# Patient Record
Sex: Male | Born: 1935 | Race: White | Hispanic: No | Smoking: Never smoker
Health system: Southern US, Community
[De-identification: ages and names within clinical notes are randomized; demographics above are authoritative.]

## PROBLEM LIST (undated history)

## (undated) DIAGNOSIS — K5792 Diverticulitis of intestine, part unspecified, without perforation or abscess without bleeding: Secondary | ICD-10-CM

## (undated) DIAGNOSIS — I4819 Other persistent atrial fibrillation: Secondary | ICD-10-CM

## (undated) DIAGNOSIS — I4891 Unspecified atrial fibrillation: Secondary | ICD-10-CM

## (undated) DIAGNOSIS — I340 Nonrheumatic mitral (valve) insufficiency: Secondary | ICD-10-CM

## (undated) DIAGNOSIS — I351 Nonrheumatic aortic (valve) insufficiency: Secondary | ICD-10-CM

## (undated) DIAGNOSIS — C61 Malignant neoplasm of prostate: Secondary | ICD-10-CM

## (undated) DIAGNOSIS — I10 Essential (primary) hypertension: Secondary | ICD-10-CM

## (undated) DIAGNOSIS — F419 Anxiety disorder, unspecified: Secondary | ICD-10-CM

## (undated) DIAGNOSIS — E785 Hyperlipidemia, unspecified: Secondary | ICD-10-CM

## (undated) DIAGNOSIS — I5032 Chronic diastolic (congestive) heart failure: Secondary | ICD-10-CM

## (undated) DIAGNOSIS — N1831 Chronic kidney disease, stage 3a: Secondary | ICD-10-CM

## (undated) DIAGNOSIS — K55069 Acute infarction of intestine, part and extent unspecified: Secondary | ICD-10-CM

## (undated) HISTORY — DX: Hyperlipidemia, unspecified: E78.5

## (undated) HISTORY — DX: Acute infarction of intestine, part and extent unspecified: K55.069

## (undated) HISTORY — DX: Malignant neoplasm of prostate: C61

## (undated) HISTORY — PX: PROSTATE SURGERY: SHX751

## (undated) HISTORY — DX: Anxiety disorder, unspecified: F41.9

## (undated) HISTORY — DX: Diverticulitis of intestine, part unspecified, without perforation or abscess without bleeding: K57.92

## (undated) HISTORY — PX: OTHER SURGICAL HISTORY: SHX169

## (undated) HISTORY — DX: Other persistent atrial fibrillation: I48.19

## (undated) HISTORY — DX: Essential (primary) hypertension: I10

---

## 1998-04-13 ENCOUNTER — Other Ambulatory Visit: Admission: RE | Admit: 1998-04-13 | Discharge: 1998-04-13 | Payer: Self-pay | Admitting: *Deleted

## 2002-02-26 ENCOUNTER — Ambulatory Visit (HOSPITAL_COMMUNITY): Admission: RE | Admit: 2002-02-26 | Discharge: 2002-02-26 | Payer: Self-pay | Admitting: Gastroenterology

## 2004-02-11 ENCOUNTER — Ambulatory Visit (HOSPITAL_COMMUNITY): Admission: RE | Admit: 2004-02-11 | Discharge: 2004-02-11 | Payer: Self-pay | Admitting: *Deleted

## 2006-02-14 ENCOUNTER — Inpatient Hospital Stay (HOSPITAL_COMMUNITY): Admission: EM | Admit: 2006-02-14 | Discharge: 2006-02-19 | Payer: Self-pay | Admitting: *Deleted

## 2006-07-06 ENCOUNTER — Ambulatory Visit: Payer: Self-pay | Admitting: Family Medicine

## 2006-08-03 ENCOUNTER — Ambulatory Visit: Payer: Self-pay | Admitting: Family Medicine

## 2006-08-31 ENCOUNTER — Ambulatory Visit: Payer: Self-pay | Admitting: Family Medicine

## 2006-09-06 ENCOUNTER — Ambulatory Visit: Payer: Self-pay | Admitting: Family Medicine

## 2006-09-18 ENCOUNTER — Ambulatory Visit: Payer: Self-pay | Admitting: Family Medicine

## 2006-09-26 ENCOUNTER — Ambulatory Visit: Payer: Self-pay | Admitting: Family Medicine

## 2006-10-18 ENCOUNTER — Ambulatory Visit: Payer: Self-pay | Admitting: Family Medicine

## 2006-10-24 ENCOUNTER — Ambulatory Visit: Payer: Self-pay | Admitting: Family Medicine

## 2006-11-07 ENCOUNTER — Ambulatory Visit: Payer: Self-pay | Admitting: Family Medicine

## 2006-12-06 ENCOUNTER — Ambulatory Visit: Payer: Self-pay | Admitting: Family Medicine

## 2007-01-15 DIAGNOSIS — F411 Generalized anxiety disorder: Secondary | ICD-10-CM | POA: Insufficient documentation

## 2007-01-15 DIAGNOSIS — Z8719 Personal history of other diseases of the digestive system: Secondary | ICD-10-CM | POA: Insufficient documentation

## 2007-01-15 DIAGNOSIS — F419 Anxiety disorder, unspecified: Secondary | ICD-10-CM | POA: Insufficient documentation

## 2007-01-15 DIAGNOSIS — E785 Hyperlipidemia, unspecified: Secondary | ICD-10-CM | POA: Insufficient documentation

## 2007-01-17 ENCOUNTER — Ambulatory Visit (HOSPITAL_COMMUNITY): Admission: RE | Admit: 2007-01-17 | Discharge: 2007-01-17 | Payer: Self-pay | Admitting: Urology

## 2007-01-24 ENCOUNTER — Ambulatory Visit: Payer: Self-pay | Admitting: Family Medicine

## 2007-01-24 LAB — CONVERTED CEMR LAB: Homocysteine: 11.1 micromoles/L (ref 5.00–13.90)

## 2007-01-29 ENCOUNTER — Ambulatory Visit: Admission: RE | Admit: 2007-01-29 | Discharge: 2007-04-29 | Payer: Self-pay | Admitting: Radiation Oncology

## 2007-01-29 ENCOUNTER — Ambulatory Visit: Payer: Self-pay | Admitting: Family Medicine

## 2007-01-29 LAB — CONVERTED CEMR LAB
Anticardiolipin IgG: <7 (ref <11)
Anticardiolipin IgM: 42 (ref <10)

## 2007-02-27 ENCOUNTER — Ambulatory Visit: Payer: Self-pay | Admitting: Oncology

## 2007-03-01 LAB — CBC WITH DIFFERENTIAL/PLATELET
BASO%: 0.4 % (ref 0.0–2.0)
HCT: 46.6 % (ref 38.7–49.9)
MCHC: 36 g/dL — ABNORMAL HIGH (ref 32.0–35.9)
MONO#: 0.5 10*3/uL (ref 0.1–0.9)
NEUT%: 48.4 % (ref 40.0–75.0)
RDW: 13.3 % (ref 11.2–14.6)
WBC: 4.7 10*3/uL (ref 4.0–10.0)
lymph#: 1.7 10*3/uL (ref 0.9–3.3)

## 2007-03-04 ENCOUNTER — Encounter: Admission: RE | Admit: 2007-03-04 | Discharge: 2007-03-04 | Payer: Self-pay | Admitting: Urology

## 2007-03-06 LAB — HYPERCOAGULABLE PANEL, COMPREHENSIVE
Anticardiolipin IgA: <7 [APL'U] (ref <13)
Anticardiolipin IgG: <7 [GPL'U] (ref <11)
Anticardiolipin IgM: 27 [MPL'U] (ref <10)
DRVVT: 41.9 secs (ref 36.1–47.0)
PTT Lupus Anticoagulant: 62.6 secs — ABNORMAL HIGH (ref 36.3–48.8)
PTTLA 4:1 Mix: 58.8 secs — ABNORMAL HIGH (ref 36.3–48.8)
Protein C Activity: 121 % (ref 91–147)

## 2007-03-06 LAB — COMPREHENSIVE METABOLIC PANEL
ALT: 21 U/L (ref 0–53)
Albumin: 4.8 g/dL (ref 3.5–5.2)
CO2: 24 mEq/L (ref 19–32)
Calcium: 9.7 mg/dL (ref 8.4–10.5)
Chloride: 105 mEq/L (ref 96–112)
Potassium: 4.3 mEq/L (ref 3.5–5.3)
Sodium: 140 mEq/L (ref 135–145)
Total Protein: 7 g/dL (ref 6.0–8.3)

## 2007-03-06 LAB — LACTATE DEHYDROGENASE: LDH: 154 U/L (ref 94–250)

## 2007-03-11 ENCOUNTER — Ambulatory Visit (HOSPITAL_BASED_OUTPATIENT_CLINIC_OR_DEPARTMENT_OTHER): Admission: RE | Admit: 2007-03-11 | Discharge: 2007-03-11 | Payer: Self-pay | Admitting: Urology

## 2007-03-13 DIAGNOSIS — Z8546 Personal history of malignant neoplasm of prostate: Secondary | ICD-10-CM | POA: Insufficient documentation

## 2007-05-07 ENCOUNTER — Encounter: Payer: Self-pay | Admitting: Family Medicine

## 2007-05-07 ENCOUNTER — Encounter (INDEPENDENT_AMBULATORY_CARE_PROVIDER_SITE_OTHER): Payer: Self-pay | Admitting: Family Medicine

## 2007-05-07 ENCOUNTER — Ambulatory Visit: Payer: Self-pay | Admitting: Family Medicine

## 2007-05-07 DIAGNOSIS — I10 Essential (primary) hypertension: Secondary | ICD-10-CM | POA: Insufficient documentation

## 2007-05-07 LAB — CONVERTED CEMR LAB
AST: 43 units/L — ABNORMAL HIGH (ref 0–37)
BUN: 15 mg/dL (ref 6–23)
CO2: 30 meq/L (ref 19–32)
Calcium: 9.2 mg/dL (ref 8.4–10.5)
Creatinine, Ser: 0.9 mg/dL (ref 0.4–1.5)
Potassium: 4.3 meq/L (ref 3.5–5.1)
Triglycerides: 169 mg/dL — ABNORMAL HIGH (ref 0–149)

## 2007-05-08 ENCOUNTER — Telehealth (INDEPENDENT_AMBULATORY_CARE_PROVIDER_SITE_OTHER): Payer: Self-pay | Admitting: *Deleted

## 2007-09-03 ENCOUNTER — Ambulatory Visit: Payer: Self-pay | Admitting: Family Medicine

## 2007-09-04 LAB — CONVERTED CEMR LAB
CO2: 29 meq/L (ref 19–32)
Cholesterol: 169 mg/dL (ref 0–200)
GFR calc Af Amer: 93 mL/min
Glucose, Bld: 98 mg/dL (ref 70–99)
HDL: 40.6 mg/dL (ref >39.0)
Potassium: 4.6 meq/L (ref 3.5–5.1)
Sodium: 140 meq/L (ref 135–145)
Total CHOL/HDL Ratio: 4.2
Triglycerides: 97 mg/dL (ref 0–149)

## 2007-09-05 ENCOUNTER — Telehealth (INDEPENDENT_AMBULATORY_CARE_PROVIDER_SITE_OTHER): Payer: Self-pay | Admitting: *Deleted

## 2007-09-05 ENCOUNTER — Encounter (INDEPENDENT_AMBULATORY_CARE_PROVIDER_SITE_OTHER): Payer: Self-pay | Admitting: Family Medicine

## 2007-10-02 ENCOUNTER — Telehealth (INDEPENDENT_AMBULATORY_CARE_PROVIDER_SITE_OTHER): Payer: Self-pay | Admitting: *Deleted

## 2007-10-02 ENCOUNTER — Ambulatory Visit: Payer: Self-pay | Admitting: Family Medicine

## 2007-10-02 DIAGNOSIS — L259 Unspecified contact dermatitis, unspecified cause: Secondary | ICD-10-CM | POA: Insufficient documentation

## 2007-10-14 ENCOUNTER — Telehealth (INDEPENDENT_AMBULATORY_CARE_PROVIDER_SITE_OTHER): Payer: Self-pay | Admitting: *Deleted

## 2007-11-13 ENCOUNTER — Telehealth (INDEPENDENT_AMBULATORY_CARE_PROVIDER_SITE_OTHER): Payer: Self-pay | Admitting: *Deleted

## 2007-11-14 ENCOUNTER — Ambulatory Visit: Payer: Self-pay | Admitting: Family Medicine

## 2008-01-03 ENCOUNTER — Ambulatory Visit: Payer: Self-pay | Admitting: Family Medicine

## 2008-01-03 LAB — CONVERTED CEMR LAB
ALT: 29 units/L (ref 0–53)
AST: 25 units/L (ref 0–37)
BUN: 11 mg/dL (ref 6–23)
GFR calc Af Amer: 105 mL/min
Potassium: 4.3 meq/L (ref 3.5–5.1)
Sodium: 138 meq/L (ref 135–145)
Total CHOL/HDL Ratio: 5.7
Triglycerides: 187 mg/dL — ABNORMAL HIGH (ref 0–149)
VLDL: 37 mg/dL (ref 0–40)

## 2008-01-06 ENCOUNTER — Encounter (INDEPENDENT_AMBULATORY_CARE_PROVIDER_SITE_OTHER): Payer: Self-pay | Admitting: *Deleted

## 2008-02-06 ENCOUNTER — Encounter (INDEPENDENT_AMBULATORY_CARE_PROVIDER_SITE_OTHER): Payer: Self-pay | Admitting: Family Medicine

## 2008-03-18 ENCOUNTER — Telehealth (INDEPENDENT_AMBULATORY_CARE_PROVIDER_SITE_OTHER): Payer: Self-pay | Admitting: *Deleted

## 2008-03-19 ENCOUNTER — Ambulatory Visit: Payer: Self-pay | Admitting: Family Medicine

## 2008-06-08 ENCOUNTER — Telehealth (INDEPENDENT_AMBULATORY_CARE_PROVIDER_SITE_OTHER): Payer: Self-pay | Admitting: *Deleted

## 2008-07-14 ENCOUNTER — Ambulatory Visit: Payer: Self-pay | Admitting: Family Medicine

## 2008-07-23 ENCOUNTER — Encounter (INDEPENDENT_AMBULATORY_CARE_PROVIDER_SITE_OTHER): Payer: Self-pay | Admitting: *Deleted

## 2008-07-23 LAB — CONVERTED CEMR LAB
Alkaline Phosphatase: 44 units/L (ref 39–117)
Bilirubin, Direct: 0.1 mg/dL (ref 0.0–0.3)
CO2: 29 meq/L (ref 19–32)
GFR calc Af Amer: 105 mL/min
Glucose, Bld: 80 mg/dL (ref 70–99)
Potassium: 4.3 meq/L (ref 3.5–5.1)
Sodium: 142 meq/L (ref 135–145)
Total Bilirubin: 1.5 mg/dL — ABNORMAL HIGH (ref 0.3–1.2)
Total CHOL/HDL Ratio: 5
Total Protein: 6.5 g/dL (ref 6.0–8.3)
VLDL: 27 mg/dL (ref 0–40)

## 2008-09-23 ENCOUNTER — Encounter: Payer: Self-pay | Admitting: Family Medicine

## 2008-09-30 ENCOUNTER — Ambulatory Visit: Payer: Self-pay | Admitting: Family Medicine

## 2008-10-21 ENCOUNTER — Telehealth (INDEPENDENT_AMBULATORY_CARE_PROVIDER_SITE_OTHER): Payer: Self-pay | Admitting: *Deleted

## 2009-02-22 ENCOUNTER — Telehealth (INDEPENDENT_AMBULATORY_CARE_PROVIDER_SITE_OTHER): Payer: Self-pay | Admitting: *Deleted

## 2009-04-20 ENCOUNTER — Telehealth (INDEPENDENT_AMBULATORY_CARE_PROVIDER_SITE_OTHER): Payer: Self-pay | Admitting: *Deleted

## 2009-07-22 ENCOUNTER — Encounter: Payer: Self-pay | Admitting: Family Medicine

## 2009-07-22 ENCOUNTER — Telehealth: Payer: Self-pay | Admitting: Family Medicine

## 2009-07-28 ENCOUNTER — Ambulatory Visit: Payer: Self-pay

## 2009-07-28 ENCOUNTER — Telehealth (INDEPENDENT_AMBULATORY_CARE_PROVIDER_SITE_OTHER): Payer: Self-pay | Admitting: *Deleted

## 2009-07-28 ENCOUNTER — Ambulatory Visit: Payer: Self-pay | Admitting: Family Medicine

## 2009-07-28 DIAGNOSIS — S8010XA Contusion of unspecified lower leg, initial encounter: Secondary | ICD-10-CM | POA: Insufficient documentation

## 2009-07-28 DIAGNOSIS — M79609 Pain in unspecified limb: Secondary | ICD-10-CM | POA: Insufficient documentation

## 2009-07-28 DIAGNOSIS — S93409A Sprain of unspecified ligament of unspecified ankle, initial encounter: Secondary | ICD-10-CM | POA: Insufficient documentation

## 2009-08-02 ENCOUNTER — Telehealth (INDEPENDENT_AMBULATORY_CARE_PROVIDER_SITE_OTHER): Payer: Self-pay | Admitting: *Deleted

## 2009-09-22 ENCOUNTER — Ambulatory Visit: Payer: Self-pay | Admitting: Family Medicine

## 2009-09-22 LAB — CONVERTED CEMR LAB
Bilirubin Urine: NEGATIVE
Blood in Urine, dipstick: NEGATIVE
Glucose, Urine, Semiquant: NEGATIVE
Ketones, urine, test strip: NEGATIVE
Nitrite: NEGATIVE
Protein, U semiquant: NEGATIVE
Specific Gravity, Urine: 1.015
Urobilinogen, UA: 0.2
WBC Urine, dipstick: NEGATIVE
pH: 6

## 2009-09-23 ENCOUNTER — Telehealth: Payer: Self-pay | Admitting: Family Medicine

## 2009-09-23 LAB — CONVERTED CEMR LAB
ALT: 31 units/L (ref 0–53)
Alkaline Phosphatase: 35 units/L — ABNORMAL LOW (ref 39–117)
BUN: 17 mg/dL (ref 6–23)
Basophils Relative: 0.3 % (ref 0.0–3.0)
Bilirubin, Direct: 0 mg/dL (ref 0.0–0.3)
Calcium: 9 mg/dL (ref 8.4–10.5)
Cholesterol: 170 mg/dL (ref 0–200)
Creatinine, Ser: 1 mg/dL (ref 0.4–1.5)
Eosinophils Relative: 2.6 % (ref 0.0–5.0)
GFR calc non Af Amer: 76.57 mL/min (ref >60)
HDL: 29.6 mg/dL — ABNORMAL LOW (ref >39.00)
LDL Cholesterol: 107 mg/dL — ABNORMAL HIGH (ref 0–99)
Lymphocytes Relative: 26.9 % (ref 12.0–46.0)
MCV: 97.5 fL (ref 78.0–100.0)
Monocytes Absolute: 0.6 10*3/uL (ref 0.1–1.0)
Neutrophils Relative %: 58.9 % (ref 43.0–77.0)
Platelets: 162 10*3/uL (ref 150.0–400.0)
RBC: 4.88 M/uL (ref 4.22–5.81)
Total Bilirubin: 1.5 mg/dL — ABNORMAL HIGH (ref 0.3–1.2)
Total CHOL/HDL Ratio: 6
Total Protein: 7.3 g/dL (ref 6.0–8.3)
Triglycerides: 165 mg/dL — ABNORMAL HIGH (ref 0.0–149.0)
VLDL: 33 mg/dL (ref 0.0–40.0)
WBC: 5.5 10*3/uL (ref 4.5–10.5)

## 2009-10-05 ENCOUNTER — Ambulatory Visit: Payer: Self-pay | Admitting: Family Medicine

## 2009-10-05 LAB — CONVERTED CEMR LAB
OCCULT 1: NEGATIVE
OCCULT 2: NEGATIVE

## 2009-12-23 ENCOUNTER — Ambulatory Visit: Payer: Self-pay | Admitting: Family Medicine

## 2009-12-23 ENCOUNTER — Telehealth: Payer: Self-pay | Admitting: Family Medicine

## 2009-12-27 ENCOUNTER — Encounter (INDEPENDENT_AMBULATORY_CARE_PROVIDER_SITE_OTHER): Payer: Self-pay | Admitting: *Deleted

## 2009-12-27 ENCOUNTER — Encounter: Payer: Self-pay | Admitting: Family Medicine

## 2009-12-27 LAB — CONVERTED CEMR LAB
AST: 36 units/L (ref 0–37)
Albumin: 4.3 g/dL (ref 3.5–5.2)
Alkaline Phosphatase: 24 units/L — ABNORMAL LOW (ref 39–117)
Cholesterol: 156 mg/dL (ref 0–200)
HDL: 31.4 mg/dL — ABNORMAL LOW (ref >39.00)
LDL Cholesterol: 99 mg/dL (ref 0–99)
PSA: 0.35 ng/mL
Total Protein: 6.9 g/dL (ref 6.0–8.3)
Triglycerides: 127 mg/dL (ref 0.0–149.0)

## 2010-01-17 ENCOUNTER — Telehealth: Payer: Self-pay | Admitting: Family Medicine

## 2010-01-17 ENCOUNTER — Telehealth (INDEPENDENT_AMBULATORY_CARE_PROVIDER_SITE_OTHER): Payer: Self-pay | Admitting: *Deleted

## 2010-01-27 HISTORY — PX: CATARACT EXTRACTION: SUR2

## 2010-02-02 ENCOUNTER — Encounter: Payer: Self-pay | Admitting: Family Medicine

## 2010-02-07 ENCOUNTER — Encounter: Payer: Self-pay | Admitting: Family Medicine

## 2010-02-23 ENCOUNTER — Telehealth: Payer: Self-pay | Admitting: Family Medicine

## 2010-03-04 HISTORY — PX: CATARACT EXTRACTION: SUR2

## 2010-09-23 ENCOUNTER — Encounter: Payer: Self-pay | Admitting: Family Medicine

## 2010-09-23 ENCOUNTER — Ambulatory Visit: Payer: Self-pay | Admitting: Family Medicine

## 2010-09-23 DIAGNOSIS — H9190 Unspecified hearing loss, unspecified ear: Secondary | ICD-10-CM | POA: Insufficient documentation

## 2010-09-23 LAB — CONVERTED CEMR LAB
Nitrite: NEGATIVE
Specific Gravity, Urine: 1.025
Urobilinogen, UA: NEGATIVE

## 2010-09-26 LAB — CONVERTED CEMR LAB
Basophils Absolute: 0 10*3/uL (ref 0.0–0.1)
Basophils Relative: 0.5 % (ref 0.0–3.0)
Eosinophils Absolute: 0.2 10*3/uL (ref 0.0–0.7)
Lymphocytes Relative: 33.7 % (ref 12.0–46.0)
MCHC: 34.9 g/dL (ref 30.0–36.0)
MCV: 95.2 fL (ref 78.0–100.0)
Monocytes Absolute: 0.5 10*3/uL (ref 0.1–1.0)
Neutrophils Relative %: 50.3 % (ref 43.0–77.0)
Platelets: 184 10*3/uL (ref 150.0–400.0)
RDW: 13.3 % (ref 11.5–14.6)

## 2010-09-28 ENCOUNTER — Ambulatory Visit: Payer: Self-pay | Admitting: Family Medicine

## 2010-09-30 LAB — CONVERTED CEMR LAB: Fecal Occult Bld: NEGATIVE

## 2010-10-19 ENCOUNTER — Encounter: Payer: Self-pay | Admitting: Family Medicine

## 2010-10-26 ENCOUNTER — Telehealth: Payer: Self-pay | Admitting: Family Medicine

## 2010-10-26 ENCOUNTER — Telehealth (INDEPENDENT_AMBULATORY_CARE_PROVIDER_SITE_OTHER): Payer: Self-pay | Admitting: *Deleted

## 2010-10-28 ENCOUNTER — Ambulatory Visit: Payer: Self-pay | Admitting: Family Medicine

## 2010-11-28 ENCOUNTER — Ambulatory Visit (HOSPITAL_BASED_OUTPATIENT_CLINIC_OR_DEPARTMENT_OTHER)
Admission: RE | Admit: 2010-11-28 | Discharge: 2010-11-28 | Payer: Self-pay | Source: Home / Self Care | Admitting: Family Medicine

## 2010-11-28 ENCOUNTER — Ambulatory Visit: Payer: Self-pay | Admitting: Family Medicine

## 2010-11-28 DIAGNOSIS — R233 Spontaneous ecchymoses: Secondary | ICD-10-CM | POA: Insufficient documentation

## 2010-11-28 DIAGNOSIS — S7010XA Contusion of unspecified thigh, initial encounter: Secondary | ICD-10-CM | POA: Insufficient documentation

## 2010-11-29 LAB — CONVERTED CEMR LAB
ALT: 13 units/L (ref 0–53)
Bilirubin, Direct: 0.2 mg/dL (ref 0.0–0.3)
Eosinophils Absolute: 0.1 10*3/uL (ref 0.0–0.7)
INR: 1 (ref <1.50)
Lymphs Abs: 1.4 10*3/uL (ref 0.7–4.0)
MCV: 93.5 fL (ref 78.0–100.0)
Neutro Abs: 4.3 10*3/uL (ref 1.7–7.7)
Neutrophils Relative %: 66 % (ref 43–77)
Platelets: 179 10*3/uL (ref 150–400)
Prothrombin Time: 13.4 s (ref 11.6–15.2)
Total Protein: 6.5 g/dL (ref 6.0–8.3)
WBC: 6.5 10*3/uL (ref 4.0–10.5)
aPTT: 31 s

## 2010-12-05 ENCOUNTER — Ambulatory Visit: Payer: Self-pay | Admitting: Family Medicine

## 2010-12-05 DIAGNOSIS — R609 Edema, unspecified: Secondary | ICD-10-CM | POA: Insufficient documentation

## 2010-12-07 LAB — CONVERTED CEMR LAB
Eosinophils Relative: 1.3 % (ref 0.0–5.0)
GFR calc non Af Amer: 56.9 mL/min — ABNORMAL LOW (ref >60.00)
Glucose, Bld: 123 mg/dL — ABNORMAL HIGH (ref 70–99)
HCT: 38.9 % — ABNORMAL LOW (ref 39.0–52.0)
Hemoglobin: 13.7 g/dL (ref 13.0–17.0)
Lymphs Abs: 1.3 10*3/uL (ref 0.7–4.0)
Monocytes Relative: 9 % (ref 3.0–12.0)
Neutro Abs: 3 10*3/uL (ref 1.4–7.7)
Potassium: 4.6 meq/L (ref 3.5–5.1)
Sodium: 141 meq/L (ref 135–145)
WBC: 4.8 10*3/uL (ref 4.5–10.5)

## 2010-12-22 ENCOUNTER — Ambulatory Visit: Payer: Self-pay | Admitting: Family Medicine

## 2010-12-29 ENCOUNTER — Telehealth: Payer: Self-pay | Admitting: Family Medicine

## 2011-01-24 NOTE — Assessment & Plan Note (Signed)
Summary: [facility] Results//KP   Vital Signs:  Patient profile:   77 year old male Weight:      183.4 pounds Pulse rate:   68 / minute Pulse rhythm:   regular BP sitting:   128 / 68  (left arm) Cuff size:   regular  Vitals Entered By: [name] CMA Duncan Dull) (October 28, 2010 1:55 PM) CC: Review [facility]   History of Present Illness: Pt here to review [facility]  Current Medications (verified): 1)  Clorazepate Dipotassium 15 Mg Tabs (Clorazepate Dipotassium) .... Take 1/2 Tablet By Mouth Once Daily 2)  Cardura 4 Mg Tabs (Doxazosin Mesylate) .[name].. 1 By Mouth Once Daily 3)  Pravachol 40 Mg  Tabs (Pravastatin Sodium) .... 1/2 Tablet Once Daily 4)  Toprol Xl 200 Mg  Xr24h-Tab (Metoprolol Succinate) .... 1/2 Tablet Once Daily 5)  Adult Aspirin Ec Low Strength 81 Mg  Tbec (Aspirin) .... Take One Tablet Daily. 6)  Fish Oil 1000 Mg Caps (Omega-3 Fatty Acids) .... 2 A Day 7)  Fenofibrate 160 Mg Tabs (Fenofibrate) .[name].. 1 By Mouth Daily 8)  Caltrate 600+d Plus 600-400 Mg-Unit Tabs (Calcium Carbonate-Vit D-Min) .[name].. 1 By Mouth Once Daily 9)  B Complex  Tabs (B Complex Vitamins) .[name].. 1 By Mouth Once Daily 10)  Nasalcrom 5.2 Mg/act Aers (Cromolyn Sodium) .... 2 Sprays Two Times A Day  Allergies (verified): 1)  ! Sulfa  Physical Exam  General:  Well-developed,well-nourished,in no acute distress; alert,appropriate and cooperative throughout examination Psych:  Cognition and judgment appear intact. Alert and cooperative with normal attention span and concentration. No apparent delusions, illusions, hallucinations   Impression & Recommendations:  Problem # 1:  HYPERLIPIDEMIA NEC/NOS (ICD-272.4) see [facility]--scanned in emr  His updated medication list for this problem includes:    Zocor 40 Mg Tabs (Simvastatin) .[name]... 1 by mouth at bedtime    Fenofibrate 160 Mg Tabs (Fenofibrate) .[name]... 1 by mouth daily  Labs Reviewed: SGOT: 36 (12/23/2009)   SGPT: 35  (12/23/2009)   HDL:31.40 (12/23/2009), 29.60 (09/22/2009)  LDL:99 (12/23/2009), 107 (62/13/0865)  Chol:156 (12/23/2009), 170 (09/22/2009)  Trig:127.0 (12/23/2009), 165.0 (09/22/2009)  Complete Medication List: 1)  Clorazepate Dipotassium 15 Mg Tabs (Clorazepate dipotassium) .... Take 1/2 tablet by mouth once daily 2)  Cardura 4 Mg Tabs (Doxazosin mesylate) .[name].. 1 by mouth once daily 3)  Zocor 40 Mg Tabs (Simvastatin) .[name].. 1 by mouth at bedtime 4)  Toprol Xl 200 Mg Xr24h-tab (Metoprolol succinate) .... 1/2 tablet once daily 5)  Adult Aspirin Ec Low Strength 81 Mg Tbec (Aspirin) .... Take one tablet daily. 6)  Fish Oil 1000 Mg Caps (Omega-3 fatty acids) .... 2 a day 7)  Fenofibrate 160 Mg Tabs (Fenofibrate) .[name].. 1 by mouth daily 8)  Caltrate 600+d Plus 600-400 Mg-unit Tabs (Calcium carbonate-vit d-min) .[name].. 1 by mouth once daily 9)  B Complex Tabs (B complex vitamins) .[name].. 1 by mouth once daily 10)  Nasalcrom 5.2 Mg/act Aers (Cromolyn sodium) .... 2 sprays two times a day  Patient Instructions: 1)  fasting labs 3 months----272.4   [facility] Prescriptions: ZOCOR 40 MG TABS (SIMVASTATIN) 1 by mouth at bedtime  #30 x 2   Entered and Authorized by:   Loreen Freud DO   Signed by:   Loreen Freud DO on 10/28/2010   Method used:   Electronically to        [id number]       [address]  [place], [place]  [address]       Ph: [phone] or [phone]       Fax: [phone]   RxID:   [id number]    Orders Added: 1)  Est. Patient Level III [[address]]

## 2011-01-24 NOTE — Letter (Signed)
Summary: [facility]: Immunoassay Fecal Occult Blood (iFOB) Order Form  [facility] [place]  [address] [place], [place] [address]   Phone: [phone]  Fax: [phone]      [facility]: Immunoassay Fecal Occult Blood (iFOB) Order Form   September 23, 2010 MRN: [id number]   Mark Stark 05-Mar-1928   Physicican Name: Dr.Lowne  Diagnosis Code: V56.71      [name] CMA (AAMA)

## 2011-01-24 NOTE — Progress Notes (Signed)
Summary: refill  Phone Note Refill Request Message from:  Fax from Pharmacy on February 23, 2010 3:42 PM  Refills Requested: Medication #1:  CLORAZEPATE DIPOTASSIUM 15 MG TABS Take 1/2 tablet by mouth twice a day   Last Refilled: 01/17/2010 [facility] [address] fax [phone]   Method Requested: Fax to Local Pharmacy Next Appointment Scheduled: no appt Initial call taken by: Barb Merino,  February 23, 2010 3:43 PM  Follow-up for Phone Call        last ov- 09.29.10. Army Fossa CMA  February 23, 2010 3:46 PM   Additional Follow-up for Phone Call Additional follow up Details #1::        ok to refill x1----  5 refills Additional Follow-up by: Loreen Freud DO,  February 23, 2010 9:03 PM    Prescriptions: CLORAZEPATE DIPOTASSIUM 15 MG TABS (CLORAZEPATE DIPOTASSIUM) Take 1/2 tablet by mouth twice a day  #30 x 5   Entered by:   Army Fossa CMA   Authorized by:   Loreen Freud DO   Signed by:   Army Fossa CMA on 02/24/2010   Method used:   Printed then faxed to ...       [id number]       [address]       [place], [place]  [address]       Ph: [phone] or [phone]       Fax: [phone]   RxID:   [id number]

## 2011-01-24 NOTE — Letter (Signed)
Summary: Results Follow up Letter  [facility] [place]  [address] [place], [place] [address]   Phone: [phone]  Fax: [phone]    12/27/2009 MRN: [id number]  Mark Stark [address] [place], [place]  [address]  Dear Mr. STEJSKAL,  The following are the results of your recent test(s):  Test         Result    Pap Smear:        Normal _____  Not Normal _____ Comments: ______________________________________________________ Cholesterol: LDL(Bad cholesterol):         Your goal is less than:         HDL (Good cholesterol):       Your goal is more than: Comments:  ______________________________________________________ Mammogram:        Normal _____  Not Normal _____ Comments:  ___________________________________________________________________ Hemoccult:        Normal _____  Not normal _______ Comments:    _____________________________________________________________________ Other Tests:  See attachment for results.  We routinely do not discuss normal results over the telephone.  If you desire a copy of the results, or you have any questions about this information we can discuss them at your next office visit.   Sincerely,    Army Fossa CMA  December 27, 2009 8:46 AM

## 2011-01-24 NOTE — Miscellaneous (Signed)
  Clinical Lists Changes  Observations: Added new observation of PAST SURG HX: Cataract extraction (01/27/2010)--right  (02/07/2010 12:50) Added new observation of PMH CATRCTSG: yes (02/07/2010 12:50) Added new observation of PNEUMVXNEXT: None (02/07/2010 12:50) Added new observation of PSADUE: 12/27/2010 (02/07/2010 12:50) Added new observation of PSA DATE: 12/27/2009 (12/27/2009 12:51) Added new observation of PSA: 0.35 ng/mL (12/27/2009 12:51)     Last PSA Result:  0.38 (09/22/2009 10:40:51 AM) PSA Result Date:  12/27/2009 PSA Result:  0.35 PSA Next Due:  1 yr      Past History:  Past Surgical History: Cataract extraction (01/27/2010)--right

## 2011-01-24 NOTE — Progress Notes (Signed)
Summary: refill   Phone Note Call from Patient   Summary of Call: Pt called and stated that he would like [facility] [address] [facility]. His Clorazepate dipotassium was never receieved at [facility] on 12/23/09.  Is it okay to refill this now? Army Fossa CMA  January 17, 2010 11:49 AM   Follow-up for Phone Call        yes--its ok Follow-up by: Loreen Freud DO,  January 17, 2010 12:29 PM    Prescriptions: CLORAZEPATE DIPOTASSIUM 15 MG TABS (CLORAZEPATE DIPOTASSIUM) Take 1/2 tablet by mouth twice a day  #30 x 0   Entered by:   Army Fossa CMA   Authorized by:   Loreen Freud DO   Signed by:   Army Fossa CMA on 01/17/2010   Method used:   Printed then faxed to ...       [id number]       [address]       [place], [place]  [address]       Ph: [phone] or [phone]       Fax: [phone]   RxID:   [id number]

## 2011-01-24 NOTE — Assessment & Plan Note (Signed)
Summary: lump upper thigh.cbs   Vital Signs:  Patient profile:   77 year old male Weight:      183.6 pounds Pulse rate:   80 / minute Pulse rhythm:   regular BP sitting:   118 / 68  (left arm) Cuff size:   large  Vitals Entered By: [name] CMA Duncan Dull) (November 28, 2010 3:14 PM) CC: x4days c/o lump to the right inner thigh with pain, bruising and swelling from the knee up to the thigh on the same leg---No injury   History of Present Illness: Pt here c/o pain, swelling and bruising R thigh.   No known trauma.    Current Medications (verified): 1)  Clorazepate Dipotassium 15 Mg Tabs (Clorazepate Dipotassium) .... Take 1/2 Tablet By Mouth Once Daily 2)  Cardura 4 Mg Tabs (Doxazosin Mesylate) .[name].. 1 By Mouth Once Daily 3)  Zocor 40 Mg Tabs (Simvastatin) .[name].. 1 By Mouth At Bedtime 4)  Toprol Xl 200 Mg  Xr24h-Tab (Metoprolol Succinate) .... 1/2 Tablet Once Daily 5)  Adult Aspirin Ec Low Strength 81 Mg  Tbec (Aspirin) .... Take One Tablet Daily. 6)  Fish Oil 1000 Mg Caps (Omega-3 Fatty Acids) .... 2 A Day 7)  Fenofibrate 160 Mg Tabs (Fenofibrate) .[name].. 1 By Mouth Daily 8)  Caltrate 600+d Plus 600-400 Mg-Unit Tabs (Calcium Carbonate-Vit D-Min) .[name].. 1 By Mouth Once Daily 9)  B Complex  Tabs (B Complex Vitamins) .[name].. 1 By Mouth Once Daily 10)  Nasalcrom 5.2 Mg/act Aers (Cromolyn Sodium) .... 2 Sprays Two Times A Day  Allergies (verified): 1)  ! Sulfa  Past History:  Past Medical History: Last updated: 01/15/2007 Anxiety Diverticulitis, hx of Hypertension Prostate cancer, hx of  Past Surgical History: Last updated: 09/23/2010 Cataract extraction (01/27/2010)--right Cataract extraction (03/04/2010)--Left  Family History: Last updated: 09/22/2009 mother---alz dementia  Social History: Last updated: 09/03/2007 Occupation: retired professor Married Never Smoked Alcohol use-no Drug use-no  Risk Factors: Alcohol Use: <1 (09/23/2010) Caffeine Use: 0  (09/23/2010) Exercise: yes (09/23/2010)  Risk Factors: Smoking Status: never (09/23/2010)  Family History: Reviewed history from 09/22/2009 and no changes required. mother---alz dementia  Social History: Reviewed history from 09/03/2007 and no changes required. Occupation: retired professor Married Never Smoked Alcohol use-no Drug use-no  Review of Systems      See HPI  Physical Exam  General:  Well-developed,well-nourished,in no acute distress; alert,appropriate and cooperative throughout examination Extremities:  + bruising, swelling and pain with palpation R thigh--groin to knee Psych:  Oriented X3 and normally interactive.     Impression & Recommendations:  Problem # 1:  ECCHYMOSES, SPONTANEOUS (ICD-782.7)  Orders: Venipuncture ([address]) Specimen Handling ([address])  Problem # 2:  LEG PAIN, RIGHT (ICD-729.5)  Orders: Doppler Referral (Doppler)  Complete Medication List: 1)  Clorazepate Dipotassium 15 Mg Tabs (Clorazepate dipotassium) .... Take 1/2 tablet by mouth once daily 2)  Cardura 4 Mg Tabs (Doxazosin mesylate) .[name].. 1 by mouth once daily 3)  Zocor 40 Mg Tabs (Simvastatin) .[name].. 1 by mouth at bedtime 4)  Toprol Xl 200 Mg Xr24h-tab (Metoprolol succinate) .... 1/2 tablet once daily 5)  Adult Aspirin Ec Low Strength 81 Mg Tbec (Aspirin) .... Take one tablet daily. 6)  Fish Oil 1000 Mg Caps (Omega-3 fatty acids) .... 2 a day 7)  Fenofibrate 160 Mg Tabs (Fenofibrate) .[name].. 1 by mouth daily 8)  Caltrate 600+d Plus 600-400 Mg-unit Tabs (Calcium carbonate-vit d-min) .[name].. 1 by mouth once daily 9)  B Complex Tabs (B complex vitamins) .[name].. 1 by  mouth once daily 10)  Nasalcrom 5.2 Mg/act Aers (Cromolyn sodium) .... 2 sprays two times a day  Patient Instructions: 1)  warm compresses 2)  rto if no better 3)  doppler today   Orders Added: 1)  Venipuncture [21308] 2)  Doppler Referral [Doppler] 3)  Specimen Handling [99000] 4)  Est. Patient Level III [[address]]

## 2011-01-24 NOTE — Progress Notes (Signed)
Summary: refill  Phone Note Refill Request Message from:  Fax from Pharmacy on October 26, 2010 3:59 PM  Refills Requested: Medication #1:  CLORAZEPATE DIPOTASSIUM 15 MG TABS Take 1/2 tablet by mouth once daily [facility] [address] [facility] [phone]  Initial call taken by: Okey Regal Spring,  October 26, 2010 4:00 PM  Follow-up for Phone Call        seen for cpx 09/23/10 please advise if it is ok to fill RX Follow-up by: [name] CMA Duncan Dull),  October 27, 2010 3:47 PM  Additional Follow-up for Phone Call Additional follow up Details #1::        refill x1 Additional Follow-up by: Loreen Freud DO,  October 27, 2010 3:52 PM    Prescriptions: CLORAZEPATE DIPOTASSIUM 15 MG TABS (CLORAZEPATE DIPOTASSIUM) Take 1/2 tablet by mouth once daily  #30 x 0   Entered by:   [name] CMA (AAMA)   Authorized by:   Loreen Freud DO   Signed by:   [name] CMA (AAMA) on 10/27/2010   Method used:   Printed then faxed to ...       [facility]       [address]       [place], [place]  [address]       Ph: [phone] or [phone]       Fax: [phone]   RxID:   [id number]

## 2011-01-24 NOTE — Letter (Signed)
Summary: Letter from Patient with Update  Letter from Patient with Concerns   Imported By: Lanelle Bal 02/10/2010 12:41:33  _____________________________________________________________________  External Attachment:    Type:   Image     Comment:   External Document

## 2011-01-24 NOTE — Progress Notes (Signed)
Summary: RX  Phone Note Refill Request   Refills Requested: Medication #1:  FENOFIBRATE 160 MG TABS 1 by mouth daily.. [facility] [phone]  Initial call taken by: Freddy Jaksch,  January 17, 2010 9:07 AM    Prescriptions: FENOFIBRATE 160 MG TABS (FENOFIBRATE) 1 by mouth daily.  #30 Tablet x 5   Entered by:   Army Fossa CMA   Authorized by:   Loreen Freud DO   Signed by:   Army Fossa CMA on 01/17/2010   Method used:   Electronically to        [facility]       [address]       [place], [place]  [address]       Ph: [phone] or [phone]       Fax: [phone]   RxID:   [id number]

## 2011-01-24 NOTE — Progress Notes (Signed)
Summary: appt [phone] to review [facility]  ---- Converted from flag ---- ---- 10/26/2010 4:19 PM, Okey Regal Spring wrote: has appt [phone]  ---- 10/25/2010 10:17 AM, Okey Regal Spring wrote: lmom to schedule appt to review lab  ---- 10/24/2010 11:51 AM, [name] CMA (AAMA) wrote: Please schedule an OV to review labs  ---- 10/24/2010 11:36 AM, Loreen Freud DO wrote: [facility] ------------------------------

## 2011-01-24 NOTE — Assessment & Plan Note (Signed)
Summary: yearly check.cbs   Vital Signs:  Patient profile:   77 year old male Height:      68 inches Weight:      174.2 pounds BMI:     26.58 Temp:     98.3 degrees F oral BP sitting:   120 / 78  (right arm) Cuff size:   regular  Vitals Entered By: [name] CMA Duncan Dull) (September 23, 2010 8:54 AM) CC: cpx/fasting, per pt psa and UA done at the urologist  Does patient need assistance? Functional Status Self care, Cook/clean, Shopping, Social activities Ambulation Normal Comments Pt able to do ADLs and is able to read and write.  Vision Screening:      Vision Comments: optho--vision corrected to 20/20 and 20/30 after cataract surgery and glasses 40db HL: Left  Right  Audiometry Comment: decreased hearing---+ 1 hearing aid    History of Present Illness: Pt here for cpe and labs.  Pt sees urologist and exam and psa done yesterday.  Pt had an episode of diverticulitis while in [place] in May---he was given an abx and symptoms subsided.  No problems since.  Pt was given a diet to follow.    Hyperlipidemia follow-up      This is an 77 year old man who presents for Hyperlipidemia follow-up.  The patient denies muscle aches, GI upset, abdominal pain, flushing, itching, constipation, diarrhea, and fatigue.  The patient denies the following symptoms: chest pain/pressure, exercise intolerance, dypsnea, palpitations, syncope, and pedal edema.  Compliance with medications (by patient report) has been near 100%.  Dietary compliance has been good.  The patient reports exercising occasionally.  Adjunctive measures currently used by the patient include fiber, ASA, and fish oil supplements.    Hypertension follow-up      The patient also presents for Hypertension follow-up.  The patient denies lightheadedness, urinary frequency, headaches, edema, impotence, rash, and fatigue.  The patient denies the following associated symptoms: chest pain, chest pressure, exercise intolerance, dyspnea,  palpitations, syncope, leg edema, and pedal edema.  Compliance with medications (by patient report) has been near 100%.  The patient reports that dietary compliance has been good.  The patient reports exercising occasionally.  Adjunctive measures currently used by the patient include salt restriction.    Preventive Screening-Counseling & Management  Alcohol-Tobacco     Alcohol drinks/day: <1     Alcohol type: wine     Smoking Status: never  Caffeine-Diet-Exercise     Caffeine use/day: 0     Does Patient Exercise: yes     Type of exercise: yard work   Hep-HIV-STD-Contraception     Dental Visit-last 6 months yes     Dental Care Counseling: not indicated; dental care within six months  Safety-Violence-Falls     Seat Belt Use: yes     Seat Belt Counseling: not indicated; patient wears seat belts     Firearms in the Home: no firearms in the home     Firearm Counseling: not applicable     Smoke Detectors: yes     Smoke Detector Counseling: n/a     Violence in the Home: no risk noted     Violence Counseling: not indicated; no violence risk noted     Sexual Abuse: no     Sexual Abuse Counseling: no     Fall Risk: no      Sexual History:  currently monogamous.    Current Medications (verified): 1)  Clorazepate Dipotassium 15 Mg Tabs (Clorazepate Dipotassium) .... Take 1/2  Tablet By Mouth Once Daily 2)  Cardura 4 Mg Tabs (Doxazosin Mesylate) .[name].. 1 By Mouth Once Daily 3)  Pravachol 40 Mg  Tabs (Pravastatin Sodium) .... 1/2 Tablet Once Daily 4)  Toprol Xl 200 Mg  Xr24h-Tab (Metoprolol Succinate) .... 1/2 Tablet Once Daily 5)  Adult Aspirin Ec Low Strength 81 Mg  Tbec (Aspirin) .... Take One Tablet Daily. 6)  Fish Oil 1000 Mg Caps (Omega-3 Fatty Acids) .... 2 A Day 7)  Fenofibrate 160 Mg Tabs (Fenofibrate) .[name].. 1 By Mouth Daily 8)  Caltrate 600+d Plus 600-400 Mg-Unit Tabs (Calcium Carbonate-Vit D-Min) .[name].. 1 By Mouth Once Daily 9)  B Complex  Tabs (B Complex Vitamins) .[name].. 1 By Mouth  Once Daily 10)  Nasalcrom 5.2 Mg/act Aers (Cromolyn Sodium) .... 2 Sprays Two Times A Day  Allergies (verified): 1)  ! Sulfa  Past History:  Past Surgical History: Cataract extraction (01/27/2010)--right Cataract extraction (03/04/2010)--Left  Family History: Reviewed history from 09/22/2009 and no changes required. mother---alz dementia  Social History: Reviewed history from 09/03/2007 and no changes required. Occupation: retired professor Married Never Smoked Alcohol use-no Drug use-no Risk analyst Use:  yes Fall Risk:  no Sexual History:  currently monogamous  Review of Systems      See HPI  Physical Exam  General:  Well-developed,well-nourished,in no acute distress; alert,appropriate and cooperative throughout examination Eyes:  pupils equal, pupils round, pupils reactive to light, and no injection.   Ears:  External ear exam shows no significant lesions or deformities.  Otoscopic examination reveals clear canals, tympanic membranes are intact bilaterally without bulging, retraction, inflammation or discharge. + L hearing aid--- still has decreased hearing Nose:  External nasal examination shows no deformity or inflammation. Nasal mucosa are pink and moist without lesions or exudates. Mouth:  Oral mucosa and oropharynx without lesions or exudates.  Teeth in good repair. Neck:  No deformities, masses, or tenderness noted.no carotid bruits.   Chest Wall:  No deformities, masses, tenderness or gynecomastia noted. Lungs:  Normal respiratory effort, chest expands symmetrically. Lungs are clear to auscultation, no crackles or wheezes. Heart:  normal rate and no murmur.   Abdomen:  Bowel sounds positive,abdomen soft and non-tender without masses, organomegaly or hernias noted. Genitalia:  urology Prostate:  urology Msk:  normal ROM, no joint swelling, no joint warmth, no redness over joints, no joint deformities, no joint instability, no crepitation, and no muscle atrophy.     Pulses:  R posterior tibial normal, R dorsalis pedis normal, R carotid normal, L posterior tibial normal, L dorsalis pedis normal, and L carotid normal.   Extremities:  No clubbing, cyanosis, edema, or deformity noted with normal full range of motion of all joints.   Neurologic:  No cranial nerve deficits noted. Station and gait are normal. Plantar reflexes are down-going bilaterally. DTRs are symmetrical throughout. Sensory, motor and coordinative functions appear intact. Skin:  Intact without suspicious lesions or rashes Cervical Nodes:  No lymphadenopathy noted Axillary Nodes:  No palpable lymphadenopathy Psych:  Cognition and judgment appear intact. Alert and cooperative with normal attention span and concentration. No apparent delusions, illusions, hallucinations   Impression & Recommendations:  Problem # 1:  WELL ADULT EXAM (ICD-V70.0)  ghm utd check fasting labs Reviewed preventive care protocols, scheduled due services, and updated immunizations.  Orders: Medicare -1st Annual Wellness Visit [phone]) EKG w/ Interpretation (93000)  Problem # 2:  DECREASED HEARING, BILATERAL (ICD-389.9) pt with f/u with audiologist  Problem # 3:  HYPERLIPIDEMIA NEC/NOS (ICD-272.4)  His updated  medication list for this problem includes:    Pravachol 40 Mg Tabs (Pravastatin sodium) .[name]... 1/2 tablet once daily    Fenofibrate 160 Mg Tabs (Fenofibrate) .[name]... 1 by mouth daily  Labs Reviewed: SGOT: 36 (12/23/2009)   SGPT: 35 (12/23/2009)   HDL:31.40 (12/23/2009), 29.60 (09/22/2009)  LDL:99 (12/23/2009), 107 (95/62/1308)  Chol:156 (12/23/2009), 170 (09/22/2009)  Trig:127.0 (12/23/2009), 165.0 (09/22/2009)  Problem # 4:  HYPERTENSION (ICD-401.9)  His updated medication list for this problem includes:    Cardura 4 Mg Tabs (Doxazosin mesylate) .[name]... 1 by mouth once daily    Toprol Xl 200 Mg Xr24h-tab (Metoprolol succinate) .[name]... 1/2 tablet once daily  BP today: 120/78 Prior BP: 158/82  (09/22/2009)  Labs Reviewed: K+: 4.3 (09/22/2009) Creat: : 1.0 (09/22/2009)   Chol: 156 (12/23/2009)   HDL: 31.40 (12/23/2009)   LDL: 99 (12/23/2009)   TG: 127.0 (12/23/2009)  Problem # 5:  PROSTATE CANCER, HX OF (ICD-V10.46) per urology  Complete Medication List: 1)  Clorazepate Dipotassium 15 Mg Tabs (Clorazepate dipotassium) .... Take 1/2 tablet by mouth once daily 2)  Cardura 4 Mg Tabs (Doxazosin mesylate) .[name].. 1 by mouth once daily 3)  Pravachol 40 Mg Tabs (Pravastatin sodium) .... 1/2 tablet once daily 4)  Toprol Xl 200 Mg Xr24h-tab (Metoprolol succinate) .... 1/2 tablet once daily 5)  Adult Aspirin Ec Low Strength 81 Mg Tbec (Aspirin) .... Take one tablet daily. 6)  Fish Oil 1000 Mg Caps (Omega-3 fatty acids) .... 2 a day 7)  Fenofibrate 160 Mg Tabs (Fenofibrate) .[name].. 1 by mouth daily 8)  Caltrate 600+d Plus 600-400 Mg-unit Tabs (Calcium carbonate-vit d-min) .[name].. 1 by mouth once daily 9)  B Complex Tabs (B complex vitamins) .[name].. 1 by mouth once daily 10)  Nasalcrom 5.2 Mg/act Aers (Cromolyn sodium) .... 2 sprays two times a day  Other Orders: Venipuncture (65784) TLB-CBC Platelet - w/Differential (85025-CBCD) T- * Misc. Laboratory test [phone]) Specimen Handling (52841) UA Dipstick W/ Micro (manual) (81000)  Patient Instructions: 1)  Please schedule a follow-up appointment in 2 weeks.  Prescriptions: CARDURA 4 MG TABS (DOXAZOSIN MESYLATE) 1 by mouth once daily  #90 x 3   Entered and Authorized by:   Loreen Freud DO   Signed by:   Loreen Freud DO on 09/23/2010   Method used:   Historical   RxID:   [id number] FENOFIBRATE 160 MG TABS (FENOFIBRATE) 1 by mouth daily  #90 x 3   Entered and Authorized by:   Loreen Freud DO   Signed by:   Loreen Freud DO on 09/23/2010   Method used:   Electronically to        [id number] (retail)       [address]       [place], [place]  [address]       Ph: [phone] or [phone]       Fax: [phone]   RxID:    [id number]   Last Flu Vaccine:  given (09/08/2009 10:31:21 AM) Flu Vaccine Result Date:  08/31/2010 Flu Vaccine Result:  given Flu Vaccine Next Due:  1 yr   Laboratory Results   Urine Tests   Date/Time Reported: September 23, 2010 10:08 AM   Routine Urinalysis   Color: yellow Appearance: Clear Glucose: negative   (Normal Range: Negative) Bilirubin: negative   (Normal Range: Negative) Ketone: negative   (Normal Range: Negative) Spec. Gravity: 1.025   (Normal Range: 1.003-1.035) Blood: negative   (Normal Range: Negative) pH: 6.0   (Normal Range: 5.0-8.0) Protein:  negative   (Normal Range: Negative) Urobilinogen: negative   (Normal Range: 0-1) Nitrite: negative   (Normal Range: Negative) Leukocyte Esterace: negative   (Normal Range: Negative)    Comments: Floydene Flock  September 23, 2010 10:08 AM      Appended Document: yearly check.cbs     Allergies: 1)  ! Sulfa  Physical Exam  Psych:  not anxious appearing, not depressed appearing, not suicidal, and not homicidal.     Complete Medication List: 1)  Clorazepate Dipotassium 15 Mg Tabs (Clorazepate dipotassium) .... Take 1/2 tablet by mouth once daily 2)  Cardura 4 Mg Tabs (Doxazosin mesylate) .[name].. 1 by mouth once daily 3)  Pravachol 40 Mg Tabs (Pravastatin sodium) .... 1/2 tablet once daily 4)  Toprol Xl 200 Mg Xr24h-tab (Metoprolol succinate) .... 1/2 tablet once daily 5)  Adult Aspirin Ec Low Strength 81 Mg Tbec (Aspirin) .... Take one tablet daily. 6)  Fish Oil 1000 Mg Caps (Omega-3 fatty acids) .... 2 a day 7)  Fenofibrate 160 Mg Tabs (Fenofibrate) .[name].. 1 by mouth daily 8)  Caltrate 600+d Plus 600-400 Mg-unit Tabs (Calcium carbonate-vit d-min) .[name].. 1 by mouth once daily 9)  B Complex Tabs (B complex vitamins) .[name].. 1 by mouth once daily 10)  Nasalcrom 5.2 Mg/act Aers (Cromolyn sodium) .... 2 sprays two times a day  Appended Document: yearly check.cbs    Clinical Lists Changes

## 2011-01-26 NOTE — Assessment & Plan Note (Signed)
Summary: RECHECK UPPER LEG BRUISE/CBS   Vital Signs:  Patient profile:   77 year old male Weight:      185 pounds BMI:     28.23 Pulse rate:   74 / minute BP sitting:   132 / 74  (left arm)  Vitals Entered By: Doristine Devoid CMA (December 22, 2010 8:51 AM) CC: recheck leg- feels much better   History of Present Illness: 77 yo man w/ large hematoma/ecchymosis of R leg.  pt reports leg is much improved.  bruising has almost completely resolved w/ just a little residual in the foot.  still has firm area on thigh that is tender to touch but this is also smaller.   Current Medications (verified): 1)  Clorazepate Dipotassium 15 Mg Tabs (Clorazepate Dipotassium) .... Take 1/2 Tablet By Mouth Once Daily 2)  Cardura 4 Mg Tabs (Doxazosin Mesylate) .[name].. 1 By Mouth Once Daily 3)  Zocor 40 Mg Tabs (Simvastatin) .[name].. 1 By Mouth At Bedtime 4)  Toprol Xl 200 Mg  Xr24h-Tab (Metoprolol Succinate) .... 1/2 Tablet Once Daily 5)  Adult Aspirin Ec Low Strength 81 Mg  Tbec (Aspirin) .... Take One Tablet Daily. 6)  Fish Oil 1000 Mg Caps (Omega-3 Fatty Acids) .... 2 A Day 7)  Fenofibrate 160 Mg Tabs (Fenofibrate) .[name].. 1 By Mouth Daily 8)  Caltrate 600+d Plus 600-400 Mg-Unit Tabs (Calcium Carbonate-Vit D-Min) .[name].. 1 By Mouth Once Daily 9)  B Complex  Tabs (B Complex Vitamins) .[name].. 1 By Mouth Once Daily 10)  Nasalcrom 5.2 Mg/act Aers (Cromolyn Sodium) .... 2 Sprays Two Times A Day 11)  Aldactone 25 Mg Tabs (Spironolactone) .[name].. 1 By Mouth Once Daily As Needed Edema  Allergies (verified): 1)  ! Sulfa  Review of Systems      See HPI  Physical Exam  General:  Well-developed,well-nourished,in no acute distress; alert,appropriate and cooperative throughout examination Pulses:  +2 DP/PT Extremities:  no bruising of R thigh or lower leg.  faint bruising of R foot remain.  no pain.  still w/ area of firmness in R thigh- no tenderness to palpation in office.   Impression & Recommendations:  Problem # 1:   ECCHYMOSES, SPONTANEOUS (ICD-782.7) Assessment Improved resolving.  pt has stopped the diuretic as he no longer has edema.  advised continued heat on hematoma of R thigh to help w/ resorbtion.  Pt expresses understanding and is in agreement w/ this plan.  Problem # 2:  EDEMA (ICD-782.3) Assessment: Improved see above The following medications were removed from the medication list:    Aldactone 25 Mg Tabs (Spironolactone) .[name]... 1 by mouth once daily as needed edema  Complete Medication List: 1)  Clorazepate Dipotassium 15 Mg Tabs (Clorazepate dipotassium) .... Take 1/2 tablet by mouth once daily 2)  Cardura 4 Mg Tabs (Doxazosin mesylate) .[name].. 1 by mouth once daily 3)  Zocor 40 Mg Tabs (Simvastatin) .[name].. 1 by mouth at bedtime 4)  Toprol Xl 200 Mg Xr24h-tab (Metoprolol succinate) .... 1/2 tablet once daily 5)  Adult Aspirin Ec Low Strength 81 Mg Tbec (Aspirin) .... Take one tablet daily. 6)  Fish Oil 1000 Mg Caps (Omega-3 fatty acids) .... 2 a day 7)  Fenofibrate 160 Mg Tabs (Fenofibrate) .[name].. 1 by mouth daily 8)  Caltrate 600+d Plus 600-400 Mg-unit Tabs (Calcium carbonate-vit d-min) .[name].. 1 by mouth once daily 9)  B Complex Tabs (B complex vitamins) .[name].. 1 by mouth once daily 10)  Nasalcrom 5.2 Mg/act Aers (Cromolyn sodium) .... 2 sprays two times  a day  Patient Instructions: 1)  Your leg looks great! 2)  Continue to heat the firm, tender area 3)  Call with any questions or concerns 4)  Happy New Year!!!   Orders Added: 1)  Est. Patient Level III [[address]]

## 2011-01-26 NOTE — Progress Notes (Signed)
Summary: Refill Request  Phone Note Refill Request Call back at [phone] Message from:  Pharmacy on December 29, 2010 8:16 AM  Refills Requested: Medication #1:  CLORAZEPATE DIPOTASSIUM 15 MG TABS Take 1/2 tablet by mouth once daily   Dosage confirmed as above?Dosage Confirmed   Supply Requested: 1 month   [facility] [address]   Next Appointment Scheduled: 2.7.12 (lab) Initial call taken by: Harold Barban,  December 29, 2010 8:17 AM  Follow-up for Phone Call        last seen 12/22/10 and filled 10/27/10.Marland KitchenMarland Kitchenplease advise Follow-up by: [name] CMA (AAMA),  December 29, 2010 9:00 AM  Additional Follow-up for Phone Call Additional follow up Details #1::        refill x1  2 refills Additional Follow-up by: Loreen Freud DO,  December 29, 2010 9:51 AM    Prescriptions: CLORAZEPATE DIPOTASSIUM 15 MG TABS (CLORAZEPATE DIPOTASSIUM) Take 1/2 tablet by mouth once daily  #30 x 2   Entered by:   [name] CMA (AAMA)   Authorized by:   Loreen Freud DO   Signed by:   [name] CMA (AAMA) on 12/29/2010   Method used:   Printed then faxed to ...       [id number]       [address]       [place], [place]  [address]       Ph: [phone] or [phone]       Fax: [phone]   RxID:   [id number]

## 2011-01-26 NOTE — Assessment & Plan Note (Signed)
Summary: LEG IS STILL HURT/KN   Vital Signs:  Patient profile:   77 year old male Weight:      181.0 pounds Temp:     97.1 degrees F oral BP sitting:   126 / 60  (right arm) Cuff size:   large  Vitals Entered By: [name] CMA Duncan Dull) (December 05, 2010 2:57 PM) CC: swelling to the right leg has gotten worst-- foot swollen and he is unable to wear shoes   History of Present Illness: Pt here f/u ecchymosis R leg. It is worse---- going down leg to foot and swelling  No pain with elevation and rest.  Current Medications (verified): 1)  Clorazepate Dipotassium 15 Mg Tabs (Clorazepate Dipotassium) .... Take 1/2 Tablet By Mouth Once Daily 2)  Cardura 4 Mg Tabs (Doxazosin Mesylate) .[name].. 1 By Mouth Once Daily 3)  Zocor 40 Mg Tabs (Simvastatin) .[name].. 1 By Mouth At Bedtime 4)  Toprol Xl 200 Mg  Xr24h-Tab (Metoprolol Succinate) .... 1/2 Tablet Once Daily 5)  Adult Aspirin Ec Low Strength 81 Mg  Tbec (Aspirin) .... Take One Tablet Daily. 6)  Fish Oil 1000 Mg Caps (Omega-3 Fatty Acids) .... 2 A Day 7)  Fenofibrate 160 Mg Tabs (Fenofibrate) .[name].. 1 By Mouth Daily 8)  Caltrate 600+d Plus 600-400 Mg-Unit Tabs (Calcium Carbonate-Vit D-Min) .[name].. 1 By Mouth Once Daily 9)  B Complex  Tabs (B Complex Vitamins) .[name].. 1 By Mouth Once Daily 10)  Nasalcrom 5.2 Mg/act Aers (Cromolyn Sodium) .... 2 Sprays Two Times A Day  Allergies (verified): 1)  ! Sulfa  Physical Exam  General:  Well-developed,well-nourished,in no acute distress; alert,appropriate and cooperative throughout examination Extremities:  R leg ---+ ecchymosis going down R leg to foot with swelling in low leg and foot see last ov Psych:  Oriented X3 and normally interactive.     Impression & Recommendations:  Problem # 1:  ECCHYMOSES, SPONTANEOUS (ICD-782.7) Assessment Deteriorated doppler neg for clot warm compresses  elevate ext Orders: Venipuncture ([address]) TLB-BMP (Basic Metabolic Panel-BMET) (80048-METABOL) TLB-CBC Platelet  - w/Differential (85025-CBCD) TLB-CK Total Only(Creatine Kinase/CPK) (82550-CK) Specimen Handling (60454)  Problem # 2:  LEG PAIN, RIGHT (ICD-729.5) Assessment: Improved  Orders: Venipuncture (09811) TLB-BMP (Basic Metabolic Panel-BMET) (80048-METABOL) TLB-CBC Platelet - w/Differential (85025-CBCD) TLB-CK Total Only(Creatine Kinase/CPK) (82550-CK) Specimen Handling (91478)  Complete Medication List: 1)  Clorazepate Dipotassium 15 Mg Tabs (Clorazepate dipotassium) .... Take 1/2 tablet by mouth once daily 2)  Cardura 4 Mg Tabs (Doxazosin mesylate) .[name].. 1 by mouth once daily 3)  Zocor 40 Mg Tabs (Simvastatin) .[name].. 1 by mouth at bedtime 4)  Toprol Xl 200 Mg Xr24h-tab (Metoprolol succinate) .... 1/2 tablet once daily 5)  Adult Aspirin Ec Low Strength 81 Mg Tbec (Aspirin) .... Take one tablet daily. 6)  Fish Oil 1000 Mg Caps (Omega-3 fatty acids) .... 2 a day 7)  Fenofibrate 160 Mg Tabs (Fenofibrate) .[name].. 1 by mouth daily 8)  Caltrate 600+d Plus 600-400 Mg-unit Tabs (Calcium carbonate-vit d-min) .[name].. 1 by mouth once daily 9)  B Complex Tabs (B complex vitamins) .[name].. 1 by mouth once daily 10)  Nasalcrom 5.2 Mg/act Aers (Cromolyn sodium) .... 2 sprays two times a day 11)  Aldactone 25 Mg Tabs (Spironolactone) .[name].. 1 by mouth once daily as needed edema Prescriptions: ALDACTONE 25 MG TABS (SPIRONOLACTONE) 1 by mouth once daily as needed edema  #30 x 0   Entered and Authorized by:   Loreen Freud DO   Signed by:   Loreen Freud DO on  12/05/2010   Method used:   Electronically to        [id number]       [address]       [place], [place]  [address]       Ph: [phone] or [phone]       Fax: [phone]   RxID:   [id number]    Orders Added: 1)  Venipuncture [27253] 2)  TLB-BMP (Basic Metabolic Panel-BMET) [80048-METABOL] 3)  TLB-CBC Platelet - w/Differential [85025-CBCD] 4)  TLB-CK Total Only(Creatine Kinase/CPK) [82550-CK] 5)  Specimen Handling [99000] 6)   Est. Patient Level III [66440]

## 2011-01-31 ENCOUNTER — Other Ambulatory Visit (INDEPENDENT_AMBULATORY_CARE_PROVIDER_SITE_OTHER): Payer: Medicare Other

## 2011-01-31 ENCOUNTER — Encounter: Payer: Self-pay | Admitting: Family Medicine

## 2011-01-31 ENCOUNTER — Encounter (INDEPENDENT_AMBULATORY_CARE_PROVIDER_SITE_OTHER): Payer: Self-pay | Admitting: *Deleted

## 2011-01-31 DIAGNOSIS — E785 Hyperlipidemia, unspecified: Secondary | ICD-10-CM

## 2011-02-19 ENCOUNTER — Encounter: Payer: Self-pay | Admitting: Family Medicine

## 2011-02-21 ENCOUNTER — Ambulatory Visit (INDEPENDENT_AMBULATORY_CARE_PROVIDER_SITE_OTHER): Payer: Medicare Other | Admitting: Family Medicine

## 2011-02-21 ENCOUNTER — Encounter: Payer: Self-pay | Admitting: Family Medicine

## 2011-02-21 DIAGNOSIS — E785 Hyperlipidemia, unspecified: Secondary | ICD-10-CM

## 2011-02-21 DIAGNOSIS — I10 Essential (primary) hypertension: Secondary | ICD-10-CM

## 2011-03-02 ENCOUNTER — Ambulatory Visit (HOSPITAL_COMMUNITY): Payer: Medicare Other | Attending: Family Medicine

## 2011-03-02 DIAGNOSIS — R609 Edema, unspecified: Secondary | ICD-10-CM | POA: Insufficient documentation

## 2011-03-02 DIAGNOSIS — E785 Hyperlipidemia, unspecified: Secondary | ICD-10-CM | POA: Insufficient documentation

## 2011-03-02 DIAGNOSIS — Z8546 Personal history of malignant neoplasm of prostate: Secondary | ICD-10-CM | POA: Insufficient documentation

## 2011-03-02 DIAGNOSIS — I059 Rheumatic mitral valve disease, unspecified: Secondary | ICD-10-CM | POA: Insufficient documentation

## 2011-03-02 DIAGNOSIS — I10 Essential (primary) hypertension: Secondary | ICD-10-CM | POA: Insufficient documentation

## 2011-03-02 DIAGNOSIS — I517 Cardiomegaly: Secondary | ICD-10-CM | POA: Insufficient documentation

## 2011-03-02 NOTE — Assessment & Plan Note (Signed)
Summary: Review [facility] nd get Echo Information//KP   Vital Signs:  Patient profile:   77 year old male Height:      68 inches Weight:      182 pounds Temp:     98.0 degrees F oral Pulse rate:   68 / minute BP sitting:   150 / 80  (left arm)  Vitals Entered By: Jeremy Johann CMA (February 21, 2011 9:15 AM) CC: [facility]   History of Present Illness: Pt here to review labs.   Pt denies any cp, sob or swelling in ext.    Problems Prior to Update: 1)  Edema  (ICD-782.3) 2)  Ecchymoses, Spontaneous  (ICD-782.7) 3)  Leg Pain, Right  (ICD-729.5) 4)  Contusion, Right Thigh  (ICD-924.00) 5)  Decreased Hearing, Bilateral  (ICD-389.9) 6)  Contusion, Lower Leg, Left  (ICD-924.10) 7)  Ankle Sprain, Left  (ICD-845.00) 8)  Calf Pain, Left  (ICD-729.5) 9)  Dermatitis, Contact, Nos  (ICD-692.9) 10)  Well Adult Exam  (ICD-V70.0) 11)  Screening For Viral Disease Nos  (ICD-V73.99) 12)  Prostate Cancer, Hx of  (ICD-V10.46) 13)  Hyperlipidemia Nec/nos  (ICD-272.4) 14)  Hypertension  (ICD-401.9) 15)  Diverticulitis, Hx of  (ICD-V12.79) 16)  Anxiety  (ICD-300.00)  Medications Prior to Update: 1)  Clorazepate Dipotassium 15 Mg Tabs (Clorazepate Dipotassium) .... Take 1/2 Tablet By Mouth Once Daily 2)  Cardura 4 Mg Tabs (Doxazosin Mesylate) .[name].. 1 By Mouth Once Daily 3)  Zocor 40 Mg Tabs (Simvastatin) .[name].. 1 By Mouth At Bedtime 4)  Toprol Xl 200 Mg  Xr24h-Tab (Metoprolol Succinate) .... 1/2 Tablet Once Daily 5)  Adult Aspirin Ec Low Strength 81 Mg  Tbec (Aspirin) .... Take One Tablet Daily. 6)  Fish Oil 1000 Mg Caps (Omega-3 Fatty Acids) .... 2 A Day 7)  Fenofibrate 160 Mg Tabs (Fenofibrate) .[name].. 1 By Mouth Daily 8)  Caltrate 600+d Plus 600-400 Mg-Unit Tabs (Calcium Carbonate-Vit D-Min) .[name].. 1 By Mouth Once Daily 9)  B Complex  Tabs (B Complex Vitamins) .[name].. 1 By Mouth Once Daily 10)  Nasalcrom 5.2 Mg/act Aers (Cromolyn Sodium) .... 2 Sprays Two Times A Day  Current  Medications (verified): 1)  Clorazepate Dipotassium 15 Mg Tabs (Clorazepate Dipotassium) .... Take 1/2 Tablet By Mouth Once Daily 2)  Cardura 4 Mg Tabs (Doxazosin Mesylate) .[name].. 1 By Mouth Once Daily 3)  Zocor 40 Mg Tabs (Simvastatin) .[name].. 1 By Mouth At Bedtime 4)  Toprol Xl 200 Mg  Xr24h-Tab (Metoprolol Succinate) .... 1/2 Tablet Once Daily 5)  Adult Aspirin Ec Low Strength 81 Mg  Tbec (Aspirin) .... Take One Tablet Daily. 6)  Fish Oil 1000 Mg Caps (Omega-3 Fatty Acids) .... 2 A Day 7)  Fenofibrate 160 Mg Tabs (Fenofibrate) .[name].. 1 By Mouth Daily 8)  Caltrate 600+d Plus 600-400 Mg-Unit Tabs (Calcium Carbonate-Vit D-Min) .[name].. 1 By Mouth Once Daily 9)  B Complex  Tabs (B Complex Vitamins) .[name].. 1 By Mouth Once Daily 10)  Nasalcrom 5.2 Mg/act Aers (Cromolyn Sodium) .... 2 Sprays Two Times A Day  Allergies (verified): 1)  ! Sulfa  Past History:  Past Medical History: Last updated: 01/15/2007 Anxiety Diverticulitis, hx of Hypertension Prostate cancer, hx of  Past Surgical History: Last updated: 09/23/2010 Cataract extraction (01/27/2010)--right Cataract extraction (03/04/2010)--Left  Family History: Last updated: 09/22/2009 mother---alz dementia  Social History: Last updated: 09/03/2007 Occupation: retired professor Married Never Smoked Alcohol use-no Drug use-no  Risk Factors: Alcohol Use: <1 (09/23/2010) Caffeine Use: 0 (09/23/2010) Exercise:  yes (09/23/2010)  Risk Factors: Smoking Status: never (09/23/2010)  Family History: Reviewed history from 09/22/2009 and no changes required. mother---alz dementia  Social History: Reviewed history from 09/03/2007 and no changes required. Occupation: retired professor Married Never Smoked Alcohol use-no Drug use-no  Review of Systems      See HPI  Physical Exam  General:  Well-developed,well-nourished,in no acute distress; alert,appropriate and cooperative throughout examination Psych:  Oriented X3 and normally  interactive.     Impression & Recommendations:  Problem # 1:  HYPERTENSION (ICD-401.9)  His updated medication list for this problem includes:    Cardura 4 Mg Tabs (Doxazosin mesylate) .[name]... 1 by mouth once daily    Toprol Xl 200 Mg Xr24h-tab (Metoprolol succinate) .[name]... 1/2 tablet once daily  BP today: 150/80 Prior BP: 132/74 (12/22/2010)  Labs Reviewed: K+: 4.6 (12/05/2010) Creat: : 1.3 (12/05/2010)   Chol: 156 (12/23/2009)   HDL: 31.40 (12/23/2009)   LDL: 99 (12/23/2009)   TG: 127.0 (12/23/2009)  Problem # 2:  HYPERLIPIDEMIA NEC/NOS (ICD-272.4)  His updated medication list for this problem includes:    Zocor 40 Mg Tabs (Simvastatin) .[name]... 1 by mouth at bedtime    Fenofibrate 160 Mg Tabs (Fenofibrate) .[name]... 1 by mouth daily  Labs Reviewed: SGOT: 16 (11/28/2010)   SGPT: 13 (11/28/2010)   HDL:31.40 (12/23/2009), 29.60 (09/22/2009)  LDL:99 (12/23/2009), 107 (16/09/9603)  Chol:156 (12/23/2009), 170 (09/22/2009)  Trig:127.0 (12/23/2009), 165.0 (09/22/2009)  Complete Medication List: 1)  Clorazepate Dipotassium 15 Mg Tabs (Clorazepate dipotassium) .... Take 1/2 tablet by mouth once daily 2)  Cardura 4 Mg Tabs (Doxazosin mesylate) .[name].. 1 by mouth once daily 3)  Zocor 40 Mg Tabs (Simvastatin) .[name].. 1 by mouth at bedtime 4)  Toprol Xl 200 Mg Xr24h-tab (Metoprolol succinate) .... 1/2 tablet once daily 5)  Adult Aspirin Ec Low Strength 81 Mg Tbec (Aspirin) .... Take one tablet daily. 6)  Fish Oil 1000 Mg Caps (Omega-3 fatty acids) .... 2 a day 7)  Fenofibrate 160 Mg Tabs (Fenofibrate) .[name].. 1 by mouth daily 8)  Caltrate 600+d Plus 600-400 Mg-unit Tabs (Calcium carbonate-vit d-min) .[name].. 1 by mouth once daily 9)  B Complex Tabs (B complex vitamins) .[name].. 1 by mouth once daily 10)  Nasalcrom 5.2 Mg/act Aers (Cromolyn sodium) .... 2 sprays two times a day  Patient Instructions: 1)  RTO CPE in September 2)  You will receive a phone call about your ECHO   Orders Added: 1)  Est. Patient  Level III [54098]

## 2011-03-02 NOTE — Miscellaneous (Signed)
Summary: Orders Update  Clinical Lists Changes  Orders: Added new Referral order of Echo Referral (Echo) - Signed 

## 2011-03-05 DIAGNOSIS — R011 Cardiac murmur, unspecified: Secondary | ICD-10-CM | POA: Insufficient documentation

## 2011-03-15 ENCOUNTER — Encounter: Payer: Self-pay | Admitting: Cardiology

## 2011-03-16 ENCOUNTER — Encounter: Payer: Self-pay | Admitting: Cardiology

## 2011-03-27 ENCOUNTER — Ambulatory Visit (INDEPENDENT_AMBULATORY_CARE_PROVIDER_SITE_OTHER): Payer: Medicare Other | Admitting: Cardiology

## 2011-03-27 ENCOUNTER — Other Ambulatory Visit: Payer: Self-pay | Admitting: Family Medicine

## 2011-03-27 ENCOUNTER — Other Ambulatory Visit: Payer: Self-pay | Admitting: *Deleted

## 2011-03-27 ENCOUNTER — Encounter: Payer: Self-pay | Admitting: Cardiology

## 2011-03-27 VITALS — BP 130/80 | HR 62 | Ht 68.0 in | Wt 184.0 lb

## 2011-03-27 DIAGNOSIS — R899 Unspecified abnormal finding in specimens from other organs, systems and tissues: Secondary | ICD-10-CM | POA: Insufficient documentation

## 2011-03-27 DIAGNOSIS — R6889 Other general symptoms and signs: Secondary | ICD-10-CM

## 2011-03-27 DIAGNOSIS — I359 Nonrheumatic aortic valve disorder, unspecified: Secondary | ICD-10-CM

## 2011-03-27 DIAGNOSIS — E785 Hyperlipidemia, unspecified: Secondary | ICD-10-CM

## 2011-03-27 DIAGNOSIS — I10 Essential (primary) hypertension: Secondary | ICD-10-CM

## 2011-03-27 DIAGNOSIS — I351 Nonrheumatic aortic (valve) insufficiency: Secondary | ICD-10-CM | POA: Insufficient documentation

## 2011-03-27 MED ORDER — SIMVASTATIN 40 MG PO TABS: 40.0000 mg | ORAL_TABLET | Freq: Every day | ORAL | Status: DC

## 2011-03-27 NOTE — Assessment & Plan Note (Signed)
He has some very mild valvular disease is not contributing to any symptoms. This can be followed clinically.

## 2011-03-27 NOTE — Patient Instructions (Signed)
Follow up as needed

## 2011-03-27 NOTE — Assessment & Plan Note (Signed)
The blood pressure is at target. No change in medications is indicated. We will continue with therapeutic lifestyle changes (TLC).  

## 2011-03-27 NOTE — Progress Notes (Signed)
HPI The patient presents for evaluation of a heart murmur and an abnormal BNP. He has no prior cardiac history. He actually is a well-year-old who is quite active. When his level of activity to include regular exercise pushing a lawnmower hours at a time he denies any chest pressure, neck or. And he has no palpitations, presyncope or syncope. He denies any shortness of breath, PND or orthopnea. He has had no weight gain or edema. He has had no prior cardiac testing until recently.  He was noted apparently to have a slight systolic heart murmur. He had a pro BNP that was slightly elevated on routine screening labs. He has had some high blood pressure diabetes control. Echo demonstrated a near 60% with mild aortic insufficiency and mitral regurgitation. There was also mild left ventricular hypertrophy.  Current Outpatient Prescriptions  Medication Sig Dispense Refill  . aspirin 81 MG tablet Take 81 mg by mouth daily.        [name] b complex vitamins tablet Take 1 tablet by mouth daily.        . Calcium Carbonate-Vitamin D (CALTRATE 600+D) 600-400 MG-UNIT per tablet Take 1 tablet by mouth daily.        . clorazepate (TRANXENE) 15 MG tablet Take 15 mg by mouth. Take 1/2 tablet by mouth once daily       . cromolyn (NASALCROM) 5.2 MG/ACT nasal spray 2 sprays by Nasal route 2 (two) times daily.        [name] doxazosin (CARDURA) 4 MG tablet Take 4 mg by mouth at bedtime.        . fenofibrate 160 MG tablet Take 160 mg by mouth daily.        . fish oil-omega-3 fatty acids 1000 MG capsule Take 2 capsules by mouth daily.        . metoprolol (TOPROL-XL) 200 MG 24 hr tablet Take 200 mg by mouth. Take 1/2 tablet by mouth once daily       . simvastatin (ZOCOR) 40 MG tablet Take 40 mg by mouth at bedtime.          Past Medical History  Diagnosis Date  . Anxiety   . Diverticulitis   . Hypertension   . Prostate cancer   . Inferior mesenteric vein thrombosis   . Dyslipidemia     Past Surgical History  Procedure Date   . Cataract extraction 01/27/2010    right  . Cataract extraction 03/04/2010    left  . Prostate seed implants     Family History  Problem Relation Age of Onset  . Dementia Mother   . Alzheimer's disease Mother     History   Social History  . Marital Status: Married    Spouse Name: N/A    Number of Children: N/A  . Years of Education: N/A   Occupational History  . Professor     Retired   Social History Main Topics  . Smoking status: Never Smoker   . Smokeless tobacco: Never Used  . Alcohol Use: No  . Drug Use: No  . Sexually Active: Not on file   Other Topics Concern  . Not on file   Social History Narrative  . No narrative on file   As stated in the HPI and negative for all other systems.  Physical Exam: GENERAL:  Well appearing HEENT:  Pupils equal round and reactive, fundi not visualized, oral mucosa unremarkable NECK:  No jugular venous distention, waveform within normal limits, carotid upstroke brisk and  symmetric, no bruits, no thyromegaly LYMPHATICS:  No cervical, inguinal adenopathy LUNGS:  Clear to auscultation bilaterally BACK:  No CVA tenderness CHEST:  Unremarkable HEART:  PMI not displaced or sustained,S1 and S2 within normal limits, no S3, no S4, no clicks, no rubs, no murmurs ABD:  Flat, positive bowel sounds normal in frequency in pitch, no bruits, no rebound, no guarding, no midline pulsatile mass, no hepatomegaly, no splenomegaly EXT:  2 plus pulses throughout, no edema, no cyanosis no clubbing SKIN:  No rashes no nodules NEURO:  Cranial nerves II through XII grossly intact, motor grossly intact throughout PSYCH:  Cognitively intact, oriented to person place and time  EKG:  Sinus rhythm, rate 62, axis within normal limits, intervals within normal limits, no acute ST-T wave changes

## 2011-03-27 NOTE — Assessment & Plan Note (Signed)
I have reviewed his profile with an LDL 95 HDL 42. This is excellent he will continue meds as listed.

## 2011-03-27 NOTE — Assessment & Plan Note (Signed)
I reviewed at length with the patient's episiotomy BNP. In the absence of any clinical symptoms no followup or further therapy if this is necessary. This does represent some increased LV pressure probably related to mild LVH but at this time he is completely asymptomatic. Control of blood pressure and prudent restricting use of salt he is in order. If in the future he ever has any symptom he would need further evaluation with repeat echocardiography.

## 2011-05-12 NOTE — Assessment & Plan Note (Signed)
[facility]                        [place] OFFICE NOTE   GLENNIE, RODDA                            MRN:          [id number]  DATE:01/29/2007                            DOB:          05/26/1928    Mark Stark is a 77 year old male, who in February 2007, had an inferior  mesenteric vein thrombosis, most likely secondary to concurrent  diverticulitis.  He was put on anticoagulation.  He was taken off, after  nine months of anticoagulation.  The patient was discontinued from  Coumadin for one month to assess an anticoagulant panel.  The  anticoagulant panel was obtained.  Everything looked unremarkable,  except for the following.   1. The phosphatidylserine based IgM antibodies were elevated at 57.  2. Lupus anticoagulant panel was also significant for elevated PT-TLA      at 63.1 and a DRVT at 48.6.  Protein S and protein C were      unremarkable.  Factor V Leyden was also within normal limits, as      well as the cardiolipin antibodies.   I discussed the above findings with Mark Stark, and given the slight  discrepancy in the labs, I recommended a second opinion from hematology  in regards whether we should restart Mark Stark on anticoagulant long-  term, or if the labs and his history of diverticulitis during the  thrombosis is sufficient to make a decision of discontinuing Coumadin  therapy.   Of note, the patient is currently under evaluation for prostate cancer.  He is currently leaning towards radiation therapy and has been referred  to radiation oncology for further recommendation.  He is currently  followed by Dr. Wanda Plump.     Leanne Chang, M.D.  Electronically Signed    LA/MedQ  DD: 02/06/2007  DT: 02/06/2007  Job #: [id number]

## 2011-05-12 NOTE — Op Note (Signed)
[facility]  Patient:    Mark Stark, Mark Stark Visit Number: [id number] MRN: [id number]          Service Type: Attending:  Verlin Grills, M.D. Dictated by:   Verlin Grills, M.D. Proc. Date: 02/26/02                             Operative Report  PROCEDURE:  Screening colonoscopy.  PROCEDURE INDICATION:  Mr. Mark Stark is a 77 year old male born May 21, 1928. Mr. Mark Stark is referred for his first screening colonoscopy with polypectomy to prevent colon cancer. I discussed with Mr. Mark Stark the complications associated with colonoscopy and polypectomy including a 15 per 1000 risk of bleeding and 4 per 1000 risk of colon perforation requiring surgical repair. Mr. Mark Stark has signed the operative permit.  ENDOSCOPIST:  Verlin Grills, M.D.  PREMEDICATION:  Demerol 100 mg, Versed 10 mg.  ENDOSCOPE:  Olympus Pediatric Colonoscope.  DESCRIPTION OF PROCEDURE:  After obtaining informed consent, Mr. Mark Stark was placed in the left lateral decubitus position. I administered intravenous Demerol and intravenous Versed to achieve conscious sedation for the procedure. The patients blood pressure, oxygen saturation and cardiac rhythm were monitored throughout the procedure and documented in the medical record.  Anal inspection was normal. Digital rectal exam revealed an enlarged but nonnodular prostate. The Olympus pediatric video colonoscope was introduced into the rectum and advanced to the cecum. Colonic preparation for the exam today was excellent.  [name] has universal diverticulosis without signs of diverticulitis.  Rectum:  Normal.  Sigmoid colon and descending colon:  Normal.  Splenic flexure:  Normal.  Transverse colon:  Normal.  Hepatic flexure:  Normal.  Ascending colon:  Normal.  Cecum and ileocecal valve:  Normal.  ASSESSMENT:  Universal colonic diverticulosis; otherwise normal proctocolonoscopy to the cecum. No endoscopic evidence for  the presence of colorectal neoplasia. Dictated by:   Verlin Grills, M.D. Attending:  Verlin Grills, M.D. DD:  02/26/02 TD:  02/26/02 Job: [id number]

## 2011-05-12 NOTE — H&P (Signed)
NAME:  Mark Stark, ZAMBITO NO.:  [id number]   MEDICAL RECORD NO.:  [id number]          PATIENT TYPE:  INP   LOCATION:  5737                         FACILITY:  [facility]   PHYSICIAN:  Corinna L. Lendell Caprice, MDDATE OF BIRTH:  [birth year]   DATE OF ADMISSION:  02/14/2006  DATE OF DISCHARGE:                                HISTORY & PHYSICAL   CHIEF COMPLAINT:  Blood clot, abdominal pain.   HISTORY OF PRESENT ILLNESS:  Mr. Starner is a pleasant 77 year old white male  who has had left lower quadrant pain for about a week.  It also has  progressed to the umbilicus.  He has had no vomiting.  He has had fevers low-  grade.  He has not eaten much.  He was diagnosed at an urgent care center  with gastroenteritis and sent home.  He came back to his primary care  physician who did a CAT scan which showed inflammation of the superior  mesenteric vein with possible thrombosis.  Patient's pain __________  apparently the plan was to watch it and if the pain got worse to be admitted  to the hospital.  Patient reports that he has a history of diverticulitis  and it feels similar to this but the CAT scan did not show any evidence of  diverticulitis.   PAST MEDICAL HISTORY:  1.  Anxiety.  2.  Diverticulosis.  3.  Diverticulitis.  4.  Gastroesophageal reflux disease.  5.  Hearing loss.  6.  Dyslipidemia.  7.  Hypertension.   MEDICATIONS:  1.  Tranxene 7.5 mg p.o. b.i.d.  2.  Toprol XL 100 mg a day.  3.  Aspirin 81 mg a day.  4.  Pravachol 10 mg a day.   SOCIAL HISTORY:  Patient is married and is here with his wife.  He is a  retired Airline pilot at [employer].  He drinks a glass of wine a day.  Does not smoke.   FAMILY HISTORY:  Negative for cancer or clotting disorders.   REVIEW OF SYSTEMS:  As above, otherwise negative.   PHYSICAL EXAMINATION:  VITAL SIGNS:  Temperature 98, heart rate 83,  respiratory rate 20, blood pressure 152/83.  GENERAL:  Patient is well-nourished,  well-developed, in no acute distress.  HEENT:  Normocephalic, atraumatic.  Pupils are equal, round, and reactive to  light.  Sclerae non-icteric.  Moist mucous membranes.  NECK:  Supple.  No lymphadenopathy.  LUNGS:  Clear to auscultation bilaterally without wheezes, rhonchi, or  rales.  CARDIOVASCULAR:  Regular rate and rhythm without murmurs, rubs, or gallops.  ABDOMEN:  Normal bowel sounds, soft.  He does have some guarding in the left  lower quadrant and rebound tenderness.  There is no mass, no distention.  EXTREMITIES:  No clubbing, cyanosis, edema.  SKIN:  No rash.  PSYCHIATRIC:  Normal affect.  NEUROLOGIC:  Alert and oriented.  Cranial nerves and sensory motor  examination are intact.   LABORATORIES:  CBC is unremarkable.  Coagulation panel unremarkable.  Complete metabolic panel is significant for an alkaline phosphatase of 118,  SGOT of  55, SGPT of 105.  UA is negative.   ASSESSMENT/PLAN:  Abdominal pain with possible SMV thrombosis.  I have  discussed the case with Dr. Maryclare Bean.  I will repeat the CAT scan to see  whether we can see the mesenteric vessels any better and if he does, in  fact, have mesenteric vein thrombosis he will need anticoagulation.      Corinna L. Lendell Caprice, MD  Electronically Signed     CLS/MEDQ  D:  02/15/2006  T:  02/15/2006  Job:  [id number]   cc:   Leanne Chang, M.D.  Fax: [phone]

## 2011-05-12 NOTE — Op Note (Signed)
NAME:  Mark Stark, RUNNION NO.:  [id number]   MEDICAL RECORD NO.:  [id number]          PATIENT TYPE:  AMB   LOCATION:  [facility]                         FACILITY:  [facility]   PHYSICIAN:  [place] Service, M.D.DATE OF BIRTH:  Nov 24, 1928   DATE OF PROCEDURE:  03/11/2007  DATE OF DISCHARGE:                               OPERATIVE REPORT   INDICATIONS:  [age]-year-old male with low-volume prostate cancer.   POSTOPERATIVE DIAGNOSES:  [age]-year-old male with low-volume  prostate cancer.   PROCEDURE:  Transperineal placement of radioactive I-125 seeds.   SURGEON:  [place] Service, M.D.   DRAINS:  An 18-French Foley.   COMPLICATIONS:  None obvious.   ANESTHESIA:  General.   DESCRIPTION OF PROCEDURE:  The patient was prepped and draped in the  dorsal lithotomy position after institution of an adequate level of  general anesthesia.  Suprapubic fluoroscopy unit, transrectal ultrasound  unit and rectal tube were positioned, as was the indwelling Foley  catheter.  Transverse and longitudinal scans of the prostate were  obtained using the ultrasound probe to duplicate planning images making  use of the sonographic planning with Nucletron device.  Accurate  imaging with Dr. Rennie Plowman help helped to lay out a satisfactory oncology  treatment plan.  Needles were then placed according to prearranged  coordinates, total number of needles 26, total number of seeds 69,  activity per seed 30.3600 mCi, isotope 125.  In each case, needle  followed the prearranged coordinates and seed placement appeared good.  Once adequate seed placement had taken place, confirmed with suprapubic  fluoroscopy images, rectal tube and transrectal ultrasound probe were  removed, as was indwelling Foley catheter.  Fiberoptic cystoscopy showed a normal urethra and sphincter,  nonobstructive prostate, clear efflux, right and left orifice.  Bladder  showed clear efflux, no bleeding sites and no  evidence of puncture or  retained seeds.  An 18-French Foley was inserted and left to straight  drain.  The patient was returned to Recovery in satisfactory condition.           ______________________________  [place] Service, M.D.     RH/MEDQ  D:  03/11/2007  T:  03/12/2007  Job:  [id number]   cc:   Corinna L. Lendell Caprice, MD   Leanne Chang, M.D.  Fax: [phone]   Maryln Gottron, M.D.  Fax: [phone]

## 2011-05-12 NOTE — Discharge Summary (Signed)
NAME:  NIQUAN, CHARNLEY NO.:  [id number]   MEDICAL RECORD NO.:  [id number]          PATIENT TYPE:  INP   LOCATION:  5737                         FACILITY:  [facility]   PHYSICIAN:  Corinna L. Lendell Caprice, MDDATE OF BIRTH:  14-Sep-1928   DATE OF ADMISSION:  02/14/2006  DATE OF DISCHARGE:  02/19/2006                                 DISCHARGE SUMMARY   DISCHARGE DIAGNOSES:  1.  Inferior mesenteric vein thrombosis.  2.  Diverticulitis.  3.  Hypertension.  4.  Gastroesophageal reflux disease.  5.  Dyslipidemia.  6.  Anxiety.   DISCHARGE MEDICATIONS:  1.  Ciprofloxacin 500 mg p.o. b.i.d. for three more days.  2.  Metronidazole 500 mg p.o. t.i.d. for three more days.  3.  Tylenol or Vicodin as needed for pain.   He may continue his other outpatient medications which include:  1.  Tranxene 7.5 mg p.o. b.i.d.  2.  Toprol XL 100 mg a day.  3.  He is to continue Pravachol 10 mg a day, but hold aspirin while on      Coumadin.   CONDITION ON DISCHARGE:  Condition stable.   ACTIVITY:  Activity ad lib.   FOLLOW UP:  Follow up with laboratory on Thursday for a PT/INR and then  follow up with Dr. Blossom Hoops on Friday for any adjustments in Coumadin to  keep the INR between 2.0 and 3.0.   CONSULTATIONS:  None.   PROCEDURE:  None.   DIET:  Diet should be low residue, Coumadin-friendly.  Condition stable.   PERTINENT LABORATORY DATA:  Initial coagulation panel was normal.  At  discharge, his INR is 2.0. CBC unremarkable.  Complete metabolic panel  significant for a total bilirubin of 1.3, ALT of 82, albumin of 2.9,  otherwise unremarkable. UA small bilirubin, 15 ketones, otherwise negative.  Special studies in radiology:  CT of the abdomen and pelvis showed inferior  mesenteric vein thrombosis with surrounding inflammation which may  contribute to mild gastric outlet obstruction. Atherosclerotic-type change  of the abdominal aorta without aneurysmal dilatation, mild  pulmonary  parenchymal changes in the lung bases, sigmoid diverticulitis is suspected  which may be a result of or contribute to inferior mesenteric vein  thrombosis and large prostate gland.  His EKG shows normal sinus rhythm.   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 Please see H&P for complete details. He  had had left lower quadrant pain for about a week which radiated to his  umbilicus.  He had low grade fevers and nausea but no vomiting. He had poor  appetite as well. He was originally seen in the walk-in clinic and was  diagnosed with viral gastroenteritis and sent home. He followed up with Dr.  Blossom Hoops, who ordered a CAT scan done at an outside facility which showed  inflammation in the mesenteric __________  with possible thrombosis of the  superior mesenteric vein.  Otherwise unremarkable.  Specifically no new  evidence of diverticulitis. The patient was directly admitted. He had a  repeat CAT scan which showed in fact inferior mesenteric vein thrombosis and  no ischemia. It also showed probably mild diverticulitis.  He had some mild  left lower quadrant pain on admission and otherwise a fairly normal exam. He  was started on antibiotics, heparin drip and Coumadin as well as pain  medication.  At the time of discharge, he was tolerating a solid diet. His  abdominal pain had improved and his Coumadin was therapeutic.  He was given  teaching by dietary about a Coumadin-friendly diet as well as low residue  for his recurrent diverticulitis.      Corinna L. Lendell Caprice, MD  Electronically Signed     CLS/MEDQ  D:  02/19/2006  T:  02/19/2006  Job:  [id number]   cc:   Leanne Chang, M.D.  Fax: [phone]

## 2011-06-15 ENCOUNTER — Telehealth: Payer: Self-pay | Admitting: *Deleted

## 2011-06-15 DIAGNOSIS — Z974 Presence of external hearing-aid: Secondary | ICD-10-CM

## 2011-06-15 NOTE — Telephone Encounter (Signed)
Pt notes that he needs referral to Dr. Prentice Docker office for hearing test and new hearing aid. Pt noted that he already has an appt and just needs the referral sent in order to follow through with the appt.

## 2011-06-15 NOTE — Telephone Encounter (Signed)
Ok to put in referral?  

## 2011-06-21 ENCOUNTER — Other Ambulatory Visit: Payer: Self-pay | Admitting: Family Medicine

## 2011-07-20 ENCOUNTER — Other Ambulatory Visit: Payer: Self-pay

## 2011-07-20 MED ORDER — CLORAZEPATE DIPOTASSIUM 15 MG PO TABS: ORAL_TABLET | ORAL | Status: DC

## 2011-07-20 NOTE — Telephone Encounter (Signed)
Last seen 02/21/11 and filled 06/21/11 please advise     KP

## 2011-07-20 NOTE — Telephone Encounter (Signed)
Faxed.   KP 

## 2011-09-19 ENCOUNTER — Other Ambulatory Visit: Payer: Self-pay | Admitting: Family Medicine

## 2011-09-19 MED ORDER — SIMVASTATIN 40 MG PO TABS: 40.0000 mg | ORAL_TABLET | Freq: Every day | ORAL | Status: DC

## 2011-09-19 NOTE — Telephone Encounter (Signed)
CPE scheduled for October      KP

## 2011-09-24 ENCOUNTER — Other Ambulatory Visit: Payer: Self-pay | Admitting: Family Medicine

## 2011-09-26 ENCOUNTER — Ambulatory Visit (INDEPENDENT_AMBULATORY_CARE_PROVIDER_SITE_OTHER): Payer: Medicare Other | Admitting: Family Medicine

## 2011-09-26 ENCOUNTER — Encounter: Payer: Self-pay | Admitting: Family Medicine

## 2011-09-26 VITALS — BP 136/76 | HR 62 | Temp 97.8°F | Ht 68.0 in | Wt 183.0 lb

## 2011-09-26 DIAGNOSIS — Z0000 Encounter for general adult medical examination without abnormal findings: Secondary | ICD-10-CM

## 2011-09-26 DIAGNOSIS — C61 Malignant neoplasm of prostate: Secondary | ICD-10-CM

## 2011-09-26 DIAGNOSIS — E785 Hyperlipidemia, unspecified: Secondary | ICD-10-CM

## 2011-09-26 DIAGNOSIS — I10 Essential (primary) hypertension: Secondary | ICD-10-CM

## 2011-09-26 DIAGNOSIS — Z8546 Personal history of malignant neoplasm of prostate: Secondary | ICD-10-CM

## 2011-09-26 LAB — LIPID PANEL
Cholesterol: 102 mg/dL (ref 0–200)
HDL: 39.2 mg/dL (ref >39.00)
LDL Cholesterol: 48 mg/dL (ref 0–99)
VLDL: 14.8 mg/dL (ref 0.0–40.0)

## 2011-09-26 LAB — BASIC METABOLIC PANEL
BUN: 22 mg/dL (ref 6–23)
CO2: 29 mEq/L (ref 19–32)
Chloride: 105 mEq/L (ref 96–112)
Creatinine, Ser: 1.2 mg/dL (ref 0.4–1.5)

## 2011-09-26 LAB — CBC WITH DIFFERENTIAL/PLATELET
Basophils Absolute: 0 10*3/uL (ref 0.0–0.1)
HCT: 43.4 % (ref 39.0–52.0)
Lymphocytes Relative: 28.8 % (ref 12.0–46.0)
Lymphs Abs: 1.5 10*3/uL (ref 0.7–4.0)
Monocytes Relative: 10.7 % (ref 3.0–12.0)
Platelets: 176 10*3/uL (ref 150.0–400.0)
RDW: 12.9 % (ref 11.5–14.6)

## 2011-09-26 LAB — HEPATIC FUNCTION PANEL
AST: 24 U/L (ref 0–37)
Total Bilirubin: 1.1 mg/dL (ref 0.3–1.2)

## 2011-09-26 MED ORDER — CLORAZEPATE DIPOTASSIUM 15 MG PO TABS: ORAL_TABLET | ORAL | Status: DC

## 2011-09-26 MED ORDER — TETANUS-DIPHTH-ACELL PERTUSSIS 5-2.5-18.5 LF-MCG/0.5 IM SUSP: 0.5000 mL | Freq: Once | INTRAMUSCULAR | Status: DC

## 2011-09-26 NOTE — Patient Instructions (Signed)
Preventative Care for Adults, Male   MAINTAIN REGULAR HEALTH EXAMS   A routine yearly physical is a good way to check in with your primary care provider about your health and preventive screening. It is also an opportunity to share updates about your health and any concerns you have and receive a thorough all-over exam.   If you smoke or chew tobacco, find out from your caregiver how to quit. It can literally save your life, no matter how long you have been a tobacco user. If you do not use tobacco, never begin.   Maintain a healthy diet and normal weight. Increased weight leads to problems with blood pressure and diabetes. Decrease saturated fat in the diet and increase regular exercise. Get information about proper diet from your caregiver if necessary. Eat a variety of foods, including fruit, vegetables, animal or vegetable protein, such as meat, fish, chicken, and eggs, or beans, lentils, tofu, and grains, such as rice.   High blood pressure causes heart and blood vessel problems. Fat leaves deposits in your arteries that can block them. This causes heart disease and vessel disease elsewhere in your body. Check your blood pressure regularly and keep it within normal limits. Men over age 50 or those who have a family history of high blood pressure should have it checked at least every year.   Aerobic exercise helps maintain good heart health. 30 minutes of moderate-intensity exercise is recommended. For example, a brisk walk that increases your heart rate and breathing. This walk should be done on most days of the week. Persistent high blood pressure should be treated with medicine if weight loss and exercise do not work.   For many men aged 20 and older, having a cholesterol test of the blood every 5 years is recommended. If your cholesterol is found to be borderline high, or if you have heart disease or certain other medical conditions, then you may need to have it monitored more frequently.   Avoid smoking,  drinking alcohol in excess (more than two drinks per day) or use of street drugs. Do not share needles with anyone. Ask for professional help if you need assistance or instructions on stopping the use of alcohol, cigarettes, and/or drugs.   Maintain normal blood lipids and cholesterol by minimizing your intake of saturated fat. Eat a well rounded diet otherwise, with plenty of fruit and vegetables. The National Institutes of Health encourage men to eat 5-9 servings of fruit and vegetables each day. Your caregiver can help you keep your risk of heart disease or stroke at a lower level.   Ask your caregiver if you are in need of earlier testing because of: a strong family history of heart disease, or you have signs of elevated testosterone (male sex hormone) levels. This can predispose you to earlier heart disease. Ask if you should have a stress test if your history suggests this. A stress test is a test done on a treadmill that looks for heart disease. This test can find disease prior to there being a problem.   Diabetes screening assesses your blood sugar level after a fasting once every 3 years after age 45 if previous tests were normal.   Most routine colon cancer screening begins at the age of 50. On a yearly basis, doctors may provide special easy to use take-home tests to check for hidden blood in the stool. Sigmoidoscopy or colonoscopy can detect the earliest forms of colon cancer and is life saving. These test use a   small camera at the end of a tube to directly examine the colon. Speak to your caregiver about this at age 50, when routine screening begins (and is repeated every 5 years unless early forms of pre-cancerous polyps or small growths are found).   At the age of 50 men usually start screening for prostate cancer every year. Screening may begin at a younger age for those with higher risk. Those at higher risk include African-Americans or having a family history of prostate cancer. There are two types  of tests for prostate cancer:   Prostate-specific antigen (PSA) testing. Recent studies raise questions about prostate cancer using PSA and you should discuss this with your caregiver.   Digital rectal exam (in which your doctor’s lubricated and gloved finger feels for enlargement of the prostate through the anus).   Practice safe sex. Use condoms. Condoms are used for birth control and to help reduce the spread of sexually transmitted infections (or STIs). Unsafe sex is having an unprotected physical relationship with someone who is bisexual, homosexual, uses intravenous street drugs, or going with someone who has sexual relations with high-risk groups. Practicing safe sex helps you avoid getting an STI. Some of the STIs are gonorrhea (the clap), chlamydia, syphilis, trichimonas, herpes, HPV (human papiloma virus) and HIV (human immunodeficiency virus) which causes AIDS. The herpes, HIV and HPV are viral illnesses that have no cure. These can result in disability, cancer and death.   It is not safe for someone who has AIDS or is HIV positive to have unprotected sex with someone else who is positive. The reason for this is the fact that there are many different strains of HIV. If you have a strain that is readily treated with medications and then suddenly introduce a strain from a partner that has no further treatment options, you may suddenly have a strain of HIV that is untreatable. Even if you are both positive for HIV, it is still necessary to practice safe sex.   Use sunscreen with a SPF (or skin protection factor) of 15 or greater. Apply sunscreen liberally and repeatedly throughout the day. Being outside in the sun when your shadow caused by the sun is shorter than you are, means you are being exposed to sun at greater intensity. Lighter skinned people are at a greater risk of skin cancer. Don’t forget to also wear sunglasses in order to protect your eyes from too much damaging sunlight. Damaging sunlight can  accelerate cataract formation.   Once a month do a whole body skin exam or review, using a mirror to look at your back. Notify your caregivers of changes in moles, especially if there are changes in shapes, colors, a size larger than a pencil eraser, an irregular border, or development of new moles.   Keep carbon monoxide and smoke detectors in your home functioning at all times. Change the batteries every 6 months or use a model that plugs into the wall.   Do a monthly exam of your testicles. Gently roll each testicle between your thumb and fingers, feeling for any abnormal lumps. The best time to do this is after a hot shower or bath when the tissues are looser. Notify your caregivers of any lumps, tenderness or changes in size or shape immediately.   Stay up to date with your tetanus shots and other required immunizations. You should have a booster for tetanus every 10 years. Be sure to get your flu shot every year, since 5%-20% of the [place] population   comes down with the flu. The flu vaccine changes each year, so being vaccinated once is not enough. Get your shot in the fall, before the flu season peaks. The table below lists important vaccines to get. Other vaccines to consider include:   Hepatitis A virus (to prevent a form of infection of the liver by a virus acquired from food), Varicella Zoster (a virus that causes shingles).   Meningococcal (against bacteria which cause a form of meningitis).   Brush your teeth twice a day with fluoride toothpaste, and floss once a day. Good oral hygiene prevents tooth decay and gum disease. The problems can be painful, unattractive, and can cause other health problems. Visit your dentist for a routine oral and dental check up and preventive care every 6-12 months.   The Body Mass Index or BMI is a way of measuring how much of your body is fat. Having a BMI above 27 increases the risk of heart disease, diabetes, hypertension, stroke and other problems related to obesity.  Your caregiver can help determine your BMI and based on it develop an exercise and dietary program to help you achieve or maintain this important measurement at a healthful level.   Wear seat belts whenever in a vehicle, whether a passenger or driver, and even for very short drives of a few minutes.   If you bicycle, wear a helmet at all times.   Below is a summary of the most important preventative healthcare services that adult males should seek on a regular basis throughout their lives:   Preventative Care for Adult Males    Preventative Service  Ages 19-39  Ages 40-64  Ages 65 and over    Schedule of medical visits  Every 5 years  Every 5 years     Schedule of dental visits  Every 6-12 months  Every 6-12 months     Health risk assessment and lifestyle counseling  Every 3-5 years  Every 3-5 years  Every 3-5 years    Blood pressure check**  Every 2 years  Every 2 years  Every 2 years    Total cholesterol check including HDL**  Every 5 years beginning at age 35  Every 5 years  Every 5 years through age 75, then optional.    Flexible sigmoidoscopy or colonoscopy**   Every 5 years beginning at age 50  Every 5 years through age 80, then optional.    Prostate screening   Every year beginning at age 50  Every Year    Testicular exam  Monthly  Monthly  Monthly    FOBT (fecal occult blood test)   Every year beginning at age 50  Every year until 80, then optional.    Skin self-exam  Monthly  Monthly  Monthly    Tetanus-diphtheria (Td) immunization  Every 10 years  Every 10 years  Every 10 years    Influenza immunization**  Every year  Every year  Every year    Pneumococcal immunization**  Optional  Optional  Every 5 years    Hepatitis B immunization**  Series of 3 immunizations   (if not done previously, usually given at 0, 1 to 2, and 4 to 6 months)  Check with your caregiver if vaccination not previously given.  Check with your caregiver if vaccination not previously given.    **Family history and personal history of  risk and conditions may change your physician's recommendations.    Document Released: 02/06/2002 Document Re-Released: 03/07/2010   [employer]® Patient Information ©  2011 [employer].

## 2011-09-26 NOTE — Progress Notes (Signed)
Subjective:    Mark Stark is a 77 y.o. male who presents for Medicare Annual/Subsequent preventive examination.   Preventive Screening-Counseling & Management  Tobacco History  Smoking status  . Never Smoker   Smokeless tobacco  . Never Used    Problems Prior to Visit 1.   Current Problems (verified) Patient Active Problem List  Diagnoses  . HYPERLIPIDEMIA NEC/NOS  . ANXIETY  . DECREASED HEARING, BILATERAL  . HYPERTENSION  . DERMATITIS, CONTACT, NOS  . Pain in soft tissues of limb  . ECCHYMOSES, SPONTANEOUS  . ANKLE SPRAIN, LEFT  . CONTUSION, RIGHT THIGH  . CONTUSION, LOWER LEG, LEFT  . PROSTATE CANCER, HX OF  . DIVERTICULITIS, HX OF  . EDEMA  . SYSTOLIC MURMUR  . Abnormal laboratory test  . AI (aortic insufficiency)    Medications Prior to Visit Current Outpatient Prescriptions on File Prior to Visit  Medication Sig Dispense Refill  . aspirin 81 MG tablet Take 81 mg by mouth daily.        [name] b complex vitamins tablet Take 1 tablet by mouth daily.        . Calcium Carbonate-Vitamin D (CALTRATE 600+D) 600-400 MG-UNIT per tablet Take 1 tablet by mouth daily.        . cromolyn (NASALCROM) 5.2 MG/ACT nasal spray 2 sprays by Nasal route 2 (two) times daily.        [name] doxazosin (CARDURA) 4 MG tablet TAKE 1 TABLET AT BEDTIME  30 tablet  6  . fenofibrate 160 MG tablet Take 160 mg by mouth daily.        . fish oil-omega-3 fatty acids 1000 MG capsule Take 2 capsules by mouth daily.        . metoprolol (TOPROL-XL) 200 MG 24 hr tablet Take 200 mg by mouth. Take 1/2 tablet by mouth once daily       . simvastatin (ZOCOR) 40 MG tablet Take 1 tablet (40 mg total) by mouth at bedtime.  30 tablet  0  . DISCONTD: clorazepate (TRANXENE) 15 MG tablet Take 1/2 tablet by mouth once daily  30 tablet  2    Current Medications (verified) Current Outpatient Prescriptions  Medication Sig Dispense Refill  . aspirin 81 MG tablet Take 81 mg by mouth daily.        [name] b complex vitamins  tablet Take 1 tablet by mouth daily.        . Calcium Carbonate-Vitamin D (CALTRATE 600+D) 600-400 MG-UNIT per tablet Take 1 tablet by mouth daily.        . clorazepate (TRANXENE) 15 MG tablet 1 po qd  30 tablet  2  . cromolyn (NASALCROM) 5.2 MG/ACT nasal spray 2 sprays by Nasal route 2 (two) times daily.        [name] doxazosin (CARDURA) 4 MG tablet TAKE 1 TABLET AT BEDTIME  30 tablet  6  . fenofibrate 160 MG tablet Take 160 mg by mouth daily.        . fish oil-omega-3 fatty acids 1000 MG capsule Take 2 capsules by mouth daily.        . metoprolol (TOPROL-XL) 200 MG 24 hr tablet Take 200 mg by mouth. Take 1/2 tablet by mouth once daily       . simvastatin (ZOCOR) 40 MG tablet Take 1 tablet (40 mg total) by mouth at bedtime.  30 tablet  0  . DISCONTD: clorazepate (TRANXENE) 15 MG tablet Take 1/2 tablet by mouth once daily  30 tablet  2  .  DISCONTD: clorazepate (TRANXENE) 15 MG tablet Take 15 mg by mouth daily. Take 1/2 tablet by mouth once daily       . TDaP (BOOSTRIX) 5-2.5-18.5 LF-MCG/0.5 injection Inject 0.5 mLs into the muscle once.  0.5 mL  0     Allergies (verified) Sulfonamide derivatives   PAST HISTORY  Family History Family History  Problem Relation Age of Onset  . Dementia Mother   . Alzheimer's disease Mother   . Mental illness Mother     ALZ DISEASE    Social History History  Substance Use Topics  . Smoking status: Never Smoker   . Smokeless tobacco: Never Used  . Alcohol Use: No    Are there smokers in your home (other than you)?  No  Risk Factors Current exercise habits: Home exercise routine includes ski machine, yard work.  Dietary issues discussed: NA   Cardiac risk factors: advanced age (older than 59 for men, 23 for women), dyslipidemia, hypertension and male gender.  Depression Screen (Note: if answer to either of the following is "Yes", a more complete depression screening is indicated)   Q1: Over the past two weeks, have you felt down, depressed or  hopeless? No  Q2: Over the past two weeks, have you felt little interest or pleasure in doing things? No  Have you lost interest or pleasure in daily life? No  Do you often feel hopeless? No  Do you cry easily over simple problems? No  Activities of Daily Living In your present state of health, do you have any difficulty performing the following activities?:  Driving? No Managing money?  No Feeding yourself? No Getting from bed to chair? No Climbing a flight of stairs? No Preparing food and eating?: No Bathing or showering? No Getting dressed: No Getting to the toilet? No Using the toilet:No Moving around from place to place: No In the past year have you fallen or had a near fall?:No   Are you sexually active?  Yes  Do you have more than one partner?  No  Hearing Difficulties: Yes Do you often ask people to speak up or repeat themselves? Yes--  Pt has new hearing aids--doing well Do you experience ringing or noises in your ears? Yes Do you have difficulty understanding soft or whispered voices? Yes   Do you feel that you have a problem with memory? No  Do you often misplace items? No  Do you feel safe at home?  Yes  Cognitive Testing  Alert? Yes  Normal Appearance?Yes  Oriented to person? Yes  Place? Yes   Time? Yes  Recall of three objects?  Yes  Can perform simple calculations? Yes  Displays appropriate judgment?Yes  Can read the correct time from a watch face?Yes   Advanced Directives have been discussed with the patient? Yes   List the Names of Other Physician/Practitioners you currently use: 1.  optho-[place] 2 urology-- Dr Brunilda Payor 3.  Dentist-- Addison Lank 4.  Derm--[place] Indicate any recent Medical Services you may have received from other than [facility] providers in the past year (date may be approximate).  Immunization History  Administered Date(s) Administered  . Influenza Whole 11/14/2007, 09/30/2008, 09/08/2009, 08/31/2010  . Pneumococcal Polysaccharide  09/22/2009  . Zoster 03/19/2008    Screening Tests Health Maintenance  Topic Date Due  . Tetanus/tdap  07/02/1949  . Colonoscopy  02/25/2012  . Influenza Vaccine  09/24/2012  . Pneumococcal Polysaccharide Vaccine Age 71 And Over  Completed  . Zostavax  Completed  All answers were reviewed with the patient and necessary referrals were made:  Loreen Freud, DO   09/26/2011   History reviewed: allergies, current medications, past family history, past medical history, past social history, past surgical history and problem list  Review of Systems  Review of Systems  Constitutional: Negative for activity change, appetite change and fatigue.  HENT: Negative for hearing loss, congestion, tinnitus and ear discharge.   Eyes: Negative for visual disturbance (see optho q1y -- vision corrected to 20/20 with glasses).  Respiratory: Negative for cough, chest tightness and shortness of breath.   Cardiovascular: Negative for chest pain, palpitations and leg swelling.  Gastrointestinal: Negative for abdominal pain, diarrhea, constipation and abdominal distention.  Genitourinary: Negative for urgency, frequency, decreased urine volume and difficulty urinating.  Musculoskeletal: Negative for back pain, arthralgias and gait problem.  Skin: Negative for color change, pallor and rash.  Neurological: Negative for dizziness, light-headedness, numbness and headaches.  Hematological: Negative for adenopathy. Does not bruise/bleed easily.  Psychiatric/Behavioral: Negative for suicidal ideas, confusion, sleep disturbance, self-injury, dysphoric mood, decreased concentration and agitation.  Pt is able to read and write and can do all ADLs No risk for falling No abuse/ violence in home      Objective:     Vision by Snellen chart: right ZOX:WRUEA, left VWU:JWJXB Blood pressure 136/76, pulse 62, temperature 97.8 F (36.6 C), temperature source Oral, height 5\' 8"  (1.727 m), weight 183 lb (83.008 kg), SpO2  95.00%. Body mass index is 27.82 kg/(m^2).  BP 136/76  Pulse 62  Temp(Src) 97.8 F (36.6 C) (Oral)  Ht 5\' 8"  (1.727 m)  Wt 183 lb (83.008 kg)  BMI 27.82 kg/m2  SpO2 95% General appearance: alert, cooperative, appears stated age and no distress Head: Normocephalic, without obvious abnormality, atraumatic Eyes: negative findings: lids and lashes normal, conjunctivae and sclerae normal and pupils equal, round, reactive to light and accomodation Ears: normal TM's and external ear canals both ears Nose: Nares normal. Septum midline. Mucosa normal. No drainage or sinus tenderness. Throat: lips, mucosa, and tongue normal; teeth and gums normal Neck: no adenopathy, no carotid bruit, no JVD, supple, symmetrical, trachea midline and thyroid not enlarged, symmetric, no tenderness/mass/nodules Back: symmetric, no curvature. ROM normal. No CVA tenderness. Lungs: clear to auscultation bilaterally Chest wall: no tenderness Heart: regular rate and rhythm, S1, S2 normal, no murmur, click, rub or gallop Abdomen: soft, non-tender; bowel sounds normal; no masses,  no organomegaly Male genitalia: urologist Rectal: urologist Extremities: extremities normal, atraumatic, no cyanosis or edema Pulses: 2+ and symmetric Skin: Skin color, texture, turgor normal. No rashes or lesions Lymph nodes: Cervical, supraclavicular, and axillary nodes normal. Neurologic: Alert and oriented X 3, normal strength and tone. Normal symmetric reflexes. Normal coordination and gait Psych-- no depression, anxiety     Assessment:    CPe Hx prostate CA-- per urol  hyperlipidemia--check fasting labs htn---con't meds, stable     Plan:     During the course of the visit the patient was educated and counseled about appropriate screening and preventive services including:    Pneumococcal vaccine   Influenza vaccine  Td vaccine  Prostate cancer screening  Colorectal cancer screening  Advanced directives: has an  advanced directive - a copy HAS NOT been provided.  Diet review for nutrition referral? Yes ____  Not Indicated ____X   Patient Instructions (the written plan) was given to the patient.  Medicare Attestation I have personally reviewed: The patient's medical and social history Their use of alcohol, tobacco  or illicit drugs Their current medications and supplements The patient's functional ability including ADLs,fall risks, home safety risks, cognitive, and hearing and visual impairment Diet and physical activities Evidence for depression or mood disorders  The patient's weight, height, BMI, and visual acuity have been recorded in the chart.  I have made referrals, counseling, and provided education to the patient based on review of the above and I have provided the patient with a written personalized care plan for preventive services.     Loreen Freud, DO   09/26/2011

## 2011-10-17 ENCOUNTER — Other Ambulatory Visit: Payer: Self-pay | Admitting: Family Medicine

## 2011-10-17 MED ORDER — SIMVASTATIN 40 MG PO TABS: 40.0000 mg | ORAL_TABLET | Freq: Every day | ORAL | Status: DC

## 2011-10-17 NOTE — Telephone Encounter (Signed)
Labs current     KP 

## 2011-11-20 ENCOUNTER — Other Ambulatory Visit: Payer: Self-pay | Admitting: Family Medicine

## 2011-11-20 MED ORDER — METOPROLOL SUCCINATE ER 200 MG PO TB24: ORAL_TABLET | ORAL | Status: DC

## 2011-11-20 NOTE — Telephone Encounter (Signed)
CPE done and Rx has been faxed    KP

## 2012-02-12 ENCOUNTER — Other Ambulatory Visit: Payer: Self-pay | Admitting: Family Medicine

## 2012-02-12 DIAGNOSIS — D1801 Hemangioma of skin and subcutaneous tissue: Secondary | ICD-10-CM | POA: Diagnosis not present

## 2012-02-12 MED ORDER — DOXAZOSIN MESYLATE 4 MG PO TABS: 4.0000 mg | ORAL_TABLET | Freq: Every day | ORAL | Status: DC

## 2012-02-12 NOTE — Telephone Encounter (Signed)
CPE current--Rx faxed   KP 

## 2012-03-14 ENCOUNTER — Telehealth: Payer: Self-pay | Admitting: Family Medicine

## 2012-03-14 MED ORDER — CLORAZEPATE DIPOTASSIUM 15 MG PO TABS: ORAL_TABLET | ORAL | Status: DC

## 2012-03-14 MED ORDER — FENOFIBRATE 160 MG PO TABS: 160.0000 mg | ORAL_TABLET | Freq: Every day | ORAL | Status: DC

## 2012-03-14 NOTE — Telephone Encounter (Signed)
REFILL- clorazepate 15mg  tablet. Take one tablet every day.

## 2012-03-14 NOTE — Telephone Encounter (Signed)
Refill- fenofibrate 160mg  tablet. Take one tablet by mouth every day. Qty 90 last fill 2.23.13

## 2012-03-25 DIAGNOSIS — C61 Malignant neoplasm of prostate: Secondary | ICD-10-CM | POA: Diagnosis not present

## 2012-04-09 ENCOUNTER — Encounter: Payer: Self-pay | Admitting: Family Medicine

## 2012-04-09 ENCOUNTER — Ambulatory Visit (INDEPENDENT_AMBULATORY_CARE_PROVIDER_SITE_OTHER): Payer: Medicare Other | Admitting: Family Medicine

## 2012-04-09 VITALS — BP 112/78 | HR 69 | Temp 97.9°F | Ht 68.0 in | Wt 188.6 lb

## 2012-04-09 DIAGNOSIS — H6240 Otitis externa in other diseases classified elsewhere, unspecified ear: Secondary | ICD-10-CM | POA: Diagnosis not present

## 2012-04-09 DIAGNOSIS — B369 Superficial mycosis, unspecified: Secondary | ICD-10-CM

## 2012-04-09 MED ORDER — HYDROCORTISONE-ACETIC ACID 1-2 % OT SOLN: 5.0000 [drp] | Freq: Three times a day (TID) | OTIC | Status: AC

## 2012-04-09 NOTE — Progress Notes (Signed)
  Subjective:    Patient ID: KAYLAN FRIEDMANN, male    DOB: January 05, 1928, 78 y.o.   MRN: [id number]  HPI Fungal infection of hearing aide- saw Audiologist on Friday and after complaining of ear itching was told he had a fungal infection.  Some drainage from L ear.  Only L ear itches.   Review of Systems For ROS see HPI     Objective:   Physical Exam  Vitals reviewed. Constitutional: He appears well-developed and well-nourished. No distress.  HENT:  Head: Normocephalic and atraumatic.  Right Ear: Tympanic membrane, external ear and ear canal normal. No drainage or tenderness. Decreased hearing is noted.  Left Ear: Tympanic membrane normal. No drainage or swelling. Decreased hearing is noted.       inflammed EAC w/ scattered debris          Assessment & Plan:

## 2012-04-09 NOTE — Patient Instructions (Signed)
Use the ear drops 3x/day as directed Try and wipe down your hearing aides w/ alcohol to prevent re-infection Call with any questions or concerns Hang in there!

## 2012-04-09 NOTE — Assessment & Plan Note (Signed)
New.  L EAC fungal dermatitis.  Start drops for sxs relief and tx.  Encouraged him to clean hearing aides to prevent re-infxn.  Reviewed supportive care and red flags that should prompt return.  Pt expressed understanding and is in agreement w/ plan.

## 2012-04-15 ENCOUNTER — Other Ambulatory Visit: Payer: Self-pay | Admitting: Family Medicine

## 2012-04-15 MED ORDER — SIMVASTATIN 40 MG PO TABS: 40.0000 mg | ORAL_TABLET | Freq: Every day | ORAL | Status: DC

## 2012-04-15 NOTE — Telephone Encounter (Signed)
refill for simvastatin 40MG  Tablet Qty 30 Take 1-tablet at bedtime Last filled 3.21.13  Last OV 04.16.13

## 2012-05-14 ENCOUNTER — Other Ambulatory Visit: Payer: Self-pay | Admitting: Family Medicine

## 2012-05-14 MED ORDER — SIMVASTATIN 40 MG PO TABS: 40.0000 mg | ORAL_TABLET | Freq: Every day | ORAL | Status: DC

## 2012-05-14 NOTE — Telephone Encounter (Signed)
Rx faxed.    KP 

## 2012-05-21 ENCOUNTER — Ambulatory Visit (INDEPENDENT_AMBULATORY_CARE_PROVIDER_SITE_OTHER): Payer: Medicare Other | Admitting: Family Medicine

## 2012-05-21 ENCOUNTER — Encounter: Payer: Self-pay | Admitting: Family Medicine

## 2012-05-21 VITALS — BP 126/78 | HR 64 | Temp 97.7°F | Wt 183.2 lb

## 2012-05-21 DIAGNOSIS — R739 Hyperglycemia, unspecified: Secondary | ICD-10-CM

## 2012-05-21 DIAGNOSIS — E785 Hyperlipidemia, unspecified: Secondary | ICD-10-CM | POA: Diagnosis not present

## 2012-05-21 DIAGNOSIS — IMO0001 Reserved for inherently not codable concepts without codable children: Secondary | ICD-10-CM

## 2012-05-21 DIAGNOSIS — R7309 Other abnormal glucose: Secondary | ICD-10-CM | POA: Diagnosis not present

## 2012-05-21 LAB — CBC WITH DIFFERENTIAL/PLATELET
Basophils Absolute: 0 10*3/uL (ref 0.0–0.1)
Basophils Relative: 0.3 % (ref 0.0–3.0)
Eosinophils Absolute: 0.2 10*3/uL (ref 0.0–0.7)
MCHC: 33.5 g/dL (ref 30.0–36.0)
MCV: 98.4 fl (ref 78.0–100.0)
Monocytes Absolute: 0.5 10*3/uL (ref 0.1–1.0)
Neutrophils Relative %: 57.7 % (ref 43.0–77.0)
Platelets: 171 10*3/uL (ref 150.0–400.0)
RBC: 4.48 Mil/uL (ref 4.22–5.81)
RDW: 12.6 % (ref 11.5–14.6)
WBC: 5.2 10*3/uL (ref 4.5–10.5)

## 2012-05-21 LAB — BASIC METABOLIC PANEL
CO2: 28 mEq/L (ref 19–32)
Chloride: 106 mEq/L (ref 96–112)
Glucose, Bld: 82 mg/dL (ref 70–99)
Potassium: 3.9 mEq/L (ref 3.5–5.1)
Sodium: 141 mEq/L (ref 135–145)

## 2012-05-21 LAB — HEPATIC FUNCTION PANEL
ALT: 28 U/L (ref 0–53)
AST: 27 U/L (ref 0–37)
Alkaline Phosphatase: 24 U/L — ABNORMAL LOW (ref 39–117)
Bilirubin, Direct: 0.1 mg/dL (ref 0.0–0.3)
Total Protein: 6.8 g/dL (ref 6.0–8.3)

## 2012-05-21 LAB — LIPID PANEL
LDL Cholesterol: 51 mg/dL (ref 0–99)
Total CHOL/HDL Ratio: 3

## 2012-05-21 NOTE — Patient Instructions (Signed)
Ringworm, Nail  A fungal infection of the nail (tinea unguium/onychomycosis) is common. It is common as the visible part of the nail is composed of dead cells which have no blood supply to help prevent infection. It occurs because fungi are everywhere and will pick any opportunity to grow on any dead material.  Because nails are very slow growing they require up to 2 years of treatment with anti-fungal medications. The entire nail back to the base is infected. This includes approximately ? of the nail which you cannot see.  If your caregiver has prescribed a medication by mouth, take it every day and as directed. No progress will be seen for at least 6 to 9 months. Do not be disappointed! Because fungi live on dead cells with little or no exposure to blood supply, medication delivery to the infection is slow; thus the cure is slow. It is also why you can observe no progress in the first 6 months. The nail becoming cured is the base of the nail, as it has the blood supply. Topical medication such as creams and ointments are usually not effective. Important in successful treatment of nail fungus is closely following the medication regimen that your doctor prescribes.  Sometimes you and your caregiver may elect to speed up this process by surgical removal of all the nails. Even this may still require 6 to 9 months of additional oral medications.  See your caregiver as directed. Remember there will be no visible improvement for at least 6 months. See your caregiver sooner if other signs of infection (redness and swelling) develop.  Document Released: 12/08/2000 Document Revised: 11/30/2011 Document Reviewed: 02/16/2009  Exit[employer] Patient Information 2012 [employer].

## 2012-05-21 NOTE — Progress Notes (Signed)
  Subjective:    Patient ID: Mark Stark, male    DOB: 10/19/1928, 78 y.o.   MRN: [id number]  HPI Pt here c/o nail turning dark after injuring them about 2 years ago--- the left one grew out but the right one never did.  The right big toenail is loose but causing no discomfort / pain.  Pt does not want meds or to go through any procedure to remove nail.  He just wants to make sure it is ok.  Pt is also here to have labs done.     Review of Systems As above    Objective:   Physical Exam  Constitutional: He is oriented to person, place, and time. He appears well-developed and well-nourished.  Musculoskeletal:       R big toe-- nail is black and loose                   No signs of infection,nontender  Neurological: He is alert and oriented to person, place, and time.  Psychiatric: He has a normal mood and affect. His behavior is normal. Judgment and thought content normal.          Assessment & Plan:  Hematoma R big toe---- likely to fall off on its own.  Pt does not want to do anything today---rto prn  Hyperlipidemia--- check labs

## 2012-05-22 ENCOUNTER — Encounter: Payer: Self-pay | Admitting: *Deleted

## 2012-06-12 ENCOUNTER — Other Ambulatory Visit: Payer: Self-pay | Admitting: Family Medicine

## 2012-06-12 MED ORDER — SIMVASTATIN 40 MG PO TABS: 40.0000 mg | ORAL_TABLET | Freq: Every day | ORAL | Status: DC

## 2012-06-12 MED ORDER — FENOFIBRATE 160 MG PO TABS: 160.0000 mg | ORAL_TABLET | Freq: Every day | ORAL | Status: DC

## 2012-06-12 NOTE — Telephone Encounter (Signed)
Refills x 2   Last ov 5.28.13  1-simvastatin 40mg  tablet # 30, take one tablet by mouth at bedtime *LABS done 5.28.13 last fill 5.21.13  2-fenofibrate 160mg  tablet #90, take one tablet by m outh daily, last fill 5.20.13

## 2012-08-12 ENCOUNTER — Telehealth: Payer: Self-pay | Admitting: Family Medicine

## 2012-08-12 MED ORDER — DOXAZOSIN MESYLATE 4 MG PO TABS: 4.0000 mg | ORAL_TABLET | Freq: Every day | ORAL | Status: DC

## 2012-08-12 MED ORDER — CLORAZEPATE DIPOTASSIUM 15 MG PO TABS: ORAL_TABLET | ORAL | Status: DC

## 2012-08-12 NOTE — Telephone Encounter (Signed)
Caller: [name]/Patient; Patient Name: Mark Stark; PCP: Lelon Perla.; Best Callback Phone Number: [phone] Calling to find out if Medication Refills have been called in. Chart checked via EPIC and notified that 2 meds called to [facility] on [address] Cardura and Tranxene. Medication Questions Protocol.

## 2012-08-12 NOTE — Telephone Encounter (Signed)
Refill: Doxazosin 4mg  and Clorazepate 15mg .  [facility] [address]  Pts wife called stating she called these 2 prescriptions into [facility] on Thursday and they told her they haven't heard back from [place]. It doesn't look like we received anything from them. Pharmacy advised pt to call the office; pt and wife are scheduled to leave town later this morning and are requesting these medications be filled ASAP.

## 2012-08-29 DIAGNOSIS — Z23 Encounter for immunization: Secondary | ICD-10-CM | POA: Diagnosis not present

## 2012-11-07 DIAGNOSIS — H35379 Puckering of macula, unspecified eye: Secondary | ICD-10-CM | POA: Insufficient documentation

## 2012-11-07 DIAGNOSIS — H43819 Vitreous degeneration, unspecified eye: Secondary | ICD-10-CM | POA: Diagnosis not present

## 2012-11-07 DIAGNOSIS — Z961 Presence of intraocular lens: Secondary | ICD-10-CM | POA: Insufficient documentation

## 2012-11-14 DIAGNOSIS — I10 Essential (primary) hypertension: Secondary | ICD-10-CM | POA: Diagnosis not present

## 2012-11-14 DIAGNOSIS — S335XXA Sprain of ligaments of lumbar spine, initial encounter: Secondary | ICD-10-CM | POA: Diagnosis not present

## 2012-11-14 DIAGNOSIS — R1084 Generalized abdominal pain: Secondary | ICD-10-CM | POA: Diagnosis not present

## 2012-11-14 DIAGNOSIS — Z8546 Personal history of malignant neoplasm of prostate: Secondary | ICD-10-CM | POA: Diagnosis not present

## 2012-11-14 DIAGNOSIS — X503XXA Overexertion from repetitive movements, initial encounter: Secondary | ICD-10-CM | POA: Diagnosis not present

## 2012-11-14 DIAGNOSIS — E860 Dehydration: Secondary | ICD-10-CM | POA: Diagnosis not present

## 2012-11-14 DIAGNOSIS — C61 Malignant neoplasm of prostate: Secondary | ICD-10-CM | POA: Diagnosis not present

## 2012-11-14 DIAGNOSIS — E7800 Pure hypercholesterolemia, unspecified: Secondary | ICD-10-CM | POA: Diagnosis not present

## 2012-11-14 DIAGNOSIS — K5732 Diverticulitis of large intestine without perforation or abscess without bleeding: Secondary | ICD-10-CM | POA: Diagnosis not present

## 2012-11-14 DIAGNOSIS — M545 Low back pain: Secondary | ICD-10-CM | POA: Diagnosis not present

## 2012-12-04 ENCOUNTER — Other Ambulatory Visit: Payer: Self-pay | Admitting: Family Medicine

## 2012-12-06 ENCOUNTER — Other Ambulatory Visit: Payer: Self-pay | Admitting: *Deleted

## 2012-12-06 MED ORDER — METOPROLOL SUCCINATE ER 200 MG PO TB24: ORAL_TABLET | ORAL | Status: DC

## 2012-12-06 MED ORDER — SIMVASTATIN 40 MG PO TABS: 40.0000 mg | ORAL_TABLET | Freq: Every day | ORAL | Status: DC

## 2012-12-06 MED ORDER — FENOFIBRATE 160 MG PO TABS: 160.0000 mg | ORAL_TABLET | Freq: Every day | ORAL | Status: DC

## 2012-12-06 NOTE — Telephone Encounter (Signed)
Rx sent 

## 2012-12-06 NOTE — Telephone Encounter (Signed)
Pending apt---   KP

## 2012-12-10 ENCOUNTER — Ambulatory Visit (INDEPENDENT_AMBULATORY_CARE_PROVIDER_SITE_OTHER): Payer: Medicare Other | Admitting: Family Medicine

## 2012-12-10 ENCOUNTER — Encounter: Payer: Self-pay | Admitting: Family Medicine

## 2012-12-10 VITALS — BP 132/70 | HR 69 | Temp 98.1°F | Wt 185.2 lb

## 2012-12-10 DIAGNOSIS — E785 Hyperlipidemia, unspecified: Secondary | ICD-10-CM | POA: Diagnosis not present

## 2012-12-10 DIAGNOSIS — M549 Dorsalgia, unspecified: Secondary | ICD-10-CM

## 2012-12-10 DIAGNOSIS — Z0000 Encounter for general adult medical examination without abnormal findings: Secondary | ICD-10-CM

## 2012-12-10 NOTE — Progress Notes (Signed)
  Subjective:    Mark Stark is a 78 y.o. male who presents for evaluation of low back pain. The patient has had recurrent self limited episodes of low back pain in the past. Symptoms have been present for 6 weeks and are gradually improving.  Onset was related to / precipitated by threw a bag of leaves over the fence. The pain is located in the lef hip today and does not radiate. The pain is described as sharp and occurs intermittently. He is currently in no pain. Symptoms are exacerbated by flexion and twisting. Symptoms are improved by change in body position, rest and stretching. He has also tried narcotic pain medications which provided no symptom relief. He has no other symptoms associated with the back pain. The patient has no "red flag" history indicative of complicated back pain.  The following portions of the patient's history were reviewed and updated as appropriate: allergies, current medications, past family history, past medical history, past social history, past surgical history and problem list.  Review of Systems Pertinent items are noted in HPI.    Objective:   Full range of motion without pain, no tenderness, no spasm, no curvature.    Assessment:    Nonspecific acute low back pain    Plan:    Natural history and expected course discussed. Questions answered. Stretching exercises discussed. Regular aerobic and trunk strengthening exercises discussed. Short (2-4 day) period of relative rest recommended until acute symptoms improve. Ice to affected area as needed for local pain relief. Heat to affected area as needed for local pain relief. pt did not want muscle relaxers ---rto prn

## 2012-12-10 NOTE — Patient Instructions (Addendum)
Back Pain, Adult Low back pain is very common. About 1 in 5 people have back pain.The cause of low back pain is rarely dangerous. The pain often gets better over time.About half of people with a sudden onset of back pain feel better in just 2 weeks. About 8 in 10 people feel better by 6 weeks.  CAUSES Some common causes of back pain include:  Strain of the muscles or ligaments supporting the spine.  Wear and tear (degeneration) of the spinal discs.  Arthritis.  Direct injury to the back. DIAGNOSIS Most of the time, the direct cause of low back pain is not known.However, back pain can be treated effectively even when the exact cause of the pain is unknown.Answering your caregiver's questions about your overall health and symptoms is one of the most accurate ways to make sure the cause of your pain is not dangerous. If your caregiver needs more information, he or she may order lab work or imaging tests (X-rays or MRIs).However, even if imaging tests show changes in your back, this usually does not require surgery. HOME CARE INSTRUCTIONS For many people, back pain returns.Since low back pain is rarely dangerous, it is often a condition that people can learn to manageon their own.   Remain active. It is stressful on the back to sit or stand in one place. Do not sit, drive, or stand in one place for more than 30 minutes at a time. Take short walks on level surfaces as soon as pain allows.Try to increase the length of time you walk each day.  Do not stay in bed.Resting more than 1 or 2 days can delay your recovery.  Do not avoid exercise or work.Your body is made to move.It is not dangerous to be active, even though your back may hurt.Your back will likely heal faster if you return to being active before your pain is gone.  Pay attention to your body when you bend and lift. Many people have less discomfortwhen lifting if they bend their knees, keep the load close to their bodies,and  avoid twisting. Often, the most comfortable positions are those that put less stress on your recovering back.  Find a comfortable position to sleep. Use a firm mattress and lie on your side with your knees slightly bent. If you lie on your back, put a pillow under your knees.  Only take over-the-counter or prescription medicines as directed by your caregiver. Over-the-counter medicines to reduce pain and inflammation are often the most helpful.Your caregiver may prescribe muscle relaxant drugs.These medicines help dull your pain so you can more quickly return to your normal activities and healthy exercise.  Put ice on the injured area.  Put ice in a plastic bag.  Place a towel between your skin and the bag.  Leave the ice on for 15 to 20 minutes, 3 to 4 times a day for the first 2 to 3 days. After that, ice and heat may be alternated to reduce pain and spasms.  Ask your caregiver about trying back exercises and gentle massage. This may be of some benefit.  Avoid feeling anxious or stressed.Stress increases muscle tension and can worsen back pain.It is important to recognize when you are anxious or stressed and learn ways to manage it.Exercise is a great option. SEEK MEDICAL CARE IF:  You have pain that is not relieved with rest or medicine.  You have pain that does not improve in 1 week.  You have new symptoms.  You are generally   not feeling well. SEEK IMMEDIATE MEDICAL CARE IF:   You have pain that radiates from your back into your legs.  You develop new bowel or bladder control problems.  You have unusual weakness or numbness in your arms or legs.  You develop nausea or vomiting.  You develop abdominal pain.  You feel faint. Document Released: 12/11/2005 Document Revised: 06/11/2012 Document Reviewed: 05/01/2011 [employer] Patient Information 2013 [employer].  

## 2012-12-10 NOTE — Addendum Note (Signed)
Addended by: Silvio Pate D on: 12/10/2012 03:46 PM   Modules accepted: Orders

## 2012-12-11 LAB — BASIC METABOLIC PANEL
BUN: 17 mg/dL (ref 6–23)
GFR: 64.64 mL/min (ref >60.00)
Potassium: 4.2 mEq/L (ref 3.5–5.1)
Sodium: 138 mEq/L (ref 135–145)

## 2012-12-11 LAB — HEPATIC FUNCTION PANEL
ALT: 26 U/L (ref 0–53)
Albumin: 4.5 g/dL (ref 3.5–5.2)
Bilirubin, Direct: 0.2 mg/dL (ref 0.0–0.3)
Total Protein: 7.1 g/dL (ref 6.0–8.3)

## 2012-12-11 LAB — LIPID PANEL
Cholesterol: 110 mg/dL (ref 0–200)
Triglycerides: 89 mg/dL (ref 0.0–149.0)
VLDL: 17.8 mg/dL (ref 0.0–40.0)

## 2012-12-13 ENCOUNTER — Other Ambulatory Visit (INDEPENDENT_AMBULATORY_CARE_PROVIDER_SITE_OTHER): Payer: Medicare Other

## 2012-12-13 DIAGNOSIS — Z0000 Encounter for general adult medical examination without abnormal findings: Secondary | ICD-10-CM | POA: Diagnosis not present

## 2012-12-19 ENCOUNTER — Encounter: Payer: Self-pay | Admitting: *Deleted

## 2013-01-04 ENCOUNTER — Other Ambulatory Visit: Payer: Self-pay | Admitting: Family Medicine

## 2013-01-21 ENCOUNTER — Ambulatory Visit (INDEPENDENT_AMBULATORY_CARE_PROVIDER_SITE_OTHER): Payer: Medicare Other | Admitting: Family Medicine

## 2013-01-21 ENCOUNTER — Encounter: Payer: Self-pay | Admitting: Family Medicine

## 2013-01-21 ENCOUNTER — Ambulatory Visit (INDEPENDENT_AMBULATORY_CARE_PROVIDER_SITE_OTHER)
Admission: RE | Admit: 2013-01-21 | Discharge: 2013-01-21 | Disposition: A | Discharge status: Home / Self Care | Payer: Medicare Other | Source: Ambulatory Visit | Attending: Family Medicine | Admitting: Family Medicine

## 2013-01-21 VITALS — BP 124/72 | HR 68 | Temp 97.8°F | Wt 182.8 lb

## 2013-01-21 DIAGNOSIS — W57XXXA Bitten or stung by nonvenomous insect and other nonvenomous arthropods, initial encounter: Secondary | ICD-10-CM

## 2013-01-21 DIAGNOSIS — M5137 Other intervertebral disc degeneration, lumbosacral region: Secondary | ICD-10-CM | POA: Diagnosis not present

## 2013-01-21 DIAGNOSIS — M549 Dorsalgia, unspecified: Secondary | ICD-10-CM

## 2013-01-21 DIAGNOSIS — Z8546 Personal history of malignant neoplasm of prostate: Secondary | ICD-10-CM | POA: Diagnosis not present

## 2013-01-21 DIAGNOSIS — M2550 Pain in unspecified joint: Secondary | ICD-10-CM | POA: Diagnosis not present

## 2013-01-21 DIAGNOSIS — M25559 Pain in unspecified hip: Secondary | ICD-10-CM | POA: Diagnosis not present

## 2013-01-21 DIAGNOSIS — M25859 Other specified joint disorders, unspecified hip: Secondary | ICD-10-CM | POA: Diagnosis not present

## 2013-01-21 DIAGNOSIS — M47817 Spondylosis without myelopathy or radiculopathy, lumbosacral region: Secondary | ICD-10-CM | POA: Diagnosis not present

## 2013-01-21 DIAGNOSIS — T148 Other injury of unspecified body region: Secondary | ICD-10-CM

## 2013-01-21 DIAGNOSIS — I10 Essential (primary) hypertension: Secondary | ICD-10-CM

## 2013-01-21 DIAGNOSIS — E785 Hyperlipidemia, unspecified: Secondary | ICD-10-CM

## 2013-01-21 LAB — CBC WITH DIFFERENTIAL/PLATELET
Basophils Absolute: 0 10*3/uL (ref 0.0–0.1)
Basophils Relative: 0.3 % (ref 0.0–3.0)
Eosinophils Absolute: 0.2 10*3/uL (ref 0.0–0.7)
Hemoglobin: 15.1 g/dL (ref 13.0–17.0)
Lymphocytes Relative: 28.5 % (ref 12.0–46.0)
MCHC: 34.4 g/dL (ref 30.0–36.0)
Monocytes Relative: 10.3 % (ref 3.0–12.0)
Neutro Abs: 2.9 10*3/uL (ref 1.4–7.7)
Neutrophils Relative %: 57.2 % (ref 43.0–77.0)
RBC: 4.63 Mil/uL (ref 4.22–5.81)
RDW: 13 % (ref 11.5–14.6)

## 2013-01-21 LAB — BASIC METABOLIC PANEL
CO2: 29 mEq/L (ref 19–32)
Calcium: 9.3 mg/dL (ref 8.4–10.5)
Creatinine, Ser: 1.2 mg/dL (ref 0.4–1.5)
GFR: 64.62 mL/min (ref >60.00)
Sodium: 139 mEq/L (ref 135–145)

## 2013-01-21 LAB — HEPATIC FUNCTION PANEL
AST: 30 U/L (ref 0–37)
Alkaline Phosphatase: 36 U/L — ABNORMAL LOW (ref 39–117)
Bilirubin, Direct: 0 mg/dL (ref 0.0–0.3)

## 2013-01-21 MED ORDER — DOXAZOSIN MESYLATE 4 MG PO TABS: 4.0000 mg | ORAL_TABLET | Freq: Every day | ORAL | Status: DC

## 2013-01-21 MED ORDER — FENOFIBRATE 160 MG PO TABS: 160.0000 mg | ORAL_TABLET | Freq: Every day | ORAL | Status: DC

## 2013-01-21 MED ORDER — SIMVASTATIN 40 MG PO TABS: 40.0000 mg | ORAL_TABLET | Freq: Every day | ORAL | Status: DC

## 2013-01-21 MED ORDER — METOPROLOL SUCCINATE ER 200 MG PO TB24: ORAL_TABLET | ORAL | Status: DC

## 2013-01-21 NOTE — Patient Instructions (Addendum)
Back Pain, Adult Low back pain is very common. About 1 in 5 people have back pain.The cause of low back pain is rarely dangerous. The pain often gets better over time.About half of people with a sudden onset of back pain feel better in just 2 weeks. About 8 in 10 people feel better by 6 weeks.  CAUSES Some common causes of back pain include:  Strain of the muscles or ligaments supporting the spine.  Wear and tear (degeneration) of the spinal discs.  Arthritis.  Direct injury to the back. DIAGNOSIS Most of the time, the direct cause of low back pain is not known.However, back pain can be treated effectively even when the exact cause of the pain is unknown.Answering your caregiver's questions about your overall health and symptoms is one of the most accurate ways to make sure the cause of your pain is not dangerous. If your caregiver needs more information, he or she may order lab work or imaging tests (X-rays or MRIs).However, even if imaging tests show changes in your back, this usually does not require surgery. HOME CARE INSTRUCTIONS For many people, back pain returns.Since low back pain is rarely dangerous, it is often a condition that people can learn to manageon their own.   Remain active. It is stressful on the back to sit or stand in one place. Do not sit, drive, or stand in one place for more than 30 minutes at a time. Take short walks on level surfaces as soon as pain allows.Try to increase the length of time you walk each day.  Do not stay in bed.Resting more than 1 or 2 days can delay your recovery.  Do not avoid exercise or work.Your body is made to move.It is not dangerous to be active, even though your back may hurt.Your back will likely heal faster if you return to being active before your pain is gone.  Pay attention to your body when you bend and lift. Many people have less discomfortwhen lifting if they bend their knees, keep the load close to their bodies,and  avoid twisting. Often, the most comfortable positions are those that put less stress on your recovering back.  Find a comfortable position to sleep. Use a firm mattress and lie on your side with your knees slightly bent. If you lie on your back, put a pillow under your knees.  Only take over-the-counter or prescription medicines as directed by your caregiver. Over-the-counter medicines to reduce pain and inflammation are often the most helpful.Your caregiver may prescribe muscle relaxant drugs.These medicines help dull your pain so you can more quickly return to your normal activities and healthy exercise.  Put ice on the injured area.  Put ice in a plastic bag.  Place a towel between your skin and the bag.  Leave the ice on for 15 to 20 minutes, 3 to 4 times a day for the first 2 to 3 days. After that, ice and heat may be alternated to reduce pain and spasms.  Ask your caregiver about trying back exercises and gentle massage. This may be of some benefit.  Avoid feeling anxious or stressed.Stress increases muscle tension and can worsen back pain.It is important to recognize when you are anxious or stressed and learn ways to manage it.Exercise is a great option. SEEK MEDICAL CARE IF:  You have pain that is not relieved with rest or medicine.  You have pain that does not improve in 1 week.  You have new symptoms.  You are generally   not feeling well. SEEK IMMEDIATE MEDICAL CARE IF:   You have pain that radiates from your back into your legs.  You develop new bowel or bladder control problems.  You have unusual weakness or numbness in your arms or legs.  You develop nausea or vomiting.  You develop abdominal pain.  You feel faint. Document Released: 12/11/2005 Document Revised: 06/11/2012 Document Reviewed: 05/01/2011 [employer] Patient Information 2013 [employer].  

## 2013-01-21 NOTE — Progress Notes (Signed)
  Subjective:    Mark Stark is a 79 y.o. male who presents for follow up of low back problems. Current symptoms include: pain in L low back (aching in character; 1/10 in severity). Symptoms have not changed from the previous visit. Exacerbating factors identified by the patient are sitting.  The following portions of the patient's history were reviewed and updated as appropriate: allergies, current medications, past family history, past medical history, past social history, past surgical history and problem list.    Objective:    BP 124/72  Pulse 68  Temp 97.8 F (36.6 C) (Oral)  Wt 182 lb 12.8 oz (82.918 kg)  SpO2 98% General appearance: alert, cooperative, appears stated age and no distress Extremities: extremities normal, atraumatic, no cyanosis or edema Lymph nodes: Cervical, supraclavicular, and axillary nodes normal. Neurologic: Alert and oriented X 3, normal strength and tone. Normal symmetric reflexes. Normal coordination and gait    Assessment:    Nonspecific acute low back pain    Plan:    Natural history and expected course discussed. Questions answered. Short (2-4 day) period of relative rest recommended until acute symptoms improve. Ice to affected area as needed for local pain relief. Heat to affected area as needed for local pain relief. xrays  Pt declined anything for pain CT from [facility] reviewed

## 2013-01-22 LAB — B. BURGDORFI ANTIBODIES: B burgdorferi Ab IgG+IgM: 0.43 {ISR}

## 2013-01-22 LAB — ANA: Anti Nuclear Antibody(ANA): NEGATIVE

## 2013-01-28 LAB — ROCKY MTN SPOTTED FVR AB, IGG-BLOOD: RMSF IgG: 3.81 IV — ABNORMAL HIGH

## 2013-02-03 ENCOUNTER — Other Ambulatory Visit: Payer: Self-pay | Admitting: Family Medicine

## 2013-02-03 DIAGNOSIS — I10 Essential (primary) hypertension: Secondary | ICD-10-CM

## 2013-02-03 NOTE — Telephone Encounter (Signed)
[facility] [address]

## 2013-03-24 DIAGNOSIS — C61 Malignant neoplasm of prostate: Secondary | ICD-10-CM | POA: Diagnosis not present

## 2013-03-28 DIAGNOSIS — C61 Malignant neoplasm of prostate: Secondary | ICD-10-CM | POA: Diagnosis not present

## 2013-06-11 DIAGNOSIS — D1801 Hemangioma of skin and subcutaneous tissue: Secondary | ICD-10-CM | POA: Diagnosis not present

## 2013-06-11 DIAGNOSIS — D235 Other benign neoplasm of skin of trunk: Secondary | ICD-10-CM | POA: Diagnosis not present

## 2013-06-11 DIAGNOSIS — L819 Disorder of pigmentation, unspecified: Secondary | ICD-10-CM | POA: Diagnosis not present

## 2013-06-24 ENCOUNTER — Other Ambulatory Visit: Payer: Self-pay | Admitting: Family Medicine

## 2013-06-25 ENCOUNTER — Other Ambulatory Visit: Payer: Self-pay

## 2013-06-25 DIAGNOSIS — E785 Hyperlipidemia, unspecified: Secondary | ICD-10-CM

## 2013-06-25 MED ORDER — SIMVASTATIN 40 MG PO TABS: ORAL_TABLET | ORAL | Status: DC

## 2013-07-27 ENCOUNTER — Other Ambulatory Visit: Payer: Self-pay | Admitting: Family Medicine

## 2013-07-30 ENCOUNTER — Other Ambulatory Visit: Payer: Self-pay

## 2013-07-30 DIAGNOSIS — I10 Essential (primary) hypertension: Secondary | ICD-10-CM

## 2013-07-30 MED ORDER — DOXAZOSIN MESYLATE 4 MG PO TABS: ORAL_TABLET | ORAL | Status: DC

## 2013-08-28 ENCOUNTER — Other Ambulatory Visit: Payer: Self-pay | Admitting: Family Medicine

## 2013-09-03 DIAGNOSIS — Z23 Encounter for immunization: Secondary | ICD-10-CM | POA: Diagnosis not present

## 2013-09-22 ENCOUNTER — Telehealth: Payer: Self-pay

## 2013-09-22 NOTE — Telephone Encounter (Signed)
LM for CB  HM due for T-dap, Flu. 2nd PNA if applicable Last EKG 09/2011

## 2013-09-23 ENCOUNTER — Ambulatory Visit (INDEPENDENT_AMBULATORY_CARE_PROVIDER_SITE_OTHER): Payer: Medicare Other | Admitting: Family Medicine

## 2013-09-23 ENCOUNTER — Encounter: Payer: Self-pay | Admitting: Family Medicine

## 2013-09-23 VITALS — BP 130/72 | HR 64 | Temp 97.8°F | Ht 68.0 in | Wt 184.8 lb

## 2013-09-23 DIAGNOSIS — G4700 Insomnia, unspecified: Secondary | ICD-10-CM

## 2013-09-23 DIAGNOSIS — E785 Hyperlipidemia, unspecified: Secondary | ICD-10-CM | POA: Diagnosis not present

## 2013-09-23 DIAGNOSIS — I10 Essential (primary) hypertension: Secondary | ICD-10-CM

## 2013-09-23 DIAGNOSIS — IMO0001 Reserved for inherently not codable concepts without codable children: Secondary | ICD-10-CM | POA: Diagnosis not present

## 2013-09-23 DIAGNOSIS — Z0000 Encounter for general adult medical examination without abnormal findings: Secondary | ICD-10-CM

## 2013-09-23 LAB — CBC WITH DIFFERENTIAL/PLATELET
Basophils Absolute: 0 10*3/uL (ref 0.0–0.1)
Basophils Relative: 0.3 % (ref 0.0–3.0)
Eosinophils Relative: 3 % (ref 0.0–5.0)
HCT: 43.5 % (ref 39.0–52.0)
Lymphocytes Relative: 29.9 % (ref 12.0–46.0)
Lymphs Abs: 1.5 10*3/uL (ref 0.7–4.0)
MCV: 94.9 fl (ref 78.0–100.0)
Monocytes Absolute: 0.5 10*3/uL (ref 0.1–1.0)
Neutrophils Relative %: 57 % (ref 43.0–77.0)
Platelets: 165 10*3/uL (ref 150.0–400.0)
RDW: 13 % (ref 11.5–14.6)
WBC: 5.2 10*3/uL (ref 4.5–10.5)

## 2013-09-23 LAB — BASIC METABOLIC PANEL
BUN: 20 mg/dL (ref 6–23)
Calcium: 8.9 mg/dL (ref 8.4–10.5)
Chloride: 106 mEq/L (ref 96–112)
Creatinine, Ser: 1.1 mg/dL (ref 0.4–1.5)
GFR: 68.63 mL/min (ref >60.00)
Glucose, Bld: 90 mg/dL (ref 70–99)
Potassium: 4.3 mEq/L (ref 3.5–5.1)

## 2013-09-23 LAB — LIPID PANEL
Cholesterol: 112 mg/dL (ref 0–200)
HDL: 35.9 mg/dL — ABNORMAL LOW (ref >39.00)
LDL Cholesterol: 57 mg/dL (ref 0–99)
Triglycerides: 98 mg/dL (ref 0.0–149.0)
VLDL: 19.6 mg/dL (ref 0.0–40.0)

## 2013-09-23 LAB — HEPATIC FUNCTION PANEL
Bilirubin, Direct: 0.1 mg/dL (ref 0.0–0.3)
Total Bilirubin: 0.8 mg/dL (ref 0.3–1.2)
Total Protein: 6.8 g/dL (ref 6.0–8.3)

## 2013-09-23 MED ORDER — CLORAZEPATE DIPOTASSIUM 7.5 MG PO TABS: 7.5000 mg | ORAL_TABLET | Freq: Two times a day (BID) | ORAL | Status: DC | PRN

## 2013-09-23 MED ORDER — FENOFIBRATE 160 MG PO TABS: 160.0000 mg | ORAL_TABLET | Freq: Every day | ORAL | Status: DC

## 2013-09-23 MED ORDER — DOXAZOSIN MESYLATE 4 MG PO TABS: ORAL_TABLET | ORAL | Status: DC

## 2013-09-23 MED ORDER — METOPROLOL SUCCINATE ER 200 MG PO TB24: ORAL_TABLET | ORAL | Status: DC

## 2013-09-23 MED ORDER — SIMVASTATIN 40 MG PO TABS: ORAL_TABLET | ORAL | Status: DC

## 2013-09-23 NOTE — Progress Notes (Signed)
Subjective:    Mark Stark. is a 79 y.o. male who presents for Medicare Annual/Subsequent preventive examination.   Preventive Screening-Counseling & Management  Tobacco History  Smoking status  . Never Smoker   Smokeless tobacco  . Never Used    Problems Prior to Visit 1. Aches and pains--  Pain in L wrist and myalgias in legs  Current Problems (verified) Patient Active Problem List   Diagnosis Date Noted  . Fungal ear infection 04/09/2012  . Abnormal laboratory test 03/27/2011  . AI (aortic insufficiency) 03/27/2011  . SYSTOLIC MURMUR 03/05/2011  . EDEMA 12/05/2010  . ECCHYMOSES, SPONTANEOUS 11/28/2010  . CONTUSION, RIGHT THIGH 11/28/2010  . DECREASED HEARING, BILATERAL 09/23/2010  . Pain in soft tissues of limb 07/28/2009  . ANKLE SPRAIN, LEFT 07/28/2009  . CONTUSION, LOWER LEG, LEFT 07/28/2009  . DERMATITIS, CONTACT, NOS 10/02/2007  . HYPERTENSION 05/07/2007  . PROSTATE CANCER, HX OF 03/13/2007  . HYPERLIPIDEMIA NEC/NOS 01/15/2007  . ANXIETY 01/15/2007  . DIVERTICULITIS, HX OF 01/15/2007    Medications Prior to Visit Current Outpatient Prescriptions on File Prior to Visit  Medication Sig Dispense Refill  . aspirin 81 MG tablet Take 81 mg by mouth daily.        [name] b complex vitamins tablet Take 1 tablet by mouth daily.        . Calcium Carbonate-Vitamin D (CALTRATE 600+D) 600-400 MG-UNIT per tablet Take 1 tablet by mouth daily.        . clorazepate (TRANXENE) 15 MG tablet TAKE 1 TABLET BY MOUTH EVERY DAY  30 tablet  0  . cromolyn (NASALCROM) 5.2 MG/ACT nasal spray 2 sprays by Nasal route 2 (two) times daily.        . fish oil-omega-3 fatty acids 1000 MG capsule Take 4 capsules by mouth daily.        No current facility-administered medications on file prior to visit.    Current Medications (verified) Current Outpatient Prescriptions  Medication Sig Dispense Refill  . aspirin 81 MG tablet Take 81 mg by mouth daily.        [name] b complex vitamins tablet Take  1 tablet by mouth daily.        . Calcium Carbonate-Vitamin D (CALTRATE 600+D) 600-400 MG-UNIT per tablet Take 1 tablet by mouth daily.        . clorazepate (TRANXENE) 15 MG tablet TAKE 1 TABLET BY MOUTH EVERY DAY  30 tablet  0  . cromolyn (NASALCROM) 5.2 MG/ACT nasal spray 2 sprays by Nasal route 2 (two) times daily.        [name] doxazosin (CARDURA) 4 MG tablet TAKE 1 TABLET (4 MG TOTAL) BY MOUTH AT BEDTIME.  90 tablet  3  . fenofibrate 160 MG tablet Take 1 tablet (160 mg total) by mouth daily.  90 tablet  3  . fish oil-omega-3 fatty acids 1000 MG capsule Take 4 capsules by mouth daily.       . metoprolol (TOPROL-XL) 200 MG 24 hr tablet Take 1/2 tablet by mouth once daily  90 tablet  3  . simvastatin (ZOCOR) 40 MG tablet 1 tab by motuh daily  90 tablet  3  . clorazepate (TRANXENE) 7.5 MG tablet Take 1 tablet (7.5 mg total) by mouth 2 (two) times daily as needed for anxiety.  30 tablet  5   No current facility-administered medications for this visit.     Allergies (verified) Sulfonamide derivatives   PAST HISTORY  Family History Family History  Problem  Relation Age of Onset  . Dementia Mother   . Alzheimer's disease Mother   . Mental illness Mother     ALZ DISEASE    Social History History  Substance Use Topics  . Smoking status: Never Smoker   . Smokeless tobacco: Never Used  . Alcohol Use: No    Are there smokers in your home (other than you)?  No  Risk Factors Current exercise habits: walks 50 min  Dietary issues discussed: na   Cardiac risk factors: advanced age (older than 22 for men, 68 for women), dyslipidemia, hypertension and male gender.  Depression Screen (Note: if answer to either of the following is "Yes", a more complete depression screening is indicated)   Q1: Over the past two weeks, have you felt down, depressed or hopeless? No  Q2: Over the past two weeks, have you felt little interest or pleasure in doing things? No  Have you lost interest or pleasure in  daily life? No  Do you often feel hopeless? No  Do you cry easily over simple problems? No  Activities of Daily Living In your present state of health, do you have any difficulty performing the following activities?:  Driving? No Managing money?  No Feeding yourself? No Getting from bed to chair? No Climbing a flight of stairs? No Preparing food and eating?: No Bathing or showering? No Getting dressed: No Getting to the toilet? No Using the toilet:No Moving around from place to place: No In the past year have you fallen or had a near fall?:No   Are you sexually active?  Yes  Do you have more than one partner?  No  Hearing Difficulties: No-- with hearing aids  Do you often ask people to speak up or repeat themselves? No Do you experience ringing or noises in your ears? No Do you have difficulty understanding soft or whispered voices? No   Do you feel that you have a problem with memory? No  Do you often misplace items? No  Do you feel safe at home?  Yes  Cognitive Testing  Alert? Yes  Normal Appearance?Yes  Oriented to person? Yes  Place? Yes   Time? Yes  Recall of three objects?  Yes  Can perform simple calculations? Yes  Displays appropriate judgment?Yes  Can read the correct time from a watch face?Yes   Advanced Directives have been discussed with the patient? Yes   List the Names of Other Physician/Practitioners you currently use: 1.  opth-Dr McCuen 2   Dentist--[place] 3  Derm--[place] 4 urologist- [facility] 5  Indicate any recent Medical Services you may have received from other than [facility] providers in the past year (date may be approximate).  Immunization History  Administered Date(s) Administered  . Influenza Whole 11/14/2007, 09/30/2008, 09/08/2009, 08/31/2010, 09/03/2013  . Pneumococcal Polysaccharide 09/22/2009  . Zoster 03/19/2008    Screening Tests Health Maintenance  Topic Date Due  . Tetanus/tdap  07/02/1947  . Influenza Vaccine  07/25/2014  .  Pneumococcal Polysaccharide Vaccine Age 32 And Over  Completed  . Zostavax  Completed    All answers were reviewed with the patient and necessary referrals were made:  Loreen Freud, DO   09/23/2013   History reviewed:  He  has a past medical history of Anxiety; Diverticulitis; Hypertension; Inferior mesenteric vein thrombosis; Dyslipidemia; and Prostate cancer. He  does not have any pertinent problems on file. He  has past surgical history that includes Cataract extraction (01/27/2010); Cataract extraction (03/04/2010); Prostate seed implants; and Prostate surgery.  His family history includes Alzheimer's disease in his mother; Dementia in his mother; Mental illness in his mother. He  reports that he has never smoked. He has never used smokeless tobacco. He reports that he does not drink alcohol or use illicit drugs. He has a current medication list which includes the following prescription(s): aspirin, b complex vitamins, calcium carbonate-vitamin d, clorazepate, cromolyn, doxazosin, fenofibrate, fish oil-omega-3 fatty acids, metoprolol, simvastatin, and clorazepate. Current Outpatient Prescriptions on File Prior to Visit  Medication Sig Dispense Refill  . aspirin 81 MG tablet Take 81 mg by mouth daily.        [name] b complex vitamins tablet Take 1 tablet by mouth daily.        . Calcium Carbonate-Vitamin D (CALTRATE 600+D) 600-400 MG-UNIT per tablet Take 1 tablet by mouth daily.        . clorazepate (TRANXENE) 15 MG tablet TAKE 1 TABLET BY MOUTH EVERY DAY  30 tablet  0  . cromolyn (NASALCROM) 5.2 MG/ACT nasal spray 2 sprays by Nasal route 2 (two) times daily.        . fish oil-omega-3 fatty acids 1000 MG capsule Take 4 capsules by mouth daily.        No current facility-administered medications on file prior to visit.   He is allergic to sulfonamide derivatives.  Review of Systems  Review of Systems  Constitutional: Negative for activity change, appetite change and fatigue.  HENT:  Negative for hearing loss, congestion, tinnitus and ear discharge.   Eyes: Negative for visual disturbance (see optho q1y -- vision corrected to 20/20 with glasses).  Respiratory: Negative for cough, chest tightness and shortness of breath.   Cardiovascular: Negative for chest pain, palpitations and leg swelling.  Gastrointestinal: Negative for abdominal pain, diarrhea, constipation and abdominal distention.  Genitourinary: Negative for urgency, frequency, decreased urine volume and difficulty urinating.  Musculoskeletal: Negative for back pain, arthralgias and gait problem.  Skin: Negative for color change, pallor and rash.  Neurological: Negative for dizziness, light-headedness, numbness and headaches.  Hematological: Negative for adenopathy. Does not bruise/bleed easily.  Psychiatric/Behavioral: Negative for suicidal ideas, confusion, sleep disturbance, self-injury, dysphoric mood, decreased concentration and agitation.  Pt is able to read and write and can do all ADLs No risk for falling No abuse/ violence in home      Objective:     Vision by Snellen chart: opth Blood pressure 130/72, pulse 64, temperature 97.8 F (36.6 C), temperature source Oral, height 5\' 8"  (1.727 m), weight 184 lb 12.8 oz (83.825 kg), SpO2 98.00%. Body mass index is 28.11 kg/(m^2).  BP 130/72  Pulse 64  Temp(Src) 97.8 F (36.6 C) (Oral)  Ht 5\' 8"  (1.727 m)  Wt 184 lb 12.8 oz (83.825 kg)  BMI 28.11 kg/m2  SpO2 98% General appearance: alert, cooperative, appears stated age and no distress Head: Normocephalic, without obvious abnormality, atraumatic Eyes: conjunctivae/corneas clear. PERRL, EOM's intact. Fundi benign. Ears: normal TM's and external ear canals both ears Nose: Nares normal. Septum midline. Mucosa normal. No drainage or sinus tenderness. Throat: lips, mucosa, and tongue normal; teeth and gums normal Neck: no adenopathy, no carotid bruit, no JVD, supple, symmetrical, trachea midline and  thyroid not enlarged, symmetric, no tenderness/mass/nodules Back: symmetric, no curvature. ROM normal. No CVA tenderness. Lungs: clear to auscultation bilaterally Chest wall: no tenderness Heart: regular rate and rhythm, S1, S2 normal, no murmur, click, rub or gallop Abdomen: soft, non-tender; bowel sounds normal; no masses,  no organomegaly Male genitalia: urology Rectal: urology Extremities:  extremities normal, atraumatic, no cyanosis or edema Pulses: 2+ and symmetric Skin: Skin color, texture, turgor normal. No rashes or lesions Lymph nodes: Cervical, supraclavicular, and axillary nodes normal. Neurologic: Alert and oriented X 3, normal strength and tone. Normal symmetric reflexes. Normal coordination and gait Psych- no anxiety, no depression      Assessment:     cpe      Plan:     During the course of the visit the patient was educated and counseled about appropriate screening and preventive services including:    Pneumococcal vaccine   Influenza vaccine  Hepatitis B vaccine  Prostate cancer screening  Glaucoma screening  Advanced directives: has an advanced directive - a copy HAS NOT been provided.  Diet review for nutrition referral? Yes ____  Not Indicated ____   Patient Instructions (the written plan) was given to the patient.  Medicare Attestation I have personally reviewed: The patient's medical and social history Their use of alcohol, tobacco or illicit drugs Their current medications and supplements The patient's functional ability including ADLs,fall risks, home safety risks, cognitive, and hearing and visual impairment Diet and physical activities Evidence for depression or mood disorders  The patient's weight, height, BMI, and visual acuity have been recorded in the chart.  I have made referrals, counseling, and provided education to the patient based on review of the above and I have provided the patient with a written personalized care plan for  preventive services.     Loreen Freud, DO   09/23/2013

## 2013-09-23 NOTE — Telephone Encounter (Signed)
Unable to reach prior to visit  

## 2013-09-23 NOTE — Patient Instructions (Signed)
Preventive Care for Adults, Male  A healthy lifestyle and preventive care can promote health and wellness. Preventive health guidelines for men include the following key practices:  · A routine yearly physical is a good way to check with your caregiver about your health and preventative screening. It is a chance to share any concerns and updates on your health, and to receive a thorough exam.  · Visit your dentist for a routine exam and preventative care every 6 months. Brush your teeth twice a day and floss once a day. Good oral hygiene prevents tooth decay and gum disease.  · The frequency of eye exams is based on your age, health, family medical history, use of contact lenses, and other factors. Follow your caregiver's recommendations for frequency of eye exams.  · Eat a healthy diet. Foods like vegetables, fruits, whole grains, low-fat dairy products, and lean protein foods contain the nutrients you need without too many calories. Decrease your intake of foods high in solid fats, added sugars, and salt. Eat the right amount of calories for you. Get information about a proper diet from your caregiver, if necessary.  · Regular physical exercise is one of the most important things you can do for your health. Most adults should get at least 150 minutes of moderate-intensity exercise (any activity that increases your heart rate and causes you to sweat) each week. In addition, most adults need muscle-strengthening exercises on 2 or more days a week.  · Maintain a healthy weight. The body mass index (BMI) is a screening tool to identify possible weight problems. It provides an estimate of body fat based on height and weight. Your caregiver can help determine your BMI, and can help you achieve or maintain a healthy weight. For adults 20 years and older:  · A BMI below 18.5 is considered underweight.  · A BMI of 18.5 to 24.9 is normal.  · A BMI of 25 to 29.9 is considered overweight.  · A BMI of 30 and above is  considered obese.  · Maintain normal blood lipids and cholesterol levels by exercising and minimizing your intake of saturated fat. Eat a balanced diet with plenty of fruit and vegetables. Blood tests for lipids and cholesterol should begin at age 20 and be repeated every 5 years. If your lipid or cholesterol levels are high, you are over 50, or you are a high risk for heart disease, you may need your cholesterol levels checked more frequently. Ongoing high lipid and cholesterol levels should be treated with medicines if diet and exercise are not effective.  · If you smoke, find out from your caregiver how to quit. If you do not use tobacco, do not start.  · If you choose to drink alcohol, do not exceed 2 drinks per day. One drink is considered to be 12 ounces (355 mL) of beer, 5 ounces (148 mL) of wine, or 1.5 ounces (44 mL) of liquor.  · Avoid use of street drugs. Do not share needles with anyone. Ask for help if you need support or instructions about stopping the use of drugs.  · High blood pressure causes heart disease and increases the risk of stroke. Your blood pressure should be checked at least every 1 to 2 years. Ongoing high blood pressure should be treated with medicines, if weight loss and exercise are not effective.  · If you are 45 to 77 years old, ask your caregiver if you should take aspirin to prevent heart disease.  · Diabetes screening involves taking   a blood sample to check your fasting blood sugar level. This should be done once every 3 years, after age 45, if you are within normal weight and without risk factors for diabetes. Testing should be considered at a younger age or be carried out more frequently if you are overweight and have at least 1 risk factor for diabetes.  · Colorectal cancer can be detected and often prevented. Most routine colorectal cancer screening begins at the age of 50 and continues through age 75. However, your caregiver may recommend screening at an earlier age if you  have risk factors for colon cancer. On a yearly basis, your caregiver may provide home test kits to check for hidden blood in the stool. Use of a small camera at the end of a tube, to directly examine the colon (sigmoidoscopy or colonoscopy), can detect the earliest forms of colorectal cancer. Talk to your caregiver about this at age 50, when routine screening begins.  Direct examination of the colon should be repeated every 5 to 10 years through age 75, unless early forms of pre-cancerous polyps or small growths are found.  · Hepatitis C blood testing is recommended for all people born from 1945 through 1965 and any individual with known risks for hepatitis C.  · Practice safe sex. Use condoms and avoid high-risk sexual practices to reduce the spread of sexually transmitted infections (STIs). STIs include gonorrhea, chlamydia, syphilis, trichomonas, herpes, HPV, and human immunodeficiency virus (HIV). Herpes, HIV, and HPV are viral illnesses that have no cure. They can result in disability, cancer, and death.  · A one-time screening for abdominal aortic aneurysm (AAA) and surgical repair of large AAAs by sound wave imaging (ultrasonography) is recommended for ages 65 to 75 years who are current or former smokers.  · Healthy men should no longer receive prostate-specific antigen (PSA) blood tests as part of routine cancer screening. Consult with your caregiver about prostate cancer screening.  · Testicular cancer screening is not recommended for adult males who have no symptoms. Screening includes self-exam, caregiver exam, and other screening tests. Consult with your caregiver about any symptoms you have or any concerns you have about testicular cancer.  · Use sunscreen with skin protection factor (SPF) of 30 or more. Apply sunscreen liberally and repeatedly throughout the day. You should seek shade when your shadow is shorter than you. Protect yourself by wearing long sleeves, pants, a wide-brimmed hat, and  sunglasses year round, whenever you are outdoors.  · Once a month, do a whole body skin exam, using a mirror to look at the skin on your back. Notify your caregiver of new moles, moles that have irregular borders, moles that are larger than a pencil eraser, or moles that have changed in shape or color.  · Stay current with required immunizations.  · Influenza. You need a dose every fall (or winter). The composition of the flu vaccine changes each year, so being vaccinated once is not enough.  · Pneumococcal polysaccharide. You need 1 to 2 doses if you smoke cigarettes or if you have certain chronic medical conditions. You need 1 dose at age 65 (or older) if you have never been vaccinated.  · Tetanus, diphtheria, pertussis (Tdap, Td). Get 1 dose of Tdap vaccine if you are younger than age 65 years, are over 65 and have contact with an infant, are a healthcare worker, or simply want to be protected from whooping cough. After that, you need a Td booster dose every 10 years. Consult your   caregiver if you have not had at least 3 tetanus and diphtheria-containing shots sometime in your life or have a deep or dirty wound.  · HPV. This vaccine is recommended for males 13 through 77 years of age. This vaccine may be given to men 22 through 77 years of age who have not completed the 3 dose series. It is recommended for men through age 26 who have sex with men or whose immune system is weakened because of HIV infection, other illness, or medications. The vaccine is given in 3 doses over 6 months.  · Measles, mumps, rubella (MMR). You need at least 1 dose of MMR if you were born in 1957 or later. You may also need a 2nd dose.  · Meningococcal. If you are age 19 to 21 years and a first-year college student living in a residence hall, or have one of several medical conditions, you need to get vaccinated against meningococcal disease. You may also need additional booster doses.  · Zoster (shingles). If you are age 60 years or  older, you should get this vaccine.  · Varicella (chickenpox). If you have never had chickenpox or you were vaccinated but received only 1 dose, talk to your caregiver to find out if you need this vaccine.  · Hepatitis A. You need this vaccine if you have a specific risk factor for hepatitis A virus infection, or you simply wish to be protected from this disease. The vaccine is usually given as 2 doses, 6 to 18 months apart.  · Hepatitis B. You need this vaccine if you have a specific risk factor for hepatitis B virus infection or you simply wish to be protected from this disease. The vaccine is given in 3 doses, usually over 6 months.  Preventative Service / Frequency  Ages 19 to 39  · Blood pressure check.** / Every 1 to 2 years.  · Lipid and cholesterol check.** / Every 5 years beginning at age 20.  · Hepatitis C blood test.** / For any individual with known risks for hepatitis C.  · Skin self-exam. / Monthly.  · Influenza immunization.** / Every year.  · Pneumococcal polysaccharide immunization.** / 1 to 2 doses if you smoke cigarettes or if you have certain chronic medical conditions.  · Tetanus, diphtheria, pertussis (Tdap,Td) immunization. / A one-time dose of Tdap vaccine. After that, you need a Td booster dose every 10 years.  · HPV immunization. / 3 doses over 6 months, if 26 and younger.  · Measles, mumps, rubella (MMR) immunization. / You need at least 1 dose of MMR if you were born in 1957 or later. You may also need a 2nd dose.  · Meningococcal immunization. / 1 dose if you are age 19 to 21 years and a first-year college student living in a residence hall, or have one of several medical conditions, you need to get vaccinated against meningococcal disease. You may also need additional booster doses.  · Varicella immunization.** / Consult your caregiver.  · Hepatitis A immunization.** / Consult your caregiver. 2 doses, 6 to 18 months apart.  · Hepatitis B immunization.** / Consult your caregiver. 3 doses  usually over 6 months.  Ages 40 to 64  · Blood pressure check.** / Every 1 to 2 years.  · Lipid and cholesterol check.** / Every 5 years beginning at age 20.  · Fecal occult blood test (FOBT) of stool. / Every year beginning at age 50 and continuing until age 75. You may not have   to do this test if you get colonoscopy every 10 years.  · Flexible sigmoidoscopy** or colonoscopy.** / Every 5 years for a flexible sigmoidoscopy or every 10 years for a colonoscopy beginning at age 50 and continuing until age 75.  · Hepatitis C blood test.** / For all people born from 1945 through 1965 and any individual with known risks for hepatitis C.  · Skin self-exam. / Monthly.  · Influenza immunization.** / Every year.  · Pneumococcal polysaccharide immunization.** / 1 to 2 doses if you smoke cigarettes or if you have certain chronic medical conditions.  · Tetanus, diphtheria, pertussis (Tdap/Td) immunization.** / A one-time dose of Tdap vaccine. After that, you need a Td booster dose every 10 years.  · Measles, mumps, rubella (MMR) immunization.  / You need at least 1 dose of MMR if you were born in 1957 or later. You may also need a 2nd dose.  · Varicella immunization.**/ Consult your caregiver.  · Meningococcal immunization.** / Consult your caregiver.  · Hepatitis A immunization.** / Consult your caregiver. 2 doses, 6 to 18 months apart.  · Hepatitis B immunization.** / Consult your caregiver. 3 doses, usually over 6 months.  Ages 65 and over  · Blood pressure check.** / Every 1 to 2 years.  · Lipid and cholesterol check.**/ Every 5 years beginning at age 20.  · Fecal occult blood test (FOBT) of stool. / Every year beginning at age 50 and continuing until age 75. You may not have to do this test if you get colonoscopy every 10 years.  · Flexible sigmoidoscopy** or colonoscopy.** / Every 5 years for a flexible sigmoidoscopy or every 10 years for a colonoscopy beginning at age 50 and continuing until age 75.  · Hepatitis C blood  test.** / For all people born from 1945 through 1965 and any individual with known risks for hepatitis C.  · Abdominal aortic aneurysm (AAA) screening.** / A one-time screening for ages 65 to 75 years who are current or former smokers.  · Skin self-exam. / Monthly.  · Influenza immunization.** / Every year.  · Pneumococcal polysaccharide immunization.** / 1 dose at age 65 (or older) if you have never been vaccinated.  · Tetanus, diphtheria, pertussis (Tdap, Td) immunization. / A one-time dose of Tdap vaccine if you are over 65 and have contact with an infant, are a healthcare worker, or simply want to be protected from whooping cough. After that, you need a Td booster dose every 10 years.  · Varicella immunization. ** / Consult your caregiver.  · Meningococcal immunization.** / Consult your caregiver.  · Hepatitis A immunization. ** / Consult your caregiver. 2 doses, 6 to 18 months apart.  · Hepatitis B immunization.** / Check with your caregiver. 3 doses, usually over 6 months.  **Family history and personal history of risk and conditions may change your caregiver's recommendations.  Document Released: 02/06/2002 Document Revised: 03/04/2012 Document Reviewed: 05/08/2011  Exit[employer]® Patient Information ©2014 [employer].

## 2013-09-24 LAB — CK TOTAL AND CKMB (NOT AT ARMC)

## 2013-11-13 ENCOUNTER — Encounter: Payer: Self-pay | Admitting: Lab

## 2013-11-14 ENCOUNTER — Ambulatory Visit (INDEPENDENT_AMBULATORY_CARE_PROVIDER_SITE_OTHER): Payer: Medicare Other | Admitting: Family Medicine

## 2013-11-14 ENCOUNTER — Encounter: Payer: Self-pay | Admitting: Family Medicine

## 2013-11-14 VITALS — BP 132/78 | HR 64 | Wt 182.0 lb

## 2013-11-14 DIAGNOSIS — IMO0001 Reserved for inherently not codable concepts without codable children: Secondary | ICD-10-CM | POA: Diagnosis not present

## 2013-11-14 DIAGNOSIS — E785 Hyperlipidemia, unspecified: Secondary | ICD-10-CM

## 2013-11-14 DIAGNOSIS — M791 Myalgia, unspecified site: Secondary | ICD-10-CM

## 2013-11-14 LAB — LIPID PANEL
LDL Cholesterol: 119 mg/dL — ABNORMAL HIGH (ref 0–99)
VLDL: 23.8 mg/dL (ref 0.0–40.0)

## 2013-11-14 LAB — HEPATIC FUNCTION PANEL
ALT: 24 U/L (ref 0–53)
AST: 28 U/L (ref 0–37)
Albumin: 4.5 g/dL (ref 3.5–5.2)
Alkaline Phosphatase: 27 U/L — ABNORMAL LOW (ref 39–117)
Bilirubin, Direct: 0 mg/dL (ref 0.0–0.3)
Total Protein: 7 g/dL (ref 6.0–8.3)

## 2013-11-14 LAB — RHEUMATOID FACTOR: Rhuematoid fact SerPl-aCnc: <10 IU/mL (ref <=14)

## 2013-11-14 LAB — SEDIMENTATION RATE: Sed Rate: 1 mm/hr (ref 0–22)

## 2013-11-14 MED ORDER — MELOXICAM 15 MG PO TABS: ORAL_TABLET | ORAL | Status: DC

## 2013-11-14 MED ORDER — SIMVASTATIN 20 MG PO TABS: 20.0000 mg | ORAL_TABLET | Freq: Every day | ORAL | Status: DC

## 2013-11-14 NOTE — Patient Instructions (Signed)
Myalgia, Adult °Myalgia is the medical term for muscle pain. It is a symptom of many things. Nearly everyone at some time in their life has this. The most common cause for muscle pain is overuse or straining and more so when you are not in shape. Injuries and muscle bruises cause myalgias. Muscle pain without a history of injury can also be caused by a virus. It frequently comes along with the flu. Myalgia not caused by muscle strain can be present in a large number of infectious diseases. Some autoimmune diseases like lupus and fibromyalgia can cause muscle pain. Myalgia may be mild, or severe. °SYMPTOMS  °The symptoms of myalgia are simply muscle pain. Most of the time this is short lived and the pain goes away without treatment. °DIAGNOSIS  °Myalgia is diagnosed by your caregiver by taking your history. This means you tell him when the problems began, what they are, and what has been happening. If this has not been a long term problem, your caregiver may want to watch for a while to see what will happen. If it has been long term, they may want to do additional testing. °TREATMENT  °The treatment depends on what the underlying cause of the muscle pain is. Often anti-inflammatory medications will help. °HOME CARE INSTRUCTIONS °· If the pain in your muscles came from overuse, slow down your activities until the problems go away. °· Myalgia from overuse of a muscle can be treated with alternating hot and cold packs on the muscle affected or with cold for the first couple days. If either heat or cold seems to make things worse, stop their use. °· Apply ice to the sore area for 15-20 minutes, 03-04 times per day, while awake for the first 2 days of muscle soreness, or as directed. Put the ice in a plastic bag and place a towel between the bag of ice and your skin. °· Only take over-the-counter or prescription medicines for pain, discomfort, or fever as directed by your caregiver. °· Regular gentle exercise may help if  you are not active. °· Stretching before strenuous exercise can help lower the risk of myalgia. It is normal when beginning an exercise regimen to feel some muscle pain after exercising. Muscles that have not been used frequently will be sore at first. If the pain is extreme, this may mean injury to a muscle. °SEEK MEDICAL CARE IF: °· You have an increase in muscle pain that is not relieved with medication. °· You begin to run a temperature. °· You develop nausea and vomiting. °· You develop a stiff and painful neck. °· You develop a rash. °· You develop muscle pain after a tick bite. °· You have continued muscle pain while working out even after you are in good condition. °SEEK IMMEDIATE MEDICAL CARE IF: °Any of your problems are getting worse and medications are not helping. °MAKE SURE YOU:  °· Understand these instructions. °· Will watch your condition. °· Will get help right away if you are not doing well or get worse. °Document Released: 11/02/2006 Document Revised: 03/04/2012 Document Reviewed: 01/22/2007 °ExitCare® Patient Information ©2014 [employer]. ° °

## 2013-11-14 NOTE — Progress Notes (Signed)
Pre visit review using our clinic review tool, if applicable. No additional management support is needed unless otherwise documented below in the visit note. 

## 2013-11-14 NOTE — Progress Notes (Signed)
  Subjective:    Patient ID: Mark Munson., male    DOB: 01-Aug-1928, 79 y.o.   MRN: [id number]  HPI Pt here c/o muscle aches and joint pains that are no better since stopping zocor.  This has been ongoing since at least January.  Pt is only with weight bearing in legs.  No swelling, no errythema.  No back pain.      Review of Systems As above    Objective:   Physical Exam  BP 132/78  Pulse 64  Wt 182 lb (82.555 kg)  SpO2 98% General appearance: alert, cooperative, appears stated age and no distress Neck: no adenopathy, supple, symmetrical, trachea midline and thyroid not enlarged, symmetric, no tenderness/mass/nodules Lungs: clear to auscultation bilaterally Heart: S1, S2 normal Extremities: extremities normal, atraumatic, no cyanosis or edema Neurologic: Alert and oriented X 3, normal strength and tone. Normal symmetric reflexes. Normal coordination and gait----pain in both legs with walking but no other abnormality      Assessment & Plan:

## 2013-11-16 DIAGNOSIS — IMO0001 Reserved for inherently not codable concepts without codable children: Secondary | ICD-10-CM | POA: Insufficient documentation

## 2013-11-16 NOTE — Assessment & Plan Note (Signed)
Not secondary to statin Check labs Consider rheum referral

## 2013-11-17 DIAGNOSIS — H353290 Exudative age-related macular degeneration, unspecified eye, stage unspecified: Secondary | ICD-10-CM | POA: Diagnosis not present

## 2013-11-17 DIAGNOSIS — H52209 Unspecified astigmatism, unspecified eye: Secondary | ICD-10-CM | POA: Diagnosis not present

## 2013-11-17 DIAGNOSIS — Z961 Presence of intraocular lens: Secondary | ICD-10-CM | POA: Diagnosis not present

## 2013-11-17 LAB — ANA: Anti Nuclear Antibody(ANA): NEGATIVE

## 2013-11-24 ENCOUNTER — Telehealth: Payer: Self-pay | Admitting: *Deleted

## 2013-11-24 NOTE — Telephone Encounter (Signed)
Great!

## 2013-11-24 NOTE — Telephone Encounter (Signed)
Patient called and stated that the meloxicam believes its working like it suppose to. Patient states that the only problem that he has had was abdominal pain and frequent urination. Patient states that he only took the medication from Nov.21st -Nov.28th. He also states that the pain has remained gone since he last took the medication.

## 2014-01-19 ENCOUNTER — Other Ambulatory Visit: Payer: Self-pay | Admitting: Family Medicine

## 2014-01-19 DIAGNOSIS — I10 Essential (primary) hypertension: Secondary | ICD-10-CM

## 2014-01-20 NOTE — Telephone Encounter (Signed)
Refill for cardura sent to [facility]

## 2014-03-23 DIAGNOSIS — C61 Malignant neoplasm of prostate: Secondary | ICD-10-CM | POA: Diagnosis not present

## 2014-03-30 DIAGNOSIS — C61 Malignant neoplasm of prostate: Secondary | ICD-10-CM | POA: Diagnosis not present

## 2014-04-13 ENCOUNTER — Other Ambulatory Visit: Payer: Self-pay | Admitting: Family Medicine

## 2014-04-15 ENCOUNTER — Other Ambulatory Visit: Payer: Self-pay | Admitting: Family Medicine

## 2014-04-16 NOTE — Telephone Encounter (Signed)
Rx re-faxed manually.       KP 

## 2014-04-30 DIAGNOSIS — H35379 Puckering of macula, unspecified eye: Secondary | ICD-10-CM | POA: Diagnosis not present

## 2014-04-30 DIAGNOSIS — H353211 Exudative age-related macular degeneration, right eye, with active choroidal neovascularization: Secondary | ICD-10-CM | POA: Insufficient documentation

## 2014-04-30 DIAGNOSIS — H43819 Vitreous degeneration, unspecified eye: Secondary | ICD-10-CM | POA: Diagnosis not present

## 2014-04-30 DIAGNOSIS — H353190 Nonexudative age-related macular degeneration, unspecified eye, stage unspecified: Secondary | ICD-10-CM | POA: Diagnosis not present

## 2014-04-30 DIAGNOSIS — Z961 Presence of intraocular lens: Secondary | ICD-10-CM | POA: Diagnosis not present

## 2014-06-11 DIAGNOSIS — D485 Neoplasm of uncertain behavior of skin: Secondary | ICD-10-CM | POA: Diagnosis not present

## 2014-06-11 DIAGNOSIS — D235 Other benign neoplasm of skin of trunk: Secondary | ICD-10-CM | POA: Diagnosis not present

## 2014-06-11 DIAGNOSIS — L570 Actinic keratosis: Secondary | ICD-10-CM | POA: Diagnosis not present

## 2014-06-11 DIAGNOSIS — L259 Unspecified contact dermatitis, unspecified cause: Secondary | ICD-10-CM | POA: Diagnosis not present

## 2014-06-11 DIAGNOSIS — D1801 Hemangioma of skin and subcutaneous tissue: Secondary | ICD-10-CM | POA: Diagnosis not present

## 2014-09-10 ENCOUNTER — Other Ambulatory Visit: Payer: Self-pay | Admitting: Family Medicine

## 2014-09-11 ENCOUNTER — Other Ambulatory Visit: Payer: Self-pay | Admitting: Family Medicine

## 2014-09-11 NOTE — Telephone Encounter (Signed)
Prescription printed, signed by Dr. Etter Sjogren and faxed to [facility] on [address].  Fax confirmation received.  Called and made patient aware.  No further questions or concerns voiced.

## 2014-09-11 NOTE — Telephone Encounter (Signed)
Last seen 11/14/13 and filled 04/13/14 #60 with 1 refill. Please advise     KP

## 2014-09-14 MED ORDER — CLORAZEPATE DIPOTASSIUM 7.5 MG PO TABS: ORAL_TABLET | ORAL | Status: DC

## 2014-09-14 NOTE — Addendum Note (Signed)
Addended by: Ewing Schlein on: 09/14/2014 10:22 AM   Modules accepted: Orders

## 2014-09-14 NOTE — Telephone Encounter (Signed)
Rx Faxed    KP 

## 2014-09-15 ENCOUNTER — Telehealth: Payer: Self-pay | Admitting: *Deleted

## 2014-09-15 NOTE — Telephone Encounter (Signed)
[facility] Triage Call Report Triage Record Num: [id number] Operator: Kennon Rounds Patient Name: Mark Stark Call Date & Time: 09/11/2014 9:23:36PM Patient Phone: [phone] PCP: Rosalita Chessman Patient Gender: Male PCP Fax : [phone] Patient DOB: 05/09/1928 Practice Name: [facility] Reason for Call: Caller: [name]/Spouse; PCP: Rosalita Chessman.; CB#: [phone]; Call regarding caller states that she had 2 calls from the office number; Looked under her name and her husbands name in [place]; Made caller aware that there was a call at 4:44pm today (09/11/14) regarding a prescription for caller's husband that had been sent to pharmacy. Protocol(s) Used: Office Note Recommended Outcome per Protocol: Information Noted and Sent to Office Reason for Outcome: Caller information to office Care Advice: ~ 09/11/2014 9:35:57PM Page 1 of 1 [email or web address]

## 2014-09-22 DIAGNOSIS — C61 Malignant neoplasm of prostate: Secondary | ICD-10-CM | POA: Diagnosis not present

## 2014-09-29 DIAGNOSIS — C61 Malignant neoplasm of prostate: Secondary | ICD-10-CM | POA: Diagnosis not present

## 2014-10-12 ENCOUNTER — Other Ambulatory Visit: Payer: Self-pay

## 2014-10-12 DIAGNOSIS — E785 Hyperlipidemia, unspecified: Secondary | ICD-10-CM

## 2014-10-12 DIAGNOSIS — I10 Essential (primary) hypertension: Secondary | ICD-10-CM

## 2014-10-12 MED ORDER — FENOFIBRATE 160 MG PO TABS: 160.0000 mg | ORAL_TABLET | Freq: Every day | ORAL | Status: DC

## 2014-10-12 MED ORDER — METOPROLOL SUCCINATE ER 200 MG PO TB24: ORAL_TABLET | ORAL | Status: DC

## 2014-11-10 ENCOUNTER — Other Ambulatory Visit: Payer: Self-pay

## 2014-11-10 DIAGNOSIS — E785 Hyperlipidemia, unspecified: Secondary | ICD-10-CM

## 2014-11-10 DIAGNOSIS — I10 Essential (primary) hypertension: Secondary | ICD-10-CM

## 2014-11-10 MED ORDER — DOXAZOSIN MESYLATE 4 MG PO TABS: ORAL_TABLET | ORAL | Status: DC

## 2014-11-10 MED ORDER — SIMVASTATIN 20 MG PO TABS: 20.0000 mg | ORAL_TABLET | Freq: Every day | ORAL | Status: DC

## 2014-11-10 MED ORDER — CLORAZEPATE DIPOTASSIUM 7.5 MG PO TABS: ORAL_TABLET | ORAL | Status: DC

## 2014-11-10 NOTE — Addendum Note (Signed)
Addended by: Ewing Schlein on: 11/10/2014 04:41 PM   Modules accepted: Orders

## 2014-12-08 ENCOUNTER — Encounter: Payer: Self-pay | Admitting: Family Medicine

## 2014-12-08 ENCOUNTER — Ambulatory Visit (INDEPENDENT_AMBULATORY_CARE_PROVIDER_SITE_OTHER): Payer: Medicare Other | Admitting: Family Medicine

## 2014-12-08 VITALS — BP 154/80 | HR 77 | Temp 97.8°F | Ht 68.0 in | Wt 182.4 lb

## 2014-12-08 DIAGNOSIS — F411 Generalized anxiety disorder: Secondary | ICD-10-CM | POA: Diagnosis not present

## 2014-12-08 DIAGNOSIS — I10 Essential (primary) hypertension: Secondary | ICD-10-CM

## 2014-12-08 DIAGNOSIS — Z0000 Encounter for general adult medical examination without abnormal findings: Secondary | ICD-10-CM | POA: Diagnosis not present

## 2014-12-08 DIAGNOSIS — E785 Hyperlipidemia, unspecified: Secondary | ICD-10-CM

## 2014-12-08 LAB — BASIC METABOLIC PANEL
BUN: 21 mg/dL (ref 6–23)
CHLORIDE: 104 meq/L (ref 96–112)
CO2: 27 meq/L (ref 19–32)
CREATININE: 1.1 mg/dL (ref 0.4–1.5)
Calcium: 9.6 mg/dL (ref 8.4–10.5)
GFR: 67.71 mL/min (ref >60.00)
GLUCOSE: 97 mg/dL (ref 70–99)
Potassium: 4.3 mEq/L (ref 3.5–5.1)
SODIUM: 136 meq/L (ref 135–145)

## 2014-12-08 LAB — CBC WITH DIFFERENTIAL/PLATELET
Basophils Absolute: 0 10*3/uL (ref 0.0–0.1)
Basophils Relative: 0.3 % (ref 0.0–3.0)
EOS ABS: 0.1 10*3/uL (ref 0.0–0.7)
Eosinophils Relative: 2.2 % (ref 0.0–5.0)
HCT: 45.3 % (ref 39.0–52.0)
HEMOGLOBIN: 15.3 g/dL (ref 13.0–17.0)
Lymphocytes Relative: 23.9 % (ref 12.0–46.0)
Lymphs Abs: 1.3 10*3/uL (ref 0.7–4.0)
MCHC: 33.8 g/dL (ref 30.0–36.0)
MCV: 95.2 fl (ref 78.0–100.0)
MONO ABS: 0.4 10*3/uL (ref 0.1–1.0)
Monocytes Relative: 8.5 % (ref 3.0–12.0)
NEUTROS ABS: 3.4 10*3/uL (ref 1.4–7.7)
Neutrophils Relative %: 65.1 % (ref 43.0–77.0)
Platelets: 171 10*3/uL (ref 150.0–400.0)
RBC: 4.76 Mil/uL (ref 4.22–5.81)
RDW: 13 % (ref 11.5–15.5)
WBC: 5.3 10*3/uL (ref 4.0–10.5)

## 2014-12-08 LAB — LIPID PANEL
CHOLESTEROL: 133 mg/dL (ref 0–200)
HDL: 40.8 mg/dL (ref >39.00)
LDL Cholesterol: 68 mg/dL (ref 0–99)
NonHDL: 92.2
TRIGLYCERIDES: 120 mg/dL (ref 0.0–149.0)
Total CHOL/HDL Ratio: 3
VLDL: 24 mg/dL (ref 0.0–40.0)

## 2014-12-08 LAB — POCT URINALYSIS DIPSTICK
BILIRUBIN UA: NEGATIVE
Blood, UA: NEGATIVE
GLUCOSE UA: NEGATIVE
Ketones, UA: NEGATIVE
Leukocytes, UA: NEGATIVE
NITRITE UA: NEGATIVE
Protein, UA: NEGATIVE
SPEC GRAV UA: 1.015
Urobilinogen, UA: 0.2
pH, UA: 6.5

## 2014-12-08 LAB — HEPATIC FUNCTION PANEL
ALBUMIN: 4.7 g/dL (ref 3.5–5.2)
ALT: 20 U/L (ref 0–53)
AST: 23 U/L (ref 0–37)
Alkaline Phosphatase: 29 U/L — ABNORMAL LOW (ref 39–117)
Bilirubin, Direct: 0 mg/dL (ref 0.0–0.3)
TOTAL PROTEIN: 7.1 g/dL (ref 6.0–8.3)
Total Bilirubin: 1.1 mg/dL (ref 0.2–1.2)

## 2014-12-08 MED ORDER — METOPROLOL SUCCINATE ER 200 MG PO TB24: ORAL_TABLET | ORAL | Status: DC

## 2014-12-08 MED ORDER — SIMVASTATIN 20 MG PO TABS: 20.0000 mg | ORAL_TABLET | Freq: Every day | ORAL | Status: DC

## 2014-12-08 MED ORDER — FENOFIBRATE 160 MG PO TABS: 160.0000 mg | ORAL_TABLET | Freq: Every day | ORAL | Status: DC

## 2014-12-08 MED ORDER — DOXAZOSIN MESYLATE 4 MG PO TABS
ORAL_TABLET | ORAL | Status: DC
Start: 2014-12-08 — End: 2015-05-31

## 2014-12-08 MED ORDER — CLORAZEPATE DIPOTASSIUM 7.5 MG PO TABS: ORAL_TABLET | ORAL | Status: DC

## 2014-12-08 NOTE — Addendum Note (Signed)
Addended by: Rosalita Chessman on: 12/08/2014 12:58 PM   Modules accepted: Orders

## 2014-12-08 NOTE — Progress Notes (Signed)
Pre visit review using our clinic review tool, if applicable. No additional management support is needed unless otherwise documented below in the visit note. 

## 2014-12-08 NOTE — Progress Notes (Signed)
Subjective:    Mark Stark. is a 80 y.o. male who presents for Medicare Annual/Subsequent preventive examination.   Preventive Screening-Counseling & Management  Tobacco History  Smoking status  . Never Smoker   Smokeless tobacco  . Never Used    Problems Prior to Visit 1. none  Current Problems (verified) Patient Active Problem List   Diagnosis Date Noted  . Myalgia and myositis 11/16/2013  . Fungal ear infection 04/09/2012  . Abnormal laboratory test 03/27/2011  . AI (aortic insufficiency) 03/27/2011  . SYSTOLIC MURMUR 58/08/9832  . EDEMA 12/05/2010  . ECCHYMOSES, SPONTANEOUS 11/28/2010  . CONTUSION, RIGHT THIGH 11/28/2010  . DECREASED HEARING, BILATERAL 09/23/2010  . Pain in soft tissues of limb 07/28/2009  . ANKLE SPRAIN, LEFT 07/28/2009  . CONTUSION, LOWER LEG, LEFT 07/28/2009  . DERMATITIS, CONTACT, NOS 10/02/2007  . HYPERTENSION 05/07/2007  . PROSTATE CANCER, HX OF 03/13/2007  . HYPERLIPIDEMIA NEC/NOS 01/15/2007  . ANXIETY 01/15/2007  . DIVERTICULITIS, HX OF 01/15/2007    Medications Prior to Visit Current Outpatient Prescriptions on File Prior to Visit  Medication Sig Dispense Refill  . aspirin 81 MG tablet Take 81 mg by mouth daily.      [name] b complex vitamins tablet Take 1 tablet by mouth daily.      . Calcium Carbonate-Vitamin D (CALTRATE 600+D) 600-400 MG-UNIT per tablet Take 1 tablet by mouth daily.      . clorazepate (TRANXENE) 7.5 MG tablet TAKE 1 TABLET TWICE A DAY AS NEEDED FOR ANXIETY 60 tablet 0  . cromolyn (NASALCROM) 5.2 MG/ACT nasal spray 2 sprays by Nasal route 2 (two) times daily.      [name] doxazosin (CARDURA) 4 MG tablet TAKE 1 TABLET (4 MG TOTAL) BY MOUTH AT BEDTIME. 90 tablet 0  . fenofibrate 160 MG tablet Take 1 tablet (160 mg total) by mouth daily. Labs are due now 90 tablet 0  . fish oil-omega-3 fatty acids 1000 MG capsule Take 4 capsules by mouth daily.     . metoprolol (TOPROL-XL) 200 MG 24 hr tablet Take 1/2 tablet by mouth once  daily 90 tablet 0  . simvastatin (ZOCOR) 20 MG tablet Take 1 tablet (20 mg total) by mouth at bedtime. 90 tablet 0   No current facility-administered medications on file prior to visit.    Current Medications (verified) Current Outpatient Prescriptions  Medication Sig Dispense Refill  . aspirin 81 MG tablet Take 81 mg by mouth daily.      [name] b complex vitamins tablet Take 1 tablet by mouth daily.      . Calcium Carbonate-Vitamin D (CALTRATE 600+D) 600-400 MG-UNIT per tablet Take 1 tablet by mouth daily.      . clorazepate (TRANXENE) 7.5 MG tablet TAKE 1 TABLET TWICE A DAY AS NEEDED FOR ANXIETY 60 tablet 0  . cromolyn (NASALCROM) 5.2 MG/ACT nasal spray 2 sprays by Nasal route 2 (two) times daily.      [name] doxazosin (CARDURA) 4 MG tablet TAKE 1 TABLET (4 MG TOTAL) BY MOUTH AT BEDTIME. 90 tablet 0  . fenofibrate 160 MG tablet Take 1 tablet (160 mg total) by mouth daily. Labs are due now 90 tablet 0  . fish oil-omega-3 fatty acids 1000 MG capsule Take 4 capsules by mouth daily.     . metoprolol (TOPROL-XL) 200 MG 24 hr tablet Take 1/2 tablet by mouth once daily 90 tablet 0  . simvastatin (ZOCOR) 20 MG tablet Take 1 tablet (20 mg total) by mouth at bedtime. Fontanet  tablet 0   No current facility-administered medications for this visit.     Allergies (verified) Sulfonamide derivatives   PAST HISTORY  Family History Family History  Problem Relation Age of Onset  . Dementia Mother   . Alzheimer's disease Mother   . Mental illness Mother     ALZ DISEASE    Social History History  Substance Use Topics  . Smoking status: Never Smoker   . Smokeless tobacco: Never Used  . Alcohol Use: No    Are there smokers in your home (other than you)?  No  Risk Factors Current exercise habits: outdoors -- yard work and house work , walking  Dietary issues discussed: na   Cardiac risk factors: advanced age (older than 25 for men, 65 for women), dyslipidemia, hypertension and male  gender.  Depression Screen (Note: if answer to either of the following is "Yes", a more complete depression screening is indicated)   Q1: Over the past two weeks, have you felt down, depressed or hopeless? No  Q2: Over the past two weeks, have you felt little interest or pleasure in doing things? No  Have you lost interest or pleasure in daily life? No  Do you often feel hopeless? No  Do you cry easily over simple problems? No  Activities of Daily Living In your present state of health, do you have any difficulty performing the following activities?:  Driving? No Managing money?  No Feeding yourself? No Getting from bed to chair? No Climbing a flight of stairs? No Preparing food and eating?: No Bathing or showering? No Getting dressed: No Getting to the toilet? No Using the toilet:No Moving around from place to place: No In the past year have you fallen or had a near fall?:No   Are you sexually active?  Yes  Do you have more than one partner?  No  Hearing Difficulties: Yes----- he has hearing aids-- can hear everything with hearing aids in -- nothing without them Do you often ask people to speak up or repeat themselves? No Do you experience ringing or noises in your ears? No Do you have difficulty understanding soft or whispered voices? Yes   Do you feel that you have a problem with memory? No  Do you often misplace items? No  Do you feel safe at home?  Yes  Cognitive Testing  Alert? Yes  Normal Appearance?Yes  Oriented to person? Yes  Place? Yes   Time? Yes  Recall of three objects?  Yes  Can perform simple calculations? Yes  Displays appropriate judgment?Yes  Can read the correct time from a watch face?Yes   Advanced Directives have been discussed with the patient? Yes   List the Names of Other Physician/Practitioners you currently use: 1.  nesi-- urology 2. oph--[place],  [facility] 3. Dentist-- [place] 4,  Derm--[place]  Indicate any recent Medical Services you may  have received from other than [facility] providers in the past year (date may be approximate).  Immunization History  Administered Date(s) Administered  . Influenza Whole 11/14/2007, 09/30/2008, 09/08/2009, 08/31/2010, 09/03/2013  . Influenza-Unspecified 09/07/2014  . Pneumococcal Conjugate-13 09/07/2014  . Pneumococcal Polysaccharide-23 09/22/2009  . Zoster 03/19/2008    Screening Tests Health Maintenance  Topic Date Due  . TETANUS/TDAP  07/02/1947  . INFLUENZA VACCINE  07/26/2015  . PNEUMOCOCCAL POLYSACCHARIDE VACCINE AGE 66 AND OVER  Completed  . ZOSTAVAX  Completed    All answers were reviewed with the patient and necessary referrals were made:  Garnet Koyanagi, DO  12/08/2014   History reviewed:  He  has a past medical history of Anxiety; Diverticulitis; Hypertension; Inferior mesenteric vein thrombosis; Dyslipidemia; and Prostate cancer. He  does not have any pertinent problems on file. He  has past surgical history that includes Cataract extraction (01/27/2010); Cataract extraction (03/04/2010); Prostate seed implants; and Prostate surgery. His family history includes Alzheimer's disease in his mother; Cancer (age of onset: 41) in his brother; Dementia in his mother; Mental illness in his mother. He  reports that he has never smoked. He has never used smokeless tobacco. He reports that he does not drink alcohol or use illicit drugs. He has a current medication list which includes the following prescription(s): aspirin, b complex vitamins, calcium carbonate-vitamin d, clorazepate, cromolyn, doxazosin, fenofibrate, fish oil-omega-3 fatty acids, metoprolol, and simvastatin. Current Outpatient Prescriptions on File Prior to Visit  Medication Sig Dispense Refill  . aspirin 81 MG tablet Take 81 mg by mouth daily.      [name] b complex vitamins tablet Take 1 tablet by mouth daily.      . Calcium Carbonate-Vitamin D (CALTRATE 600+D) 600-400 MG-UNIT per tablet Take 1 tablet by mouth daily.       . clorazepate (TRANXENE) 7.5 MG tablet TAKE 1 TABLET TWICE A DAY AS NEEDED FOR ANXIETY 60 tablet 0  . cromolyn (NASALCROM) 5.2 MG/ACT nasal spray 2 sprays by Nasal route 2 (two) times daily.      [name] doxazosin (CARDURA) 4 MG tablet TAKE 1 TABLET (4 MG TOTAL) BY MOUTH AT BEDTIME. 90 tablet 0  . fenofibrate 160 MG tablet Take 1 tablet (160 mg total) by mouth daily. Labs are due now 90 tablet 0  . fish oil-omega-3 fatty acids 1000 MG capsule Take 4 capsules by mouth daily.     . metoprolol (TOPROL-XL) 200 MG 24 hr tablet Take 1/2 tablet by mouth once daily 90 tablet 0  . simvastatin (ZOCOR) 20 MG tablet Take 1 tablet (20 mg total) by mouth at bedtime. 90 tablet 0   No current facility-administered medications on file prior to visit.   He is allergic to sulfonamide derivatives.  Review of Systems  Review of Systems  Constitutional: Negative for activity change, appetite change and fatigue.  HENT: Negative for hearing loss, congestion, tinnitus and ear discharge.   Eyes: Negative for visual disturbance (see optho q1y -- vision corrected to 20/20 with glasses).  Respiratory: Negative for cough, chest tightness and shortness of breath.   Cardiovascular: Negative for chest pain, palpitations and leg swelling.  Gastrointestinal: Negative for abdominal pain, diarrhea, constipation and abdominal distention.  Genitourinary: Negative for urgency, frequency, decreased urine volume and difficulty urinating.  Musculoskeletal: Negative for back pain, arthralgias and gait problem.  Skin: Negative for color change, pallor and rash.  Neurological: Negative for dizziness, light-headedness, numbness and headaches.  Hematological: Negative for adenopathy. Does not bruise/bleed easily.  Psychiatric/Behavioral: Negative for suicidal ideas, confusion, sleep disturbance, self-injury, dysphoric mood, decreased concentration and agitation.  Pt is able to read and write and can do all ADLs No risk for falling No  abuse/ violence in home    Objective:     Vision by Snellen chart: opth Blood pressure 154/80, pulse 77, temperature 97.8 F (36.6 C), temperature source Oral, height 5\' 8"  (1.727 m), weight 182 lb 6.4 oz (82.736 kg), SpO2 97 %. Body mass index is 27.74 kg/(m^2).  BP 154/80 mmHg  Pulse 77  Temp(Src) 97.8 F (36.6 C) (Oral)  Ht 5\' 8"  (1.727 m)  Wt 182 lb  6.4 oz (82.736 kg)  BMI 27.74 kg/m2  SpO2 97% General appearance: alert, cooperative, appears stated age and no distress Head: Normocephalic, without obvious abnormality, atraumatic Eyes: negative findings: lids and lashes normal, conjunctivae and sclerae normal and pupils equal, round, reactive to light and accomodation Ears: normal TM's and external ear canals both ears Nose: Nares normal. Septum midline. Mucosa normal. No drainage or sinus tenderness. Throat: lips, mucosa, and tongue normal; teeth and gums normal Neck: no adenopathy, no carotid bruit, no JVD, supple, symmetrical, trachea midline and thyroid not enlarged, symmetric, no tenderness/mass/nodules Back: symmetric, no curvature. ROM normal. No CVA tenderness. Lungs: clear to auscultation bilaterally Chest wall: no tenderness Heart: S1, S2 normal Abdomen: soft, non-tender; bowel sounds normal; no masses,  no organomegaly Male genitalia: deferred-urology Rectal: deferred - urology -Extremities: extremities normal, atraumatic, no cyanosis or edema Pulses: 2+ and symmetric Skin: Skin color, texture, turgor normal. No rashes or lesions Lymph nodes: Cervical, supraclavicular, and axillary nodes normal. Neurologic: Alert and oriented X 3, normal strength and tone. Normal symmetric reflexes. Normal coordination and gait Psych- no depression, normal      Assessment:     cpe      Plan:     During the course of the visit the patient was educated and counseled about appropriate screening and preventive services including:    Pneumococcal vaccine   Influenza  vaccine  Prostate cancer screening  Colorectal cancer screening  Diabetes screening  Glaucoma screening  Advanced directives: has an advanced directive - a copy HAS NOT been provided.  Diet review for nutrition referral? Yes ____  Not Indicated __x__   Patient Instructions (the written plan) was given to the patient.  Medicare Attestation I have personally reviewed: The patient's medical and social history Their use of alcohol, tobacco or illicit drugs Their current medications and supplements The patient's functional ability including ADLs,fall risks, home safety risks, cognitive, and hearing and visual impairment Diet and physical activities Evidence for depression or mood disorders  The patient's weight, height, BMI, and visual acuity have been recorded in the chart.  I have made referrals, counseling, and provided education to the patient based on review of the above and I have provided the patient with a written personalized care plan for preventive services.   1. Hyperlipidemia Check  - Basic metabolic panel - CBC with Differential - Hepatic function panel - Lipid panel - POCT urinalysis dipstick - simvastatin (ZOCOR) 20 MG tablet; Take 1 tablet (20 mg total) by mouth at bedtime.  Dispense: 90 tablet; Refill: 1 - fenofibrate 160 MG tablet; Take 1 tablet (160 mg total) by mouth daily. Labs are due now  Dispense: 90 tablet; Refill: 0  2. Essential hypertension Stable-- slightly high here-- running better at home  - Basic metabolic panel - CBC with Differential - Hepatic function panel - Lipid panel - POCT urinalysis dipstick - metoprolol (TOPROL-XL) 200 MG 24 hr tablet; Take 1/2 tablet by mouth once daily  Dispense: 90 tablet; Refill: 1 - doxazosin (CARDURA) 4 MG tablet; TAKE 1 TABLET (4 MG TOTAL) BY MOUTH AT BEDTIME.  Dispense: 90 tablet; Refill: 1  3. Generalized anxiety disorder Refill med - clorazepate (TRANXENE) 7.5 MG tablet; TAKE 1 TABLET TWICE A DAY AS  NEEDED FOR ANXIETY  Dispense: 60 tablet; Refill: 2  4. Preventative health care      Garnet Koyanagi, DO   12/08/2014

## 2014-12-08 NOTE — Patient Instructions (Signed)
Preventive Care for Adults A healthy lifestyle and preventive care can promote health and wellness. Preventive health guidelines for men include the following key practices:  A routine yearly physical is a good way to check with your health care provider about your health and preventative screening. It is a chance to share any concerns and updates on your health and to receive a thorough exam.  Visit your dentist for a routine exam and preventative care every 6 months. Brush your teeth twice a day and floss once a day. Good oral hygiene prevents tooth decay and gum disease.  The frequency of eye exams is based on your age, health, family medical history, use of contact lenses, and other factors. Follow your health care provider's recommendations for frequency of eye exams.  Eat a healthy diet. Foods such as vegetables, fruits, whole grains, low-fat dairy products, and lean protein foods contain the nutrients you need without too many calories. Decrease your intake of foods high in solid fats, added sugars, and salt. Eat the right amount of calories for you.Get information about a proper diet from your health care provider, if necessary.  Regular physical exercise is one of the most important things you can do for your health. Most adults should get at least 150 minutes of moderate-intensity exercise (any activity that increases your heart rate and causes you to sweat) each week. In addition, most adults need muscle-strengthening exercises on 2 or more days a week.  Maintain a healthy weight. The body mass index (BMI) is a screening tool to identify possible weight problems. It provides an estimate of body fat based on height and weight. Your health care provider can find your BMI and can help you achieve or maintain a healthy weight.For adults 20 years and older:  A BMI below 18.5 is considered underweight.  A BMI of 18.5 to 24.9 is normal.  A BMI of 25 to 29.9 is considered overweight.  A BMI  of 30 and above is considered obese.  Maintain normal blood lipids and cholesterol levels by exercising and minimizing your intake of saturated fat. Eat a balanced diet with plenty of fruit and vegetables. Blood tests for lipids and cholesterol should begin at age 50 and be repeated every 5 years. If your lipid or cholesterol levels are high, you are over 50, or you are at high risk for heart disease, you may need your cholesterol levels checked more frequently.Ongoing high lipid and cholesterol levels should be treated with medicines if diet and exercise are not working.  If you smoke, find out from your health care provider how to quit. If you do not use tobacco, do not start.  Lung cancer screening is recommended for adults aged 73-80 years who are at high risk for developing lung cancer because of a history of smoking. A yearly low-dose CT scan of the lungs is recommended for people who have at least a 30-pack-year history of smoking and are a current smoker or have quit within the past 15 years. A pack year of smoking is smoking an average of 1 pack of cigarettes a day for 1 year (for example: 1 pack a day for 30 years or 2 packs a day for 15 years). Yearly screening should continue until the smoker has stopped smoking for at least 15 years. Yearly screening should be stopped for people who develop a health problem that would prevent them from having lung cancer treatment.  If you choose to drink alcohol, do not have more than  2 drinks per day. One drink is considered to be 12 ounces (355 mL) of beer, 5 ounces (148 mL) of wine, or 1.5 ounces (44 mL) of liquor.  Avoid use of street drugs. Do not share needles with anyone. Ask for help if you need support or instructions about stopping the use of drugs.  High blood pressure causes heart disease and increases the risk of stroke. Your blood pressure should be checked at least every 1-2 years. Ongoing high blood pressure should be treated with  medicines, if weight loss and exercise are not effective.  If you are 45-79 years old, ask your health care provider if you should take aspirin to prevent heart disease.  Diabetes screening involves taking a blood sample to check your fasting blood sugar level. This should be done once every 3 years, after age 45, if you are within normal weight and without risk factors for diabetes. Testing should be considered at a younger age or be carried out more frequently if you are overweight and have at least 1 risk factor for diabetes.  Colorectal cancer can be detected and often prevented. Most routine colorectal cancer screening begins at the age of 50 and continues through age 75. However, your health care provider may recommend screening at an earlier age if you have risk factors for colon cancer. On a yearly basis, your health care provider may provide home test kits to check for hidden blood in the stool. Use of a small camera at the end of a tube to directly examine the colon (sigmoidoscopy or colonoscopy) can detect the earliest forms of colorectal cancer. Talk to your health care provider about this at age 50, when routine screening begins. Direct exam of the colon should be repeated every 5-10 years through age 75, unless early forms of precancerous polyps or small growths are found.  People who are at an increased risk for hepatitis B should be screened for this virus. You are considered at high risk for hepatitis B if:  You were born in a country where hepatitis B occurs often. Talk with your health care provider about which countries are considered high risk.  Your parents were born in a high-risk country and you have not received a shot to protect against hepatitis B (hepatitis B vaccine).  You have HIV or AIDS.  You use needles to inject street drugs.  You live with, or have sex with, someone who has hepatitis B.  You are a man who has sex with other men (MSM).  You get hemodialysis  treatment.  You take certain medicines for conditions such as cancer, organ transplantation, and autoimmune conditions.  Hepatitis C blood testing is recommended for all people born from 1945 through 1965 and any individual with known risks for hepatitis C.  Practice safe sex. Use condoms and avoid high-risk sexual practices to reduce the spread of sexually transmitted infections (STIs). STIs include gonorrhea, chlamydia, syphilis, trichomonas, herpes, HPV, and human immunodeficiency virus (HIV). Herpes, HIV, and HPV are viral illnesses that have no cure. They can result in disability, cancer, and death.  If you are at risk of being infected with HIV, it is recommended that you take a prescription medicine daily to prevent HIV infection. This is called preexposure prophylaxis (PrEP). You are considered at risk if:  You are a man who has sex with other men (MSM) and have other risk factors.  You are a heterosexual man, are sexually active, and are at increased risk for HIV infection.    You take drugs by injection.  You are sexually active with a partner who has HIV.  Talk with your health care provider about whether you are at high risk of being infected with HIV. If you choose to begin PrEP, you should first be tested for HIV. You should then be tested every 3 months for as long as you are taking PrEP.  A one-time screening for abdominal aortic aneurysm (AAA) and surgical repair of large AAAs by ultrasound are recommended for men ages 32 to 67 years who are current or former smokers.  Healthy men should no longer receive prostate-specific antigen (PSA) blood tests as part of routine cancer screening. Talk with your health care provider about prostate cancer screening.  Testicular cancer screening is not recommended for adult males who have no symptoms. Screening includes self-exam, a health care provider exam, and other screening tests. Consult with your health care provider about any symptoms  you have or any concerns you have about testicular cancer.  Use sunscreen. Apply sunscreen liberally and repeatedly throughout the day. You should seek shade when your shadow is shorter than you. Protect yourself by wearing long sleeves, pants, a wide-brimmed hat, and sunglasses year round, whenever you are outdoors.  Once a month, do a whole-body skin exam, using a mirror to look at the skin on your back. Tell your health care provider about new moles, moles that have irregular borders, moles that are larger than a pencil eraser, or moles that have changed in shape or color.  Stay current with required vaccines (immunizations).  Influenza vaccine. All adults should be immunized every year.  Tetanus, diphtheria, and acellular pertussis (Td, Tdap) vaccine. An adult who has not previously received Tdap or who does not know his vaccine status should receive 1 dose of Tdap. This initial dose should be followed by tetanus and diphtheria toxoids (Td) booster doses every 10 years. Adults with an unknown or incomplete history of completing a 3-dose immunization series with Td-containing vaccines should begin or complete a primary immunization series including a Tdap dose. Adults should receive a Td booster every 10 years.  Varicella vaccine. An adult without evidence of immunity to varicella should receive 2 doses or a second dose if he has previously received 1 dose.  Human papillomavirus (HPV) vaccine. Males aged 68-21 years who have not received the vaccine previously should receive the 3-dose series. Males aged 22-26 years may be immunized. Immunization is recommended through the age of 6 years for any male who has sex with males and did not get any or all doses earlier. Immunization is recommended for any person with an immunocompromised condition through the age of 49 years if he did not get any or all doses earlier. During the 3-dose series, the second dose should be obtained 4-8 weeks after the first  dose. The third dose should be obtained 24 weeks after the first dose and 16 weeks after the second dose.  Zoster vaccine. One dose is recommended for adults aged 50 years or older unless certain conditions are present.  Measles, mumps, and rubella (MMR) vaccine. Adults born before [birth year] generally are considered immune to measles and mumps. Adults born in [birth year] or later should have 1 or more doses of MMR vaccine unless there is a contraindication to the vaccine or there is laboratory evidence of immunity to each of the three diseases. A routine second dose of MMR vaccine should be obtained at least 28 days after the first dose for students attending postsecondary  schools, health care workers, or international travelers. People who received inactivated measles vaccine or an unknown type of measles vaccine during 1963-1967 should receive 2 doses of MMR vaccine. People who received inactivated mumps vaccine or an unknown type of mumps vaccine before 1979 and are at high risk for mumps infection should consider immunization with 2 doses of MMR vaccine. Unvaccinated health care workers born before 1957 who lack laboratory evidence of measles, mumps, or rubella immunity or laboratory confirmation of disease should consider measles and mumps immunization with 2 doses of MMR vaccine or rubella immunization with 1 dose of MMR vaccine.  Pneumococcal 13-valent conjugate (PCV13) vaccine. When indicated, a person who is uncertain of his immunization history and has no record of immunization should receive the PCV13 vaccine. An adult aged 19 years or older who has certain medical conditions and has not been previously immunized should receive 1 dose of PCV13 vaccine. This PCV13 should be followed with a dose of pneumococcal polysaccharide (PPSV23) vaccine. The PPSV23 vaccine dose should be obtained at least 8 weeks after the dose of PCV13 vaccine. An adult aged 19 years or older who has certain medical conditions and  previously received 1 or more doses of PPSV23 vaccine should receive 1 dose of PCV13. The PCV13 vaccine dose should be obtained 1 or more years after the last PPSV23 vaccine dose.  Pneumococcal polysaccharide (PPSV23) vaccine. When PCV13 is also indicated, PCV13 should be obtained first. All adults aged 65 years and older should be immunized. An adult younger than age 65 years who has certain medical conditions should be immunized. Any person who resides in a nursing home or long-term care facility should be immunized. An adult smoker should be immunized. People with an immunocompromised condition and certain other conditions should receive both PCV13 and PPSV23 vaccines. People with human immunodeficiency virus (HIV) infection should be immunized as soon as possible after diagnosis. Immunization during chemotherapy or radiation therapy should be avoided. Routine use of PPSV23 vaccine is not recommended for American Indians, [place] Natives, or people younger than 65 years unless there are medical conditions that require PPSV23 vaccine. When indicated, people who have unknown immunization and have no record of immunization should receive PPSV23 vaccine. One-time revaccination 5 years after the first dose of PPSV23 is recommended for people aged 19-64 years who have chronic kidney failure, nephrotic syndrome, asplenia, or immunocompromised conditions. People who received 1-2 doses of PPSV23 before age 65 years should receive another dose of PPSV23 vaccine at age 65 years or later if at least 5 years have passed since the previous dose. Doses of PPSV23 are not needed for people immunized with PPSV23 at or after age 65 years.  Meningococcal vaccine. Adults with asplenia or persistent complement component deficiencies should receive 2 doses of quadrivalent meningococcal conjugate (MenACWY-D) vaccine. The doses should be obtained at least 2 months apart. Microbiologists working with certain meningococcal bacteria,  military recruits, people at risk during an outbreak, and people who travel to or live in countries with a high rate of meningitis should be immunized. A first-year college student up through age 21 years who is living in a residence hall should receive a dose if he did not receive a dose on or after his 16th birthday. Adults who have certain high-risk conditions should receive one or more doses of vaccine.  Hepatitis A vaccine. Adults who wish to be protected from this disease, have certain high-risk conditions, work with hepatitis A-infected animals, work in hepatitis A research labs, or   travel to or work in countries with a high rate of hepatitis A should be immunized. Adults who were previously unvaccinated and who anticipate close contact with an international adoptee during the first 60 days after arrival in the [place] from a country with a high rate of hepatitis A should be immunized.  Hepatitis B vaccine. Adults should be immunized if they wish to be protected from this disease, have certain high-risk conditions, may be exposed to blood or other infectious body fluids, are household contacts or sex partners of hepatitis B positive people, are clients or workers in certain care facilities, or travel to or work in countries with a high rate of hepatitis B.  Haemophilus influenzae type b (Hib) vaccine. A previously unvaccinated person with asplenia or sickle cell disease or having a scheduled splenectomy should receive 1 dose of Hib vaccine. Regardless of previous immunization, a recipient of a hematopoietic stem cell transplant should receive a 3-dose series 6-12 months after his successful transplant. Hib vaccine is not recommended for adults with HIV infection. Preventive Service / Frequency Ages 52 to 17  Blood pressure check.** / Every 1 to 2 years.  Lipid and cholesterol check.** / Every 5 years beginning at age 69.  Hepatitis C blood test.** / For any individual with known risks for  hepatitis C.  Skin self-exam. / Monthly.  Influenza vaccine. / Every year.  Tetanus, diphtheria, and acellular pertussis (Tdap, Td) vaccine.** / Consult your health care provider. 1 dose of Td every 10 years.  Varicella vaccine.** / Consult your health care provider.  HPV vaccine. / 3 doses over 6 months, if 72 or younger.  Measles, mumps, rubella (MMR) vaccine.** / You need at least 1 dose of MMR if you were born in 1957 or later. You may also need a second dose.  Pneumococcal 13-valent conjugate (PCV13) vaccine.** / Consult your health care provider.  Pneumococcal polysaccharide (PPSV23) vaccine.** / 1 to 2 doses if you smoke cigarettes or if you have certain conditions.  Meningococcal vaccine.** / 1 dose if you are age 35 to 60 years and a Market researcher living in a residence hall, or have one of several medical conditions. You may also need additional booster doses.  Hepatitis A vaccine.** / Consult your health care provider.  Hepatitis B vaccine.** / Consult your health care provider.  Haemophilus influenzae type b (Hib) vaccine.** / Consult your health care provider. Ages 35 to 8  Blood pressure check.** / Every 1 to 2 years.  Lipid and cholesterol check.** / Every 5 years beginning at age 57.  Lung cancer screening. / Every year if you are aged 44-80 years and have a 30-pack-year history of smoking and currently smoke or have quit within the past 15 years. Yearly screening is stopped once you have quit smoking for at least 15 years or develop a health problem that would prevent you from having lung cancer treatment.  Fecal occult blood test (FOBT) of stool. / Every year beginning at age 55 and continuing until age 73. You may not have to do this test if you get a colonoscopy every 10 years.  Flexible sigmoidoscopy** or colonoscopy.** / Every 5 years for a flexible sigmoidoscopy or every 10 years for a colonoscopy beginning at age 28 and continuing until age  1.  Hepatitis C blood test.** / For all people born from [birth year] through 1965 and any individual with known risks for hepatitis C.  Skin self-exam. / Monthly.  Influenza vaccine. / Every  year.  Tetanus, diphtheria, and acellular pertussis (Tdap/Td) vaccine.** / Consult your health care provider. 1 dose of Td every 10 years.  Varicella vaccine.** / Consult your health care provider.  Zoster vaccine.** / 1 dose for adults aged 53 years or older.  Measles, mumps, rubella (MMR) vaccine.** / You need at least 1 dose of MMR if you were born in 1957 or later. You may also need a second dose.  Pneumococcal 13-valent conjugate (PCV13) vaccine.** / Consult your health care provider.  Pneumococcal polysaccharide (PPSV23) vaccine.** / 1 to 2 doses if you smoke cigarettes or if you have certain conditions.  Meningococcal vaccine.** / Consult your health care provider.  Hepatitis A vaccine.** / Consult your health care provider.  Hepatitis B vaccine.** / Consult your health care provider.  Haemophilus influenzae type b (Hib) vaccine.** / Consult your health care provider. Ages 77 and over  Blood pressure check.** / Every 1 to 2 years.  Lipid and cholesterol check.**/ Every 5 years beginning at age 85.  Lung cancer screening. / Every year if you are aged 55-80 years and have a 30-pack-year history of smoking and currently smoke or have quit within the past 15 years. Yearly screening is stopped once you have quit smoking for at least 15 years or develop a health problem that would prevent you from having lung cancer treatment.  Fecal occult blood test (FOBT) of stool. / Every year beginning at age 33 and continuing until age 11. You may not have to do this test if you get a colonoscopy every 10 years.  Flexible sigmoidoscopy** or colonoscopy.** / Every 5 years for a flexible sigmoidoscopy or every 10 years for a colonoscopy beginning at age 28 and continuing until age 73.  Hepatitis C blood  test.** / For all people born from [birth year] through 1963 and any individual with known risks for hepatitis C.  Abdominal aortic aneurysm (AAA) screening.** / A one-time screening for ages 50 to 27 years who are current or former smokers.  Skin self-exam. / Monthly.  Influenza vaccine. / Every year.  Tetanus, diphtheria, and acellular pertussis (Tdap/Td) vaccine.** / 1 dose of Td every 10 years.  Varicella vaccine.** / Consult your health care provider.  Zoster vaccine.** / 1 dose for adults aged 34 years or older.  Pneumococcal 13-valent conjugate (PCV13) vaccine.** / Consult your health care provider.  Pneumococcal polysaccharide (PPSV23) vaccine.** / 1 dose for all adults aged 63 years and older.  Meningococcal vaccine.** / Consult your health care provider.  Hepatitis A vaccine.** / Consult your health care provider.  Hepatitis B vaccine.** / Consult your health care provider.  Haemophilus influenzae type b (Hib) vaccine.** / Consult your health care provider. **Family history and personal history of risk and conditions may change your health care provider's recommendations. Document Released: 02/06/2002 Document Revised: 12/16/2013 Document Reviewed: 05/08/2011 [facility] Patient Information 2015 [place], [place]. This information is not intended to replace advice given to you by your health care provider. Make sure you discuss any questions you have with your health care provider.

## 2014-12-14 ENCOUNTER — Telehealth: Payer: Self-pay

## 2014-12-14 ENCOUNTER — Other Ambulatory Visit (INDEPENDENT_AMBULATORY_CARE_PROVIDER_SITE_OTHER): Payer: Medicare Other

## 2014-12-14 DIAGNOSIS — Z0000 Encounter for general adult medical examination without abnormal findings: Secondary | ICD-10-CM | POA: Diagnosis not present

## 2014-12-14 DIAGNOSIS — E785 Hyperlipidemia, unspecified: Secondary | ICD-10-CM

## 2014-12-14 DIAGNOSIS — I10 Essential (primary) hypertension: Secondary | ICD-10-CM

## 2014-12-14 LAB — FECAL OCCULT BLOOD, IMMUNOCHEMICAL: FECAL OCCULT BLD: NEGATIVE

## 2014-12-14 NOTE — Telephone Encounter (Signed)
6 mth follow up labs ordered.

## 2014-12-23 ENCOUNTER — Encounter: Payer: Self-pay | Admitting: Family Medicine

## 2015-03-03 ENCOUNTER — Other Ambulatory Visit: Payer: Self-pay | Admitting: Family Medicine

## 2015-03-04 NOTE — Telephone Encounter (Signed)
Last filled:  12/08/14 Amt: 90, 0 refills Last OV:  121/12/15  Med filled x 1

## 2015-03-24 DIAGNOSIS — C61 Malignant neoplasm of prostate: Secondary | ICD-10-CM | POA: Diagnosis not present

## 2015-04-01 DIAGNOSIS — C61 Malignant neoplasm of prostate: Secondary | ICD-10-CM | POA: Diagnosis not present

## 2015-04-02 ENCOUNTER — Other Ambulatory Visit: Payer: Self-pay | Admitting: Family Medicine

## 2015-05-31 ENCOUNTER — Other Ambulatory Visit: Payer: Self-pay | Admitting: Family Medicine

## 2015-06-10 ENCOUNTER — Ambulatory Visit: Payer: Medicare Other | Admitting: Family Medicine

## 2015-06-10 DIAGNOSIS — H3531 Nonexudative age-related macular degeneration: Secondary | ICD-10-CM | POA: Diagnosis not present

## 2015-06-10 DIAGNOSIS — H35373 Puckering of macula, bilateral: Secondary | ICD-10-CM | POA: Diagnosis not present

## 2015-06-10 DIAGNOSIS — H43813 Vitreous degeneration, bilateral: Secondary | ICD-10-CM | POA: Diagnosis not present

## 2015-06-10 DIAGNOSIS — Z961 Presence of intraocular lens: Secondary | ICD-10-CM | POA: Diagnosis not present

## 2015-06-14 DIAGNOSIS — D1801 Hemangioma of skin and subcutaneous tissue: Secondary | ICD-10-CM | POA: Diagnosis not present

## 2015-06-14 DIAGNOSIS — D225 Melanocytic nevi of trunk: Secondary | ICD-10-CM | POA: Diagnosis not present

## 2015-06-14 DIAGNOSIS — L814 Other melanin hyperpigmentation: Secondary | ICD-10-CM | POA: Diagnosis not present

## 2015-06-14 DIAGNOSIS — L821 Other seborrheic keratosis: Secondary | ICD-10-CM | POA: Diagnosis not present

## 2015-06-25 ENCOUNTER — Ambulatory Visit (INDEPENDENT_AMBULATORY_CARE_PROVIDER_SITE_OTHER): Payer: Medicare Other | Admitting: Family Medicine

## 2015-06-25 ENCOUNTER — Ambulatory Visit (HOSPITAL_BASED_OUTPATIENT_CLINIC_OR_DEPARTMENT_OTHER)
Admission: RE | Admit: 2015-06-25 | Discharge: 2015-06-25 | Disposition: A | Discharge status: Home / Self Care | Payer: Medicare Other | Source: Ambulatory Visit | Attending: Family Medicine | Admitting: Family Medicine

## 2015-06-25 ENCOUNTER — Encounter: Payer: Self-pay | Admitting: Family Medicine

## 2015-06-25 VITALS — BP 138/74 | HR 93 | Temp 98.3°F | Ht 68.0 in | Wt 176.2 lb

## 2015-06-25 DIAGNOSIS — J189 Pneumonia, unspecified organism: Secondary | ICD-10-CM | POA: Diagnosis not present

## 2015-06-25 DIAGNOSIS — R059 Cough, unspecified: Secondary | ICD-10-CM

## 2015-06-25 DIAGNOSIS — R05 Cough: Secondary | ICD-10-CM

## 2015-06-25 DIAGNOSIS — J9811 Atelectasis: Secondary | ICD-10-CM | POA: Diagnosis not present

## 2015-06-25 MED ORDER — AMOXICILLIN-POT CLAVULANATE 875-125 MG PO TABS: 1.0000 | ORAL_TABLET | Freq: Two times a day (BID) | ORAL | Status: DC

## 2015-06-25 MED ORDER — BENZONATATE 100 MG PO CAPS: 100.0000 mg | ORAL_CAPSULE | Freq: Three times a day (TID) | ORAL | Status: DC | PRN

## 2015-06-25 NOTE — Patient Instructions (Signed)

## 2015-06-25 NOTE — Progress Notes (Signed)
Patient ID: Mark Stark., male    DOB: 04-18-1928  Age: 81 y.o. MRN: [id number]    Subjective:  Subjective HPI Mark Stark. presents for cough and congestion x 9 days.    Review of Systems  Constitutional: Positive for fever and chills.  HENT: Positive for congestion. Negative for postnasal drip, rhinorrhea and sinus pressure.   Respiratory: Positive for cough, chest tightness, shortness of breath and wheezing.   Cardiovascular: Negative for chest pain, palpitations and leg swelling.  Allergic/Immunologic: Negative for environmental allergies.    History Past Medical History  Diagnosis Date  . Anxiety   . Diverticulitis   . Hypertension   . Inferior mesenteric vein thrombosis   . Dyslipidemia   . Prostate cancer     He has past surgical history that includes Cataract extraction (01/27/2010); Cataract extraction (03/04/2010); Prostate seed implants; and Prostate surgery.   His family history includes Alzheimer's disease in his mother; Cancer (age of onset: 20) in his brother; Dementia in his mother; Mental illness in his mother.He reports that he has never smoked. He has never used smokeless tobacco. He reports that he does not drink alcohol or use illicit drugs.  Current Outpatient Prescriptions on File Prior to Visit  Medication Sig Dispense Refill  . aspirin 81 MG tablet Take 81 mg by mouth daily.      [name] b complex vitamins tablet Take 1 tablet by mouth daily.      . Calcium Carbonate-Vitamin D (CALTRATE 600+D) 600-400 MG-UNIT per tablet Take 1 tablet by mouth daily.      . clorazepate (TRANXENE) 7.5 MG tablet TAKE 1 TABLET TWICE A DAY AS NEEDED FOR ANXIETY 60 tablet 2  . cromolyn (NASALCROM) 5.2 MG/ACT nasal spray 2 sprays by Nasal route 2 (two) times daily.      [name] doxazosin (CARDURA) 4 MG tablet TAKE 1 TABLET BY MOUTH AT BEDTIME 90 tablet 1  . fenofibrate 160 MG tablet Take 1 tablet (160 mg total) by mouth daily. Repeat labs are due now 30 tablet 1  . fish oil-omega-3  fatty acids 1000 MG capsule Take 4 capsules by mouth daily.     . metoprolol (TOPROL-XL) 200 MG 24 hr tablet Take 1/2 tablet by mouth once daily 90 tablet 1  . simvastatin (ZOCOR) 20 MG tablet Take 1 tablet (20 mg total) by mouth at bedtime. 90 tablet 1   No current facility-administered medications on file prior to visit.     Objective:  Objective Physical Exam  Constitutional: He is oriented to person, place, and time. He appears well-developed and well-nourished.  HENT:  Right Ear: External ear normal.  Left Ear: External ear normal.  Eyes: Conjunctivae are normal. Right eye exhibits no discharge. Left eye exhibits no discharge.  Cardiovascular: Normal rate, regular rhythm and normal heart sounds.   No murmur heard. Pulmonary/Chest: Effort normal. No respiratory distress. He has no wheezes. He has rales. He exhibits no tenderness.  Musculoskeletal: He exhibits no edema.  Lymphadenopathy:    He has no cervical adenopathy.  Neurological: He is alert and oriented to person, place, and time.  Psychiatric: He has a normal mood and affect. His behavior is normal. Judgment and thought content normal.   BP 138/74 mmHg  Pulse 93  Temp(Src) 98.3 F (36.8 C) (Oral)  Ht 5\' 8"  (1.727 m)  Wt 176 lb 3.2 oz (79.924 kg)  BMI 26.80 kg/m2  SpO2 97% Wt Readings from Last 3 Encounters:  06/25/15 176 lb 3.2  oz (79.924 kg)  12/08/14 182 lb 6.4 oz (82.736 kg)  11/14/13 182 lb (82.555 kg)     Lab Results  Component Value Date   WBC 5.3 12/08/2014   HGB 15.3 12/08/2014   HCT 45.3 12/08/2014   PLT 171.0 12/08/2014   GLUCOSE 97 12/08/2014   CHOL 133 12/08/2014   TRIG 120.0 12/08/2014   HDL 40.80 12/08/2014   LDLCALC 68 12/08/2014   ALT 20 12/08/2014   AST 23 12/08/2014   NA 136 12/08/2014   K 4.3 12/08/2014   CL 104 12/08/2014   CREATININE 1.1 12/08/2014   BUN 21 12/08/2014   CO2 27 12/08/2014   PSA 0.35 12/27/2009   INR 1.00 11/28/2010   HGBA1C 5.0 05/21/2012    Dg Lumbar Spine  Complete  01/21/2013   *RADIOLOGY REPORT*  Clinical Data: Low back and left hip pain.  Prostate cancer.  LUMBAR SPINE - COMPLETE 4+ VIEW  Comparison: CT scan dated 02/14/2006 and bone scan dated 01/17/2007  Findings: There is no fracture, bone destruction, or other acute abnormality.  The patient has degenerative narrowing of the L1-2 and L2-3 disc spaces.  Severe bilateral facet arthritis at L5-S1.  Small benign bone island in the superior aspect of the left ilium, unchanged since 02/14/2006.  IMPRESSION: Severe bilateral facet arthritis at L5-S1.  Slight degenerative narrowing of the discs at L1-2 and L2-3.   Original Report Authenticated By: Lorriane Shire, M.D.   Dg Hip Complete Left  01/21/2013   *RADIOLOGY REPORT*  Clinical Data: History of chronic low back pain.  History of prostate carcinoma.  LEFT HIP - COMPLETE 2+ VIEW  Comparison: CT 02/14/2006.  Findings: Brachytherapy seeds are seen in the prostatic region. Bony pelvis appears intact.  No definite blastic or lytic lesions are seen.  No fracture is evident.  There is slight narrowing of the hip joint spaces.  This is best seen on the previous CT. No calcific bursitis is evident.  IMPRESSION: Prostate brachytherapy seeds are in place.  No definite blastic or lytic lesions seen.  No fracture evident.  Slight narrowing of hip joint spaces.   Original Report Authenticated By: Shanon Brow Call     Assessment & Plan:  Plan I am having Mark Stark start on amoxicillin-clavulanate and benzonatate. I am also having him maintain his aspirin, b complex vitamins, Calcium Carbonate-Vitamin D, fish oil-omega-3 fatty acids, cromolyn, simvastatin, metoprolol, clorazepate, fenofibrate, doxazosin, and Multiple Vitamins-Minerals (PRESERVISION AREDS 2 PO).  Meds ordered this encounter  Medications  . Multiple Vitamins-Minerals (PRESERVISION AREDS 2 PO)    Sig: Take 1 capsule by mouth daily.  [name] amoxicillin-clavulanate (AUGMENTIN) 875-125 MG per tablet    Sig: Take 1  tablet by mouth 2 (two) times daily.    Dispense:  20 tablet    Refill:  0  . benzonatate (TESSALON) 100 MG capsule    Sig: Take 1 capsule (100 mg total) by mouth 3 (three) times daily as needed for cough.    Dispense:  30 capsule    Refill:  0    Problem List Items Addressed This Visit    None    Visit Diagnoses    CAP (community acquired pneumonia)    -  Primary    Relevant Medications    amoxicillin-clavulanate (AUGMENTIN) 875-125 MG per tablet    benzonatate (TESSALON) 100 MG capsule    Cough        Relevant Medications    benzonatate (TESSALON) 100 MG capsule    Other Relevant  Orders    DG Chest 2 View (Completed)       Follow-up: Return if symptoms worsen or fail to improve.  Garnet Koyanagi, DO

## 2015-06-25 NOTE — Progress Notes (Signed)
Pre visit review using our clinic review tool, if applicable. No additional management support is needed unless otherwise documented below in the visit note. 

## 2015-06-30 ENCOUNTER — Other Ambulatory Visit: Payer: Self-pay

## 2015-06-30 DIAGNOSIS — F411 Generalized anxiety disorder: Secondary | ICD-10-CM

## 2015-06-30 MED ORDER — CLORAZEPATE DIPOTASSIUM 7.5 MG PO TABS: ORAL_TABLET | ORAL | Status: DC

## 2015-07-02 ENCOUNTER — Encounter: Payer: Self-pay | Admitting: Family Medicine

## 2015-07-02 ENCOUNTER — Ambulatory Visit (INDEPENDENT_AMBULATORY_CARE_PROVIDER_SITE_OTHER): Payer: Medicare Other | Admitting: Family Medicine

## 2015-07-02 VITALS — BP 112/70 | HR 76 | Temp 98.6°F | Resp 16 | Ht 68.0 in | Wt 176.0 lb

## 2015-07-02 DIAGNOSIS — I10 Essential (primary) hypertension: Secondary | ICD-10-CM | POA: Diagnosis not present

## 2015-07-02 DIAGNOSIS — E785 Hyperlipidemia, unspecified: Secondary | ICD-10-CM | POA: Diagnosis not present

## 2015-07-02 LAB — CBC WITH DIFFERENTIAL/PLATELET
BASOS ABS: 0 10*3/uL (ref 0.0–0.1)
Basophils Relative: 0 % (ref 0.0–3.0)
Eosinophils Absolute: 0.1 10*3/uL (ref 0.0–0.7)
Eosinophils Relative: 0.8 % (ref 0.0–5.0)
HCT: 42.8 % (ref 39.0–52.0)
Hemoglobin: 14.3 g/dL (ref 13.0–17.0)
LYMPHS PCT: 11.8 % — AB (ref 12.0–46.0)
Lymphs Abs: 1.1 10*3/uL (ref 0.7–4.0)
MCHC: 33.3 g/dL (ref 30.0–36.0)
MCV: 94.1 fl (ref 78.0–100.0)
MONOS PCT: 6.1 % (ref 3.0–12.0)
Monocytes Absolute: 0.6 10*3/uL (ref 0.1–1.0)
NEUTROS PCT: 81.3 % — AB (ref 43.0–77.0)
Neutro Abs: 7.4 10*3/uL (ref 1.4–7.7)
Platelets: 301 10*3/uL (ref 150.0–400.0)
RBC: 4.55 Mil/uL (ref 4.22–5.81)
RDW: 13 % (ref 11.5–15.5)
WBC: 9.1 10*3/uL (ref 4.0–10.5)

## 2015-07-02 LAB — LIPID PANEL
CHOLESTEROL: 119 mg/dL (ref 0–200)
HDL: 27.5 mg/dL — AB (ref >39.00)
LDL Cholesterol: 75 mg/dL (ref 0–99)
NONHDL: 91.5
Total CHOL/HDL Ratio: 4
Triglycerides: 84 mg/dL (ref 0.0–149.0)
VLDL: 16.8 mg/dL (ref 0.0–40.0)

## 2015-07-02 LAB — BASIC METABOLIC PANEL
BUN: 20 mg/dL (ref 6–23)
CHLORIDE: 102 meq/L (ref 96–112)
CO2: 29 mEq/L (ref 19–32)
Calcium: 9.3 mg/dL (ref 8.4–10.5)
Creatinine, Ser: 1.19 mg/dL (ref 0.40–1.50)
GFR: 61.75 mL/min (ref >60.00)
Glucose, Bld: 80 mg/dL (ref 70–99)
Potassium: 4.3 mEq/L (ref 3.5–5.1)
Sodium: 138 mEq/L (ref 135–145)

## 2015-07-02 LAB — HEPATIC FUNCTION PANEL
ALBUMIN: 4.1 g/dL (ref 3.5–5.2)
ALT: 22 U/L (ref 0–53)
AST: 24 U/L (ref 0–37)
Alkaline Phosphatase: 38 U/L — ABNORMAL LOW (ref 39–117)
BILIRUBIN DIRECT: 0.2 mg/dL (ref 0.0–0.3)
TOTAL PROTEIN: 6.8 g/dL (ref 6.0–8.3)
Total Bilirubin: 0.7 mg/dL (ref 0.2–1.2)

## 2015-07-02 NOTE — Assessment & Plan Note (Signed)
con't simvastatin and fenofibrate Check labs today
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
## 2015-07-02 NOTE — Progress Notes (Signed)
Pre visit review using our clinic review tool, if applicable. No additional management support is needed unless otherwise documented below in the visit note. 

## 2015-07-02 NOTE — Assessment & Plan Note (Signed)
con't metoprolol and cardura

## 2015-07-02 NOTE — Patient Instructions (Signed)

## 2015-07-28 ENCOUNTER — Other Ambulatory Visit: Payer: Self-pay | Admitting: Family Medicine

## 2015-08-19 DIAGNOSIS — Z23 Encounter for immunization: Secondary | ICD-10-CM | POA: Diagnosis not present

## 2015-09-28 DIAGNOSIS — C61 Malignant neoplasm of prostate: Secondary | ICD-10-CM | POA: Diagnosis not present

## 2015-10-21 ENCOUNTER — Other Ambulatory Visit: Payer: Self-pay | Admitting: Family Medicine

## 2015-10-21 DIAGNOSIS — C61 Malignant neoplasm of prostate: Secondary | ICD-10-CM | POA: Diagnosis not present

## 2015-10-21 DIAGNOSIS — R351 Nocturia: Secondary | ICD-10-CM | POA: Diagnosis not present

## 2015-11-20 ENCOUNTER — Other Ambulatory Visit: Payer: Self-pay | Admitting: Family Medicine

## 2015-12-17 ENCOUNTER — Telehealth: Payer: Self-pay

## 2015-12-17 ENCOUNTER — Telehealth: Payer: Self-pay | Admitting: Family Medicine

## 2015-12-17 NOTE — Telephone Encounter (Signed)
To MD to review.     KP 

## 2015-12-17 NOTE — Telephone Encounter (Signed)
Relation to [id number] Call back number:[phone]  Reason for call:  Patient called to confirm appointment for 12/21/2015 at 1:3pm and patient states he has bad news for Maudie Mercury and Dr. Etter Sjogren and informed his wife passed away 01/01/16. I offered my condolences and stated kim will follow up he said thank you and he appreciAted that.

## 2015-12-21 ENCOUNTER — Ambulatory Visit (INDEPENDENT_AMBULATORY_CARE_PROVIDER_SITE_OTHER): Payer: Medicare Other | Admitting: Family Medicine

## 2015-12-21 ENCOUNTER — Encounter: Payer: Self-pay | Admitting: Family Medicine

## 2015-12-21 VITALS — BP 148/82 | HR 74 | Temp 97.8°F | Ht 68.0 in | Wt 166.4 lb

## 2015-12-21 DIAGNOSIS — H3530 Unspecified macular degeneration: Secondary | ICD-10-CM | POA: Diagnosis not present

## 2015-12-21 DIAGNOSIS — E785 Hyperlipidemia, unspecified: Secondary | ICD-10-CM | POA: Diagnosis not present

## 2015-12-21 DIAGNOSIS — I10 Essential (primary) hypertension: Secondary | ICD-10-CM

## 2015-12-21 DIAGNOSIS — Z0000 Encounter for general adult medical examination without abnormal findings: Secondary | ICD-10-CM

## 2015-12-21 DIAGNOSIS — F411 Generalized anxiety disorder: Secondary | ICD-10-CM

## 2015-12-21 LAB — POCT URINALYSIS DIPSTICK
Bilirubin, UA: NEGATIVE
Blood, UA: NEGATIVE
GLUCOSE UA: NEGATIVE
KETONES UA: NEGATIVE
Leukocytes, UA: NEGATIVE
Nitrite, UA: NEGATIVE
Protein, UA: NEGATIVE
SPEC GRAV UA: 1.02
UROBILINOGEN UA: 0.2
pH, UA: 7

## 2015-12-21 MED ORDER — CLORAZEPATE DIPOTASSIUM 7.5 MG PO TABS: ORAL_TABLET | ORAL | Status: DC

## 2015-12-21 MED ORDER — METOPROLOL SUCCINATE ER 200 MG PO TB24: ORAL_TABLET | ORAL | Status: DC

## 2015-12-21 NOTE — Patient Instructions (Signed)

## 2015-12-21 NOTE — Progress Notes (Signed)
Pre visit review using our clinic review tool, if applicable. No additional management support is needed unless otherwise documented below in the visit note. 

## 2015-12-21 NOTE — Progress Notes (Signed)
Subjective:   Mark Stark. is a 81 y.o. male who presents for Medicare Annual/Subsequent preventive examination.  Review of Systems:   Review of Systems  Constitutional: Negative for activity change, appetite change and fatigue.  HENT: Negative for hearing loss, congestion, tinnitus and ear discharge.   Eyes: Negative for visual disturbance (see optho q1y -- vision corrected to 20/20 with glasses).  Respiratory: Negative for cough, chest tightness and shortness of breath.   Cardiovascular: Negative for chest pain, palpitations and leg swelling.  Gastrointestinal: Negative for abdominal pain, diarrhea, constipation and abdominal distention.  Genitourinary: Negative for urgency, frequency, decreased urine volume and difficulty urinating.  Musculoskeletal: Negative for back pain, arthralgias and gait problem.  Skin: Negative for color change, pallor and rash.  Neurological: Negative for dizziness, light-headedness, numbness and headaches.  Hematological: Negative for adenopathy. Does not bruise/bleed easily.  Psychiatric/Behavioral: Negative for suicidal ideas, confusion, sleep disturbance, self-injury, dysphoric mood, decreased concentration and agitation.  Pt is able to read and write and can do all ADLs No risk for falling No abuse/ violence in home           Objective:    Vitals: BP 148/82 mmHg  Pulse 74  Temp(Src) 97.8 F (36.6 C) (Oral)  Ht 5' 8"  (1.727 m)  Wt 166 lb 6.4 oz (75.479 kg)  BMI 25.31 kg/m2  SpO2 98% BP 148/82 mmHg  Pulse 74  Temp(Src) 97.8 F (36.6 C) (Oral)  Ht 5' 8"  (1.727 m)  Wt 166 lb 6.4 oz (75.479 kg)  BMI 25.31 kg/m2  SpO2 98% General appearance: alert, cooperative, appears stated age and no distress Head: Normocephalic, without obvious abnormality, atraumatic Eyes: negative findings: lids and lashes normal, conjunctivae and sclerae normal and pupils equal, round, reactive to light and accomodation Ears: normal TM's and external ear  canals both ears Nose: Nares normal. Septum midline. Mucosa normal. No drainage or sinus tenderness. Throat: lips, mucosa, and tongue normal; teeth and gums normal Neck: no adenopathy, no carotid bruit, no JVD, supple, symmetrical, trachea midline and thyroid not enlarged, symmetric, no tenderness/mass/nodules Back: symmetric, no curvature. ROM normal. No CVA tenderness. Lungs: clear to auscultation bilaterally Chest wall: no tenderness Heart: regular rate and rhythm, S1, S2 normal, no murmur, click, rub or gallop Abdomen: soft, non-tender; bowel sounds normal; no masses,  no organomegaly Male genitalia: deferred Rectal: deferred Extremities: extremities normal, atraumatic, no cyanosis or edema Pulses: 2+ and symmetric Skin: Skin color, texture, turgor normal. No rashes or lesions Lymph nodes: Cervical, supraclavicular, and axillary nodes normal. Neurologic: Alert and oriented X 3, normal strength and tone. Normal symmetric reflexes. Normal coordination and gait Psych-- pt denies depression or anxiety--- his wife recently passed away.  He states he is not depressed he is just sad.   Tobacco History  Smoking status  . Never Smoker   Smokeless tobacco  . Never Used     Counseling given: Not Answered   Past Medical History  Diagnosis Date  . Anxiety   . Diverticulitis   . Hypertension   . Inferior mesenteric vein thrombosis ([place])   . Dyslipidemia   . Prostate cancer [facility])    Past Surgical History  Procedure Laterality Date  . Cataract extraction  01/27/2010    right  . Cataract extraction  03/04/2010    left  . Prostate seed implants    . Prostate surgery     Family History  Problem Relation Age of Onset  . Dementia Mother   . Alzheimer's  disease Mother   . Mental illness Mother     ALZ DISEASE  . Cancer Brother 52    pancreatic cancer   History  Sexual Activity  . Sexual Activity:  . Partners: Female    Outpatient Encounter Prescriptions as of 12/21/2015    Medication Sig  . aspirin 81 MG tablet Take 81 mg by mouth daily.    [name] b complex vitamins tablet Take 1 tablet by mouth daily.    . Calcium Carbonate-Vitamin D (CALTRATE 600+D) 600-400 MG-UNIT per tablet Take 1 tablet by mouth daily.    . clorazepate (TRANXENE) 7.5 MG tablet TAKE 1 TABLET TWICE A DAY AS NEEDED FOR ANXIETY  . cromolyn (NASALCROM) 5.2 MG/ACT nasal spray 2 sprays by Nasal route 2 (two) times daily.    [name] doxazosin (CARDURA) 4 MG tablet TAKE 1 TABLET BY MOUTH AT BEDTIME  . fenofibrate 160 MG tablet Take 1 tablet (160 mg total) by mouth daily.  . fish oil-omega-3 fatty acids 1000 MG capsule Take 4 capsules by mouth daily.   . metoprolol (TOPROL-XL) 200 MG 24 hr tablet Take 1/2 tablet by mouth once daily  . Multiple Vitamins-Minerals (PRESERVISION AREDS 2 PO) Take 1 capsule by mouth daily.  . simvastatin (ZOCOR) 20 MG tablet TAKE 1 TABLET (20 MG TOTAL) BY MOUTH AT BEDTIME.  . [DISCONTINUED] clorazepate (TRANXENE) 7.5 MG tablet TAKE 1 TABLET TWICE A DAY AS NEEDED FOR ANXIETY  . [DISCONTINUED] metoprolol (TOPROL-XL) 200 MG 24 hr tablet Take 1/2 tablet by mouth once daily   No facility-administered encounter medications on file as of 12/21/2015.    Activities of Daily Living In your present state of health, do you have any difficulty performing the following activities: 12/21/2015  Hearing? N  Vision? N  Difficulty concentrating or making decisions? N  Walking or climbing stairs? N  Dressing or bathing? N  Doing errands, shopping? N    Patient Care Team: Rosalita Chessman, DO as PCP - General Gerarda Fraction, MD as Referring Physician (Ophthalmology) Cleon Gustin, MD as Consulting Physician (Urology) Druscilla Brownie, MD as Consulting Physician (Dermatology)   Assessment:    CPE: Exercise Activities and Dietary recommendations---walk for an hour as fast as he can    Goals    None     Fall Risk Fall Risk  12/21/2015 12/08/2014 09/23/2013  Falls in the past year? No  No No   Depression Screen PHQ 2/9 Scores 12/21/2015 12/08/2014 09/23/2013  PHQ - 2 Score 1 0 0    Cognitive Testing 30/30 MMSE  Immunization History  Administered Date(s) Administered  . Influenza Whole 11/14/2007, 09/30/2008, 09/08/2009, 08/31/2010, 09/03/2013  . Influenza, High Dose Seasonal PF 08/19/2015  . Influenza-Unspecified 09/07/2014  . Pneumococcal Conjugate-13 09/07/2014  . Pneumococcal Polysaccharide-23 09/22/2009  . Tdap 12/25/2012  . Zoster 03/19/2008   Screening Tests Health Maintenance  Topic Date Due  . INFLUENZA VACCINE  07/25/2016  . TETANUS/TDAP  12/25/2022  . ZOSTAVAX  Completed  . PNA vac Low Risk Adult  Completed      Plan:    see avs During the course of the visit the patient was educated and counseled about the following appropriate screening and preventive services:   Vaccines to include Pneumoccal, Influenza, Hepatitis B, Td, Zostavax, HCV  Electrocardiogram  Cardiovascular Disease  Colorectal cancer screening  Diabetes screening  Prostate Cancer Screening  Glaucoma screening  Nutrition counseling   Smoking cessation counseling  Patient Instructions (the written plan) was given to the patient.  1.  Essential hypertension stable - metoprolol (TOPROL-XL) 200 MG 24 hr tablet; Take 1/2 tablet by mouth once daily  Dispense: 90 tablet; Refill: 3 - Comp Met (CMET) - CBC with Differential/Platelet - POCT urinalysis dipstick  2. Generalized anxiety disorder   - clorazepate (TRANXENE) 7.5 MG tablet; TAKE 1 TABLET TWICE A DAY AS NEEDED FOR ANXIETY  Dispense: 60 tablet; Refill: 5 - POCT urinalysis dipstick  3. Hyperlipidemia   - Comp Met (CMET) - Lipid panel - POCT urinalysis dipstick  4. Macular degeneration    5. Routine history and physical examination of adult    6. Medicare annual wellness visit, subsequent     Garnet Koyanagi, DO  12/21/2015

## 2015-12-21 NOTE — Telephone Encounter (Signed)
Spoke with pt at [facility]

## 2015-12-22 LAB — LIPID PANEL
CHOLESTEROL: 115 mg/dL (ref 0–200)
HDL: 44.3 mg/dL (ref >39.00)
LDL Cholesterol: 56 mg/dL (ref 0–99)
NONHDL: 70.28
Total CHOL/HDL Ratio: 3
Triglycerides: 73 mg/dL (ref 0.0–149.0)
VLDL: 14.6 mg/dL (ref 0.0–40.0)

## 2015-12-22 LAB — CBC WITH DIFFERENTIAL/PLATELET
BASOS ABS: 0 10*3/uL (ref 0.0–0.1)
Basophils Relative: 0.4 % (ref 0.0–3.0)
EOS ABS: 0.1 10*3/uL (ref 0.0–0.7)
Eosinophils Relative: 1.2 % (ref 0.0–5.0)
HCT: 44.9 % (ref 39.0–52.0)
Hemoglobin: 14.9 g/dL (ref 13.0–17.0)
LYMPHS ABS: 1.4 10*3/uL (ref 0.7–4.0)
Lymphocytes Relative: 23.6 % (ref 12.0–46.0)
MCHC: 33.2 g/dL (ref 30.0–36.0)
MCV: 96.4 fl (ref 78.0–100.0)
Monocytes Absolute: 0.5 10*3/uL (ref 0.1–1.0)
Monocytes Relative: 8.1 % (ref 3.0–12.0)
NEUTROS ABS: 3.9 10*3/uL (ref 1.4–7.7)
NEUTROS PCT: 66.7 % (ref 43.0–77.0)
PLATELETS: 170 10*3/uL (ref 150.0–400.0)
RBC: 4.66 Mil/uL (ref 4.22–5.81)
RDW: 13.4 % (ref 11.5–15.5)
WBC: 5.8 10*3/uL (ref 4.0–10.5)

## 2015-12-22 LAB — COMPREHENSIVE METABOLIC PANEL
ALK PHOS: 31 U/L — AB (ref 39–117)
ALT: 19 U/L (ref 0–53)
AST: 24 U/L (ref 0–37)
Albumin: 4.3 g/dL (ref 3.5–5.2)
BILIRUBIN TOTAL: 0.9 mg/dL (ref 0.2–1.2)
BUN: 21 mg/dL (ref 6–23)
CO2: 29 meq/L (ref 19–32)
CREATININE: 1.19 mg/dL (ref 0.40–1.50)
Calcium: 9.8 mg/dL (ref 8.4–10.5)
Chloride: 102 mEq/L (ref 96–112)
GFR: 61.68 mL/min (ref >60.00)
Glucose, Bld: 83 mg/dL (ref 70–99)
Potassium: 4.2 mEq/L (ref 3.5–5.1)
Sodium: 139 mEq/L (ref 135–145)
TOTAL PROTEIN: 6.6 g/dL (ref 6.0–8.3)

## 2015-12-22 NOTE — Telephone Encounter (Signed)
Pre Visit call completed. 

## 2016-01-10 DIAGNOSIS — Z1212 Encounter for screening for malignant neoplasm of rectum: Secondary | ICD-10-CM | POA: Diagnosis not present

## 2016-01-10 DIAGNOSIS — Z1211 Encounter for screening for malignant neoplasm of colon: Secondary | ICD-10-CM | POA: Diagnosis not present

## 2016-01-20 ENCOUNTER — Other Ambulatory Visit: Payer: Self-pay | Admitting: Family Medicine

## 2016-01-20 LAB — COLOGUARD: Cologuard: NEGATIVE

## 2016-01-20 NOTE — Telephone Encounter (Signed)
Refilled patients rx refill  #30 with 5 rf

## 2016-02-10 DIAGNOSIS — H353190 Nonexudative age-related macular degeneration, unspecified eye, stage unspecified: Secondary | ICD-10-CM | POA: Diagnosis not present

## 2016-02-10 DIAGNOSIS — Z961 Presence of intraocular lens: Secondary | ICD-10-CM | POA: Diagnosis not present

## 2016-02-10 DIAGNOSIS — H35373 Puckering of macula, bilateral: Secondary | ICD-10-CM | POA: Diagnosis not present

## 2016-02-10 DIAGNOSIS — H43813 Vitreous degeneration, bilateral: Secondary | ICD-10-CM | POA: Diagnosis not present

## 2016-02-18 ENCOUNTER — Telehealth: Payer: Self-pay | Admitting: Family Medicine

## 2016-02-18 NOTE — Telephone Encounter (Signed)
Caller name: Borden  Relation to pt: self Call back number: [phone] Pharmacy:  Reason for call: Pt came in office stating had test (Cologuard) done on 01-10-16 that was sent to him by Dr. Etter Sjogren and pt is wanting to know about the results. Pt states called [facility] to verify if they received the package, they confirmed that they did. Please advise.

## 2016-02-21 NOTE — Telephone Encounter (Signed)
Patient aware that results were Normal and he verbalized understanding.    KP

## 2016-02-23 ENCOUNTER — Encounter: Payer: Self-pay | Admitting: Family Medicine

## 2016-02-23 ENCOUNTER — Other Ambulatory Visit: Payer: Self-pay

## 2016-02-23 DIAGNOSIS — I10 Essential (primary) hypertension: Secondary | ICD-10-CM

## 2016-02-23 MED ORDER — METOPROLOL SUCCINATE ER 200 MG PO TB24: ORAL_TABLET | ORAL | Status: DC

## 2016-02-23 MED ORDER — FENOFIBRATE 160 MG PO TABS: ORAL_TABLET | ORAL | Status: DC

## 2016-02-23 MED ORDER — DOXAZOSIN MESYLATE 4 MG PO TABS: 4.0000 mg | ORAL_TABLET | Freq: Every day | ORAL | Status: DC

## 2016-03-30 DIAGNOSIS — C61 Malignant neoplasm of prostate: Secondary | ICD-10-CM | POA: Diagnosis not present

## 2016-04-06 DIAGNOSIS — C61 Malignant neoplasm of prostate: Secondary | ICD-10-CM | POA: Diagnosis not present

## 2016-04-06 DIAGNOSIS — R351 Nocturia: Secondary | ICD-10-CM | POA: Diagnosis not present

## 2016-04-06 DIAGNOSIS — Z0000 Encounter for general adult medical examination without abnormal findings: Secondary | ICD-10-CM | POA: Diagnosis not present

## 2016-06-12 DIAGNOSIS — L720 Epidermal cyst: Secondary | ICD-10-CM | POA: Diagnosis not present

## 2016-06-12 DIAGNOSIS — L821 Other seborrheic keratosis: Secondary | ICD-10-CM | POA: Diagnosis not present

## 2016-06-12 DIAGNOSIS — D225 Melanocytic nevi of trunk: Secondary | ICD-10-CM | POA: Diagnosis not present

## 2016-06-12 DIAGNOSIS — L814 Other melanin hyperpigmentation: Secondary | ICD-10-CM | POA: Diagnosis not present

## 2016-06-12 DIAGNOSIS — L570 Actinic keratosis: Secondary | ICD-10-CM | POA: Diagnosis not present

## 2016-06-12 DIAGNOSIS — D1801 Hemangioma of skin and subcutaneous tissue: Secondary | ICD-10-CM | POA: Diagnosis not present

## 2016-06-13 ENCOUNTER — Other Ambulatory Visit: Payer: Self-pay | Admitting: Family Medicine

## 2016-07-28 DIAGNOSIS — Z23 Encounter for immunization: Secondary | ICD-10-CM | POA: Diagnosis not present

## 2016-09-12 ENCOUNTER — Other Ambulatory Visit: Payer: Self-pay | Admitting: Family Medicine

## 2016-09-12 DIAGNOSIS — F411 Generalized anxiety disorder: Secondary | ICD-10-CM

## 2016-09-12 NOTE — Telephone Encounter (Signed)
Last seen 12/21/15 Last filled #60-5 refills Please advise PC

## 2016-09-15 ENCOUNTER — Other Ambulatory Visit: Payer: Self-pay | Admitting: Family Medicine

## 2016-10-25 ENCOUNTER — Other Ambulatory Visit: Payer: Self-pay | Admitting: Family Medicine

## 2016-10-25 DIAGNOSIS — F411 Generalized anxiety disorder: Secondary | ICD-10-CM

## 2016-10-25 NOTE — Telephone Encounter (Signed)
Spoke with [facility] pharmacy. Tranxene is on backorder nationwide. Pharmacy is unsure of when it will be back in stock. Please advise.

## 2016-10-25 NOTE — Telephone Encounter (Signed)
Last OV: 12/21/15 Last filled: 09/12/16, #60, 3 Rf Sig:: TAKE 1 TABLET BY MOUTH TWICE A DAY AS NEEDED FOR ANXIETY UDS: Not on file

## 2016-10-27 MED ORDER — ALPRAZOLAM 0.25 MG PO TABS: 0.2500 mg | ORAL_TABLET | Freq: Two times a day (BID) | ORAL | 0 refills | Status: DC | PRN

## 2016-10-27 NOTE — Telephone Encounter (Addendum)
Rx printed, to PCP for signature. Pt notified of medication change and not to use Xanax and clorazepate concurrently.

## 2016-10-27 NOTE — Telephone Encounter (Signed)
Can try xanax 0.25  1 po bid prn #60

## 2016-10-27 NOTE — Addendum Note (Signed)
Addended by: Dorrene German on: 10/27/2016 02:29 PM   Modules accepted: Orders

## 2016-11-23 DIAGNOSIS — H353190 Nonexudative age-related macular degeneration, unspecified eye, stage unspecified: Secondary | ICD-10-CM | POA: Diagnosis not present

## 2016-11-23 DIAGNOSIS — H353130 Nonexudative age-related macular degeneration, bilateral, stage unspecified: Secondary | ICD-10-CM | POA: Diagnosis not present

## 2016-11-23 DIAGNOSIS — H353132 Nonexudative age-related macular degeneration, bilateral, intermediate dry stage: Secondary | ICD-10-CM | POA: Diagnosis not present

## 2016-11-23 DIAGNOSIS — Z961 Presence of intraocular lens: Secondary | ICD-10-CM | POA: Diagnosis not present

## 2016-11-23 DIAGNOSIS — H43813 Vitreous degeneration, bilateral: Secondary | ICD-10-CM | POA: Diagnosis not present

## 2016-11-23 DIAGNOSIS — H35373 Puckering of macula, bilateral: Secondary | ICD-10-CM | POA: Diagnosis not present

## 2016-11-27 DIAGNOSIS — C61 Malignant neoplasm of prostate: Secondary | ICD-10-CM | POA: Diagnosis not present

## 2016-12-10 ENCOUNTER — Other Ambulatory Visit: Payer: Self-pay | Admitting: Family Medicine

## 2016-12-14 DIAGNOSIS — Z23 Encounter for immunization: Secondary | ICD-10-CM | POA: Diagnosis not present

## 2016-12-20 ENCOUNTER — Telehealth: Payer: Self-pay | Admitting: *Deleted

## 2016-12-20 NOTE — Progress Notes (Deleted)
Pre visit review using our clinic review tool, if applicable. No additional management support is needed unless otherwise documented below in the visit note. 

## 2016-12-20 NOTE — Telephone Encounter (Signed)
Schedule for 1pm Friday Dec 29,2017.

## 2016-12-20 NOTE — Progress Notes (Deleted)
Subjective:   Mark Click. is a 82 y.o. male who presents for Medicare Annual/Subsequent preventive examination.  Review of Systems:  No ROS.  Medicare Wellness Visit.    Sleep patterns:  Home Safety/Smoke Alarms:   Living environment; residence and Firearm Safety:  Seat Belt Safety/Bike Helmet:   Counseling:   Eye Exam-  Dental-  Male:   CCS-     PSA-  Lab Results  Component Value Date   PSA 0.35 12/27/2009   PSA 0.38 09/22/2009        Objective:    Vitals: There were no vitals taken for this visit.  There is no height or weight on file to calculate BMI.  Tobacco History  Smoking Status  . Never Smoker  Smokeless Tobacco  . Never Used     Counseling given: Not Answered   Past Medical History:  Diagnosis Date  . Anxiety   . Diverticulitis   . Dyslipidemia   . Hypertension   . 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 Prostate cancer [facility])    Past Surgical History:  Procedure Laterality Date  . CATARACT EXTRACTION  01/27/2010   right  . CATARACT EXTRACTION  03/04/2010   left  . Prostate seed implants    . PROSTATE SURGERY     Family History  Problem Relation Age of Onset  . Dementia Mother   . Alzheimer's disease Mother   . Mental illness Mother     ALZ DISEASE  . Cancer Brother 18    pancreatic cancer   History  Sexual Activity  . Sexual activity: Yes  . Partners: Female    Outpatient Encounter Prescriptions as of 12/22/2016  Medication Sig  . ALPRAZolam (XANAX) 0.25 MG tablet Take 1 tablet (0.25 mg total) by mouth 2 (two) times daily as needed for anxiety.  [name] aspirin 81 MG tablet Take 81 mg by mouth daily.    [name] b complex vitamins tablet Take 1 tablet by mouth daily.    . Calcium Carbonate-Vitamin D (CALTRATE 600+D) 600-400 MG-UNIT per tablet Take 1 tablet by mouth daily.    . cromolyn (NASALCROM) 5.2 MG/ACT nasal spray 2 sprays by Nasal route 2 (two) times daily.    [name] doxazosin (CARDURA) 4 MG tablet TAKE 1 TABLET BY MOUTH AT  BEDTIME  . fenofibrate 160 MG tablet TAKE 1 TABLET BY MOUTH EVERY DAY  . fish oil-omega-3 fatty acids 1000 MG capsule Take 4 capsules by mouth daily.   . metoprolol (TOPROL-XL) 200 MG 24 hr tablet Take 1/2 tablet by mouth once daily  . Multiple Vitamins-Minerals (PRESERVISION AREDS 2 PO) Take 1 capsule by mouth daily.  . simvastatin (ZOCOR) 20 MG tablet TAKE 1 TABLET (20 MG TOTAL) BY MOUTH AT BEDTIME.   No facility-administered encounter medications on file as of 12/22/2016.     Activities of Daily Living In your present state of health, do you have any difficulty performing the following activities: 12/21/2015  Hearing? N  Vision? N  Difficulty concentrating or making decisions? N  Walking or climbing stairs? N  Dressing or bathing? N  Doing errands, shopping? N  Some recent data might be hidden    Patient Care Team: [name], DO as PCP - General Gerarda Fraction, MD as Referring Physician (Ophthalmology) Cleon Gustin, MD as Consulting Physician (Urology) Druscilla Brownie, MD as Consulting Physician (Dermatology)   Assessment:    Physical assessment deferred to PCP.  Exercise Activities and Dietary recommendations  Diet (meal preparation, eat out, water intake, caffeinated beverages, dairy products, fruits and vegetables): {Desc; diets:16563} Breakfast: Lunch:  Dinner:      Goals    None     Fall Risk Fall Risk  12/21/2015 12/08/2014 09/23/2013  Falls in the past year? No No No   Depression Screen PHQ 2/9 Scores 12/21/2015 12/08/2014 09/23/2013  PHQ - 2 Score 1 0 0    Cognitive Function        Immunization History  Administered Date(s) Administered  . Influenza Whole 11/14/2007, 09/30/2008, 09/08/2009, 08/31/2010, 09/03/2013  . Influenza, High Dose Seasonal PF 08/19/2015  . Influenza-Unspecified 09/07/2014  . Pneumococcal Conjugate-13 09/07/2014  . Pneumococcal Polysaccharide-23 09/22/2009  . Tdap 12/25/2012  . Zoster 03/19/2008    Screening Tests Health Maintenance  Topic Date Due  . INFLUENZA VACCINE  07/25/2016  . TETANUS/TDAP  12/25/2022  . ZOSTAVAX  Completed  . PNA vac Low Risk Adult  Completed      Plan:    During the course of the visit the patient was educated and counseled about the following appropriate screening and preventive services:   Vaccines to include Pneumoccal, Influenza, Hepatitis B, Td, Zostavax, HCV  Cardiovascular Disease  Colorectal cancer screening  Diabetes screening  Prostate Cancer Screening  Glaucoma screening  Nutrition counseling   Smoking cessation counseling  Patient Instructions (the written plan) was given to the patient.    Shela Nevin, [place]  12/20/2016

## 2016-12-22 ENCOUNTER — Ambulatory Visit: Payer: Medicare Other | Admitting: *Deleted

## 2016-12-22 ENCOUNTER — Encounter: Payer: Self-pay | Admitting: Family Medicine

## 2016-12-22 ENCOUNTER — Ambulatory Visit (INDEPENDENT_AMBULATORY_CARE_PROVIDER_SITE_OTHER): Payer: Medicare Other | Admitting: Family Medicine

## 2016-12-22 VITALS — BP 142/62 | HR 58 | Temp 97.8°F | Resp 16 | Ht 68.0 in | Wt 169.4 lb

## 2016-12-22 DIAGNOSIS — E785 Hyperlipidemia, unspecified: Secondary | ICD-10-CM

## 2016-12-22 DIAGNOSIS — F411 Generalized anxiety disorder: Secondary | ICD-10-CM | POA: Diagnosis not present

## 2016-12-22 DIAGNOSIS — I10 Essential (primary) hypertension: Secondary | ICD-10-CM | POA: Diagnosis not present

## 2016-12-22 LAB — COMPREHENSIVE METABOLIC PANEL
ALK PHOS: 32 U/L — AB (ref 39–117)
ALT: 15 U/L (ref 0–53)
AST: 22 U/L (ref 0–37)
Albumin: 4.3 g/dL (ref 3.5–5.2)
BUN: 21 mg/dL (ref 6–23)
CO2: 31 mEq/L (ref 19–32)
Calcium: 9.3 mg/dL (ref 8.4–10.5)
Chloride: 101 mEq/L (ref 96–112)
Creatinine, Ser: 1.06 mg/dL (ref 0.40–1.50)
GFR: 70.33 mL/min (ref >60.00)
GLUCOSE: 86 mg/dL (ref 70–99)
POTASSIUM: 4 meq/L (ref 3.5–5.1)
Sodium: 139 mEq/L (ref 135–145)
TOTAL PROTEIN: 6.6 g/dL (ref 6.0–8.3)
Total Bilirubin: 0.9 mg/dL (ref 0.2–1.2)

## 2016-12-22 LAB — CBC WITH DIFFERENTIAL/PLATELET
Basophils Absolute: 0 10*3/uL (ref 0.0–0.1)
Basophils Relative: 0.3 % (ref 0.0–3.0)
EOS PCT: 5.3 % — AB (ref 0.0–5.0)
Eosinophils Absolute: 0.4 10*3/uL (ref 0.0–0.7)
HCT: 41.2 % (ref 39.0–52.0)
Hemoglobin: 14.1 g/dL (ref 13.0–17.0)
LYMPHS ABS: 1.5 10*3/uL (ref 0.7–4.0)
Lymphocytes Relative: 19.8 % (ref 12.0–46.0)
MCHC: 34.2 g/dL (ref 30.0–36.0)
MCV: 95.1 fl (ref 78.0–100.0)
MONO ABS: 0.7 10*3/uL (ref 0.1–1.0)
Monocytes Relative: 9.7 % (ref 3.0–12.0)
NEUTROS PCT: 64.9 % (ref 43.0–77.0)
Neutro Abs: 5 10*3/uL (ref 1.4–7.7)
Platelets: 208 10*3/uL (ref 150.0–400.0)
RBC: 4.33 Mil/uL (ref 4.22–5.81)
RDW: 13.2 % (ref 11.5–15.5)
WBC: 7.7 10*3/uL (ref 4.0–10.5)

## 2016-12-22 LAB — LIPID PANEL
Cholesterol: 107 mg/dL (ref 0–200)
HDL: 48.2 mg/dL (ref >39.00)
LDL Cholesterol: 49 mg/dL (ref 0–99)
NONHDL: 58.99
Total CHOL/HDL Ratio: 2
Triglycerides: 52 mg/dL (ref 0.0–149.0)
VLDL: 10.4 mg/dL (ref 0.0–40.0)

## 2016-12-22 MED ORDER — FENOFIBRATE 160 MG PO TABS: 160.0000 mg | ORAL_TABLET | Freq: Every day | ORAL | 1 refills | Status: DC

## 2016-12-22 MED ORDER — CLORAZEPATE DIPOTASSIUM 15 MG PO TABS: ORAL_TABLET | ORAL | 1 refills | Status: DC

## 2016-12-22 MED ORDER — LISINOPRIL 5 MG PO TABS: 5.0000 mg | ORAL_TABLET | Freq: Every day | ORAL | 2 refills | Status: DC

## 2016-12-22 MED ORDER — DOXAZOSIN MESYLATE 4 MG PO TABS: 4.0000 mg | ORAL_TABLET | Freq: Every day | ORAL | 1 refills | Status: DC

## 2016-12-22 MED ORDER — METOPROLOL SUCCINATE ER 200 MG PO TB24: ORAL_TABLET | ORAL | 1 refills | Status: DC

## 2016-12-22 MED ORDER — SIMVASTATIN 20 MG PO TABS: ORAL_TABLET | ORAL | 0 refills | Status: DC

## 2016-12-22 NOTE — Progress Notes (Signed)
Subjective:   Mark Stark. is a 82 y.o. male who presents for Medicare Annual/Subsequent preventive examination.  Review of Systems: NO ROS  Cardiac Risk Factors include: advanced age (>75men, >22 women);dyslipidemia;hypertension;male gender    Sleep patterns: Sleeps well.  Gets up at least once or twice at night to go to the bathroom. No night lights, though encouraged.  Home safety/Smoke Alarms:  Feels safe at home.  Smoke alarms present.   Firearm safety: No firearms. Seat belt safety/bike helmet:  Always wears seat belt.   Sun exposure:  Limited sun exposure.    Immunization:  UTD.    Objective:    Vitals: BP (!) 142/62 (BP Location: Right Arm, Patient Position: Sitting, Cuff Size: Normal)   Pulse (!) 58   Temp 97.8 F (36.6 C) (Oral)   Resp 16   Ht 5\' 8"  (1.727 m)   Wt 169 lb 6.4 oz (76.8 kg)   SpO2 97%   BMI 25.76 kg/m   Body mass index is 25.76 kg/m.  Tobacco History  Smoking Status  . Never Smoker  Smokeless Tobacco  . Never Used     Counseling given: No   Past Medical History:  Diagnosis Date  . Anxiety   . Diverticulitis   . Dyslipidemia   . Hypertension   . 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 Prostate cancer [facility])    Past Surgical History:  Procedure Laterality Date  . CATARACT EXTRACTION  01/27/2010   right  . CATARACT EXTRACTION  03/04/2010   left  . Prostate seed implants    . PROSTATE SURGERY     Family History  Problem Relation Age of Onset  . Dementia Mother   . Alzheimer's disease Mother   . Mental illness Mother     ALZ DISEASE  . Cancer Brother 20    pancreatic cancer   History  Sexual Activity  . Sexual activity: No    Outpatient Encounter Prescriptions as of 12/22/2016  Medication Sig  . aspirin 81 MG tablet Take 81 mg by mouth daily.    [name] b complex vitamins tablet Take 1 tablet by mouth daily.    . Calcium Carbonate-Vitamin D (CALTRATE 600+D) 600-400 MG-UNIT per tablet Take 1 tablet by mouth daily.      . cromolyn (NASALCROM) 5.2 MG/ACT nasal spray 2 sprays by Nasal route 2 (two) times daily.    [name] doxazosin (CARDURA) 4 MG tablet Take 1 tablet (4 mg total) by mouth at bedtime.  . fenofibrate 160 MG tablet Take 1 tablet (160 mg total) by mouth daily.  . fish oil-omega-3 fatty acids 1000 MG capsule Take 4 capsules by mouth daily.   [name] lisinopril (PRINIVIL,ZESTRIL) 5 MG tablet Take 1 tablet (5 mg total) by mouth daily.  . metoprolol (TOPROL-XL) 200 MG 24 hr tablet Take 1/2 tablet by mouth once daily  . Multiple Vitamins-Minerals (PRESERVISION AREDS 2 PO) Take 1 capsule by mouth daily.  . simvastatin (ZOCOR) 20 MG tablet TAKE 1 TABLET (20 MG TOTAL) BY MOUTH AT BEDTIME.  . [DISCONTINUED] doxazosin (CARDURA) 4 MG tablet TAKE 1 TABLET BY MOUTH AT BEDTIME  . [DISCONTINUED] fenofibrate 160 MG tablet TAKE 1 TABLET BY MOUTH EVERY DAY  . [DISCONTINUED] metoprolol (TOPROL-XL) 200 MG 24 hr tablet Take 1/2 tablet by mouth once daily  . [DISCONTINUED] simvastatin (ZOCOR) 20 MG tablet TAKE 1 TABLET (20 MG TOTAL) BY MOUTH AT BEDTIME.  . clorazepate (TRANXENE) 15 MG tablet Take 1/2 tablet by mouth once  daily (Patient not taking: Reported on 12/22/2016)  . [DISCONTINUED] ALPRAZolam (XANAX) 0.25 MG tablet Take 1 tablet (0.25 mg total) by mouth 2 (two) times daily as needed for anxiety. (Patient not taking: Reported on 12/22/2016)   No facility-administered encounter medications on file as of 12/22/2016.     Activities of Daily Living In your present state of health, do you have any difficulty performing the following activities: 12/22/2016  Hearing? N  Vision? N  Difficulty concentrating or making decisions? N  Walking or climbing stairs? N  Dressing or bathing? N  Doing errands, shopping? N  Preparing Food and eating ? N  Using the Toilet? N  In the past six months, have you accidently leaked urine? N  Do you have problems with loss of bowel control? N  Managing your Medications? N  Managing your  Finances? N  Housekeeping or managing your Housekeeping? N  Some recent data might be hidden    Patient Care Team: [name], DO as PCP - General Gerarda Fraction, MD as Referring Physician (Ophthalmology) Cleon Gustin, MD as Consulting Physician (Urology) Druscilla Brownie, MD as Consulting Physician (Dermatology)   Assessment:   Exercise Activities and Dietary recommendations Current Exercise Habits: Home exercise routine, Type of exercise: stretching;walking, Time (Minutes): 60, Frequency (Times/Week): 7, Weekly Exercise (Minutes/Week): 420   Diet:  Regular diet, relatively healthy.  Eats a lot of vegetables.  Drinks plenty of water.  Doesn't drink coffee or soft drinks.    Goals    . Maintain healthy diet and continue to exercise 6-7 days per week.      Fall Risk Fall Risk  12/22/2016 12/21/2015 12/08/2014 09/23/2013  Falls in the past year? No No No No  Risk for fall due to : History of fall(s);Impaired vision - - -   Depression Screen PHQ 2/9 Scores 12/22/2016 12/21/2015 12/08/2014 09/23/2013  PHQ - 2 Score 0 1 0 0    Cognitive Function MMSE - Mini Mental State Exam 12/22/2016  Orientation to time 5  Orientation to Place 5  Registration 3  Attention/ Calculation 5  Recall 3  Language- name 2 objects 2  Language- repeat 1  Language- follow 3 step command 3  Language- read & follow direction 1  Write a sentence 1  Copy design 1  Total score 30        Immunization History  Administered Date(s) Administered  . Influenza Whole 11/14/2007, 09/30/2008, 09/08/2009, 08/31/2010, 09/03/2013  . Influenza, High Dose Seasonal PF 08/19/2015  . Influenza-Unspecified 09/07/2014, 09/15/2016  . Pneumococcal Conjugate-13 09/07/2014  . Pneumococcal Polysaccharide-23 09/22/2009  . Tdap 12/25/2012  . Zoster 03/19/2008   Screening Tests Health Maintenance  Topic Date Due  . TETANUS/TDAP  12/25/2022  . INFLUENZA VACCINE  Completed  . ZOSTAVAX  Completed  . PNA  vac Low Risk Adult  Completed      Plan:  See provider's note.  During the course of the visit the patient was educated and counseled about the following appropriate screening and preventive services:   Vaccines to include Pneumoccal, Influenza, Hepatitis B, Td, Zostavax, HCV  Electrocardiogram  Cardiovascular Disease  Colorectal cancer screening  Diabetes screening  Prostate Cancer Screening  Glaucoma screening  Nutrition counseling   Smoking cessation counseling  Patient Instructions (the written plan) was given to the patient.    Rudene Anda, RN  12/22/2016

## 2016-12-22 NOTE — Progress Notes (Deleted)
Subjective:   Mark Stark. is a 82 y.o. male who presents for Medicare Annual/Subsequent preventive examination.  Review of Systems: No ROS.  Medicare Wellness Visit.    Sleep patterns: {SX; SLEEP PATTERNS:18802::"feels rested on waking","does not get up to void","gets up *** times nightly to void","*** hours nightly"}.   Home Safety/Smoke Alarms:   Living environment; residence and Firearm Safety: {Rehab home environment / accessibility:30080::"no firearms","firearms stored safely"}. Seat Belt Safety/Bike Helmet:   Counseling:   Eye Exam-  Dental-  Male:   CCS-     PSA-  Lab Results  Component Value Date   PSA 0.35 12/27/2009   PSA 0.38 09/22/2009        Objective:    Vitals: There were no vitals taken for this visit.  There is no height or weight on file to calculate BMI.  Tobacco History  Smoking Status  . Never Smoker  Smokeless Tobacco  . Never Used     Counseling given: Not Answered   Past Medical History:  Diagnosis Date  . Anxiety   . Diverticulitis   . Dyslipidemia   . Hypertension   . 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 Prostate cancer [facility])    Past Surgical History:  Procedure Laterality Date  . CATARACT EXTRACTION  01/27/2010   right  . CATARACT EXTRACTION  03/04/2010   left  . Prostate seed implants    . PROSTATE SURGERY     Family History  Problem Relation Age of Onset  . Dementia Mother   . Alzheimer's disease Mother   . Mental illness Mother     ALZ DISEASE  . Cancer Brother 76    pancreatic cancer   History  Sexual Activity  . Sexual activity: Yes  . Partners: Female    Outpatient Encounter Prescriptions as of 12/22/2016  Medication Sig  . ALPRAZolam (XANAX) 0.25 MG tablet Take 1 tablet (0.25 mg total) by mouth 2 (two) times daily as needed for anxiety.  [name] aspirin 81 MG tablet Take 81 mg by mouth daily.    [name] b complex vitamins tablet Take 1 tablet by mouth daily.    . Calcium Carbonate-Vitamin D (CALTRATE  600+D) 600-400 MG-UNIT per tablet Take 1 tablet by mouth daily.    . cromolyn (NASALCROM) 5.2 MG/ACT nasal spray 2 sprays by Nasal route 2 (two) times daily.    [name] doxazosin (CARDURA) 4 MG tablet TAKE 1 TABLET BY MOUTH AT BEDTIME  . fenofibrate 160 MG tablet TAKE 1 TABLET BY MOUTH EVERY DAY  . fish oil-omega-3 fatty acids 1000 MG capsule Take 4 capsules by mouth daily.   . metoprolol (TOPROL-XL) 200 MG 24 hr tablet Take 1/2 tablet by mouth once daily  . Multiple Vitamins-Minerals (PRESERVISION AREDS 2 PO) Take 1 capsule by mouth daily.  . simvastatin (ZOCOR) 20 MG tablet TAKE 1 TABLET (20 MG TOTAL) BY MOUTH AT BEDTIME.   No facility-administered encounter medications on file as of 12/22/2016.     Activities of Daily Living No flowsheet data found.  Patient Care Team: [name], DO as PCP - General Gerarda Fraction, MD as Referring Physician (Ophthalmology) Cleon Gustin, MD as Consulting Physician (Urology) Druscilla Brownie, MD as Consulting Physician (Dermatology)   Assessment:     Physical assessment deferred to PCP.  Exercise Activities and Dietary recommendations   Diet (meal preparation, eat out, water intake, caffeinated beverages, dairy products, fruits and vegetables): {Desc; diets:16563} Breakfast: Lunch:  Dinner:  Goals    None     Fall Risk Fall Risk  12/21/2015 12/08/2014 09/23/2013  Falls in the past year? No No No   Depression Screen PHQ 2/9 Scores 12/21/2015 12/08/2014 09/23/2013  PHQ - 2 Score 1 0 0    Cognitive Function        Immunization History  Administered Date(s) Administered  . Influenza Whole 11/14/2007, 09/30/2008, 09/08/2009, 08/31/2010, 09/03/2013  . Influenza, High Dose Seasonal PF 08/19/2015  . Influenza-Unspecified 09/07/2014  . Pneumococcal Conjugate-13 09/07/2014  . Pneumococcal Polysaccharide-23 09/22/2009  . Tdap 12/25/2012  . Zoster 03/19/2008   Screening Tests Health Maintenance  Topic Date Due  .  INFLUENZA VACCINE  07/25/2016  . TETANUS/TDAP  12/25/2022  . ZOSTAVAX  Completed  . PNA vac Low Risk Adult  Completed      Plan:    During the course of the visit the patient was educated and counseled about the following appropriate screening and preventive services:   Vaccines to include Pneumoccal, Influenza, Hepatitis B, Td, Zostavax, HCV  Electrocardiogram  Cardiovascular Disease  Colorectal cancer screening  Diabetes screening  Prostate Cancer Screening  Glaucoma screening  Nutrition counseling   Smoking cessation counseling  Patient Instructions (the written plan) was given to the patient.    Shela Nevin, [place]  12/22/2016

## 2016-12-22 NOTE — Patient Instructions (Signed)

## 2016-12-22 NOTE — Progress Notes (Signed)
Pre visit review using our clinic review tool, if applicable. No additional management support is needed unless otherwise documented below in the visit note. 

## 2016-12-22 NOTE — Progress Notes (Signed)
Subjective:    Patient ID: Mark Riles., male    DOB: [birth year], 82 y.o.   MRN: [id number]  Chief Complaint  Patient presents with  . Medicare Wellness    HPI Patient is in today for f/u bp and anxiety.  The tranzene worked better.  He would like to see if he can get that again--- pharmacy told him a while back it was no longer available.    Past Medical History:  Diagnosis Date  . Anxiety   . Diverticulitis   . Dyslipidemia   . Hypertension   . 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 Prostate cancer [facility])     Past Surgical History:  Procedure Laterality Date  . CATARACT EXTRACTION  01/27/2010   right  . CATARACT EXTRACTION  03/04/2010   left  . Prostate seed implants    . PROSTATE SURGERY      Family History  Problem Relation Age of Onset  . Dementia Mother   . Alzheimer's disease Mother   . Mental illness Mother     ALZ DISEASE  . Cancer Brother 7    pancreatic cancer    Social History   Social History  . Marital status: Married    Spouse name: N/A  . Number of children: N/A  . Years of education: N/A   Occupational History  . Professor---RETIRED Retired    Retired   Social History Main Topics  . Smoking status: Never Smoker  . Smokeless tobacco: Never Used  . Alcohol use No  . Drug use: No  . Sexual activity: Yes    Partners: Female   Other Topics Concern  . Not on file   Social History Narrative  . No narrative on file    Outpatient Medications Prior to Visit  Medication Sig Dispense Refill  . aspirin 81 MG tablet Take 81 mg by mouth daily.      [name] b complex vitamins tablet Take 1 tablet by mouth daily.      . Calcium Carbonate-Vitamin D (CALTRATE 600+D) 600-400 MG-UNIT per tablet Take 1 tablet by mouth daily.      . cromolyn (NASALCROM) 5.2 MG/ACT nasal spray 2 sprays by Nasal route 2 (two) times daily.      . fish oil-omega-3 fatty acids 1000 MG capsule Take 4 capsules by mouth daily.     . Multiple Vitamins-Minerals  (PRESERVISION AREDS 2 PO) Take 1 capsule by mouth daily.    [name] doxazosin (CARDURA) 4 MG tablet TAKE 1 TABLET BY MOUTH AT BEDTIME 90 tablet 1  . fenofibrate 160 MG tablet TAKE 1 TABLET BY MOUTH EVERY DAY 90 tablet 1  . metoprolol (TOPROL-XL) 200 MG 24 hr tablet Take 1/2 tablet by mouth once daily 90 tablet 1  . simvastatin (ZOCOR) 20 MG tablet TAKE 1 TABLET (20 MG TOTAL) BY MOUTH AT BEDTIME. 90 tablet 0  . ALPRAZolam (XANAX) 0.25 MG tablet Take 1 tablet (0.25 mg total) by mouth 2 (two) times daily as needed for anxiety. (Patient not taking: Reported on 12/22/2016) 60 tablet 0   No facility-administered medications prior to visit.     Allergies  Allergen Reactions  . Sulfonamide Derivatives     Review of Systems  Constitutional: Negative for chills, fever and malaise/fatigue.  HENT: Negative for congestion and hearing loss.   Eyes: Negative for discharge.  Respiratory: Negative for cough, sputum production and shortness of breath.   Cardiovascular: Negative for chest pain, palpitations and leg swelling.  Gastrointestinal:  Negative for abdominal pain, blood in stool, constipation, diarrhea, heartburn, nausea and vomiting.  Genitourinary: Negative for dysuria, frequency, hematuria and urgency.  Musculoskeletal: Negative for back pain, falls and myalgias.  Skin: Negative for rash.  Neurological: Negative for dizziness, sensory change, loss of consciousness, weakness and headaches.  Endo/Heme/Allergies: Negative for environmental allergies. Does not bruise/bleed easily.  Psychiatric/Behavioral: Negative for depression and suicidal ideas. The patient is not nervous/anxious and does not have insomnia.        Objective:    Physical Exam  Constitutional: He is oriented to person, place, and time. Vital signs are normal. He appears well-developed and well-nourished. He is sleeping.  HENT:  Head: Normocephalic and atraumatic.  Mouth/Throat: Oropharynx is clear and moist.  Eyes: EOM are  normal. Pupils are equal, round, and reactive to light.  Neck: Normal range of motion. Neck supple. No thyromegaly present.  Cardiovascular: Normal rate and regular rhythm.   No murmur heard. Pulmonary/Chest: Effort normal and breath sounds normal. No respiratory distress. He has no wheezes. He has no rales. He exhibits no tenderness.  Musculoskeletal: He exhibits no edema or tenderness.  Neurological: He is alert and oriented to person, place, and time.  Skin: Skin is warm and dry.  Psychiatric: He has a normal mood and affect. His behavior is normal. Judgment and thought content normal.  Nursing note and vitals reviewed.   BP (!) 142/62 (BP Location: Right Arm, Patient Position: Sitting, Cuff Size: Normal)   Pulse (!) 58   Temp 97.8 F (36.6 C) (Oral)   Resp 16   Ht 5\' 8"  (1.727 m)   Wt 169 lb 6.4 oz (76.8 kg)   SpO2 97%   BMI 25.76 kg/m  Wt Readings from Last 3 Encounters:  12/22/16 169 lb 6.4 oz (76.8 kg)  12/21/15 166 lb 6.4 oz (75.5 kg)  07/02/15 176 lb (79.8 kg)     Lab Results  Component Value Date   WBC 5.8 12/21/2015   HGB 14.9 12/21/2015   HCT 44.9 12/21/2015   PLT 170.0 12/21/2015   GLUCOSE 83 12/21/2015   CHOL 115 12/21/2015   TRIG 73.0 12/21/2015   HDL 44.30 12/21/2015   LDLCALC 56 12/21/2015   ALT 19 12/21/2015   AST 24 12/21/2015   NA 139 12/21/2015   K 4.2 12/21/2015   CL 102 12/21/2015   CREATININE 1.19 12/21/2015   BUN 21 12/21/2015   CO2 29 12/21/2015   PSA 0.35 12/27/2009   INR 1.00 11/28/2010   HGBA1C 5.0 05/21/2012    No results found for: TSH Lab Results  Component Value Date   WBC 5.8 12/21/2015   HGB 14.9 12/21/2015   HCT 44.9 12/21/2015   MCV 96.4 12/21/2015   PLT 170.0 12/21/2015   Lab Results  Component Value Date   NA 139 12/21/2015   K 4.2 12/21/2015   CO2 29 12/21/2015   GLUCOSE 83 12/21/2015   BUN 21 12/21/2015   CREATININE 1.19 12/21/2015   BILITOT 0.9 12/21/2015   ALKPHOS 31 (L) 12/21/2015   AST 24 12/21/2015     ALT 19 12/21/2015   PROT 6.6 12/21/2015   ALBUMIN 4.3 12/21/2015   CALCIUM 9.8 12/21/2015   GFR 61.68 12/21/2015   Lab Results  Component Value Date   CHOL 115 12/21/2015   Lab Results  Component Value Date   HDL 44.30 12/21/2015   Lab Results  Component Value Date   LDLCALC 56 12/21/2015   Lab Results  Component Value Date  TRIG 73.0 12/21/2015   Lab Results  Component Value Date   CHOLHDL 3 12/21/2015   Lab Results  Component Value Date   HGBA1C 5.0 05/21/2012       Assessment & Plan:   Problem List Items Addressed This Visit      Unprioritized   Essential hypertension   Relevant Medications   doxazosin (CARDURA) 4 MG tablet   fenofibrate 160 MG tablet   metoprolol (TOPROL-XL) 200 MG 24 hr tablet   simvastatin (ZOCOR) 20 MG tablet   lisinopril (PRINIVIL,ZESTRIL) 5 MG tablet   Other Relevant Orders   Comprehensive metabolic panel   Hyperlipidemia    Check labs  con't meds      Relevant Medications   doxazosin (CARDURA) 4 MG tablet   fenofibrate 160 MG tablet   metoprolol (TOPROL-XL) 200 MG 24 hr tablet   simvastatin (ZOCOR) 20 MG tablet   lisinopril (PRINIVIL,ZESTRIL) 5 MG tablet   Other Relevant Orders   Lipid panel   CBC with Differential/Platelet    Other Visit Diagnoses    Generalized anxiety disorder    -  Primary   Relevant Medications   clorazepate (TRANXENE) 15 MG tablet      I have discontinued Mr. Alamillo's ALPRAZolam. I have also changed his clorazepate, doxazosin, and fenofibrate. Additionally, I am having him start on lisinopril. Lastly, I am having him maintain his aspirin, b complex vitamins, Calcium Carbonate-Vitamin D, fish oil-omega-3 fatty acids, cromolyn, Multiple Vitamins-Minerals (PRESERVISION AREDS 2 PO), metoprolol, and simvastatin.  Meds ordered this encounter  Medications  . clorazepate (TRANXENE) 15 MG tablet    Sig: Take 1/2 tablet by mouth once daily    Dispense:  45 tablet    Refill:  1  . doxazosin (CARDURA)  4 MG tablet    Sig: Take 1 tablet (4 mg total) by mouth at bedtime.    Dispense:  90 tablet    Refill:  1  . fenofibrate 160 MG tablet    Sig: Take 1 tablet (160 mg total) by mouth daily.    Dispense:  90 tablet    Refill:  1  . metoprolol (TOPROL-XL) 200 MG 24 hr tablet    Sig: Take 1/2 tablet by mouth once daily    Dispense:  90 tablet    Refill:  1  . simvastatin (ZOCOR) 20 MG tablet    Sig: TAKE 1 TABLET (20 MG TOTAL) BY MOUTH AT BEDTIME.    Dispense:  90 tablet    Refill:  0  . lisinopril (PRINIVIL,ZESTRIL) 5 MG tablet    Sig: Take 1 tablet (5 mg total) by mouth daily.    Dispense:  30 tablet    Refill:  2     [name], DO

## 2016-12-22 NOTE — Assessment & Plan Note (Signed)
Check labs con't meds 

## 2016-12-22 NOTE — Progress Notes (Signed)
reviewed

## 2017-01-12 ENCOUNTER — Telehealth: Payer: Self-pay

## 2017-01-12 ENCOUNTER — Other Ambulatory Visit: Payer: Self-pay | Admitting: Family Medicine

## 2017-01-12 DIAGNOSIS — M810 Age-related osteoporosis without current pathological fracture: Secondary | ICD-10-CM

## 2017-01-12 NOTE — Telephone Encounter (Signed)
Called pt to inform him of an upcoming appointment he should expect to receive a BMD, per provider, there was no answer x 2. LB

## 2017-01-15 ENCOUNTER — Ambulatory Visit (INDEPENDENT_AMBULATORY_CARE_PROVIDER_SITE_OTHER)
Admission: RE | Admit: 2017-01-15 | Discharge: 2017-01-15 | Disposition: A | Discharge status: Home / Self Care | Payer: Medicare Other | Source: Ambulatory Visit | Attending: Family Medicine | Admitting: Family Medicine

## 2017-01-15 DIAGNOSIS — M810 Age-related osteoporosis without current pathological fracture: Secondary | ICD-10-CM | POA: Diagnosis not present

## 2017-02-12 ENCOUNTER — Telehealth: Payer: Self-pay | Admitting: Family Medicine

## 2017-02-12 NOTE — Telephone Encounter (Signed)
Called patient with results he voiced his understanding.  Letter sent as well with results.  PC

## 2017-02-12 NOTE — Telephone Encounter (Signed)
Pt called in for his bone density results.   CB: [phone]

## 2017-03-09 ENCOUNTER — Other Ambulatory Visit: Payer: Self-pay | Admitting: Family Medicine

## 2017-03-09 DIAGNOSIS — I10 Essential (primary) hypertension: Secondary | ICD-10-CM

## 2017-03-14 ENCOUNTER — Other Ambulatory Visit: Payer: Self-pay | Admitting: Family Medicine

## 2017-03-14 DIAGNOSIS — I10 Essential (primary) hypertension: Secondary | ICD-10-CM

## 2017-04-21 ENCOUNTER — Other Ambulatory Visit: Payer: Self-pay | Admitting: Family Medicine

## 2017-04-25 ENCOUNTER — Encounter: Payer: Self-pay | Admitting: Family Medicine

## 2017-05-08 ENCOUNTER — Other Ambulatory Visit: Payer: Self-pay | Admitting: Family Medicine

## 2017-05-08 DIAGNOSIS — I10 Essential (primary) hypertension: Secondary | ICD-10-CM

## 2017-05-08 MED ORDER — LISINOPRIL 5 MG PO TABS: 5.0000 mg | ORAL_TABLET | Freq: Every day | ORAL | 0 refills | Status: DC

## 2017-05-22 DIAGNOSIS — H52203 Unspecified astigmatism, bilateral: Secondary | ICD-10-CM | POA: Diagnosis not present

## 2017-05-22 DIAGNOSIS — H353132 Nonexudative age-related macular degeneration, bilateral, intermediate dry stage: Secondary | ICD-10-CM | POA: Diagnosis not present

## 2017-05-22 DIAGNOSIS — Z961 Presence of intraocular lens: Secondary | ICD-10-CM | POA: Diagnosis not present

## 2017-05-24 DIAGNOSIS — C61 Malignant neoplasm of prostate: Secondary | ICD-10-CM | POA: Diagnosis not present

## 2017-05-31 DIAGNOSIS — C61 Malignant neoplasm of prostate: Secondary | ICD-10-CM | POA: Diagnosis not present

## 2017-05-31 DIAGNOSIS — R351 Nocturia: Secondary | ICD-10-CM | POA: Diagnosis not present

## 2017-06-07 ENCOUNTER — Other Ambulatory Visit: Payer: Self-pay | Admitting: Family Medicine

## 2017-06-11 DIAGNOSIS — L814 Other melanin hyperpigmentation: Secondary | ICD-10-CM | POA: Diagnosis not present

## 2017-06-11 DIAGNOSIS — D1801 Hemangioma of skin and subcutaneous tissue: Secondary | ICD-10-CM | POA: Diagnosis not present

## 2017-06-11 DIAGNOSIS — L821 Other seborrheic keratosis: Secondary | ICD-10-CM | POA: Diagnosis not present

## 2017-06-11 DIAGNOSIS — D225 Melanocytic nevi of trunk: Secondary | ICD-10-CM | POA: Diagnosis not present

## 2017-06-11 DIAGNOSIS — L570 Actinic keratosis: Secondary | ICD-10-CM | POA: Diagnosis not present

## 2017-06-22 ENCOUNTER — Other Ambulatory Visit: Payer: Self-pay | Admitting: Family Medicine

## 2017-06-22 DIAGNOSIS — F411 Generalized anxiety disorder: Secondary | ICD-10-CM

## 2017-06-22 NOTE — Telephone Encounter (Signed)
Requesting: Tranxene Contract: No UDS: NO Last OV: 12/22/16 Last Refill:12/22/16  #  Please Advise

## 2017-06-25 ENCOUNTER — Ambulatory Visit: Payer: Medicare Other | Admitting: Family Medicine

## 2017-06-29 ENCOUNTER — Other Ambulatory Visit: Payer: Self-pay | Admitting: Family Medicine

## 2017-06-29 ENCOUNTER — Telehealth: Payer: Self-pay | Admitting: Medical

## 2017-06-29 DIAGNOSIS — F411 Generalized anxiety disorder: Secondary | ICD-10-CM

## 2017-06-29 MED ORDER — CLORAZEPATE DIPOTASSIUM 15 MG PO TABS: ORAL_TABLET | ORAL | 1 refills | Status: DC

## 2017-06-29 NOTE — Telephone Encounter (Signed)
Opened for review. 

## 2017-06-29 NOTE — Telephone Encounter (Signed)
I saw this refill request after hours. Refilled but only gave one month supply with one refill. Can't dispense more than 30 day supply. But can give refills.

## 2017-06-29 NOTE — Telephone Encounter (Signed)
[place] request refill for clorazepate  Last filled per pharmacy: 03/25/17  Last written: 12/22/16 #45 1RF  Last ov:  12/22/16 Next ov: 07/05/17 Contract:  Not on file UDS: Not on file

## 2017-07-02 ENCOUNTER — Other Ambulatory Visit: Payer: Self-pay | Admitting: Family Medicine

## 2017-07-02 DIAGNOSIS — F411 Generalized anxiety disorder: Secondary | ICD-10-CM

## 2017-07-03 NOTE — Telephone Encounter (Signed)
Rx sent to pharmacy   

## 2017-07-05 ENCOUNTER — Ambulatory Visit (INDEPENDENT_AMBULATORY_CARE_PROVIDER_SITE_OTHER): Payer: Medicare Other | Admitting: Family Medicine

## 2017-07-05 ENCOUNTER — Encounter: Payer: Self-pay | Admitting: Family Medicine

## 2017-07-05 VITALS — BP 150/70 | HR 60 | Temp 97.9°F | Resp 16 | Ht 68.0 in | Wt 166.6 lb

## 2017-07-05 DIAGNOSIS — E785 Hyperlipidemia, unspecified: Secondary | ICD-10-CM

## 2017-07-05 DIAGNOSIS — I10 Essential (primary) hypertension: Secondary | ICD-10-CM | POA: Diagnosis not present

## 2017-07-05 LAB — COMPREHENSIVE METABOLIC PANEL
ALBUMIN: 4.1 g/dL (ref 3.5–5.2)
ALT: 19 U/L (ref 0–53)
AST: 25 U/L (ref 0–37)
Alkaline Phosphatase: 25 U/L — ABNORMAL LOW (ref 39–117)
BILIRUBIN TOTAL: 0.8 mg/dL (ref 0.2–1.2)
BUN: 22 mg/dL (ref 6–23)
CALCIUM: 9.5 mg/dL (ref 8.4–10.5)
CHLORIDE: 104 meq/L (ref 96–112)
CO2: 29 mEq/L (ref 19–32)
CREATININE: 1.13 mg/dL (ref 0.40–1.50)
GFR: 65.24 mL/min (ref >60.00)
Glucose, Bld: 93 mg/dL (ref 70–99)
Potassium: 3.9 mEq/L (ref 3.5–5.1)
Sodium: 139 mEq/L (ref 135–145)
Total Protein: 6.5 g/dL (ref 6.0–8.3)

## 2017-07-05 LAB — LIPID PANEL
CHOLESTEROL: 102 mg/dL (ref 0–200)
HDL: 49.8 mg/dL (ref >39.00)
LDL Cholesterol: 43 mg/dL (ref 0–99)
NonHDL: 52.6
TRIGLYCERIDES: 48 mg/dL (ref 0.0–149.0)
Total CHOL/HDL Ratio: 2
VLDL: 9.6 mg/dL (ref 0.0–40.0)

## 2017-07-05 MED ORDER — LISINOPRIL 10 MG PO TABS: 10.0000 mg | ORAL_TABLET | Freq: Every day | ORAL | 1 refills | Status: DC

## 2017-07-05 MED ORDER — PRESERVISION AREDS 2 PO CAPS: ORAL_CAPSULE | ORAL | Status: DC

## 2017-07-05 NOTE — Patient Instructions (Signed)

## 2017-07-05 NOTE — Progress Notes (Signed)
Patient ID: Mark Trefz., male   DOB: 09-23-1928, 83 y.o.   MRN: [id number]    Subjective:  I acted as a Education administrator for Dr. [name].  Guerry Bruin, [place]   Patient ID: Mark Stark., male    DOB: 05/24/1928, 83 y.o.   MRN: [id number]  Chief Complaint  Patient presents with  . Hypertension  . Hyperlipidemia  . Anxiety    HPI  Patient is in today for follow up blood pressure, cholesterol and anxiety.  He has been doing well on current treatment with no side effect.  He brought in some readings of blood pressures and he states they have been running a little high.   Patient Care Team: [name], [name], DO as PCP - General Gerarda Fraction, MD as Referring Physician (Ophthalmology) Alyson Ingles Candee Furbish, MD as Consulting Physician (Urology) Druscilla Brownie, MD as Consulting Physician (Dermatology)   Past Medical History:  Diagnosis Date  . Anxiety   . Diverticulitis   . Dyslipidemia   . Hypertension   . 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 Prostate cancer [facility])     Past Surgical History:  Procedure Laterality Date  . CATARACT EXTRACTION  01/27/2010   right  . CATARACT EXTRACTION  03/04/2010   left  . Prostate seed implants    . PROSTATE SURGERY      Family History  Problem Relation Age of Onset  . Dementia Mother   . Alzheimer's disease Mother   . Mental illness Mother        ALZ DISEASE  . Cancer Brother 51       pancreatic cancer    Social History   Social History  . Marital status: Married    Spouse name: N/A  . Number of children: N/A  . Years of education: N/A   Occupational History  . Professor---RETIRED Retired    Retired   Social History Main Topics  . Smoking status: Never Smoker  . Smokeless tobacco: Never Used  . Alcohol use No     Comment: Little wine occasionally   . Drug use: No  . Sexual activity: No   Other Topics Concern  . Not on file   Social History Narrative  . No narrative on file    Outpatient Medications Prior  to Visit  Medication Sig Dispense Refill  . aspirin 81 MG tablet Take 81 mg by mouth daily.      [name] b complex vitamins tablet Take 1 tablet by mouth daily.      . Calcium Carbonate-Vitamin D (CALTRATE 600+D) 600-400 MG-UNIT per tablet Take 1 tablet by mouth daily.      . clorazepate (TRANXENE) 15 MG tablet Take 1/2 tablet by mouth once daily 15 tablet 1  . cromolyn (NASALCROM) 5.2 MG/ACT nasal spray 2 sprays by Nasal route 2 (two) times daily.      [name] doxazosin (CARDURA) 4 MG tablet Take 1 tablet (4 mg total) by mouth at bedtime. 90 tablet 1  . fish oil-omega-3 fatty acids 1000 MG capsule Take 4 capsules by mouth daily.     . metoprolol (TOPROL-XL) 200 MG 24 hr tablet Take 1/2 tablet by mouth once daily 90 tablet 1  . Multiple Vitamins-Minerals (PRESERVISION AREDS 2 PO) Take 1 capsule by mouth daily.    . simvastatin (ZOCOR) 20 MG tablet TAKE 1 TABLET (20 MG TOTAL) BY MOUTH AT BEDTIME. 90 tablet 0  . lisinopril (PRINIVIL,ZESTRIL) 5 MG tablet Take 1 tablet (5 mg total)  by mouth daily. 90 tablet 0  . doxazosin (CARDURA) 4 MG tablet TAKE 1 TABLET BY MOUTH AT BEDTIME (FILLABLE 09/22/16) 90 tablet 1  . metoprolol (TOPROL-XL) 200 MG 24 hr tablet TAKE 1/2 A TABLET BY MOUTH EVERY DAY 90 tablet 1   No facility-administered medications prior to visit.     Allergies  Allergen Reactions  . Sulfonamide Derivatives     Review of Systems  Constitutional: Negative for fever and malaise/fatigue.  HENT: Negative for congestion.   Eyes: Negative for blurred vision.  Respiratory: Negative for cough and shortness of breath.   Cardiovascular: Negative for chest pain, palpitations and leg swelling.  Gastrointestinal: Negative for vomiting.  Musculoskeletal: Negative for back pain.  Skin: Negative for rash.  Neurological: Negative for loss of consciousness and headaches.  Psychiatric/Behavioral: The patient is nervous/anxious.        Objective:    Physical Exam  Constitutional: He is oriented to  person, place, and time. Vital signs are normal. He appears well-developed and well-nourished. He is sleeping. No distress.  HENT:  Head: Normocephalic and atraumatic.  Mouth/Throat: Oropharynx is clear and moist.  Eyes: Pupils are equal, round, and reactive to light. Conjunctivae and EOM are normal.  Neck: Normal range of motion. Neck supple. No thyromegaly present.  Cardiovascular: Normal rate and regular rhythm.   No murmur heard. Pulmonary/Chest: Effort normal and breath sounds normal. No respiratory distress. He has no wheezes. He has no rales. He exhibits no tenderness.  Abdominal: Soft. Bowel sounds are normal. There is no tenderness.  Musculoskeletal: Normal range of motion. He exhibits no edema, tenderness or deformity.  Neurological: He is alert and oriented to person, place, and time.  Skin: Skin is warm and dry. He is not diaphoretic.  Psychiatric: He has a normal mood and affect. His behavior is normal. Judgment and thought content normal.  Nursing note and vitals reviewed.   BP (!) 150/70   Pulse 60   Temp 97.9 F (36.6 C) (Oral)   Resp 16   Ht 5\' 8"  (1.727 m)   Wt 166 lb 9.6 oz (75.6 kg)   SpO2 98%   BMI 25.33 kg/m  Wt Readings from Last 3 Encounters:  07/05/17 166 lb 9.6 oz (75.6 kg)  12/22/16 169 lb 6.4 oz (76.8 kg)  12/21/15 166 lb 6.4 oz (75.5 kg)   BP Readings from Last 3 Encounters:  07/05/17 (!) 150/70  12/22/16 (!) 142/62  12/21/15 (!) 148/82     Immunization History  Administered Date(s) Administered  . Influenza Whole 11/14/2007, 09/30/2008, 09/08/2009, 08/31/2010, 09/03/2013  . Influenza, High Dose Seasonal PF 08/19/2015  . Influenza-Unspecified 09/07/2014, 09/15/2016  . Pneumococcal Conjugate-13 09/07/2014  . Pneumococcal Polysaccharide-23 09/22/2009  . Tdap 12/25/2012  . Zoster 03/19/2008    Health Maintenance  Topic Date Due  . INFLUENZA VACCINE  07/25/2017  . TETANUS/TDAP  12/25/2022  . PNA vac Low Risk Adult  Completed    Lab  Results  Component Value Date   WBC 7.7 12/22/2016   HGB 14.1 12/22/2016   HCT 41.2 12/22/2016   PLT 208.0 12/22/2016   GLUCOSE 93 07/05/2017   CHOL 102 07/05/2017   TRIG 48.0 07/05/2017   HDL 49.80 07/05/2017   LDLCALC 43 07/05/2017   ALT 19 07/05/2017   AST 25 07/05/2017   NA 139 07/05/2017   K 3.9 07/05/2017   CL 104 07/05/2017   CREATININE 1.13 07/05/2017   BUN 22 07/05/2017   CO2 29 07/05/2017   PSA 0.35  12/27/2009   INR 1.00 11/28/2010   HGBA1C 5.0 05/21/2012    No results found for: TSH Lab Results  Component Value Date   WBC 7.7 12/22/2016   HGB 14.1 12/22/2016   HCT 41.2 12/22/2016   MCV 95.1 12/22/2016   PLT 208.0 12/22/2016   Lab Results  Component Value Date   NA 139 07/05/2017   K 3.9 07/05/2017   CO2 29 07/05/2017   GLUCOSE 93 07/05/2017   BUN 22 07/05/2017   CREATININE 1.13 07/05/2017   BILITOT 0.8 07/05/2017   ALKPHOS 25 (L) 07/05/2017   AST 25 07/05/2017   ALT 19 07/05/2017   PROT 6.5 07/05/2017   ALBUMIN 4.1 07/05/2017   CALCIUM 9.5 07/05/2017   GFR 65.24 07/05/2017   Lab Results  Component Value Date   CHOL 102 07/05/2017   Lab Results  Component Value Date   HDL 49.80 07/05/2017   Lab Results  Component Value Date   LDLCALC 43 07/05/2017   Lab Results  Component Value Date   TRIG 48.0 07/05/2017   Lab Results  Component Value Date   CHOLHDL 2 07/05/2017   Lab Results  Component Value Date   HGBA1C 5.0 05/21/2012         Assessment & Plan:   Problem List Items Addressed This Visit      Unprioritized   Essential hypertension - Primary    Well controlled, no changes to meds. Encouraged heart healthy diet such as the DASH diet and exercise as tolerated.       Relevant Medications   fenofibrate 160 MG tablet   lisinopril (PRINIVIL,ZESTRIL) 10 MG tablet   Other Relevant Orders   Comprehensive metabolic panel (Completed)   Hyperlipidemia    Encouraged heart healthy diet, increase exercise, avoid trans fats,  consider a krill oil cap daily      Relevant Medications   fenofibrate 160 MG tablet   lisinopril (PRINIVIL,ZESTRIL) 10 MG tablet    Other Visit Diagnoses    Hyperlipidemia LDL goal <100       Relevant Medications   fenofibrate 160 MG tablet   lisinopril (PRINIVIL,ZESTRIL) 10 MG tablet   Other Relevant Orders   Comprehensive metabolic panel (Completed)   Lipid panel (Completed)      I have discontinued Mr. Satz's lisinopril. I am also having him start on lisinopril and PRESERVISION AREDS 2. Additionally, I am having him maintain his aspirin, b complex vitamins, Calcium Carbonate-Vitamin D, fish oil-omega-3 fatty acids, cromolyn, Multiple Vitamins-Minerals (PRESERVISION AREDS 2 PO), doxazosin, metoprolol, simvastatin, clorazepate, and fenofibrate.  Meds ordered this encounter  Medications  . fenofibrate 160 MG tablet    Sig: Take 160 mg by mouth daily.    Refill:  1  . lisinopril (PRINIVIL,ZESTRIL) 10 MG tablet    Sig: Take 1 tablet (10 mg total) by mouth daily.    Dispense:  30 tablet    Refill:  1  . Multiple Vitamins-Minerals (PRESERVISION AREDS 2) CAPS    Sig: 2 po qd   CMA served as scribe during this visit. History, Physical and Plan performed by medical provider. Documentation and orders reviewed and attested to.  [name], DO

## 2017-07-08 NOTE — Assessment & Plan Note (Signed)
Well controlled, no changes to meds. Encouraged heart healthy diet such as the DASH diet and exercise as tolerated.  °

## 2017-07-08 NOTE — Assessment & Plan Note (Signed)
Encouraged heart healthy diet, increase exercise, avoid trans fats, consider a krill oil cap daily 

## 2017-07-13 ENCOUNTER — Other Ambulatory Visit: Payer: Self-pay | Admitting: Family Medicine

## 2017-07-13 DIAGNOSIS — E785 Hyperlipidemia, unspecified: Secondary | ICD-10-CM

## 2017-07-19 ENCOUNTER — Other Ambulatory Visit: Payer: Self-pay

## 2017-07-19 DIAGNOSIS — I10 Essential (primary) hypertension: Secondary | ICD-10-CM

## 2017-07-19 MED ORDER — LISINOPRIL 10 MG PO TABS: 10.0000 mg | ORAL_TABLET | Freq: Every day | ORAL | 1 refills | Status: DC

## 2017-08-08 ENCOUNTER — Other Ambulatory Visit: Payer: Self-pay | Admitting: Family Medicine

## 2017-08-08 DIAGNOSIS — I10 Essential (primary) hypertension: Secondary | ICD-10-CM

## 2017-09-04 ENCOUNTER — Other Ambulatory Visit: Payer: Self-pay | Admitting: Family Medicine

## 2017-09-05 ENCOUNTER — Other Ambulatory Visit: Payer: Self-pay | Admitting: Family Medicine

## 2017-09-07 DIAGNOSIS — Z23 Encounter for immunization: Secondary | ICD-10-CM | POA: Diagnosis not present

## 2017-09-10 ENCOUNTER — Other Ambulatory Visit: Payer: Self-pay | Admitting: Medical

## 2017-09-10 DIAGNOSIS — F411 Generalized anxiety disorder: Secondary | ICD-10-CM

## 2017-09-13 NOTE — Telephone Encounter (Signed)
Pharmacy called in to check the status of a pt's refill request for clorazepate   Pharmacy:  [id number] - [place], [place]

## 2017-09-16 ENCOUNTER — Other Ambulatory Visit: Payer: Self-pay | Admitting: Family Medicine

## 2017-09-16 DIAGNOSIS — F411 Generalized anxiety disorder: Secondary | ICD-10-CM

## 2017-09-17 ENCOUNTER — Other Ambulatory Visit: Payer: Self-pay | Admitting: *Deleted

## 2017-09-17 ENCOUNTER — Telehealth: Payer: Self-pay | Admitting: Family Medicine

## 2017-09-17 DIAGNOSIS — F411 Generalized anxiety disorder: Secondary | ICD-10-CM

## 2017-09-17 MED ORDER — CLORAZEPATE DIPOTASSIUM 15 MG PO TABS: ORAL_TABLET | ORAL | 1 refills | Status: DC

## 2017-09-17 NOTE — Telephone Encounter (Signed)
°  Relation to WT:UUEK Call back [phone] Pharmacy: [id number] - [place], [place] (Phone) [phone] (Fax)     Reason for call:  Patient checking on the status of clorazepate (TRANXENE) 15 MG tablet request, patient states pharmacy sent several request last week,please advise

## 2017-09-24 NOTE — Telephone Encounter (Signed)
Refaxed to pharm/thx dmf

## 2017-10-21 ENCOUNTER — Other Ambulatory Visit: Payer: Self-pay | Admitting: Family Medicine

## 2017-10-24 NOTE — Telephone Encounter (Signed)
[facility]

## 2017-10-24 NOTE — Telephone Encounter (Signed)
Cardura refilled, verbal given to pharmacy

## 2017-11-09 ENCOUNTER — Telehealth: Payer: Self-pay | Admitting: Family Medicine

## 2017-11-09 NOTE — Telephone Encounter (Signed)
Copied from [place] [id number]. Topic: Quick Communication - See Telephone Encounter >> Nov 09, 2017  2:40 PM Rosalin Hawking wrote: CRM for notification. See Telephone encounter for:  11/09/17.   Pt dropped off document to be filled out by provider (Proof of Wellness visit in 2018- [employer] Envelope) Pt would like document to be mailed back to him when done. Document put at front office tray.

## 2017-11-14 NOTE — Telephone Encounter (Signed)
Wellness Program Participation Form Letter with hand written patient letter and SAS envelope requesting provider to sign off on form stating that patient has had a Wellness Exam with labs in 2018 [letter states 07/13/17, but this was a follow-up]; forwarded to provider that last AWV with lab panels was on 12/22/16//SLS 11/21

## 2017-11-29 DIAGNOSIS — H353132 Nonexudative age-related macular degeneration, bilateral, intermediate dry stage: Secondary | ICD-10-CM | POA: Diagnosis not present

## 2017-11-29 DIAGNOSIS — H35373 Puckering of macula, bilateral: Secondary | ICD-10-CM | POA: Diagnosis not present

## 2017-11-29 DIAGNOSIS — Z961 Presence of intraocular lens: Secondary | ICD-10-CM | POA: Diagnosis not present

## 2017-11-29 DIAGNOSIS — H35351 Cystoid macular degeneration, right eye: Secondary | ICD-10-CM | POA: Insufficient documentation

## 2017-11-29 DIAGNOSIS — H43813 Vitreous degeneration, bilateral: Secondary | ICD-10-CM | POA: Diagnosis not present

## 2017-11-30 DIAGNOSIS — C61 Malignant neoplasm of prostate: Secondary | ICD-10-CM | POA: Diagnosis not present

## 2017-12-05 ENCOUNTER — Other Ambulatory Visit: Payer: Self-pay | Admitting: Family Medicine

## 2017-12-07 DIAGNOSIS — R351 Nocturia: Secondary | ICD-10-CM | POA: Diagnosis not present

## 2017-12-07 DIAGNOSIS — C61 Malignant neoplasm of prostate: Secondary | ICD-10-CM | POA: Diagnosis not present

## 2017-12-10 ENCOUNTER — Other Ambulatory Visit: Payer: Self-pay | Admitting: *Deleted

## 2017-12-10 DIAGNOSIS — I10 Essential (primary) hypertension: Secondary | ICD-10-CM

## 2017-12-10 MED ORDER — LISINOPRIL 10 MG PO TABS: 10.0000 mg | ORAL_TABLET | Freq: Every day | ORAL | 1 refills | Status: DC

## 2018-01-11 ENCOUNTER — Ambulatory Visit (INDEPENDENT_AMBULATORY_CARE_PROVIDER_SITE_OTHER): Payer: Medicare Other | Admitting: Family Medicine

## 2018-01-11 ENCOUNTER — Encounter: Payer: Self-pay | Admitting: Family Medicine

## 2018-01-11 VITALS — BP 120/70 | HR 69 | Temp 97.4°F | Resp 16 | Ht 68.0 in | Wt 168.8 lb

## 2018-01-11 DIAGNOSIS — I480 Paroxysmal atrial fibrillation: Secondary | ICD-10-CM | POA: Diagnosis not present

## 2018-01-11 DIAGNOSIS — I499 Cardiac arrhythmia, unspecified: Secondary | ICD-10-CM

## 2018-01-11 DIAGNOSIS — E785 Hyperlipidemia, unspecified: Secondary | ICD-10-CM

## 2018-01-11 MED ORDER — APIXABAN 5 MG PO TABS: 5.0000 mg | ORAL_TABLET | Freq: Two times a day (BID) | ORAL | 2 refills | Status: DC

## 2018-01-11 NOTE — Progress Notes (Signed)
Patient ID: Lotus Gover., male   DOB: 1928/02/20, 84 y.o.   MRN: [id number]    Subjective:  I acted as a Education administrator for Dr. [name].  Guerry Bruin, [place]   Patient ID: Bartley Vuolo., male    DOB: 1928-08-31, 84 y.o.   MRN: [id number]  Chief Complaint  Patient presents with  . Hypertension  . Hyperlipidemia    HPI  Patient is in today for follow up blood pressure and cholesterol.  He c/o of trouble catching his breath and an irregular heart beat.  No chest pain.  Pt states on 12/24 he had what he thinks was a panic attack and his heart started beating irregularly then  Patient Care Team: [name], [name], DO as PCP - General Gerarda Fraction, MD as Referring Physician (Ophthalmology) Alyson Ingles Candee Furbish, MD as Consulting Physician (Urology) Druscilla Brownie, MD as Consulting Physician (Dermatology)   Past Medical History:  Diagnosis Date  . Anxiety   . Diverticulitis   . Dyslipidemia   . Hypertension   . 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 Prostate cancer [facility])     Past Surgical History:  Procedure Laterality Date  . CATARACT EXTRACTION  01/27/2010   right  . CATARACT EXTRACTION  03/04/2010   left  . Prostate seed implants    . PROSTATE SURGERY      Family History  Problem Relation Age of Onset  . Dementia Mother   . Alzheimer's disease Mother   . Mental illness Mother        ALZ DISEASE  . Cancer Brother 57       pancreatic cancer    Social History   Socioeconomic History  . Marital status: Married    Spouse name: Not on file  . Number of children: Not on file  . Years of education: Not on file  . Highest education level: Not on file  Social Needs  . Financial resource strain: Not on file  . Food insecurity - worry: Not on file  . Food insecurity - inability: Not on file  . Transportation needs - medical: Not on file  . Transportation needs - non-medical: Not on file  Occupational History  . Occupation: Professor---RETIRED    Employer:  RETIRED    Comment: Retired  Tobacco Use  . Smoking status: Never Smoker  . Smokeless tobacco: Never Used  Substance and Sexual Activity  . Alcohol use: No    Comment: Little wine occasionally   . Drug use: No  . Sexual activity: No    Partners: Female  Other Topics Concern  . Not on file  Social History Narrative  . Not on file    Outpatient Medications Prior to Visit  Medication Sig Dispense Refill  . aspirin 81 MG tablet Take 81 mg by mouth daily.      [name] b complex vitamins tablet Take 1 tablet by mouth daily.      . Calcium Carbonate-Vitamin D (CALTRATE 600+D) 600-400 MG-UNIT per tablet Take 1 tablet by mouth daily.      . clorazepate (TRANXENE) 15 MG tablet Take 1/2 tablet by mouth once daily 15 tablet 1  . cromolyn (NASALCROM) 5.2 MG/ACT nasal spray 2 sprays by Nasal route 2 (two) times daily.      [name] doxazosin (CARDURA) 4 MG tablet Take 1 tablet (4 mg total) by mouth at bedtime. 90 tablet 1  . fenofibrate 160 MG tablet Take 160 mg by mouth daily.  1  .  fenofibrate 160 MG tablet TAKE 1 TABLET BY MOUTH EVERY DAY 90 tablet 1  . fish oil-omega-3 fatty acids 1000 MG capsule Take 4 capsules by mouth daily.     [name] lisinopril (PRINIVIL,ZESTRIL) 5 MG tablet TAKE 1 TABLET BY MOUTH EVERY DAY 90 tablet 0  . metoprolol (TOPROL-XL) 200 MG 24 hr tablet Take 1/2 tablet by mouth once daily 90 tablet 1  . Multiple Vitamins-Minerals (PRESERVISION AREDS 2 PO) Take 1 capsule by mouth daily.    . Multiple Vitamins-Minerals (PRESERVISION AREDS 2) CAPS 2 po qd    . simvastatin (ZOCOR) 20 MG tablet TAKE 1 TABLET BY MOUTH EVERYDAY AT BEDTIME 90 tablet 0  . lisinopril (PRINIVIL,ZESTRIL) 10 MG tablet Take 1 tablet (10 mg total) by mouth daily. 90 tablet 1   No facility-administered medications prior to visit.     Allergies  Allergen Reactions  . Sulfonamide Derivatives     Review of Systems  Constitutional: Negative for fever and malaise/fatigue.  HENT: Negative for congestion.   Eyes: Negative  for blurred vision.  Respiratory: Positive for shortness of breath. Negative for cough.   Cardiovascular: Negative for chest pain, palpitations and leg swelling.  Gastrointestinal: Negative for vomiting.  Musculoskeletal: Negative for back pain.  Skin: Negative for rash.  Neurological: Negative for loss of consciousness and headaches.       Objective:    Physical Exam  Constitutional: He is oriented to person, place, and time. Vital signs are normal. He appears well-developed and well-nourished. He is sleeping.  HENT:  Head: Normocephalic and atraumatic.  Mouth/Throat: Oropharynx is clear and moist.  Eyes: EOM are normal. Pupils are equal, round, and reactive to light.  Neck: Normal range of motion. Neck supple. No thyromegaly present.  Cardiovascular: An irregularly irregular rhythm present.  Murmur heard. Pulmonary/Chest: Effort normal and breath sounds normal. No respiratory distress. He has no wheezes. He has no rales. He exhibits no tenderness.  Musculoskeletal: He exhibits no edema or tenderness.  Neurological: He is alert and oriented to person, place, and time.  Skin: Skin is warm and dry.  Psychiatric: He has a normal mood and affect. His behavior is normal. Judgment and thought content normal.  Nursing note and vitals reviewed.   BP 120/70 (BP Location: Left Arm, Cuff Size: Normal)   Pulse 69   Temp (!) 97.4 F (36.3 C) (Oral)   Resp 16   Ht 5' 8"  (1.727 m)   Wt 168 lb 12.8 oz (76.6 kg)   SpO2 96%   BMI 25.67 kg/m  Wt Readings from Last 3 Encounters:  01/11/18 168 lb 12.8 oz (76.6 kg)  07/05/17 166 lb 9.6 oz (75.6 kg)  12/22/16 169 lb 6.4 oz (76.8 kg)   BP Readings from Last 3 Encounters:  01/11/18 120/70  07/05/17 (!) 150/70  12/22/16 (!) 142/62     Immunization History  Administered Date(s) Administered  . Influenza Whole 11/14/2007, 09/30/2008, 09/08/2009, 08/31/2010, 09/03/2013  . Influenza, High Dose Seasonal PF 08/19/2015  . Influenza-Unspecified  09/07/2014, 09/15/2016  . Pneumococcal Conjugate-13 09/07/2014  . Pneumococcal Polysaccharide-23 09/22/2009  . Tdap 12/25/2012  . Zoster 03/19/2008    Health Maintenance  Topic Date Due  . Samul Dada  12/25/2022  . INFLUENZA VACCINE  Completed  . PNA vac Low Risk Adult  Completed    Lab Results  Component Value Date   WBC 7.7 12/22/2016   HGB 14.1 12/22/2016   HCT 41.2 12/22/2016   PLT 208.0 12/22/2016   GLUCOSE 85 01/11/2018  CHOL 90 01/11/2018   TRIG 52 01/11/2018   HDL 45 01/11/2018   LDLCALC 43 07/05/2017   ALT 18 01/11/2018   AST 22 01/11/2018   NA 140 01/11/2018   K 4.5 01/11/2018   CL 103 01/11/2018   CREATININE 1.30 (H) 01/11/2018   BUN 20 01/11/2018   CO2 26 01/11/2018   PSA 0.35 12/27/2009   INR 1.00 11/28/2010   HGBA1C 5.0 05/21/2012    No results found for: TSH Lab Results  Component Value Date   WBC 7.7 12/22/2016   HGB 14.1 12/22/2016   HCT 41.2 12/22/2016   MCV 95.1 12/22/2016   PLT 208.0 12/22/2016   Lab Results  Component Value Date   NA 140 01/11/2018   K 4.5 01/11/2018   CO2 26 01/11/2018   GLUCOSE 85 01/11/2018   BUN 20 01/11/2018   CREATININE 1.30 (H) 01/11/2018   BILITOT 1.1 01/11/2018   ALKPHOS 25 (L) 07/05/2017   AST 22 01/11/2018   ALT 18 01/11/2018   PROT 6.4 01/11/2018   ALBUMIN 4.1 07/05/2017   CALCIUM 9.3 01/11/2018   GFR 65.24 07/05/2017   Lab Results  Component Value Date   CHOL 90 01/11/2018   Lab Results  Component Value Date   HDL 45 01/11/2018   Lab Results  Component Value Date   LDLCALC 43 07/05/2017   Lab Results  Component Value Date   TRIG 52 01/11/2018   Lab Results  Component Value Date   CHOLHDL 2.0 01/11/2018   Lab Results  Component Value Date   HGBA1C 5.0 05/21/2012         Assessment & Plan:   Problem List Items Addressed This Visit      Unprioritized   Paroxysmal atrial fibrillation ([place])    D/w with cardiology-- Dr Percival Spanish Event monitor and echo to be done Start  eliquis 5 mg bid F/u cardiology next week       Relevant Medications   apixaban (ELIQUIS) 5 MG TABS tablet   Other Relevant Orders   Ambulatory referral to Cardiology   Cardiac event monitor   ECHOCARDIOGRAM COMPLETE    Other Visit Diagnoses    Irregular heart beat    -  Primary   Relevant Orders   EKG 12-Lead (Completed)   Hyperlipidemia LDL goal <100       Relevant Medications   apixaban (ELIQUIS) 5 MG TABS tablet   Other Relevant Orders   Lipid panel (Completed)   Comp Met (CMET) (Completed)      I am having Debbie E. Lorin Glass. start on apixaban. I am also having him maintain his aspirin, b complex vitamins, Calcium Carbonate-Vitamin D, fish oil-omega-3 fatty acids, cromolyn, Multiple Vitamins-Minerals (PRESERVISION AREDS 2 PO), doxazosin, metoprolol, fenofibrate, PRESERVISION AREDS 2, lisinopril, fenofibrate, clorazepate, and simvastatin.  Meds ordered this encounter  Medications  . apixaban (ELIQUIS) 5 MG TABS tablet    Sig: Take 1 tablet (5 mg total) by mouth 2 (two) times daily.    Dispense:  60 tablet    Refill:  2    CMA served as scribe during this visit. History, Physical and Plan performed by medical provider. Documentation and orders reviewed and attested to.  [name], DO

## 2018-01-11 NOTE — Patient Instructions (Signed)
Atrial Fibrillation Atrial fibrillation is a type of irregular or rapid heartbeat (arrhythmia). In atrial fibrillation, the heart quivers continuously in a chaotic pattern. This occurs when parts of the heart receive disorganized signals that make the heart unable to pump blood normally. This can increase the risk for stroke, heart failure, and other heart-related conditions. There are different types of atrial fibrillation, including:  Paroxysmal atrial fibrillation. This type starts suddenly, and it usually stops on its own shortly after it starts.  Persistent atrial fibrillation. This type often lasts longer than a week. It may stop on its own or with treatment.  Long-lasting persistent atrial fibrillation. This type lasts longer than 12 months.  Permanent atrial fibrillation. This type does not go away.  Talk with your health care provider to learn about the type of atrial fibrillation that you have. What are the causes? This condition is caused by some heart-related conditions or procedures, including:  A heart attack.  Coronary artery disease.  Heart failure.  Heart valve conditions.  High blood pressure.  Inflammation of the sac that surrounds the heart (pericarditis).  Heart surgery.  Certain heart rhythm disorders, such as Wolf-Parkinson-White syndrome.  Other causes include:  Pneumonia.  Obstructive sleep apnea.  Blockage of an artery in the lungs (pulmonary embolism, or PE).  Lung cancer.  Chronic lung disease.  Thyroid problems, especially if the thyroid is overactive (hyperthyroidism).  Caffeine.  Excessive alcohol use or illegal drug use.  Use of some medicines, including certain decongestants and diet pills.  Sometimes, the cause cannot be found. What increases the risk? This condition is more likely to develop in:  People who are older in age.  People who smoke.  People who have diabetes mellitus.  People who are overweight  (obese).  Athletes who exercise vigorously.  What are the signs or symptoms? Symptoms of this condition include:  A feeling that your heart is beating rapidly or irregularly.  A feeling of discomfort or pain in your chest.  Shortness of breath.  Sudden light-headedness or weakness.  Getting tired easily during exercise.  In some cases, there are no symptoms. How is this diagnosed? Your health care provider may be able to detect atrial fibrillation when taking your pulse. If detected, this condition may be diagnosed with:  An electrocardiogram (ECG).  A Holter monitor test that records your heartbeat patterns over a 24-hour period.  Transthoracic echocardiogram (TTE) to evaluate how blood flows through your heart.  Transesophageal echocardiogram (TEE) to view more detailed images of your heart.  A stress test.  Imaging tests, such as a CT scan or chest X-ray.  Blood tests.  How is this treated? The main goals of treatment are to prevent blood clots from forming and to keep your heart beating at a normal rate and rhythm. The type of treatment that you receive depends on many factors, such as your underlying medical conditions and how you feel when you are experiencing atrial fibrillation. This condition may be treated with:  Medicine to slow down the heart rate, bring the heart's rhythm back to normal, or prevent clots from forming.  Electrical cardioversion. This is a procedure that resets your heart's rhythm by delivering a controlled, low-energy shock to the heart through your skin.  Different types of ablation, such as catheter ablation, catheter ablation with pacemaker, or surgical ablation. These procedures destroy the heart tissues that send abnormal signals. When the pacemaker is used, it is placed under your skin to help your heart beat in   a regular rhythm.  Follow these instructions at home:  Take over-the counter and prescription medicines only as told by your  health care provider.  If your health care provider prescribed a blood-thinning medicine (anticoagulant), take it exactly as told. Taking too much blood-thinning medicine can cause bleeding. If you do not take enough blood-thinning medicine, you will not have the protection that you need against stroke and other problems.  Do not use tobacco products, including cigarettes, chewing tobacco, and e-cigarettes. If you need help quitting, ask your health care provider.  If you have obstructive sleep apnea, manage your condition as told by your health care provider.  Do not drink alcohol.  Do not drink beverages that contain caffeine, such as coffee, soda, and tea.  Maintain a healthy weight. Do not use diet pills unless your health care provider approves. Diet pills may make heart problems worse.  Follow diet instructions as told by your health care provider.  Exercise regularly as told by your health care provider.  Keep all follow-up visits as told by your health care provider. This is important. How is this prevented?  Avoid drinking beverages that contain caffeine or alcohol.  Avoid certain medicines, especially medicines that are used for breathing problems.  Avoid certain herbs and herbal medicines, such as those that contain ephedra or ginseng.  Do not use illegal drugs, such as cocaine and amphetamines.  Do not smoke.  Manage your high blood pressure. Contact a health care provider if:  You notice a change in the rate, rhythm, or strength of your heartbeat.  You are taking an anticoagulant and you notice increased bruising.  You tire more easily when you exercise or exert yourself. Get help right away if:  You have chest pain, abdominal pain, sweating, or weakness.  You feel nauseous.  You notice blood in your vomit, bowel movement, or urine.  You have shortness of breath.  You suddenly have swollen feet and ankles.  You feel dizzy.  You have sudden weakness or  numbness of the face, arm, or leg, especially on one side of the body.  You have trouble speaking, trouble understanding, or both (aphasia).  Your face or your eyelid droops on one side. These symptoms may represent a serious problem that is an emergency. Do not wait to see if the symptoms will go away. Get medical help right away. Call your local emergency services (911 in the [place] Do not drive yourself to the hospital. This information is not intended to replace advice given to you by your health care provider. Make sure you discuss any questions you have with your health care provider. Document Released: 12/11/2005 Document Revised: 04/19/2016 Document Reviewed: 04/07/2015 Elsevier Interactive Patient Education  2018 Elsevier Inc.  

## 2018-01-12 ENCOUNTER — Encounter: Payer: Self-pay | Admitting: Family Medicine

## 2018-01-12 DIAGNOSIS — I480 Paroxysmal atrial fibrillation: Secondary | ICD-10-CM | POA: Insufficient documentation

## 2018-01-12 HISTORY — DX: Paroxysmal atrial fibrillation: I48.0

## 2018-01-12 LAB — COMPREHENSIVE METABOLIC PANEL
AG Ratio: 2.2 (calc) (ref 1.0–2.5)
ALBUMIN MSPROF: 4.4 g/dL (ref 3.6–5.1)
ALT: 18 U/L (ref 9–46)
AST: 22 U/L (ref 10–35)
Alkaline phosphatase (APISO): 28 U/L — ABNORMAL LOW (ref 40–115)
BILIRUBIN TOTAL: 1.1 mg/dL (ref 0.2–1.2)
BUN / CREAT RATIO: 15 (calc) (ref 6–22)
BUN: 20 mg/dL (ref 7–25)
CALCIUM: 9.3 mg/dL (ref 8.6–10.3)
CO2: 26 mmol/L (ref 20–32)
CREATININE: 1.3 mg/dL — AB (ref 0.70–1.11)
Chloride: 103 mmol/L (ref 98–110)
GLUCOSE: 85 mg/dL (ref 65–99)
Globulin: 2 g/dL (calc) (ref 1.9–3.7)
POTASSIUM: 4.5 mmol/L (ref 3.5–5.3)
SODIUM: 140 mmol/L (ref 135–146)
TOTAL PROTEIN: 6.4 g/dL (ref 6.1–8.1)

## 2018-01-12 LAB — LIPID PANEL
CHOL/HDL RATIO: 2 (calc) (ref <5.0)
CHOLESTEROL: 90 mg/dL (ref <200)
HDL: 45 mg/dL (ref >40)
LDL Cholesterol (Calc): 32 mg/dL (calc)
Non-HDL Cholesterol (Calc): 45 mg/dL (calc) (ref <130)
Triglycerides: 52 mg/dL (ref <150)

## 2018-01-12 NOTE — Assessment & Plan Note (Signed)
D/w with cardiology-- Dr Percival Spanish Event monitor and echo to be done Start eliquis 5 mg bid F/u cardiology next week

## 2018-01-15 ENCOUNTER — Telehealth: Payer: Self-pay | Admitting: *Deleted

## 2018-01-15 NOTE — Telephone Encounter (Signed)
-----   Message from Minus Breeding, MD sent at 01/11/2018  2:29 PM EST ----- Needs APP ----- Message ----- From: [name], DO Sent: 01/11/2018   1:55 PM To: Minus Breeding, MD  I'm seeing mr Rissmiller today.   Its been several years since you have  seen him.  He complained of an irregular heartbeat today and ekg looks like a fib.   We have actually been trying to get a hold of your office to talk to DOD and have been on hold for 64  Min---  I have him here in the office still --- he is asymptomatic except some difficulty catching his breath at times.   Our office is [phone] My cell is [phone]  If someone could call me I would appreciate it--  Thanks  Kendrick Fries

## 2018-01-15 NOTE — Telephone Encounter (Signed)
Spoke with pt Echo and Monitor was ordered by Dr Etter Sjogren for January 28th, appt made to see Dr Warren Lacy in February 12th @ 10:00 am

## 2018-01-17 ENCOUNTER — Other Ambulatory Visit (HOSPITAL_COMMUNITY): Payer: Medicare Other

## 2018-01-21 ENCOUNTER — Ambulatory Visit (HOSPITAL_COMMUNITY): Payer: Medicare Other | Attending: Cardiology

## 2018-01-21 ENCOUNTER — Other Ambulatory Visit: Payer: Self-pay

## 2018-01-21 ENCOUNTER — Ambulatory Visit (INDEPENDENT_AMBULATORY_CARE_PROVIDER_SITE_OTHER): Payer: Medicare Other

## 2018-01-21 DIAGNOSIS — I480 Paroxysmal atrial fibrillation: Secondary | ICD-10-CM | POA: Diagnosis not present

## 2018-01-21 DIAGNOSIS — I119 Hypertensive heart disease without heart failure: Secondary | ICD-10-CM | POA: Diagnosis not present

## 2018-01-21 DIAGNOSIS — I083 Combined rheumatic disorders of mitral, aortic and tricuspid valves: Secondary | ICD-10-CM | POA: Insufficient documentation

## 2018-01-21 DIAGNOSIS — E785 Hyperlipidemia, unspecified: Secondary | ICD-10-CM | POA: Diagnosis not present

## 2018-01-23 ENCOUNTER — Encounter: Payer: Self-pay | Admitting: Cardiovascular Disease

## 2018-01-24 ENCOUNTER — Encounter: Payer: Self-pay | Admitting: Cardiovascular Disease

## 2018-01-24 ENCOUNTER — Ambulatory Visit (INDEPENDENT_AMBULATORY_CARE_PROVIDER_SITE_OTHER): Payer: Medicare Other | Admitting: Cardiovascular Disease

## 2018-01-24 VITALS — BP 130/88 | HR 73 | Ht 68.0 in | Wt 170.0 lb

## 2018-01-24 DIAGNOSIS — I4819 Other persistent atrial fibrillation: Secondary | ICD-10-CM

## 2018-01-24 DIAGNOSIS — Z79899 Other long term (current) drug therapy: Secondary | ICD-10-CM

## 2018-01-24 DIAGNOSIS — I10 Essential (primary) hypertension: Secondary | ICD-10-CM | POA: Diagnosis not present

## 2018-01-24 DIAGNOSIS — E785 Hyperlipidemia, unspecified: Secondary | ICD-10-CM | POA: Diagnosis not present

## 2018-01-24 DIAGNOSIS — Z7901 Long term (current) use of anticoagulants: Secondary | ICD-10-CM

## 2018-01-24 DIAGNOSIS — I481 Persistent atrial fibrillation: Secondary | ICD-10-CM | POA: Diagnosis not present

## 2018-01-24 NOTE — Progress Notes (Signed)
Cardiology Office Note    Date:  01/27/2018   ID:  Mark Riles., DOB 11-Nov-1928, MRN [id number]  PCP:  [name], DO  Cardiologist:  Shelva Majestic, MD   New cardiology evaluation, referred through the courtesy of Dr. Roma Schanz for evaluation of an irregular rhythm in atrial fibrillation.  History of Present Illness:  Mark Lada. is a 84 y.o. male who is referred by Dr. [name] for atrial fibrillation.  Mark Stark is a young appearing 84 year old male who remotely had seen Dr. Percival Spanish on one occasion in 2012.  He was noted have a mild slight heart murmur.  Over the last several years he has continued to be active.  He typically walks for 1 hour every morning.  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  On Christmas Eve 2018, he developed a sensation of an irregular rhythm.  His heart rhythm has persisted.  He had seen Dr. Roma Schanz and was found to be in atrial fibrillation.  He was started on eliquis 5 mg twice a day for anticoagulation.  He has been taking metoprolol succinate 100 mg daily, lisinopril 5 mg, and oxytocin 4 mg for hypertension.  He is on simvastatin 20 mg daily for hyperlipidemia.  He is wearing a Holter monitor.  His initial strips confirm persistent atrial fibrillation.  A [facility] service read out from 01/24/2018 at 450:54 AM has revealed a 3.1 second pause while the patient was sleeping. [name]  He denies any chest pain.  He denies any presyncope.  He denies PND, orthopnea.  He does note more fatigue and slight shortness of breath.  He denies issues with sleep.  He is added onto my schedule for evaluation.   Past Medical History:  Diagnosis Date  . Anxiety   . Diverticulitis   . Dyslipidemia   . Hypertension   . 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 Prostate cancer [facility])     Past Surgical History:  Procedure Laterality Date  . CATARACT EXTRACTION  01/27/2010   right  . CATARACT EXTRACTION   03/04/2010   left  . Prostate seed implants    . PROSTATE SURGERY      Current Medications: Outpatient Medications Prior to Visit  Medication Sig Dispense Refill  . apixaban (ELIQUIS) 5 MG TABS tablet Take 1 tablet (5 mg total) by mouth 2 (two) times daily. 60 tablet 2  . b complex vitamins tablet Take 1 tablet by mouth daily.      . Calcium Carbonate-Vitamin D (CALTRATE 600+D) 600-400 MG-UNIT per tablet Take 1 tablet by mouth daily.      . clorazepate (TRANXENE) 15 MG tablet Take 1/2 tablet by mouth once daily 15 tablet 1  . cromolyn (NASALCROM) 5.2 MG/ACT nasal spray 2 sprays by Nasal route 2 (two) times daily.      [name] doxazosin (CARDURA) 4 MG tablet Take 1 tablet (4 mg total) by mouth at bedtime. 90 tablet 1  . fenofibrate 160 MG tablet TAKE 1 TABLET BY MOUTH EVERY DAY 90 tablet 1  . fish oil-omega-3 fatty acids 1000 MG capsule Take 4 capsules by mouth daily.     [name] lisinopril (PRINIVIL,ZESTRIL) 5 MG tablet TAKE 1 TABLET BY MOUTH EVERY DAY 90 tablet 0  . metoprolol (TOPROL-XL) 200 MG 24 hr tablet Take 1/2 tablet by mouth once daily 90 tablet 1  . Multiple Vitamins-Minerals (PRESERVISION AREDS 2 PO) Take 1 capsule by  mouth daily.    . Multiple Vitamins-Minerals (PRESERVISION AREDS 2) CAPS 2 po qd    . simvastatin (ZOCOR) 20 MG tablet TAKE 1 TABLET BY MOUTH EVERYDAY AT BEDTIME 90 tablet 0  . aspirin 81 MG tablet Take 81 mg by mouth daily.      . fenofibrate 160 MG tablet Take 160 mg by mouth daily.  1   No facility-administered medications prior to visit.      Allergies:   Sulfonamide derivatives   Social History   Socioeconomic History  . Marital status: Married    Spouse name: None  . Number of children: None  . Years of education: None  . Highest education level: None  Social Needs  . Financial resource strain: None  . Food insecurity - worry: None  . Food insecurity - inability: None  . Transportation needs - medical: None  . Transportation needs - non-medical: None    Occupational History  . Occupation: Professor---RETIRED    Employer: RETIRED    Comment: Retired  Tobacco Use  . Smoking status: Never Smoker  . Smokeless tobacco: Never Used  Substance and Sexual Activity  . Alcohol use: No    Comment: Little wine occasionally   . Drug use: No  . Sexual activity: No    Partners: Female  Other Topics Concern  . None  Social History Narrative  . None     Family History:  The patient's family history includes Alzheimer's disease in his mother; Cancer (age of onset: 7) in his brother; Dementia in his mother; Mental illness in his mother.  His father is deceased and had suffered a myocardial infarction and had 3 CVAs.  His mother died and had Alzheimer's disease.  ROS General: Negative; No fevers, chills, or night sweats;  HEENT: Negative; No changes in vision or hearing, sinus congestion, difficulty swallowing Pulmonary: Negative; No cough, wheezing, shortness of breath, hemoptysis Cardiovascular: See history of present illness GI: Negative; No nausea, vomiting, diarrhea, or abdominal pain GU: Negative; No dysuria, hematuria, or difficulty voiding Musculoskeletal: Negative; no myalgias, joint pain, or weakness Hematologic/Oncology: Negative; no easy bruising, bleeding Endocrine: Negative; no heat/cold intolerance; no diabetes Neuro: Negative; no changes in balance, headaches Skin: Negative; No rashes or skin lesions Psychiatric: Negative; No behavioral problems, depression Sleep: Negative; No snoring, daytime sleepiness, hypersomnolence, bruxism, restless legs, hypnogognic hallucinations, no cataplexy Other comprehensive 14 point system review is negative.   PHYSICAL EXAM:   VS:  BP 130/88   Pulse 73   Ht 5' 8"  (1.727 m)   Wt 170 lb (77.1 kg)   BMI 25.85 kg/m     Repeat blood pressure by me was 136/80   Wt Readings from Last 3 Encounters:  01/24/18 170 lb (77.1 kg)  01/11/18 168 lb 12.8 oz (76.6 kg)  07/05/17 166 lb 9.6 oz (75.6  kg)    General: Alert, oriented, no distress.  Skin: normal turgor, no rashes, warm and dry HEENT: Normocephalic, atraumatic. Pupils equal round and reactive to light; sclera anicteric; extraocular muscles intact; Fundi bilateral cataract lens implants Nose without nasal septal hypertrophy Mouth/Parynx benign; Mallinpatti scale 3 Neck: No JVD, no carotid bruits; normal carotid upstroke Lungs: clear to ausculatation and percussion; no wheezing or rales Chest wall: without tenderness to palpitation Heart: PMI not displaced, irregularly irregular with a controlled ventricular response in the 60-70s, s1 s2 normal, 1/6 systolic murmur, no diastolic murmur, no rubs, gallops, thrills, or heaves Abdomen: soft, nontender; no hepatosplenomehaly, BS+; abdominal aorta nontender and not  dilated by palpation. Back: no CVA tenderness Pulses 2+ Musculoskeletal: full range of motion, normal strength, no joint deformities Extremities: no clubbing cyanosis or edema, Homan's sign negative  Neurologic: grossly nonfocal; Cranial nerves grossly wnl Psychologic: Normal mood and affect   Studies/Labs Reviewed:   EKG:  EKG is ordered today.  ECG (independently read by me): atrial fibrillation at 73 bpm. QTc interval 387 ms.  No significant ST-T changes.  Recent Labs: BMP Latest Ref Rng & Units 01/25/2018 01/11/2018 07/05/2017  Glucose 65 - 99 mg/dL 106(H) 85 93  BUN 8 - 27 mg/dL 21 20 22   Creatinine 0.76 - 1.27 mg/dL 1.23 1.30(H) 1.13  BUN/Creat Ratio 10 - 24 17 15  -  Sodium 134 - 144 mmol/L 137 140 139  Potassium 3.5 - 5.2 mmol/L 4.5 4.5 3.9  Chloride 96 - 106 mmol/L 97 103 104  CO2 20 - 29 mmol/L 23 26 29   Calcium 8.6 - 10.2 mg/dL 9.6 9.3 9.5     Hepatic Function Latest Ref Rng & Units 01/11/2018 07/05/2017 12/22/2016  Total Protein 6.1 - 8.1 g/dL 6.4 6.5 6.6  Albumin 3.5 - 5.2 g/dL - 4.1 4.3  AST 10 - 35 U/L 22 25 22   ALT 9 - 46 U/L 18 19 15   Alk Phosphatase 39 - 117 U/L - 25(L) 32(L)  Total Bilirubin  0.2 - 1.2 mg/dL 1.1 0.8 0.9  Bilirubin, Direct 0.0 - 0.3 mg/dL - - -    CBC Latest Ref Rng & Units 01/25/2018 12/22/2016 12/21/2015  WBC 3.4 - 10.8 x10E3/uL 4.4 7.7 5.8  Hemoglobin 13.0 - 17.7 g/dL 15.8 14.1 14.9  Hematocrit 37.5 - 51.0 % 45.7 41.2 44.9  Platelets 150 - 379 x10E3/uL 166 208.0 170.0   Lab Results  Component Value Date   MCV 95 01/25/2018   MCV 95.1 12/22/2016   MCV 96.4 12/21/2015   Lab Results  Component Value Date   TSH 1.830 01/25/2018   Lab Results  Component Value Date   HGBA1C 5.0 05/21/2012     BNP No results found for: BNP  ProBNP No results found for: PROBNP   Lipid Panel     Component Value Date/Time   CHOL 90 01/11/2018 1454   TRIG 52 01/11/2018 1454   HDL 45 01/11/2018 1454   CHOLHDL 2.0 01/11/2018 1454   VLDL 9.6 07/05/2017 1412   LDLCALC 43 07/05/2017 1412     RADIOLOGY: No results found.   Additional studies/ records that were reviewed today include:  I reviewed the prior evaluation.  In 2012.  I reviewed the primary care evaluation from [facility]. I reviewed the rhythm strips which have been taken from the Holter monitor which he is currently wearing.  ------------------------------------------------------------------- 01/21/2018  Echo Study Conclusions  - Left ventricle: The cavity size was normal. There was moderate   concentric hypertrophy. Systolic function was normal. The   estimated ejection fraction was in the range of 55% to 60%. Wall   motion was normal; there were no regional wall motion   abnormalities. The study was not technically sufficient to allow   evaluation of LV diastolic dysfunction due to atrial   fibrillation. - Aortic valve: Valve mobility was restricted. There was moderate   regurgitation. - Ascending aorta: The ascending aorta was upper normal size   measuring 40 mm. - Mitral valve: There was moderate regurgitation. - Left atrium: The atrium was severely dilated. - Right ventricle: The  cavity size was normal. Wall thickness was   normal. Systolic  function was normal. - Right atrium: The atrium was mildly dilated. - Tricuspid valve: There was moderate regurgitation. - Pulmonic valve: There was trivial regurgitation. - Pulmonary arteries: Systolic pressure was mildly increased. PA   peak pressure: 38 mm Hg (S). - Inferior vena cava: The vessel was normal in size. - Pericardium, extracardiac: There was no pericardial effusion.   ASSESSMENT:    1. Persistent atrial fibrillation ([place])   2. Anticoagulation adequate   3. Medication management   4. Essential hypertension   5. Hyperlipidemia, unspecified hyperlipidemia type      PLAN:  Mark Stark is a young appearing, active 84 year old gentleman who has a history of hypertension, hyperlipidemia, and developed new onset irregular heart rhythm on Christmas Eve 2018.  It appears that he has had persistent atrial fibrillation for at least 5 weeks.  He has now been on eliquis 5 mg twice a day for anticoagulation and continues to be on Toprol-XL 100 mg daily for rate control.  He also is on Cardura 4 mg daily and lisinopril for hypertension.  His ventricular rate is controlled in the 60s to 70s.  Presently.  He was noted to have a 3.1 second pause on his Holter monitor which occurred all sleeping.  He is unaware of any symptoms referrable to sleep apnea.  He sleeps well.  He denies snoring.  I reviewed recent laboratory.  I will check a bmet, TSH, and CBC today particular since he is on eliquis.  Recent creatinine was 1.3.  Magnesium will also be checked.  He will continue to wear his Holter monitor.  A 2-D echo Doppler study was performed on Mark Stark 28 2019 and showed an EF of 55-60%.  Aortic root was upper normal at 40 mm.  He had moderate MR.  His left atrium was severely dilated and he had mild RA dilation.  There was moderate TR and mild pulmonary hypertension.  I have not increased his medications for additional rate control or initiate  antiarrhythmic therapy in light of his recent 3.1 second pause. I will see him back in the office in 3-4 weeks, which will be one month after his anticoagulation initiation and his one-month Holter monitor should be complete at that time.  If he is still in atrial fibrillation, plans will be made for DC cardioversion.  Medication Adjustments/Labs and Tests Ordered: Current medicines are reviewed at length with the patient today.  Concerns regarding medicines are outlined above.  Medication changes, Labs and Tests ordered today are listed in the Patient Instructions below. Patient Instructions  Medication Instructions:  Your physician recommends that you continue on your current medications as directed. Please refer to the Current Medication list given to you today.  Labwork: CBC, TSH, BMET  Follow-Up: 4 weeks with Dr. Claiborne Billings (first week of March)  Any Other Special Instructions Will Be Listed Below (If Applicable).     If you need a refill on your cardiac medications before your next appointment, please call your pharmacy.      Signed, Shelva Majestic, MD  01/27/2018 12:17 PM    [facility] [address], [place], [place], [place]  [address] Phone: [phone]

## 2018-01-24 NOTE — Patient Instructions (Signed)
Medication Instructions:  Your physician recommends that you continue on your current medications as directed. Please refer to the Current Medication list given to you today.  Labwork: CBC, TSH, BMET  Follow-Up: 4 weeks with Dr. Claiborne Billings (first week of March)  Any Other Special Instructions Will Be Listed Below (If Applicable).     If you need a refill on your cardiac medications before your next appointment, please call your pharmacy.

## 2018-01-25 DIAGNOSIS — Z79899 Other long term (current) drug therapy: Secondary | ICD-10-CM | POA: Diagnosis not present

## 2018-01-25 DIAGNOSIS — I4891 Unspecified atrial fibrillation: Secondary | ICD-10-CM | POA: Diagnosis not present

## 2018-01-26 LAB — BASIC METABOLIC PANEL
BUN/Creatinine Ratio: 17 (ref 10–24)
BUN: 21 mg/dL (ref 8–27)
CALCIUM: 9.6 mg/dL (ref 8.6–10.2)
CHLORIDE: 97 mmol/L (ref 96–106)
CO2: 23 mmol/L (ref 20–29)
CREATININE: 1.23 mg/dL (ref 0.76–1.27)
GFR calc non Af Amer: 52 mL/min/{1.73_m2} — ABNORMAL LOW (ref >59)
GFR, EST AFRICAN AMERICAN: 61 mL/min/{1.73_m2} (ref >59)
GLUCOSE: 106 mg/dL — AB (ref 65–99)
Potassium: 4.5 mmol/L (ref 3.5–5.2)
Sodium: 137 mmol/L (ref 134–144)

## 2018-01-26 LAB — CBC
HEMOGLOBIN: 15.8 g/dL (ref 13.0–17.7)
Hematocrit: 45.7 % (ref 37.5–51.0)
MCH: 32.7 pg (ref 26.6–33.0)
MCHC: 34.6 g/dL (ref 31.5–35.7)
MCV: 95 fL (ref 79–97)
Platelets: 166 10*3/uL (ref 150–379)
RBC: 4.83 x10E6/uL (ref 4.14–5.80)
RDW: 13.7 % (ref 12.3–15.4)
WBC: 4.4 10*3/uL (ref 3.4–10.8)

## 2018-01-26 LAB — TSH: TSH: 1.83 u[IU]/mL (ref 0.450–4.500)

## 2018-01-27 ENCOUNTER — Encounter: Payer: Self-pay | Admitting: Cardiovascular Disease

## 2018-01-29 ENCOUNTER — Encounter: Payer: Self-pay | Admitting: *Deleted

## 2018-01-30 ENCOUNTER — Other Ambulatory Visit: Payer: Self-pay

## 2018-01-30 ENCOUNTER — Other Ambulatory Visit: Payer: Self-pay | Admitting: Family Medicine

## 2018-01-30 DIAGNOSIS — F411 Generalized anxiety disorder: Secondary | ICD-10-CM

## 2018-01-30 MED ORDER — CLORAZEPATE DIPOTASSIUM 15 MG PO TABS: ORAL_TABLET | ORAL | 1 refills | Status: DC

## 2018-01-30 MED ORDER — FENOFIBRATE 160 MG PO TABS: 160.0000 mg | ORAL_TABLET | Freq: Every day | ORAL | 1 refills | Status: DC

## 2018-01-30 NOTE — Telephone Encounter (Signed)
Requesting: CLORAZEPATE 15 MG Contract:-NO UDS:-NO Last OV:01/11/2018 Next OV: 07/12/2018 Last Refill: 09/17/2017   Please advise

## 2018-01-31 DIAGNOSIS — H35351 Cystoid macular degeneration, right eye: Secondary | ICD-10-CM | POA: Diagnosis not present

## 2018-01-31 DIAGNOSIS — H353132 Nonexudative age-related macular degeneration, bilateral, intermediate dry stage: Secondary | ICD-10-CM | POA: Diagnosis not present

## 2018-01-31 DIAGNOSIS — Z961 Presence of intraocular lens: Secondary | ICD-10-CM | POA: Diagnosis not present

## 2018-01-31 DIAGNOSIS — H35373 Puckering of macula, bilateral: Secondary | ICD-10-CM | POA: Diagnosis not present

## 2018-01-31 DIAGNOSIS — H43813 Vitreous degeneration, bilateral: Secondary | ICD-10-CM | POA: Diagnosis not present

## 2018-02-05 ENCOUNTER — Ambulatory Visit: Payer: Medicare Other | Admitting: Cardiology

## 2018-02-05 ENCOUNTER — Telehealth: Payer: Self-pay | Admitting: *Deleted

## 2018-02-05 NOTE — Telephone Encounter (Signed)
Received hand-written letter from patient inquiring about his Rx Lisinopril, as he states that he "read carefully the information sheet that comes with it"; he goes on to inquire as to if "might it be possible that his Afib and Elevated Creatinine are related to the Lisinopril", as he read irregular HB and kidney problems are possible side effects. Patient then request provider's opinion on if he should d/c Rx in question and double his Toprol dosage instead; forwarded to provider/SLS 02/12

## 2018-02-08 ENCOUNTER — Telehealth: Payer: Self-pay | Admitting: *Deleted

## 2018-02-08 NOTE — Telephone Encounter (Signed)
Patient had written a letter asking if lisinopril causes a-fib.  Per Dr. Etter Sjogren lisinopril can definitely cause increase creatinine and potassium but she has not seen it cause afib.  He needs to call his cardiologist about this matter.

## 2018-02-08 NOTE — Telephone Encounter (Signed)
Left message on machine about Dr. Ivy Lynn note

## 2018-02-13 NOTE — Progress Notes (Signed)
Pt appt is 02/22/2018

## 2018-02-22 ENCOUNTER — Ambulatory Visit (INDEPENDENT_AMBULATORY_CARE_PROVIDER_SITE_OTHER): Payer: Medicare Other | Admitting: Cardiovascular Disease

## 2018-02-22 ENCOUNTER — Encounter: Payer: Self-pay | Admitting: Cardiovascular Disease

## 2018-02-22 VITALS — BP 132/84 | HR 81 | Ht 68.0 in | Wt 168.2 lb

## 2018-02-22 DIAGNOSIS — I481 Persistent atrial fibrillation: Secondary | ICD-10-CM | POA: Diagnosis not present

## 2018-02-22 DIAGNOSIS — I4819 Other persistent atrial fibrillation: Secondary | ICD-10-CM

## 2018-02-22 DIAGNOSIS — E785 Hyperlipidemia, unspecified: Secondary | ICD-10-CM | POA: Diagnosis not present

## 2018-02-22 DIAGNOSIS — I10 Essential (primary) hypertension: Secondary | ICD-10-CM

## 2018-02-22 DIAGNOSIS — Z7901 Long term (current) use of anticoagulants: Secondary | ICD-10-CM | POA: Diagnosis not present

## 2018-02-22 DIAGNOSIS — I4891 Unspecified atrial fibrillation: Secondary | ICD-10-CM | POA: Diagnosis not present

## 2018-02-22 NOTE — H&P (View-Only) (Signed)
Cardiology Office Note    Date:  02/23/2018   ID:  Mark Riles., DOB 1928-10-01, MRN [id number]  PCP:  [name], DO  Cardiologist:  Shelva Majestic, MD   New cardiology evaluation, referred through the courtesy of Dr. Roma Schanz for evaluation of an irregular rhythm in atrial fibrillation.  History of Present Illness:  Mark Stark. is a 84 y.o. male who was referred by Dr. [name] for atrial fibrillation.  I saw him as an add-on on 01/24/2018 and he presents for reevaluation.  Mr. Willmore is a young appearing 84 year old male who remotely had seen Dr. Percival Spanish on one occasion in 2012.  He was noted have a mild slight heart murmur.  Over the last several years he has continued to be active.  He typically walks for 1 hour every morning.  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  On Christmas Eve 2018, he developed a sensation of an irregular rhythm.  His heart rhythm has persisted.  He had seen Dr. Roma Schanz and was found to be in atrial fibrillation.  He was started on eliquis 5 mg twice a day for anticoagulation.  He has been taking metoprolol succinate 100 mg daily, lisinopril 5 mg, and doxazocin 4 mg for hypertension.  He is on simvastatin 20 mg daily for hyperlipidemia.  When I saw him, he had just started to wear a Holter monitor.   His initial strips confirm persistent atrial fibrillation.  A [facility] service read out from 01/24/2018 at 450:54 AM has revealed a 3.1 second pause while the patient was sleeping. Mark Stark  He denied any chest pain, pesyncope, PND, orthopnea.  He had experience mild fatigue and slight shortness of breath.  He denied issues with sleep.    When I saw him, with his positive did not further titrate his medications.  He apparently just completed wearing a Holter monitor in turn the machine in yesterday.  The date has not yet been processed and is not available for review.  Over the past several weeks, he has felt  well.  He is on eliquis for anticoagulation and started therapy on January 13.  He underwent a 2-D echo Doppler study on 01/21/2018 which showed an EF of 55-60%.  A sending aorta was upper normal at 40 mm.  He had moderate mitral regurgitation.  His left atrium measured 55 mm was interpreted as severe dilation.  LA volume was 56 mL's per meter squared.  He has felt fairly well.  This past week.  He planted 2 rows of peas over 65 feet long.  He walks an hour a day for up to 3 miles.  He denies bleeding.  On eliquis and has now been on anticoagulation for 6 weeks.  He presents for reevaluation   Past Medical History:  Diagnosis Date  . Anxiety   . Diverticulitis   . Dyslipidemia   . Hypertension   . 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 Prostate cancer [facility])     Past Surgical History:  Procedure Laterality Date  . CATARACT EXTRACTION  01/27/2010   right  . CATARACT EXTRACTION  03/04/2010   left  . Prostate seed implants    . PROSTATE SURGERY      Current Medications: Outpatient Medications Prior to Visit  Medication Sig Dispense Refill  . apixaban (ELIQUIS) 5 MG TABS tablet Take 1 tablet (5 mg total) by mouth 2 (two) times  daily. 60 tablet 2  . b complex vitamins tablet Take 1 tablet by mouth daily.      . Calcium Carbonate-Vitamin D (CALTRATE 600+D) 600-400 MG-UNIT per tablet Take 1 tablet by mouth daily.      . clorazepate (TRANXENE) 15 MG tablet Take 1/2 tablet by mouth once daily 15 tablet 1  . cromolyn (NASALCROM) 5.2 MG/ACT nasal spray 2 sprays by Nasal route 2 (two) times daily.      Mark Stark doxazosin (CARDURA) 4 MG tablet Take 1 tablet (4 mg total) by mouth at bedtime. 90 tablet 1  . fenofibrate 160 MG tablet Take 1 tablet (160 mg total) by mouth daily. 90 tablet 1  . fish oil-omega-3 fatty acids 1000 MG capsule Take 4 capsules by mouth daily.     Mark Stark lisinopril (PRINIVIL,ZESTRIL) 5 MG tablet TAKE 1 TABLET BY MOUTH EVERY DAY 90 tablet 0  . metoprolol (TOPROL-XL) 200 MG 24 hr  tablet Take 1/2 tablet by mouth once daily 90 tablet 1  . Multiple Vitamins-Minerals (PRESERVISION AREDS 2 PO) Take 1 capsule by mouth daily.    . Multiple Vitamins-Minerals (PRESERVISION AREDS 2) CAPS 2 po qd    . simvastatin (ZOCOR) 20 MG tablet TAKE 1 TABLET BY MOUTH EVERYDAY AT BEDTIME 90 tablet 0   No facility-administered medications prior to visit.      Allergies:   Sulfonamide derivatives   Social History   Socioeconomic History  . Marital status: Married    Spouse name: None  . Number of children: None  . Years of education: None  . Highest education level: None  Social Needs  . Financial resource strain: None  . Food insecurity - worry: None  . Food insecurity - inability: None  . Transportation needs - medical: None  . Transportation needs - non-medical: None  Occupational History  . Occupation: Professor---RETIRED    Employer: RETIRED    Comment: Retired  Tobacco Use  . Smoking status: Never Smoker  . Smokeless tobacco: Never Used  Substance and Sexual Activity  . Alcohol use: No    Comment: Little wine occasionally   . Drug use: No  . Sexual activity: No    Partners: Female  Other Topics Concern  . None  Social History Narrative  . None     Family History:  The patient's family history includes Alzheimer's disease in his mother; Cancer (age of onset: 40) in his brother; Dementia in his mother; Mental illness in his mother.  His father is deceased and had suffered a myocardial infarction and had 3 CVAs.  His mother died and had Alzheimer's disease.  ROS General: Negative; No fevers, chills, or night sweats;  HEENT: Negative; No changes in vision or hearing, sinus congestion, difficulty swallowing Pulmonary: Negative; No cough, wheezing, shortness of breath, hemoptysis Cardiovascular: See history of present illness GI: Negative; No nausea, vomiting, diarrhea, or abdominal pain GU: Negative; No dysuria, hematuria, or difficulty voiding Musculoskeletal:  Negative; no myalgias, joint pain, or weakness Hematologic/Oncology: Negative; no easy bruising, bleeding Endocrine: Negative; no heat/cold intolerance; no diabetes Neuro: Negative; no changes in balance, headaches Skin: Negative; No rashes or skin lesions Psychiatric: Negative; No behavioral problems, depression Sleep: Negative; No snoring, daytime sleepiness, hypersomnolence, bruxism, restless legs, hypnogognic hallucinations, no cataplexy Other comprehensive 14 point system review is negative.   PHYSICAL EXAM:   VS:  BP 132/84   Pulse 81   Ht _0  (1.727 m)   Wt 168 lb 3.2 oz (76.3 kg)   BMI 25.57  kg/m      BP blood pressure by me was 128/84  Wt Readings from Last 3 Encounters:  02/22/18 168 lb 3.2 oz (76.3 kg)  01/24/18 170 lb (77.1 kg)  01/11/18 168 lb 12.8 oz (76.6 kg)    General: Alert, oriented, no distress.  Skin: normal turgor, no rashes, warm and dry HEENT: Normocephalic, atraumatic. Pupils equal round and reactive to light; sclera anicteric; extraocular muscles intact;  Nose without nasal septal hypertrophy Mouth/Parynx benign; Mallinpatti scale 3 Neck: No JVD, no carotid bruits; normal carotid upstroke Lungs: clear to ausculatation and percussion; no wheezing or rales Chest wall: without tenderness to palpitation Heart: PMI not displaced, irregular irregular with a controlled ventricular rate; s1 s2 normal, 1/6 systolic murmur, no diastolic murmur, no rubs, gallops, thrills, or heaves Abdomen: soft, nontender; no hepatosplenomehaly, BS+; abdominal aorta nontender and not dilated by palpation. Back: no CVA tenderness Pulses 2+ Musculoskeletal: full range of motion, normal strength, no joint deformities Extremities: no clubbing cyanosis or edema, Homan's sign negative  Neurologic: grossly nonfocal; Cranial nerves grossly wnl Psychologic: Normal mood and affect    Studies/Labs Reviewed:   EKG:  EKG is ordered today.  Atrial fibrillation at 81 bpm.  QTc  interval 397 ms.  01/24/2018 ECG (independently read by me): atrial fibrillation at 73 bpm. QTc interval 387 ms.  No significant ST-T changes.  Recent Labs: BMP Latest Ref Rng & Units 01/25/2018 01/11/2018 07/05/2017  Glucose 65 - 99 mg/dL 106(H) 85 93  BUN 8 - 27 mg/dL _0 Creatinine 0.76 - 1.27 mg/dL 1.23 1.30(H) 1.13  BUN/Creat Ratio 10 - _1 -  Sodium 134 - 144 mmol/L 137 140 139  Potassium 3.5 - 5.2 mmol/L 4.5 4.5 3.9  Chloride 96 - 106 mmol/L 97 103 104  CO2 20 - 29 mmol/L _2 Calcium 8.6 - 10.2 mg/dL 9.6 9.3 9.5     Hepatic Function Latest Ref Rng & Units 01/11/2018 07/05/2017 12/22/2016  Total Protein 6.1 - 8.1 g/dL 6.4 6.5 6.6  Albumin 3.5 - 5.2 g/dL - 4.1 4.3  AST 10 - 35 U/L _3 ALT 9 - 46 U/L _4 Alk Phosphatase 39 - 117 U/L - 25(L) 32(L)  Total Bilirubin 0.2 - 1.2 mg/dL 1.1 0.8 0.9  Bilirubin, Direct 0.0 - 0.3 mg/dL - - -    CBC Latest Ref Rng & Units 01/25/2018 12/22/2016 12/21/2015  WBC 3.4 - 10.8 x10E3/uL 4.4 7.7 5.8  Hemoglobin 13.0 - 17.7 g/dL 15.8 14.1 14.9  Hematocrit 37.5 - 51.0 % 45.7 41.2 44.9  Platelets 150 - 379 x10E3/uL 166 208.0 170.0   Lab Results  Component Value Date   MCV 95 01/25/2018   MCV 95.1 12/22/2016   MCV 96.4 12/21/2015   Lab Results  Component Value Date   TSH 1.830 01/25/2018   Lab Results  Component Value Date   HGBA1C 5.0 05/21/2012     BNP No results found for: BNP  ProBNP No results found for: PROBNP   Lipid Panel     Component Value Date/Time   CHOL 90 01/11/2018 1454   TRIG 52 01/11/2018 1454   HDL 45 01/11/2018 1454   CHOLHDL 2.0 01/11/2018 1454   VLDL 9.6 07/05/2017 1412   LDLCALC 43 07/05/2017 1412     RADIOLOGY: No results found.   Additional studies/ records that were reviewed today include:  I reviewed the prior evaluation.  In 2012.  I reviewed the  primary care evaluation from [facility]. I reviewed the rhythm strips which have been taken from the Holter monitor  which he is currently wearing.  ------------------------------------------------------------------- 01/21/2018  Echo Study Conclusions  - Left ventricle: The cavity size was normal. There was moderate   concentric hypertrophy. Systolic function was normal. The   estimated ejection fraction was in the range of 55% to 60%. Wall   motion was normal; there were no regional wall motion   abnormalities. The study was not technically sufficient to allow   evaluation of LV diastolic dysfunction due to atrial   fibrillation. - Aortic valve: Valve mobility was restricted. There was moderate   regurgitation. - Ascending aorta: The ascending aorta was upper normal size   measuring 40 mm. - Mitral valve: There was moderate regurgitation. - Left atrium: The atrium was severely dilated. - Right ventricle: The cavity size was normal. Wall thickness was   normal. Systolic function was normal. - Right atrium: The atrium was mildly dilated. - Tricuspid valve: There was moderate regurgitation. - Pulmonic valve: There was trivial regurgitation. - Pulmonary arteries: Systolic pressure was mildly increased. PA   peak pressure: 38 mm Hg (S). - Inferior vena cava: The vessel was normal in size. - Pericardium, extracardiac: There was no pericardial effusion.   ASSESSMENT:    1. Atrial fibrillation, unspecified type ([place])   2. Persistent atrial fibrillation ([place])   3. Anticoagulation adequate   4. Essential hypertension   5. Hyperlipidemia, unspecified hyperlipidemia type      PLAN:  Mr.Stark is a young appearing, active 84 year old gentleman who has a history of hypertension, hyperlipidemia, and developed new onset irregular heart rhythm on Christmas Eve 2018.  He was started on eliquis anticoagulation after seeing Dr. [name] and has been on therapy since 01/06/2018.  He just handed.  His Holter monitor in yesterday.  The results are not yet process.  He is tolerating anticoagulation.  I reviewed  his echo Doppler study, which shows preserved LV function.  However, his left atrium is enlarged.  I went back and looked at his 2012 echo.  At that time, LA was also increased at 49 mm, but no comment was made.  Presently, we discussed potential restoration of sinus rhythm.  His left atrium is dilated, which may make reduce a successful cardioversion and may increase potential recurrence of AF if sinus rhythm is restored.  We discussed potentially initiating low-dose antiarrhythmic therapy, but I'm hesitant to do this since I'm uncertain if he is having further slow heart rates.  As result, we will tentatively plan to undergo DC cardioversion next week, but he will contemplate this over the weekend and let us know.  Discussed the risk, benefits of cardioversion.  If cardioversion is performed laboratory will be obtained.  If cardioversion fails, we discussed also continuation of rate control since he has remained fairly active and continues to walk up to 3 miles per day despite being in atrial fibrillation.  He continues to be on simvastatin for hyperlipidemia.  LDL on gender.  He 19 2019 was excellent at 32.  He has stable renal function with a creatinine of 1.2 on 01/25/2018.   Medication Adjustments/Labs and Tests Ordered: Current medicines are reviewed at length with the patient today.  Concerns regarding medicines are outlined above.  Medication changes, Labs and Tests ordered today are listed in the Patient Instructions below. Patient Instructions  Medication Instructions:  Your physician recommends that you continue on your current medications as directed. Please  refer to the Current Medication list given to you today.  Labwork: If we choose to do cardioversion next week, you will need lab work prior (BMET, CBC, PT/INR)-I will discuss with you on Monday  Testing/Procedures: Your physician has recommended that you have a Cardioversion (DCCV) next week with Dr. Claiborne Billings. Electrical Cardioversion uses  a jolt of electricity to your heart either through paddles or wired patches attached to your chest. This is a controlled, usually prescheduled, procedure. Defibrillation is done under light anesthesia in the hospital, and you usually go home the day of the procedure. This is done to get your heart back into a normal rhythm. You are not awake for the procedure. Please see the instruction sheet given to you today.  **We will call you Monday to discuss**  Follow-Up: Pending    If you need a refill on your cardiac medications before your next appointment, please call your pharmacy.      Signed, Shelva Majestic, MD  02/23/2018 10:31 AM    [facility] [address] [place], [place], [place]  [address] Phone: [phone]

## 2018-02-22 NOTE — Patient Instructions (Addendum)
Medication Instructions:  Your physician recommends that you continue on your current medications as directed. Please refer to the Current Medication list given to you today.  Labwork: If we choose to do cardioversion next week, you will need lab work prior (BMET, CBC, PT/INR)-I will discuss with you on Monday  Testing/Procedures: Your physician has recommended that you have a Cardioversion (DCCV) next week with Dr. Claiborne Billings. Electrical Cardioversion uses a jolt of electricity to your heart either through paddles or wired patches attached to your chest. This is a controlled, usually prescheduled, procedure. Defibrillation is done under light anesthesia in the hospital, and you usually go home the day of the procedure. This is done to get your heart back into a normal rhythm. You are not awake for the procedure. Please see the instruction sheet given to you today.  **We will call you Monday to discuss**  Follow-Up: Pending    If you need a refill on your cardiac medications before your next appointment, please call your pharmacy.

## 2018-02-22 NOTE — Progress Notes (Signed)
 Cardiology Office Note    Date:  02/23/2018   ID:  Mark E Bi Jr., DOB 02/26/1928, MRN [id number]  PCP:  Lowne Chase, Yvonne R, DO  Cardiologist:  Caellum Mancil, MD   New cardiology evaluation, referred through the courtesy of Dr. Yvonne Lowne Chase for evaluation of an irregular rhythm in atrial fibrillation.  History of Present Illness:  Mark E Peretz Jr. is a 84 y.o. male who was referred by Dr. Lowne Chase for atrial fibrillation.  I saw him as an add-on on 01/24/2018 and he presents for reevaluation.  Mark Stark is a young appearing 84-year-old male who remotely had seen Dr. Hochrein on one occasion in 2012.  He was noted have a mild slight heart murmur.  Over the last several years he has continued to be active.  He typically walks for 1 hour every morning.  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  On Christmas Eve 2018, he developed a sensation of an irregular rhythm.  His heart rhythm has persisted.  He had seen Dr. Yvonne Lowne Chase and was found to be in atrial fibrillation.  He was started on eliquis 5 mg twice a day for anticoagulation.  He has been taking metoprolol succinate 100 mg daily, lisinopril 5 mg, and doxazocin 4 mg for hypertension.  He is on simvastatin 20 mg daily for hyperlipidemia.  When I saw him, he had just started to wear a Holter monitor.   His initial strips confirm persistent atrial fibrillation.  A [facility] service read out from 01/24/2018 at 450:54 AM has revealed a 3.1 second pause while the patient was sleeping. .  He denied any chest pain, pesyncope, PND, orthopnea.  He had experience mild fatigue and slight shortness of breath.  He denied issues with sleep.    When I saw him, with his positive did not further titrate his medications.  He apparently just completed wearing a Holter monitor in turn the machine in yesterday.  The date has not yet been processed and is not available for review.  Over the past several weeks, he has felt  well.  He is on eliquis for anticoagulation and started therapy on January 13.  He underwent a 2-D echo Doppler study on 01/21/2018 which showed an EF of 55-60%.  A sending aorta was upper normal at 40 mm.  He had moderate mitral regurgitation.  His left atrium measured 55 mm was interpreted as severe dilation.  LA volume was 56 mL's per meter squared.  He has felt fairly well.  This past week.  He planted 2 rows of peas over 65 feet long.  He walks an hour a day for up to 3 miles.  He denies bleeding.  On eliquis and has now been on anticoagulation for 6 weeks.  He presents for reevaluation   Past Medical History:  Diagnosis Date  . Anxiety   . Diverticulitis   . Dyslipidemia   . Hypertension   . Inferior mesenteric vein thrombosis (HCC)   . Prostate cancer (HCC)     Past Surgical History:  Procedure Laterality Date  . CATARACT EXTRACTION  01/27/2010   right  . CATARACT EXTRACTION  03/04/2010   left  . Prostate seed implants    . PROSTATE SURGERY      Current Medications: Outpatient Medications Prior to Visit  Medication Sig Dispense Refill  . apixaban (ELIQUIS) 5 MG TABS tablet Take 1 tablet (5 mg total) by mouth 2 (two) times   daily. 60 tablet 2  . b complex vitamins tablet Take 1 tablet by mouth daily.      . Calcium Carbonate-Vitamin D (CALTRATE 600+D) 600-400 MG-UNIT per tablet Take 1 tablet by mouth daily.      . clorazepate (TRANXENE) 15 MG tablet Take 1/2 tablet by mouth once daily 15 tablet 1  . cromolyn (NASALCROM) 5.2 MG/ACT nasal spray 2 sprays by Nasal route 2 (two) times daily.      . doxazosin (CARDURA) 4 MG tablet Take 1 tablet (4 mg total) by mouth at bedtime. 90 tablet 1  . fenofibrate 160 MG tablet Take 1 tablet (160 mg total) by mouth daily. 90 tablet 1  . fish oil-omega-3 fatty acids 1000 MG capsule Take 4 capsules by mouth daily.     . lisinopril (PRINIVIL,ZESTRIL) 5 MG tablet TAKE 1 TABLET BY MOUTH EVERY DAY 90 tablet 0  . metoprolol (TOPROL-XL) 200 MG 24 hr  tablet Take 1/2 tablet by mouth once daily 90 tablet 1  . Multiple Vitamins-Minerals (PRESERVISION AREDS 2 PO) Take 1 capsule by mouth daily.    . Multiple Vitamins-Minerals (PRESERVISION AREDS 2) CAPS 2 po qd    . simvastatin (ZOCOR) 20 MG tablet TAKE 1 TABLET BY MOUTH EVERYDAY AT BEDTIME 90 tablet 0   No facility-administered medications prior to visit.      Allergies:   Sulfonamide derivatives   Social History   Socioeconomic History  . Marital status: Married    Spouse name: None  . Number of children: None  . Years of education: None  . Highest education level: None  Social Needs  . Financial resource strain: None  . Food insecurity - worry: None  . Food insecurity - inability: None  . Transportation needs - medical: None  . Transportation needs - non-medical: None  Occupational History  . Occupation: Professor---RETIRED    Employer: RETIRED    Comment: Retired  Tobacco Use  . Smoking status: Never Smoker  . Smokeless tobacco: Never Used  Substance and Sexual Activity  . Alcohol use: No    Comment: Little wine occasionally   . Drug use: No  . Sexual activity: No    Partners: Female  Other Topics Concern  . None  Social History Narrative  . None     Family History:  The patient's family history includes Alzheimer's disease in his mother; Cancer (age of onset: 80) in his brother; Dementia in his mother; Mental illness in his mother.  His father is deceased and had suffered a myocardial infarction and had 3 CVAs.  His mother died and had Alzheimer's disease.  ROS General: Negative; No fevers, chills, or night sweats;  HEENT: Negative; No changes in vision or hearing, sinus congestion, difficulty swallowing Pulmonary: Negative; No cough, wheezing, shortness of breath, hemoptysis Cardiovascular: See history of present illness GI: Negative; No nausea, vomiting, diarrhea, or abdominal pain GU: Negative; No dysuria, hematuria, or difficulty voiding Musculoskeletal:  Negative; no myalgias, joint pain, or weakness Hematologic/Oncology: Negative; no easy bruising, bleeding Endocrine: Negative; no heat/cold intolerance; no diabetes Neuro: Negative; no changes in balance, headaches Skin: Negative; No rashes or skin lesions Psychiatric: Negative; No behavioral problems, depression Sleep: Negative; No snoring, daytime sleepiness, hypersomnolence, bruxism, restless legs, hypnogognic hallucinations, no cataplexy Other comprehensive 14 point system review is negative.   PHYSICAL EXAM:   VS:  BP 132/84   Pulse 81   Ht 5' 8" (1.727 m)   Wt 168 lb 3.2 oz (76.3 kg)   BMI 25.57   kg/m      BP blood pressure by me was 128/84  Wt Readings from Last 3 Encounters:  02/22/18 168 lb 3.2 oz (76.3 kg)  01/24/18 170 lb (77.1 kg)  01/11/18 168 lb 12.8 oz (76.6 kg)    General: Alert, oriented, no distress.  Skin: normal turgor, no rashes, warm and dry HEENT: Normocephalic, atraumatic. Pupils equal round and reactive to light; sclera anicteric; extraocular muscles intact;  Nose without nasal septal hypertrophy Mouth/Parynx benign; Mallinpatti scale 3 Neck: No JVD, no carotid bruits; normal carotid upstroke Lungs: clear to ausculatation and percussion; no wheezing or rales Chest wall: without tenderness to palpitation Heart: PMI not displaced, irregular irregular with a controlled ventricular rate; s1 s2 normal, 1/6 systolic murmur, no diastolic murmur, no rubs, gallops, thrills, or heaves Abdomen: soft, nontender; no hepatosplenomehaly, BS+; abdominal aorta nontender and not dilated by palpation. Back: no CVA tenderness Pulses 2+ Musculoskeletal: full range of motion, normal strength, no joint deformities Extremities: no clubbing cyanosis or edema, Homan's sign negative  Neurologic: grossly nonfocal; Cranial nerves grossly wnl Psychologic: Normal mood and affect    Studies/Labs Reviewed:   EKG:  EKG is ordered today.  Atrial fibrillation at 81 bpm.  QTc  interval 397 ms.  01/24/2018 ECG (independently read by me): atrial fibrillation at 73 bpm. QTc interval 387 ms.  No significant ST-T changes.  Recent Labs: BMP Latest Ref Rng & Units 01/25/2018 01/11/2018 07/05/2017  Glucose 65 - 99 mg/dL 106(H) 85 93  BUN 8 - 27 mg/dL 21 20 22  Creatinine 0.76 - 1.27 mg/dL 1.23 1.30(H) 1.13  BUN/Creat Ratio 10 - 24 17 15 -  Sodium 134 - 144 mmol/L 137 140 139  Potassium 3.5 - 5.2 mmol/L 4.5 4.5 3.9  Chloride 96 - 106 mmol/L 97 103 104  CO2 20 - 29 mmol/L 23 26 29  Calcium 8.6 - 10.2 mg/dL 9.6 9.3 9.5     Hepatic Function Latest Ref Rng & Units 01/11/2018 07/05/2017 12/22/2016  Total Protein 6.1 - 8.1 g/dL 6.4 6.5 6.6  Albumin 3.5 - 5.2 g/dL - 4.1 4.3  AST 10 - 35 U/L 22 25 22  ALT 9 - 46 U/L 18 19 15  Alk Phosphatase 39 - 117 U/L - 25(L) 32(L)  Total Bilirubin 0.2 - 1.2 mg/dL 1.1 0.8 0.9  Bilirubin, Direct 0.0 - 0.3 mg/dL - - -    CBC Latest Ref Rng & Units 01/25/2018 12/22/2016 12/21/2015  WBC 3.4 - 10.8 x10E3/uL 4.4 7.7 5.8  Hemoglobin 13.0 - 17.7 g/dL 15.8 14.1 14.9  Hematocrit 37.5 - 51.0 % 45.7 41.2 44.9  Platelets 150 - 379 x10E3/uL 166 208.0 170.0   Lab Results  Component Value Date   MCV 95 01/25/2018   MCV 95.1 12/22/2016   MCV 96.4 12/21/2015   Lab Results  Component Value Date   TSH 1.830 01/25/2018   Lab Results  Component Value Date   HGBA1C 5.0 05/21/2012     BNP No results found for: BNP  ProBNP No results found for: PROBNP   Lipid Panel     Component Value Date/Time   CHOL 90 01/11/2018 1454   TRIG 52 01/11/2018 1454   HDL 45 01/11/2018 1454   CHOLHDL 2.0 01/11/2018 1454   VLDL 9.6 07/05/2017 1412   LDLCALC 43 07/05/2017 1412     RADIOLOGY: No results found.   Additional studies/ records that were reviewed today include:  I reviewed the prior evaluation.  In 2012.  I reviewed the   primary care evaluation from Dr.Lowne Chase. I reviewed the rhythm strips which have been taken from the Holter monitor  which he is currently wearing.  ------------------------------------------------------------------- 01/21/2018  Echo Study Conclusions  - Left ventricle: The cavity size was normal. There was moderate   concentric hypertrophy. Systolic function was normal. The   estimated ejection fraction was in the range of 55% to 60%. Wall   motion was normal; there were no regional wall motion   abnormalities. The study was not technically sufficient to allow   evaluation of LV diastolic dysfunction due to atrial   fibrillation. - Aortic valve: Valve mobility was restricted. There was moderate   regurgitation. - Ascending aorta: The ascending aorta was upper normal size   measuring 40 mm. - Mitral valve: There was moderate regurgitation. - Left atrium: The atrium was severely dilated. - Right ventricle: The cavity size was normal. Wall thickness was   normal. Systolic function was normal. - Right atrium: The atrium was mildly dilated. - Tricuspid valve: There was moderate regurgitation. - Pulmonic valve: There was trivial regurgitation. - Pulmonary arteries: Systolic pressure was mildly increased. PA   peak pressure: 38 mm Hg (S). - Inferior vena cava: The vessel was normal in size. - Pericardium, extracardiac: There was no pericardial effusion.   ASSESSMENT:    1. Atrial fibrillation, unspecified type (HCC)   2. Persistent atrial fibrillation (HCC)   3. Anticoagulation adequate   4. Essential hypertension   5. Hyperlipidemia, unspecified hyperlipidemia type      PLAN:  MarkKopecky is a young appearing, active 84-year-old gentleman who has a history of hypertension, hyperlipidemia, and developed new onset irregular heart rhythm on Christmas Eve 2018.  He was started on eliquis anticoagulation after seeing Dr. Lowne Chase and has been on therapy since 01/06/2018.  He just handed.  His Holter monitor in yesterday.  The results are not yet process.  He is tolerating anticoagulation.  I reviewed  his echo Doppler study, which shows preserved LV function.  However, his left atrium is enlarged.  I went back and looked at his 2012 echo.  At that time, LA was also increased at 49 mm, but no comment was made.  Presently, we discussed potential restoration of sinus rhythm.  His left atrium is dilated, which may make reduce a successful cardioversion and may increase potential recurrence of AF if sinus rhythm is restored.  We discussed potentially initiating low-dose antiarrhythmic therapy, but I'm hesitant to do this since I'm uncertain if he is having further slow heart rates.  As result, we will tentatively plan to undergo DC cardioversion next week, but he will contemplate this over the weekend and let us know.  Discussed the risk, benefits of cardioversion.  If cardioversion is performed laboratory will be obtained.  If cardioversion fails, we discussed also continuation of rate control since he has remained fairly active and continues to walk up to 3 miles per day despite being in atrial fibrillation.  He continues to be on simvastatin for hyperlipidemia.  LDL on gender.  He 19 2019 was excellent at 32.  He has stable renal function with a creatinine of 1.2 on 01/25/2018.   Medication Adjustments/Labs and Tests Ordered: Current medicines are reviewed at length with the patient today.  Concerns regarding medicines are outlined above.  Medication changes, Labs and Tests ordered today are listed in the Patient Instructions below. Patient Instructions  Medication Instructions:  Your physician recommends that you continue on your current medications as directed. Please   refer to the Current Medication list given to you today.  Labwork: If we choose to do cardioversion next week, you will need lab work prior (BMET, CBC, PT/INR)-I will discuss with you on Monday  Testing/Procedures: Your physician has recommended that you have a Cardioversion (DCCV) next week with Dr. Timea Breed. Electrical Cardioversion uses  a jolt of electricity to your heart either through paddles or wired patches attached to your chest. This is a controlled, usually prescheduled, procedure. Defibrillation is done under light anesthesia in the hospital, and you usually go home the day of the procedure. This is done to get your heart back into a normal rhythm. You are not awake for the procedure. Please see the instruction sheet given to you today.  **We will call you Monday to discuss**  Follow-Up: Pending    If you need a refill on your cardiac medications before your next appointment, please call your pharmacy.      Signed, Shree Espey, MD  02/23/2018 10:31 AM     [facility] [address], [address], [place],   [address] Phone: [phone]    

## 2018-02-23 ENCOUNTER — Encounter: Payer: Self-pay | Admitting: Cardiovascular Disease

## 2018-02-25 ENCOUNTER — Telehealth: Payer: Self-pay | Admitting: *Deleted

## 2018-02-25 DIAGNOSIS — I480 Paroxysmal atrial fibrillation: Secondary | ICD-10-CM

## 2018-02-25 DIAGNOSIS — Z01812 Encounter for preprocedural laboratory examination: Secondary | ICD-10-CM

## 2018-02-25 NOTE — Telephone Encounter (Signed)
Called patient and verbalized he is ready to scheduled cardioversion.  Patient would like this on Friday with Dr. Claiborne Billings.  Patient will come in to get labs drawn on Wed 3/6.  Will review instructions at that time.     Patient aware and verbalized understanding.   DCCV scheduled for Friday 3/8 with Dr. Claiborne Billings at 12 pm, patient to arrive at 10:30 am.

## 2018-02-27 ENCOUNTER — Encounter: Payer: Self-pay | Admitting: *Deleted

## 2018-02-27 DIAGNOSIS — I480 Paroxysmal atrial fibrillation: Secondary | ICD-10-CM

## 2018-02-27 DIAGNOSIS — Z01812 Encounter for preprocedural laboratory examination: Secondary | ICD-10-CM | POA: Diagnosis not present

## 2018-02-27 LAB — CBC
HEMATOCRIT: 40.9 % (ref 37.5–51.0)
Hemoglobin: 14.1 g/dL (ref 13.0–17.7)
MCH: 32.4 pg (ref 26.6–33.0)
MCHC: 34.5 g/dL (ref 31.5–35.7)
MCV: 94 fL (ref 79–97)
Platelets: 229 10*3/uL (ref 150–379)
RBC: 4.35 x10E6/uL (ref 4.14–5.80)
RDW: 13.1 % (ref 12.3–15.4)
WBC: 8.6 10*3/uL (ref 3.4–10.8)

## 2018-02-27 NOTE — Telephone Encounter (Signed)
Patient in office for pre procedure labs today.  Instructions for DCCV Friday reviewed and all questions answered.    Advised patient to call with any further questions or concerns.

## 2018-02-28 ENCOUNTER — Other Ambulatory Visit: Payer: Self-pay | Admitting: Family Medicine

## 2018-02-28 DIAGNOSIS — I10 Essential (primary) hypertension: Secondary | ICD-10-CM

## 2018-02-28 LAB — BASIC METABOLIC PANEL
BUN / CREAT RATIO: 17 (ref 10–24)
BUN: 17 mg/dL (ref 8–27)
CHLORIDE: 96 mmol/L (ref 96–106)
CO2: 25 mmol/L (ref 20–29)
CREATININE: 1 mg/dL (ref 0.76–1.27)
Calcium: 9.2 mg/dL (ref 8.6–10.2)
GFR calc non Af Amer: 67 mL/min/{1.73_m2} (ref >59)
GFR, EST AFRICAN AMERICAN: 78 mL/min/{1.73_m2} (ref >59)
GLUCOSE: 128 mg/dL — AB (ref 65–99)
Potassium: 4.6 mmol/L (ref 3.5–5.2)
SODIUM: 135 mmol/L (ref 134–144)

## 2018-02-28 LAB — PROTIME-INR
INR: 1.2 (ref 0.8–1.2)
Prothrombin Time: 12.3 s — ABNORMAL HIGH (ref 9.1–12.0)

## 2018-03-01 ENCOUNTER — Ambulatory Visit (HOSPITAL_COMMUNITY): Payer: Medicare Other | Admitting: Certified Registered Nurse Anesthetist

## 2018-03-01 ENCOUNTER — Ambulatory Visit (HOSPITAL_COMMUNITY)
Admission: RE | Admit: 2018-03-01 | Discharge: 2018-03-01 | Disposition: A | Discharge status: Home / Self Care | Payer: Medicare Other | Source: Ambulatory Visit | Attending: Cardiovascular Disease | Admitting: Cardiovascular Disease

## 2018-03-01 ENCOUNTER — Encounter (HOSPITAL_COMMUNITY): Payer: Self-pay | Admitting: Certified Registered Nurse Anesthetist

## 2018-03-01 ENCOUNTER — Encounter (HOSPITAL_COMMUNITY)
Admission: RE | Disposition: A | Discharge status: Home / Self Care | Payer: Self-pay | Source: Ambulatory Visit | Attending: Cardiovascular Disease

## 2018-03-01 DIAGNOSIS — Z7901 Long term (current) use of anticoagulants: Secondary | ICD-10-CM | POA: Insufficient documentation

## 2018-03-01 DIAGNOSIS — I340 Nonrheumatic mitral (valve) insufficiency: Secondary | ICD-10-CM | POA: Diagnosis not present

## 2018-03-01 DIAGNOSIS — E785 Hyperlipidemia, unspecified: Secondary | ICD-10-CM | POA: Diagnosis not present

## 2018-03-01 DIAGNOSIS — Z86718 Personal history of other venous thrombosis and embolism: Secondary | ICD-10-CM | POA: Insufficient documentation

## 2018-03-01 DIAGNOSIS — Z79899 Other long term (current) drug therapy: Secondary | ICD-10-CM | POA: Insufficient documentation

## 2018-03-01 DIAGNOSIS — F419 Anxiety disorder, unspecified: Secondary | ICD-10-CM | POA: Diagnosis not present

## 2018-03-01 DIAGNOSIS — I4819 Other persistent atrial fibrillation: Secondary | ICD-10-CM

## 2018-03-01 DIAGNOSIS — Z8546 Personal history of malignant neoplasm of prostate: Secondary | ICD-10-CM | POA: Insufficient documentation

## 2018-03-01 DIAGNOSIS — I10 Essential (primary) hypertension: Secondary | ICD-10-CM | POA: Insufficient documentation

## 2018-03-01 DIAGNOSIS — I481 Persistent atrial fibrillation: Secondary | ICD-10-CM | POA: Diagnosis not present

## 2018-03-01 DIAGNOSIS — I351 Nonrheumatic aortic (valve) insufficiency: Secondary | ICD-10-CM | POA: Diagnosis not present

## 2018-03-01 HISTORY — PX: CARDIOVERSION: SHX1299

## 2018-03-01 SURGERY — CARDIOVERSION
Anesthesia: General

## 2018-03-01 MED ORDER — SODIUM CHLORIDE 0.9 % IV SOLN
INTRAVENOUS | Status: DC | PRN
  Administered 2018-03-01: 11:00:00 via INTRAVENOUS

## 2018-03-01 MED ORDER — LIDOCAINE 2% (20 MG/ML) 5 ML SYRINGE
INTRAMUSCULAR | Status: DC | PRN
  Administered 2018-03-01: 20 mg via INTRAVENOUS

## 2018-03-01 MED ORDER — SODIUM CHLORIDE 0.9 % IV SOLN: INTRAVENOUS | Status: DC

## 2018-03-01 MED ORDER — PROPOFOL 10 MG/ML IV BOLUS
INTRAVENOUS | Status: DC | PRN
  Administered 2018-03-01: 50 mg via INTRAVENOUS

## 2018-03-01 NOTE — CV Procedure (Signed)
  CARDIOVERSION NOTE   Procedure: Electrical Cardioversion Indications:  Atrial Fibrillation  Procedure Details:  Consent: Risks of procedure as well as the alternatives and risks of each were explained to the (patient/caregiver).  Consent for procedure obtained.  Time Out: Verified patient identification, verified procedure, site/side was marked, verified correct patient position, special equipment/implants available, medications/allergies/relevent history reviewed, required imaging and test results available.  Performed  Patient placed on cardiac monitor, pulse oximetry, supplemental oxygen as necessary.  Sedation given: lipdocaine 20 mg; propopfol 50 mg Pacer pads placed anterior and posterior chest.  Cardioverted 1 time(s).  Cardioverted at 120J.  Evaluation: Findings: Post procedure EKG shows: NSR Complications: None Patient did tolerate procedure well.   Troy Sine, MD, [facility] 03/01/2018 12:23 PM

## 2018-03-01 NOTE — Anesthesia Postprocedure Evaluation (Signed)
Anesthesia Post Note  Patient: Mark Stark.  Procedure(s) Performed: CARDIOVERSION (N/A )     Patient location during evaluation: PACU Anesthesia Type: General Level of consciousness: awake and alert Pain management: pain level controlled Vital Signs Assessment: post-procedure vital signs reviewed and stable Respiratory status: spontaneous breathing, nonlabored ventilation, respiratory function stable and patient connected to nasal cannula oxygen Cardiovascular status: blood pressure returned to baseline and stable Postop Assessment: no apparent nausea or vomiting Anesthetic complications: no    Last Vitals:  Vitals:   03/01/18 1047 03/01/18 1230  BP: (!) 144/80 120/79  Pulse: (!) 104 66  Resp: 17 18  Temp: 36.7 C 36.7 C  SpO2: 98% 95%    Last Pain:  Vitals:   03/01/18 1230  TempSrc: Oral                 Tiajuana Amass

## 2018-03-01 NOTE — Transfer of Care (Signed)
Immediate Anesthesia Transfer of Care Note  Patient: Mark Stark.  Procedure(s) Performed: CARDIOVERSION (N/A )  Patient Location: Endoscopy Unit  Anesthesia Type:General  Level of Consciousness: awake, alert  and oriented  Airway & Oxygen Therapy: Patient Spontanous Breathing  Post-op Assessment: Report given to RN, Post -op Vital signs reviewed and stable and Patient moving all extremities X 4  Post vital signs: Reviewed and stable  Last Vitals:  Vitals:   03/01/18 1047  BP: (!) 144/80  Pulse: (!) 104  Resp: 17  Temp: 36.7 C  SpO2: 98%    Last Pain:  Vitals:   03/01/18 1047  TempSrc: Oral         Complications: No apparent anesthesia complications

## 2018-03-01 NOTE — Discharge Instructions (Signed)
Electrical Cardioversion, Care After °This sheet gives you information about how to care for yourself after your procedure. Your health care provider may also give you more specific instructions. If you have problems or questions, contact your health care provider. °What can I expect after the procedure? °After the procedure, it is common to have: °· Some redness on the skin where the shocks were given. ° °Follow these instructions at home: °· Do not drive for 24 hours if you were given a medicine to help you relax (sedative). °· Take over-the-counter and prescription medicines only as told by your health care provider. °· Ask your health care provider how to check your pulse. Check it often. °· Rest for 48 hours after the procedure or as told by your health care provider. °· Avoid or limit your caffeine use as told by your health care provider. °Contact a health care provider if: °· You feel like your heart is beating too quickly or your pulse is not regular. °· You have a serious muscle cramp that does not go away. °Get help right away if: °· You have discomfort in your chest. °· You are dizzy or you feel faint. °· You have trouble breathing or you are short of breath. °· Your speech is slurred. °· You have trouble moving an arm or leg on one side of your body. °· Your fingers or toes turn cold or blue. °This information is not intended to replace advice given to you by your health care provider. Make sure you discuss any questions you have with your health care provider. °Document Released: 10/01/2013 Document Revised: 07/14/2016 Document Reviewed: 06/16/2016 °Elsevier Interactive Patient Education © 2018 [employer] ° °

## 2018-03-01 NOTE — Anesthesia Preprocedure Evaluation (Addendum)
Anesthesia Evaluation  Patient identified by MRN, date of birth, ID band Patient awake    Reviewed: Allergy & Precautions, NPO status , Patient's Chart, lab work & pertinent test results, reviewed documented beta blocker date and time   Airway Mallampati: II  TM Distance: >3 FB Neck ROM: Full    Dental  (+) Teeth Intact, Dental Advisory Given   Pulmonary neg pulmonary ROS,    breath sounds clear to auscultation       Cardiovascular hypertension, Pt. on medications and Pt. on home beta blockers + Valvular Problems/Murmurs MR and AI  Rhythm:Irregular Rate:Normal  EF 55%. Mod AI, Mod MR   Neuro/Psych Anxiety negative neurological ROS     GI/Hepatic negative GI ROS, Neg liver ROS,   Endo/Other  negative endocrine ROS  Renal/GU negative Renal ROS     Musculoskeletal   Abdominal   Peds  Hematology negative hematology ROS (+)   Anesthesia Other Findings   Reproductive/Obstetrics                            Lab Results  Component Value Date   WBC 8.6 02/27/2018   HGB 14.1 02/27/2018   HCT 40.9 02/27/2018   MCV 94 02/27/2018   PLT 229 02/27/2018   Lab Results  Component Value Date   CREATININE 1.00 02/27/2018   BUN 17 02/27/2018   NA 135 02/27/2018   K 4.6 02/27/2018   CL 96 02/27/2018   CO2 25 02/27/2018    Anesthesia Physical Anesthesia Plan  ASA: III  Anesthesia Plan: General   Post-op Pain Management:    Induction: Intravenous  PONV Risk Score and Plan: 2 and Propofol infusion, Ondansetron and Treatment may vary due to age or medical condition  Airway Management Planned: Mask and Natural Airway  Additional Equipment:   Intra-op Plan:   Post-operative Plan:   Informed Consent: I have reviewed the patients History and Physical, chart, labs and discussed the procedure including the risks, benefits and alternatives for the proposed anesthesia with the patient or authorized  representative who has indicated his/her understanding and acceptance.     Plan Discussed with:   Anesthesia Plan Comments:         Anesthesia Quick Evaluation

## 2018-03-01 NOTE — Anesthesia Procedure Notes (Signed)
Procedure Name: General with mask airway Date/Time: 03/01/2018 12:16 PM Performed by: Harden Mo, CRNA Pre-anesthesia Checklist: Patient identified, Emergency Drugs available, Suction available and Patient being monitored Patient Re-evaluated:Patient Re-evaluated prior to induction Oxygen Delivery Method: Ambu bag Preoxygenation: Pre-oxygenation with 100% oxygen Induction Type: IV induction Placement Confirmation: positive ETCO2 and breath sounds checked- equal and bilateral Dental Injury: Teeth and Oropharynx as per pre-operative assessment

## 2018-03-01 NOTE — Interval H&P Note (Signed)
History and Physical Interval Note:  03/01/2018 12:10 PM  Mark Stark.  has presented today for surgery, with the diagnosis of afib  The various methods of treatment have been discussed with the patient and family. After consideration of risks, benefits and other options for treatment, the patient has consented to  Procedure(s): CARDIOVERSION (N/A) as a surgical intervention .  The patient's history has been reviewed, patient examined, no change in status, stable for surgery.  I have reviewed the patient's chart and labs.  Questions were answered to the patient's satisfaction.     Shelva Majestic

## 2018-03-02 ENCOUNTER — Encounter (HOSPITAL_COMMUNITY): Payer: Self-pay | Admitting: Cardiovascular Disease

## 2018-03-03 ENCOUNTER — Other Ambulatory Visit: Payer: Self-pay | Admitting: Family Medicine

## 2018-03-15 ENCOUNTER — Ambulatory Visit (INDEPENDENT_AMBULATORY_CARE_PROVIDER_SITE_OTHER): Payer: Medicare Other | Admitting: Cardiovascular Disease

## 2018-03-15 ENCOUNTER — Encounter: Payer: Self-pay | Admitting: Cardiovascular Disease

## 2018-03-15 VITALS — BP 126/62 | HR 54 | Ht 68.0 in | Wt 160.8 lb

## 2018-03-15 DIAGNOSIS — I10 Essential (primary) hypertension: Secondary | ICD-10-CM

## 2018-03-15 DIAGNOSIS — Z7901 Long term (current) use of anticoagulants: Secondary | ICD-10-CM

## 2018-03-15 DIAGNOSIS — I481 Persistent atrial fibrillation: Secondary | ICD-10-CM | POA: Diagnosis not present

## 2018-03-15 DIAGNOSIS — I4819 Other persistent atrial fibrillation: Secondary | ICD-10-CM

## 2018-03-15 DIAGNOSIS — I351 Nonrheumatic aortic (valve) insufficiency: Secondary | ICD-10-CM

## 2018-03-15 DIAGNOSIS — E785 Hyperlipidemia, unspecified: Secondary | ICD-10-CM | POA: Diagnosis not present

## 2018-03-15 MED ORDER — DOXAZOSIN MESYLATE 4 MG PO TABS: 2.0000 mg | ORAL_TABLET | Freq: Every day | ORAL | 1 refills | Status: DC

## 2018-03-15 NOTE — Progress Notes (Signed)
Cardiology Office Note    Date:  03/15/2018   ID:  [name], DOB 09/14/1928, MRN [id number]  PCP:  [name], DO  Cardiologist:  Shelva Majestic, MD   New cardiology evaluation, referred through the courtesy of Dr. Roma Schanz for evaluation of an irregular rhythm in atrial fibrillation.  History of Present Illness:  Mark Stark. is a 84 y.o. male who was referred by Dr. [name] for atrial fibrillation.  I saw him as an add-on on 01/24/2018 and he presents for reevaluation.  Mr. Soules is a young appearing 84 year old male who remotely had seen Dr. Percival Spanish on one occasion in 2012.  He was noted have a mild slight heart murmur.  Over the last several years he has continued to be active.  He typically walks for 1 hour every morning.  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  On Christmas Eve 2018, he developed a sensation of an irregular rhythm.  His heart rhythm has persisted.  He had seen Dr. Roma Schanz and was found to be in atrial fibrillation.  He was started on eliquis 5 mg twice a day for anticoagulation.  He has been taking metoprolol succinate 100 mg daily, lisinopril 5 mg, and doxazocin 4 mg for hypertension.  He is on simvastatin 20 mg daily for hyperlipidemia.  When I saw him, he had just started to wear a Holter monitor.   His initial strips confirmed persistent atrial fibrillation.  A [facility] service read out from 01/24/2018 at 450:54 AM has revealed a 3.1 second pause while the patient was sleeping. [name]  He denied any chest pain, pesyncope, PND, orthopnea.  He had experience mild fatigue and slight shortness of breath.  He denied issues with sleep.    He was started on eliquis for anticoagulation  on January 06, 2018.  He underwent a 2-D echo Doppler study on 01/21/2018 which showed an EF of 55-60%.  Ascending aorta was upper normal at 40 mm.  He had moderate mitral regurgitation.  His left atrium measured 55 mm was  interpreted as severe dilation.  LA volume was 56 mL's per meter squared. When I last saw himon 02/22/2018, he was maintaining atrial fibrillation and had been on anticoagulatio for 6 weeks.  He underwent successful DC cardioversion on 03/01/2018 and was successfully converted to sinus rhythm with 120 J synchronized countershock.  Socially, he states that his rhythm has remained stable.  He checks his blood pressures regularly.  Times he notes his blood pressure has been on the low side, but his rhythm is always remained regular.  He does note some mild fatigue.  Is now gardening season and he plans to be very active implanting his large garden.  He denies chest pain. [name]  He has resumed exercising. He denies bleeding.  He presents for evaluation  Past Medical History:  Diagnosis Date  . Anxiety   . Diverticulitis   . Dyslipidemia   . Hypertension   . 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 Prostate cancer [facility])     Past Surgical History:  Procedure Laterality Date  . CARDIOVERSION N/A 03/01/2018   Procedure: CARDIOVERSION;  Surgeon: Troy Sine, MD;  Location: [facility] ENDOSCOPY;  Service: Cardiovascular;  Laterality: N/A;  . CATARACT EXTRACTION  01/27/2010   right  . CATARACT EXTRACTION  03/04/2010   left  . Prostate seed implants    . PROSTATE SURGERY  Current Medications: Outpatient Medications Prior to Visit  Medication Sig Dispense Refill  . apixaban (ELIQUIS) 5 MG TABS tablet Take 1 tablet (5 mg total) by mouth 2 (two) times daily. 60 tablet 2  . b complex vitamins tablet Take 1 tablet by mouth daily.      . Calcium Carbonate-Vitamin D (CALTRATE 600+D) 600-400 MG-UNIT per tablet Take 1 tablet by mouth daily.      . clorazepate (TRANXENE) 15 MG tablet Take 1/2 tablet by mouth once daily (Patient taking differently: Take 7.5 mg by mouth daily. ) 15 tablet 1  . cromolyn (NASALCROM) 5.2 MG/ACT nasal spray 2 sprays by Nasal route 2 (two) times daily.      . fenofibrate 160 MG  tablet Take 1 tablet (160 mg total) by mouth daily. 90 tablet 1  . fish oil-omega-3 fatty acids 1000 MG capsule Take 4 g by mouth daily.     [name] lisinopril (PRINIVIL,ZESTRIL) 5 MG tablet TAKE 1 TABLET BY MOUTH EVERY DAY (Patient taking differently: TAKE 10 MG BY MOUTH EVERY DAY) 90 tablet 0  . metoprolol (TOPROL-XL) 200 MG 24 hr tablet TAKE 1/2 A TABLET BY MOUTH EVERY DAY 90 tablet 1  . Multiple Vitamins-Minerals (MULTIVITAMIN PO) Take 1 tablet by mouth daily.    . Multiple Vitamins-Minerals (PRESERVISION AREDS 2) CAPS 2 po qd (Patient taking differently: Take 2 capsules by mouth daily. )    . prednisoLONE acetate (PRED FORTE) 1 % ophthalmic suspension Place 1 drop into the right eye 4 (four) times daily.    . simvastatin (ZOCOR) 20 MG tablet TAKE 1 TABLET BY MOUTH EVERYDAY AT BEDTIME 90 tablet 0  . vitamin C (ASCORBIC ACID) 500 MG tablet Take 500 mg by mouth daily.    [name] doxazosin (CARDURA) 4 MG tablet Take 1 tablet (4 mg total) by mouth at bedtime. 90 tablet 1   No facility-administered medications prior to visit.      Allergies:   Sulfonamide derivatives   Social History   Socioeconomic History  . Marital status: Married    Spouse name: Not on file  . Number of children: Not on file  . Years of education: Not on file  . Highest education level: Not on file  Occupational History  . Occupation: Professor---RETIRED    Employer: RETIRED    Comment: Retired  Scientific laboratory technician  . Financial resource strain: Not on file  . Food insecurity:    Worry: Not on file    Inability: Not on file  . Transportation needs:    Medical: Not on file    Non-medical: Not on file  Tobacco Use  . Smoking status: Never Smoker  . Smokeless tobacco: Never Used  Substance and Sexual Activity  . Alcohol use: No    Comment: Little wine occasionally   . Drug use: No  . Sexual activity: Never    Partners: Female  Lifestyle  . Physical activity:    Days per week: Not on file    Minutes per session: Not on  file  . Stress: Not on file  Relationships  . Social connections:    Talks on phone: Not on file    Gets together: Not on file    Attends religious service: Not on file    Active member of club or organization: Not on file    Attends meetings of clubs or organizations: Not on file    Relationship status: Not on file  Other Topics Concern  . Not on file  Social  History Narrative  . Not on file     Family History:  The patient's family history includes Alzheimer's disease in his mother; Cancer (age of onset: 59) in his brother; Dementia in his mother; Mental illness in his mother.  His father is deceased and had suffered a myocardial infarction and had 3 CVAs.  His mother died and had Alzheimer's disease.  ROS General: Negative; No fevers, chills, or night sweats;  HEENT: Negative; No changes in vision or hearing, sinus congestion, difficulty swallowing Pulmonary: Negative; No cough, wheezing, shortness of breath, hemoptysis Cardiovascular: See history of present illness GI: Negative; No nausea, vomiting, diarrhea, or abdominal pain GU: Negative; No dysuria, hematuria, or difficulty voiding Musculoskeletal: Negative; no myalgias, joint pain, or weakness Hematologic/Oncology: Negative; no easy bruising, bleeding Endocrine: Negative; no heat/cold intolerance; no diabetes Neuro: Negative; no changes in balance, headaches Skin: Negative; No rashes or skin lesions Psychiatric: Negative; No behavioral problems, depression Sleep: Negative; No snoring, daytime sleepiness, hypersomnolence, bruxism, restless legs, hypnogognic hallucinations, no cataplexy Other comprehensive 14 point system review is negative.   PHYSICAL EXAM:   VS:  BP 126/62   Pulse (!) 54   Ht _0  (1.727 m)   Wt 160 lb 12.8 oz (72.9 kg)   BMI 24.45 kg/m     Repeat blood pressure by me was 112 or 64 supine and 116/60 standing  Wt Readings from Last 3 Encounters:  03/15/18 160 lb 12.8 oz (72.9 kg)  02/22/18 168  lb 3.2 oz (76.3 kg)  01/24/18 170 lb (77.1 kg)    General: Alert, oriented, no distress.  Skin: normal turgor, no rashes, warm and dry HEENT: Normocephalic, atraumatic. Pupils equal round and reactive to light; sclera anicteric; extraocular muscles intact;  Nose without nasal septal hypertrophy Mouth/Parynx benign; Mallinpatti scale 3 Neck: No JVD, no carotid bruits; normal carotid upstroke Lungs: clear to ausculatation and percussion; no wheezing or rales Chest wall: without tenderness to palpitation Heart: PMI not displaced, RRR, s1 s2 normal, 1/6 systolic murmur, no diastolic murmur, no rubs, gallops, thrills, or heaves Abdomen: soft, nontender; no hepatosplenomehaly, BS+; abdominal aorta nontender and not dilated by palpation. Back: no CVA tenderness Pulses 2+ Musculoskeletal: full range of motion, normal strength, no joint deformities Extremities: no clubbing cyanosis or edema, Homan's sign negative  Neurologic: grossly nonfocal; Cranial nerves grossly wnl Psychologic: Normal mood and affect   Studies/Labs Reviewed:   ECG (independently read by me): sinus bradycardia 54 bpm.  First degree AV block with PR interval at 246 bili seconds.  No ectopy.  Normal QTc interval.  02/22/2018 EKG:  EKG is ordered today.  Atrial fibrillation at 81 bpm.  QTc interval 397 ms.  01/24/2018 ECG (independently read by me): atrial fibrillation at 73 bpm. QTc interval 387 ms.  No significant ST-T changes.  Recent Labs: BMP Latest Ref Rng & Units 02/27/2018 01/25/2018 01/11/2018  Glucose 65 - 99 mg/dL 128(H) 106(H) 85  BUN 8 - 27 mg/dL _1 Creatinine 0.76 - 1.27 mg/dL 1.00 1.23 1.30(H)  BUN/Creat Ratio 10 - _2 Sodium 134 - 144 mmol/L 135 137 140  Potassium 3.5 - 5.2 mmol/L 4.6 4.5 4.5  Chloride 96 - 106 mmol/L 96 97 103  CO2 20 - 29 mmol/L _3 Calcium 8.6 - 10.2 mg/dL 9.2 9.6 9.3     Hepatic Function Latest Ref Rng & Units 01/11/2018 07/05/2017 12/22/2016  Total Protein 6.1 -  8.1 g/dL 6.4 6.5 6.6  Albumin 3.5 -  5.2 g/dL - 4.1 4.3  AST 10 - 35 U/L _0 ALT 9 - 46 U/L _1 Alk Phosphatase 39 - 117 U/L - 25(L) 32(L)  Total Bilirubin 0.2 - 1.2 mg/dL 1.1 0.8 0.9  Bilirubin, Direct 0.0 - 0.3 mg/dL - - -    CBC Latest Ref Rng & Units 02/27/2018 01/25/2018 12/22/2016  WBC 3.4 - 10.8 x10E3/uL 8.6 4.4 7.7  Hemoglobin 13.0 - 17.7 g/dL 14.1 15.8 14.1  Hematocrit 37.5 - 51.0 % 40.9 45.7 41.2  Platelets 150 - 379 x10E3/uL 229 166 208.0   Lab Results  Component Value Date   MCV 94 02/27/2018   MCV 95 01/25/2018   MCV 95.1 12/22/2016   Lab Results  Component Value Date   TSH 1.830 01/25/2018   Lab Results  Component Value Date   HGBA1C 5.0 05/21/2012     BNP No results found for: BNP  ProBNP No results found for: PROBNP   Lipid Panel     Component Value Date/Time   CHOL 90 01/11/2018 1454   TRIG 52 01/11/2018 1454   HDL 45 01/11/2018 1454   CHOLHDL 2.0 01/11/2018 1454   VLDL 9.6 07/05/2017 1412   LDLCALC 32 01/11/2018 1454     RADIOLOGY: No results found.   Additional studies/ records that were reviewed today include:  I reviewed the prior evaluation.  In 2012.  I reviewed the primary care evaluation from [facility]. I reviewed the rhythm strips which have been taken from the Holter monitor which he is currently wearing.  ------------------------------------------------------------------- 01/21/2018  Echo Study Conclusions  - Left ventricle: The cavity size was normal. There was moderate   concentric hypertrophy. Systolic function was normal. The   estimated ejection fraction was in the range of 55% to 60%. Wall   motion was normal; there were no regional wall motion   abnormalities. The study was not technically sufficient to allow   evaluation of LV diastolic dysfunction due to atrial   fibrillation. - Aortic valve: Valve mobility was restricted. There was moderate   regurgitation. - Ascending aorta: The ascending aorta  was upper normal size   measuring 40 mm. - Mitral valve: There was moderate regurgitation. - Left atrium: The atrium was severely dilated. - Right ventricle: The cavity size was normal. Wall thickness was   normal. Systolic function was normal. - Right atrium: The atrium was mildly dilated. - Tricuspid valve: There was moderate regurgitation. - Pulmonic valve: There was trivial regurgitation. - Pulmonary arteries: Systolic pressure was mildly increased. PA   peak pressure: 38 mm Hg (S). - Inferior vena cava: The vessel was normal in size. - Pericardium, extracardiac: There was no pericardial effusion.   ASSESSMENT:    1. Persistent atrial fibrillation [facility]): Status post successful cardioversion 03/01/2018   2. Essential hypertension   3. Aortic valve insufficiency, etiology of cardiac valve disease unspecified   4. Hyperlipidemia, unspecified hyperlipidemia type   5. Anticoagulation adequate      PLAN:  Mr.Hanisch is a young appearing, active 84 year old gentleman who has a history of hypertension, hyperlipidemia, and developed new onset irregular heart rhythm on Christmas Eve 2018.  He was started on eliquis anticoagulation after seeing Dr. [name] and has been on therapy since 01/06/2018.  His echo Doppler study demonstrated  preserved LV function. [name]  However, he had a dilated left atrium.  In 2012, his LAD was increased at 49 mm, which had further increased on his most recent echo.  He underwent successful outpatient DC cardioversion and has been maintaining sinus rhythm since his procedure on 03/01/2018.  His ECG today shows sinus bradycardia at 54 bpm with first-degree AV block with a PR interval of 246 ms.  There is no ectopy.  There are no ECG changes. He has noticed that his blood pressure has become somewhat low.  I'm hesitant to reduce his beta blocker therapy.  In light of his enlarged left atrium and only 2 weeks following cardioversion.  He will continue Toprol-XL at 100 mg.   I will also continue lisinopril 5 mg daily.  However, I am recommending he reduce his Cardura from 4 mg down to 2 mg at bedtime.  He continues to be on eliquis and is tolerating this well without bleeding.  I encouraged him to resume exercise.  He will start doing more gardening now that the weather is improved.  He continues to be on simvastatin 20 g for hyperlipidemia and LDL was excellent at 32 on present treatment.  I will see him back in 3 months for reevaluation or sooner if problems arise.   Medication Adjustments/Labs and Tests Ordered: Current medicines are reviewed at length with the patient today.  Concerns regarding medicines are outlined above.  Medication changes, Labs and Tests ordered today are listed in the Patient Instructions below. Patient Instructions  MEDICATION INSTRUCTIONS  DECREASE DOXAZOSIN TO 2 MG DAILY ( TAKE 1/2 TABLET OF 4 MG)     Your physician wants you to follow-up in [place]. You will receive a reminder letter in the mail two months in advance. If you don't receive a letter, please call our office to schedule the follow-up appointment.   If you need a refill on your cardiac medications before your next appointment, please call your pharmacy.      Signed, Shelva Majestic, MD  03/15/2018 11:50 AM    [facility] [address], [place], [place], [place]  [address] Phone: [phone]

## 2018-03-15 NOTE — Patient Instructions (Signed)
MEDICATION INSTRUCTIONS  DECREASE DOXAZOSIN TO 2 MG DAILY ( TAKE 1/2 TABLET OF 4 MG)     Your physician wants you to follow-up in [place]. You will receive a reminder letter in the mail two months in advance. If you don't receive a letter, please call our office to schedule the follow-up appointment.   If you need a refill on your cardiac medications before your next appointment, please call your pharmacy.

## 2018-04-01 ENCOUNTER — Other Ambulatory Visit: Payer: Self-pay | Admitting: Family Medicine

## 2018-04-01 DIAGNOSIS — I480 Paroxysmal atrial fibrillation: Secondary | ICD-10-CM

## 2018-04-03 ENCOUNTER — Other Ambulatory Visit: Payer: Self-pay | Admitting: Family Medicine

## 2018-04-03 DIAGNOSIS — F411 Generalized anxiety disorder: Secondary | ICD-10-CM

## 2018-04-04 DIAGNOSIS — Z961 Presence of intraocular lens: Secondary | ICD-10-CM | POA: Diagnosis not present

## 2018-04-04 DIAGNOSIS — H35351 Cystoid macular degeneration, right eye: Secondary | ICD-10-CM | POA: Diagnosis not present

## 2018-04-04 DIAGNOSIS — H35373 Puckering of macula, bilateral: Secondary | ICD-10-CM | POA: Diagnosis not present

## 2018-04-04 DIAGNOSIS — H43813 Vitreous degeneration, bilateral: Secondary | ICD-10-CM | POA: Diagnosis not present

## 2018-04-04 DIAGNOSIS — H353132 Nonexudative age-related macular degeneration, bilateral, intermediate dry stage: Secondary | ICD-10-CM | POA: Diagnosis not present

## 2018-04-05 NOTE — Telephone Encounter (Signed)
[facility] [address] requesting refill for clorazepate  Database ran and is on your desk for review.  Last filled per database: 03/01/18 Last written: 01/30/18 Last ov: 01/11/18 Next ov: 07/12/18 Contract: none UDS: none

## 2018-04-22 ENCOUNTER — Other Ambulatory Visit: Payer: Self-pay | Admitting: Family Medicine

## 2018-05-27 ENCOUNTER — Other Ambulatory Visit: Payer: Self-pay | Admitting: Family Medicine

## 2018-05-31 DIAGNOSIS — C61 Malignant neoplasm of prostate: Secondary | ICD-10-CM | POA: Diagnosis not present

## 2018-06-07 ENCOUNTER — Other Ambulatory Visit: Payer: Self-pay | Admitting: Family Medicine

## 2018-06-07 DIAGNOSIS — F411 Generalized anxiety disorder: Secondary | ICD-10-CM

## 2018-06-07 DIAGNOSIS — C61 Malignant neoplasm of prostate: Secondary | ICD-10-CM | POA: Diagnosis not present

## 2018-06-07 DIAGNOSIS — R351 Nocturia: Secondary | ICD-10-CM | POA: Diagnosis not present

## 2018-06-07 MED ORDER — CLORAZEPATE DIPOTASSIUM 15 MG PO TABS: 7.5000 mg | ORAL_TABLET | Freq: Every day | ORAL | 2 refills | Status: DC

## 2018-06-07 NOTE — Telephone Encounter (Signed)
Database ran 04/05/18  Last written: 04/05/18 Last ov: 01/11/18 Next ov: 07/12/18 Contract: none UDS: none  Do you want to collect when he come in for this drug?

## 2018-06-11 DIAGNOSIS — D229 Melanocytic nevi, unspecified: Secondary | ICD-10-CM | POA: Diagnosis not present

## 2018-06-11 DIAGNOSIS — L814 Other melanin hyperpigmentation: Secondary | ICD-10-CM | POA: Diagnosis not present

## 2018-06-11 DIAGNOSIS — D1801 Hemangioma of skin and subcutaneous tissue: Secondary | ICD-10-CM | POA: Diagnosis not present

## 2018-06-11 DIAGNOSIS — L570 Actinic keratosis: Secondary | ICD-10-CM | POA: Diagnosis not present

## 2018-06-11 DIAGNOSIS — L821 Other seborrheic keratosis: Secondary | ICD-10-CM | POA: Diagnosis not present

## 2018-06-19 ENCOUNTER — Ambulatory Visit (INDEPENDENT_AMBULATORY_CARE_PROVIDER_SITE_OTHER): Payer: Medicare Other | Admitting: Cardiovascular Disease

## 2018-06-19 ENCOUNTER — Encounter: Payer: Self-pay | Admitting: Cardiovascular Disease

## 2018-06-19 VITALS — BP 155/70 | HR 56 | Ht 68.0 in | Wt 159.4 lb

## 2018-06-19 DIAGNOSIS — Z79899 Other long term (current) drug therapy: Secondary | ICD-10-CM | POA: Diagnosis not present

## 2018-06-19 DIAGNOSIS — I480 Paroxysmal atrial fibrillation: Secondary | ICD-10-CM

## 2018-06-19 DIAGNOSIS — I10 Essential (primary) hypertension: Secondary | ICD-10-CM

## 2018-06-19 DIAGNOSIS — Z7901 Long term (current) use of anticoagulants: Secondary | ICD-10-CM | POA: Diagnosis not present

## 2018-06-19 DIAGNOSIS — E785 Hyperlipidemia, unspecified: Secondary | ICD-10-CM

## 2018-06-19 NOTE — Patient Instructions (Addendum)
Medication Instructions:  Monitor blood pressure at home for the next week.  If consistently > 135-140 alternate taking lisinopril 10 mg daily with 15 mg (1 tablet one day, 1.5 tablet the next day, and continue to alternate)  Labwork: Please return for labs in 1 month (BMET)  Our in office lab hours are Monday-Friday 8:00-4:00, closed for lunch 12:45-1:45 pm.  No appointment needed.  Follow-Up: 4 months with Dr. Claiborne Billings  Any Other Special Instructions Will Be Listed Below (If Applicable).     If you need a refill on your cardiac medications before your next appointment, please call your pharmacy.

## 2018-06-19 NOTE — Progress Notes (Signed)
Cardiology Office Note    Date:  06/21/2018   ID:  Mark Riles., DOB 30-Nov-1928, MRN [id number]  PCP:  [name], DO  Cardiologist:  Shelva Majestic, MD   F/U cardiology evaluation, referred through the courtesy of Dr. Roma Schanz for evaluation of an irregular rhythm in atrial fibrillation.  History of Present Illness:  Mark Ohlrich. is a 84 y.o. male who was referred by Dr. [name] for atrial fibrillation.  I saw him as an add-on on 01/24/2018 with initial follow-up on March 15, 2018.  He presents now for follow-up evaluation.  Mark Stark is a young appearing 84 year old male who remotely had seen Dr. Percival Spanish on one occasion in 2012.  He was noted have a mild slight heart murmur.  Over the last several years he has continued to be active.  He typically walks for 1 hour every morning.  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  On Christmas Eve 2018, he developed a sensation of an irregular rhythm.  His heart rhythm has persisted.  He had seen Dr. Roma Schanz and was found to be in atrial fibrillation.  He was started on eliquis 5 mg twice a day for anticoagulation.  He has been taking metoprolol succinate 100 mg daily, lisinopril 5 mg, and doxazocin 4 mg for hypertension.  He is on simvastatin 20 mg daily for hyperlipidemia.  When I saw him, he had just started to wear a Holter monitor.   His initial strips confirmed persistent atrial fibrillation.  A [facility] service read out from 01/24/2018 at 450:54 AM has revealed a 3.1 second pause while the patient was sleeping. [name]  He denied any chest pain, pesyncope, PND, orthopnea.  He had experience mild fatigue and slight shortness of breath.  He denied issues with sleep.    He was started on eliquis for anticoagulation  on January 06, 2018.  He underwent a 2-D echo Doppler study on 01/21/2018 which showed an EF of 55-60%.  Ascending aorta was upper normal at 40 mm.  He had moderate mitral  regurgitation.  His left atrium measured 55 mm was interpreted as severe dilation.  LA volume was 56 mL's per meter squared. When I last saw himon 02/22/2018, he was maintaining atrial fibrillation and had been on anticoagulatio for 6 weeks.  He underwent successful DC cardioversion on 03/01/2018 and was successfully converted to sinus rhythm with 120 J synchronized countershock.  I saw him in follow-up on March 15, 2018, he was maintaining sinus rhythm and was without recurrent awareness of arrhythmia.  His ECG showed sinus bradycardia at 54 bpm with first-degree AV block.    Over the last 3 months he continues to be very very active.  He has a large garden and also pushes a Therapist, art.  He is now been on lisinopril 10 mg, metoprolol 100 mg, and doxazosin 4 mg for hypertension.  He continues to be on Eliquis for anticoagulation.  He is on simvastatin and fenofibrate for mixed hyperlipidemia.  States his blood pressure at home typically runs in the 1 35-1 48 range.  He is unaware of any recurrent arrhythmia.  He presents for reevaluation.  Past Medical History:  Diagnosis Date  . Anxiety   . Diverticulitis   . Dyslipidemia   . Hypertension   . 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 Prostate cancer [facility])     Past Surgical History:  Procedure Laterality Date  .  CARDIOVERSION N/A 03/01/2018   Procedure: CARDIOVERSION;  Surgeon: Troy Sine, MD;  Location: [facility] ENDOSCOPY;  Service: Cardiovascular;  Laterality: N/A;  . CATARACT EXTRACTION  01/27/2010   right  . CATARACT EXTRACTION  03/04/2010   left  . Prostate seed implants    . PROSTATE SURGERY      Current Medications: Outpatient Medications Prior to Visit  Medication Sig Dispense Refill  . b complex vitamins tablet Take 1 tablet by mouth daily.      . Calcium Carbonate-Vitamin D (CALTRATE 600+D) 600-400 MG-UNIT per tablet Take 1 tablet by mouth daily.      . clorazepate (TRANXENE) 15 MG tablet Take 0.5 tablets (7.5 mg total) by  mouth daily. 15 tablet 2  . cromolyn (NASALCROM) 5.2 MG/ACT nasal spray 2 sprays by Nasal route 2 (two) times daily.      [name] doxazosin (CARDURA) 4 MG tablet TAKE 1 TABLET BY MOUTH AT BEDTIME 90 tablet 1  . ELIQUIS 5 MG TABS tablet TAKE 1 TABLET BY MOUTH TWICE A DAY 60 tablet 2  . fenofibrate 160 MG tablet Take 1 tablet (160 mg total) by mouth daily. 90 tablet 1  . fish oil-omega-3 fatty acids 1000 MG capsule Take 4 g by mouth daily.     [name] lisinopril (PRINIVIL,ZESTRIL) 10 MG tablet Take 10 mg by mouth daily.    . metoprolol (TOPROL-XL) 200 MG 24 hr tablet TAKE 1/2 A TABLET BY MOUTH EVERY DAY 90 tablet 1  . Multiple Vitamins-Minerals (MULTIVITAMIN PO) Take 1 tablet by mouth daily.    . Multiple Vitamins-Minerals (PRESERVISION AREDS 2) CAPS 2 po qd (Patient taking differently: Take 2 capsules by mouth daily. )    . simvastatin (ZOCOR) 20 MG tablet TAKE 1 TABLET BY MOUTH EVERYDAY AT BEDTIME 90 tablet 0  . vitamin C (ASCORBIC ACID) 500 MG tablet Take 500 mg by mouth daily.    [name] lisinopril (PRINIVIL,ZESTRIL) 5 MG tablet TAKE 1 TABLET BY MOUTH EVERY DAY (Patient taking differently: TAKE 10 MG BY MOUTH EVERY DAY) 90 tablet 0  . prednisoLONE acetate (PRED FORTE) 1 % ophthalmic suspension Place 1 drop into the right eye 4 (four) times daily.     No facility-administered medications prior to visit.      Allergies:   Sulfonamide derivatives   Social History   Socioeconomic History  . Marital status: Married    Spouse name: Not on file  . Number of children: Not on file  . Years of education: Not on file  . Highest education level: Not on file  Occupational History  . Occupation: Professor---RETIRED    Employer: RETIRED    Comment: Retired  Scientific laboratory technician  . Financial resource strain: Not on file  . Food insecurity:    Worry: Not on file    Inability: Not on file  . Transportation needs:    Medical: Not on file    Non-medical: Not on file  Tobacco Use  . Smoking status: Never Smoker  .  Smokeless tobacco: Never Used  Substance and Sexual Activity  . Alcohol use: No    Comment: Little wine occasionally   . Drug use: No  . Sexual activity: Never    Partners: Female  Lifestyle  . Physical activity:    Days per week: Not on file    Minutes per session: Not on file  . Stress: Not on file  Relationships  . Social connections:    Talks on phone: Not on file    Gets  together: Not on file    Attends religious service: Not on file    Active member of club or organization: Not on file    Attends meetings of clubs or organizations: Not on file    Relationship status: Not on file  Other Topics Concern  . Not on file  Social History Narrative  . Not on file     Family History:  The patient's family history includes Alzheimer's disease in his mother; Cancer (age of onset: 25) in his brother; Dementia in his mother; Mental illness in his mother.  His father is deceased and had suffered a myocardial infarction and had 3 CVAs.  His mother died and had Alzheimer's disease.  ROS General: Negative; No fevers, chills, or night sweats;  HEENT: Negative; No changes in vision or hearing, sinus congestion, difficulty swallowing Pulmonary: Negative; No cough, wheezing, shortness of breath, hemoptysis Cardiovascular: See history of present illness GI: Negative; No nausea, vomiting, diarrhea, or abdominal pain GU: Negative; No dysuria, hematuria, or difficulty voiding Musculoskeletal: Negative; no myalgias, joint pain, or weakness Hematologic/Oncology: Negative; no easy bruising, bleeding Endocrine: Negative; no heat/cold intolerance; no diabetes Neuro: Negative; no changes in balance, headaches Skin: Negative; No rashes or skin lesions Psychiatric: Negative; No behavioral problems, depression Sleep: Negative; No snoring, daytime sleepiness, hypersomnolence, bruxism, restless legs, hypnogognic hallucinations, no cataplexy Other comprehensive 14 point system review is  negative.   PHYSICAL EXAM:   VS:  BP (!) 155/70   Pulse (!) 56   Ht 5' 8"  (1.727 m)   Wt 159 lb 6.4 oz (72.3 kg)   BMI 24.24 kg/m     Repeat blood pressure by me was 148/74  Wt Readings from Last 3 Encounters:  06/19/18 159 lb 6.4 oz (72.3 kg)  03/15/18 160 lb 12.8 oz (72.9 kg)  02/22/18 168 lb 3.2 oz (76.3 kg)    General: Alert, oriented, no distress.  Skin: normal turgor, no rashes, warm and dry HEENT: Normocephalic, atraumatic. Pupils equal round and reactive to light; sclera anicteric; extraocular muscles intact;  Nose without nasal septal hypertrophy Mouth/Parynx benign; Mallinpatti scale 3 Neck: No JVD, no carotid bruits; normal carotid upstroke Lungs: clear to ausculatation and percussion; no wheezing or rales Chest wall: without tenderness to palpitation Heart: PMI not displaced, RRR, s1 s2 normal, 1/6 systolic murmur, no diastolic murmur, no rubs, gallops, thrills, or heaves Abdomen: soft, nontender; no hepatosplenomehaly, BS+; abdominal aorta nontender and not dilated by palpation. Back: no CVA tenderness Pulses 2+ Musculoskeletal: full range of motion, normal strength, no joint deformities Extremities: no clubbing cyanosis or edema, Homan's sign negative  Neurologic: grossly nonfocal; Cranial nerves grossly wnl Psychologic: Normal mood and affect   Studies/Labs Reviewed:   ECG (independently read by me): Sinus bradycardia 56 bpm; first-degree AV block with a PR interval of 236 ms.  No ectopy.  No ST segment changes.  March 15, 2018 ECG (independently read by me): sinus bradycardia 54 bpm.  First degree AV block with PR interval at 246 bili seconds.  No ectopy.  Normal QTc interval.  02/22/2018 EKG:  EKG is ordered today.  Atrial fibrillation at 81 bpm.  QTc interval 397 ms.  01/24/2018 ECG (independently read by me): atrial fibrillation at 73 bpm. QTc interval 387 ms.  No significant ST-T changes.  Recent Labs: BMP Latest Ref Rng & Units 02/27/2018 01/25/2018  01/11/2018  Glucose 65 - 99 mg/dL 128(H) 106(H) 85  BUN 8 - 27 mg/dL 17 21 20   Creatinine 0.76 - 1.27 mg/dL  1.00 1.23 1.30(H)  BUN/Creat Ratio 10 - 24 17 17 15   Sodium 134 - 144 mmol/L 135 137 140  Potassium 3.5 - 5.2 mmol/L 4.6 4.5 4.5  Chloride 96 - 106 mmol/L 96 97 103  CO2 20 - 29 mmol/L 25 23 26   Calcium 8.6 - 10.2 mg/dL 9.2 9.6 9.3     Hepatic Function Latest Ref Rng & Units 01/11/2018 07/05/2017 12/22/2016  Total Protein 6.1 - 8.1 g/dL 6.4 6.5 6.6  Albumin 3.5 - 5.2 g/dL - 4.1 4.3  AST 10 - 35 U/L 22 25 22   ALT 9 - 46 U/L 18 19 15   Alk Phosphatase 39 - 117 U/L - 25(L) 32(L)  Total Bilirubin 0.2 - 1.2 mg/dL 1.1 0.8 0.9  Bilirubin, Direct 0.0 - 0.3 mg/dL - - -    CBC Latest Ref Rng & Units 02/27/2018 01/25/2018 12/22/2016  WBC 3.4 - 10.8 x10E3/uL 8.6 4.4 7.7  Hemoglobin 13.0 - 17.7 g/dL 14.1 15.8 14.1  Hematocrit 37.5 - 51.0 % 40.9 45.7 41.2  Platelets 150 - 379 x10E3/uL 229 166 208.0   Lab Results  Component Value Date   MCV 94 02/27/2018   MCV 95 01/25/2018   MCV 95.1 12/22/2016   Lab Results  Component Value Date   TSH 1.830 01/25/2018   Lab Results  Component Value Date   HGBA1C 5.0 05/21/2012     BNP No results found for: BNP  ProBNP No results found for: PROBNP   Lipid Panel     Component Value Date/Time   CHOL 90 01/11/2018 1454   TRIG 52 01/11/2018 1454   HDL 45 01/11/2018 1454   CHOLHDL 2.0 01/11/2018 1454   VLDL 9.6 07/05/2017 1412   LDLCALC 32 01/11/2018 1454     RADIOLOGY: No results found.   Additional studies/ records that were reviewed today include:  I reviewed the prior evaluation in 2012.  I reviewed the primary care evaluation from [facility]. I reviewed the Holter monitor. ------------------------------------------------------------------- 01/21/2018  Echo Study Conclusions  - Left ventricle: The cavity size was normal. There was moderate   concentric hypertrophy. Systolic function was normal. The   estimated ejection  fraction was in the range of 55% to 60%. Wall   motion was normal; there were no regional wall motion   abnormalities. The study was not technically sufficient to allow   evaluation of LV diastolic dysfunction due to atrial   fibrillation. - Aortic valve: Valve mobility was restricted. There was moderate   regurgitation. - Ascending aorta: The ascending aorta was upper normal size   measuring 40 mm. - Mitral valve: There was moderate regurgitation. - Left atrium: The atrium was severely dilated. - Right ventricle: The cavity size was normal. Wall thickness was   normal. Systolic function was normal. - Right atrium: The atrium was mildly dilated. - Tricuspid valve: There was moderate regurgitation. - Pulmonic valve: There was trivial regurgitation. - Pulmonary arteries: Systolic pressure was mildly increased. PA   peak pressure: 38 mm Hg (S). - Inferior vena cava: The vessel was normal in size. - Pericardium, extracardiac: There was no pericardial effusion.   ASSESSMENT:    1. Paroxysmal atrial fibrillation (HCC)   2. Essential hypertension   3. Medication management   4. Hyperlipidemia, unspecified hyperlipidemia type   5. Anticoagulation adequate     PLAN:  MarkStark is a young appearing, active 84 year old gentleman who has a history of hypertension, hyperlipidemia, and developed new onset irregular heart rhythm on Christmas Eve 2018.  He was started on eliquis anticoagulation after seeing Dr. [name] and has been on therapy since 01/06/2018.  His echo Doppler study demonstrated  preserved LV function.   However, he had a dilated left atrium.  In 2012, his left atrial dimension was increased at 49 mm but this had further increased on his most recent echo in January 2019 at 55 mm. He underwent successful outpatient DC cardioversion and has been maintaining sinus rhythm since his procedure on 03/01/2018.  I saw him in follow-up he was in sinus rhythm but bradycardic at 54 bpm with  first-degree AV block.  He has continued to be on beta-blocker therapy and has not had recurrent atrial fibrillation.  Continues to be on anticoagulation with Eliquis 5 mg twice a day and has been without bleeding.  His blood pressure today is slightly increased despite his dose of lisinopril which is now 10 mg continues to take doxazosin and metoprolol.  I recommended that he monitor his blood pressure closely for the next week.  If his blood pressure consistently is greater then 140, he will alternate lisinopril and take 10 mg alternating with 15 mg every other day since he just had a 10 mg prescription filled.  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  He continues to be on simvastatin and fenofibrate for his mixed hyperlipidemia.  LDL cholesterol was 32 on January 12, 2018 and triglycerides were excellent.  May be possible to discontinue fenofibrate since he continues to take omega-3 fatty acid as well.  I will see him in 4 months for reevaluation or sooner if problems develop.   Medication Adjustments/Labs and Tests Ordered: Current medicines are reviewed at length with the patient today.  Concerns regarding medicines are outlined above.  Medication changes, Labs and Tests ordered today are listed in the Patient Instructions below. Patient Instructions  Medication Instructions:  Monitor blood pressure at home for the next week.  If consistently > 135-140 alternate taking lisinopril 10 mg daily with 15 mg (1 tablet one day, 1.5 tablet the next day, and continue to alternate)  Labwork: Please return for labs in 1 month (BMET)  Our in office lab hours are Monday-Friday 8:00-4:00, closed for lunch 12:45-1:45 pm.  No appointment needed.  Follow-Up: 4 months with Dr. Claiborne Billings  Any Other Special Instructions Will Be Listed Below (If Applicable).     If you need a refill on your cardiac medications before your next appointment, please call your pharmacy.       Signed, Shelva Majestic, MD  06/21/2018 8:16 PM    [facility] [address] [place], [place], [place]  [address] Phone: [phone]

## 2018-06-21 ENCOUNTER — Encounter: Payer: Self-pay | Admitting: Cardiovascular Disease

## 2018-06-24 ENCOUNTER — Other Ambulatory Visit: Payer: Self-pay | Admitting: Family Medicine

## 2018-06-24 DIAGNOSIS — I480 Paroxysmal atrial fibrillation: Secondary | ICD-10-CM

## 2018-06-25 ENCOUNTER — Other Ambulatory Visit: Payer: Self-pay | Admitting: Family Medicine

## 2018-06-25 DIAGNOSIS — I480 Paroxysmal atrial fibrillation: Secondary | ICD-10-CM

## 2018-07-04 ENCOUNTER — Telehealth: Payer: Self-pay | Admitting: Cardiovascular Disease

## 2018-07-04 NOTE — Telephone Encounter (Signed)
Called patient who stated that he was taking 10mg  of lisinopril, Dr.Kelly changed him to 15 mg of Lisinopril. He called in to give his recent BP readings.  Range 124-147/ 60-71   Patient would like to know if he needs to continue on the 15mg , or if Dr.Kelly would like his BP to be any lower.    Patient stated not an urgent issue.

## 2018-07-04 NOTE — Telephone Encounter (Signed)
New Message:       Pt c/o medication issue:  1. Name of Medication: lisinopril (PRINIVIL,ZESTRIL) 10 MG tablet  2. How are you currently taking this medication (dosage and times per day)? Take 10 mg by mouth daily.  3. Are you having a reaction (difficulty breathing--STAT)? No  4. What is your medication issue? Pt is calling and states he was told to start 15 mg of this medication and was told to call and give a report on how it worked.

## 2018-07-08 NOTE — Telephone Encounter (Signed)
With his age of 47, continue the current 15 mg dose

## 2018-07-09 MED ORDER — LISINOPRIL 10 MG PO TABS: 15.0000 mg | ORAL_TABLET | Freq: Every day | ORAL | 3 refills | Status: DC

## 2018-07-09 NOTE — Telephone Encounter (Signed)
Follow up    Pt c/o medication issue:  1. Name of Medication: Lisinopril  2. How are you currently taking this medication (dosage and times per day)? 15mg   3. Are you having a reaction (difficulty breathing--STAT)? No  4. What is your medication issue? Patient wants to know if he should stay on current dosage. I f so, please call prescription to:[id number] - [place], [place]

## 2018-07-09 NOTE — Telephone Encounter (Signed)
Rx(s) sent to pharmacy electronically.  

## 2018-07-10 ENCOUNTER — Telehealth: Payer: Self-pay | Admitting: Cardiovascular Disease

## 2018-07-10 NOTE — Telephone Encounter (Signed)
New Message:       Pt c/o medication issue:  1. Name of Medication: lisinopril (PRINIVIL,ZESTRIL) 10 MG tablet  2. How are you currently taking this medication (dosage and times per day)? 15 mg/ 1x a day  3. Are you having a reaction (difficulty breathing--STAT)? No  4. What is your medication issue? Pt states he is calling and was told to call and give an update on his BP. Pt states he has called before and never received a call back.

## 2018-07-10 NOTE — Telephone Encounter (Signed)
Spoke with pt who states he was calling back in to see what dose of lisinopril he should be taking. Advised that per telephone encounter on 7/11 he is take 15 mg daily. Pt verbalized understanding.  Pt also reports that when he look into a light surfaces for a period of time, he sees blue dots and when he look at a wall far distances it looks like the outer edge of a blue jigsaw puzzle. He does report having macular degenerate and states he doesn't know if symptoms could be related to that or medication. Pt advised to make an appointment with eye doctor and will route to Dr. Claiborne Billings for further recommendations.

## 2018-07-10 NOTE — Telephone Encounter (Signed)
Agree with recommendation.  At present would continue the 15 mg lisinopril and monitor blood pressure

## 2018-07-11 NOTE — Telephone Encounter (Signed)
Left message to call back  

## 2018-07-11 NOTE — Telephone Encounter (Signed)
Follow up  ° °Patient is returning call in reference to medication  °

## 2018-07-11 NOTE — Telephone Encounter (Signed)
Spoke with pt and advised of Dr. Evette Georges recommendation. Pt verbalized understanding.

## 2018-07-12 ENCOUNTER — Ambulatory Visit (INDEPENDENT_AMBULATORY_CARE_PROVIDER_SITE_OTHER): Payer: Medicare Other | Admitting: Family Medicine

## 2018-07-12 ENCOUNTER — Encounter: Payer: Self-pay | Admitting: Family Medicine

## 2018-07-12 VITALS — BP 136/66 | HR 63 | Temp 97.9°F | Resp 16 | Ht 68.0 in | Wt 157.2 lb

## 2018-07-12 DIAGNOSIS — I481 Persistent atrial fibrillation: Secondary | ICD-10-CM | POA: Diagnosis not present

## 2018-07-12 DIAGNOSIS — I4819 Other persistent atrial fibrillation: Secondary | ICD-10-CM

## 2018-07-12 DIAGNOSIS — Z79899 Other long term (current) drug therapy: Secondary | ICD-10-CM | POA: Diagnosis not present

## 2018-07-12 DIAGNOSIS — I10 Essential (primary) hypertension: Secondary | ICD-10-CM | POA: Diagnosis not present

## 2018-07-12 DIAGNOSIS — E785 Hyperlipidemia, unspecified: Secondary | ICD-10-CM | POA: Diagnosis not present

## 2018-07-12 DIAGNOSIS — F411 Generalized anxiety disorder: Secondary | ICD-10-CM | POA: Diagnosis not present

## 2018-07-12 LAB — LIPID PANEL
CHOLESTEROL: 101 mg/dL (ref 0–200)
HDL: 46.2 mg/dL (ref >39.00)
LDL CALC: 45 mg/dL (ref 0–99)
NonHDL: 54.86
TRIGLYCERIDES: 50 mg/dL (ref 0.0–149.0)
Total CHOL/HDL Ratio: 2
VLDL: 10 mg/dL (ref 0.0–40.0)

## 2018-07-12 LAB — COMPREHENSIVE METABOLIC PANEL
ALBUMIN: 4.3 g/dL (ref 3.5–5.2)
ALT: 21 U/L (ref 0–53)
AST: 27 U/L (ref 0–37)
Alkaline Phosphatase: 29 U/L — ABNORMAL LOW (ref 39–117)
BILIRUBIN TOTAL: 1 mg/dL (ref 0.2–1.2)
BUN: 21 mg/dL (ref 6–23)
CALCIUM: 9.4 mg/dL (ref 8.4–10.5)
CHLORIDE: 99 meq/L (ref 96–112)
CO2: 31 mEq/L (ref 19–32)
CREATININE: 1.15 mg/dL (ref 0.40–1.50)
GFR: 63.78 mL/min (ref >60.00)
Glucose, Bld: 97 mg/dL (ref 70–99)
Potassium: 4.2 mEq/L (ref 3.5–5.1)
Sodium: 134 mEq/L — ABNORMAL LOW (ref 135–145)
Total Protein: 6.6 g/dL (ref 6.0–8.3)

## 2018-07-12 NOTE — Progress Notes (Signed)
Patient ID: Mark Koerber., male    DOB: 08-20-1928  Age: 84 y.o. MRN: [id number]    Subjective:  Subjective  HPI Mark Mages. presents for f/u a fib- he is seeing Dr Claiborne Billings and f/u cholesterol and bp.  Pt has no complaints    Review of Systems  Constitutional: Negative for chills and fever.  HENT: Negative for congestion and hearing loss.   Eyes: Negative for discharge.  Respiratory: Negative for cough and shortness of breath.   Cardiovascular: Negative for chest pain, palpitations and leg swelling.  Gastrointestinal: Negative for abdominal pain, blood in stool, constipation, diarrhea, nausea and vomiting.  Genitourinary: Negative for dysuria, frequency, hematuria and urgency.  Musculoskeletal: Negative for back pain and myalgias.  Skin: Negative for rash.  Allergic/Immunologic: Negative for environmental allergies.  Neurological: Negative for dizziness, weakness and headaches.  Hematological: Does not bruise/bleed easily.  Psychiatric/Behavioral: Negative for suicidal ideas. The patient is not nervous/anxious.     History Past Medical History:  Diagnosis Date  . Anxiety   . Diverticulitis   . Dyslipidemia   . Hypertension   . 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 Prostate cancer [facility])     He has a past surgical history that includes Cataract extraction (01/27/2010); Cataract extraction (03/04/2010); Prostate seed implants; Prostate surgery; and Cardioversion (N/A, 03/01/2018).   His family history includes Alzheimer's disease in his mother; Cancer (age of onset: 34) in his brother; Dementia in his mother; Mental illness in his mother.He reports that he has never smoked. He has never used smokeless tobacco. He reports that he does not drink alcohol or use drugs.  Current Outpatient Medications on File Prior to Visit  Medication Sig Dispense Refill  . b complex vitamins tablet Take 1 tablet by mouth daily.      . Calcium Carbonate-Vitamin D (CALTRATE 600+D) 600-400  MG-UNIT per tablet Take 1 tablet by mouth daily.      . clorazepate (TRANXENE) 15 MG tablet Take 0.5 tablets (7.5 mg total) by mouth daily. 15 tablet 2  . cromolyn (NASALCROM) 5.2 MG/ACT nasal spray 2 sprays by Nasal route 2 (two) times daily.      [name] doxazosin (CARDURA) 4 MG tablet TAKE 1 TABLET BY MOUTH AT BEDTIME 90 tablet 1  . ELIQUIS 5 MG TABS tablet TAKE 1 TABLET BY MOUTH TWICE A DAY 60 tablet 2  . fenofibrate 160 MG tablet Take 1 tablet (160 mg total) by mouth daily. 90 tablet 1  . fish oil-omega-3 fatty acids 1000 MG capsule Take 4 g by mouth daily.     [name] lisinopril (PRINIVIL,ZESTRIL) 10 MG tablet Take 1.5 tablets (15 mg total) by mouth daily. 135 tablet 3  . metoprolol (TOPROL-XL) 200 MG 24 hr tablet TAKE 1/2 A TABLET BY MOUTH EVERY DAY 90 tablet 1  . Multiple Vitamins-Minerals (MULTIVITAMIN PO) Take 1 tablet by mouth daily.    . Multiple Vitamins-Minerals (PRESERVISION AREDS 2) CAPS 2 po qd (Patient taking differently: Take 2 capsules by mouth daily. )    . simvastatin (ZOCOR) 20 MG tablet TAKE 1 TABLET BY MOUTH EVERYDAY AT BEDTIME 90 tablet 0  . vitamin C (ASCORBIC ACID) 500 MG tablet Take 500 mg by mouth daily.     No current facility-administered medications on file prior to visit.      Objective:  Objective  Physical Exam  Constitutional: He is oriented to person, place, and time. Vital signs are normal. He appears well-developed and well-nourished. He is sleeping.  HENT:  Head: Normocephalic and atraumatic.  Mouth/Throat: Oropharynx is clear and moist.  Eyes: Pupils are equal, round, and reactive to light. EOM are normal.  Neck: Normal range of motion. Neck supple. No thyromegaly present.  Cardiovascular: Normal rate and regular rhythm.  No murmur heard. Pulmonary/Chest: Effort normal and breath sounds normal. No respiratory distress. He has no wheezes. He has no rales. He exhibits no tenderness.  Musculoskeletal: He exhibits no edema or tenderness.  Neurological: He is  alert and oriented to person, place, and time.  Skin: Skin is warm and dry.  Psychiatric: He has a normal mood and affect. His behavior is normal. Judgment and thought content normal.  Nursing note and vitals reviewed.  BP 136/66 (BP Location: Left Arm, Cuff Size: Normal)   Pulse 63   Temp 97.9 F (36.6 C) (Oral)   Resp 16   Ht 5\' 8"  (1.727 m)   Wt 157 lb 3.2 oz (71.3 kg)   SpO2 97%   BMI 23.90 kg/m  Wt Readings from Last 3 Encounters:  07/12/18 157 lb 3.2 oz (71.3 kg)  06/19/18 159 lb 6.4 oz (72.3 kg)  03/15/18 160 lb 12.8 oz (72.9 kg)     Lab Results  Component Value Date   WBC 8.6 02/27/2018   HGB 14.1 02/27/2018   HCT 40.9 02/27/2018   PLT 229 02/27/2018   GLUCOSE 128 (H) 02/27/2018   CHOL 90 01/11/2018   TRIG 52 01/11/2018   HDL 45 01/11/2018   LDLCALC 32 01/11/2018   ALT 18 01/11/2018   AST 22 01/11/2018   NA 135 02/27/2018   K 4.6 02/27/2018   CL 96 02/27/2018   CREATININE 1.00 02/27/2018   BUN 17 02/27/2018   CO2 25 02/27/2018   TSH 1.830 01/25/2018   PSA 0.35 12/27/2009   INR 1.2 02/27/2018   HGBA1C 5.0 05/21/2012    No results found.   Assessment & Plan:  Plan  I am having Mark Stark. maintain his b complex vitamins, Calcium Carbonate-Vitamin D, fish oil-omega-3 fatty acids, cromolyn, PRESERVISION AREDS 2, fenofibrate, vitamin C, Multiple Vitamins-Minerals (MULTIVITAMIN PO), metoprolol, doxazosin, simvastatin, clorazepate, ELIQUIS, and lisinopril.  No orders of the defined types were placed in this encounter.   Problem List Items Addressed This Visit      Unprioritized   Essential hypertension    Well controlled, no changes to meds. Encouraged heart healthy diet such as the DASH diet and exercise as tolerated.       Hyperlipidemia    Tolerating statin, encouraged heart healthy diet, avoid trans fats, minimize simple carbs and saturated fats. Increase exercise as tolerated      Persistent atrial fibrillation [facility])    Per cardiology         Other Visit Diagnoses    Hyperlipidemia LDL goal <100    -  Primary   Relevant Orders   Lipid panel   Comprehensive metabolic panel   Generalized anxiety disorder       Relevant Orders   Pain Mgmt, Profile 8 w/Conf, U   High risk medication use       Relevant Orders   Pain Mgmt, Profile 8 w/Conf, U      Follow-up: Return in about 6 months (around 01/12/2019), or if symptoms worsen or fail to improve, for fasting, hyperlipidemia, hypertension.  [name], DO

## 2018-07-12 NOTE — Assessment & Plan Note (Signed)
Per cardiology 

## 2018-07-12 NOTE — Assessment & Plan Note (Signed)
Tolerating statin, encouraged heart healthy diet, avoid trans fats, minimize simple carbs and saturated fats. Increase exercise as tolerated 

## 2018-07-12 NOTE — Patient Instructions (Signed)

## 2018-07-12 NOTE — Assessment & Plan Note (Signed)
Well controlled, no changes to meds. Encouraged heart healthy diet such as the DASH diet and exercise as tolerated.  °

## 2018-07-17 LAB — PAIN MGMT, PROFILE 8 W/CONF, U
6 Acetylmorphine: NEGATIVE ng/mL (ref <10)
ALPHAHYDROXYALPRAZOLAM: NEGATIVE ng/mL (ref <25)
ALPHAHYDROXYTRIAZOLAM: NEGATIVE ng/mL (ref <50)
AMINOCLONAZEPAM: NEGATIVE ng/mL (ref <25)
Alcohol Metabolites: NEGATIVE ng/mL (ref <500)
Alphahydroxymidazolam: NEGATIVE ng/mL (ref <50)
Amphetamines: NEGATIVE ng/mL (ref <500)
BENZODIAZEPINES: POSITIVE ng/mL — AB (ref <100)
Buprenorphine, Urine: NEGATIVE ng/mL (ref <5)
COCAINE METABOLITE: NEGATIVE ng/mL (ref <150)
CREATININE: 49.1 mg/dL
Hydroxyethylflurazepam: NEGATIVE ng/mL (ref <50)
LORAZEPAM: NEGATIVE ng/mL (ref <50)
MARIJUANA METABOLITE: NEGATIVE ng/mL (ref <20)
MDA: NEGATIVE ng/mL (ref <200)
MDMA: NEGATIVE ng/mL (ref <200)
MDMA: NEGATIVE ng/mL (ref <500)
Nordiazepam: NEGATIVE ng/mL (ref <50)
Opiates: NEGATIVE ng/mL (ref <100)
Oxazepam: 764 ng/mL — ABNORMAL HIGH (ref <50)
Oxidant: NEGATIVE ug/mL (ref <200)
Oxycodone: NEGATIVE ng/mL (ref <100)
Temazepam: NEGATIVE ng/mL (ref <50)
pH: 6.95 (ref 4.5–9.0)

## 2018-07-18 DIAGNOSIS — I10 Essential (primary) hypertension: Secondary | ICD-10-CM | POA: Diagnosis not present

## 2018-07-18 DIAGNOSIS — Z79899 Other long term (current) drug therapy: Secondary | ICD-10-CM | POA: Diagnosis not present

## 2018-07-18 LAB — BASIC METABOLIC PANEL
BUN/Creatinine Ratio: 19 (ref 10–24)
BUN: 20 mg/dL (ref 8–27)
CHLORIDE: 98 mmol/L (ref 96–106)
CO2: 25 mmol/L (ref 20–29)
Calcium: 9.2 mg/dL (ref 8.6–10.2)
Creatinine, Ser: 1.08 mg/dL (ref 0.76–1.27)
GFR calc Af Amer: 70 mL/min/{1.73_m2} (ref >59)
GFR, EST NON AFRICAN AMERICAN: 61 mL/min/{1.73_m2} (ref >59)
GLUCOSE: 89 mg/dL (ref 65–99)
POTASSIUM: 4.8 mmol/L (ref 3.5–5.2)
SODIUM: 135 mmol/L (ref 134–144)

## 2018-07-19 ENCOUNTER — Other Ambulatory Visit: Payer: Self-pay | Admitting: Family Medicine

## 2018-07-19 DIAGNOSIS — I10 Essential (primary) hypertension: Secondary | ICD-10-CM

## 2018-08-08 DIAGNOSIS — H35351 Cystoid macular degeneration, right eye: Secondary | ICD-10-CM | POA: Diagnosis not present

## 2018-08-08 DIAGNOSIS — Z961 Presence of intraocular lens: Secondary | ICD-10-CM | POA: Diagnosis not present

## 2018-08-08 DIAGNOSIS — H35373 Puckering of macula, bilateral: Secondary | ICD-10-CM | POA: Diagnosis not present

## 2018-08-08 DIAGNOSIS — H43813 Vitreous degeneration, bilateral: Secondary | ICD-10-CM | POA: Diagnosis not present

## 2018-08-08 DIAGNOSIS — H353132 Nonexudative age-related macular degeneration, bilateral, intermediate dry stage: Secondary | ICD-10-CM | POA: Diagnosis not present

## 2018-08-27 ENCOUNTER — Other Ambulatory Visit: Payer: Self-pay | Admitting: *Deleted

## 2018-08-27 MED ORDER — SIMVASTATIN 20 MG PO TABS: ORAL_TABLET | ORAL | 1 refills | Status: DC

## 2018-08-30 ENCOUNTER — Other Ambulatory Visit: Payer: Self-pay | Admitting: Family Medicine

## 2018-09-12 ENCOUNTER — Telehealth: Payer: Self-pay | Admitting: *Deleted

## 2018-09-12 ENCOUNTER — Other Ambulatory Visit: Payer: Self-pay | Admitting: Family Medicine

## 2018-09-12 DIAGNOSIS — F411 Generalized anxiety disorder: Secondary | ICD-10-CM

## 2018-09-12 MED ORDER — CLORAZEPATE DIPOTASSIUM 15 MG PO TABS: 7.5000 mg | ORAL_TABLET | Freq: Every day | ORAL | 2 refills | Status: DC

## 2018-09-12 NOTE — Telephone Encounter (Signed)
[facility] [address] faxed a request over for clorazepate  Database ran and is on your desk for review.  Last filled per database: 08/09/18 Last written: 06/07/18 Last ov: 07/12/18 Next ov: 01/14/19 Contract: 07/13/19 UDS: 07/13/19

## 2018-09-12 NOTE — Telephone Encounter (Signed)
Sent in

## 2018-09-20 ENCOUNTER — Other Ambulatory Visit: Payer: Self-pay | Admitting: Family Medicine

## 2018-09-20 DIAGNOSIS — I480 Paroxysmal atrial fibrillation: Secondary | ICD-10-CM

## 2018-10-17 ENCOUNTER — Other Ambulatory Visit: Payer: Self-pay | Admitting: Family Medicine

## 2018-10-21 ENCOUNTER — Ambulatory Visit (INDEPENDENT_AMBULATORY_CARE_PROVIDER_SITE_OTHER): Payer: Medicare Other | Admitting: Cardiovascular Disease

## 2018-10-21 ENCOUNTER — Encounter: Payer: Self-pay | Admitting: Cardiovascular Disease

## 2018-10-21 VITALS — BP 140/70 | HR 57 | Ht 68.0 in | Wt 161.4 lb

## 2018-10-21 DIAGNOSIS — Z7901 Long term (current) use of anticoagulants: Secondary | ICD-10-CM

## 2018-10-21 DIAGNOSIS — E782 Mixed hyperlipidemia: Secondary | ICD-10-CM

## 2018-10-21 DIAGNOSIS — I10 Essential (primary) hypertension: Secondary | ICD-10-CM | POA: Diagnosis not present

## 2018-10-21 DIAGNOSIS — I480 Paroxysmal atrial fibrillation: Secondary | ICD-10-CM

## 2018-10-21 MED ORDER — LISINOPRIL 20 MG PO TABS: 20.0000 mg | ORAL_TABLET | Freq: Every day | ORAL | 3 refills | Status: DC

## 2018-10-21 NOTE — Patient Instructions (Signed)
Medication Instructions:  INCREASE lisinopril to 20 mg daily  If you need a refill on your cardiac medications before your next appointment, please call your pharmacy.   Follow-Up: At [facility], you and your health needs are our priority.  As part of our continuing mission to provide you with exceptional heart care, we have created designated Provider Care Teams.  These Care Teams include your primary Cardiologist (physician) and Advanced Practice Providers (APPs -  Physician Assistants and Nurse Practitioners) who all work together to provide you with the care you need, when you need it. You will need a follow up appointment in 4-6 months.  Please call our office 2 months in advance to schedule this appointment.  You may see Shelva Majestic, MD or one of the following Advanced Practice Providers on your designated Care Team: [place], [place] . Fabian Sharp, PA-C

## 2018-10-21 NOTE — Progress Notes (Signed)
Cardiology Office Note    Date:  10/21/2018   ID:  Mark Riles., DOB September 11, 1928, MRN [id number]  PCP:  [name], DO  Cardiologist:  Shelva Majestic, MD   F/U cardiology evaluation, referred through the courtesy of Dr. Roma Schanz for evaluation of an irregular rhythm in atrial fibrillation.  History of Present Illness:  Mark Cordrey. is a 84 y.o. male who was referred by Dr. [name] for atrial fibrillation.  I saw him as an add-on on 01/24/2018.  He has been seen on several occasions since and now presents for a four-month follow-up evaluation.  Mark Stark is a young appearing 84 year old male who remotely had seen Dr. Percival Stark on one occasion in 2012.  He was noted have a mild slight heart murmur.  Over the last several years he has continued to be active.  He typically walks for 1 hour every morning.  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  On Christmas Eve 2018, he developed a sensation of an irregular rhythm.  His heart rhythm has persisted.  He had seen Dr. Roma Schanz and was found to be in atrial fibrillation.  He was started on eliquis 5 mg twice a day for anticoagulation.  He has been taking metoprolol succinate 100 mg daily, lisinopril 5 mg, and doxazocin 4 mg for hypertension.  He is on simvastatin 20 mg daily for hyperlipidemia.  When I saw him, he had just started to wear a Holter monitor.   His initial strips confirmed persistent atrial fibrillation.  A [employer] service read out from 01/24/2018 at 450:54 AM has revealed a 3.1 second pause while the patient was sleeping. [name]  He denied any chest pain, pesyncope, PND, orthopnea.  He had experience mild fatigue and slight shortness of breath.  He denied issues with sleep.    He was started on eliquis for anticoagulation  on January 06, 2018.  He underwent a 2-D echo Doppler study on 01/21/2018 which showed an EF of 55-60%.  Ascending aorta was upper normal at 40 mm.  He had  moderate mitral regurgitation.  His left atrium measured 55 mm was interpreted as severe dilation.  LA volume was 56 mL's per meter squared. When I last saw himon 02/22/2018, he was maintaining atrial fibrillation and had been on anticoagulatio for 6 weeks.  He underwent successful DC cardioversion on 03/01/2018 and was successfully converted to sinus rhythm with 120 J synchronized countershock.  I saw him in follow-up on March 15, 2018, he was maintaining sinus rhythm and was without recurrent awareness of arrhythmia.  His ECG showed sinus bradycardia at 54 bpm with first-degree AV block.    When I last saw him in June 2019 he was remaining stable and was unaware of any recurrent atrial fibrillation.  He continues to be active working in a large garden and also pushing a Therapist, art.  Over the past several months, he has been on a regimen consisting of lisinopril 15 mg, metoprolol 100 mg daily, doxazosin 4 mg at bedtime.  He has been on fenofibrate 160 mg and simvastatin 20 mg for hyperlipidemia.  He has continued to be on Eliquis 5 mg twice a day.  He had sent me a letter last month which stated that his blood pressures at home were ranging between 116 and 148 with a median of 133.  His pulse was 59 and diastolic pressures were in the 60s to 70s.  He continues to be doing well and denies recurrent palpitations or awareness of atrial fibrillation.  He denies bleeding.  He presents for reevaluation   Past Medical History:  Diagnosis Date  . Anxiety   . Diverticulitis   . Dyslipidemia   . Hypertension   . 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 Prostate cancer [facility])     Past Surgical History:  Procedure Laterality Date  . CARDIOVERSION N/A 03/01/2018   Procedure: CARDIOVERSION;  Surgeon: Troy Sine, MD;  Location: [facility] ENDOSCOPY;  Service: Cardiovascular;  Laterality: N/A;  . CATARACT EXTRACTION  01/27/2010   right  . CATARACT EXTRACTION  03/04/2010   left  . Prostate seed implants    .  PROSTATE SURGERY      Current Medications: Outpatient Medications Prior to Visit  Medication Sig Dispense Refill  . apixaban (ELIQUIS) 5 MG TABS tablet Take 1 tablet (5 mg total) by mouth 2 (two) times daily. 60 tablet 3  . b complex vitamins tablet Take 1 tablet by mouth daily.      . Calcium Carbonate-Vitamin D (CALTRATE 600+D) 600-400 MG-UNIT per tablet Take 1 tablet by mouth daily.      . clorazepate (TRANXENE) 15 MG tablet Take 0.5 tablets (7.5 mg total) by mouth daily. 15 tablet 2  . cromolyn (NASALCROM) 5.2 MG/ACT nasal spray 2 sprays by Nasal route 2 (two) times daily.      [name] doxazosin (CARDURA) 4 MG tablet TAKE 1 TABLET BY MOUTH EVERYDAY AT BEDTIME 90 tablet 1  . fenofibrate 160 MG tablet TAKE 1 TABLET BY MOUTH EVERY DAY 90 tablet 1  . fish oil-omega-3 fatty acids 1000 MG capsule Take 4 g by mouth daily.     . metoprolol (TOPROL-XL) 200 MG 24 hr tablet TAKE 1/2 A TABLET BY MOUTH EVERY DAY 90 tablet 1  . Multiple Vitamins-Minerals (MULTIVITAMIN PO) Take 1 tablet by mouth daily.    . Multiple Vitamins-Minerals (PRESERVISION AREDS 2) CAPS 2 po qd (Patient taking differently: Take 2 capsules by mouth daily. )    . simvastatin (ZOCOR) 20 MG tablet TAKE 1 TABLET BY MOUTH EVERYDAY AT BEDTIME 90 tablet 1  . vitamin C (ASCORBIC ACID) 500 MG tablet Take 500 mg by mouth daily.    [name] lisinopril (PRINIVIL,ZESTRIL) 10 MG tablet Take 1.5 tablets (15 mg total) by mouth daily. 135 tablet 3   No facility-administered medications prior to visit.      Allergies:   Sulfonamide derivatives   Social History   Socioeconomic History  . Marital status: Married    Spouse name: Not on file  . Number of children: Not on file  . Years of education: Not on file  . Highest education level: Not on file  Occupational History  . Occupation: Professor---RETIRED    Employer: RETIRED    Comment: Retired  Scientific laboratory technician  . Financial resource strain: Not on file  . Food insecurity:    Worry: Not on file     Inability: Not on file  . Transportation needs:    Medical: Not on file    Non-medical: Not on file  Tobacco Use  . Smoking status: Never Smoker  . Smokeless tobacco: Never Used  Substance and Sexual Activity  . Alcohol use: No    Comment: Little wine occasionally   . Drug use: No  . Sexual activity: Never    Partners: Female  Lifestyle  . Physical activity:    Days per week: Not on file  Minutes per session: Not on file  . Stress: Not on file  Relationships  . Social connections:    Talks on phone: Not on file    Gets together: Not on file    Attends religious service: Not on file    Active member of club or organization: Not on file    Attends meetings of clubs or organizations: Not on file    Relationship status: Not on file  Other Topics Concern  . Not on file  Social History Narrative  . Not on file     Family History:  The patient's family history includes Alzheimer's disease in his mother; Cancer (age of onset: 49) in his brother; Dementia in his mother; Mental illness in his mother.  His father is deceased and had suffered a myocardial infarction and had 3 CVAs.  His mother died and had Alzheimer's disease.  ROS General: Negative; No fevers, chills, or night sweats;  HEENT: Negative; No changes in vision or hearing, sinus congestion, difficulty swallowing Pulmonary: Negative; No cough, wheezing, shortness of breath, hemoptysis Cardiovascular: See history of present illness GI: Negative; No nausea, vomiting, diarrhea, or abdominal pain GU: Negative; No dysuria, hematuria, or difficulty voiding Musculoskeletal: Negative; no myalgias, joint pain, or weakness Hematologic/Oncology: Negative; no easy bruising, bleeding Endocrine: Negative; no heat/cold intolerance; no diabetes Neuro: Negative; no changes in balance, headaches Skin: Negative; No rashes or skin lesions Psychiatric: Negative; No behavioral problems, depression Sleep: Negative; No snoring, daytime  sleepiness, hypersomnolence, bruxism, restless legs, hypnogognic hallucinations, no cataplexy Other comprehensive 14 point system review is negative.   PHYSICAL EXAM:   VS:  BP 140/70   Pulse (!) 57   Ht 5' 8"  (1.727 m)   Wt 161 lb 6.4 oz (73.2 kg)   BMI 24.54 kg/m     Repeat blood pressure by me was 148/72  Wt Readings from Last 3 Encounters:  10/21/18 161 lb 6.4 oz (73.2 kg)  07/12/18 157 lb 3.2 oz (71.3 kg)  06/19/18 159 lb 6.4 oz (72.3 kg)    General: Alert, oriented, no distress.  Skin: normal turgor, no rashes, warm and dry HEENT: Normocephalic, atraumatic. Pupils equal round and reactive to light; sclera anicteric; extraocular muscles intact;  Nose without nasal septal hypertrophy Mouth/Parynx benign; Mallinpatti scale 3 Neck: No JVD, no carotid bruits; normal carotid upstroke Lungs: clear to ausculatation and percussion; no wheezing or rales Chest wall: without tenderness to palpitation Heart: PMI not displaced, RRR, s1 s2 normal, 1/6 systolic murmur, no diastolic murmur, no rubs, gallops, thrills, or heaves Abdomen: soft, nontender; no hepatosplenomehaly, BS+; abdominal aorta nontender and not dilated by palpation. Back: no CVA tenderness Pulses 2+ Musculoskeletal: full range of motion, normal strength, no joint deformities Extremities: no clubbing cyanosis or edema, Homan's sign negative  Neurologic: grossly nonfocal; Cranial nerves grossly wnl Psychologic: Normal mood and affect   Studies/Labs Reviewed:   ECG (independently read by me): Sinus bradycardia 56 bpm; first-degree AV block with a PR interval of 236 ms.  No ectopy.  No ST segment changes.  March 15, 2018 ECG (independently read by me): sinus bradycardia 54 bpm.  First degree AV block with PR interval at 246 bili seconds.  No ectopy.  Normal QTc interval.  02/22/2018 EKG:  EKG is ordered today.  Atrial fibrillation at 81 bpm.  QTc interval 397 ms.  01/24/2018 ECG (independently read by me): atrial  fibrillation at 73 bpm. QTc interval 387 ms.  No significant ST-T changes.  Recent Labs: BMP Latest Ref  Rng & Units 07/18/2018 07/12/2018 02/27/2018  Glucose 65 - 99 mg/dL 89 97 128(H)  BUN 8 - 27 mg/dL 20 21 17   Creatinine 0.76 - 1.27 mg/dL 1.08 1.15 1.00  BUN/Creat Ratio 10 - 24 19 - 17  Sodium 134 - 144 mmol/L 135 134(L) 135  Potassium 3.5 - 5.2 mmol/L 4.8 4.2 4.6  Chloride 96 - 106 mmol/L 98 99 96  CO2 20 - 29 mmol/L 25 31 25   Calcium 8.6 - 10.2 mg/dL 9.2 9.4 9.2     Hepatic Function Latest Ref Rng & Units 07/12/2018 01/11/2018 07/05/2017  Total Protein 6.0 - 8.3 g/dL 6.6 6.4 6.5  Albumin 3.5 - 5.2 g/dL 4.3 - 4.1  AST 0 - 37 U/L 27 22 25   ALT 0 - 53 U/L 21 18 19   Alk Phosphatase 39 - 117 U/L 29(L) - 25(L)  Total Bilirubin 0.2 - 1.2 mg/dL 1.0 1.1 0.8  Bilirubin, Direct 0.0 - 0.3 mg/dL - - -    CBC Latest Ref Rng & Units 02/27/2018 01/25/2018 12/22/2016  WBC 3.4 - 10.8 x10E3/uL 8.6 4.4 7.7  Hemoglobin 13.0 - 17.7 g/dL 14.1 15.8 14.1  Hematocrit 37.5 - 51.0 % 40.9 45.7 41.2  Platelets 150 - 379 x10E3/uL 229 166 208.0   Lab Results  Component Value Date   MCV 94 02/27/2018   MCV 95 01/25/2018   MCV 95.1 12/22/2016   Lab Results  Component Value Date   TSH 1.830 01/25/2018   Lab Results  Component Value Date   HGBA1C 5.0 05/21/2012     BNP No results found for: BNP  ProBNP No results found for: PROBNP   Lipid Panel     Component Value Date/Time   CHOL 101 07/12/2018 1329   TRIG 50.0 07/12/2018 1329   HDL 46.20 07/12/2018 1329   CHOLHDL 2 07/12/2018 1329   VLDL 10.0 07/12/2018 1329   LDLCALC 45 07/12/2018 1329   LDLCALC 32 01/11/2018 1454     RADIOLOGY: No results found.   Additional studies/ records that were reviewed today include:  I reviewed the prior evaluation in 2012.  I reviewed the primary care evaluation from [facility]. I reviewed the Holter monitor. ------------------------------------------------------------------- 01/21/2018  Echo Study  Conclusions  - Left ventricle: The cavity size was normal. There was moderate   concentric hypertrophy. Systolic function was normal. The   estimated ejection fraction was in the range of 55% to 60%. Wall   motion was normal; there were no regional wall motion   abnormalities. The study was not technically sufficient to allow   evaluation of LV diastolic dysfunction due to atrial   fibrillation. - Aortic valve: Valve mobility was restricted. There was moderate   regurgitation. - Ascending aorta: The ascending aorta was upper normal size   measuring 40 mm. - Mitral valve: There was moderate regurgitation. - Left atrium: The atrium was severely dilated. - Right ventricle: The cavity size was normal. Wall thickness was   normal. Systolic function was normal. - Right atrium: The atrium was mildly dilated. - Tricuspid valve: There was moderate regurgitation. - Pulmonic valve: There was trivial regurgitation. - Pulmonary arteries: Systolic pressure was mildly increased. PA   peak pressure: 38 mm Hg (S). - Inferior vena cava: The vessel was normal in size. - Pericardium, extracardiac: There was no pericardial effusion.   ASSESSMENT:    1. Paroxysmal atrial fibrillation (HCC)   2. Essential hypertension   3. Mixed hyperlipidemia   4. Anticoagulation adequate  PLAN:  MarkStark is a young appearing, active 84 year old gentleman who has a history of hypertension, hyperlipidemia, and developed new onset irregular heart rhythm on Christmas Eve 2018.  He was started on eliquis anticoagulation after seeing Dr. [name] and has been on therapy since 01/06/2018.  His echo Doppler study demonstrated  preserved LV function.   However, he had a dilated left atrium.  In 2012, his left atrial dimension was increased at 49 mm but this had further increased on his most recent echo in January 2019 at 55 mm. He underwent successful outpatient DC cardioversion and has been maintaining sinus rhythm  since his procedure on 03/01/2018.  When I last saw him, since his blood pressure was elevated lisinopril was further titrated and most recently he has been taking 15 mg daily in addition to metoprolol 100 mg and his doxazosin 4 mg.  Repeat blood pressure by me today was 148/72.  I will further titrate lisinopril to 20 mg daily.  He continues to be on fenofibrate and simvastatin for mixed hyperlipidemia.  In July 2019 lipid studies were excellent with total cholesterol 101, HDL 46, LDL 45, and triglycerides 50.  His weight is stable with a BMI of 24.5.  He continues to be active.  He is tolerating Eliquis at 5 mg twice a day and is without bleeding.  Creatinine is 1.01.  He will be seeing his primary doctor in December repeat laboratory will be obtained.  I will see him in 4 to 6 months for reevaluation.   Medication Adjustments/Labs and Tests Ordered: Current medicines are reviewed at length with the patient today.  Concerns regarding medicines are outlined above.  Medication changes, Labs and Tests ordered today are listed in the Patient Instructions below. Patient Instructions  Medication Instructions:  INCREASE lisinopril to 20 mg daily  If you need a refill on your cardiac medications before your next appointment, please call your pharmacy.   Follow-Up: At [facility], you and your health needs are our priority.  As part of our continuing mission to provide you with exceptional heart care, we have created designated Provider Care Teams.  These Care Teams include your primary Cardiologist (physician) and Advanced Practice Providers (APPs -  Physician Assistants and Nurse Practitioners) who all work together to provide you with the care you need, when you need it. You will need a follow up appointment in 4-6 months.  Please call our office 2 months in advance to schedule this appointment.  You may see Shelva Majestic, MD or one of the following Advanced Practice Providers on your designated Care  Team: [place], [place] . Fabian Sharp, PA-C       Signed, Shelva Majestic, MD  10/21/2018 12:07 PM    [place] [address] [place], [place], [place]  [address] Phone: [phone]

## 2018-11-29 DIAGNOSIS — C61 Malignant neoplasm of prostate: Secondary | ICD-10-CM | POA: Diagnosis not present

## 2018-12-05 DIAGNOSIS — C61 Malignant neoplasm of prostate: Secondary | ICD-10-CM | POA: Diagnosis not present

## 2018-12-05 DIAGNOSIS — R351 Nocturia: Secondary | ICD-10-CM | POA: Diagnosis not present

## 2018-12-17 ENCOUNTER — Telehealth: Payer: Self-pay | Admitting: *Deleted

## 2018-12-17 DIAGNOSIS — F411 Generalized anxiety disorder: Secondary | ICD-10-CM

## 2018-12-17 NOTE — Telephone Encounter (Signed)
Request for clorazepate  Database ran and is on your desk for review.  Last filled per database: 11/13/18 Last written: 09/12/18 Last ov: 10/21/18 Next ov:01/14/19 Contract: 07/13/19 UDS: 07/13/19

## 2018-12-19 MED ORDER — CLORAZEPATE DIPOTASSIUM 15 MG PO TABS: 7.5000 mg | ORAL_TABLET | Freq: Every day | ORAL | 2 refills | Status: DC

## 2018-12-19 NOTE — Telephone Encounter (Signed)
Reviewed database. Refilled on behalf of reg PCP.

## 2018-12-27 ENCOUNTER — Telehealth: Payer: Self-pay

## 2018-12-27 NOTE — Telephone Encounter (Signed)
Patient sent in mail to [facility] regarding his BP issues. Dr.Kelly increased his Cardura back to original dose of 4 mg as patient was originally only taking 2 mg. Patient was called and notified, patient had no questions or concerns at this time.

## 2019-01-07 ENCOUNTER — Other Ambulatory Visit: Payer: Self-pay | Admitting: Family Medicine

## 2019-01-07 DIAGNOSIS — I480 Paroxysmal atrial fibrillation: Secondary | ICD-10-CM

## 2019-01-13 ENCOUNTER — Ambulatory Visit: Payer: Medicare Other | Admitting: Family Medicine

## 2019-01-13 NOTE — Progress Notes (Signed)
Subjective:   Mark Stark. is a 85 y.o. male who presents for Medicare Annual/Subsequent preventive examination.  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 Lost wife of 60+ years 3 years ago. Reports great social support.  Review of Systems: No ROS.  Medicare Wellness Visit. Additional risk factors are reflected in the social history. Cardiac Risk Factors include: advanced age (>27men, >73 women);dyslipidemia;hypertension;male gender Sleep patterns: sleeps very well. Home Safety/Smoke Alarms: Feels safe in home. Smoke alarms in place.  Lives alone in 1 story home. Step over tub with grab bar.  Male:    PSA-  Lab Results  Component Value Date   PSA 0.35 12/27/2009   PSA 0.38 09/22/2009       Objective:    Vitals: BP 140/76 (BP Location: Left Arm, Patient Position: Sitting, Cuff Size: Normal)   Pulse 60   Ht 5\' 10"  (1.778 m)   Wt 159 lb 6.4 oz (72.3 kg)   SpO2 98%   BMI 22.87 kg/m   Body mass index is 22.87 kg/m.  Advanced Directives 01/14/2019 03/01/2018 12/22/2016  Does Patient Have a Medical Advance Directive? Yes No Yes  Type of Paramedic of [place];Living will - Living will;Healthcare Power of Attorney  Does patient want to make changes to medical advance directive? No - Patient declined - No - Patient declined  Copy of [place] in Chart? Yes - validated most recent copy scanned in chart (See row information) - Yes  Would patient like information on creating a medical advance directive? - No - Patient declined -    Tobacco Social History   Tobacco Use  Smoking Status Never Smoker  Smokeless Tobacco Never Used     Counseling given: Not Answered   Clinical Intake:     Pain : No/denies pain                 Past Medical History:  Diagnosis Date  . Anxiety   . Diverticulitis   . Dyslipidemia   . Hypertension   . 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 Prostate cancer [facility])     Past Surgical History:  Procedure Laterality Date  . CARDIOVERSION N/A 03/01/2018   Procedure: CARDIOVERSION;  Surgeon: Troy Sine, MD;  Location: [facility] ENDOSCOPY;  Service: Cardiovascular;  Laterality: N/A;  . CATARACT EXTRACTION  01/27/2010   right  . CATARACT EXTRACTION  03/04/2010   left  . Prostate seed implants    . PROSTATE SURGERY     Family History  Problem Relation Age of Onset  . Dementia Mother   . Alzheimer's disease Mother   . Mental illness Mother        ALZ DISEASE  . Cancer Brother 75       pancreatic cancer   Social History   Socioeconomic History  . Marital status: Married    Spouse name: Not on file  . Number of children: Not on file  . Years of education: Not on file  . Highest education level: Not on file  Occupational History  . Occupation: Professor---RETIRED    Employer: RETIRED    Comment: Retired  Scientific laboratory technician  . Financial resource strain: Not on file  . Food insecurity:    Worry: Not on file    Inability: Not on file  . Transportation needs:    Medical: Not on file    Non-medical: Not on file  Tobacco Use  . Smoking status: Never Smoker  .  Smokeless tobacco: Never Used  Substance and Sexual Activity  . Alcohol use: No    Comment: Little wine occasionally   . Drug use: No  . Sexual activity: Never    Partners: Female  Lifestyle  . Physical activity:    Days per week: Not on file    Minutes per session: Not on file  . Stress: Not on file  Relationships  . Social connections:    Talks on phone: Not on file    Gets together: Not on file    Attends religious service: Not on file    Active member of club or organization: Not on file    Attends meetings of clubs or organizations: Not on file    Relationship status: Not on file  Other Topics Concern  . Not on file  Social History Narrative  . Not on file    Outpatient Encounter Medications as of 01/14/2019  Medication Sig  . b complex vitamins tablet Take 1 tablet by mouth  daily.    . Calcium Carbonate-Vitamin D (CALTRATE 600+D) 600-400 MG-UNIT per tablet Take 1 tablet by mouth daily.    . clorazepate (TRANXENE) 15 MG tablet Take 0.5 tablets (7.5 mg total) by mouth daily.  . cromolyn (NASALCROM) 5.2 MG/ACT nasal spray 2 sprays by Nasal route 2 (two) times daily.    [name] doxazosin (CARDURA) 4 MG tablet TAKE 1 TABLET BY MOUTH EVERYDAY AT BEDTIME  . ELIQUIS 5 MG TABS tablet TAKE 1 TABLET BY MOUTH TWICE A DAY  . fenofibrate 160 MG tablet TAKE 1 TABLET BY MOUTH EVERY DAY  . fish oil-omega-3 fatty acids 1000 MG capsule Take 4 g by mouth daily.   [name] lisinopril (PRINIVIL,ZESTRIL) 20 MG tablet Take 1 tablet (20 mg total) by mouth daily.  . metoprolol (TOPROL-XL) 200 MG 24 hr tablet TAKE 1/2 A TABLET BY MOUTH EVERY DAY  . Multiple Vitamins-Minerals (MULTIVITAMIN PO) Take 1 tablet by mouth daily.  . Multiple Vitamins-Minerals (PRESERVISION AREDS 2) CAPS 2 po qd (Patient taking differently: Take 2 capsules by mouth daily. )  . simvastatin (ZOCOR) 20 MG tablet TAKE 1 TABLET BY MOUTH EVERYDAY AT BEDTIME  . vitamin C (ASCORBIC ACID) 500 MG tablet Take 500 mg by mouth daily.   No facility-administered encounter medications on file as of 01/14/2019.     Activities of Daily Living In your present state of health, do you have any difficulty performing the following activities: 01/14/2019  Hearing? Y  Comment wearing hearing aids. reports they work very well.   Vision? N  Comment wearing glasses.  Difficulty concentrating or making decisions? N  Comment reading [employer] religiously. does crosswords and wood working.  Walking or climbing stairs? N  Dressing or bathing? N  Doing errands, shopping? N  Preparing Food and eating ? N  Using the Toilet? N  In the past six months, have you accidently leaked urine? N  Do you have problems with loss of bowel control? N  Managing your Medications? N  Managing your Finances? N  Housekeeping or managing your Housekeeping? N  Some  recent data might be hidden    Patient Care Team: [name], [name], DO as PCP - General Troy Sine, MD as PCP - Cardiology (Cardiology) Gerarda Fraction, MD as Referring Physician (Ophthalmology) Alyson Ingles Candee Furbish, MD as Consulting Physician (Urology) Druscilla Brownie, MD as Consulting Physician (Dermatology)   Assessment:   This is a routine wellness examination for [place]. Physical assessment deferred to PCP.  Exercise Activities and Dietary recommendations Current Exercise Habits: Home exercise routine, Type of exercise: walking, Time (Minutes): 60, Frequency (Times/Week): 6, Weekly Exercise (Minutes/Week): 360, Intensity: Mild, Exercise limited by: None identified Diet (meal preparation, eat out, water intake, caffeinated beverages, dairy products, fruits and vegetables): in general, a "healthy" diet  , well balanced, on average, 2 meals per day. Reports a good breakfast, early dinner, and evening snack.        Goals    . Maintain healthy diet and continue to exercise 6-7 days per week.       Fall Risk Fall Risk  01/14/2019 12/22/2016 12/21/2015 12/08/2014 09/23/2013  Falls in the past year? 0 No No No No  Risk for fall due to : - History of fall(s);Impaired vision - - -   Depression Screen PHQ 2/9 Scores 01/14/2019 12/22/2016 12/21/2015 12/08/2014  PHQ - 2 Score 0 0 1 0    Cognitive Function MMSE - Mini Mental State Exam 01/14/2019 12/22/2016  Orientation to time 5 5  Orientation to Place 5 5  Registration 3 3  Attention/ Calculation 5 5  Recall 3 3  Language- name 2 objects 2 2  Language- repeat 1 1  Language- follow 3 step command 3 3  Language- read & follow direction 1 1  Write a sentence 1 1  Copy design 1 1  Total score 30 30        Immunization History  Administered Date(s) Administered  . Influenza Whole 11/14/2007, 09/30/2008, 09/08/2009, 08/31/2010, 09/03/2013  . Influenza, High Dose Seasonal PF 08/19/2015  . Influenza-Unspecified 09/07/2014,  09/15/2016  . Pneumococcal Conjugate-13 09/07/2014  . Pneumococcal Polysaccharide-23 09/22/2009  . Tdap 12/25/2012  . Zoster 03/19/2008    Screening Tests Health Maintenance  Topic Date Due  . INFLUENZA VACCINE  07/25/2018  . TETANUS/TDAP  12/25/2022  . PNA vac Low Risk Adult  Completed       Plan:    Please schedule your next medicare wellness visit with me in 1 yr.  Continue to eat heart healthy diet (full of fruits, vegetables, whole grains, lean protein, water--limit salt, fat, and sugar intake) and increase physical activity as tolerated.  Continue doing brain stimulating activities (puzzles, reading, adult coloring books, staying active) to keep memory sharp.      I have personally reviewed and noted the following in the patient's chart:   . Medical and social history . Use of alcohol, tobacco or illicit drugs  . Current medications and supplements . Functional ability and status . Nutritional status . Physical activity . Advanced directives . List of other physicians . Hospitalizations, surgeries, and ER visits in previous 12 months . Vitals . Screenings to include cognitive, depression, and falls . Referrals and appointments  In addition, I have reviewed and discussed with patient certain preventive protocols, quality metrics, and best practice recommendations. A written personalized care plan for preventive services as well as general preventive health recommendations were provided to patient.     Shela Nevin, [place]  01/14/2019

## 2019-01-14 ENCOUNTER — Ambulatory Visit (INDEPENDENT_AMBULATORY_CARE_PROVIDER_SITE_OTHER): Payer: Medicare Other | Admitting: *Deleted

## 2019-01-14 ENCOUNTER — Ambulatory Visit (INDEPENDENT_AMBULATORY_CARE_PROVIDER_SITE_OTHER): Payer: Medicare Other | Admitting: Family Medicine

## 2019-01-14 ENCOUNTER — Encounter: Payer: Self-pay | Admitting: *Deleted

## 2019-01-14 ENCOUNTER — Encounter: Payer: Self-pay | Admitting: Family Medicine

## 2019-01-14 VITALS — BP 140/76 | HR 60 | Temp 97.9°F | Resp 16 | Ht 70.0 in | Wt 159.0 lb

## 2019-01-14 VITALS — BP 140/76 | HR 60 | Ht 70.0 in | Wt 159.4 lb

## 2019-01-14 DIAGNOSIS — I10 Essential (primary) hypertension: Secondary | ICD-10-CM | POA: Diagnosis not present

## 2019-01-14 DIAGNOSIS — Z0000 Encounter for general adult medical examination without abnormal findings: Secondary | ICD-10-CM

## 2019-01-14 DIAGNOSIS — F411 Generalized anxiety disorder: Secondary | ICD-10-CM | POA: Diagnosis not present

## 2019-01-14 DIAGNOSIS — I480 Paroxysmal atrial fibrillation: Secondary | ICD-10-CM | POA: Diagnosis not present

## 2019-01-14 DIAGNOSIS — E785 Hyperlipidemia, unspecified: Secondary | ICD-10-CM

## 2019-01-14 MED ORDER — CLORAZEPATE DIPOTASSIUM 15 MG PO TABS: 7.5000 mg | ORAL_TABLET | Freq: Two times a day (BID) | ORAL | 2 refills | Status: DC

## 2019-01-14 MED ORDER — APIXABAN 5 MG PO TABS: 5.0000 mg | ORAL_TABLET | Freq: Two times a day (BID) | ORAL | 3 refills | Status: DC

## 2019-01-14 NOTE — Patient Instructions (Signed)

## 2019-01-14 NOTE — Progress Notes (Signed)
Patient ID: Mark Grumbine., male    DOB: 08-23-1928  Age: 85 y.o. MRN: [id number]    Subjective:  Subjective  HPI Mark Stark. presents for f/u.  No complaints.   Review of Systems  Constitutional: Negative for chills and fever.  HENT: Negative for congestion and hearing loss.   Eyes: Negative for discharge.  Respiratory: Negative for cough and shortness of breath.   Cardiovascular: Negative for chest pain, palpitations and leg swelling.  Gastrointestinal: Negative for abdominal pain, blood in stool, constipation, diarrhea, nausea and vomiting.  Genitourinary: Negative for dysuria, frequency, hematuria and urgency.  Musculoskeletal: Negative for back pain and myalgias.  Skin: Negative for rash.  Allergic/Immunologic: Negative for environmental allergies.  Neurological: Negative for dizziness, weakness and headaches.  Hematological: Does not bruise/bleed easily.  Psychiatric/Behavioral: Negative for suicidal ideas. The patient is not nervous/anxious.     History Past Medical History:  Diagnosis Date  . Anxiety   . Diverticulitis   . Dyslipidemia   . Hypertension   . 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 Prostate cancer [facility])     He has a past surgical history that includes Cataract extraction (01/27/2010); Cataract extraction (03/04/2010); Prostate seed implants; Prostate surgery; and Cardioversion (N/A, 03/01/2018).   His family history includes Alzheimer's disease in his mother; Cancer (age of onset: 25) in his brother; Dementia in his mother; Mental illness in his mother.He reports that he has never smoked. He has never used smokeless tobacco. He reports that he does not drink alcohol or use drugs.  Current Outpatient Medications on File Prior to Visit  Medication Sig Dispense Refill  . b complex vitamins tablet Take 1 tablet by mouth daily.      . Calcium Carbonate-Vitamin D (CALTRATE 600+D) 600-400 MG-UNIT per tablet Take 1 tablet by mouth daily.      . cromolyn  (NASALCROM) 5.2 MG/ACT nasal spray 2 sprays by Nasal route 2 (two) times daily.      [name] doxazosin (CARDURA) 4 MG tablet TAKE 1 TABLET BY MOUTH EVERYDAY AT BEDTIME 90 tablet 1  . fenofibrate 160 MG tablet TAKE 1 TABLET BY MOUTH EVERY DAY 90 tablet 1  . fish oil-omega-3 fatty acids 1000 MG capsule Take 4 g by mouth daily.     [name] lisinopril (PRINIVIL,ZESTRIL) 20 MG tablet Take 1 tablet (20 mg total) by mouth daily. 90 tablet 3  . metoprolol (TOPROL-XL) 200 MG 24 hr tablet TAKE 1/2 A TABLET BY MOUTH EVERY DAY 90 tablet 1  . Multiple Vitamins-Minerals (MULTIVITAMIN PO) Take 1 tablet by mouth daily.    . Multiple Vitamins-Minerals (PRESERVISION AREDS 2) CAPS 2 po qd (Patient taking differently: Take 2 capsules by mouth daily. )    . simvastatin (ZOCOR) 20 MG tablet TAKE 1 TABLET BY MOUTH EVERYDAY AT BEDTIME 90 tablet 1  . vitamin C (ASCORBIC ACID) 500 MG tablet Take 500 mg by mouth daily.     No current facility-administered medications on file prior to visit.      Objective:  Objective  Physical Exam Constitutional:      General: He is sleeping.     Appearance: He is well-developed.  HENT:     Head: Normocephalic and atraumatic.  Eyes:     Pupils: Pupils are equal, round, and reactive to light.  Neck:     Musculoskeletal: Normal range of motion and neck supple.     Thyroid: No thyromegaly.  Cardiovascular:     Rate and Rhythm: Normal rate  and regular rhythm.     Heart sounds: No murmur.  Pulmonary:     Effort: Pulmonary effort is normal. No respiratory distress.     Breath sounds: Normal breath sounds. No wheezing or rales.  Chest:     Chest wall: No tenderness.  Musculoskeletal:        General: No tenderness.  Skin:    General: Skin is warm and dry.  Neurological:     Mental Status: He is oriented to person, place, and time.  Psychiatric:        Behavior: Behavior normal.        Thought Content: Thought content normal.        Judgment: Judgment normal.    BP 140/76   Pulse  60   Temp 97.9 F (36.6 C) (Oral)   Resp 16   Ht 5\' 10"  (1.778 m)   Wt 159 lb (72.1 kg)   SpO2 98%   BMI 22.81 kg/m  Wt Readings from Last 3 Encounters:  01/14/19 159 lb (72.1 kg)  01/14/19 159 lb 6.4 oz (72.3 kg)  10/21/18 161 lb 6.4 oz (73.2 kg)     Lab Results  Component Value Date   WBC 8.6 02/27/2018   HGB 14.1 02/27/2018   HCT 40.9 02/27/2018   PLT 229 02/27/2018   GLUCOSE 89 07/18/2018   CHOL 101 07/12/2018   TRIG 50.0 07/12/2018   HDL 46.20 07/12/2018   LDLCALC 45 07/12/2018   ALT 21 07/12/2018   AST 27 07/12/2018   NA 135 07/18/2018   K 4.8 07/18/2018   CL 98 07/18/2018   CREATININE 1.08 07/18/2018   BUN 20 07/18/2018   CO2 25 07/18/2018   TSH 1.830 01/25/2018   PSA 0.35 12/27/2009   INR 1.2 02/27/2018   HGBA1C 5.0 05/21/2012    No results found.   Assessment & Plan:  Plan  I have changed Mark E. Giller Jr.'s ELIQUIS to apixaban. I have also changed his clorazepate. I am also having him maintain his b complex vitamins, Calcium Carbonate-Vitamin D, fish oil-omega-3 fatty acids, cromolyn, PRESERVISION AREDS 2, vitamin C, Multiple Vitamins-Minerals (MULTIVITAMIN PO), metoprolol, simvastatin, fenofibrate, doxazosin, and lisinopril.  Meds ordered this encounter  Medications  . clorazepate (TRANXENE) 15 MG tablet    Sig: Take 0.5 tablets (7.5 mg total) by mouth 2 (two) times daily.    Dispense:  30 tablet    Refill:  2    Not to exceed 4 additional fills before 10/02/2018  . apixaban (ELIQUIS) 5 MG TABS tablet    Sig: Take 1 tablet (5 mg total) by mouth 2 (two) times daily.    Dispense:  180 tablet    Refill:  3    Problem List Items Addressed This Visit      Unprioritized   Essential hypertension - Primary    Well controlled, no changes to meds. Encouraged heart healthy diet such as the DASH diet and exercise as tolerated.       Relevant Medications   apixaban (ELIQUIS) 5 MG TABS tablet   Other Relevant Orders   Lipid panel   Comprehensive  metabolic panel   Hyperlipidemia    Encouraged heart healthy diet, increase exercise, avoid trans fats, consider a krill oil cap daily      Relevant Medications   apixaban (ELIQUIS) 5 MG TABS tablet   Other Relevant Orders   Lipid panel   Comprehensive metabolic panel   Paroxysmal atrial fibrillation (HCC)   Relevant Medications  apixaban (ELIQUIS) 5 MG TABS tablet    Other Visit Diagnoses    Generalized anxiety disorder       Relevant Medications   clorazepate (TRANXENE) 15 MG tablet      Follow-up: Return in about 6 months (around 07/15/2019), or if symptoms worsen or fail to improve, for hypertension, hyperlipidemia.  [name], DO

## 2019-01-14 NOTE — Assessment & Plan Note (Signed)
Well controlled, no changes to meds. Encouraged heart healthy diet such as the DASH diet and exercise as tolerated.  °

## 2019-01-14 NOTE — Progress Notes (Signed)
Reviewed  Walton Digilio R Lowne Chase, DO  

## 2019-01-14 NOTE — Patient Instructions (Signed)
Please schedule your next medicare wellness visit with me in 1 yr.  Continue to eat heart healthy diet (full of fruits, vegetables, whole grains, lean protein, water--limit salt, fat, and sugar intake) and increase physical activity as tolerated.  Continue doing brain stimulating activities (puzzles, reading, adult coloring books, staying active) to keep memory sharp.    Mark Stark , Thank you for taking time to come for your Medicare Wellness Visit. I appreciate your ongoing commitment to your health goals. Please review the following plan we discussed and let me know if I can assist you in the future.   These are the goals we discussed: Goals    . Maintain healthy diet and continue to exercise 6-7 days per week.       This is a list of the screening recommended for you and due dates:  Health Maintenance  Topic Date Due  . Flu Shot  07/25/2018  . Tetanus Vaccine  12/25/2022  . Pneumonia vaccines  Completed    Health Maintenance After Age 63 After age 42, you are at a higher risk for certain long-term diseases and infections as well as injuries from falls. Falls are a major cause of broken bones and head injuries in people who are older than age 76. Getting regular preventive care can help to keep you healthy and well. Preventive care includes getting regular testing and making lifestyle changes as recommended by your health care provider. Talk with your health care provider about:  Which screenings and tests you should have. A screening is a test that checks for a disease when you have no symptoms.  A diet and exercise plan that is right for you. What should I know about screenings and tests to prevent falls? Screening and testing are the best ways to find a health problem early. Early diagnosis and treatment give you the best chance of managing medical conditions that are common after age 48. Certain conditions and lifestyle choices may make you more likely to have a fall. Your health  care provider may recommend:  Regular vision checks. Poor vision and conditions such as cataracts can make you more likely to have a fall. If you wear glasses, make sure to get your prescription updated if your vision changes.  Medicine review. Work with your health care provider to regularly review all of the medicines you are taking, including over-the-counter medicines. Ask your health care provider about any side effects that may make you more likely to have a fall. Tell your health care provider if any medicines that you take make you feel dizzy or sleepy.  Osteoporosis screening. Osteoporosis is a condition that causes the bones to get weaker. This can make the bones weak and cause them to break more easily.  Blood pressure screening. Blood pressure changes and medicines to control blood pressure can make you feel dizzy.  Strength and balance checks. Your health care provider may recommend certain tests to check your strength and balance while standing, walking, or changing positions.  Foot health exam. Foot pain and numbness, as well as not wearing proper footwear, can make you more likely to have a fall.  Depression screening. You may be more likely to have a fall if you have a fear of falling, feel emotionally low, or feel unable to do activities that you used to do.  Alcohol use screening. Using too much alcohol can affect your balance and may make you more likely to have a fall. What actions can I take to  lower my risk of falls? General instructions  Talk with your health care provider about your risks for falling. Tell your health care provider if: ? You fall. Be sure to tell your health care provider about all falls, even ones that seem minor. ? You feel dizzy, sleepy, or off-balance.  Take over-the-counter and prescription medicines only as told by your health care provider. These include any supplements.  Eat a healthy diet and maintain a healthy weight. A healthy diet  includes low-fat dairy products, low-fat (lean) meats, and fiber from whole grains, beans, and lots of fruits and vegetables. Home safety  Remove any tripping hazards, such as rugs, cords, and clutter.  Install safety equipment such as grab bars in bathrooms and safety rails on stairs.  Keep rooms and walkways well-lit. Activity   Follow a regular exercise program to stay fit. This will help you maintain your balance. Ask your health care provider what types of exercise are appropriate for you.  If you need a cane or walker, use it as recommended by your health care provider.  Wear supportive shoes that have nonskid soles. Lifestyle  Do not drink alcohol if your health care provider tells you not to drink.  If you drink alcohol, limit how much you have: ? 0-1 drink a day for women. ? 0-2 drinks a day for men.  Be aware of how much alcohol is in your drink. In the [place] one drink equals one typical bottle of beer (12 oz), one-half glass of wine (5 oz), or one shot of hard liquor (1 oz).  Do not use any products that contain nicotine or tobacco, such as cigarettes and e-cigarettes. If you need help quitting, ask your health care provider. Summary  Having a healthy lifestyle and getting preventive care can help to protect your health and wellness after age 17.  Screening and testing are the best way to find a health problem early and help you avoid having a fall. Early diagnosis and treatment give you the best chance for managing medical conditions that are more common for people who are older than age 45.  Falls are a major cause of broken bones and head injuries in people who are older than age 31. Take precautions to prevent a fall at home.  Work with your health care provider to learn what changes you can make to improve your health and wellness and to prevent falls. This information is not intended to replace advice given to you by your health care provider. Make sure you  discuss any questions you have with your health care provider. Document Released: 10/24/2017 Document Revised: 10/24/2017 Document Reviewed: 10/24/2017 Elsevier Interactive Patient Education  2019 [employer].

## 2019-01-14 NOTE — Assessment & Plan Note (Signed)
Encouraged heart healthy diet, increase exercise, avoid trans fats, consider a krill oil cap daily 

## 2019-01-15 LAB — LIPID PANEL
Cholesterol: 111 mg/dL (ref 0–200)
HDL: 48.5 mg/dL (ref >39.00)
LDL Cholesterol: 52 mg/dL (ref 0–99)
NonHDL: 62.82
Total CHOL/HDL Ratio: 2
Triglycerides: 54 mg/dL (ref 0.0–149.0)
VLDL: 10.8 mg/dL (ref 0.0–40.0)

## 2019-01-20 ENCOUNTER — Other Ambulatory Visit: Payer: Medicare Other

## 2019-01-20 DIAGNOSIS — I10 Essential (primary) hypertension: Secondary | ICD-10-CM | POA: Diagnosis not present

## 2019-01-20 NOTE — Addendum Note (Signed)
Addended by: Caffie Pinto on: 01/20/2019 08:44 AM   Modules accepted: Orders

## 2019-01-21 LAB — COMPLETE METABOLIC PANEL WITH GFR
AG Ratio: 2.3 (calc) (ref 1.0–2.5)
ALKALINE PHOSPHATASE (APISO): 26 U/L — AB (ref 40–115)
ALT: 17 U/L (ref 9–46)
AST: 30 U/L (ref 10–35)
Albumin: 4.5 g/dL (ref 3.6–5.1)
BUN/Creatinine Ratio: 16 (calc) (ref 6–22)
BUN: 19 mg/dL (ref 7–25)
CO2: 23 mmol/L (ref 20–32)
Calcium: 9.4 mg/dL (ref 8.6–10.3)
Chloride: 104 mmol/L (ref 98–110)
Creat: 1.19 mg/dL — ABNORMAL HIGH (ref 0.70–1.11)
GFR, Est African American: 63 mL/min/{1.73_m2} (ref >=60)
GFR, Est Non African American: 54 mL/min/{1.73_m2} — ABNORMAL LOW (ref >=60)
GLUCOSE: 81 mg/dL (ref 65–99)
Globulin: 2 g/dL (calc) (ref 1.9–3.7)
Potassium: 4.6 mmol/L (ref 3.5–5.3)
Sodium: 140 mmol/L (ref 135–146)
Total Bilirubin: 0.9 mg/dL (ref 0.2–1.2)
Total Protein: 6.5 g/dL (ref 6.1–8.1)

## 2019-01-22 ENCOUNTER — Encounter: Payer: Self-pay | Admitting: *Deleted

## 2019-01-24 ENCOUNTER — Other Ambulatory Visit: Payer: Self-pay | Admitting: Family Medicine

## 2019-01-24 DIAGNOSIS — E785 Hyperlipidemia, unspecified: Secondary | ICD-10-CM

## 2019-01-29 ENCOUNTER — Encounter: Payer: Self-pay | Admitting: *Deleted

## 2019-01-29 ENCOUNTER — Other Ambulatory Visit: Payer: Self-pay | Admitting: *Deleted

## 2019-01-29 DIAGNOSIS — R7989 Other specified abnormal findings of blood chemistry: Secondary | ICD-10-CM

## 2019-01-29 DIAGNOSIS — R799 Abnormal finding of blood chemistry, unspecified: Secondary | ICD-10-CM

## 2019-02-06 DIAGNOSIS — H43813 Vitreous degeneration, bilateral: Secondary | ICD-10-CM | POA: Diagnosis not present

## 2019-02-06 DIAGNOSIS — Z961 Presence of intraocular lens: Secondary | ICD-10-CM | POA: Diagnosis not present

## 2019-02-06 DIAGNOSIS — H35351 Cystoid macular degeneration, right eye: Secondary | ICD-10-CM | POA: Diagnosis not present

## 2019-02-06 DIAGNOSIS — H35373 Puckering of macula, bilateral: Secondary | ICD-10-CM | POA: Diagnosis not present

## 2019-02-06 DIAGNOSIS — H353122 Nonexudative age-related macular degeneration, left eye, intermediate dry stage: Secondary | ICD-10-CM | POA: Diagnosis not present

## 2019-02-06 DIAGNOSIS — H353211 Exudative age-related macular degeneration, right eye, with active choroidal neovascularization: Secondary | ICD-10-CM | POA: Diagnosis not present

## 2019-02-18 ENCOUNTER — Ambulatory Visit (INDEPENDENT_AMBULATORY_CARE_PROVIDER_SITE_OTHER): Payer: Medicare Other | Admitting: Cardiovascular Disease

## 2019-02-18 ENCOUNTER — Encounter: Payer: Self-pay | Admitting: Cardiovascular Disease

## 2019-02-18 ENCOUNTER — Encounter (INDEPENDENT_AMBULATORY_CARE_PROVIDER_SITE_OTHER): Payer: Self-pay

## 2019-02-18 VITALS — BP 142/64 | HR 56 | Ht 70.0 in | Wt 160.6 lb

## 2019-02-18 DIAGNOSIS — E785 Hyperlipidemia, unspecified: Secondary | ICD-10-CM

## 2019-02-18 DIAGNOSIS — I480 Paroxysmal atrial fibrillation: Secondary | ICD-10-CM | POA: Diagnosis not present

## 2019-02-18 DIAGNOSIS — I10 Essential (primary) hypertension: Secondary | ICD-10-CM

## 2019-02-18 DIAGNOSIS — I440 Atrioventricular block, first degree: Secondary | ICD-10-CM | POA: Diagnosis not present

## 2019-02-18 DIAGNOSIS — Z7901 Long term (current) use of anticoagulants: Secondary | ICD-10-CM | POA: Diagnosis not present

## 2019-02-18 NOTE — Progress Notes (Signed)
Cardiology Office Note    Date:  02/18/2019   ID:  Mark Stark., DOB 1928/07/05, MRN [id number]  PCP:  [name], [name], DO  Cardiologist:  Shelva Majestic, MD   F/U cardiology evaluation, referred through the courtesy of Dr. Roma Schanz for evaluation of an irregular rhythm in atrial fibrillation.  History of Present Illness:  Mark Stark. is a 85 y.o. male who was referred by Dr. [name] for atrial fibrillation.  I saw him as an add-on on 01/24/2018.  He has been seen on several occasions since and was last seen on October 21, 2018.  He presents for a follow-up evaluation.  Mark Stark is a young appearing 85 year old male who remotely had seen Dr. Percival Spanish on one occasion in 2012.  He was noted have a mild slight heart murmur.  Over the last several years he has continued to be active.  He typically walks for 1 hour every morning.  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  On Christmas Eve 2018, he developed a sensation of an irregular rhythm.  His heart rhythm has persisted.  He had seen Dr. Roma Schanz and was found to be in atrial fibrillation.  He was started on eliquis 5 mg twice a day for anticoagulation.  He has been taking metoprolol succinate 100 mg daily, lisinopril 5 mg, and doxazocin 4 mg for hypertension.  He is on simvastatin 20 mg daily for hyperlipidemia.  When I saw him, he had just started to wear a Holter monitor.   His initial strips confirmed persistent atrial fibrillation.  A [facility] service read out from 01/24/2018 at 450:54 AM has revealed a 3.1 second pause while the patient was sleeping. [name]  He denied any chest pain, pesyncope, PND, orthopnea.  He had experience mild fatigue and slight shortness of breath.  He denied issues with sleep.    He was started on eliquis for anticoagulation  on January 06, 2018.  He underwent a 2-D echo Doppler study on 01/21/2018 which showed an EF of 55-60%.  Ascending aorta was upper  normal at 40 mm.  He had moderate mitral regurgitation.  His left atrium measured 55 mm was interpreted as severe dilation.  LA volume was 56 mL's per meter squared. When I last saw himon 02/22/2018, he was maintaining atrial fibrillation and had been on anticoagulatio for 6 weeks.  He underwent successful DC cardioversion on 03/01/2018 and was successfully converted to sinus rhythm with 120 J synchronized countershock.  I saw him in follow-up on March 15, 2018, he was maintaining sinus rhythm and was without recurrent awareness of arrhythmia.  His ECG showed sinus bradycardia at 54 bpm with first-degree AV block.    When I last saw him in June 2019 he was remaining stable and was unaware of any recurrent atrial fibrillation.  He continues to be active working in a large garden and also pushing a Therapist, art.  Over the past several months, he has been on a regimen consisting of lisinopril 15 mg, metoprolol 100 mg daily, doxazosin 4 mg at bedtime.  He has been on fenofibrate 160 mg and simvastatin 20 mg for hyperlipidemia.  He has continued to be on Eliquis 5 mg twice a day.  He had sent me a letter last month which stated that his blood pressures at home were ranging between 116 and 148 with a median of 133.  His pulse was 59 and diastolic pressures  were in the 60s to 53s.  He continues to be doing well and denies recurrent palpitations or awareness of atrial fibrillation.    I last saw him in October 2019.  During that evaluation I further titrated his lisinopril to 20 mg daily.  Over the past several months he has now been maintained on lisinopril 20 mg daily, Toprol-XL 100 mg, and Cardura 4 mg at bedtime.  He is very compulsive with his blood pressure readings.  Over this 65-month  He has taken 79 blood pressure readings over 51 days mean blood pressure is 139/70 with a heart rate of 58 with his range systolic 1 251-058 and diastolic 625-85with heart rates between 50 and 70.  He was concerned that his blood  pressure is still elevated.  He feels well.  He denies chest pain.  He denies shortness of breath.  He presents for reevaluation  Past Medical History:  Diagnosis Date  . Anxiety   . Diverticulitis   . Dyslipidemia   . Hypertension   . Inferior mesenteric vein thrombosis ([place]   . Prostate cancer ([facility]     Past Surgical History:  Procedure Laterality Date  . CARDIOVERSION N/A 03/01/2018   Procedure: CARDIOVERSION;  Surgeon: KTroy Sine MD;  [facility];  Service: Cardiovascular;  Laterality: N/A;  . CATARACT EXTRACTION  01/27/2010   right  . CATARACT EXTRACTION  03/04/2010   left  . Prostate seed implants    . PROSTATE SURGERY      Current Medications: Outpatient Medications Prior to Visit  Medication Sig Dispense Refill  . apixaban (ELIQUIS) 5 MG TABS tablet Take 1 tablet (5 mg total) by mouth 2 (two) times daily. 180 tablet 3  . b complex vitamins tablet Take 1 tablet by mouth daily.      . Calcium Carbonate-Vitamin D (CALTRATE 600+D) 600-400 MG-UNIT per tablet Take 1 tablet by mouth daily.      . clorazepate (TRANXENE) 15 MG tablet Take 0.5 tablets (7.5 mg total) by mouth 2 (two) times daily. 30 tablet 2  . cromolyn (NASALCROM) 5.2 MG/ACT nasal spray 2 sprays by Nasal route 2 (two) times daily.      .Marland Kitchendoxazosin (CARDURA) 4 MG tablet TAKE 1 TABLET BY MOUTH EVERYDAY AT BEDTIME 90 tablet 1  . fenofibrate 160 MG tablet TAKE 1 TABLET BY MOUTH EVERY DAY 90 tablet 1  . fish oil-omega-3 fatty acids 1000 MG capsule Take 4 g by mouth daily.     .Marland Kitchenlisinopril (PRINIVIL,ZESTRIL) 20 MG tablet Take 1 tablet (20 mg total) by mouth daily. 90 tablet 3  . metoprolol (TOPROL-XL) 200 MG 24 hr tablet TAKE 1/2 A TABLET BY MOUTH EVERY DAY 90 tablet 1  . Multiple Vitamins-Minerals (MULTIVITAMIN PO) Take 1 tablet by mouth daily.    . Multiple Vitamins-Minerals (PRESERVISION AREDS 2) CAPS 2 po qd (Patient taking differently: Take 2 capsules by mouth daily. )    . simvastatin (ZOCOR) 20  MG tablet TAKE 1 TABLET BY MOUTH EVERYDAY AT BEDTIME 90 tablet 1  . vitamin C (ASCORBIC ACID) 500 MG tablet Take 500 mg by mouth daily.     No facility-administered medications prior to visit.      Allergies:   Sulfonamide derivatives   Social History   Socioeconomic History  . Marital status: Married    Spouse name: Not on file  . Number of children: Not on file  . Years of education: Not on file  . Highest education level:  Not on file  Occupational History  . Occupation: Professor---RETIRED    Employer: RETIRED    Comment: Retired  Scientific laboratory technician  . Financial resource strain: Not on file  . Food insecurity:    Worry: Not on file    Inability: Not on file  . Transportation needs:    Medical: Not on file    Non-medical: Not on file  Tobacco Use  . Smoking status: Never Smoker  . Smokeless tobacco: Never Used  Substance and Sexual Activity  . Alcohol use: No    Comment: Little wine occasionally   . Drug use: No  . Sexual activity: Never    Partners: Female  Lifestyle  . Physical activity:    Days per week: Not on file    Minutes per session: Not on file  . Stress: Not on file  Relationships  . Social connections:    Talks on phone: Not on file    Gets together: Not on file    Attends religious service: Not on file    Active member of club or organization: Not on file    Attends meetings of clubs or organizations: Not on file    Relationship status: Not on file  Other Topics Concern  . Not on file  Social History Narrative  . Not on file     Family History:  The patient's family history includes Alzheimer's disease in his mother; Cancer (age of onset: 20) in his brother; Dementia in his mother; Mental illness in his mother.  His father is deceased and had suffered a myocardial infarction and had 3 CVAs.  His mother died and had Alzheimer's disease.  ROS General: Negative; No fevers, chills, or night sweats;  HEENT: Negative; No changes in vision or hearing,  sinus congestion, difficulty swallowing Pulmonary: Negative; No cough, wheezing, shortness of breath, hemoptysis Cardiovascular: See history of present illness GI: Negative; No nausea, vomiting, diarrhea, or abdominal pain GU: Negative; No dysuria, hematuria, or difficulty voiding Musculoskeletal: Negative; no myalgias, joint pain, or weakness Hematologic/Oncology: Negative; no easy bruising, bleeding Endocrine: Negative; no heat/cold intolerance; no diabetes Neuro: Negative; no changes in balance, headaches Skin: Negative; No rashes or skin lesions Psychiatric: Negative; No behavioral problems, depression Sleep: Negative; No snoring, daytime sleepiness, hypersomnolence, bruxism, restless legs, hypnogognic hallucinations, no cataplexy Other comprehensive 14 point system review is negative.   PHYSICAL EXAM:   VS:  BP (!) 142/64   Pulse (!) 56   Ht 5' 10"  (1.778 m)   Wt 160 lb 9.6 oz (72.8 kg)   BMI 23.04 kg/m     Repeat blood pressure by me was 119/68 supine and 124/70 standing.  Wt Readings from Last 3 Encounters:  02/18/19 160 lb 9.6 oz (72.8 kg)  01/14/19 159 lb (72.1 kg)  01/14/19 159 lb 6.4 oz (72.3 kg)    General: Alert, oriented, no distress.  Skin: normal turgor, no rashes, warm and dry HEENT: Normocephalic, atraumatic. Pupils equal round and reactive to light; sclera anicteric; extraocular muscles intact;  Nose without nasal septal hypertrophy Mouth/Parynx benign; Mallinpatti scale 3 Neck: No JVD, no carotid bruits; normal carotid upstroke Lungs: clear to ausculatation and percussion; no wheezing or rales Chest wall: without tenderness to palpitation Heart: PMI not displaced, RRR, s1 s2 normal, 1/6 systolic murmur, no diastolic murmur, no rubs, gallops, thrills, or heaves Abdomen: soft, nontender; no hepatosplenomehaly, BS+; abdominal aorta nontender and not dilated by palpation. Back: no CVA tenderness Pulses 2+ Musculoskeletal: full range of motion, normal  strength,  no joint deformities Extremities: no clubbing cyanosis or edema, Homan's sign negative  Neurologic: grossly nonfocal; Cranial nerves grossly wnl Psychologic: Normal mood and affect    Studies/Labs Reviewed:   ECG (independently read by me):Sinus bradycardia at 56 bpm.  First-degree AV block.  Early transition..  Level 238 ms.  October 21, 2018 ECG (independently read by me): Sinus bradycardia 56 bpm; first-degree AV block with a PR interval of 236 ms.  No ectopy.  No ST segment changes.  March 15, 2018 ECG (independently read by me): sinus bradycardia 54 bpm.  First degree AV block with PR interval at 246 bili seconds.  No ectopy.  Normal QTc interval.  02/22/2018 EKG:  EKG is ordered today.  Atrial fibrillation at 81 bpm.  QTc interval 397 ms.  01/24/2018 ECG (independently read by me): atrial fibrillation at 73 bpm. QTc interval 387 ms.  No significant ST-T changes.  Recent Labs: BMP Latest Ref Rng & Units 01/20/2019 07/18/2018 07/12/2018  Glucose 65 - 99 mg/dL 81 89 97  BUN 7 - 25 mg/dL 19 20 21   Creatinine 0.70 - 1.11 mg/dL 1.19(H) 1.08 1.15  BUN/Creat Ratio 6 - 22 (calc) 16 19 -  Sodium 135 - 146 mmol/L 140 135 134(L)  Potassium 3.5 - 5.3 mmol/L 4.6 4.8 4.2  Chloride 98 - 110 mmol/L 104 98 99  CO2 20 - 32 mmol/L 23 25 31   Calcium 8.6 - 10.3 mg/dL 9.4 9.2 9.4     Hepatic Function Latest Ref Rng & Units 01/20/2019 07/12/2018 01/11/2018  Total Protein 6.1 - 8.1 g/dL 6.5 6.6 6.4  Albumin 3.5 - 5.2 g/dL - 4.3 -  AST 10 - 35 U/L 30 27 22   ALT 9 - 46 U/L 17 21 18   Alk Phosphatase 39 - 117 U/L - 29(L) -  Total Bilirubin 0.2 - 1.2 mg/dL 0.9 1.0 1.1  Bilirubin, Direct 0.0 - 0.3 mg/dL - - -    CBC Latest Ref Rng & Units 02/27/2018 01/25/2018 12/22/2016  WBC 3.4 - 10.8 x10E3/uL 8.6 4.4 7.7  Hemoglobin 13.0 - 17.7 g/dL 14.1 15.8 14.1  Hematocrit 37.5 - 51.0 % 40.9 45.7 41.2  Platelets 150 - 379 x10E3/uL 229 166 208.0   Lab Results  Component Value Date   MCV 94 02/27/2018     MCV 95 01/25/2018   MCV 95.1 12/22/2016   Lab Results  Component Value Date   TSH 1.830 01/25/2018   Lab Results  Component Value Date   HGBA1C 5.0 05/21/2012     BNP No results found for: BNP  ProBNP No results found for: PROBNP   Lipid Panel     Component Value Date/Time   CHOL 111 01/14/2019 1436   TRIG 54.0 01/14/2019 1436   HDL 48.50 01/14/2019 1436   CHOLHDL 2 01/14/2019 1436   VLDL 10.8 01/14/2019 1436   LDLCALC 52 01/14/2019 1436   LDLCALC 32 01/11/2018 1454     RADIOLOGY: No results found.   Additional studies/ records that were reviewed today include:  I reviewed the prior evaluation in 2012.  I reviewed the primary care evaluation from [facility]. I reviewed the Holter monitor. ------------------------------------------------------------------- 01/21/2018  Echo Study Conclusions  - Left ventricle: The cavity size was normal. There was moderate   concentric hypertrophy. Systolic function was normal. The   estimated ejection fraction was in the range of 55% to 60%. Wall   motion was normal; there were no regional wall motion   abnormalities. The study was not technically  sufficient to allow   evaluation of LV diastolic dysfunction due to atrial   fibrillation. - Aortic valve: Valve mobility was restricted. There was moderate   regurgitation. - Ascending aorta: The ascending aorta was upper normal size   measuring 40 mm. - Mitral valve: There was moderate regurgitation. - Left atrium: The atrium was severely dilated. - Right ventricle: The cavity size was normal. Wall thickness was   normal. Systolic function was normal. - Right atrium: The atrium was mildly dilated. - Tricuspid valve: There was moderate regurgitation. - Pulmonic valve: There was trivial regurgitation. - Pulmonary arteries: Systolic pressure was mildly increased. PA   peak pressure: 38 mm Hg (S). - Inferior vena cava: The vessel was normal in size. - Pericardium,  extracardiac: There was no pericardial effusion.   ASSESSMENT:    1. Essential hypertension   2. Paroxysmal atrial fibrillation (HCC)   3. Hyperlipidemia, unspecified hyperlipidemia type   4. Anticoagulation adequate   5. First degree AV block     PLAN:  MarkStark is a young appearing, active 85 year old gentleman who has a history of hypertension, hyperlipidemia, and developed new onset irregular heart rhythm on Christmas Eve 2018.  He was started on eliquis anticoagulation after seeing Dr. [name] and has been on therapy since 01/06/2018.  His echo Doppler study demonstrated  preserved LV function.   However, he had a dilated left atrium.  In 2012, his left atrial dimension was increased at 49 mm but this had further increased on his most recent echo in January 2019 at 55 mm. He underwent successful outpatient DC cardioversion and has been maintaining sinus rhythm since his procedure on 03/01/2018.  His ECG today confirms that he is maintaining sinus rhythm.  I had a long discussion with him if there is off concerning his blood pressure readings.  He has been taking Toprol 100 mg in the morning, lisinopril in the latter part of the afternoon and doxazosin at night.  On my exam today his blood pressure is excellent at 118/68 without orthostatic drop.  I have suggested that he take lisinopril and Toprol in the morning but continue to take the Cardura at bedtime.  At this point I do not feel there is a need for further increase in blood pressure medications.  I reviewed his most recent laboratory.  Creatinine was 1.19 which was minimally increased with his estimated GFR at 54 cc.  He has first-degree AV block.  He is asymptomatic.  Lipid studies are excellent with a total cholesterol 111, HDL 48, LDL 52 and triglycerides 54.  He continues to be on simvastatin and is tolerating this well.  I will see him in 6 months for reevaluation or sooner if problems arise.    Medication Adjustments/Labs and  Tests Ordered: Current medicines are reviewed at length with the patient today.  Concerns regarding medicines are outlined above.  Medication changes, Labs and Tests ordered today are listed in the Patient Instructions below. Patient Instructions  Medication Instructions:  The current medical regimen is effective;  continue present plan and medications.  If you need a refill on your cardiac medications before your next appointment, please call your pharmacy.   Follow-Up: At [facility], you and your health needs are our priority.  As part of our continuing mission to provide you with exceptional heart care, we have created designated Provider Care Teams.  These Care Teams include your primary Cardiologist (physician) and Advanced Practice Providers (APPs -  Physician Assistants and Nurse Practitioners)  who all work together to provide you with the care you need, when you need it. You will need a follow up appointment in 6 months.  Please call our office 2 months in advance to schedule this appointment.  You may see Shelva Majestic, MD or one of the following Advanced Practice Providers on your designated Care Team: [place], [place] . Fabian Sharp, PA-C       Signed, Shelva Majestic, MD  02/18/2019 1:53 PM    [facility] [address] [place], [place], [place]  [address] Phone: [phone]

## 2019-02-18 NOTE — Patient Instructions (Signed)

## 2019-02-20 ENCOUNTER — Other Ambulatory Visit: Payer: Self-pay | Admitting: Family Medicine

## 2019-02-20 DIAGNOSIS — I10 Essential (primary) hypertension: Secondary | ICD-10-CM

## 2019-03-02 ENCOUNTER — Other Ambulatory Visit: Payer: Self-pay | Admitting: Family Medicine

## 2019-04-18 ENCOUNTER — Other Ambulatory Visit: Payer: Self-pay | Admitting: Family Medicine

## 2019-04-18 DIAGNOSIS — F411 Generalized anxiety disorder: Secondary | ICD-10-CM

## 2019-04-21 ENCOUNTER — Other Ambulatory Visit: Payer: Self-pay | Admitting: Family Medicine

## 2019-04-21 ENCOUNTER — Telehealth: Payer: Self-pay | Admitting: Family Medicine

## 2019-04-21 NOTE — Telephone Encounter (Signed)
Duplicate request

## 2019-04-21 NOTE — Telephone Encounter (Signed)
Requested medication (s) are due for refill today: Yes  Requested medication (s) are on the active medication list: Yes  Last refill:  01/14/19  Future visit scheduled: Yes  Notes to clinic:  Unable to delegate, cannot refill per protocol     Requested Prescriptions  Pending Prescriptions Disp Refills   clorazepate (TRANXENE) 15 MG tablet [Pharmacy Med Name: CLORAZEPATE 15 MG TABLET] 30 tablet 2    Sig: TAKE 1/2 TABLET BY MOUTH 2 TIMES DAILY     Not Delegated - Psychiatry:  Anxiolytics/Hypnotics Failed - 04/21/2019 10:17 AM      Failed - This refill cannot be delegated      Passed - Urine Drug Screen completed in last 360 days.      Passed - Valid encounter within last 6 months    Recent Outpatient Visits          3 months ago Essential hypertension   Archivist at [place], [place]   9 months ago Hyperlipidemia LDL goal <100   Archivist at [place], DO   1 year ago Irregular heart beat   Archivist at [employer], [place], DO   1 year ago Essential hypertension   Archivist at [place], DO   2 years ago Generalized anxiety disorder   Archivist at [place], DO      Future Appointments            In 2 months [place], [place] at [employer], [place]   In 9 months [place], [place], Research scientist (physical sciences) at [employer], [place]

## 2019-04-21 NOTE — Telephone Encounter (Signed)
Requesting: Tranxene Contract: 07/12/2018 UDS: Yes, low risk Last OV: 01/14/2018 Next OV: 07/17/2019 Last Refill: 01/14/2019, #30--2 RF Database:   Please advise

## 2019-04-21 NOTE — Telephone Encounter (Signed)
Patient called to check status.

## 2019-04-21 NOTE — Telephone Encounter (Signed)
Copied from [place] [id number]. Topic: Quick Communication - Rx Refill/Question >> Apr 21, 2019 10:19 AM Rainey Pines A wrote: Medication:clorazepate (TRANXENE) 15 MG tablet  Has the patient contacted their pharmacy? Yes (Agent: If no, request that the patient contact the pharmacy for the refill.) (Agent: If yes, when and what did the pharmacy advise?) Contact PCP  Preferred Pharmacy (with phone number or street name): [id number] [place], [place] [phone] (Phone) [phone] (Fax)    Agent: Please be advised that RX refills may take up to 3 business days. We ask that you follow-up with your pharmacy.

## 2019-04-29 ENCOUNTER — Other Ambulatory Visit: Payer: Medicare Other

## 2019-07-14 ENCOUNTER — Other Ambulatory Visit: Payer: Self-pay

## 2019-07-17 ENCOUNTER — Other Ambulatory Visit: Payer: Self-pay

## 2019-07-17 ENCOUNTER — Ambulatory Visit (INDEPENDENT_AMBULATORY_CARE_PROVIDER_SITE_OTHER): Payer: Medicare Other | Admitting: Family Medicine

## 2019-07-17 ENCOUNTER — Encounter: Payer: Self-pay | Admitting: Family Medicine

## 2019-07-17 VITALS — BP 152/60 | HR 64 | Temp 98.0°F | Resp 18 | Ht 70.0 in | Wt 152.6 lb

## 2019-07-17 DIAGNOSIS — W57XXXA Bitten or stung by nonvenomous insect and other nonvenomous arthropods, initial encounter: Secondary | ICD-10-CM | POA: Diagnosis not present

## 2019-07-17 DIAGNOSIS — F411 Generalized anxiety disorder: Secondary | ICD-10-CM | POA: Diagnosis not present

## 2019-07-17 DIAGNOSIS — I480 Paroxysmal atrial fibrillation: Secondary | ICD-10-CM | POA: Diagnosis not present

## 2019-07-17 DIAGNOSIS — Z8546 Personal history of malignant neoplasm of prostate: Secondary | ICD-10-CM | POA: Diagnosis not present

## 2019-07-17 DIAGNOSIS — S30860A Insect bite (nonvenomous) of lower back and pelvis, initial encounter: Secondary | ICD-10-CM | POA: Diagnosis not present

## 2019-07-17 DIAGNOSIS — S80862A Insect bite (nonvenomous), left lower leg, initial encounter: Secondary | ICD-10-CM | POA: Diagnosis not present

## 2019-07-17 DIAGNOSIS — I10 Essential (primary) hypertension: Secondary | ICD-10-CM | POA: Diagnosis not present

## 2019-07-17 DIAGNOSIS — E785 Hyperlipidemia, unspecified: Secondary | ICD-10-CM | POA: Diagnosis not present

## 2019-07-17 DIAGNOSIS — M791 Myalgia, unspecified site: Secondary | ICD-10-CM | POA: Diagnosis not present

## 2019-07-17 LAB — LIPID PANEL
Cholesterol: 99 mg/dL (ref 0–200)
HDL: 49.8 mg/dL (ref >39.00)
LDL Cholesterol: 42 mg/dL (ref 0–99)
NonHDL: 48.98
Total CHOL/HDL Ratio: 2
Triglycerides: 37 mg/dL (ref 0.0–149.0)
VLDL: 7.4 mg/dL (ref 0.0–40.0)

## 2019-07-17 LAB — COMPREHENSIVE METABOLIC PANEL
ALT: 23 U/L (ref 0–53)
AST: 39 U/L — ABNORMAL HIGH (ref 0–37)
Albumin: 4.1 g/dL (ref 3.5–5.2)
Alkaline Phosphatase: 34 U/L — ABNORMAL LOW (ref 39–117)
BUN: 25 mg/dL — ABNORMAL HIGH (ref 6–23)
CO2: 28 mEq/L (ref 19–32)
Calcium: 9.2 mg/dL (ref 8.4–10.5)
Chloride: 104 mEq/L (ref 96–112)
Creatinine, Ser: 1.05 mg/dL (ref 0.40–1.50)
GFR: 66.5 mL/min (ref >60.00)
Glucose, Bld: 98 mg/dL (ref 70–99)
Potassium: 4.2 mEq/L (ref 3.5–5.1)
Sodium: 139 mEq/L (ref 135–145)
Total Bilirubin: 0.8 mg/dL (ref 0.2–1.2)
Total Protein: 6 g/dL (ref 6.0–8.3)

## 2019-07-17 MED ORDER — CLORAZEPATE DIPOTASSIUM 15 MG PO TABS: ORAL_TABLET | ORAL | 2 refills | Status: DC

## 2019-07-17 NOTE — Progress Notes (Signed)
Patient ID: Mark Mendonsa., male    DOB: May 14, 1928  Age: 85 y.o. MRN: [id number]    Subjective:  Subjective  HPI Mark Stark. presents for f/u bp and cholesterol    Pt sees dr Claiborne Billings regularly--- bp checks regularly----at home 131/62 Pt has c/o tick bites x 3 early in the season and dug them out---he has no other symptoms other then a few aches and pains Bites have completely healed No other complaints.   Review of Systems  Constitutional: Negative for chills and fever.  HENT: Negative for congestion and hearing loss.   Eyes: Negative for discharge.  Respiratory: Negative for cough and shortness of breath.   Cardiovascular: Negative for chest pain, palpitations and leg swelling.  Gastrointestinal: Negative for abdominal pain, blood in stool, constipation, diarrhea, nausea and vomiting.  Genitourinary: Negative for dysuria, frequency, hematuria and urgency.  Musculoskeletal: Negative for back pain and myalgias.  Skin: Negative for rash.  Allergic/Immunologic: Negative for environmental allergies.  Neurological: Negative for dizziness, weakness and headaches.  Hematological: Does not bruise/bleed easily.  Psychiatric/Behavioral: Negative for suicidal ideas. The patient is not nervous/anxious.     History Past Medical History:  Diagnosis Date  . Anxiety   . Diverticulitis   . Dyslipidemia   . Hypertension   . 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 Prostate cancer [facility])     He has a past surgical history that includes Cataract extraction (01/27/2010); Cataract extraction (03/04/2010); Prostate seed implants; Prostate surgery; and Cardioversion (N/A, 03/01/2018).   His family history includes Alzheimer's disease in his mother; Cancer (age of onset: 5) in his brother; Dementia in his mother; Mental illness in his mother.He reports that he has never smoked. He has never used smokeless tobacco. He reports that he does not drink alcohol or use drugs.  Current Outpatient  Medications on File Prior to Visit  Medication Sig Dispense Refill  . apixaban (ELIQUIS) 5 MG TABS tablet Take 1 tablet (5 mg total) by mouth 2 (two) times daily. 180 tablet 3  . b complex vitamins tablet Take 1 tablet by mouth daily.      . Calcium Carbonate-Vitamin D (CALTRATE 600+D) 600-400 MG-UNIT per tablet Take 1 tablet by mouth daily.      . cromolyn (NASALCROM) 5.2 MG/ACT nasal spray 2 sprays by Nasal route 2 (two) times daily.      [name] doxazosin (CARDURA) 4 MG tablet TAKE 1 TABLET BY MOUTH EVERYDAY AT BEDTIME 90 tablet 1  . fenofibrate 160 MG tablet TAKE 1 TABLET BY MOUTH EVERY DAY 90 tablet 1  . fish oil-omega-3 fatty acids 1000 MG capsule Take 4 g by mouth daily.     [name] lisinopril (PRINIVIL,ZESTRIL) 20 MG tablet Take 1 tablet (20 mg total) by mouth daily. 90 tablet 3  . metoprolol (TOPROL-XL) 200 MG 24 hr tablet TAKE 1/2 TABLET BY MOUTH EVERY DAY 45 tablet 3  . Multiple Vitamins-Minerals (MULTIVITAMIN PO) Take 1 tablet by mouth daily.    . Multiple Vitamins-Minerals (PRESERVISION AREDS 2) CAPS 2 po qd (Patient taking differently: Take 2 capsules by mouth daily. )    . simvastatin (ZOCOR) 20 MG tablet TAKE 1 TABLET BY MOUTH EVERYDAY AT BEDTIME 90 tablet 1  . vitamin C (ASCORBIC ACID) 500 MG tablet Take 500 mg by mouth daily.     No current facility-administered medications on file prior to visit.      Objective:  Objective  Physical Exam Vitals signs and nursing note reviewed.  Constitutional:      General: He is sleeping.     Appearance: He is well-developed.  HENT:     Head: Normocephalic and atraumatic.  Eyes:     Pupils: Pupils are equal, round, and reactive to light.  Neck:     Musculoskeletal: Normal range of motion and neck supple.     Thyroid: No thyromegaly.  Cardiovascular:     Rate and Rhythm: Normal rate and regular rhythm.     Heart sounds: No murmur.  Pulmonary:     Effort: Pulmonary effort is normal. No respiratory distress.     Breath sounds: Normal breath  sounds. No wheezing or rales.  Chest:     Chest wall: No tenderness.  Musculoskeletal:        General: No tenderness.  Skin:    General: Skin is warm and dry.  Neurological:     Mental Status: He is oriented to person, place, and time.  Psychiatric:        Behavior: Behavior normal.        Thought Content: Thought content normal.        Judgment: Judgment normal.    BP (!) 152/60 (BP Location: Left Arm, Patient Position: Sitting, Cuff Size: Normal)   Pulse 64   Temp 98 F (36.7 C) (Oral)   Resp 18   Ht 5\' 10"  (1.778 m)   Wt 152 lb 9.6 oz (69.2 kg)   SpO2 99%   BMI 21.90 kg/m  Wt Readings from Last 3 Encounters:  07/17/19 152 lb 9.6 oz (69.2 kg)  02/18/19 160 lb 9.6 oz (72.8 kg)  01/14/19 159 lb (72.1 kg)     Lab Results  Component Value Date   WBC 8.6 02/27/2018   HGB 14.1 02/27/2018   HCT 40.9 02/27/2018   PLT 229 02/27/2018   GLUCOSE 81 01/20/2019   CHOL 111 01/14/2019   TRIG 54.0 01/14/2019   HDL 48.50 01/14/2019   LDLCALC 52 01/14/2019   ALT 17 01/20/2019   AST 30 01/20/2019   NA 140 01/20/2019   K 4.6 01/20/2019   CL 104 01/20/2019   CREATININE 1.19 (H) 01/20/2019   BUN 19 01/20/2019   CO2 23 01/20/2019   TSH 1.830 01/25/2018   PSA 0.35 12/27/2009   INR 1.2 02/27/2018   HGBA1C 5.0 05/21/2012    No results found.   Assessment & Plan:  Plan  I am having Mark Stark. maintain his b complex vitamins, Calcium Carbonate-Vitamin D, fish oil-omega-3 fatty acids, cromolyn, PreserVision AREDS 2, vitamin C, Multiple Vitamins-Minerals (MULTIVITAMIN PO), lisinopril, apixaban, metoprolol, fenofibrate, simvastatin, doxazosin, and clorazepate.  Meds ordered this encounter  Medications  . clorazepate (TRANXENE) 15 MG tablet    Sig: TAKE 1/2 TABLET BY MOUTH 2 TIMES DAILY    Dispense:  30 tablet    Refill:  2    Not to exceed 3 additional fills before 07/13/2019    Problem List Items Addressed This Visit      Unprioritized   Essential hypertension     Well controlled, no changes to meds. Encouraged heart healthy diet such as the DASH diet and exercise as tolerated.       Hyperlipidemia    Tolerating statin, encouraged heart healthy diet, avoid trans fats, minimize simple carbs and saturated fats. Increase exercise as tolerated      Relevant Orders   Lipid panel   Comprehensive metabolic panel   Paroxysmal atrial fibrillation [facility])    Per cardiology  PROSTATE CANCER, HX OF    Pt f/u with urology       Other Visit Diagnoses    Tick bite of back, initial encounter    -  Primary   Relevant Orders   B. burgdorfi antibodies   Rocky mtn spotted fvr ab, IgM-blood   Generalized anxiety disorder       Relevant Medications   clorazepate (TRANXENE) 15 MG tablet   Tick bite of left lower leg, initial encounter       Relevant Orders   B. burgdorfi antibodies   Rocky mtn spotted fvr ab, IgM-blood   Myalgia       Relevant Orders   Rheumatoid Factor   Antinuclear Antib (ANA)      Follow-up: Return in about 6 months (around 01/17/2020), or if symptoms worsen or fail to improve.  [name], DO

## 2019-07-17 NOTE — Assessment & Plan Note (Signed)
Tolerating statin, encouraged heart healthy diet, avoid trans fats, minimize simple carbs and saturated fats. Increase exercise as tolerated 

## 2019-07-17 NOTE — Assessment & Plan Note (Signed)
Pt f/u with urology

## 2019-07-17 NOTE — Patient Instructions (Signed)

## 2019-07-17 NOTE — Assessment & Plan Note (Signed)
Per cardiology 

## 2019-07-17 NOTE — Assessment & Plan Note (Signed)
Well controlled, no changes to meds. Encouraged heart healthy diet such as the DASH diet and exercise as tolerated.  °

## 2019-07-19 LAB — ANA: Anti Nuclear Antibody (ANA): NEGATIVE

## 2019-07-19 LAB — RHEUMATOID FACTOR: Rheumatoid fact SerPl-aCnc: <14 IU/mL (ref <14)

## 2019-07-19 LAB — B. BURGDORFI ANTIBODIES: B burgdorferi Ab IgG+IgM: <0.9 index

## 2019-08-05 DIAGNOSIS — Z23 Encounter for immunization: Secondary | ICD-10-CM | POA: Diagnosis not present

## 2019-08-21 ENCOUNTER — Ambulatory Visit (INDEPENDENT_AMBULATORY_CARE_PROVIDER_SITE_OTHER): Payer: Medicare Other | Admitting: Cardiovascular Disease

## 2019-08-21 ENCOUNTER — Encounter: Payer: Self-pay | Admitting: Cardiovascular Disease

## 2019-08-21 ENCOUNTER — Other Ambulatory Visit: Payer: Self-pay

## 2019-08-21 VITALS — BP 131/67 | HR 57 | Ht 68.0 in | Wt 156.4 lb

## 2019-08-21 DIAGNOSIS — M25475 Effusion, left foot: Secondary | ICD-10-CM

## 2019-08-21 DIAGNOSIS — Z7901 Long term (current) use of anticoagulants: Secondary | ICD-10-CM

## 2019-08-21 DIAGNOSIS — I440 Atrioventricular block, first degree: Secondary | ICD-10-CM

## 2019-08-21 DIAGNOSIS — M25476 Effusion, unspecified foot: Secondary | ICD-10-CM | POA: Diagnosis not present

## 2019-08-21 DIAGNOSIS — I480 Paroxysmal atrial fibrillation: Secondary | ICD-10-CM

## 2019-08-21 DIAGNOSIS — M25474 Effusion, right foot: Secondary | ICD-10-CM

## 2019-08-21 DIAGNOSIS — M25473 Effusion, unspecified ankle: Secondary | ICD-10-CM | POA: Diagnosis not present

## 2019-08-21 DIAGNOSIS — I10 Essential (primary) hypertension: Secondary | ICD-10-CM | POA: Diagnosis not present

## 2019-08-21 MED ORDER — HYDROCHLOROTHIAZIDE 12.5 MG PO CAPS: 12.5000 mg | ORAL_CAPSULE | ORAL | 3 refills | Status: DC | PRN

## 2019-08-21 NOTE — Progress Notes (Signed)
Cardiology Office Note    Date:  08/21/2019   ID:  Mark Baugh., DOB 03/10/1928, MRN [id number]  PCP:  [name], [name], DO  Cardiologist:  Shelva Majestic, MD   F/U cardiology evaluation, initally referred through the courtesy of Dr. Roma Schanz for evaluation of an irregular rhythm in atrial fibrillation.  History of Present Illness:  Mark Babler. is a 85 y.o. male who was referred by Dr. [name] for atrial fibrillation.  I saw him as an add-on on 01/24/2018.  He has been seen on several occasions since and was last seen in February 2020.  He presents for a follow-up evaluation.  Mark Stark is a young appearing 85 year old male who remotely had seen Dr. Percival Spanish on one occasion in 2012.  He was noted have a mild slight heart murmur.  Over the last several years he has continued to be active.  He typically walks for 1 hour every morning.  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  On Christmas Eve 2018, he developed a sensation of an irregular rhythm.  His heart rhythm has persisted.  He had seen Dr. Roma Schanz and was found to be in atrial fibrillation.  He was started on eliquis 5 mg twice a day for anticoagulation.  He has been taking metoprolol succinate 100 mg daily, lisinopril 5 mg, and doxazocin 4 mg for hypertension.  He is on simvastatin 20 mg daily for hyperlipidemia.  When I saw him, he had just started to wear a Holter monitor.   His initial strips confirmed persistent atrial fibrillation.  A [facility] service read out from 01/24/2018 at 450:54 AM has revealed a 3.1 second pause while the patient was sleeping. [name]  He denied any chest pain, pesyncope, PND, orthopnea.  He had experience mild fatigue and slight shortness of breath.  He denied issues with sleep.    He was started on eliquis for anticoagulation  on January 06, 2018.  He underwent a 2-D echo Doppler study on 01/21/2018 which showed an EF of 55-60%.  Ascending aorta was  upper normal at 40 mm.  He had moderate mitral regurgitation.  His left atrium measured 55 mm was interpreted as severe dilation.  LA volume was 56 mL's per meter squared. When I last saw himon 02/22/2018, he was maintaining atrial fibrillation and had been on anticoagulatio for 6 weeks.  He underwent successful DC cardioversion on 03/01/2018 and was successfully converted to sinus rhythm with 120 J synchronized countershock.  I saw him in follow-up on March 15, 2018, he was maintaining sinus rhythm and was without recurrent awareness of arrhythmia.  His ECG showed sinus bradycardia at 54 bpm with first-degree AV block.    When I last saw him in June 2019 he was remaining stable and was unaware of any recurrent atrial fibrillation.  He continues to be active working in a large garden and also pushing a Therapist, art.  Over the past several months, he has been on a regimen consisting of lisinopril 15 mg, metoprolol 100 mg daily, doxazosin 4 mg at bedtime.  He has been on fenofibrate 160 mg and simvastatin 20 mg for hyperlipidemia.  He has continued to be on Eliquis 5 mg twice a day.  He had sent me a letter last month which stated that his blood pressures at home were ranging between 116 and 148 with a median of 133.  His pulse was 59 and diastolic pressures  were in the 60s to 37s.  He continues to be doing well and denies recurrent palpitations or awareness of atrial fibrillation.    I saw him in October 2019.  During that evaluation I further titrated his lisinopril to 20 mg daily.  Over the past several months he has now been maintained on lisinopril 20 mg daily, Toprol-XL 100 mg, and Cardura 4 mg at bedtime.  He is very compulsive with his blood pressure readings.  Over this 75-monthperiod he had taken 79 blood pressure readings over 51 days mean blood pressure is 139/70 with a heart rate of 58 with his range systolic 1 209-258 and diastolic 633-00with heart rates between 50 and 70.  He was concerned that his  blood pressure is still elevated.   I last saw him in February 2020.  He has been on lisinopril 20 mg daily, Toprol-XL 100 mg, and doxazosin 4 mg at bedtime for hypertension.  Since his last office visit he has 222 blood pressure readings from February 26 through August 18, 2019.  His mean blood pressure of the 222 readings is 1762/26with a systolic range of 1333to 1545and diastolic range of 56 to 86.  His heart rate ranges 52 to 87.  At times he has begun to notice ankle swelling right greater than left for the past 6 weeks.  He had undergone follow-up laboratory with his primary physician on July 17, 2019.  Creatinine was 1.05.  Potassium was 4.2.  He feels well.  He remains active.  He works in the yard and garden.  He does aerobic activity.  He denies chest tightness or pressure.  He presents for evaluation.  Past Medical History:  Diagnosis Date  . Anxiety   . Diverticulitis   . Dyslipidemia   . Hypertension   . 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 Prostate cancer ([facility]     Past Surgical History:  Procedure Laterality Date  . CARDIOVERSION N/A 03/01/2018   Procedure: CARDIOVERSION;  Surgeon: KTroy Sine MD;  [facility];  Service: Cardiovascular;  Laterality: N/A;  . CATARACT EXTRACTION  01/27/2010   right  . CATARACT EXTRACTION  03/04/2010   left  . Prostate seed implants    . PROSTATE SURGERY      Current Medications: Outpatient Medications Prior to Visit  Medication Sig Dispense Refill  . apixaban (ELIQUIS) 5 MG TABS tablet Take 1 tablet (5 mg total) by mouth 2 (two) times daily. 180 tablet 3  . b complex vitamins tablet Take 1 tablet by mouth daily.      . Calcium Carbonate-Vitamin D (CALTRATE 600+D) 600-400 MG-UNIT per tablet Take 1 tablet by mouth daily.      . clorazepate (TRANXENE) 15 MG tablet TAKE 1/2 TABLET BY MOUTH 2 TIMES DAILY 30 tablet 2  . cromolyn (NASALCROM) 5.2 MG/ACT nasal spray 2 sprays by Nasal route 2 (two) times daily.      .[name] doxazosin (CARDURA) 4 MG tablet TAKE 1 TABLET BY MOUTH EVERYDAY AT BEDTIME 90 tablet 1  . fenofibrate 160 MG tablet TAKE 1 TABLET BY MOUTH EVERY DAY 90 tablet 1  . fish oil-omega-3 fatty acids 1000 MG capsule Take 4 g by mouth daily.     .Marland Kitchenlisinopril (PRINIVIL,ZESTRIL) 20 MG tablet Take 1 tablet (20 mg total) by mouth daily. 90 tablet 3  . metoprolol (TOPROL-XL) 200 MG 24 hr tablet TAKE 1/2 TABLET BY MOUTH EVERY DAY 45 tablet 3  .  Multiple Vitamins-Minerals (MULTIVITAMIN PO) Take 1 tablet by mouth daily.    . Multiple Vitamins-Minerals (PRESERVISION AREDS 2) CAPS 2 po qd (Patient taking differently: Take 2 capsules by mouth daily. )    . simvastatin (ZOCOR) 20 MG tablet TAKE 1 TABLET BY MOUTH EVERYDAY AT BEDTIME 90 tablet 1  . vitamin C (ASCORBIC ACID) 500 MG tablet Take 500 mg by mouth daily.    [name] FLUZONE HIGH-DOSE QUADRIVALENT 0.7 ML SUSY TO BE ADMINISTERED BY PHARMACIST FOR IMMUNIZATION     No facility-administered medications prior to visit.      Allergies:   Sulfonamide derivatives   Social History   Socioeconomic History  . Marital status: Married    Spouse name: Not on file  . Number of children: Not on file  . Years of education: Not on file  . Highest education level: Not on file  Occupational History  . Occupation: Professor---RETIRED    Employer: RETIRED    Comment: Retired  Scientific laboratory technician  . Financial resource strain: Not on file  . Food insecurity    Worry: Not on file    Inability: Not on file  . Transportation needs    Medical: Not on file    Non-medical: Not on file  Tobacco Use  . Smoking status: Never Smoker  . Smokeless tobacco: Never Used  Substance and Sexual Activity  . Alcohol use: No    Comment: Little wine occasionally   . Drug use: No  . Sexual activity: Never    Partners: Female  Lifestyle  . Physical activity    Days per week: Not on file    Minutes per session: Not on file  . Stress: Not on file  Relationships  . Social Product manager on phone: Not on file    Gets together: Not on file    Attends religious service: Not on file    Active member of club or organization: Not on file    Attends meetings of clubs or organizations: Not on file    Relationship status: Not on file  Other Topics Concern  . Not on file  Social History Narrative  . Not on file     Family History:  The patient's family history includes Alzheimer's disease in his mother; Cancer (age of onset: 61) in his brother; Dementia in his mother; Mental illness in his mother.  His father is deceased and had suffered a myocardial infarction and had 3 CVAs.  His mother died and had Alzheimer's disease.  ROS General: Negative; No fevers, chills, or night sweats;  HEENT: Negative; No changes in vision or hearing, sinus congestion, difficulty swallowing Pulmonary: Negative; No cough, wheezing, shortness of breath, hemoptysis Cardiovascular: See history of present illness GI: Negative; No nausea, vomiting, diarrhea, or abdominal pain GU: Negative; No dysuria, hematuria, or difficulty voiding Musculoskeletal: Negative; no myalgias, joint pain, or weakness Hematologic/Oncology: Negative; no easy bruising, bleeding Endocrine: Negative; no heat/cold intolerance; no diabetes Neuro: Negative; no changes in balance, headaches Skin: Negative; No rashes or skin lesions Psychiatric: Negative; No behavioral problems, depression Sleep: Negative; No snoring, daytime sleepiness, hypersomnolence, bruxism, restless legs, hypnogognic hallucinations, no cataplexy Other comprehensive 14 point system review is negative.   PHYSICAL EXAM:   VS:  BP 131/67   Pulse (!) 57   Ht 5' 8"  (1.727 m)   Wt 156 lb 6.4 oz (70.9 kg)   SpO2 (!) 68%   BMI 23.78 kg/m     Repeat blood pressure by me  124/66  Wt Readings from Last 3 Encounters:  08/21/19 156 lb 6.4 oz (70.9 kg)  07/17/19 152 lb 9.6 oz (69.2 kg)  02/18/19 160 lb 9.6 oz (72.8 kg)    General: Alert, oriented, no  distress.  Skin: normal turgor, no rashes, warm and dry HEENT: Normocephalic, atraumatic. Pupils equal round and reactive to light; sclera anicteric; extraocular muscles intact; Nose without nasal septal hypertrophy Mouth/Parynx benign; Mallinpatti scale 3 Neck: No JVD, no carotid bruits; normal carotid upstroke Lungs: clear to ausculatation and percussion; no wheezing or rales Chest wall: without tenderness to palpitation Heart: PMI not displaced, RRR, s1 s2 normal, 1/6 systolic murmur, no diastolic murmur, no rubs, gallops, thrills, or heaves Abdomen: soft, nontender; no hepatosplenomehaly, BS+; abdominal aorta nontender and not dilated by palpation. Back: no CVA tenderness Pulses 2+ Musculoskeletal: full range of motion, normal strength, no joint deformities Extremities: Bilateral ankle edema right greater than left; clubbing cyanosis, Homan's sign negative  Neurologic: grossly nonfocal; Cranial nerves grossly wnl Psychologic: Normal mood and affect   Studies/Labs Reviewed:   ECG (independently read by me): Sinus bradycardia 57 bpm.  First-degree AV block with PR 242 ms.  QTc interval 395 ms.  No ST T changes.  Early transition.  February 18, 2019 ECG (independently read by me):Sinus bradycardia at 56 bpm.  First-degree AV block.  Early transition..  PR 238 ms.  October 21, 2018 ECG (independently read by me): Sinus bradycardia 56 bpm; first-degree AV block with a PR interval of 236 ms.  No ectopy.  No ST segment changes.  March 15, 2018 ECG (independently read by me): sinus bradycardia 54 bpm.  First degree AV block with PR interval at 246 bili seconds.  No ectopy.  Normal QTc interval.  02/22/2018 EKG:  EKG is ordered today.  Atrial fibrillation at 81 bpm.  QTc interval 397 ms.  01/24/2018 ECG (independently read by me): atrial fibrillation at 73 bpm. QTc interval 387 ms.  No significant ST-T changes.  Recent Labs: BMP Latest Ref Rng & Units 07/17/2019 01/20/2019 07/18/2018   Glucose 70 - 99 mg/dL 98 81 89  BUN 6 - 23 mg/dL 25(H) 19 20  Creatinine 0.40 - 1.50 mg/dL 1.05 1.19(H) 1.08  BUN/Creat Ratio 6 - 22 (calc) - 16 19  Sodium 135 - 145 mEq/L 139 140 135  Potassium 3.5 - 5.1 mEq/L 4.2 4.6 4.8  Chloride 96 - 112 mEq/L 104 104 98  CO2 19 - 32 mEq/L 28 23 25   Calcium 8.4 - 10.5 mg/dL 9.2 9.4 9.2     Hepatic Function Latest Ref Rng & Units 07/17/2019 01/20/2019 07/12/2018  Total Protein 6.0 - 8.3 g/dL 6.0 6.5 6.6  Albumin 3.5 - 5.2 g/dL 4.1 - 4.3  AST 0 - 37 U/L 39(H) 30 27  ALT 0 - 53 U/L 23 17 21   Alk Phosphatase 39 - 117 U/L 34(L) - 29(L)  Total Bilirubin 0.2 - 1.2 mg/dL 0.8 0.9 1.0  Bilirubin, Direct 0.0 - 0.3 mg/dL - - -    CBC Latest Ref Rng & Units 02/27/2018 01/25/2018 12/22/2016  WBC 3.4 - 10.8 x10E3/uL 8.6 4.4 7.7  Hemoglobin 13.0 - 17.7 g/dL 14.1 15.8 14.1  Hematocrit 37.5 - 51.0 % 40.9 45.7 41.2  Platelets 150 - 379 x10E3/uL 229 166 208.0   Lab Results  Component Value Date   MCV 94 02/27/2018   MCV 95 01/25/2018   MCV 95.1 12/22/2016   Lab Results  Component Value Date   TSH 1.830 01/25/2018  Lab Results  Component Value Date   HGBA1C 5.0 05/21/2012     BNP No results found for: BNP  ProBNP No results found for: PROBNP   Lipid Panel     Component Value Date/Time   CHOL 99 07/17/2019 1119   TRIG 37.0 07/17/2019 1119   HDL 49.80 07/17/2019 1119   CHOLHDL 2 07/17/2019 1119   VLDL 7.4 07/17/2019 1119   LDLCALC 42 07/17/2019 1119   LDLCALC 32 01/11/2018 1454     RADIOLOGY: No results found.   Additional studies/ records that were reviewed today include:  I reviewed the prior evaluation in 2012.  I reviewed the primary care evaluation from [facility]. I reviewed the Holter monitor. ------------------------------------------------------------------- 01/21/2018  Echo Study Conclusions  - Left ventricle: The cavity size was normal. There was moderate   concentric hypertrophy. Systolic function was normal. The    estimated ejection fraction was in the range of 55% to 60%. Wall   motion was normal; there were no regional wall motion   abnormalities. The study was not technically sufficient to allow   evaluation of LV diastolic dysfunction due to atrial   fibrillation. - Aortic valve: Valve mobility was restricted. There was moderate   regurgitation. - Ascending aorta: The ascending aorta was upper normal size   measuring 40 mm. - Mitral valve: There was moderate regurgitation. - Left atrium: The atrium was severely dilated. - Right ventricle: The cavity size was normal. Wall thickness was   normal. Systolic function was normal. - Right atrium: The atrium was mildly dilated. - Tricuspid valve: There was moderate regurgitation. - Pulmonic valve: There was trivial regurgitation. - Pulmonary arteries: Systolic pressure was mildly increased. PA   peak pressure: 38 mm Hg (S). - Inferior vena cava: The vessel was normal in size. - Pericardium, extracardiac: There was no pericardial effusion.   ASSESSMENT:    1. Essential hypertension   2. Bilateral swelling of feet and ankles   3. Paroxysmal atrial fibrillation (HCC)   4. Anticoagulation adequate   5. First degree AV block     PLAN:  Mark Stark is a young appearing, active 85 year old gentleman who has a history of hypertension, hyperlipidemia, and developed new onset irregular heart rhythm on Christmas Eve 2018.  He was started on eliquis anticoagulation after seeing Dr. [name] and has been on therapy since 01/06/2018.  His echo Doppler study demonstrated  preserved LV function.   However, he had a dilated left atrium.  In 2012, his left atrial dimension was increased at 49 mm but this had further increased on his most recent echo in January 2019 at 55 mm. He underwent successful outpatient DC cardioversion and has been maintaining sinus rhythm since his procedure on 03/01/2018.  Fortunately, since his cardioversion he is maintaining sinus rhythm.   His ECG today confirms sinus bradycardia at 57 bpm with first-degree AV block with a PR interval at 242 ms.  His blood pressure remained stable and he has consistently been very compulsive with his blood pressure recordings.  His mean reading of 222 recordings is 131/62.  Blood pressure when taken by me in the office today was 124/66.  He does have ankle swelling right greater than left.  I have suggested the addition of support stockings below the knee of 20 to 30 mm.  If he still notes some edema I have given him a prescription for HCTZ 12.5 mg to have available and only take on a as needed basis.  I reviewed most  recent laboratory which was done by Dr. Roma Schanz.  Creatinine is stable at 1.05.  He continues to be on fenofibrate, simvastatin and omega-3 fatty acid for his lipids.  LDL cholesterol was 42 with total cholesterol 99 and triglycerides 37.  He continues to be active and is without exertional dyspnea or chest pain.  We will see him in 6 months for cardiology evaluation or sooner if problems arise.   Medication Adjustments/Labs and Tests Ordered: Current medicines are reviewed at length with the patient today.  Concerns regarding medicines are outlined above.  Medication changes, Labs and Tests ordered today are listed in the Patient Instructions below. Patient Instructions  Medication Instructions:  Start AS NEEDED HYDROCHLOROTHIAZIDE 12.5MG If you need a refill on your cardiac medications before your next appointment, please call your pharmacy.  Special Instructions: PLEASE PURCHASE AND WEAR COMPRESSION STOCKINGS DAILY AND OFF AT BEDTIME. Compression stockings are elastic socks that squeeze the legs. They help to increase blood flow to the legs and to decrease swelling in the legs from fluid retention, and reduce the chance of developing blood clots in the lower legs. WE HAVE PROVIDED WRITTEN RX.  Follow-Up: You will need a follow up appointment in 6 months.  Please call our  office 3 months in advance, November 2020 to schedule this appointment.  You may see Shelva Majestic, MD or one of the following Advanced Practice Providers on your designated Care Team: Mark Stark, Mark Stark . Mark Sharp, PA-C     At [facility], you and your health needs are our priority.  As part of our continuing mission to provide you with exceptional heart care, we have created designated Provider Care Teams.  These Care Teams include your primary Cardiologist (physician) and Advanced Practice Providers (APPs -  Physician Assistants and Nurse Practitioners) who all work together to provide you with the care you need, when you need it.  Thank you for choosing [facility] at [facility]!!        Signed, Shelva Majestic, MD  08/21/2019 12:54 PM    [facility] [address], [place], [place], [place]  [address] Phone: [phone]

## 2019-08-21 NOTE — Patient Instructions (Signed)
Medication Instructions:  Start AS NEEDED HYDROCHLOROTHIAZIDE 12.5MG  If you need a refill on your cardiac medications before your next appointment, please call your pharmacy.  Special Instructions: PLEASE PURCHASE AND WEAR COMPRESSION STOCKINGS DAILY AND OFF AT BEDTIME. Compression stockings are elastic socks that squeeze the legs. They help to increase blood flow to the legs and to decrease swelling in the legs from fluid retention, and reduce the chance of developing blood clots in the lower legs. WE HAVE PROVIDED WRITTEN RX.  Follow-Up: You will need a follow up appointment in 6 months.  Please call our office 3 months in advance, November 2020 to schedule this appointment.  You may see Shelva Majestic, MD or one of the following Advanced Practice Providers on your designated Care Team: [place], [place] . Fabian Sharp, PA-C     At [facility], you and your health needs are our priority.  As part of our continuing mission to provide you with exceptional heart care, we have created designated Provider Care Teams.  These Care Teams include your primary Cardiologist (physician) and Advanced Practice Providers (APPs -  Physician Assistants and Nurse Practitioners) who all work together to provide you with the care you need, when you need it.  Thank you for choosing [facility] at [facility]!!

## 2019-08-25 ENCOUNTER — Other Ambulatory Visit: Payer: Self-pay | Admitting: Family Medicine

## 2019-08-29 ENCOUNTER — Other Ambulatory Visit: Payer: Self-pay | Admitting: Family Medicine

## 2019-08-29 ENCOUNTER — Other Ambulatory Visit: Payer: Self-pay | Admitting: Cardiovascular Disease

## 2019-10-09 DIAGNOSIS — C61 Malignant neoplasm of prostate: Secondary | ICD-10-CM | POA: Diagnosis not present

## 2019-10-09 DIAGNOSIS — R351 Nocturia: Secondary | ICD-10-CM | POA: Diagnosis not present

## 2019-10-11 ENCOUNTER — Other Ambulatory Visit: Payer: Self-pay | Admitting: Cardiovascular Disease

## 2019-11-21 ENCOUNTER — Other Ambulatory Visit: Payer: Self-pay | Admitting: Family Medicine

## 2019-11-21 DIAGNOSIS — F411 Generalized anxiety disorder: Secondary | ICD-10-CM

## 2019-11-23 ENCOUNTER — Other Ambulatory Visit: Payer: Self-pay | Admitting: Family Medicine

## 2019-11-23 DIAGNOSIS — I10 Essential (primary) hypertension: Secondary | ICD-10-CM

## 2019-11-24 ENCOUNTER — Other Ambulatory Visit: Payer: Self-pay | Admitting: Family Medicine

## 2019-11-24 DIAGNOSIS — F411 Generalized anxiety disorder: Secondary | ICD-10-CM

## 2019-11-24 NOTE — Telephone Encounter (Signed)
Copied from [place] [id number]. Topic: Quick Communication - Rx Refill/Question >> Nov 24, 2019 12:41 PM Parke Poisson wrote: Medication:clorazepate (TRANXENE) 15 MG tablet   Has the patient contacted their pharmacy? Yes, James from pharmacy calling because they have not gotten a response  Preferred Pharmacy (with phone number or street name):[id number] [place], [place] [phone] (Phone) [phone] (Fax)    Agent: Please be advised that RX refills may take up to 3 business days. We ask that you follow-up with your pharmacy.

## 2019-11-24 NOTE — Telephone Encounter (Signed)
Refill request: clorazepate 15mg  Last refill: 07/17/2019, #30, 2 refills Last OV: 07/17/2019 Next OV: 01/19/2020  Please advise.

## 2019-11-24 NOTE — Telephone Encounter (Signed)
Copied from [place] [id number]. Topic: Quick Communication - Rx Refill/Question >> Nov 24, 2019 12:41 PM Parke Poisson wrote: Medication:clorazepate (TRANXENE) 15 MG tablet   Has the patient contacted their pharmacy? Yes, James from pharmacy calling because they have not gotten a response  Preferred Pharmacy (with phone number or street name):[id number] [place], [place] [phone] (Phone) [phone] (Fax)    Agent: Please be advised that RX refills may take up to 3 business days. We ask that you follow-up with your pharmacy.

## 2019-12-02 ENCOUNTER — Telehealth: Payer: Self-pay | Admitting: *Deleted

## 2019-12-02 DIAGNOSIS — I480 Paroxysmal atrial fibrillation: Secondary | ICD-10-CM

## 2019-12-02 MED ORDER — APIXABAN 5 MG PO TABS: 5.0000 mg | ORAL_TABLET | Freq: Two times a day (BID) | ORAL | 1 refills | Status: DC

## 2019-12-02 NOTE — Telephone Encounter (Signed)
Received Eliquis request dated 11/26/19 from [facility]. Refill sent.

## 2019-12-03 DIAGNOSIS — H35373 Puckering of macula, bilateral: Secondary | ICD-10-CM | POA: Insufficient documentation

## 2019-12-04 DIAGNOSIS — H353122 Nonexudative age-related macular degeneration, left eye, intermediate dry stage: Secondary | ICD-10-CM | POA: Diagnosis not present

## 2019-12-04 DIAGNOSIS — H35373 Puckering of macula, bilateral: Secondary | ICD-10-CM | POA: Diagnosis not present

## 2019-12-04 DIAGNOSIS — H43813 Vitreous degeneration, bilateral: Secondary | ICD-10-CM | POA: Diagnosis not present

## 2019-12-04 DIAGNOSIS — H35351 Cystoid macular degeneration, right eye: Secondary | ICD-10-CM | POA: Diagnosis not present

## 2019-12-04 DIAGNOSIS — Z961 Presence of intraocular lens: Secondary | ICD-10-CM | POA: Diagnosis not present

## 2019-12-04 DIAGNOSIS — H353211 Exudative age-related macular degeneration, right eye, with active choroidal neovascularization: Secondary | ICD-10-CM | POA: Diagnosis not present

## 2019-12-30 ENCOUNTER — Other Ambulatory Visit: Payer: Self-pay | Admitting: Family Medicine

## 2020-01-16 ENCOUNTER — Other Ambulatory Visit: Payer: Self-pay

## 2020-01-16 ENCOUNTER — Other Ambulatory Visit: Payer: Self-pay | Admitting: Cardiovascular Disease

## 2020-01-16 NOTE — Progress Notes (Signed)
Subjective:   Mark Stark. is a 86 y.o. male who presents for Medicare Annual/Subsequent preventive examination.  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  Currently enjoys woodworking. Still doing all of his on yard work..  Review of Systems:  Home Safety/Smoke Alarms: Feels safe in home. Smoke alarms in place.  Lives alone in 1 story home. Verbalizes good support system.   Male:   PSA-  Lab Results  Component Value Date   PSA 0.35 12/27/2009   PSA 0.38 09/22/2009       Objective:    Vitals: BP 140/80 (BP Location: Left Arm, Patient Position: Sitting, Cuff Size: Normal) Comment: all vitals done by H.Stevenson CMA  Pulse 60   Temp (!) 97 F (36.1 C)   Ht 5\' 8"  (1.727 m)   Wt 160 lb 7.9 oz (72.8 kg)   SpO2 98%   BMI 24.40 kg/m   Body mass index is 24.4 kg/m.  Advanced Directives 01/19/2020 01/14/2019 03/01/2018 12/22/2016  Does Patient Have a Medical Advance Directive? Yes Yes No Yes  Type of Paramedic of [place];Living will [place];Living will - Living will;Healthcare Power of Attorney  Does patient want to make changes to medical advance directive? No - Patient declined No - Patient declined - No - Patient declined  Copy of [place] in Chart? Yes - validated most recent copy scanned in chart (See row information) Yes - validated most recent copy scanned in chart (See row information) - Yes  Would patient like information on creating a medical advance directive? - - No - Patient declined -    Tobacco Social History   Tobacco Use  Smoking Status Never Smoker  Smokeless Tobacco Never Used     Counseling given: Not Answered   Clinical Intake:     Pain : No/denies pain                 Past Medical History:  Diagnosis Date  . Anxiety   . Diverticulitis   . Dyslipidemia   . Hypertension   . 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 Prostate cancer [facility])     Past Surgical History:  Procedure Laterality Date  . CARDIOVERSION N/A 03/01/2018   Procedure: CARDIOVERSION;  Surgeon: Troy Sine, MD;  Location: [facility] ENDOSCOPY;  Service: Cardiovascular;  Laterality: N/A;  . CATARACT EXTRACTION  01/27/2010   right  . CATARACT EXTRACTION  03/04/2010   left  . Prostate seed implants    . PROSTATE SURGERY     Family History  Problem Relation Age of Onset  . Dementia Mother   . Alzheimer's disease Mother   . Mental illness Mother        ALZ DISEASE  . Cancer Brother 43       pancreatic cancer   Social History   Socioeconomic History  . Marital status: Married    Spouse name: Not on file  . Number of children: Not on file  . Years of education: Not on file  . Highest education level: Not on file  Occupational History  . Occupation: Professor---RETIRED    Employer: RETIRED    Comment: Retired  Tobacco Use  . Smoking status: Never Smoker  . Smokeless tobacco: Never Used  Substance and Sexual Activity  . Alcohol use: No    Comment: Little wine occasionally   . Drug use: No  . Sexual activity: Never    Partners: Female  Other Topics Concern  . Not on file  Social History Narrative  . Not on file   Social Determinants of Health   Financial Resource Strain:   . Difficulty of Paying Living Expenses: Not on file  Food Insecurity:   . Worried About Charity fundraiser in the Last Year: Not on file  . Ran Out of Food in the Last Year: Not on file  Transportation Needs:   . Lack of Transportation (Medical): Not on file  . Lack of Transportation (Non-Medical): Not on file  Physical Activity:   . Days of Exercise per Week: Not on file  . Minutes of Exercise per Session: Not on file  Stress:   . Feeling of Stress : Not on file  Social Connections:   . Frequency of Communication with Friends and Family: Not on file  . Frequency of Social Gatherings with Friends and Family: Not on file  . Attends Religious Services: Not on file  .  Active Member of Clubs or Organizations: Not on file  . Attends Archivist Meetings: Not on file  . Marital Status: Not on file    Outpatient Encounter Medications as of 01/19/2020  Medication Sig  . apixaban (ELIQUIS) 5 MG TABS tablet Take 1 tablet (5 mg total) by mouth 2 (two) times daily.  [name] b complex vitamins tablet Take 1 tablet by mouth daily.    . Calcium Carbonate-Vitamin D (CALTRATE 600+D) 600-400 MG-UNIT per tablet Take 1 tablet by mouth daily.    . clorazepate (TRANXENE) 15 MG tablet TAKE 1/2 TABLET BY MOUTH 2 TIMES DAILY  . cromolyn (NASALCROM) 5.2 MG/ACT nasal spray 2 sprays by Nasal route 2 (two) times daily.    [name] doxazosin (CARDURA) 4 MG tablet TAKE 1 TABLET BY MOUTH EVERYDAY AT BEDTIME  . fenofibrate 160 MG tablet TAKE 1 TABLET BY MOUTH EVERY DAY  . fish oil-omega-3 fatty acids 1000 MG capsule Take 4 g by mouth daily.   [name] lisinopril (ZESTRIL) 20 MG tablet TAKE 1 TABLET BY MOUTH EVERY DAY  . metoprolol (TOPROL-XL) 200 MG 24 hr tablet TAKE 1/2 TABLET BY MOUTH EVERY DAY  . Multiple Vitamins-Minerals (MULTIVITAMIN PO) Take 1 tablet by mouth daily.  . Multiple Vitamins-Minerals (PRESERVISION AREDS 2) CAPS 2 po qd (Patient taking differently: Take 2 capsules by mouth daily. )  . simvastatin (ZOCOR) 20 MG tablet TAKE 1 TABLET BY MOUTH EVERYDAY AT BEDTIME  . vitamin C (ASCORBIC ACID) 500 MG tablet Take 500 mg by mouth daily.  . hydrochlorothiazide (MICROZIDE) 12.5 MG capsule TAKE 1 CAPSULE (12.5 MG TOTAL) BY MOUTH AS NEEDED (SWELLING).  . [DISCONTINUED] lisinopril (ZESTRIL) 20 MG tablet TAKE 1 TABLET BY MOUTH EVERY DAY   No facility-administered encounter medications on file as of 01/19/2020.    Activities of Daily Living In your present state of health, do you have any difficulty performing the following activities: 01/19/2020  Hearing? Y  Comment wears hearing aids  Vision? N  Difficulty concentrating or making decisions? N  Walking or climbing stairs? N  Dressing  or bathing? N  Doing errands, shopping? N  Preparing Food and eating ? N  Using the Toilet? N  In the past six months, have you accidently leaked urine? N  Do you have problems with loss of bowel control? N  Managing your Medications? N  Managing your Finances? N  Housekeeping or managing your Housekeeping? N  Some recent data might be hidden    Patient Care Team: [name],  [name], DO as PCP - General Troy Sine, MD as PCP - Cardiology (Cardiology) Gerarda Fraction, MD as Referring Physician (Ophthalmology) Alyson Ingles Candee Furbish, MD as Consulting Physician (Urology) Druscilla Brownie, MD as Consulting Physician (Dermatology)   Assessment:   This is a routine wellness examination for [place]. Physical assessment deferred to PCP.   Exercise Activities and Dietary recommendations Current Exercise Habits: Home exercise routine, Type of exercise: walking;stretching, Time (Minutes): 60, Frequency (Times/Week): 6, Weekly Exercise (Minutes/Week): 360, Intensity: Mild, Exercise limited by: None identified Diet (meal preparation, eat out, water intake, caffeinated beverages, dairy products, fruits and vegetables): in general, a "healthy" diet  , well balanced  Goals    . Maintain healthy diet and continue to exercise 6-7 days per week.       Fall Risk Fall Risk  01/19/2020 01/14/2019 12/22/2016 12/21/2015 12/08/2014  Falls in the past year? 0 0 No No No  Number falls in past yr: 0 - - - -  Injury with Fall? 0 - - - -  Risk for fall due to : - - History of fall(s);Impaired vision - -  Follow up Education provided;Falls prevention discussed - - - -     Depression Screen PHQ 2/9 Scores 01/19/2020 01/14/2019 12/22/2016 12/21/2015  PHQ - 2 Score 0 0 0 1    Cognitive Function Ad8 score reviewed for issues:  Issues making decisions:no  Less interest in hobbies / activities:no  Repeats questions, stories (family complaining):no  Trouble using ordinary gadgets (microwave, computer,  phone):no  Forgets the month or year: no  Mismanaging finances: no  Remembering appts:no  Daily problems with thinking and/or memory:no Ad8 score is=0   MMSE - Mini Mental State Exam 01/14/2019 12/22/2016  Orientation to time 5 5  Orientation to Place 5 5  Registration 3 3  Attention/ Calculation 5 5  Recall 3 3  Language- name 2 objects 2 2  Language- repeat 1 1  Language- follow 3 step command 3 3  Language- read & follow direction 1 1  Write a sentence 1 1  Copy design 1 1  Total score 30 30        Immunization History  Administered Date(s) Administered  . Influenza Whole 11/14/2007, 09/30/2008, 09/08/2009, 08/31/2010, 09/03/2013  . Influenza, High Dose Seasonal PF 08/19/2015, 09/07/2017, 08/05/2019  . Influenza-Unspecified 09/07/2014, 09/15/2016  . PFIZER SARS-COV-2 Vaccination 01/18/2020  . Pneumococcal Conjugate-13 09/07/2014  . Pneumococcal Polysaccharide-23 09/22/2009  . Tdap 12/25/2012  . Zoster 03/19/2008   Screening Tests Health Maintenance  Topic Date Due  . TETANUS/TDAP  12/25/2022  . INFLUENZA VACCINE  Completed  . PNA vac Low Risk Adult  Completed       Plan:    Please schedule your next medicare wellness visit with me in 1 yr.  Continue to eat heart healthy diet (full of fruits, vegetables, whole grains, lean protein, water--limit salt, fat, and sugar intake) and increase physical activity as tolerated.  Continue doing brain stimulating activities (puzzles, reading, adult coloring books, staying active) to keep memory sharp.     I have personally reviewed and noted the following in the patient's chart:   . Medical and social history . Use of alcohol, tobacco or illicit drugs  . Current medications and supplements . Functional ability and status . Nutritional status . Physical activity . Advanced directives . List of other physicians . Hospitalizations, surgeries, and ER visits in previous 12 months . Vitals . Screenings to include  cognitive, depression, and falls . Referrals  and appointments  In addition, I have reviewed and discussed with patient certain preventive protocols, quality metrics, and best practice recommendations. A written personalized care plan for preventive services as well as general preventive health recommendations were provided to patient.     Shela Nevin, [place]  01/19/2020

## 2020-01-18 ENCOUNTER — Ambulatory Visit: Payer: Medicare Other | Attending: Internal Medicine

## 2020-01-18 DIAGNOSIS — Z23 Encounter for immunization: Secondary | ICD-10-CM

## 2020-01-18 NOTE — Progress Notes (Signed)
   [id number]  Name:  Mark Stark.    MRN: [id number] DOB: 1928-08-25  01/18/2020  Mr. Mark Stark was observed post Covid-19 immunization for 15 minutes without incidence. He was provided with Vaccine Information Sheet and instruction to access the V-Safe system.   Mr. Mark Stark was instructed to call 911 with any severe reactions post vaccine: [name] Difficulty breathing  . Swelling of your face and throat  . A fast heartbeat  . A bad rash all over your body  . Dizziness and weakness    Immunizations Administered    Name Date Dose VIS Date Route   Pfizer COVID-19 Vaccine 01/18/2020  1:47 PM 0.3 mL 12/05/2019 Intramuscular   Manufacturer: [place]   Lot: [id number]   [place]: [id number]

## 2020-01-19 ENCOUNTER — Ambulatory Visit (INDEPENDENT_AMBULATORY_CARE_PROVIDER_SITE_OTHER): Payer: Medicare Other | Admitting: Family Medicine

## 2020-01-19 ENCOUNTER — Other Ambulatory Visit: Payer: Self-pay

## 2020-01-19 ENCOUNTER — Ambulatory Visit (INDEPENDENT_AMBULATORY_CARE_PROVIDER_SITE_OTHER): Payer: Medicare Other | Admitting: *Deleted

## 2020-01-19 ENCOUNTER — Encounter: Payer: Self-pay | Admitting: Family Medicine

## 2020-01-19 ENCOUNTER — Encounter: Payer: Self-pay | Admitting: *Deleted

## 2020-01-19 ENCOUNTER — Other Ambulatory Visit: Payer: Self-pay | Admitting: Family Medicine

## 2020-01-19 VITALS — BP 140/80 | HR 60 | Temp 97.0°F | Resp 18 | Ht 68.0 in | Wt 160.4 lb

## 2020-01-19 VITALS — BP 140/80 | HR 60 | Temp 97.0°F | Ht 68.0 in | Wt 160.5 lb

## 2020-01-19 DIAGNOSIS — I480 Paroxysmal atrial fibrillation: Secondary | ICD-10-CM | POA: Diagnosis not present

## 2020-01-19 DIAGNOSIS — E875 Hyperkalemia: Secondary | ICD-10-CM

## 2020-01-19 DIAGNOSIS — H3530 Unspecified macular degeneration: Secondary | ICD-10-CM | POA: Diagnosis not present

## 2020-01-19 DIAGNOSIS — E785 Hyperlipidemia, unspecified: Secondary | ICD-10-CM

## 2020-01-19 DIAGNOSIS — Z0000 Encounter for general adult medical examination without abnormal findings: Secondary | ICD-10-CM

## 2020-01-19 DIAGNOSIS — F411 Generalized anxiety disorder: Secondary | ICD-10-CM | POA: Diagnosis not present

## 2020-01-19 DIAGNOSIS — I10 Essential (primary) hypertension: Secondary | ICD-10-CM

## 2020-01-19 LAB — LIPID PANEL
Cholesterol: 97 mg/dL (ref 0–200)
HDL: 46.7 mg/dL (ref >39.00)
LDL Cholesterol: 42 mg/dL (ref 0–99)
NonHDL: 50.36
Total CHOL/HDL Ratio: 2
Triglycerides: 44 mg/dL (ref 0.0–149.0)
VLDL: 8.8 mg/dL (ref 0.0–40.0)

## 2020-01-19 LAB — COMPREHENSIVE METABOLIC PANEL
ALT: 21 U/L (ref 0–53)
AST: 29 U/L (ref 0–37)
Albumin: 4.2 g/dL (ref 3.5–5.2)
Alkaline Phosphatase: 39 U/L (ref 39–117)
BUN: 20 mg/dL (ref 6–23)
CO2: 29 mEq/L (ref 19–32)
Calcium: 9.7 mg/dL (ref 8.4–10.5)
Chloride: 104 mEq/L (ref 96–112)
Creatinine, Ser: 1.13 mg/dL (ref 0.40–1.50)
GFR: 61.03 mL/min (ref >60.00)
Glucose, Bld: 94 mg/dL (ref 70–99)
Potassium: 5.5 mEq/L — ABNORMAL HIGH (ref 3.5–5.1)
Sodium: 137 mEq/L (ref 135–145)
Total Bilirubin: 0.8 mg/dL (ref 0.2–1.2)
Total Protein: 6.2 g/dL (ref 6.0–8.3)

## 2020-01-19 NOTE — Assessment & Plan Note (Signed)
Stable-- controlled Per cardio

## 2020-01-19 NOTE — Assessment & Plan Note (Signed)
Well controlled, no changes to meds. Encouraged heart healthy diet such as the DASH diet and exercise as tolerated.  °

## 2020-01-19 NOTE — Patient Instructions (Signed)
Please schedule your next medicare wellness visit with me in 1 yr.  Continue to eat heart healthy diet (full of fruits, vegetables, whole grains, lean protein, water--limit salt, fat, and sugar intake) and increase physical activity as tolerated.  Continue doing brain stimulating activities (puzzles, reading, adult coloring books, staying active) to keep memory sharp.    Mark Stark , Thank you for taking time to come for your Medicare Wellness Visit. I appreciate your ongoing commitment to your health goals. Please review the following plan we discussed and let me know if I can assist you in the future.   These are the goals we discussed: Goals    . Maintain healthy diet and continue to exercise 6-7 days per week.       This is a list of the screening recommended for you and due dates:  Health Maintenance  Topic Date Due  . Tetanus Vaccine  12/25/2022  . Flu Shot  Completed  . Pneumonia vaccines  Completed    Preventive Care 4 Years and Older, Male Preventive care refers to lifestyle choices and visits with your health care provider that can promote health and wellness. This includes:  A yearly physical exam. This is also called an annual well check.  Regular dental and eye exams.  Immunizations.  Screening for certain conditions.  Healthy lifestyle choices, such as diet and exercise. What can I expect for my preventive care visit? Physical exam Your health care provider will check:  Height and weight. These may be used to calculate body mass index (BMI), which is a measurement that tells if you are at a healthy weight.  Heart rate and blood pressure.  Your skin for abnormal spots. Counseling Your health care provider may ask you questions about:  Alcohol, tobacco, and drug use.  Emotional well-being.  Home and relationship well-being.  Sexual activity.  Eating habits.  History of falls.  Memory and ability to understand (cognition).  Work and work  Statistician. What immunizations do I need?  Influenza (flu) vaccine  This is recommended every year. Tetanus, diphtheria, and pertussis (Tdap) vaccine  You may need a Td booster every 10 years. Varicella (chickenpox) vaccine  You may need this vaccine if you have not already been vaccinated. Zoster (shingles) vaccine  You may need this after age 24. Pneumococcal conjugate (PCV13) vaccine  One dose is recommended after age 68. Pneumococcal polysaccharide (PPSV23) vaccine  One dose is recommended after age 69. Measles, mumps, and rubella (MMR) vaccine  You may need at least one dose of MMR if you were born in 1957 or later. You may also need a second dose. Meningococcal conjugate (MenACWY) vaccine  You may need this if you have certain conditions. Hepatitis A vaccine  You may need this if you have certain conditions or if you travel or work in places where you may be exposed to hepatitis A. Hepatitis B vaccine  You may need this if you have certain conditions or if you travel or work in places where you may be exposed to hepatitis B. Haemophilus influenzae type b (Hib) vaccine  You may need this if you have certain conditions. You may receive vaccines as individual doses or as more than one vaccine together in one shot (combination vaccines). Talk with your health care provider about the risks and benefits of combination vaccines. What tests do I need? Blood tests  Lipid and cholesterol levels. These may be checked every 5 years, or more frequently depending on your overall health.  Hepatitis C test.  Hepatitis B test. Screening  Lung cancer screening. You may have this screening every year starting at age 35 if you have a 30-pack-year history of smoking and currently smoke or have quit within the past 15 years.  Colorectal cancer screening. All adults should have this screening starting at age 79 and continuing until age 70. Your health care provider may recommend  screening at age 43 if you are at increased risk. You will have tests every 1-10 years, depending on your results and the type of screening test.  Prostate cancer screening. Recommendations will vary depending on your family history and other risks.  Diabetes screening. This is done by checking your blood sugar (glucose) after you have not eaten for a while (fasting). You may have this done every 1-3 years.  Abdominal aortic aneurysm (AAA) screening. You may need this if you are a current or former smoker.  Sexually transmitted disease (STD) testing. Follow these instructions at home: Eating and drinking  Eat a diet that includes fresh fruits and vegetables, whole grains, lean protein, and low-fat dairy products. Limit your intake of foods with high amounts of sugar, saturated fats, and salt.  Take vitamin and mineral supplements as recommended by your health care provider.  Do not drink alcohol if your health care provider tells you not to drink.  If you drink alcohol: ? Limit how much you have to 0-2 drinks a day. ? Be aware of how much alcohol is in your drink. In the [place] one drink equals one 12 oz bottle of beer (355 mL), one 5 oz glass of wine (148 mL), or one 1 oz glass of hard liquor (44 mL). Lifestyle  Take daily care of your teeth and gums.  Stay active. Exercise for at least 30 minutes on 5 or more days each week.  Do not use any products that contain nicotine or tobacco, such as cigarettes, e-cigarettes, and chewing tobacco. If you need help quitting, ask your health care provider.  If you are sexually active, practice safe sex. Use a condom or other form of protection to prevent STIs (sexually transmitted infections).  Talk with your health care provider about taking a low-dose aspirin or statin. What's next?  Visit your health care provider once a year for a well check visit.  Ask your health care provider how often you should have your eyes and teeth  checked.  Stay up to date on all vaccines. This information is not intended to replace advice given to you by your health care provider. Make sure you discuss any questions you have with your health care provider. Document Revised: 12/05/2018 Document Reviewed: 12/05/2018 Elsevier Patient Education  2020 [employer].

## 2020-01-19 NOTE — Assessment & Plan Note (Signed)
Per opth 

## 2020-01-19 NOTE — Patient Instructions (Signed)
DASH Eating Plan DASH stands for "Dietary Approaches to Stop Hypertension." The DASH eating plan is a healthy eating plan that has been shown to reduce high blood pressure (hypertension). It may also reduce your risk for type 2 diabetes, heart disease, and stroke. The DASH eating plan may also help with weight loss. What are tips for following this plan?  General guidelines  Avoid eating more than 2,300 mg (milligrams) of salt (sodium) a day. If you have hypertension, you may need to reduce your sodium intake to 1,500 mg a day.  Limit alcohol intake to no more than 1 drink a day for nonpregnant women and 2 drinks a day for men. One drink equals 12 oz of beer, 5 oz of wine, or 1 oz of hard liquor.  Work with your health care provider to maintain a healthy body weight or to lose weight. Ask what an ideal weight is for you.  Get at least 30 minutes of exercise that causes your heart to beat faster (aerobic exercise) most days of the week. Activities may include walking, swimming, or biking.  Work with your health care provider or diet and nutrition specialist (dietitian) to adjust your eating plan to your individual calorie needs. Reading food labels   Check food labels for the amount of sodium per serving. Choose foods with less than 5 percent of the Daily Value of sodium. Generally, foods with less than 300 mg of sodium per serving fit into this eating plan.  To find whole grains, look for the word "whole" as the first word in the ingredient list. Shopping  Buy products labeled as "low-sodium" or "no salt added."  Buy fresh foods. Avoid canned foods and premade or frozen meals. Cooking  Avoid adding salt when cooking. Use salt-free seasonings or herbs instead of table salt or sea salt. Check with your health care provider or pharmacist before using salt substitutes.  Do not fry foods. Cook foods using healthy methods such as baking, boiling, grilling, and broiling instead.  Cook with  heart-healthy oils, such as olive, canola, soybean, or sunflower oil. Meal planning  Eat a balanced diet that includes: ? 5 or more servings of fruits and vegetables each day. At each meal, try to fill half of your plate with fruits and vegetables. ? Up to 6-8 servings of whole grains each day. ? Less than 6 oz of lean meat, poultry, or fish each day. A 3-oz serving of meat is about the same size as a deck of cards. One egg equals 1 oz. ? 2 servings of low-fat dairy each day. ? A serving of nuts, seeds, or beans 5 times each week. ? Heart-healthy fats. Healthy fats called Omega-3 fatty acids are found in foods such as flaxseeds and coldwater fish, like sardines, salmon, and mackerel.  Limit how much you eat of the following: ? Canned or prepackaged foods. ? Food that is high in trans fat, such as fried foods. ? Food that is high in saturated fat, such as fatty meat. ? Sweets, desserts, sugary drinks, and other foods with added sugar. ? Full-fat dairy products.  Do not salt foods before eating.  Try to eat at least 2 vegetarian meals each week.  Eat more home-cooked food and less restaurant, buffet, and fast food.  When eating at a restaurant, ask that your food be prepared with less salt or no salt, if possible. What foods are recommended? The items listed may not be a complete list. Talk with your dietitian about   what dietary choices are best for you. Grains Whole-grain or whole-wheat bread. Whole-grain or whole-wheat pasta. Brown rice. Oatmeal. Quinoa. Bulgur. Whole-grain and low-sodium cereals. Pita bread. Low-fat, low-sodium crackers. Whole-wheat flour tortillas. Vegetables Fresh or frozen vegetables (raw, steamed, roasted, or grilled). Low-sodium or reduced-sodium tomato and vegetable juice. Low-sodium or reduced-sodium tomato sauce and tomato paste. Low-sodium or reduced-sodium canned vegetables. Fruits All fresh, dried, or frozen fruit. Canned fruit in natural juice (without  added sugar). Meat and other protein foods Skinless chicken or turkey. Ground chicken or turkey. Pork with fat trimmed off. Fish and seafood. Egg whites. Dried beans, peas, or lentils. Unsalted nuts, nut butters, and seeds. Unsalted canned beans. Lean cuts of beef with fat trimmed off. Low-sodium, lean deli meat. Dairy Low-fat (1%) or fat-free (skim) milk. Fat-free, low-fat, or reduced-fat cheeses. Nonfat, low-sodium ricotta or cottage cheese. Low-fat or nonfat yogurt. Low-fat, low-sodium cheese. Fats and oils Soft margarine without trans fats. Vegetable oil. Low-fat, reduced-fat, or light mayonnaise and salad dressings (reduced-sodium). Canola, safflower, olive, soybean, and sunflower oils. Avocado. Seasoning and other foods Herbs. Spices. Seasoning mixes without salt. Unsalted popcorn and pretzels. Fat-free sweets. What foods are not recommended? The items listed may not be a complete list. Talk with your dietitian about what dietary choices are best for you. Grains Baked goods made with fat, such as croissants, muffins, or some breads. Dry pasta or rice meal packs. Vegetables Creamed or fried vegetables. Vegetables in a cheese sauce. Regular canned vegetables (not low-sodium or reduced-sodium). Regular canned tomato sauce and paste (not low-sodium or reduced-sodium). Regular tomato and vegetable juice (not low-sodium or reduced-sodium). Pickles. Olives. Fruits Canned fruit in a light or heavy syrup. Fried fruit. Fruit in cream or butter sauce. Meat and other protein foods Fatty cuts of meat. Ribs. Fried meat. Bacon. Sausage. Bologna and other processed lunch meats. Salami. Fatback. Hotdogs. Bratwurst. Salted nuts and seeds. Canned beans with added salt. Canned or smoked fish. Whole eggs or egg yolks. Chicken or turkey with skin. Dairy Whole or 2% milk, cream, and half-and-half. Whole or full-fat cream cheese. Whole-fat or sweetened yogurt. Full-fat cheese. Nondairy creamers. Whipped toppings.  Processed cheese and cheese spreads. Fats and oils Butter. Stick margarine. Lard. Shortening. Ghee. Bacon fat. Tropical oils, such as coconut, palm kernel, or palm oil. Seasoning and other foods Salted popcorn and pretzels. Onion salt, garlic salt, seasoned salt, table salt, and sea salt. Worcestershire sauce. Tartar sauce. Barbecue sauce. Teriyaki sauce. Soy sauce, including reduced-sodium. Steak sauce. Canned and packaged gravies. Fish sauce. Oyster sauce. Cocktail sauce. Horseradish that you find on the shelf. Ketchup. Mustard. Meat flavorings and tenderizers. Bouillon cubes. Hot sauce and Tabasco sauce. Premade or packaged marinades. Premade or packaged taco seasonings. Relishes. Regular salad dressings. Where to find more information:  National Heart, Lung, and Blood Institute: www.nhlbi.nih.gov  American Heart Association: www.heart.org Summary  The DASH eating plan is a healthy eating plan that has been shown to reduce high blood pressure (hypertension). It may also reduce your risk for type 2 diabetes, heart disease, and stroke.  With the DASH eating plan, you should limit salt (sodium) intake to 2,300 mg a day. If you have hypertension, you may need to reduce your sodium intake to 1,500 mg a day.  When on the DASH eating plan, aim to eat more fresh fruits and vegetables, whole grains, lean proteins, low-fat dairy, and heart-healthy fats.  Work with your health care provider or diet and nutrition specialist (dietitian) to adjust your eating plan to your   individual calorie needs. This information is not intended to replace advice given to you by your health care provider. Make sure you discuss any questions you have with your health care provider. Document Revised: 11/23/2017 Document Reviewed: 12/04/2016 Elsevier Patient Education  2020 Elsevier Inc.  

## 2020-01-19 NOTE — Assessment & Plan Note (Signed)
Stable con't meds 

## 2020-01-19 NOTE — Progress Notes (Signed)
Patient ID: Mark Stark., male    DOB: Jan 19, 1928  Age: 86 y.o. MRN: [id number]    Subjective:  Subjective  HPI Mark Stark. presents for f/u of bp , chol and anxiety.  Pt has not complaints.  He will have his medicare wellness visit to day as well .     Review of Systems  Constitutional: Positive for fatigue. Negative for unexpected weight change.  Respiratory: Negative for cough and shortness of breath.   Cardiovascular: Negative for chest pain and palpitations.    History Past Medical History:  Diagnosis Date  . Anxiety   . Diverticulitis   . Dyslipidemia   . Hypertension   . 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 Prostate cancer [facility])     He has a past surgical history that includes Cataract extraction (01/27/2010); Cataract extraction (03/04/2010); Prostate seed implants; Prostate surgery; and Cardioversion (N/A, 03/01/2018).   His family history includes Alzheimer's disease in his mother; Cancer (age of onset: 86) in his brother; Dementia in his mother; Mental illness in his mother.He reports that he has never smoked. He has never used smokeless tobacco. He reports that he does not drink alcohol or use drugs.  Current Outpatient Medications on File Prior to Visit  Medication Sig Dispense Refill  . apixaban (ELIQUIS) 5 MG TABS tablet Take 1 tablet (5 mg total) by mouth 2 (two) times daily. 180 tablet 1  . b complex vitamins tablet Take 1 tablet by mouth daily.      . Calcium Carbonate-Vitamin D (CALTRATE 600+D) 600-400 MG-UNIT per tablet Take 1 tablet by mouth daily.      . clorazepate (TRANXENE) 15 MG tablet TAKE 1/2 TABLET BY MOUTH 2 TIMES DAILY 30 tablet 2  . cromolyn (NASALCROM) 5.2 MG/ACT nasal spray 2 sprays by Nasal route 2 (two) times daily.      [name] doxazosin (CARDURA) 4 MG tablet TAKE 1 TABLET BY MOUTH EVERYDAY AT BEDTIME 90 tablet 1  . fenofibrate 160 MG tablet TAKE 1 TABLET BY MOUTH EVERY DAY 90 tablet 1  . fish oil-omega-3 fatty acids 1000 MG capsule  Take 4 g by mouth daily.     [name] lisinopril (ZESTRIL) 20 MG tablet TAKE 1 TABLET BY MOUTH EVERY DAY 90 tablet 0  . metoprolol (TOPROL-XL) 200 MG 24 hr tablet TAKE 1/2 TABLET BY MOUTH EVERY DAY 45 tablet 3  . Multiple Vitamins-Minerals (MULTIVITAMIN PO) Take 1 tablet by mouth daily.    . Multiple Vitamins-Minerals (PRESERVISION AREDS 2) CAPS 2 po qd (Patient taking differently: Take 2 capsules by mouth daily. )    . simvastatin (ZOCOR) 20 MG tablet TAKE 1 TABLET BY MOUTH EVERYDAY AT BEDTIME 90 tablet 1  . vitamin C (ASCORBIC ACID) 500 MG tablet Take 500 mg by mouth daily.    . hydrochlorothiazide (MICROZIDE) 12.5 MG capsule TAKE 1 CAPSULE (12.5 MG TOTAL) BY MOUTH AS NEEDED (SWELLING). 90 capsule 1   No current facility-administered medications on file prior to visit.     Objective:  Objective  Physical Exam Vitals and nursing note reviewed.  Constitutional:      General: He is sleeping.     Appearance: He is well-developed.  HENT:     Head: Normocephalic and atraumatic.  Eyes:     Pupils: Pupils are equal, round, and reactive to light.  Neck:     Thyroid: No thyromegaly.  Cardiovascular:     Rate and Rhythm: Normal rate and regular rhythm.  Heart sounds: Murmur present.     Comments: +1/6 murmur Pulmonary:     Effort: Pulmonary effort is normal. No respiratory distress.     Breath sounds: Normal breath sounds. No wheezing or rales.  Chest:     Chest wall: No tenderness.  Musculoskeletal:        General: No tenderness.     Cervical back: Normal range of motion and neck supple.  Skin:    General: Skin is warm and dry.  Neurological:     Mental Status: He is oriented to person, place, and time.  Psychiatric:        Behavior: Behavior normal.        Thought Content: Thought content normal.        Judgment: Judgment normal.    BP 140/80 (BP Location: Left Arm, Patient Position: Sitting, Cuff Size: Normal)   Pulse 60   Temp (!) 97 F (36.1 C) (Temporal)   Resp 18   Ht 5'  8" (1.727 m)   Wt 160 lb 6.4 oz (72.8 kg)   SpO2 98%   BMI 24.39 kg/m  Wt Readings from Last 3 Encounters:  01/19/20 160 lb 7.9 oz (72.8 kg)  01/19/20 160 lb 6.4 oz (72.8 kg)  08/21/19 156 lb 6.4 oz (70.9 kg)     Lab Results  Component Value Date   WBC 8.6 02/27/2018   HGB 14.1 02/27/2018   HCT 40.9 02/27/2018   PLT 229 02/27/2018   GLUCOSE 98 07/17/2019   CHOL 99 07/17/2019   TRIG 37.0 07/17/2019   HDL 49.80 07/17/2019   LDLCALC 42 07/17/2019   ALT 23 07/17/2019   AST 39 (H) 07/17/2019   NA 139 07/17/2019   K 4.2 07/17/2019   CL 104 07/17/2019   CREATININE 1.05 07/17/2019   BUN 25 (H) 07/17/2019   CO2 28 07/17/2019   TSH 1.830 01/25/2018   PSA 0.35 12/27/2009   INR 1.2 02/27/2018   HGBA1C 5.0 05/21/2012    No results found.   Assessment & Plan:  Plan  I am having Mark Stark. maintain his b complex vitamins, Calcium Carbonate-Vitamin D, fish oil-omega-3 fatty acids, cromolyn, PreserVision AREDS 2, vitamin C, Multiple Vitamins-Minerals (MULTIVITAMIN PO), doxazosin, hydrochlorothiazide, clorazepate, metoprolol, apixaban, simvastatin, fenofibrate, and lisinopril.  No orders of the defined types were placed in this encounter.   Problem List Items Addressed This Visit      Unprioritized   Anxiety state    Stable  con't meds       Essential hypertension - Primary    Well controlled, no changes to meds. Encouraged heart healthy diet such as the DASH diet and exercise as tolerated.       Relevant Orders   Lipid panel   Comprehensive metabolic panel   Hyperlipidemia   Relevant Orders   Lipid panel   Comprehensive metabolic panel   Macular degeneration    Per opth      Paroxysmal atrial fibrillation (HCC)    Stable-- controlled Per cardio          Follow-up: Return in about 6 months (around 07/18/2020), or if symptoms worsen or fail to improve, for hypertension, hyperlipidemia.  [name], DO

## 2020-01-22 ENCOUNTER — Other Ambulatory Visit (INDEPENDENT_AMBULATORY_CARE_PROVIDER_SITE_OTHER): Payer: Medicare Other

## 2020-01-22 ENCOUNTER — Other Ambulatory Visit: Payer: Self-pay

## 2020-01-22 DIAGNOSIS — E785 Hyperlipidemia, unspecified: Secondary | ICD-10-CM

## 2020-01-22 DIAGNOSIS — E875 Hyperkalemia: Secondary | ICD-10-CM

## 2020-01-23 LAB — LIPID PANEL
Cholesterol: 101 mg/dL (ref 0–200)
HDL: 47.8 mg/dL (ref >39.00)
LDL Cholesterol: 42 mg/dL (ref 0–99)
NonHDL: 53.5
Total CHOL/HDL Ratio: 2
Triglycerides: 58 mg/dL (ref 0.0–149.0)
VLDL: 11.6 mg/dL (ref 0.0–40.0)

## 2020-01-23 LAB — COMPREHENSIVE METABOLIC PANEL
ALT: 19 U/L (ref 0–53)
AST: 29 U/L (ref 0–37)
Albumin: 4.1 g/dL (ref 3.5–5.2)
Alkaline Phosphatase: 30 U/L — ABNORMAL LOW (ref 39–117)
BUN: 24 mg/dL — ABNORMAL HIGH (ref 6–23)
CO2: 30 mEq/L (ref 19–32)
Calcium: 8.9 mg/dL (ref 8.4–10.5)
Chloride: 100 mEq/L (ref 96–112)
Creatinine, Ser: 1.15 mg/dL (ref 0.40–1.50)
GFR: 59.8 mL/min — ABNORMAL LOW (ref >60.00)
Glucose, Bld: 85 mg/dL (ref 70–99)
Potassium: 4.1 mEq/L (ref 3.5–5.1)
Sodium: 136 mEq/L (ref 135–145)
Total Bilirubin: 0.8 mg/dL (ref 0.2–1.2)
Total Protein: 6.2 g/dL (ref 6.0–8.3)

## 2020-01-29 DIAGNOSIS — H04129 Dry eye syndrome of unspecified lacrimal gland: Secondary | ICD-10-CM | POA: Diagnosis not present

## 2020-01-29 DIAGNOSIS — H35351 Cystoid macular degeneration, right eye: Secondary | ICD-10-CM | POA: Diagnosis not present

## 2020-01-29 DIAGNOSIS — H35373 Puckering of macula, bilateral: Secondary | ICD-10-CM | POA: Diagnosis not present

## 2020-01-29 DIAGNOSIS — H43813 Vitreous degeneration, bilateral: Secondary | ICD-10-CM | POA: Diagnosis not present

## 2020-01-29 DIAGNOSIS — H353211 Exudative age-related macular degeneration, right eye, with active choroidal neovascularization: Secondary | ICD-10-CM | POA: Diagnosis not present

## 2020-01-29 DIAGNOSIS — Z961 Presence of intraocular lens: Secondary | ICD-10-CM | POA: Diagnosis not present

## 2020-01-29 DIAGNOSIS — H353122 Nonexudative age-related macular degeneration, left eye, intermediate dry stage: Secondary | ICD-10-CM | POA: Diagnosis not present

## 2020-02-09 ENCOUNTER — Ambulatory Visit: Payer: Medicare Other | Attending: Internal Medicine

## 2020-02-09 DIAGNOSIS — Z23 Encounter for immunization: Secondary | ICD-10-CM | POA: Insufficient documentation

## 2020-02-09 NOTE — Progress Notes (Signed)
   [id number]  Name:  Mark Stark.    MRN: [id number] DOB: 01-16-1928  02/09/2020  [name] was observed post Covid-19 immunization for 15 minutes without incidence. He was provided with Vaccine Information Sheet and instruction to access the V-Safe system.   [name] was instructed to call 911 with any severe reactions post vaccine: [name] Difficulty breathing  . Swelling of your face and throat  . A fast heartbeat  . A bad rash all over your body  . Dizziness and weakness    Immunizations Administered    Name Date Dose VIS Date Route   Pfizer COVID-19 Vaccine 02/09/2020 12:55 PM 0.3 mL 12/05/2019 Intramuscular   Manufacturer: [place]   Lot: [id number]   [place]: [id number]

## 2020-02-20 ENCOUNTER — Other Ambulatory Visit: Payer: Self-pay

## 2020-02-20 ENCOUNTER — Encounter: Payer: Self-pay | Admitting: Cardiovascular Disease

## 2020-02-20 ENCOUNTER — Ambulatory Visit (INDEPENDENT_AMBULATORY_CARE_PROVIDER_SITE_OTHER): Payer: Medicare Other | Admitting: Cardiovascular Disease

## 2020-02-20 VITALS — BP 139/70 | HR 55 | Temp 97.1°F | Ht 68.0 in | Wt 161.8 lb

## 2020-02-20 DIAGNOSIS — M25471 Effusion, right ankle: Secondary | ICD-10-CM | POA: Diagnosis not present

## 2020-02-20 DIAGNOSIS — M25474 Effusion, right foot: Secondary | ICD-10-CM

## 2020-02-20 DIAGNOSIS — M25475 Effusion, left foot: Secondary | ICD-10-CM

## 2020-02-20 DIAGNOSIS — I480 Paroxysmal atrial fibrillation: Secondary | ICD-10-CM

## 2020-02-20 DIAGNOSIS — E782 Mixed hyperlipidemia: Secondary | ICD-10-CM

## 2020-02-20 DIAGNOSIS — I440 Atrioventricular block, first degree: Secondary | ICD-10-CM

## 2020-02-20 DIAGNOSIS — Z7901 Long term (current) use of anticoagulants: Secondary | ICD-10-CM

## 2020-02-20 DIAGNOSIS — I10 Essential (primary) hypertension: Secondary | ICD-10-CM

## 2020-02-20 DIAGNOSIS — M25472 Effusion, left ankle: Secondary | ICD-10-CM

## 2020-02-20 NOTE — Patient Instructions (Signed)

## 2020-02-20 NOTE — Progress Notes (Signed)
Cardiology Office Note    Date:  02/20/2020   ID:  Mark Stark., DOB 1928-01-24, MRN [id number]  PCP:  [name], DO  Cardiologist:  Mark Majestic, MD   F/U cardiology evaluation, initally referred through the courtesy of Dr. Roma Stark for evaluation of an irregular rhythm/atrial fibrillation.  History of Present Illness:  Mark Stark. is a 86 y.o. male who was referred by Dr. [name] for atrial fibrillation.  I saw him as an add-on on 01/24/2018.  He has been seen on several occasions since and was last seen in August 2020.  He presents for a follow-up evaluation.  Mark Stark is a young appearing 86 year old male who remotely had seen Dr. Percival Spanish on one occasion in 2012.  He was noted have a mild slight heart murmur.  Over the last several years he has continued to be active.  He typically walks for 1 hour every morning.  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  On Christmas Eve 2018, he developed a sensation of an irregular rhythm.  His heart rhythm has persisted.  He had seen Dr. Roma Stark and was found to be in atrial fibrillation.  He was started on eliquis 5 mg twice a day for anticoagulation.  He has been taking metoprolol succinate 100 mg daily, lisinopril 5 mg, and doxazocin 4 mg for hypertension.  He is on simvastatin 20 mg daily for hyperlipidemia.  When I saw him, he had just started to wear a Holter monitor.   His initial strips confirmed persistent atrial fibrillation.  A [facility] service read out from 01/24/2018 at 450:54 AM has revealed a 3.1 second pause while the patient was sleeping. [name]  He denied any chest pain, pesyncope, PND, orthopnea.  He had experience mild fatigue and slight shortness of breath.  He denied issues with sleep.    He was started on eliquis for anticoagulation  on January 06, 2018.  He underwent a 2-D echo Doppler study on 01/21/2018 which showed an EF of 55-60%.  Ascending aorta was upper  normal at 40 mm.  He had moderate mitral regurgitation.  His left atrium measured 55 mm was interpreted as severe dilation.  LA volume was 56 mL's per meter squared. When I last saw himon 02/22/2018, he was maintaining atrial fibrillation and had been on anticoagulatio for 6 weeks.  He underwent successful DC cardioversion on 03/01/2018 and was successfully converted to sinus rhythm with 120 J synchronized countershock.  I saw him in follow-up on March 15, 2018, he was maintaining sinus rhythm and was without recurrent awareness of arrhythmia.  His ECG showed sinus bradycardia at 54 bpm with first-degree AV block.    When I  saw him in June 2019 he was remaining stable and was unaware of any recurrent atrial fibrillation.  He continues to be active working in a large garden and also pushing a Therapist, art.  Over the past several months, he has been on a regimen consisting of lisinopril 15 mg, metoprolol 100 mg daily, doxazosin 4 mg at bedtime.  He has been on fenofibrate 160 mg and simvastatin 20 mg for hyperlipidemia.  He has continued to be on Eliquis 5 mg twice a day.  He had sent me a letter last month which stated that his blood pressures at home were ranging between 116 and 148 with a median of 133.  His pulse was 59 and diastolic pressures were in  the 60s to 39s.  He continues to be doing well and denies recurrent palpitations or awareness of atrial fibrillation.    I saw him in October 2019.  During that evaluation I further titrated his lisinopril to 20 mg daily.  Over the past several months he has now been maintained on lisinopril 20 mg daily, Toprol-XL 100 mg, and Cardura 4 mg at bedtime.  He is very compulsive with his blood pressure readings.  Over this 27-monthperiod he had taken 79 blood pressure readings over 51 days mean blood pressure is 139/70 with a heart rate of 58 with his range systolic 1 291-558 and diastolic 604-13with heart rates between 50 and 70.  He was concerned that his blood  pressure is still elevated.   I saw him in February 2020.  He has been on lisinopril 20 mg daily, Toprol-XL 100 mg, and doxazosin 4 mg at bedtime for hypertension.  Since his last office visit he had 222 blood pressure readings from February 26 through August 18, 2019.  His mean blood pressure of the 222 readings is 1643/83with a systolic range of 1779to 1396and diastolic range of 56 to 86.  His heart rate ranges 52 to 87.  At times he has begun to notice ankle swelling right greater than left for the past 6 weeks.  He had undergone follow-up laboratory with his primary physician on July 17, 2019.  Creatinine was 1.05.  Potassium was 4.2.  He feels well.  He remains active.  He works in the yard and garden.  He does aerobic activity.  He denies chest tightness or pressure.    Since his last evaluation in August 2020, Mark Stark remained stable.  He has continued to take his blood pressure regularly and typically is taken this in the early morning.  He brought with him data regarding 48 blood pressure readings from December 27, 2019 through February 18, 2020 on lisinopril 20 mg, Cardura 4 mg, and Toprol-XL 100 mg.  His average daily blood pressure in the morning before taking his medication was 139 with a range of 1 288-661 with diastolic blood pressures averaging 68.2 with a range of 61-75.  His heart rate averages 55.9 with a range of 51-63.  Presently he feels well.  He denies chest pain.  He denies any awareness of recurrent arrhythmia.  At times he admits to trivial ankle edema.  He has received both Covid vaccination.  He presents for evaluation.  Past Medical History:  Diagnosis Date  . Anxiety   . Diverticulitis   . Dyslipidemia   . Hypertension   . Inferior mesenteric vein thrombosis ([facility]   . Prostate cancer ([facility]     Past Surgical History:  Procedure Laterality Date  . CARDIOVERSION N/A 03/01/2018   Procedure: CARDIOVERSION;  Surgeon: KTroy Sine MD;  [facility];  Service:  Cardiovascular;  Laterality: N/A;  . CATARACT EXTRACTION  01/27/2010   right  . CATARACT EXTRACTION  03/04/2010   left  . Prostate seed implants    . PROSTATE SURGERY      Current Medications: Outpatient Medications Prior to Visit  Medication Sig Dispense Refill  . apixaban (ELIQUIS) 5 MG TABS tablet Take 1 tablet (5 mg total) by mouth 2 (two) times daily. 180 tablet 1  . b complex vitamins tablet Take 1 tablet by mouth daily.      . Calcium Carbonate-Vitamin D (CALTRATE 600+D) 600-400 MG-UNIT per tablet Take 1  tablet by mouth daily.      . clorazepate (TRANXENE) 15 MG tablet TAKE 1/2 TABLET BY MOUTH 2 TIMES DAILY 30 tablet 2  . cromolyn (NASALCROM) 5.2 MG/ACT nasal spray 2 sprays by Nasal route 2 (two) times daily.      [name] doxazosin (CARDURA) 4 MG tablet TAKE 1 TABLET BY MOUTH EVERYDAY AT BEDTIME 90 tablet 1  . fenofibrate 160 MG tablet TAKE 1 TABLET BY MOUTH EVERY DAY 90 tablet 1  . fish oil-omega-3 fatty acids 1000 MG capsule Take 4 g by mouth daily.     . hydrochlorothiazide (MICROZIDE) 12.5 MG capsule TAKE 1 CAPSULE (12.5 MG TOTAL) BY MOUTH AS NEEDED (SWELLING). 90 capsule 1  . lisinopril (ZESTRIL) 20 MG tablet TAKE 1 TABLET BY MOUTH EVERY DAY 90 tablet 0  . metoprolol (TOPROL-XL) 200 MG 24 hr tablet TAKE 1/2 TABLET BY MOUTH EVERY DAY 45 tablet 3  . Multiple Vitamins-Minerals (MULTIVITAMIN PO) Take 1 tablet by mouth daily.    . Multiple Vitamins-Minerals (PRESERVISION AREDS 2) CAPS 2 po qd (Patient taking differently: Take 2 capsules by mouth daily. )    . simvastatin (ZOCOR) 20 MG tablet TAKE 1 TABLET BY MOUTH EVERYDAY AT BEDTIME 90 tablet 1  . vitamin C (ASCORBIC ACID) 500 MG tablet Take 500 mg by mouth daily.     No facility-administered medications prior to visit.     Allergies:   Sulfonamide derivatives   Social History   Socioeconomic History  . Marital status: Married    Spouse name: Not on file  . Number of children: Not on file  . Years of education: Not on file   . Highest education level: Not on file  Occupational History  . Occupation: Professor---RETIRED    Employer: RETIRED    Comment: Retired  Tobacco Use  . Smoking status: Never Smoker  . Smokeless tobacco: Never Used  Substance and Sexual Activity  . Alcohol use: No    Comment: Little wine occasionally   . Drug use: No  . Sexual activity: Never    Partners: Female  Other Topics Concern  . Not on file  Social History Narrative  . Not on file   Social Determinants of Health   Financial Resource Strain: Low Risk   . Difficulty of Paying Living Expenses: Not hard at all  Food Insecurity:   . Worried About Charity fundraiser in the Last Year: Not on file  . Ran Out of Food in the Last Year: Not on file  Transportation Needs:   . Lack of Transportation (Medical): Not on file  . Lack of Transportation (Non-Medical): Not on file  Physical Activity:   . Days of Exercise per Week: Not on file  . Minutes of Exercise per Session: Not on file  Stress:   . Feeling of Stress : Not on file  Social Connections:   . Frequency of Communication with Friends and Family: Not on file  . Frequency of Social Gatherings with Friends and Family: Not on file  . Attends Religious Services: Not on file  . Active Member of Clubs or Organizations: Not on file  . Attends Archivist Meetings: Not on file  . Marital Status: Not on file     Family History:  The patient's family history includes Alzheimer's disease in his mother; Cancer (age of onset: 5) in his brother; Dementia in his mother; Mental illness in his mother.  His father is deceased and had suffered a myocardial infarction and  had 3 CVAs.  His mother died and had Alzheimer's disease.  ROS General: Negative; No fevers, chills, or night sweats;  HEENT: Negative; No changes in vision or hearing, sinus congestion, difficulty swallowing Pulmonary: Negative; No cough, wheezing, shortness of breath, hemoptysis Cardiovascular: See  history of present illness GI: Negative; No nausea, vomiting, diarrhea, or abdominal pain GU: Negative; No dysuria, hematuria, or difficulty voiding Musculoskeletal: Negative; no myalgias, joint pain, or weakness Hematologic/Oncology: Negative; no easy bruising, bleeding Endocrine: Negative; no heat/cold intolerance; no diabetes Neuro: Negative; no changes in balance, headaches Skin: Negative; No rashes or skin lesions Psychiatric: Negative; No behavioral problems, depression Sleep: Negative; No snoring, daytime sleepiness, hypersomnolence, bruxism, restless legs, hypnogognic hallucinations, no cataplexy Other comprehensive 14 point system review is negative.   PHYSICAL EXAM:   VS:  BP 139/70   Pulse (!) 55   Temp (!) 97.1 F (36.2 C)   Ht 5' 8"  (1.727 m)   Wt 161 lb 12.8 oz (73.4 kg)   SpO2 98%   BMI 24.60 kg/m     Repeat blood pressure by me was 116/70 supine and 118/70 standing  Wt Readings from Last 3 Encounters:  02/20/20 161 lb 12.8 oz (73.4 kg)  01/19/20 160 lb 7.9 oz (72.8 kg)  01/19/20 160 lb 6.4 oz (72.8 kg)    General: Alert, oriented, no distress.  Skin: normal turgor, no rashes, warm and dry HEENT: Normocephalic, atraumatic. Pupils equal round and reactive to light; sclera anicteric; extraocular muscles intact; Nose without nasal septal hypertrophy Mouth/Parynx benign; Mallinpatti scale 3 Neck: No JVD, no carotid bruits; normal carotid upstroke Lungs: clear to ausculatation and percussion; no wheezing or rales Chest wall: without tenderness to palpitation Heart: PMI not displaced, RRR, s1 s2 normal, 1/6 systolic murmur, no diastolic murmur, no rubs, gallops, thrills, or heaves Abdomen: soft, nontender; no hepatosplenomehaly, BS+; abdominal aorta nontender and not dilated by palpation. Back: no CVA tenderness Pulses 2+ Musculoskeletal: full range of motion, normal strength, no joint deformities Extremities: Trivial ankle edema; no clubbing cyanosis, Homan's  sign negative  Neurologic: grossly nonfocal; Cranial nerves grossly wnl Psychologic: Normal mood and affect   Studies/Labs Reviewed:   ECG (independently read by me): Sinus bradycardia 55 bpm, first-degree AV block PR interval 250 ms.  No ectopy  August 2020 ECG (independently read by me): Sinus bradycardia 57 bpm.  First-degree AV block with PR 242 ms.  QTc interval 395 ms.  No ST T changes.  Early transition.  February 18, 2019 ECG (independently read by me):Sinus bradycardia at 56 bpm.  First-degree AV block.  Early transition..  PR 238 ms.  October 21, 2018 ECG (independently read by me): Sinus bradycardia 56 bpm; first-degree AV block with a PR interval of 236 ms.  No ectopy.  No ST segment changes.  March 15, 2018 ECG (independently read by me): sinus bradycardia 54 bpm.  First degree AV block with PR interval at 246 bili seconds.  No ectopy.  Normal QTc interval.  02/22/2018 EKG:  EKG is ordered today.  Atrial fibrillation at 81 bpm.  QTc interval 397 ms.  01/24/2018 ECG (independently read by me): atrial fibrillation at 73 bpm. QTc interval 387 ms.  No significant ST-T changes.  Recent Labs: BMP Latest Ref Rng & Units 01/22/2020 01/19/2020 07/17/2019  Glucose 70 - 99 mg/dL 85 94 98  BUN 6 - 23 mg/dL 24(H) 20 25(H)  Creatinine 0.40 - 1.50 mg/dL 1.15 1.13 1.05  BUN/Creat Ratio 6 - 22 (calc) - - -  Sodium 135 - 145 mEq/L 136 137 139  Potassium 3.5 - 5.1 mEq/L 4.1 5.5 No hemolysis seen(H) 4.2  Chloride 96 - 112 mEq/L 100 104 104  CO2 19 - 32 mEq/L 30 29 28   Calcium 8.4 - 10.5 mg/dL 8.9 9.7 9.2     Hepatic Function Latest Ref Rng & Units 01/22/2020 01/19/2020 07/17/2019  Total Protein 6.0 - 8.3 g/dL 6.2 6.2 6.0  Albumin 3.5 - 5.2 g/dL 4.1 4.2 4.1  AST 0 - 37 U/L 29 29 39(H)  ALT 0 - 53 U/L 19 21 23   Alk Phosphatase 39 - 117 U/L 30(L) 39 34(L)  Total Bilirubin 0.2 - 1.2 mg/dL 0.8 0.8 0.8  Bilirubin, Direct 0.0 - 0.3 mg/dL - - -    CBC Latest Ref Rng & Units 02/27/2018 01/25/2018  12/22/2016  WBC 3.4 - 10.8 x10E3/uL 8.6 4.4 7.7  Hemoglobin 13.0 - 17.7 g/dL 14.1 15.8 14.1  Hematocrit 37.5 - 51.0 % 40.9 45.7 41.2  Platelets 150 - 379 x10E3/uL 229 166 208.0   Lab Results  Component Value Date   MCV 94 02/27/2018   MCV 95 01/25/2018   MCV 95.1 12/22/2016   Lab Results  Component Value Date   TSH 1.830 01/25/2018   Lab Results  Component Value Date   HGBA1C 5.0 05/21/2012     BNP No results found for: BNP  ProBNP No results found for: PROBNP   Lipid Panel     Component Value Date/Time   CHOL 101 01/22/2020 1455   TRIG 58.0 01/22/2020 1455   HDL 47.80 01/22/2020 1455   CHOLHDL 2 01/22/2020 1455   VLDL 11.6 01/22/2020 1455   LDLCALC 42 01/22/2020 1455   LDLCALC 32 01/11/2018 1454     RADIOLOGY: No results found.   Additional studies/ records that were reviewed today include:  I reviewed the prior evaluation in 2012.  I reviewed the primary care evaluation from [facility]. I reviewed the Holter monitor. ------------------------------------------------------------------- 01/21/2018  Echo Study Conclusions  - Left ventricle: The cavity size was normal. There was moderate   concentric hypertrophy. Systolic function was normal. The   estimated ejection fraction was in the range of 55% to 60%. Wall   motion was normal; there were no regional wall motion   abnormalities. The study was not technically sufficient to allow   evaluation of LV diastolic dysfunction due to atrial   fibrillation. - Aortic valve: Valve mobility was restricted. There was moderate   regurgitation. - Ascending aorta: The ascending aorta was upper normal size   measuring 40 mm. - Mitral valve: There was moderate regurgitation. - Left atrium: The atrium was severely dilated. - Right ventricle: The cavity size was normal. Wall thickness was   normal. Systolic function was normal. - Right atrium: The atrium was mildly dilated. - Tricuspid valve: There was moderate  regurgitation. - Pulmonic valve: There was trivial regurgitation. - Pulmonary arteries: Systolic pressure was mildly increased. PA   peak pressure: 38 mm Hg (S). - Inferior vena cava: The vessel was normal in size. - Pericardium, extracardiac: There was no pericardial effusion.   ASSESSMENT:    No diagnosis found.  PLAN:  Mark Stark is a young appearing, active 86 year old gentleman who has a history of hypertension, hyperlipidemia, and developed new onset irregular heart rhythm on Christmas Eve 2018.  He was started on eliquis anticoagulation after seeing Dr. [name] and has been on therapy since 01/06/2018.  An  echo Doppler study demonstrated  preserved LV function.  However, he had a dilated left atrium.  In 2012, his left atrial dimension was increased at 49 mm but this had further increased on his most recent echo in January 2019 at 55 mm. He underwent successful outpatient DC cardioversion and has been maintaining sinus rhythm since his procedure on 03/01/2018.  Fortunately, since his cardioversion he is maintaining sinus rhythm.  His ECG today confirms sinus bradycardia at 57 bpm with first-degree AV block with a PR interval at 242 ms.  He has been fairly compulsive and monitoring his blood pressure at home and no basis.  I reviewed his most recent monitoring over the past 2 months.  Typically he has been taking blood pressure in the morning on all occasions.  Interestingly checked his blood pressure in the office today which is several hours after he had taken his medications, blood pressure is excellent with supine blood pressure 116/70 without orthostatic change at 118/70 in the standing position.  He continues to be on lisinopril 20 mg, Toprol-XL 100 mg and doxazosin 4 mg at bedtime.  He has a prescription for HCTZ but has not routinely been using this.  He does have trace ankle edema on exam today.  I have suggested support stockings.  If edema continues he can take the HCTZ 12.5 mg as  needed.  He continues to be on simvastatin 20 mg and fenofibrate for hyperlipidemia.  Recent lipid studies are excellent with total cholesterol 101, triglycerides 58, HDL 47, and LDL 42 on January 22, 2020.  He is tolerating anticoagulation with Eliquis 5 mg twice a day without bleeding.  He denies any chest pain PND orthopnea or palpitations.  As long as he remains stable, I will see him in 1 year for evaluation or sooner as needed.  Case three 1 second here which is signout of this note    Medication Adjustments/Labs and Tests Ordered: Current medicines are reviewed at length with the patient today.  Concerns regarding medicines are outlined above.  Medication changes, Labs and Tests ordered today are listed in the Patient Instructions below. There are no Patient Instructions on file for this visit.   Signed, Mark Majestic, MD  02/20/2020 11:16 AM    [facility] [address], [place], [place], [place]  [address] Phone: [phone]

## 2020-02-21 ENCOUNTER — Encounter: Payer: Self-pay | Admitting: Cardiovascular Disease

## 2020-02-21 ENCOUNTER — Other Ambulatory Visit: Payer: Self-pay | Admitting: Family Medicine

## 2020-03-20 ENCOUNTER — Other Ambulatory Visit: Payer: Self-pay | Admitting: Family Medicine

## 2020-03-20 DIAGNOSIS — F411 Generalized anxiety disorder: Secondary | ICD-10-CM

## 2020-03-22 NOTE — Telephone Encounter (Signed)
Last RX:  10/27/19, # 30 x 2 RF. Last OV:  01/19/20 Next OV:  07/19/20 UDS:  07/12/18, past due CSC:  07/12/18, past due

## 2020-03-25 DIAGNOSIS — Z961 Presence of intraocular lens: Secondary | ICD-10-CM | POA: Diagnosis not present

## 2020-03-25 DIAGNOSIS — H35373 Puckering of macula, bilateral: Secondary | ICD-10-CM | POA: Diagnosis not present

## 2020-03-25 DIAGNOSIS — H353122 Nonexudative age-related macular degeneration, left eye, intermediate dry stage: Secondary | ICD-10-CM | POA: Diagnosis not present

## 2020-03-25 DIAGNOSIS — H353211 Exudative age-related macular degeneration, right eye, with active choroidal neovascularization: Secondary | ICD-10-CM | POA: Diagnosis not present

## 2020-03-25 DIAGNOSIS — H43813 Vitreous degeneration, bilateral: Secondary | ICD-10-CM | POA: Diagnosis not present

## 2020-04-08 DIAGNOSIS — R351 Nocturia: Secondary | ICD-10-CM | POA: Diagnosis not present

## 2020-04-08 DIAGNOSIS — C61 Malignant neoplasm of prostate: Secondary | ICD-10-CM | POA: Diagnosis not present

## 2020-05-05 ENCOUNTER — Other Ambulatory Visit: Payer: Self-pay | Admitting: Cardiovascular Disease

## 2020-05-05 ENCOUNTER — Other Ambulatory Visit: Payer: Self-pay | Admitting: Family Medicine

## 2020-05-05 DIAGNOSIS — I480 Paroxysmal atrial fibrillation: Secondary | ICD-10-CM

## 2020-05-20 ENCOUNTER — Telehealth: Payer: Self-pay | Admitting: Urology

## 2020-05-20 NOTE — Telephone Encounter (Signed)
Pt called and asked who you would recommend for him to see at [facility]? He states he cant drive to [place] but wanted your opinion.

## 2020-05-22 NOTE — Telephone Encounter (Signed)
Dr. Milford Cage

## 2020-05-27 DIAGNOSIS — H04123 Dry eye syndrome of bilateral lacrimal glands: Secondary | ICD-10-CM | POA: Insufficient documentation

## 2020-05-29 ENCOUNTER — Other Ambulatory Visit: Payer: Self-pay | Admitting: Family Medicine

## 2020-07-02 ENCOUNTER — Other Ambulatory Visit: Payer: Self-pay | Admitting: Family Medicine

## 2020-07-02 DIAGNOSIS — F411 Generalized anxiety disorder: Secondary | ICD-10-CM

## 2020-07-02 NOTE — Telephone Encounter (Signed)
Requesting: Clorazepate Contract: 07/12/2018 UDS: 07/12/2018, low risk Last OV: 01/19/2020 Next OV: 07/19/2020 Last Refill: 03/22/2020, #300- 2 RF Database:   Please advise

## 2020-07-08 DIAGNOSIS — H04123 Dry eye syndrome of bilateral lacrimal glands: Secondary | ICD-10-CM | POA: Diagnosis not present

## 2020-07-08 DIAGNOSIS — H35373 Puckering of macula, bilateral: Secondary | ICD-10-CM | POA: Diagnosis not present

## 2020-07-08 DIAGNOSIS — H353231 Exudative age-related macular degeneration, bilateral, with active choroidal neovascularization: Secondary | ICD-10-CM | POA: Diagnosis not present

## 2020-07-08 DIAGNOSIS — H43813 Vitreous degeneration, bilateral: Secondary | ICD-10-CM | POA: Diagnosis not present

## 2020-07-08 DIAGNOSIS — Z961 Presence of intraocular lens: Secondary | ICD-10-CM | POA: Diagnosis not present

## 2020-07-19 ENCOUNTER — Ambulatory Visit: Payer: Medicare Other | Admitting: Family Medicine

## 2020-07-26 ENCOUNTER — Other Ambulatory Visit: Payer: Self-pay

## 2020-07-26 ENCOUNTER — Encounter: Payer: Self-pay | Admitting: Family Medicine

## 2020-07-26 ENCOUNTER — Ambulatory Visit (INDEPENDENT_AMBULATORY_CARE_PROVIDER_SITE_OTHER): Payer: Medicare Other | Admitting: Family Medicine

## 2020-07-26 VITALS — BP 142/86 | HR 60 | Temp 98.1°F | Resp 18 | Ht 68.0 in | Wt 162.8 lb

## 2020-07-26 DIAGNOSIS — I351 Nonrheumatic aortic (valve) insufficiency: Secondary | ICD-10-CM | POA: Diagnosis not present

## 2020-07-26 DIAGNOSIS — R351 Nocturia: Secondary | ICD-10-CM | POA: Diagnosis not present

## 2020-07-26 DIAGNOSIS — Z8546 Personal history of malignant neoplasm of prostate: Secondary | ICD-10-CM

## 2020-07-26 DIAGNOSIS — F411 Generalized anxiety disorder: Secondary | ICD-10-CM

## 2020-07-26 DIAGNOSIS — I10 Essential (primary) hypertension: Secondary | ICD-10-CM

## 2020-07-26 DIAGNOSIS — E785 Hyperlipidemia, unspecified: Secondary | ICD-10-CM | POA: Diagnosis not present

## 2020-07-26 DIAGNOSIS — Z87438 Personal history of other diseases of male genital organs: Secondary | ICD-10-CM

## 2020-07-26 MED ORDER — DOXAZOSIN MESYLATE 4 MG PO TABS: ORAL_TABLET | ORAL | 1 refills | Status: DC

## 2020-07-26 MED ORDER — METOPROLOL SUCCINATE ER 200 MG PO TB24: 100.0000 mg | ORAL_TABLET | Freq: Every day | ORAL | 3 refills | Status: DC

## 2020-07-26 MED ORDER — CLORAZEPATE DIPOTASSIUM 15 MG PO TABS: ORAL_TABLET | ORAL | 1 refills | Status: DC

## 2020-07-26 NOTE — Patient Instructions (Signed)

## 2020-07-26 NOTE — Assessment & Plan Note (Signed)
Stable con't meds 

## 2020-07-26 NOTE — Assessment & Plan Note (Signed)
Well controlled, no changes to meds. Encouraged heart healthy diet such as the DASH diet and exercise as tolerated.  °

## 2020-07-26 NOTE — Assessment & Plan Note (Signed)
Per cardiology 

## 2020-07-26 NOTE — Assessment & Plan Note (Signed)
Tolerating statin, encouraged heart healthy diet, avoid trans fats, minimize simple carbs and saturated fats. Increase exercise as tolerated 

## 2020-07-26 NOTE — Progress Notes (Signed)
Patient ID: Mark Stark., male    DOB: 12-Aug-1928  Age: 86 y.o. MRN: [id number]    Subjective:  Subjective  HPI Mark Stark. presents for f/u bp and cholesterol   Pt has no complaints  Pt has received the covid vaccine   Review of Systems  Constitutional: Negative for appetite change, diaphoresis, fatigue and unexpected weight change.  Eyes: Negative for pain, redness and visual disturbance.  Respiratory: Negative for cough, chest tightness, shortness of breath and wheezing.   Cardiovascular: Negative for chest pain, palpitations and leg swelling.  Endocrine: Negative for cold intolerance, heat intolerance, polydipsia, polyphagia and polyuria.  Genitourinary: Negative for difficulty urinating, dysuria and frequency.  Neurological: Negative for dizziness, light-headedness, numbness and headaches.    History Past Medical History:  Diagnosis Date  . Anxiety   . Diverticulitis   . Dyslipidemia   . Hypertension   . 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 Prostate cancer [facility])     He has a past surgical history that includes Cataract extraction (01/27/2010); Cataract extraction (03/04/2010); Prostate seed implants; Prostate surgery; and Cardioversion (N/A, 03/01/2018).   His family history includes Alzheimer's disease in his mother; Cancer (age of onset: 34) in his brother; Dementia in his mother; Mental illness in his mother.He reports that he has never smoked. He has never used smokeless tobacco. He reports that he does not drink alcohol and does not use drugs.  Current Outpatient Medications on File Prior to Visit  Medication Sig Dispense Refill  . b complex vitamins tablet Take 1 tablet by mouth daily.      . Calcium Carbonate-Vitamin D (CALTRATE 600+D) 600-400 MG-UNIT per tablet Take 1 tablet by mouth daily.      . cromolyn (NASALCROM) 5.2 MG/ACT nasal spray 2 sprays by Nasal route 2 (two) times daily.      [name] ELIQUIS 5 MG TABS tablet TAKE 1 TABLET BY MOUTH TWICE A DAY  180 tablet 1  . fenofibrate 160 MG tablet TAKE 1 TABLET BY MOUTH EVERY DAY 90 tablet 1  . fish oil-omega-3 fatty acids 1000 MG capsule Take 4 g by mouth daily.     [name] lisinopril (ZESTRIL) 20 MG tablet Take 1 tablet (20 mg total) by mouth daily. 90 tablet 1  . Multiple Vitamins-Minerals (MULTIVITAMIN PO) Take 1 tablet by mouth daily.    . Multiple Vitamins-Minerals (PRESERVISION AREDS 2) CAPS 2 po qd (Patient taking differently: Take 2 capsules by mouth daily. )    . simvastatin (ZOCOR) 20 MG tablet TAKE 1 TABLET BY MOUTH EVERYDAY AT BEDTIME 90 tablet 1  . vitamin C (ASCORBIC ACID) 500 MG tablet Take 500 mg by mouth daily.    . hydrochlorothiazide (MICROZIDE) 12.5 MG capsule TAKE 1 CAPSULE (12.5 MG TOTAL) BY MOUTH AS NEEDED (SWELLING). 90 capsule 1   No current facility-administered medications on file prior to visit.     Objective:  Objective  Physical Exam Vitals and nursing note reviewed.  Constitutional:      General: He is sleeping.     Appearance: He is well-developed.  HENT:     Head: Normocephalic and atraumatic.  Eyes:     Pupils: Pupils are equal, round, and reactive to light.  Neck:     Thyroid: No thyromegaly.  Cardiovascular:     Rate and Rhythm: Normal rate and regular rhythm.     Heart sounds: No murmur heard.   Pulmonary:     Effort: Pulmonary effort is normal. No respiratory  distress.     Breath sounds: Normal breath sounds. No wheezing or rales.  Chest:     Chest wall: No tenderness.  Musculoskeletal:        General: Swelling present. No tenderness.     Cervical back: Normal range of motion and neck supple.  Skin:    General: Skin is warm and dry.  Neurological:     Mental Status: He is oriented to person, place, and time.  Psychiatric:        Behavior: Behavior normal.        Thought Content: Thought content normal.        Judgment: Judgment normal.    BP (!) 142/86 (BP Location: Left Arm, Patient Position: Sitting, Cuff Size: Normal)   Pulse 60    Temp 98.1 F (36.7 C) (Oral)   Resp 18   Ht 5\' 8"  (1.727 m)   Wt 162 lb 12.8 oz (73.8 kg)   SpO2 98%   BMI 24.75 kg/m  Wt Readings from Last 3 Encounters:  07/26/20 162 lb 12.8 oz (73.8 kg)  02/20/20 161 lb 12.8 oz (73.4 kg)  01/19/20 160 lb 7.9 oz (72.8 kg)     Lab Results  Component Value Date   WBC 8.6 02/27/2018   HGB 14.1 02/27/2018   HCT 40.9 02/27/2018   PLT 229 02/27/2018   GLUCOSE 85 01/22/2020   CHOL 101 01/22/2020   TRIG 58.0 01/22/2020   HDL 47.80 01/22/2020   LDLCALC 42 01/22/2020   ALT 19 01/22/2020   AST 29 01/22/2020   NA 136 01/22/2020   K 4.1 01/22/2020   CL 100 01/22/2020   CREATININE 1.15 01/22/2020   BUN 24 (H) 01/22/2020   CO2 30 01/22/2020   TSH 1.830 01/25/2018   PSA 0.35 12/27/2009   INR 1.2 02/27/2018   HGBA1C 5.0 05/21/2012    No results found.   Assessment & Plan:  Plan  I have changed Habib E. Hislop Jr.'s clorazepate and metoprolol. I am also having him maintain his b complex vitamins, Calcium Carbonate-Vitamin D, fish oil-omega-3 fatty acids, cromolyn, PreserVision AREDS 2, vitamin C, Multiple Vitamins-Minerals (MULTIVITAMIN PO), hydrochlorothiazide, lisinopril, Eliquis, fenofibrate, simvastatin, and doxazosin.  Meds ordered this encounter  Medications  . clorazepate (TRANXENE) 15 MG tablet    Sig: 1 po qhs prn    Dispense:  30 tablet    Refill:  1    This request is for a new prescription for a controlled substance as required by Federal/State law.  . metoprolol (TOPROL-XL) 200 MG 24 hr tablet    Sig: Take 0.5 tablets (100 mg total) by mouth daily.    Dispense:  45 tablet    Refill:  3  . doxazosin (CARDURA) 4 MG tablet    Sig: TAKE 1 TABLET BY MOUTH EVERYDAY AT BEDTIME    Dispense:  90 tablet    Refill:  1    Problem List Items Addressed This Visit      Unprioritized   AI (aortic insufficiency)    Per cardiology      Relevant Medications   metoprolol (TOPROL-XL) 200 MG 24 hr tablet   doxazosin (CARDURA) 4 MG tablet     Anxiety state    Stable con't meds      Relevant Medications   clorazepate (TRANXENE) 15 MG tablet   Essential hypertension    Well controlled, no changes to meds. Encouraged heart healthy diet such as the DASH diet and exercise as tolerated.  Relevant Medications   metoprolol (TOPROL-XL) 200 MG 24 hr tablet   doxazosin (CARDURA) 4 MG tablet   Other Relevant Orders   Lipid panel   Comprehensive metabolic panel   Hyperlipidemia    Tolerating statin, encouraged heart healthy diet, avoid trans fats, minimize simple carbs and saturated fats. Increase exercise as tolerated      Relevant Medications   metoprolol (TOPROL-XL) 200 MG 24 hr tablet   doxazosin (CARDURA) 4 MG tablet    Other Visit Diagnoses    Nocturia more than twice per night    -  Primary   Relevant Orders   PSA   Generalized anxiety disorder       Relevant Medications   clorazepate (TRANXENE) 15 MG tablet   Other Relevant Orders   Lipid panel   Comprehensive metabolic panel      Follow-up: Return in about 6 months (around 01/26/2021), or if symptoms worsen or fail to improve.  [name], DO

## 2020-07-27 LAB — COMPREHENSIVE METABOLIC PANEL
ALT: 19 U/L (ref 0–53)
AST: 28 U/L (ref 0–37)
Albumin: 4 g/dL (ref 3.5–5.2)
Alkaline Phosphatase: 30 U/L — ABNORMAL LOW (ref 39–117)
BUN: 21 mg/dL (ref 6–23)
CO2: 29 mEq/L (ref 19–32)
Calcium: 9 mg/dL (ref 8.4–10.5)
Chloride: 103 mEq/L (ref 96–112)
Creatinine, Ser: 1.08 mg/dL (ref 0.40–1.50)
GFR: 64.22 mL/min (ref >60.00)
Glucose, Bld: 86 mg/dL (ref 70–99)
Potassium: 4.4 mEq/L (ref 3.5–5.1)
Sodium: 136 mEq/L (ref 135–145)
Total Bilirubin: 0.9 mg/dL (ref 0.2–1.2)
Total Protein: 5.9 g/dL — ABNORMAL LOW (ref 6.0–8.3)

## 2020-07-27 LAB — LIPID PANEL
Cholesterol: 98 mg/dL (ref 0–200)
HDL: 46.5 mg/dL (ref >39.00)
LDL Cholesterol: 42 mg/dL (ref 0–99)
NonHDL: 51.6
Total CHOL/HDL Ratio: 2
Triglycerides: 50 mg/dL (ref 0.0–149.0)
VLDL: 10 mg/dL (ref 0.0–40.0)

## 2020-08-06 ENCOUNTER — Other Ambulatory Visit: Payer: Self-pay | Admitting: Family Medicine

## 2020-08-06 DIAGNOSIS — F411 Generalized anxiety disorder: Secondary | ICD-10-CM

## 2020-08-10 NOTE — Telephone Encounter (Signed)
Can you deny refill please? To early was refilled on 07/26/20.

## 2020-08-12 DIAGNOSIS — H43813 Vitreous degeneration, bilateral: Secondary | ICD-10-CM | POA: Diagnosis not present

## 2020-08-12 DIAGNOSIS — H353231 Exudative age-related macular degeneration, bilateral, with active choroidal neovascularization: Secondary | ICD-10-CM | POA: Diagnosis not present

## 2020-08-12 DIAGNOSIS — H35373 Puckering of macula, bilateral: Secondary | ICD-10-CM | POA: Diagnosis not present

## 2020-09-14 ENCOUNTER — Ambulatory Visit: Payer: Medicare Other | Attending: Internal Medicine

## 2020-09-14 DIAGNOSIS — Z23 Encounter for immunization: Secondary | ICD-10-CM

## 2020-09-14 NOTE — Progress Notes (Signed)
   [facility]  Name:  Calil Amor.    MRN: [id number] DOB: Sep 20, 1928  09/14/2020  Mr. Holliman was observed post Covid-19 immunization for 15 minutes without incident. He was provided with Vaccine Information Sheet and instruction to access the V-Safe system.   Mr. Riches was instructed to call 911 with any severe reactions post vaccine: [name] Difficulty breathing  . Swelling of face and throat  . A fast heartbeat  . A bad rash all over body  . Dizziness and weakness

## 2020-10-05 ENCOUNTER — Other Ambulatory Visit: Payer: Self-pay | Admitting: Family Medicine

## 2020-10-05 ENCOUNTER — Other Ambulatory Visit: Payer: Self-pay | Admitting: Cardiovascular Disease

## 2020-10-05 DIAGNOSIS — F411 Generalized anxiety disorder: Secondary | ICD-10-CM

## 2020-10-05 NOTE — Telephone Encounter (Signed)
Last written:08/31/20 Last ov:07/26/20 Next ov:12/2020 Contract:07/02/18 UDS:

## 2020-10-07 DIAGNOSIS — H353231 Exudative age-related macular degeneration, bilateral, with active choroidal neovascularization: Secondary | ICD-10-CM | POA: Diagnosis not present

## 2020-10-07 DIAGNOSIS — H35373 Puckering of macula, bilateral: Secondary | ICD-10-CM | POA: Diagnosis not present

## 2020-10-07 DIAGNOSIS — H43813 Vitreous degeneration, bilateral: Secondary | ICD-10-CM | POA: Diagnosis not present

## 2020-10-08 DIAGNOSIS — C61 Malignant neoplasm of prostate: Secondary | ICD-10-CM | POA: Diagnosis not present

## 2020-10-20 ENCOUNTER — Ambulatory Visit: Payer: Medicare Other | Admitting: Urology

## 2020-10-20 DIAGNOSIS — R351 Nocturia: Secondary | ICD-10-CM | POA: Diagnosis not present

## 2020-10-20 DIAGNOSIS — C61 Malignant neoplasm of prostate: Secondary | ICD-10-CM | POA: Diagnosis not present

## 2020-10-24 ENCOUNTER — Other Ambulatory Visit: Payer: Self-pay | Admitting: Family Medicine

## 2020-10-24 DIAGNOSIS — I480 Paroxysmal atrial fibrillation: Secondary | ICD-10-CM

## 2020-11-08 ENCOUNTER — Other Ambulatory Visit: Payer: Self-pay | Admitting: Family Medicine

## 2020-11-08 DIAGNOSIS — I10 Essential (primary) hypertension: Secondary | ICD-10-CM

## 2020-11-25 DIAGNOSIS — H353231 Exudative age-related macular degeneration, bilateral, with active choroidal neovascularization: Secondary | ICD-10-CM | POA: Diagnosis not present

## 2020-11-25 DIAGNOSIS — H43813 Vitreous degeneration, bilateral: Secondary | ICD-10-CM | POA: Diagnosis not present

## 2020-11-25 DIAGNOSIS — H35373 Puckering of macula, bilateral: Secondary | ICD-10-CM | POA: Diagnosis not present

## 2020-12-06 ENCOUNTER — Other Ambulatory Visit: Payer: Self-pay | Admitting: Family Medicine

## 2020-12-06 DIAGNOSIS — F411 Generalized anxiety disorder: Secondary | ICD-10-CM

## 2020-12-06 NOTE — Telephone Encounter (Signed)
Requesting: clorazepate 15mg  Contract: 07/12/2018 UDS: 07/12/2018 Last Visit: 07/26/2020 Next Visit: 01/28/2021 Last Refill:  10/05/2020 #30 and 1RF  Please Advise

## 2020-12-28 DIAGNOSIS — D229 Melanocytic nevi, unspecified: Secondary | ICD-10-CM | POA: Diagnosis not present

## 2020-12-28 DIAGNOSIS — L708 Other acne: Secondary | ICD-10-CM | POA: Diagnosis not present

## 2020-12-28 DIAGNOSIS — L821 Other seborrheic keratosis: Secondary | ICD-10-CM | POA: Diagnosis not present

## 2020-12-28 DIAGNOSIS — L814 Other melanin hyperpigmentation: Secondary | ICD-10-CM | POA: Diagnosis not present

## 2020-12-28 DIAGNOSIS — L819 Disorder of pigmentation, unspecified: Secondary | ICD-10-CM | POA: Diagnosis not present

## 2020-12-28 DIAGNOSIS — L570 Actinic keratosis: Secondary | ICD-10-CM | POA: Diagnosis not present

## 2021-01-13 DIAGNOSIS — H35373 Puckering of macula, bilateral: Secondary | ICD-10-CM | POA: Diagnosis not present

## 2021-01-13 DIAGNOSIS — H43813 Vitreous degeneration, bilateral: Secondary | ICD-10-CM | POA: Diagnosis not present

## 2021-01-13 DIAGNOSIS — H353231 Exudative age-related macular degeneration, bilateral, with active choroidal neovascularization: Secondary | ICD-10-CM | POA: Diagnosis not present

## 2021-01-31 ENCOUNTER — Ambulatory Visit: Payer: Medicare Other | Admitting: *Deleted

## 2021-02-07 ENCOUNTER — Encounter: Payer: Self-pay | Admitting: Family Medicine

## 2021-02-07 ENCOUNTER — Other Ambulatory Visit: Payer: Self-pay

## 2021-02-07 ENCOUNTER — Ambulatory Visit (INDEPENDENT_AMBULATORY_CARE_PROVIDER_SITE_OTHER): Payer: Medicare Other | Admitting: Family Medicine

## 2021-02-07 VITALS — BP 110/80 | HR 64 | Temp 98.5°F | Resp 18 | Ht 68.0 in | Wt 168.0 lb

## 2021-02-07 DIAGNOSIS — F411 Generalized anxiety disorder: Secondary | ICD-10-CM

## 2021-02-07 DIAGNOSIS — E785 Hyperlipidemia, unspecified: Secondary | ICD-10-CM | POA: Diagnosis not present

## 2021-02-07 DIAGNOSIS — I10 Essential (primary) hypertension: Secondary | ICD-10-CM

## 2021-02-07 MED ORDER — CLORAZEPATE DIPOTASSIUM 15 MG PO TABS: ORAL_TABLET | ORAL | 1 refills | Status: DC

## 2021-02-07 NOTE — Assessment & Plan Note (Signed)
Encouraged heart healthy diet, increase exercise, avoid trans fats, consider a krill oil cap daily 

## 2021-02-07 NOTE — Patient Instructions (Signed)

## 2021-02-07 NOTE — Assessment & Plan Note (Signed)
Well controlled, no changes to meds. Encouraged heart healthy diet such as the DASH diet and exercise as tolerated.  °

## 2021-02-07 NOTE — Progress Notes (Signed)
Patient ID: Mark Vea., male    DOB: [birth year]  Age: 87 y.o. MRN: [id number]    Subjective:  Subjective  HPI Mark Stark. presents for f/u bp and cholesterol.  No complaints  Review of Systems  Constitutional: Negative for appetite change, diaphoresis, fatigue and unexpected weight change.  Eyes: Negative for pain, redness and visual disturbance.  Respiratory: Negative for cough, chest tightness, shortness of breath and wheezing.   Cardiovascular: Negative for chest pain, palpitations and leg swelling.  Endocrine: Negative for cold intolerance, heat intolerance, polydipsia, polyphagia and polyuria.  Genitourinary: Negative for difficulty urinating, dysuria and frequency.  Neurological: Negative for dizziness, light-headedness, numbness and headaches.    History Past Medical History:  Diagnosis Date  . Anxiety   . Diverticulitis   . Dyslipidemia   . Hypertension   . 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 Prostate cancer [facility])     He has a past surgical history that includes Cataract extraction (01/27/2010); Cataract extraction (03/04/2010); Prostate seed implants; Prostate surgery; and Cardioversion (N/A, 03/01/2018).   His family history includes Alzheimer's disease in his mother; Cancer (age of onset: 41) in his brother; Dementia in his mother; Mental illness in his mother.He reports that he has never smoked. He has never used smokeless tobacco. He reports that he does not drink alcohol and does not use drugs.  Current Outpatient Medications on File Prior to Visit  Medication Sig Dispense Refill  . b complex vitamins tablet Take 1 tablet by mouth daily.    . Calcium Carbonate-Vitamin D 600-400 MG-UNIT tablet Take 1 tablet by mouth daily.    . cromolyn (NASALCROM) 5.2 MG/ACT nasal spray Place 2 sprays into the nose 2 (two) times daily.    [name] doxazosin (CARDURA) 4 MG tablet Take 1 tablet (4 mg total) by mouth at bedtime. 90 tablet 1  . ELIQUIS 5 MG TABS tablet TAKE 1  TABLET BY MOUTH TWICE A DAY 180 tablet 1  . fenofibrate 160 MG tablet Take 1 tablet (160 mg total) by mouth daily. 90 tablet 1  . fish oil-omega-3 fatty acids 1000 MG capsule Take 4 g by mouth daily.    [name] lisinopril (ZESTRIL) 20 MG tablet TAKE 1 TABLET BY MOUTH EVERY DAY 90 tablet 1  . metoprolol (TOPROL-XL) 200 MG 24 hr tablet Take 0.5 tablets (100 mg total) by mouth daily. 45 tablet 3  . Multiple Vitamins-Minerals (MULTIVITAMIN PO) Take 1 tablet by mouth daily.    . Multiple Vitamins-Minerals (PRESERVISION AREDS 2) CAPS 2 po qd (Patient taking differently: Take 2 capsules by mouth daily.)    . simvastatin (ZOCOR) 20 MG tablet Take 1 tablet (20 mg total) by mouth at bedtime. 90 tablet 1  . vitamin C (ASCORBIC ACID) 500 MG tablet Take 500 mg by mouth daily.    . hydrochlorothiazide (MICROZIDE) 12.5 MG capsule TAKE 1 CAPSULE (12.5 MG TOTAL) BY MOUTH AS NEEDED (SWELLING). 90 capsule 1   No current facility-administered medications on file prior to visit.     Objective:  Objective  Physical Exam Vitals and nursing note reviewed.  Constitutional:      General: He is sleeping. Vital signs are normal.     Appearance: He is well-developed and well-nourished.  HENT:     Head: Normocephalic and atraumatic.     Mouth/Throat:     Mouth: Oropharynx is clear and moist.  Eyes:     Extraocular Movements: EOM normal.     Pupils: Pupils are equal,  round, and reactive to light.  Neck:     Thyroid: No thyromegaly.  Cardiovascular:     Rate and Rhythm: Normal rate and regular rhythm.     Heart sounds: No murmur heard.   Pulmonary:     Effort: Pulmonary effort is normal. No respiratory distress.     Breath sounds: Normal breath sounds. No wheezing or rales.  Chest:     Chest wall: No tenderness.  Musculoskeletal:        General: No tenderness.     Cervical back: Normal range of motion and neck supple.     Right lower leg: 1+ Pitting Edema present.     Left lower leg: 1+ Pitting Edema present.        Legs:  Skin:    General: Skin is warm and dry.  Neurological:     Mental Status: He is oriented to person, place, and time.  Psychiatric:        Mood and Affect: Mood and affect normal.        Behavior: Behavior normal.        Thought Content: Thought content normal.        Judgment: Judgment normal.    BP 110/80 (BP Location: Right Arm, Patient Position: Sitting, Cuff Size: Normal)   Pulse 64   Temp 98.5 F (36.9 C) (Oral)   Resp 18   Ht 5\' 8"  (1.727 m)   Wt 168 lb (76.2 kg)   SpO2 98%   BMI 25.54 kg/m  Wt Readings from Last 3 Encounters:  02/07/21 168 lb (76.2 kg)  07/26/20 162 lb 12.8 oz (73.8 kg)  02/20/20 161 lb 12.8 oz (73.4 kg)     Lab Results  Component Value Date   WBC 8.6 02/27/2018   HGB 14.1 02/27/2018   HCT 40.9 02/27/2018   PLT 229 02/27/2018   GLUCOSE 86 07/26/2020   CHOL 98 07/26/2020   TRIG 50.0 07/26/2020   HDL 46.50 07/26/2020   LDLCALC 42 07/26/2020   ALT 19 07/26/2020   AST 28 07/26/2020   NA 136 07/26/2020   K 4.4 07/26/2020   CL 103 07/26/2020   CREATININE 1.08 07/26/2020   BUN 21 07/26/2020   CO2 29 07/26/2020   TSH 1.830 01/25/2018   PSA 0.35 12/27/2009   INR 1.2 02/27/2018   HGBA1C 5.0 05/21/2012    No results found.   Assessment & Plan:  Plan  I have changed Mark E. Cuello Jr.'s clorazepate. I am also having him maintain his b complex vitamins, Calcium Carbonate-Vitamin D, fish oil-omega-3 fatty acids, cromolyn, PreserVision AREDS 2, vitamin C, Multiple Vitamins-Minerals (MULTIVITAMIN PO), hydrochlorothiazide, metoprolol, lisinopril, Eliquis, simvastatin, fenofibrate, and doxazosin.  Meds ordered this encounter  Medications  . clorazepate (TRANXENE) 15 MG tablet    Sig: 1 po qhs prn    Dispense:  30 tablet    Refill:  1    Not to exceed 4 additional fills before 04/03/2021    Problem List Items Addressed This Visit      Unprioritized   Essential hypertension    Well controlled, no changes to meds. Encouraged  heart healthy diet such as the DASH diet and exercise as tolerated.       Hyperlipidemia - Primary    Encouraged heart healthy diet, increase exercise, avoid trans fats, consider a krill oil cap daily      Relevant Orders   Lipid panel   Microalbumin / creatinine urine ratio   CBC with Differential/Platelet  Comprehensive metabolic panel    Other Visit Diagnoses    Generalized anxiety disorder       Relevant Medications   clorazepate (TRANXENE) 15 MG tablet   Primary hypertension       Relevant Orders   Lipid panel   Microalbumin / creatinine urine ratio   CBC with Differential/Platelet   Comprehensive metabolic panel      Follow-up: Return in about 6 months (around 08/07/2021), or if symptoms worsen or fail to improve, for hypertension, hyperlipidemia.  [name], DO

## 2021-02-08 LAB — LIPID PANEL
Cholesterol: 98 mg/dL (ref 0–200)
HDL: 44.8 mg/dL (ref >39.00)
LDL Cholesterol: 39 mg/dL (ref 0–99)
NonHDL: 53.22
Total CHOL/HDL Ratio: 2
Triglycerides: 69 mg/dL (ref 0.0–149.0)
VLDL: 13.8 mg/dL (ref 0.0–40.0)

## 2021-02-08 LAB — MICROALBUMIN / CREATININE URINE RATIO
Creatinine,U: 35.4 mg/dL
Microalb Creat Ratio: 2 mg/g (ref 0.0–30.0)
Microalb, Ur: <0.7 mg/dL (ref 0.0–1.9)

## 2021-02-08 LAB — COMPREHENSIVE METABOLIC PANEL
ALT: 19 U/L (ref 0–53)
AST: 25 U/L (ref 0–37)
Albumin: 3.9 g/dL (ref 3.5–5.2)
Alkaline Phosphatase: 30 U/L — ABNORMAL LOW (ref 39–117)
BUN: 21 mg/dL (ref 6–23)
CO2: 30 mEq/L (ref 19–32)
Calcium: 8.8 mg/dL (ref 8.4–10.5)
Chloride: 103 mEq/L (ref 96–112)
Creatinine, Ser: 1.2 mg/dL (ref 0.40–1.50)
GFR: 53.2 mL/min — ABNORMAL LOW (ref >60.00)
Glucose, Bld: 88 mg/dL (ref 70–99)
Potassium: 4.5 mEq/L (ref 3.5–5.1)
Sodium: 138 mEq/L (ref 135–145)
Total Bilirubin: 0.8 mg/dL (ref 0.2–1.2)
Total Protein: 6 g/dL (ref 6.0–8.3)

## 2021-02-08 LAB — CBC WITH DIFFERENTIAL/PLATELET
Basophils Absolute: 0.1 10*3/uL (ref 0.0–0.1)
Basophils Relative: 0.8 % (ref 0.0–3.0)
Eosinophils Absolute: 0.2 10*3/uL (ref 0.0–0.7)
Eosinophils Relative: 2.5 % (ref 0.0–5.0)
HCT: 38.7 % — ABNORMAL LOW (ref 39.0–52.0)
Hemoglobin: 13.2 g/dL (ref 13.0–17.0)
Lymphocytes Relative: 33.9 % (ref 12.0–46.0)
Lymphs Abs: 2.5 10*3/uL (ref 0.7–4.0)
MCHC: 34.2 g/dL (ref 30.0–36.0)
MCV: 95.8 fl (ref 78.0–100.0)
Monocytes Absolute: 1 10*3/uL (ref 0.1–1.0)
Monocytes Relative: 13.8 % — ABNORMAL HIGH (ref 3.0–12.0)
Neutro Abs: 3.6 10*3/uL (ref 1.4–7.7)
Neutrophils Relative %: 49 % (ref 43.0–77.0)
Platelets: 143 10*3/uL — ABNORMAL LOW (ref 150.0–400.0)
RBC: 4.04 Mil/uL — ABNORMAL LOW (ref 4.22–5.81)
RDW: 13.6 % (ref 11.5–15.5)
WBC: 7.4 10*3/uL (ref 4.0–10.5)

## 2021-02-09 ENCOUNTER — Other Ambulatory Visit: Payer: Self-pay | Admitting: Family Medicine

## 2021-02-09 DIAGNOSIS — D691 Qualitative platelet defects: Secondary | ICD-10-CM

## 2021-02-10 ENCOUNTER — Other Ambulatory Visit: Payer: Self-pay

## 2021-02-10 ENCOUNTER — Ambulatory Visit (INDEPENDENT_AMBULATORY_CARE_PROVIDER_SITE_OTHER): Payer: Medicare Other

## 2021-02-10 VITALS — BP 160/80 | HR 58 | Temp 97.9°F | Resp 16 | Ht 68.0 in | Wt 165.4 lb

## 2021-02-10 DIAGNOSIS — Z0000 Encounter for general adult medical examination without abnormal findings: Secondary | ICD-10-CM

## 2021-02-10 NOTE — Patient Instructions (Signed)
Mark Stark , Thank you for taking time to come for your Medicare Wellness Visit. I appreciate your ongoing commitment to your health goals. Please review the following plan we discussed and let me know if I can assist you in the future.   Screening recommendations/referrals: Colonoscopy: No longer required Recommended yearly ophthalmology/optometry visit for glaucoma screening and checkup Recommended yearly dental visit for hygiene and checkup  Vaccinations: Influenza vaccine: Up to date Pneumococcal vaccine: Completed vaccines Tdap vaccine: Up to date- Due-12/25/2022 Shingles vaccine: Discuss with pharmacy   Covid-19: Completed vaccines  Advanced directives: Please bring a copy for your chart  Conditions/risks identified: See problem list  Next appointment: Follow up in one year for your annual wellness visit. 02/16/2022 @ 11:00  Preventive Care 85 Years and Older, Male Preventive care refers to lifestyle choices and visits with your health care provider that can promote health and wellness. What does preventive care include?  A yearly physical exam. This is also called an annual well check.  Dental exams once or twice a year.  Routine eye exams. Ask your health care provider how often you should have your eyes checked.  Personal lifestyle choices, including:  Daily care of your teeth and gums.  Regular physical activity.  Eating a healthy diet.  Avoiding tobacco and drug use.  Limiting alcohol use.  Practicing safe sex.  Taking low doses of aspirin every day.  Taking vitamin and mineral supplements as recommended by your health care provider. What happens during an annual well check? The services and screenings done by your health care provider during your annual well check will depend on your age, overall health, lifestyle risk factors, and family history of disease. Counseling  Your health care provider may ask you questions about your:  Alcohol use.  Tobacco  use.  Drug use.  Emotional well-being.  Home and relationship well-being.  Sexual activity.  Eating habits.  History of falls.  Memory and ability to understand (cognition).  Work and work Statistician. Screening  You may have the following tests or measurements:  Height, weight, and BMI.  Blood pressure.  Lipid and cholesterol levels. These may be checked every 5 years, or more frequently if you are over 70 years old.  Skin check.  Lung cancer screening. You may have this screening every year starting at age 56 if you have a 30-pack-year history of smoking and currently smoke or have quit within the past 15 years.  Fecal occult blood test (FOBT) of the stool. You may have this test every year starting at age 28.  Flexible sigmoidoscopy or colonoscopy. You may have a sigmoidoscopy every 5 years or a colonoscopy every 10 years starting at age 23.  Prostate cancer screening. Recommendations will vary depending on your family history and other risks.  Hepatitis C blood test.  Hepatitis B blood test.  Sexually transmitted disease (STD) testing.  Diabetes screening. This is done by checking your blood sugar (glucose) after you have not eaten for a while (fasting). You may have this done every 1-3 years.  Abdominal aortic aneurysm (AAA) screening. You may need this if you are a current or former smoker.  Osteoporosis. You may be screened starting at age 48 if you are at high risk. Talk with your health care provider about your test results, treatment options, and if necessary, the need for more tests. Vaccines  Your health care provider may recommend certain vaccines, such as:  Influenza vaccine. This is recommended every year.  Tetanus, diphtheria,  and acellular pertussis (Tdap, Td) vaccine. You may need a Td booster every 10 years.  Zoster vaccine. You may need this after age 85.  Pneumococcal 13-valent conjugate (PCV13) vaccine. One dose is recommended after age  29.  Pneumococcal polysaccharide (PPSV23) vaccine. One dose is recommended after age 35. Talk to your health care provider about which screenings and vaccines you need and how often you need them. This information is not intended to replace advice given to you by your health care provider. Make sure you discuss any questions you have with your health care provider. Document Released: 01/07/2016 Document Revised: 08/30/2016 Document Reviewed: 10/12/2015 Elsevier Interactive Patient Education  2017 [employer] Prevention in the Home Falls can cause injuries. They can happen to people of all ages. There are many things you can do to make your home safe and to help prevent falls. What can I do on the outside of my home?  Regularly fix the edges of walkways and driveways and fix any cracks.  Remove anything that might make you trip as you walk through a door, such as a raised step or threshold.  Trim any bushes or trees on the path to your home.  Use bright outdoor lighting.  Clear any walking paths of anything that might make someone trip, such as rocks or tools.  Regularly check to see if handrails are loose or broken. Make sure that both sides of any steps have handrails.  Any raised decks and porches should have guardrails on the edges.  Have any leaves, snow, or ice cleared regularly.  Use sand or salt on walking paths during winter.  Clean up any spills in your garage right away. This includes oil or grease spills. What can I do in the bathroom?  Use night lights.  Install grab bars by the toilet and in the tub and shower. Do not use towel bars as grab bars.  Use non-skid mats or decals in the tub or shower.  If you need to sit down in the shower, use a plastic, non-slip stool.  Keep the floor dry. Clean up any water that spills on the floor as soon as it happens.  Remove soap buildup in the tub or shower regularly.  Attach bath mats securely with double-sided  non-slip rug tape.  Do not have throw rugs and other things on the floor that can make you trip. What can I do in the bedroom?  Use night lights.  Make sure that you have a light by your bed that is easy to reach.  Do not use any sheets or blankets that are too big for your bed. They should not hang down onto the floor.  Have a firm chair that has side arms. You can use this for support while you get dressed.  Do not have throw rugs and other things on the floor that can make you trip. What can I do in the kitchen?  Clean up any spills right away.  Avoid walking on wet floors.  Keep items that you use a lot in easy-to-reach places.  If you need to reach something above you, use a strong step stool that has a grab bar.  Keep electrical cords out of the way.  Do not use floor polish or wax that makes floors slippery. If you must use wax, use non-skid floor wax.  Do not have throw rugs and other things on the floor that can make you trip. What can I do with my  stairs?  Do not leave any items on the stairs.  Make sure that there are handrails on both sides of the stairs and use them. Fix handrails that are broken or loose. Make sure that handrails are as long as the stairways.  Check any carpeting to make sure that it is firmly attached to the stairs. Fix any carpet that is loose or worn.  Avoid having throw rugs at the top or bottom of the stairs. If you do have throw rugs, attach them to the floor with carpet tape.  Make sure that you have a light switch at the top of the stairs and the bottom of the stairs. If you do not have them, ask someone to add them for you. What else can I do to help prevent falls?  Wear shoes that:  Do not have high heels.  Have rubber bottoms.  Are comfortable and fit you well.  Are closed at the toe. Do not wear sandals.  If you use a stepladder:  Make sure that it is fully opened. Do not climb a closed stepladder.  Make sure that both  sides of the stepladder are locked into place.  Ask someone to hold it for you, if possible.  Clearly mark and make sure that you can see:  Any grab bars or handrails.  First and last steps.  Where the edge of each step is.  Use tools that help you move around (mobility aids) if they are needed. These include:  Canes.  Walkers.  Scooters.  Crutches.  Turn on the lights when you go into a dark area. Replace any light bulbs as soon as they burn out.  Set up your furniture so you have a clear path. Avoid moving your furniture around.  If any of your floors are uneven, fix them.  If there are any pets around you, be aware of where they are.  Review your medicines with your doctor. Some medicines can make you feel dizzy. This can increase your chance of falling. Ask your doctor what other things that you can do to help prevent falls. This information is not intended to replace advice given to you by your health care provider. Make sure you discuss any questions you have with your health care provider. Document Released: 10/07/2009 Document Revised: 05/18/2016 Document Reviewed: 01/15/2015 Elsevier Interactive Patient Education  2017 [employer].

## 2021-02-10 NOTE — Progress Notes (Signed)
Subjective:   French Kendra. is a 87 y.o. male who presents for Medicare Annual/Subsequent preventive examination.  Review of Systems     Cardiac Risk Factors include: male gender;advanced age (>51men, >60 women);hypertension;dyslipidemia     Objective:   Patient states he is communicating with his cardiologist regarding his elevated b/p readings  Today's Vitals   02/10/21 0937  BP: (!) 160/80  Pulse: (!) 58  Resp: 16  Temp: 97.9 F (36.6 C)  TempSrc: Oral  SpO2: 98%  Weight: 165 lb 6.4 oz (75 kg)  Height: 5\' 8"  (1.727 m)   Body mass index is 25.15 kg/m.  Advanced Directives 02/10/2021 01/19/2020 01/14/2019 03/01/2018 12/22/2016  Does Patient Have a Medical Advance Directive? Yes Yes Yes No Yes  Type of Paramedic of [place];Living will [place];Living will [place];Living will - Living will;Healthcare Power of Attorney  Does patient want to make changes to medical advance directive? - No - Patient declined No - Patient declined - No - Patient declined  Copy of [place] in Chart? No - copy requested Yes - validated most recent copy scanned in chart (See row information) Yes - validated most recent copy scanned in chart (See row information) - Yes  Would patient like information on creating a medical advance directive? - - - No - Patient declined -    Current Medications (verified) Outpatient Encounter Medications as of 02/10/2021  Medication Sig  . b complex vitamins tablet Take 1 tablet by mouth daily.  . Calcium Carbonate-Vitamin D 600-400 MG-UNIT tablet Take 1 tablet by mouth daily.  . clorazepate (TRANXENE) 15 MG tablet 1 po qhs prn  . cromolyn (NASALCROM) 5.2 MG/ACT nasal spray Place 2 sprays into the nose 2 (two) times daily.  [name] doxazosin (CARDURA) 4 MG tablet Take 1 tablet (4 mg total) by mouth at bedtime.  [name] ELIQUIS 5 MG TABS tablet TAKE 1 TABLET BY MOUTH TWICE A DAY  . fenofibrate  160 MG tablet Take 1 tablet (160 mg total) by mouth daily.  . fish oil-omega-3 fatty acids 1000 MG capsule Take 4 g by mouth daily.  [name] lisinopril (ZESTRIL) 20 MG tablet TAKE 1 TABLET BY MOUTH EVERY DAY  . metoprolol (TOPROL-XL) 200 MG 24 hr tablet Take 0.5 tablets (100 mg total) by mouth daily.  . Multiple Vitamins-Minerals (MULTIVITAMIN PO) Take 1 tablet by mouth daily.  . Multiple Vitamins-Minerals (PRESERVISION AREDS 2) CAPS 2 po qd (Patient taking differently: Take 2 capsules by mouth daily.)  . simvastatin (ZOCOR) 20 MG tablet Take 1 tablet (20 mg total) by mouth at bedtime.  . vitamin C (ASCORBIC ACID) 500 MG tablet Take 500 mg by mouth daily.  . hydrochlorothiazide (MICROZIDE) 12.5 MG capsule TAKE 1 CAPSULE (12.5 MG TOTAL) BY MOUTH AS NEEDED (SWELLING).   No facility-administered encounter medications on file as of 02/10/2021.    Allergies (verified) Sulfonamide derivatives   History: Past Medical History:  Diagnosis Date  . Anxiety   . Diverticulitis   . Dyslipidemia   . Hypertension   . 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 Prostate cancer [facility])    Past Surgical History:  Procedure Laterality Date  . CARDIOVERSION N/A 03/01/2018   Procedure: CARDIOVERSION;  Surgeon: Troy Sine, MD;  [facility];  Service: Cardiovascular;  Laterality: N/A;  . CATARACT EXTRACTION  01/27/2010   right  . CATARACT EXTRACTION  03/04/2010   left  . Prostate seed  implants    . PROSTATE SURGERY     Family History  Problem Relation Age of Onset  . Dementia Mother   . Alzheimer's disease Mother   . Mental illness Mother        ALZ DISEASE  . Cancer Brother 42       pancreatic cancer   Social History   Socioeconomic History  . Marital status: Widowed    Spouse name: Not on file  . Number of children: Not on file  . Years of education: Not on file  . Highest education level: Not on file  Occupational History  . Occupation: Professor---RETIRED    Employer:  RETIRED    Comment: Retired  Tobacco Use  . Smoking status: Never Smoker  . Smokeless tobacco: Never Used  Vaping Use  . Vaping Use: Never used  Substance and Sexual Activity  . Alcohol use: No    Comment: Little wine occasionally   . Drug use: No  . Sexual activity: Never    Partners: Female  Other Topics Concern  . Not on file  Social History Narrative  . Not on file   Social Determinants of Health   Financial Resource Strain: Low Risk   . Difficulty of Paying Living Expenses: Not hard at all  Food Insecurity: No Food Insecurity  . Worried About Charity fundraiser in the Last Year: Never true  . Ran Out of Food in the Last Year: Never true  Transportation Needs: No Transportation Needs  . Lack of Transportation (Medical): No  . Lack of Transportation (Non-Medical): No  Physical Activity: Sufficiently Active  . Days of Exercise per Week: 6 days  . Minutes of Exercise per Session: 60 min  Stress: No Stress Concern Present  . Feeling of Stress : Not at all  Social Connections: Moderately Isolated  . Frequency of Communication with Friends and Family: More than three times a week  . Frequency of Social Gatherings with Friends and Family: More than three times a week  . Attends Religious Services: 1 to 4 times per year  . Active Member of Clubs or Organizations: No  . Attends Archivist Meetings: Never  . Marital Status: Widowed    Tobacco Counseling Counseling given: Not Answered   Clinical Intake:  Pre-visit preparation completed: Yes  Pain : No/denies pain     Nutritional Status: BMI 25 -29 Overweight Nutritional Risks: None Diabetes: No  How often do you need to have someone help you when you read instructions, pamphlets, or other written materials from your doctor or pharmacy?: 1 - Never  Diabetic?No  Interpreter Needed?: No  Information entered by :: Dustin Flock LPn   Activities of Daily Living In your present state of health, do you  have any difficulty performing the following activities: 02/10/2021 02/07/2021  Hearing? N Y  Vision? N Y  Difficulty concentrating or making decisions? N N  Walking or climbing stairs? N Y  Dressing or bathing? N N  Doing errands, shopping? N N  Preparing Food and eating ? N -  Using the Toilet? N -  In the past six months, have you accidently leaked urine? N -  Do you have problems with loss of bowel control? N -  Managing your Medications? N -  Managing your Finances? N -  Housekeeping or managing your Housekeeping? N -  Some recent data might be hidden    Patient Care Team: [name], [name], DO as PCP - [place],  Joyice Faster, MD as PCP - Cardiology (Cardiology) Gerarda Fraction, MD as Referring Physician (Ophthalmology) Alyson Ingles Candee Furbish, MD as Consulting Physician (Urology) Druscilla Brownie, MD as Consulting Physician (Dermatology) Remi Haggard, MD as Consulting Physician (Urology)  Indicate any recent Medical Services you may have received from other than [facility] providers in the past year (date may be approximate).     Assessment:   This is a routine wellness examination for Gibbs.  Hearing/Vision screen  Hearing Screening   125Hz  250Hz  500Hz  1000Hz  2000Hz  3000Hz  4000Hz  6000Hz  8000Hz   Right ear:           Left ear:           Comments: Bilateral hearing aids  Vision Screening Comments: Wears glasses Last eye exam-12/2020-Dr. McCuen & Dr. Renford Dills  Dietary issues and exercise activities discussed: Current Exercise Habits: Home exercise routine, Type of exercise: stretching;walking, Time (Minutes): 60, Frequency (Times/Week): 6, Weekly Exercise (Minutes/Week): 360, Intensity: Mild, Exercise limited by: None identified  Goals    . Patient Stated     Eat healthier & decrease salt intake      Depression Screen PHQ 2/9 Scores 02/10/2021 02/07/2021 01/19/2020 01/14/2019 12/22/2016 12/21/2015 12/08/2014  PHQ - 2 Score 1 0 0 0 0 1 0    Fall Risk Fall Risk  02/10/2021  02/07/2021 02/07/2021 07/26/2020 01/19/2020  Falls in the past year? 0 0 0 - 0  Number falls in past yr: 0 0 0 - 0  Injury with Fall? 0 0 0 - 0  Risk for fall due to : - - - No Fall Risks -  Follow up Falls prevention discussed - Falls evaluation completed Falls evaluation completed;Falls prevention discussed Education provided;Falls prevention discussed    FALL RISK PREVENTION PERTAINING TO THE HOME:  Any stairs in or around the home? Yes  If so, are there any without handrails? Yes  pt advised of fall risk Home free of loose throw rugs in walkways, pet beds, electrical cords, etc? Yes  Adequate lighting in your home to reduce risk of falls? Yes   ASSISTIVE DEVICES UTILIZED TO PREVENT FALLS:  Life alert? No  Use of a cane, walker or w/c? No  Grab bars in the bathroom? Yes  Shower chair or bench in shower? No  Elevated toilet seat or a handicapped toilet? No   TIMED UP AND GO:  Was the test performed? Yes .  Length of time to ambulate 10 feet: 10 sec.   Gait slow and steady without use of assistive device  Cognitive Function:Normal cognitive status assessed by direct observation by this Nurse Health Advisor. No abnormalities found.   MMSE - Mini Mental State Exam 01/14/2019 12/22/2016  Orientation to time 5 5  Orientation to Place 5 5  Registration 3 3  Attention/ Calculation 5 5  Recall 3 3  Language- name 2 objects 2 2  Language- repeat 1 1  Language- follow 3 step command 3 3  Language- read & follow direction 1 1  Write a sentence 1 1  Copy design 1 1  Total score 30 30        Immunizations Immunization History  Administered Date(s) Administered  . Influenza Whole 11/14/2007, 09/30/2008, 09/08/2009, 08/31/2010, 09/03/2013  . Influenza, High Dose Seasonal PF 08/19/2015, 09/07/2017, 08/05/2019  . Influenza-Unspecified 09/07/2014, 09/15/2016, 08/03/2020  . PFIZER(Purple Top)SARS-COV-2 Vaccination 01/18/2020, 02/09/2020, 09/14/2020  . Pneumococcal Conjugate-13  09/07/2014  . Pneumococcal Polysaccharide-23 09/22/2009  . Tdap 12/25/2012  . Zoster 03/19/2008    TDAP status:  Up to date  Flu Vaccine status: Up to date  Pneumococcal vaccine status: Up to date  Covid-19 vaccine status: Completed vaccines  Qualifies for Shingles Vaccine? Yes   Zostavax completed Yes   Shingrix Completed?: No.    Education has been provided regarding the importance of this vaccine. Patient has been advised to call insurance company to determine out of pocket expense if they have not yet received this vaccine. Advised may also receive vaccine at local pharmacy or Health Dept. Verbalized acceptance and understanding.  Screening Tests Health Maintenance  Topic Date Due  . TETANUS/TDAP  12/25/2022  . INFLUENZA VACCINE  Completed  . COVID-19 Vaccine  Completed  . PNA vac Low Risk Adult  Completed    Health Maintenance  There are no preventive care reminders to display for this patient.  Colorectal cancer screening: No longer required.   Lung Cancer Screening: (Low Dose CT Chest recommended if Age 28-80 years, 30 pack-year currently smoking OR have quit w/in 15years.) does not qualify.    Additional Screening:  Hepatitis C Screening: does not qualify  Vision Screening: Recommended annual ophthalmology exams for early detection of glaucoma and other disorders of the eye. Is the patient up to date with their annual eye exam?  Yes  Who is the provider or what is the name of the office in which the patient attends annual eye exams? Dr. Ellie Lunch   Dental Screening: Recommended annual dental exams for proper oral hygiene  Community Resource Referral / Chronic Care Management: CRR required this visit?  No   CCM required this visit?  No      Plan:     I have personally reviewed and noted the following in the patient's chart:   . Medical and social history . Use of alcohol, tobacco or illicit drugs  . Current medications and supplements . Functional  ability and status . Nutritional status . Physical activity . Advanced directives . List of other physicians . Hospitalizations, surgeries, and ER visits in previous 12 months . Vitals . Screenings to include cognitive, depression, and falls . Referrals and appointments  In addition, I have reviewed and discussed with patient certain preventive protocols, quality metrics, and best practice recommendations. A written personalized care plan for preventive services as well as general preventive health recommendations were provided to patient.     Marta Antu, LPN   06/12/5092  Nurse Health Advisor  Nurse Notes: None

## 2021-03-05 ENCOUNTER — Other Ambulatory Visit: Payer: Self-pay | Admitting: Cardiovascular Disease

## 2021-03-10 DIAGNOSIS — H35373 Puckering of macula, bilateral: Secondary | ICD-10-CM | POA: Diagnosis not present

## 2021-03-10 DIAGNOSIS — H353231 Exudative age-related macular degeneration, bilateral, with active choroidal neovascularization: Secondary | ICD-10-CM | POA: Diagnosis not present

## 2021-03-10 DIAGNOSIS — H43813 Vitreous degeneration, bilateral: Secondary | ICD-10-CM | POA: Diagnosis not present

## 2021-03-23 ENCOUNTER — Other Ambulatory Visit: Payer: Self-pay

## 2021-03-23 ENCOUNTER — Other Ambulatory Visit (INDEPENDENT_AMBULATORY_CARE_PROVIDER_SITE_OTHER): Payer: Medicare Other

## 2021-03-23 DIAGNOSIS — D691 Qualitative platelet defects: Secondary | ICD-10-CM

## 2021-03-23 LAB — CBC WITH DIFFERENTIAL/PLATELET
Basophils Absolute: 0 10*3/uL (ref 0.0–0.1)
Basophils Relative: 0.5 % (ref 0.0–3.0)
Eosinophils Absolute: 0.2 10*3/uL (ref 0.0–0.7)
Eosinophils Relative: 4.1 % (ref 0.0–5.0)
HCT: 40.1 % (ref 39.0–52.0)
Hemoglobin: 13.5 g/dL (ref 13.0–17.0)
Lymphocytes Relative: 39.6 % (ref 12.0–46.0)
Lymphs Abs: 2.3 10*3/uL (ref 0.7–4.0)
MCHC: 33.5 g/dL (ref 30.0–36.0)
MCV: 95.5 fl (ref 78.0–100.0)
Monocytes Absolute: 0.9 10*3/uL (ref 0.1–1.0)
Monocytes Relative: 15.9 % — ABNORMAL HIGH (ref 3.0–12.0)
Neutro Abs: 2.3 10*3/uL (ref 1.4–7.7)
Neutrophils Relative %: 39.9 % — ABNORMAL LOW (ref 43.0–77.0)
Platelets: 142 10*3/uL — ABNORMAL LOW (ref 150.0–400.0)
RBC: 4.2 Mil/uL — ABNORMAL LOW (ref 4.22–5.81)
RDW: 13.4 % (ref 11.5–15.5)
WBC: 5.7 10*3/uL (ref 4.0–10.5)

## 2021-03-28 ENCOUNTER — Telehealth: Payer: Self-pay | Admitting: *Deleted

## 2021-03-28 ENCOUNTER — Ambulatory Visit (INDEPENDENT_AMBULATORY_CARE_PROVIDER_SITE_OTHER): Payer: Medicare Other | Admitting: Family Medicine

## 2021-03-28 ENCOUNTER — Encounter: Payer: Self-pay | Admitting: Family Medicine

## 2021-03-28 ENCOUNTER — Ambulatory Visit (HOSPITAL_BASED_OUTPATIENT_CLINIC_OR_DEPARTMENT_OTHER)
Admission: RE | Admit: 2021-03-28 | Discharge: 2021-03-28 | Disposition: A | Discharge status: Home / Self Care | Payer: Medicare Other | Source: Ambulatory Visit | Attending: Family Medicine | Admitting: Family Medicine

## 2021-03-28 ENCOUNTER — Other Ambulatory Visit: Payer: Self-pay

## 2021-03-28 VITALS — BP 140/80 | HR 60 | Temp 97.8°F | Resp 20 | Ht 68.0 in | Wt 163.8 lb

## 2021-03-28 DIAGNOSIS — D691 Qualitative platelet defects: Secondary | ICD-10-CM

## 2021-03-28 DIAGNOSIS — M25559 Pain in unspecified hip: Secondary | ICD-10-CM | POA: Insufficient documentation

## 2021-03-28 DIAGNOSIS — I480 Paroxysmal atrial fibrillation: Secondary | ICD-10-CM

## 2021-03-28 DIAGNOSIS — Z8546 Personal history of malignant neoplasm of prostate: Secondary | ICD-10-CM | POA: Diagnosis not present

## 2021-03-28 DIAGNOSIS — M76891 Other specified enthesopathies of right lower limb, excluding foot: Secondary | ICD-10-CM | POA: Diagnosis not present

## 2021-03-28 DIAGNOSIS — M1611 Unilateral primary osteoarthritis, right hip: Secondary | ICD-10-CM | POA: Diagnosis not present

## 2021-03-28 DIAGNOSIS — I351 Nonrheumatic aortic (valve) insufficiency: Secondary | ICD-10-CM | POA: Diagnosis not present

## 2021-03-28 DIAGNOSIS — M545 Low back pain, unspecified: Secondary | ICD-10-CM | POA: Diagnosis not present

## 2021-03-28 NOTE — Telephone Encounter (Signed)
Call Type Triage / Clinical Relationship To Patient Self Return Phone Number [phone] (Primary) Chief Complaint Hip pain Reason for Call Symptomatic / Request for Health Information Initial Comment Caller states, pt has hip pain and wanting to be seen. Translation No Nurse Assessment Nurse: Waymon Budge, RN, Vaughan Basta Date/Time (Eastern Time): 03/26/2021 10:19:05 AM Confirm and document reason for call. If symptomatic, describe symptoms. ---Caller states he is having hip pain. States it started on Thursday morning. Yesterday was bad in the morning, dissipating throughout the day. Intense pain this morning. Is up and walking around, is a bit better.

## 2021-03-28 NOTE — Assessment & Plan Note (Signed)
Only slightly abnormal but pt is concerned Will refer to hematology for further evaluation

## 2021-03-28 NOTE — Telephone Encounter (Signed)
Left message on machine to call back to schedule appointment if needed.

## 2021-03-28 NOTE — Patient Instructions (Signed)
Lumbar Sprain A lumbar sprain, which is sometimes called a low-back sprain, is a stretch or tear in the bands of tissue that hold bones and joints together (ligaments) in the lower back (lumbar spine). This type of injury occurs when you overextend or stretch these ligaments beyond their limits. Lumbar sprains can range from mild to severe. Mild sprains may involve stretching a ligament without tearing it. These may heal in 1-2 weeks. More severe sprains involve tearing of the ligament. These will cause more pain and may take 6-8 weeks to heal. What are the causes? This condition may be caused by:  Trauma, such as a fall or a hit to the body.  Twisting or overstretching the back. This may result from doing activities that need a lot of energy, such as lifting heavy objects. What increases the risk? This injury is more common in:  Athletes.  People with obesity.  People who do repeated lifting, bending, or other movements that involve their back. What are the signs or symptoms? Symptoms of this condition may include:  Sharp or dull pain in the lower back that does not go away. The pain may extend to the buttocks.  Stiffness or limited range of motion.  Sudden muscle tightening (spasms). How is this diagnosed? This condition may be diagnosed based on:  Your symptoms.  Your medical history.  A physical exam.  Imaging tests, such as: ? X-rays. ? MRI. How is this treated? Treatment for this condition may include:  Resting the injured area.  Applying heat and cold to the affected area.  Medicines for pain and inflammation, such as NSAIDs.  Prescription pain medicine and muscle relaxants may be needed for a short time.  Physical therapy. Follow these instructions at home: Managing pain, stiffness, and swelling  If directed, put ice on the injured area during the first 24 hours after your injury. ? Put ice in a plastic bag. ? Place a towel between your skin and the  bag. ? Leave the ice on for 20 minutes, 2-3 times a day.  If directed, apply heat to the affected area as often as told by your health care provider. Use the heat source that your health care provider recommends, such as a moist heat pack or a heating pad. ? Place a towel between your skin and the heat source. ? Leave the heat on for 20-30 minutes. ? Remove the heat if your skin turns bright red. This is especially important if you are unable to feel pain, heat, or cold. You may have a greater risk of getting burned.      Activity  Rest and return to your normal activities as told by your health care provider. Ask your health care provider what activities are safe for you.  Do exercises as told by your health care provider. Medicines  Take over-the-counter and prescription medicines only as told by your health care provider.  Ask your health care provider if the medicine prescribed to you: ? Requires you to avoid driving or using heavy machinery. ? Can cause constipation. You may need to take these actions to prevent or treat constipation:  Drink enough fluid to keep your urine pale yellow.  Take over-the-counter or prescription medicines.  Eat foods that are high in fiber, such as beans, whole grains, and fresh fruits and vegetables.  Limit foods that are high in fat and processed sugars, such as fried or sweet foods. Injury prevention To prevent a future low-back injury:  Always warm up  properly before physical activity or sports.  Cool down and stretch after being active.  Use correct form when playing sports and lifting heavy objects. Bend your knees before you lift heavy objects.  Use good posture when sitting and standing.  Stay physically fit and keep a healthy weight. ? Do at least 150 minutes of moderate-intensity exercise each week, such as brisk walking or water aerobics. ? Do strength exercises at least 2 times each week.   General instructions  Do not use any  products that contain nicotine or tobacco, such as cigarettes, e-cigarettes, and chewing tobacco. If you need help quitting, ask your health care provider.  Keep all follow-up visits as told by your health care provider. This is important. Contact a health care provider if:  Your back pain does not improve after 6 weeks of treatment.  Your symptoms get worse. Get help right away if:  Your back pain is severe.  You are unable to stand or walk.  You develop pain in your legs.  You develop weakness in your buttocks or legs.  You have difficulty controlling when you urinate or when you have a bowel movement. ? You have frequent, painful, or bloody urination. ? You have a temperature over 101.52F (38.3C). Summary  A lumbar sprain, which is sometimes called a low-back sprain, is a stretch or tear in the bands of tissue that hold bones and joints together (ligaments) in the lower back (lumbar spine).  Rest and return to your normal activities as told by your health care provider. Ask your health care provider what activities are safe for you.  If directed, apply heat and ice to the affected area as often as told by your health care provider.  Take over-the-counter and prescription medicines only as told by your health care provider.  Contact a health care provider if you have new or worsening symptoms. This information is not intended to replace advice given to you by your health care provider. Make sure you discuss any questions you have with your health care provider. Document Revised: 10/10/2018 Document Reviewed: 10/10/2018 Elsevier Patient Education  2021 [employer].

## 2021-03-28 NOTE — Progress Notes (Signed)
Patient ID: Mark Stark., male    DOB: 1928-11-20  Age: 87 y.o. MRN: [id number]    Subjective:  Subjective  HPI Mark Stark. presents for an office visit today. He complains of right hip pain. He states that the pain started 03/25/2021 morning and has gotten better overtime. He reports that the pain radiated to his right lateral upper leg, but it has subside. He notes that there is slight pain while walking. He denies any chest pain, SOB, fever, abdominal pain, cough, chills, sore throat, dysuria, urinary incontinence, HA, or N/VD at this time.   Review of Systems  Constitutional: Negative for chills, fatigue and fever.  HENT: Negative for congestion, ear pain, facial swelling, sinus pressure, sinus pain and sore throat.   Eyes: Negative for pain.  Respiratory: Negative for cough, shortness of breath and wheezing.   Cardiovascular: Negative for chest pain, palpitations and leg swelling.  Gastrointestinal: Negative for abdominal pain, blood in stool, constipation, diarrhea, nausea and vomiting.  Genitourinary: Negative for dysuria, frequency, hematuria and urgency.  Musculoskeletal:       (+) right hip pain  (+) right lateral upper leg pain secondary to right hip pain   Neurological: Negative for headaches.    History Past Medical History:  Diagnosis Date  . Anxiety   . Diverticulitis   . Dyslipidemia   . Hypertension   . 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 Prostate cancer [facility])     He has a past surgical history that includes Cataract extraction (01/27/2010); Cataract extraction (03/04/2010); Prostate seed implants; Prostate surgery; and Cardioversion (N/A, 03/01/2018).   His family history includes Alzheimer's disease in his mother; Cancer (age of onset: 89) in his brother; Dementia in his mother; Mental illness in his mother.He reports that he has never smoked. He has never used smokeless tobacco. He reports that he does not drink alcohol and does not use  drugs.  Current Outpatient Medications on File Prior to Visit  Medication Sig Dispense Refill  . b complex vitamins tablet Take 1 tablet by mouth daily.    . Calcium Carbonate-Vitamin D 600-400 MG-UNIT tablet Take 1 tablet by mouth daily.    . clorazepate (TRANXENE) 15 MG tablet 1 po qhs prn 30 tablet 1  . cromolyn (NASALCROM) 5.2 MG/ACT nasal spray Place 2 sprays into the nose 2 (two) times daily.    [name] doxazosin (CARDURA) 4 MG tablet Take 1 tablet (4 mg total) by mouth at bedtime. 90 tablet 1  . ELIQUIS 5 MG TABS tablet TAKE 1 TABLET BY MOUTH TWICE A DAY 180 tablet 1  . fenofibrate 160 MG tablet Take 1 tablet (160 mg total) by mouth daily. 90 tablet 1  . fish oil-omega-3 fatty acids 1000 MG capsule Take 4 g by mouth daily.    [name] lisinopril (ZESTRIL) 20 MG tablet TAKE 1 TABLET BY MOUTH EVERY DAY 90 tablet 3  . metoprolol (TOPROL-XL) 200 MG 24 hr tablet Take 0.5 tablets (100 mg total) by mouth daily. 45 tablet 3  . Multiple Vitamins-Minerals (MULTIVITAMIN PO) Take 1 tablet by mouth daily.    . Multiple Vitamins-Minerals (PRESERVISION AREDS 2) CAPS 2 po qd (Patient taking differently: Take 2 capsules by mouth daily.)    . simvastatin (ZOCOR) 20 MG tablet Take 1 tablet (20 mg total) by mouth at bedtime. 90 tablet 1  . vitamin C (ASCORBIC ACID) 500 MG tablet Take 500 mg by mouth daily.    . hydrochlorothiazide (MICROZIDE) 12.5 MG capsule  TAKE 1 CAPSULE (12.5 MG TOTAL) BY MOUTH AS NEEDED (SWELLING). 90 capsule 1   No current facility-administered medications on file prior to visit.     Objective:  Objective  Physical Exam Vitals and nursing note reviewed.  Constitutional:      General: He is not in acute distress.    Appearance: Normal appearance. He is well-developed. He is not ill-appearing.  HENT:     Head: Normocephalic and atraumatic.     Right Ear: External ear normal.     Left Ear: External ear normal.     Nose: Nose normal.  Eyes:     Extraocular Movements: Extraocular movements  intact.     Pupils: Pupils are equal, round, and reactive to light.  Cardiovascular:     Rate and Rhythm: Normal rate and regular rhythm.     Pulses: Normal pulses.     Heart sounds: Normal heart sounds. No murmur heard. No friction rub. No gallop.   Pulmonary:     Effort: Pulmonary effort is normal. No respiratory distress.     Breath sounds: Normal breath sounds. No wheezing, rhonchi or rales.  Abdominal:     General: Bowel sounds are normal. There is no distension.     Palpations: Abdomen is soft.     Tenderness: There is no abdominal tenderness. There is no guarding.     Hernia: No hernia is present.  Musculoskeletal:        General: Tenderness present. No signs of injury. Normal range of motion.     Cervical back: Normal range of motion and neck supple.     Right lower leg: No edema.     Left lower leg: No edema.     Comments: There is 5/5 strength present in his bilateral LE.   Skin:    General: Skin is warm and dry.  Neurological:     Mental Status: He is alert and oriented to person, place, and time.  Psychiatric:        Behavior: Behavior normal.        Thought Content: Thought content normal.    BP 140/80 (BP Location: Left Arm, Patient Position: Sitting, Cuff Size: Normal)   Pulse 60   Temp 97.8 F (36.6 C) (Oral)   Resp 20   Ht 5\' 8"  (1.727 m)   Wt 163 lb 12.8 oz (74.3 kg)   SpO2 97%   BMI 24.91 kg/m  Wt Readings from Last 3 Encounters:  03/28/21 163 lb 12.8 oz (74.3 kg)  02/10/21 165 lb 6.4 oz (75 kg)  02/07/21 168 lb (76.2 kg)     Lab Results  Component Value Date   WBC 5.7 03/23/2021   HGB 13.5 03/23/2021   HCT 40.1 03/23/2021   PLT 142.0 (L) 03/23/2021   GLUCOSE 88 02/07/2021   CHOL 98 02/07/2021   TRIG 69.0 02/07/2021   HDL 44.80 02/07/2021   LDLCALC 39 02/07/2021   ALT 19 02/07/2021   AST 25 02/07/2021   NA 138 02/07/2021   K 4.5 02/07/2021   CL 103 02/07/2021   CREATININE 1.20 02/07/2021   BUN 21 02/07/2021   CO2 30 02/07/2021   TSH  1.830 01/25/2018   PSA 0.35 12/27/2009   INR 1.2 02/27/2018   HGBA1C 5.0 05/21/2012   MICROALBUR <0.7 02/07/2021    No results found.   Assessment & Plan:  Plan    No orders of the defined types were placed in this encounter.   Problem List Items Addressed This  Visit      Unprioritized   Abnormal platelets ([place]) - Primary    Only slightly abnormal but pt is concerned Will refer to hematology for further evaluation       Relevant Orders   Ambulatory referral to Hematology   Hip pain    Probably from low back  Recheck xrays Pt does not want meds  It has improved since it started       Relevant Orders   DG Lumbar Spine Complete   DG Hip Unilat W OR W/O Pelvis 2-3 Views Right      Follow-up: Return in about 4 months (around 07/28/2021), or if symptoms worsen or fail to improve.   I,Gordon Zheng,acting as a Education administrator for [employer], DO.,have documented all relevant documentation on the behalf of [name], DO,as directed by  [name], DO while in the presence of [place], DO, have reviewed all documentation for this visit. The documentation on 03/28/21 for the exam, diagnosis, procedures, and orders are all accurate and complete.  [name], DO

## 2021-03-28 NOTE — Assessment & Plan Note (Signed)
Probably from low back  Recheck xrays Pt does not want meds  It has improved since it started

## 2021-03-29 ENCOUNTER — Telehealth: Payer: Self-pay | Admitting: Hematology and Oncology

## 2021-03-29 NOTE — Telephone Encounter (Signed)
Received a new hem referral from Dr. Etter Stark for abnormal platelets. Mark Stark has been cld and scheduled to see Dr. Alvy Stark on 4/6 at 1pm. Pt aware to arrive 30 minutes early.

## 2021-03-30 ENCOUNTER — Telehealth: Payer: Self-pay

## 2021-03-30 ENCOUNTER — Inpatient Hospital Stay: Payer: Medicare Other | Attending: Hematology and Oncology | Admitting: Hematology and Oncology

## 2021-03-30 ENCOUNTER — Other Ambulatory Visit: Payer: Self-pay

## 2021-03-30 ENCOUNTER — Inpatient Hospital Stay: Payer: Medicare Other

## 2021-03-30 ENCOUNTER — Encounter: Payer: Self-pay | Admitting: Hematology and Oncology

## 2021-03-30 DIAGNOSIS — Z923 Personal history of irradiation: Secondary | ICD-10-CM | POA: Diagnosis not present

## 2021-03-30 DIAGNOSIS — M545 Low back pain, unspecified: Secondary | ICD-10-CM | POA: Diagnosis not present

## 2021-03-30 DIAGNOSIS — M1611 Unilateral primary osteoarthritis, right hip: Secondary | ICD-10-CM | POA: Diagnosis not present

## 2021-03-30 DIAGNOSIS — D696 Thrombocytopenia, unspecified: Secondary | ICD-10-CM | POA: Diagnosis not present

## 2021-03-30 DIAGNOSIS — Z79899 Other long term (current) drug therapy: Secondary | ICD-10-CM

## 2021-03-30 DIAGNOSIS — I10 Essential (primary) hypertension: Secondary | ICD-10-CM | POA: Diagnosis not present

## 2021-03-30 DIAGNOSIS — E785 Hyperlipidemia, unspecified: Secondary | ICD-10-CM

## 2021-03-30 DIAGNOSIS — Z882 Allergy status to sulfonamides status: Secondary | ICD-10-CM | POA: Insufficient documentation

## 2021-03-30 DIAGNOSIS — Z818 Family history of other mental and behavioral disorders: Secondary | ICD-10-CM | POA: Diagnosis not present

## 2021-03-30 DIAGNOSIS — Z8546 Personal history of malignant neoplasm of prostate: Secondary | ICD-10-CM

## 2021-03-30 DIAGNOSIS — Z114 Encounter for screening for human immunodeficiency virus [HIV]: Secondary | ICD-10-CM

## 2021-03-30 DIAGNOSIS — D72821 Monocytosis (symptomatic): Secondary | ICD-10-CM | POA: Insufficient documentation

## 2021-03-30 DIAGNOSIS — Z800 Family history of malignant neoplasm of digestive organs: Secondary | ICD-10-CM | POA: Insufficient documentation

## 2021-03-30 LAB — HEPATITIS C ANTIBODY: HCV Ab: NONREACTIVE

## 2021-03-30 LAB — CBC WITH DIFFERENTIAL/PLATELET
Abs Immature Granulocytes: 0.03 10*3/uL (ref 0.00–0.07)
Basophils Absolute: 0.1 10*3/uL (ref 0.0–0.1)
Basophils Relative: 1 %
Eosinophils Absolute: 0.1 10*3/uL (ref 0.0–0.5)
Eosinophils Relative: 2 %
HCT: 40.1 % (ref 39.0–52.0)
Hemoglobin: 13.7 g/dL (ref 13.0–17.0)
Immature Granulocytes: 1 %
Lymphocytes Relative: 32 %
Lymphs Abs: 2.1 10*3/uL (ref 0.7–4.0)
MCH: 32.2 pg (ref 26.0–34.0)
MCHC: 34.2 g/dL (ref 30.0–36.0)
MCV: 94.4 fL (ref 80.0–100.0)
Monocytes Absolute: 1 10*3/uL (ref 0.1–1.0)
Monocytes Relative: 16 %
Neutro Abs: 3.1 10*3/uL (ref 1.7–7.7)
Neutrophils Relative %: 48 %
Platelets: 144 10*3/uL — ABNORMAL LOW (ref 150–400)
RBC: 4.25 MIL/uL (ref 4.22–5.81)
RDW: 12.7 % (ref 11.5–15.5)
WBC: 6.4 10*3/uL (ref 4.0–10.5)
nRBC: 0 % (ref 0.0–0.2)

## 2021-03-30 LAB — HIV ANTIBODY (ROUTINE TESTING W REFLEX): HIV Screen 4th Generation wRfx: NONREACTIVE

## 2021-03-30 LAB — VITAMIN B12: Vitamin B-12: 439 pg/mL (ref 180–914)

## 2021-03-30 LAB — SEDIMENTATION RATE: Sed Rate: 3 mm/hr (ref 0–16)

## 2021-03-30 NOTE — Chronic Care Management (AMB) (Signed)
  Chronic Care Management   Note  03/30/2021 Name: Mark Stark. MRN: [id number] DOB: 08/09/1928  [name] is a 87 y.o. year old male who is a primary care patient of [name], DO. I reached out to [employer]. by phone today in response to a referral sent by [name]'s PCP, [name], [name], DO     [name] was given information about Chronic Care Management services today including:  1. CCM service includes personalized support from designated clinical staff supervised by his physician, including individualized plan of care and coordination with other care providers 2. 24/7 contact phone numbers for assistance for urgent and routine care needs. 3. Service will only be billed when office clinical staff spend 20 minutes or more in a month to coordinate care. 4. Only one practitioner may furnish and bill the service in a calendar month. 5. The patient may stop CCM services at any time (effective at the end of the month) by phone call to the office staff. 6. The patient will be responsible for cost sharing (co-pay) of up to 20% of the service fee (after annual deductible is met).  Patient agreed to services and verbal consent obtained.   Follow up plan: Telephone appointment with care management team member scheduled for:04/05/2021  Noreene Larsson, [place], [place], [place] [address] Direct Dial: [phone] [email or web address]: [email or web address]

## 2021-03-30 NOTE — Progress Notes (Signed)
[facility] CONSULT NOTE  Patient Care Team: [name], [name], DO as PCP - General Troy Sine, MD as PCP - Cardiology (Cardiology) Gerarda Fraction, MD as Referring Physician (Ophthalmology) Alyson Ingles Candee Furbish, MD as Consulting Physician (Urology) Druscilla Brownie, MD as Consulting Physician (Dermatology) Remi Haggard, MD as Consulting Physician (Urology)  CHIEF COMPLAINTS/PURPOSE OF CONSULTATION:  New onset of thrombocytopenia and mild monocytosis  HISTORY OF PRESENTING ILLNESS:  Mark Stark. 87 y.o. male is here because of thrombocytopenia and abnormal changes in his CBC  He was found to have abnormal CBC from recent blood work from primary care doctor The patient have remote history of prostate cancer status post seed radiation According to the patient, he follows at [facility] urology on an annual basis and his last PSA was 0.93, drawn around January of this year He denies urinary symptoms He denies recent bruising/bleeding, such as spontaneous epistaxis, hematuria, melena or hematochezia  The patient denies history of liver disease, exposure to heparin, history of cardiac murmur/prior cardiovascular surgery or recent new medications He denies prior blood or platelet transfusions He denies recent infection  MEDICAL HISTORY:  Past Medical History:  Diagnosis Date  . Anxiety   . Diverticulitis   . Dyslipidemia   . Hypertension   . 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 Prostate cancer [facility])     SURGICAL HISTORY: Past Surgical History:  Procedure Laterality Date  . CARDIOVERSION N/A 03/01/2018   Procedure: CARDIOVERSION;  Surgeon: Troy Sine, MD;  Location: [facility] ENDOSCOPY;  Service: Cardiovascular;  Laterality: N/A;  . CATARACT EXTRACTION  01/27/2010   right  . CATARACT EXTRACTION  03/04/2010   left  . Prostate seed implants    . PROSTATE SURGERY      SOCIAL HISTORY: Social History   Socioeconomic History  . Marital status:  Widowed    Spouse name: Not on file  . Number of children: Not on file  . Years of education: Not on file  . Highest education level: Not on file  Occupational History  . Occupation: Professor---RETIRED    Employer: RETIRED    Comment: Retired  Tobacco Use  . Smoking status: Never Smoker  . Smokeless tobacco: Never Used  Vaping Use  . Vaping Use: Never used  Substance and Sexual Activity  . Alcohol use: No    Comment: Little wine occasionally   . Drug use: No  . Sexual activity: Never    Partners: Female  Other Topics Concern  . Not on file  Social History Narrative  . Not on file   Social Determinants of Health   Financial Resource Strain: Low Risk   . Difficulty of Paying Living Expenses: Not hard at all  Food Insecurity: No Food Insecurity  . Worried About Charity fundraiser in the Last Year: Never true  . Ran Out of Food in the Last Year: Never true  Transportation Needs: No Transportation Needs  . Lack of Transportation (Medical): No  . Lack of Transportation (Non-Medical): No  Physical Activity: Sufficiently Active  . Days of Exercise per Week: 6 days  . Minutes of Exercise per Session: 60 min  Stress: No Stress Concern Present  . Feeling of Stress : Not at all  Social Connections: Moderately Isolated  . Frequency of Communication with Friends and Family: More than three times a week  . Frequency of Social Gatherings with Friends and Family: More than three times a week  . Attends Religious  Services: 1 to 4 times per year  . Active Member of Clubs or Organizations: No  . Attends Archivist Meetings: Never  . Marital Status: Widowed  Intimate Partner Violence: Not At Risk  . Fear of Current or Ex-Partner: No  . Emotionally Abused: No  . Physically Abused: No  . Sexually Abused: No    FAMILY HISTORY: Family History  Problem Relation Age of Onset  . Dementia Mother   . Alzheimer's disease Mother   . Mental illness Mother        ALZ DISEASE  .  Cancer Brother 72       pancreatic cancer    ALLERGIES:  is allergic to sulfonamide derivatives.  MEDICATIONS:  Current Outpatient Medications  Medication Sig Dispense Refill  . b complex vitamins tablet Take 1 tablet by mouth daily.    . Calcium Carbonate-Vitamin D 600-400 MG-UNIT tablet Take 1 tablet by mouth daily.    . clorazepate (TRANXENE) 15 MG tablet 1 po qhs prn 30 tablet 1  . cromolyn (NASALCROM) 5.2 MG/ACT nasal spray Place 2 sprays into the nose 2 (two) times daily.    [name] doxazosin (CARDURA) 4 MG tablet Take 1 tablet (4 mg total) by mouth at bedtime. 90 tablet 1  . ELIQUIS 5 MG TABS tablet TAKE 1 TABLET BY MOUTH TWICE A DAY 180 tablet 1  . fenofibrate 160 MG tablet Take 1 tablet (160 mg total) by mouth daily. 90 tablet 1  . fish oil-omega-3 fatty acids 1000 MG capsule Take 4 g by mouth daily.    . hydrochlorothiazide (MICROZIDE) 12.5 MG capsule TAKE 1 CAPSULE (12.5 MG TOTAL) BY MOUTH AS NEEDED (SWELLING). 90 capsule 1  . lisinopril (ZESTRIL) 20 MG tablet TAKE 1 TABLET BY MOUTH EVERY DAY 90 tablet 3  . metoprolol (TOPROL-XL) 200 MG 24 hr tablet Take 0.5 tablets (100 mg total) by mouth daily. 45 tablet 3  . Multiple Vitamins-Minerals (MULTIVITAMIN PO) Take 1 tablet by mouth daily.    . Multiple Vitamins-Minerals (PRESERVISION AREDS 2) CAPS 2 po qd (Patient taking differently: Take 2 capsules by mouth daily.)    . simvastatin (ZOCOR) 20 MG tablet Take 1 tablet (20 mg total) by mouth at bedtime. 90 tablet 1  . vitamin C (ASCORBIC ACID) 500 MG tablet Take 500 mg by mouth daily.     No current facility-administered medications for this visit.    REVIEW OF SYSTEMS:   Constitutional: Denies fevers, chills or abnormal night sweats Eyes: Denies blurriness of vision, double vision or watery eyes Ears, nose, mouth, throat, and face: Denies mucositis or sore throat Respiratory: Denies cough, dyspnea or wheezes Cardiovascular: Denies palpitation, chest discomfort or lower extremity  swelling Gastrointestinal:  Denies nausea, heartburn or change in bowel habits Skin: Denies abnormal skin rashes Lymphatics: Denies new lymphadenopathy or easy bruising Neurological:Denies numbness, tingling or new weaknesses Behavioral/Psych: Mood is stable, no new changes  All other systems were reviewed with the patient and are negative.  PHYSICAL EXAMINATION: ECOG PERFORMANCE STATUS: 0 - Asymptomatic  Vitals:   03/30/21 1309  BP: (!) 166/48  Pulse: (!) 59  Resp: 18  Temp: (!) 97.4 F (36.3 C)  SpO2: 100%   Filed Weights   03/30/21 1309  Weight: 161 lb (73 kg)    GENERAL:alert, no distress and comfortable SKIN: skin color, texture, turgor are normal, no rashes or significant lesions EYES: normal, conjunctiva are pink and non-injected, sclera clear OROPHARYNX:no exudate, no erythema and lips, buccal mucosa, and tongue normal  NECK: supple, thyroid normal size, non-tender, without nodularity LYMPH:  no palpable lymphadenopathy in the cervical, axillary or inguinal LUNGS: clear to auscultation and percussion with normal breathing effort HEART: regular rate & rhythm and no murmurs and no lower extremity edema ABDOMEN:abdomen soft, non-tender and normal bowel sounds Musculoskeletal:no cyanosis of digits and no clubbing  PSYCH: alert & oriented x 3 with fluent speech NEURO: no focal motor/sensory deficits  LABORATORY DATA:  I have reviewed the data as listed CBC EXTENDED Latest Ref Rng & Units 03/30/2021 03/23/2021 02/07/2021  WBC 4.0 - 10.5 K/uL 6.4 5.7 7.4  RBC 4.22 - 5.81 MIL/uL 4.25 4.20(L) 4.04(L)  HGB 13.0 - 17.0 g/dL 13.7 13.5 13.2  HCT 39.0 - 52.0 % 40.1 40.1 38.7(L)  PLT 150 - 400 K/uL 144(L) 142.0(L) 143.0(L)  NEUTROABS 1.7 - 7.7 K/uL PENDING 2.3 3.6  LYMPHSABS 0.7 - 4.0 K/uL PENDING 2.3 2.5     RADIOGRAPHIC STUDIES: I have personally reviewed the radiological images as listed and agreed with the findings in the report. DG Lumbar Spine Complete  Result Date:  03/29/2021 CLINICAL DATA:  Low back pain radiating into the right thigh No known injury EXAM: LUMBAR SPINE - COMPLETE 4+ VIEW COMPARISON:  01/21/2013 FINDINGS: Levoconvex curvature of the lumbar spine. Minimal retrolisthesis of L2 on L3 and L1-L2. Vertebral body heights are maintained. Mild disc height loss seen throughout the lumbar spine, greatest at L2-L3. Mild to moderate facet degenerative changes seen throughout the lumbar spine, greatest at L5-S1. Atherosclerotic changes seen throughout visualized arterial segments. IMPRESSION: Multilevel degenerative changes of the lumbar spine as discussed above. Electronically Signed   By: Miachel Roux M.D.   On: 03/29/2021 07:54   DG Hip Unilat W OR W/O Pelvis 2-3 Views Right  Result Date: 03/29/2021 CLINICAL DATA:  Right hip and low back pain for 1 week with no known injury. EXAM: DG HIP (WITH OR WITHOUT PELVIS) 2-3V RIGHT COMPARISON:  None. FINDINGS: Radiation seeds noted in the region of the prostate. Mild joint space loss and spurring of both hips. No fractures or dislocations. IMPRESSION: Mild right hip osteoarthrosis. Electronically Signed   By: Miachel Roux M.D.   On: 03/29/2021 07:52    ASSESSMENT & PLAN Thrombocytopenia (HCC) The cause of the thrombocytopenia could be multifactorial We will rule out platelet clumping I will order additional studies  There is no contraindication to remain on antiplatelet agents or anticoagulants as long as the platelet is greater than 50,000.    PROSTATE CANCER, HX OF According to the patient, his last PSA check was 0.93 He recall having a check around January and this is a stable number for him I would defer to his urologist for management  Monocytosis On his outside differential, he was noted to have slight monocytosis He denies active infection We will order peripheral smear for evaluation I will see him tomorrow to review all test results and to determine the next step   Orders Placed This Encounter   Procedures  . CBC with Differential/Platelet    Standing Status:   Future    Number of Occurrences:   1    Standing Expiration Date:   03/30/2022  . Sedimentation rate    Standing Status:   Future    Number of Occurrences:   1    Standing Expiration Date:   03/30/2022  . Vitamin B12    Standing Status:   Future    Number of Occurrences:   1    Standing Expiration Date:  03/30/2022  . HIV antibody (with reflex)    Standing Status:   Future    Number of Occurrences:   1    Standing Expiration Date:   03/30/2022  . Hepatitis C antibody    Standing Status:   Future    Number of Occurrences:   1    Standing Expiration Date:   03/30/2022  . Save Smear (SS[facility])    Standing Status:   Future    Standing Expiration Date:   03/30/2022   All questions were answered. The patient knows to call the clinic with any problems, questions or concerns. No barriers to learning was detected. The total time spent in the appointment was 55 minutes encounter with patients including review of chart and various tests results, discussions about plan of care and coordination of care plan  Heath Lark, MD 03/30/2021 1:55 PM

## 2021-03-30 NOTE — Assessment & Plan Note (Signed)
The cause of the thrombocytopenia could be multifactorial We will rule out platelet clumping I will order additional studies  There is no contraindication to remain on antiplatelet agents or anticoagulants as long as the platelet is greater than 50,000.

## 2021-03-30 NOTE — Assessment & Plan Note (Signed)
According to the patient, his last PSA check was 0.93 He recall having a check around January and this is a stable number for him I would defer to his urologist for management

## 2021-03-30 NOTE — Assessment & Plan Note (Signed)
On his outside differential, he was noted to have slight monocytosis He denies active infection We will order peripheral smear for evaluation I will see him tomorrow to review all test results and to determine the next step

## 2021-03-31 ENCOUNTER — Encounter: Payer: Self-pay | Admitting: Hematology and Oncology

## 2021-03-31 ENCOUNTER — Inpatient Hospital Stay (HOSPITAL_BASED_OUTPATIENT_CLINIC_OR_DEPARTMENT_OTHER): Payer: Medicare Other | Admitting: Hematology and Oncology

## 2021-03-31 DIAGNOSIS — D696 Thrombocytopenia, unspecified: Secondary | ICD-10-CM | POA: Diagnosis not present

## 2021-03-31 DIAGNOSIS — D72821 Monocytosis (symptomatic): Secondary | ICD-10-CM | POA: Diagnosis not present

## 2021-03-31 DIAGNOSIS — M1611 Unilateral primary osteoarthritis, right hip: Secondary | ICD-10-CM | POA: Diagnosis not present

## 2021-03-31 DIAGNOSIS — I10 Essential (primary) hypertension: Secondary | ICD-10-CM | POA: Diagnosis not present

## 2021-03-31 DIAGNOSIS — E785 Hyperlipidemia, unspecified: Secondary | ICD-10-CM | POA: Diagnosis not present

## 2021-03-31 DIAGNOSIS — M545 Low back pain, unspecified: Secondary | ICD-10-CM | POA: Diagnosis not present

## 2021-03-31 NOTE — Assessment & Plan Note (Signed)
Careful review of his peripheral blood smear did not reveal any abnormal looking WBC His differential came back that his monocyte count is not high He does not need long-term follow-up

## 2021-03-31 NOTE — Assessment & Plan Note (Signed)
Common work-up for thrombocytopenia including inflammatory condition, hepatitis C, HIV or B12 deficiency were ruled out When I reviewed his peripheral blood smear, I noticed occasional platelet clumping I told the patient this could be related to slight dehydration The patient admits he typically do not drink enough fluid when he shows up for appointments because he does not like to look for bathrooms for frequent urination At this point in time, I reassured the patient this is benign He does not need long-term follow-up

## 2021-03-31 NOTE — Progress Notes (Signed)
[place] OFFICE PROGRESS NOTE  [name], [name], DO  ASSESSMENT & PLAN:  Thrombocytopenia ([place]) Common work-up for thrombocytopenia including inflammatory condition, hepatitis C, HIV or B12 deficiency were ruled out When I reviewed his peripheral blood smear, I noticed occasional platelet clumping I told the patient this could be related to slight dehydration The patient admits he typically do not drink enough fluid when he shows up for appointments because he does not like to look for bathrooms for frequent urination At this point in time, I reassured the patient this is benign He does not need long-term follow-up  Monocytosis Careful review of his peripheral blood smear did not reveal any abnormal looking WBC His differential came back that his monocyte count is not high He does not need long-term follow-up   No orders of the defined types were placed in this encounter.   The total time spent in the appointment was 20 minutes encounter with patients including review of chart and various tests results, discussions about plan of care and coordination of care plan   All questions were answered. The patient knows to call the clinic with any problems, questions or concerns. No barriers to learning was detected.    Heath Lark, MD 4/7/20222:27 PM  INTERVAL HISTORY: Mark Stark. 87 y.o. male returns for further follow-up to review test results drawn yesterday He has no new symptoms since I saw him  SUMMARY OF HEMATOLOGIC HISTORY: Mark Stark. 87 y.o. male is here because of thrombocytopenia and abnormal changes in his CBC  He was found to have abnormal CBC from recent blood work from primary care doctor The patient have remote history of prostate cancer status post seed radiation According to the patient, he follows at [facility] urology on an annual basis and his last PSA was 0.93, drawn around January of this year He denies urinary symptoms He denies  recent bruising/bleeding, such as spontaneous epistaxis, hematuria, melena or hematochezia  The patient denies history of liver disease, exposure to heparin, history of cardiac murmur/prior cardiovascular surgery or recent new medications He denies prior blood or platelet transfusions He denies recent infection  I have reviewed the past medical history, past surgical history, social history and family history with the patient and they are unchanged from previous note.  ALLERGIES:  is allergic to sulfonamide derivatives.  MEDICATIONS:  Current Outpatient Medications  Medication Sig Dispense Refill  . b complex vitamins tablet Take 1 tablet by mouth daily.    . Calcium Carbonate-Vitamin D 600-400 MG-UNIT tablet Take 1 tablet by mouth daily.    . clorazepate (TRANXENE) 15 MG tablet 1 po qhs prn 30 tablet 1  . cromolyn (NASALCROM) 5.2 MG/ACT nasal spray Place 2 sprays into the nose 2 (two) times daily.    [name] doxazosin (CARDURA) 4 MG tablet Take 1 tablet (4 mg total) by mouth at bedtime. 90 tablet 1  . ELIQUIS 5 MG TABS tablet TAKE 1 TABLET BY MOUTH TWICE A DAY 180 tablet 1  . fenofibrate 160 MG tablet Take 1 tablet (160 mg total) by mouth daily. 90 tablet 1  . fish oil-omega-3 fatty acids 1000 MG capsule Take 4 g by mouth daily.    . hydrochlorothiazide (MICROZIDE) 12.5 MG capsule TAKE 1 CAPSULE (12.5 MG TOTAL) BY MOUTH AS NEEDED (SWELLING). 90 capsule 1  . lisinopril (ZESTRIL) 20 MG tablet TAKE 1 TABLET BY MOUTH EVERY DAY 90 tablet 3  . metoprolol (TOPROL-XL) 200 MG 24 hr tablet  Take 0.5 tablets (100 mg total) by mouth daily. 45 tablet 3  . Multiple Vitamins-Minerals (MULTIVITAMIN PO) Take 1 tablet by mouth daily.    . Multiple Vitamins-Minerals (PRESERVISION AREDS 2) CAPS 2 po qd (Patient taking differently: Take 2 capsules by mouth daily.)    . simvastatin (ZOCOR) 20 MG tablet Take 1 tablet (20 mg total) by mouth at bedtime. 90 tablet 1  . vitamin C (ASCORBIC ACID) 500 MG tablet Take 500 mg  by mouth daily.     No current facility-administered medications for this visit.     REVIEW OF SYSTEMS:   Constitutional: Denies fevers, chills or night sweats Eyes: Denies blurriness of vision Ears, nose, mouth, throat, and face: Denies mucositis or sore throat Respiratory: Denies cough, dyspnea or wheezes Cardiovascular: Denies palpitation, chest discomfort or lower extremity swelling Gastrointestinal:  Denies nausea, heartburn or change in bowel habits Skin: Denies abnormal skin rashes Lymphatics: Denies new lymphadenopathy or easy bruising Neurological:Denies numbness, tingling or new weaknesses Behavioral/Psych: Mood is stable, no new changes  All other systems were reviewed with the patient and are negative.  PHYSICAL EXAMINATION: ECOG PERFORMANCE STATUS: 0 - Asymptomatic  Vitals:   03/31/21 1354  BP: (!) 151/58  Pulse: 66  Resp: 18  Temp: 98.1 F (36.7 C)  SpO2: 93%   Filed Weights   03/31/21 1354  Weight: 160 lb 3.2 oz (72.7 kg)    GENERAL:alert, no distress and comfortable NEURO: alert & oriented x 3 with fluent speech, no focal motor/sensory deficits  LABORATORY DATA:  I have reviewed the data as listed I have reviewed his peripheral blood smear.  Noted occasional platelet clumping.  Morphology of red blood cell, white blood cells and platelets were normal. No schistocytes     Component Value Date/Time   NA 138 02/07/2021 1353   NA 135 07/18/2018 1016   K 4.5 02/07/2021 1353   CL 103 02/07/2021 1353   CO2 30 02/07/2021 1353   GLUCOSE 88 02/07/2021 1353   BUN 21 02/07/2021 1353   BUN 20 07/18/2018 1016   CREATININE 1.20 02/07/2021 1353   CREATININE 1.19 (H) 01/20/2019 0953   CALCIUM 8.8 02/07/2021 1353   PROT 6.0 02/07/2021 1353   ALBUMIN 3.9 02/07/2021 1353   AST 25 02/07/2021 1353   ALT 19 02/07/2021 1353   ALKPHOS 30 (L) 02/07/2021 1353   BILITOT 0.8 02/07/2021 1353   GFRNONAA 54 (L) 01/20/2019 0953   GFRAA 63 01/20/2019 0953    No results  found for: SPEP, UPEP  Lab Results  Component Value Date   WBC 6.4 03/30/2021   NEUTROABS 3.1 03/30/2021   HGB 13.7 03/30/2021   HCT 40.1 03/30/2021   MCV 94.4 03/30/2021   PLT 144 (L) 03/30/2021      Chemistry      Component Value Date/Time   NA 138 02/07/2021 1353   NA 135 07/18/2018 1016   K 4.5 02/07/2021 1353   CL 103 02/07/2021 1353   CO2 30 02/07/2021 1353   BUN 21 02/07/2021 1353   BUN 20 07/18/2018 1016   CREATININE 1.20 02/07/2021 1353   CREATININE 1.19 (H) 01/20/2019 0953      Component Value Date/Time   CALCIUM 8.8 02/07/2021 1353   ALKPHOS 30 (L) 02/07/2021 1353   AST 25 02/07/2021 1353   ALT 19 02/07/2021 1353   BILITOT 0.8 02/07/2021 1353

## 2021-04-01 ENCOUNTER — Other Ambulatory Visit: Payer: Self-pay

## 2021-04-01 ENCOUNTER — Encounter: Payer: Self-pay | Admitting: Cardiovascular Disease

## 2021-04-01 ENCOUNTER — Ambulatory Visit (INDEPENDENT_AMBULATORY_CARE_PROVIDER_SITE_OTHER): Payer: Medicare Other | Admitting: Cardiovascular Disease

## 2021-04-01 VITALS — BP 104/72 | HR 59 | Ht 68.0 in | Wt 159.0 lb

## 2021-04-01 DIAGNOSIS — E782 Mixed hyperlipidemia: Secondary | ICD-10-CM

## 2021-04-01 DIAGNOSIS — I10 Essential (primary) hypertension: Secondary | ICD-10-CM | POA: Diagnosis not present

## 2021-04-01 DIAGNOSIS — Z7901 Long term (current) use of anticoagulants: Secondary | ICD-10-CM | POA: Diagnosis not present

## 2021-04-01 DIAGNOSIS — I440 Atrioventricular block, first degree: Secondary | ICD-10-CM | POA: Diagnosis not present

## 2021-04-01 DIAGNOSIS — I480 Paroxysmal atrial fibrillation: Secondary | ICD-10-CM

## 2021-04-01 NOTE — Progress Notes (Signed)
Cardiology Office Note    Date:  04/03/2021   ID:  Mark Stark., DOB 11-12-1928, MRN [id number]  PCP:  [name], DO  Cardiologist:  Shelva Majestic, MD   14 month F/U cardiology evaluation, initally referred through the courtesy of Dr. Roma Schanz for evaluation of an irregular rhythm/atrial fibrillation.  History of Present Illness:  Mark Stark. is a 87 y.o. male who was referred by Dr. [name] for atrial fibrillation.  I saw him as an add-on on 01/24/2018.  He has been seen on several occasions since and was last seen in February 2021.  He presents for a follow-up evaluation.  Mark Stark is a young appearing 87 year old male who remotely had seen Dr. Percival Spanish on one occasion in 2012.  He was noted have a mild slight heart murmur.  Over the last several years he has continued to be active.  He typically walks for 1 hour every morning.  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  On Christmas Eve 2018, he developed a sensation of an irregular rhythm.  His heart rhythm has persisted.  He had seen Dr. Roma Schanz and was found to be in atrial fibrillation.  He was started on eliquis 5 mg twice a day for anticoagulation.  He has been taking metoprolol succinate 100 mg daily, lisinopril 5 mg, and doxazocin 4 mg for hypertension.  He is on simvastatin 20 mg daily for hyperlipidemia.  When I saw him, he had just started to wear a Holter monitor.   His initial strips confirmed persistent atrial fibrillation.  A Pre[employer] service read out from 01/24/2018 at 450:54 AM has revealed a 3.1 second pause while the patient was sleeping. [name]  He denied any chest pain, pesyncope, PND, orthopnea.  He had experience mild fatigue and slight shortness of breath.  He denied issues with sleep.    He was started on eliquis for anticoagulation  on January 06, 2018.  He underwent a 2-D echo Doppler study on 01/21/2018 which showed an EF of 55-60%.  Ascending aorta  was upper normal at 40 mm.  He had moderate mitral regurgitation.  His left atrium measured 55 mm was interpreted as severe dilation.  LA volume was 56 mL's per meter squared. When I last saw himon 02/22/2018, he was maintaining atrial fibrillation and had been on anticoagulatio for 6 weeks.  He underwent successful DC cardioversion on 03/01/2018 and was successfully converted to sinus rhythm with 120 J synchronized countershock.  I saw him in follow-up on March 15, 2018, he was maintaining sinus rhythm and was without recurrent awareness of arrhythmia.  His ECG showed sinus bradycardia at 54 bpm with first-degree AV block.    When I  saw him in June 2019 he was remaining stable and was unaware of any recurrent atrial fibrillation.  He continues to be active working in a large garden and also pushing a Therapist, art.  Over the past several months, he has been on a regimen consisting of lisinopril 15 mg, metoprolol 100 mg daily, doxazosin 4 mg at bedtime.  He has been on fenofibrate 160 mg and simvastatin 20 mg for hyperlipidemia.  He has continued to be on Eliquis 5 mg twice a day.  He had sent me a letter last month which stated that his blood pressures at home were ranging between 116 and 148 with a median of 133.  His pulse was 59 and diastolic pressures  were in the 60s to 61s.  He continues to be doing well and denies recurrent palpitations or awareness of atrial fibrillation.    When saw him in October 2019  I further titrated his lisinopril to 20 mg daily.  Over the past several months he has now been maintained on lisinopril 20 mg daily, Toprol-XL 100 mg, and Cardura 4 mg at bedtime.  He is very compulsive with his blood pressure readings.  Over this 47-monthperiod he had taken 79 blood pressure readings over 51 days mean blood pressure is 139/70 with a heart rate of 58 with his range systolic 1 217-958 and diastolic 615-05with heart rates between 50 and 70.  He was concerned that his blood pressure is  still elevated.   I saw him in February 2020.  He has been on lisinopril 20 mg daily, Toprol-XL 100 mg, and doxazosin 4 mg at bedtime for hypertension.  Since his last office visit he had 222 blood pressure readings from February 26 through August 18, 2019.  His mean blood pressure of the 222 readings is 1697/94with a systolic range of 1801to 1655and diastolic range of 56 to 86.  His heart rate ranges 52 to 87.  At times he has begun to notice ankle swelling right greater than left for the past 6 weeks.  He had undergone follow-up laboratory with his primary physician on July 17, 2019.  Creatinine was 1.05.  Potassium was 4.2.  He feels well.  He remains active.  He works in the yard and garden.  He does aerobic activity.  He denies chest tightness or pressure.    I last saw him in February 2021 and since his prior evaluation  in August 2020, Mr. ZMaltesehas remained stable.  He has continued to take his blood pressure regularly and typically is taken this in the early morning.  He brought with him data regarding 48 blood pressure readings from December 27, 2019 through February 18, 2020 on lisinopril 20 mg, Cardura 4 mg, and Toprol-XL 100 mg.  His average daily blood pressure in the morning before taking his medication was 139 with a range of 1374-827with diastolic blood pressures averaging 68.2 with a range of 61-75.  His heart rate averages 55.9 with a range of 51-63.  Presently he feels well.  He denies chest pain.  He denies any awareness of recurrent arrhythmia.  At times he admits to trivial ankle edema.  He has received  Covid vaccination.   Over the past year, Mr. ZKurtcontinues to feel well.  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  However he remains active and every morning he does 1 hour of aerobic walking.  He works in the garden and long.  He denies chest pain or palpitations.  He is unaware of any recurrent atrial fibrillation.  He monitors his blood pressure closely.  He  brought with him data from December 25, 2020 through March 31, 2021 on his current regimen consisting of lisinopril 20 mg, Cardura 4 mg, and metoprolol succinate 100 mg.  His average systolic blood pressure is 140 with a range of 1 27-1 56 and his average diastolic blood pressure is 68 with a range of 53-84.  His average heart rate is 60 with a range of 53-77.  These encompass 97 readings.  He also had comparative data with his measurements during the same time of the preceding year.  He presents for evaluation  Past Medical History:  Diagnosis Date  . Anxiety   . Diverticulitis   . Dyslipidemia   . Hypertension   . 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 Prostate cancer [facility])     Past Surgical History:  Procedure Laterality Date  . CARDIOVERSION N/A 03/01/2018   Procedure: CARDIOVERSION;  Surgeon: Troy Sine, MD;  Location: [facility] ENDOSCOPY;  Service: Cardiovascular;  Laterality: N/A;  . CATARACT EXTRACTION  01/27/2010   right  . CATARACT EXTRACTION  03/04/2010   left  . Prostate seed implants    . PROSTATE SURGERY      Current Medications: Outpatient Medications Prior to Visit  Medication Sig Dispense Refill  . b complex vitamins tablet Take 1 tablet by mouth daily.    . Calcium Carbonate-Vitamin D 600-400 MG-UNIT tablet Take 1 tablet by mouth daily.    . clorazepate (TRANXENE) 15 MG tablet 1 po qhs prn 30 tablet 1  . cromolyn (NASALCROM) 5.2 MG/ACT nasal spray Place 2 sprays into the nose 2 (two) times daily.    [name] doxazosin (CARDURA) 4 MG tablet Take 1 tablet (4 mg total) by mouth at bedtime. 90 tablet 1  . ELIQUIS 5 MG TABS tablet TAKE 1 TABLET BY MOUTH TWICE A DAY 180 tablet 1  . fenofibrate 160 MG tablet Take 1 tablet (160 mg total) by mouth daily. 90 tablet 1  . fish oil-omega-3 fatty acids 1000 MG capsule Take 4 g by mouth daily.    [name] lisinopril (ZESTRIL) 20 MG tablet TAKE 1 TABLET BY MOUTH EVERY DAY 90 tablet 3  . metoprolol (TOPROL-XL) 200 MG 24 hr tablet Take 0.5  tablets (100 mg total) by mouth daily. 45 tablet 3  . Multiple Vitamins-Minerals (MULTIVITAMIN PO) Take 1 tablet by mouth daily.    . Multiple Vitamins-Minerals (PRESERVISION AREDS 2) CAPS 2 po qd (Patient taking differently: Take 2 capsules by mouth daily.)    . simvastatin (ZOCOR) 20 MG tablet Take 1 tablet (20 mg total) by mouth at bedtime. 90 tablet 1  . vitamin C (ASCORBIC ACID) 500 MG tablet Take 500 mg by mouth daily.    . hydrochlorothiazide (MICROZIDE) 12.5 MG capsule TAKE 1 CAPSULE (12.5 MG TOTAL) BY MOUTH AS NEEDED (SWELLING). 90 capsule 1   No facility-administered medications prior to visit.     Allergies:   Sulfonamide derivatives   Social History   Socioeconomic History  . Marital status: Widowed    Spouse name: Not on file  . Number of children: Not on file  . Years of education: Not on file  . Highest education level: Not on file  Occupational History  . Occupation: Professor---RETIRED    Employer: RETIRED    Comment: Retired  Tobacco Use  . Smoking status: Never Smoker  . Smokeless tobacco: Never Used  Vaping Use  . Vaping Use: Never used  Substance and Sexual Activity  . Alcohol use: No    Comment: Little wine occasionally   . Drug use: No  . Sexual activity: Never    Partners: Female  Other Topics Concern  . Not on file  Social History Narrative  . Not on file   Social Determinants of Health   Financial Resource Strain: Low Risk   . Difficulty of Paying Living Expenses: Not hard at all  Food Insecurity: No Food Insecurity  . Worried About Charity fundraiser in the Last Year: Never true  . Ran Out of Food in the Last Year: Never true  Transportation  Needs: No Transportation Needs  . Lack of Transportation (Medical): No  . Lack of Transportation (Non-Medical): No  Physical Activity: Sufficiently Active  . Days of Exercise per Week: 6 days  . Minutes of Exercise per Session: 60 min  Stress: No Stress Concern Present  . Feeling of Stress : Not at  all  Social Connections: Moderately Isolated  . Frequency of Communication with Friends and Family: More than three times a week  . Frequency of Social Gatherings with Friends and Family: More than three times a week  . Attends Religious Services: 1 to 4 times per year  . Active Member of Clubs or Organizations: No  . Attends Archivist Meetings: Never  . Marital Status: Widowed     Family History:  The patient's family history includes Alzheimer's disease in his mother; Cancer (age of onset: 48) in his brother; Dementia in his mother; Mental illness in his mother.  His father is deceased and had suffered a myocardial infarction and had 3 CVAs.  His mother died and had Alzheimer's disease.  ROS General: Negative; No fevers, chills, or night sweats;  HEENT: Negative; No changes in vision or hearing, sinus congestion, difficulty swallowing Pulmonary: Negative; No cough, wheezing, shortness of breath, hemoptysis Cardiovascular: See history of present illness GI: Negative; No nausea, vomiting, diarrhea, or abdominal pain GU: Negative; No dysuria, hematuria, or difficulty voiding Musculoskeletal: Occasional back discomfort Hematologic/Oncology: Negative; no easy bruising, bleeding Endocrine: Negative; no heat/cold intolerance; no diabetes Neuro: Negative; no changes in balance, headaches Skin: Negative; No rashes or skin lesions Psychiatric: Negative; No behavioral problems, depression Sleep: Negative; No snoring, daytime sleepiness, hypersomnolence, bruxism, restless legs, hypnogognic hallucinations, no cataplexy Other comprehensive 14 point system review is negative.   PHYSICAL EXAM:   VS:  BP 104/72 (BP Location: Left Arm, Patient Position: Sitting, Cuff Size: Normal)   Pulse (!) 59   Ht 5' 8"  (1.727 m)   Wt 159 lb (72.1 kg)   BMI 24.18 kg/m     Repeat blood pressure by me 116/78 supine and 118/78 standing  Wt Readings from Last 3 Encounters:  04/01/21 159 lb (72.1  kg)  03/31/21 160 lb 3.2 oz (72.7 kg)  03/30/21 161 lb (73 kg)    General: Alert, oriented, no distress.  Skin: normal turgor, no rashes, warm and dry HEENT: Normocephalic, atraumatic. Pupils equal round and reactive to light; sclera anicteric; extraocular muscles intact;  Nose without nasal septal hypertrophy Mouth/Parynx benign; Mallinpatti scale 3 Neck: No JVD, no carotid bruits; normal carotid upstroke Lungs: clear to ausculatation and percussion; no wheezing or rales Chest wall: without tenderness to palpitation Heart: PMI not displaced, RRR, s1 s2 normal, 1/6 systolic murmur, no diastolic murmur, no rubs, gallops, thrills, or heaves Abdomen: soft, nontender; no hepatosplenomehaly, BS+; abdominal aorta nontender and not dilated by palpation. Back: no CVA tenderness Pulses 2+ Musculoskeletal: full range of motion, normal strength, no joint deformities Extremities: no clubbing cyanosis or edema, Homan's sign negative  Neurologic: grossly nonfocal; Cranial nerves grossly wnl Psychologic: Normal mood and affect   Studies/Labs Reviewed:   ECG (independently read by me): Sinus bradycardia at 59; 1st degree AV block; PR 238 msec    February 2021 ECG (independently read by me): Sinus bradycardia 55 bpm, first-degree AV block PR interval 250 ms.  No ectopy  August 2020 ECG (independently read by me): Sinus bradycardia 57 bpm.  First-degree AV block with PR 242 ms.  QTc interval 395 ms.  No ST T changes.  Early transition.  February 18, 2019 ECG (independently read by me):Sinus bradycardia at 56 bpm.  First-degree AV block.  Early transition..  PR 238 ms.  October 21, 2018 ECG (independently read by me): Sinus bradycardia 56 bpm; first-degree AV block with a PR interval of 236 ms.  No ectopy.  No ST segment changes.  March 15, 2018 ECG (independently read by me): sinus bradycardia 54 bpm.  First degree AV block with PR interval at 246 bili seconds.  No ectopy.  Normal QTc  interval.  02/22/2018 EKG:  EKG is ordered today.  Atrial fibrillation at 81 bpm.  QTc interval 397 ms.  01/24/2018 ECG (independently read by me): atrial fibrillation at 73 bpm. QTc interval 387 ms.  No significant ST-T changes.  Recent Labs: BMP Latest Ref Rng & Units 02/07/2021 07/26/2020 01/22/2020  Glucose 70 - 99 mg/dL 88 86 85  BUN 6 - 23 mg/dL 21 21 24(H)  Creatinine 0.40 - 1.50 mg/dL 1.20 1.08 1.15  BUN/Creat Ratio 6 - 22 (calc) - - -  Sodium 135 - 145 mEq/L 138 136 136  Potassium 3.5 - 5.1 mEq/L 4.5 4.4 4.1  Chloride 96 - 112 mEq/L 103 103 100  CO2 19 - 32 mEq/L 30 29 30   Calcium 8.4 - 10.5 mg/dL 8.8 9.0 8.9     Hepatic Function Latest Ref Rng & Units 02/07/2021 07/26/2020 01/22/2020  Total Protein 6.0 - 8.3 g/dL 6.0 5.9(L) 6.2  Albumin 3.5 - 5.2 g/dL 3.9 4.0 4.1  AST 0 - 37 U/L 25 28 29   ALT 0 - 53 U/L 19 19 19   Alk Phosphatase 39 - 117 U/L 30(L) 30(L) 30(L)  Total Bilirubin 0.2 - 1.2 mg/dL 0.8 0.9 0.8  Bilirubin, Direct 0.0 - 0.3 mg/dL - - -    CBC Latest Ref Rng & Units 03/30/2021 03/23/2021 02/07/2021  WBC 4.0 - 10.5 K/uL 6.4 5.7 7.4  Hemoglobin 13.0 - 17.0 g/dL 13.7 13.5 13.2  Hematocrit 39.0 - 52.0 % 40.1 40.1 38.7(L)  Platelets 150 - 400 K/uL 144(L) 142.0(L) 143.0(L)   Lab Results  Component Value Date   MCV 94.4 03/30/2021   MCV 95.5 03/23/2021   MCV 95.8 02/07/2021   Lab Results  Component Value Date   TSH 1.830 01/25/2018   Lab Results  Component Value Date   HGBA1C 5.0 05/21/2012     BNP No results found for: BNP  ProBNP No results found for: PROBNP   Lipid Panel     Component Value Date/Time   CHOL 98 02/07/2021 1353   TRIG 69.0 02/07/2021 1353   HDL 44.80 02/07/2021 1353   CHOLHDL 2 02/07/2021 1353   VLDL 13.8 02/07/2021 1353   LDLCALC 39 02/07/2021 1353   LDLCALC 32 01/11/2018 1454     RADIOLOGY: DG Lumbar Spine Complete  Result Date: 03/29/2021 CLINICAL DATA:  Low back pain radiating into the right thigh No known injury EXAM:  LUMBAR SPINE - COMPLETE 4+ VIEW COMPARISON:  01/21/2013 FINDINGS: Levoconvex curvature of the lumbar spine. Minimal retrolisthesis of L2 on L3 and L1-L2. Vertebral body heights are maintained. Mild disc height loss seen throughout the lumbar spine, greatest at L2-L3. Mild to moderate facet degenerative changes seen throughout the lumbar spine, greatest at L5-S1. Atherosclerotic changes seen throughout visualized arterial segments. IMPRESSION: Multilevel degenerative changes of the lumbar spine as discussed above. Electronically Signed   By: Miachel Roux M.D.   On: 03/29/2021 07:54   DG Hip Unilat W OR W/O Pelvis 2-3 Views Right  Result Date: 03/29/2021 CLINICAL DATA:  Right hip and low back pain for 1 week with no known injury. EXAM: DG HIP (WITH OR WITHOUT PELVIS) 2-3V RIGHT COMPARISON:  None. FINDINGS: Radiation seeds noted in the region of the prostate. Mild joint space loss and spurring of both hips. No fractures or dislocations. IMPRESSION: Mild right hip osteoarthrosis. Electronically Signed   By: Miachel Roux M.D.   On: 03/29/2021 07:52     Additional studies/ records that were reviewed today include:  I reviewed the prior evaluation in 2012.  I reviewed the primary care evaluation from [facility]. I reviewed the Holter monitor. ------------------------------------------------------------------- 01/21/2018  Echo Study Conclusions  - Left ventricle: The cavity size was normal. There was moderate   concentric hypertrophy. Systolic function was normal. The   estimated ejection fraction was in the range of 55% to 60%. Wall   motion was normal; there were no regional wall motion   abnormalities. The study was not technically sufficient to allow   evaluation of LV diastolic dysfunction due to atrial   fibrillation. - Aortic valve: Valve mobility was restricted. There was moderate   regurgitation. - Ascending aorta: The ascending aorta was upper normal size   measuring 40 mm. - Mitral  valve: There was moderate regurgitation. - Left atrium: The atrium was severely dilated. - Right ventricle: The cavity size was normal. Wall thickness was   normal. Systolic function was normal. - Right atrium: The atrium was mildly dilated. - Tricuspid valve: There was moderate regurgitation. - Pulmonic valve: There was trivial regurgitation. - Pulmonary arteries: Systolic pressure was mildly increased. PA   peak pressure: 38 mm Hg (S). - Inferior vena cava: The vessel was normal in size. - Pericardium, extracardiac: There was no pericardial effusion.   ASSESSMENT:    1. Essential hypertension   2. Paroxysmal atrial fibrillation (HCC)   3. Anticoagulation adequate   4. Mixed hyperlipidemia   5. First degree AV block     PLAN:  MarkSuen is a young appearing, active 87 year old gentleman who has a history of hypertension, hyperlipidemia, and developed new onset irregular heart rhythm on Christmas Eve 2018.  He was started on eliquis anticoagulation after seeing Dr. [name] and has been on therapy since 01/06/2018.  An  echo Doppler study demonstrated  preserved LV function.   However, he had a dilated left atrium.  In 2012, his left atrial dimension was increased at 49 mm but this had further increased on his  echo in January 2019 at 55 mm. He underwent successful outpatient DC cardioversion and has been maintaining sinus rhythm since his procedure on 03/01/2018.  Fortunately, since his cardioversion he is maintaining sinus rhythm.  He has first-degree AV block which is stable.  His most recent ECG shows sinus bradycardia 59 bpm which is consistent with his average heart rate at home of 60.  His blood pressure today is stable without orthostatic change.  He continues to be on doxazosin 4 mg at bedtime, lisinopril 20 mg daily, and metoprolol succinate 100 mg daily.  He has a prescription for HCTZ for swelling which he rarely has to take.  He continues to be on simvastatin, fenofibrate, and  omega-3 fatty acid for hyperlipidemia LDL cholesterol on February 07, 2021 was excellent at 39 and triglycerides 69.  He continues to be on Eliquis anticoagulation and denies any bleeding.  He is doing well.  He will continue with his current medical regimen.  As long as he remains stable I  will see him in 1 year for reevaluation.    Medication Adjustments/Labs and Tests Ordered: Current medicines are reviewed at length with the patient today.  Concerns regarding medicines are outlined above.  Medication changes, Labs and Tests ordered today are listed in the Patient Instructions below. Patient Instructions  Medication Instructions:  The current medical regimen is effective;  continue present plan and medications.  *If you need a refill on your cardiac medications before your next appointment, please call your pharmacy*   Follow-Up: At [facility], you and your health needs are our priority.  As part of our continuing mission to provide you with exceptional heart care, we have created designated Provider Care Teams.  These Care Teams include your primary Cardiologist (physician) and Advanced Practice Providers (APPs -  Physician Assistants and Nurse Practitioners) who all work together to provide you with the care you need, when you need it.  We recommend signing up for the patient portal called "MyChart".  Sign up information is provided on this After Visit Summary.  MyChart is used to connect with patients for Virtual Visits (Telemedicine).  Patients are able to view lab/test results, encounter notes, upcoming appointments, etc.  Non-urgent messages can be sent to your provider as well.   To learn more about what you can do with MyChart, go to [email or web address].    Your next appointment:   12 month(s)  The format for your next appointment:   In Person  Provider:   Shelva Majestic, MD        Signed, Shelva Majestic, MD  04/03/2021 10:44 AM    [facility] [address] [place], [place], [place]  [address] Phone: [phone]

## 2021-04-01 NOTE — Patient Instructions (Signed)

## 2021-04-03 ENCOUNTER — Encounter: Payer: Self-pay | Admitting: Cardiovascular Disease

## 2021-04-05 ENCOUNTER — Ambulatory Visit (INDEPENDENT_AMBULATORY_CARE_PROVIDER_SITE_OTHER): Payer: Medicare Other

## 2021-04-05 DIAGNOSIS — I10 Essential (primary) hypertension: Secondary | ICD-10-CM

## 2021-04-05 DIAGNOSIS — E785 Hyperlipidemia, unspecified: Secondary | ICD-10-CM

## 2021-04-05 DIAGNOSIS — I480 Paroxysmal atrial fibrillation: Secondary | ICD-10-CM

## 2021-04-05 NOTE — Chronic Care Management (AMB) (Signed)
Chronic Care Management   CCM RN Visit Note  04/05/2021 Name: Mark Stark. MRN: [id number] DOB: September 20, 1928  Subjective: Mark Stark. is a 87 y.o. year old male who is a primary care patient of [name], DO. The care management team was consulted for assistance with disease management and care coordination needs.    Engaged with patient by telephone for initial visit in response to provider referral for case management and/or care coordination services.   Consent to Services:  The patient was given the following information about Chronic Care Management services today, agreed to services, and gave verbal consent: 1. CCM service includes personalized support from designated clinical staff supervised by the primary care provider, including individualized plan of care and coordination with other care providers 2. 24/7 contact phone numbers for assistance for urgent and routine care needs. 3. Service will only be billed when office clinical staff spend 20 minutes or more in a month to coordinate care. 4. Only one practitioner may furnish and bill the service in a calendar month. 5.The patient may stop CCM services at any time (effective at the end of the month) by phone call to the office staff. 6. The patient will be responsible for cost sharing (co-pay) of up to 20% of the service fee (after annual deductible is met). Patient agreed to services and consent obtained.  Patient agreed to services and verbal consent obtained.   Assessment: Review of patient past medical history, allergies, medications, health status, including review of consultants reports, laboratory and other test data, was performed as part of comprehensive evaluation and provision of chronic care management services.   SDOH (Social Determinants of Health) assessments and interventions performed:  Last completed 02/10/2021  CCM Care Plan  Allergies  Allergen Reactions  . Sulfonamide Derivatives Other (See  Comments)    Unknown    Outpatient Encounter Medications as of 04/05/2021  Medication Sig  . b complex vitamins tablet Take 1 tablet by mouth daily.  . Calcium Carbonate-Vitamin D 600-400 MG-UNIT tablet Take 1 tablet by mouth daily.  . clorazepate (TRANXENE) 15 MG tablet 1 po qhs prn  . cromolyn (NASALCROM) 5.2 MG/ACT nasal spray Place 2 sprays into the nose 2 (two) times daily.  [name] doxazosin (CARDURA) 4 MG tablet Take 1 tablet (4 mg total) by mouth at bedtime.  [name] ELIQUIS 5 MG TABS tablet TAKE 1 TABLET BY MOUTH TWICE A DAY  . fenofibrate 160 MG tablet Take 1 tablet (160 mg total) by mouth daily.  . fish oil-omega-3 fatty acids 1000 MG capsule Take 4 g by mouth daily.  [name] lisinopril (ZESTRIL) 20 MG tablet TAKE 1 TABLET BY MOUTH EVERY DAY  . metoprolol (TOPROL-XL) 200 MG 24 hr tablet Take 0.5 tablets (100 mg total) by mouth daily.  . Multiple Vitamins-Minerals (MULTIVITAMIN PO) Take 1 tablet by mouth daily.  . Multiple Vitamins-Minerals (PRESERVISION AREDS 2) CAPS 2 po qd (Patient taking differently: Take 2 capsules by mouth daily.)  . simvastatin (ZOCOR) 20 MG tablet Take 1 tablet (20 mg total) by mouth at bedtime.  . vitamin C (ASCORBIC ACID) 500 MG tablet Take 500 mg by mouth daily.  . hydrochlorothiazide (MICROZIDE) 12.5 MG capsule TAKE 1 CAPSULE (12.5 MG TOTAL) BY MOUTH AS NEEDED (SWELLING).   No facility-administered encounter medications on file as of 04/05/2021.    Patient Active Problem List   Diagnosis Date Noted  . Thrombocytopenia ([place]) 03/30/2021  . Screening for HIV (human immunodeficiency virus) 03/30/2021  .  Monocytosis 03/30/2021  . Abnormal platelets ([place]) 03/28/2021  . Hip pain 03/28/2021  . Persistent atrial fibrillation ([place])   . Paroxysmal atrial fibrillation ([place]) 01/12/2018  . Cystoid macular edema of right eye 11/29/2017  . Macular degeneration 12/21/2015  . 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 eye with active choroidal neovascularization ([place])  04/30/2014  . Myalgia and myositis 11/16/2013  . Epiretinal membrane 11/07/2012  . Posterior vitreous detachment 11/07/2012  . Status post intraocular lens implant 11/07/2012  . Fungal ear infection 04/09/2012  . Abnormal laboratory test 03/27/2011  . AI (aortic insufficiency) 03/27/2011  . SYSTOLIC MURMUR 69/45/0388  . EDEMA 12/05/2010  . ECCHYMOSES, SPONTANEOUS 11/28/2010  . CONTUSION, RIGHT THIGH 11/28/2010  . DECREASED HEARING, BILATERAL 09/23/2010  . Pain in limb 07/28/2009  . ANKLE SPRAIN, LEFT 07/28/2009  . CONTUSION, LOWER LEG, LEFT 07/28/2009  . DERMATITIS, CONTACT, NOS 10/02/2007  . Essential hypertension 05/07/2007  . PROSTATE CANCER, HX OF 03/13/2007  . Hyperlipidemia 01/15/2007  . Anxiety state 01/15/2007  . DIVERTICULITIS, HX OF 01/15/2007    Conditions to be addressed/monitored:Atrial Fibrillation, HTN and HLD  Care Plan : Quality of Life  Updates made by Luretha Rued, RN since 04/05/2021 12:00 AM    Problem: Quality of Life (General Plan of Care) Resolved 04/05/2021    Long-Range Goal: Quality of Life Maintained Completed 04/05/2021  Start Date: 04/05/2021  Expected End Date: 10/05/2021  This Visit's Progress: On track  Priority: High  Note:   Current Barriers:  . Lack of Long Term care plan to maintain quality of life. Client reports he is doing well for age 31 years. Client repots he continues to fast walk daily, he cuts his own grass and manages an active garden. Client is independent and continues to drive without difficulty. Client reports a history of macular degeneration, but states with treatment he has not had any worsening of vision and has no problems reading a newspaper or other media. Reports he has supportive son and daughter in law, and states they talk every day, but are not able to see each other often due to distance. Client denies anxiety at this time. Reports some grief over loss of his wife approximately 5 years ago, but states he stays busy  and counseling not needed. Declines social work referral. But is aware that service is available. Client reports recent diagnosis of arthritis of lower back and right hip, but states currently he is not in any pain. Client denies any falls. He would like to know if he can take time released tylenol if needed.  . Care Coordination needs related to Covid Booster: Client with question regarding if 2nd Covid Booster recommended by his provider.   Nurse Case Manager Clinical Goal(s):  . patient will attend all scheduled medical appointments: next primary care appointment scheduled for 07/28/21 . the patient will demonstrate ongoing self health care management ability as evidenced by continued independent care related to health care needs  Interventions:  . 1:1 collaboration with [name], [name], DO regarding development and update of comprehensive plan of care as evidenced by provider attestation and co-signature . Inter-disciplinary care team collaboration (see longitudinal plan of care) . Evaluation of current treatment plan related to chronic health conditions and patient's adherence to plan as established by provider. . Reviewed scheduled/upcoming provider appointments including:  . Discussed plans with patient for ongoing care management follow up and provided patient with direct contact information for care management team  Patient Goals/Self-Care Activities Patient will self  administer medications as prescribed Patient will attend all scheduled provider appointments Patient will call pharmacy for medication refills Patient will continue to perform ADL's independently Patient will continue to perform IADL's independently Patient will call provider office for new concerns or questions Patient will work with [facility] to address care coordination needs and will continue to work with the clinical team to address health care and disease management related needs.    Follow Up Plan: The patient has  been provided with contact information for the care management team and has been advised to call with any health related questions or concerns.  The care management team will reach out to the patient again over the next 45 days.           Evidence-based guidance:   Assess patient's thoughts about quality of life, goals and expectations, and dissatisfaction or desire to improve.   Identify issues of primary importance such as mental health, illness, exercise tolerance, pain, sexual function and intimacy, cognitive change, social isolation, finances and relationships.   Assess and monitor for signs/symptoms of psychosocial concerns, especially depression or ideations regarding harm to others or self; provide or refer for mental health services as needed.   Identify sensory issues that impact quality of life such as hearing loss, vision deficit; strategize ways to maintain or improve hearing, vision.   Promote access to services in the community to support independence such as support groups, home visiting programs, financial assistance, handicapped parking tags, durable medical equipment and emergency responder.   Promote activities to decrease social isolation such as group support or social, leisure and recreational activities, employment, use of social media; consider safety concerns about being out of home for activities.   Provide patient an opportunity to share by storytelling or a "life review" to give positive meaning to life and to assist with coping and negative experiences.   Encourage patient to tap into hope to improve sense of self.   Counsel based on prognosis and as early as possible about end-of-life and palliative care; consider referral to palliative care provider.   Advocate for the development of palliative care plan that may include avoidance of unnecessary testing and intervention, symptom control, discontinuation of medications, hospice and organ donation.    Counsel as early as possible those with life-limiting chronic disease about palliative care; consider referral to palliative care provider.   Advocate for the development of palliative care plan.   Notes:    Problem: Quality of Life   Priority: Medium    Long-Range Goal: Quality of Life Maintained   Start Date: 04/04/2021  Expected End Date: 10/05/2021  This Visit's Progress: On track  Note:   Current Barriers:  . Lack of Long Term care plan to maintain quality of life. Client reports he is doing well for age 48 years. Client reports he continues to fast walk daily, he cuts his own grass and manages an active garden. Client is independent and continues to drive without difficulty. Client reports a history of macular degeneration, but states with treatment he has not had any worsening of vision and has no problems reading a newspaper or other media. Reports he has supportive son and daughter in law, and states they talk every day, but are not able to see each other often due to distance. Client denies anxiety at this time. Reports some grief over loss of his wife approximately 5 years ago, but states he stays busy and states counseling or social work referral is not needed, Although,  he is aware that this service is available to him. Client reports recent diagnosis of arthritis of lower back and right hip, but states currently he is not in any pain. Client denies any falls. He would like to know if he can take time released tylenol if needed. . Care Coordination needs: Client would like to know if it is recommended for him to take a second his Covid booster and if so, how to go about getting it.  Nurse Case Manager Clinical Goal(s):  . patient will verbalize understanding of plan for continued self management of health . the patient will demonstrate ongoing self health care management ability as evidenced by attending provider visits, taking medications as prescribed, continuing activities that  encourage fulfullment in his life such as walking, gardening, outside activites.  Interventions:  . 1:1 collaboration with [name], [name], DO regarding development and update of comprehensive plan of care as evidenced by provider attestation and co-signature . Inter-disciplinary care team collaboration (see longitudinal plan of care) . Evaluation of current treatment plan related to chronic disease processes and patient's adherence to plan as established by provider. Nash Dimmer with primary care provider regarding patient's question about Covid vaccine about Covid Vaccine and use of time released tylenol. . Discussed plans with patient for ongoing care management follow up and provided patient with direct contact information for care management team . Reviewed upcoming provider appointment: 07/28/2021  Patient Goals/Self-Care Activities Patient will self administer medications as prescribed Patient will attend all scheduled provider appointments Patient will call pharmacy for medication refills Patient will continue to perform ADL's independently Patient will continue to perform IADL's independently Patient will call provider office for new concerns or questions Patient will work with [facility] to address care coordination needs and will continue to work with the clinical team to address health care and disease management related needs.    Follow Up Plan: Telephone follow up appointment with care management team member scheduled for: May 09, 2021. The patient has been provided with contact information for the care management team and has been advised to call with any health related questions or concerns.       Care Plan : Cardiovascular disease  Updates made by Luretha Rued, RN since 04/05/2021 12:00 AM    Problem: Cardiovascular disease   Priority: Medium    Long-Range Goal: Disease Progression Prevented or Minimized   Start Date: 04/05/2021  Expected End Date: 10/05/2021   This Visit's Progress: On track  Priority: Medium  Note:   Objective:  . Last practice recorded BP readings:  BP Readings from Last 3 Encounters:  04/01/21 104/72  03/31/21 (!) 151/58  03/30/21 (!) 166/48   Current Barriers:  . Long term plan of care needed for management of cardiovascular disease in a patient with history of atrial fibrillation, hypertension, aortic valve insufficiency, hyperlipidemia. Client reports he checks blood pressure daily and recently had an appointment with cardiologist on 04/01/2021. He states blood pressure today was 132/65. Per Client, cardiologist states blood pressure was within acceptable range and follow up appointment is in a year. Client reports atrial fibrillation is under controled and states he has been maintaining a normal rhythm since his cardioversion. He states he takes his medications as prescribed and denies any issues or concerns. He reports he continues to exercise daily. Case Manager Clinical Goal(s):  . patient will verbalize understanding of plan for management of cardiovascular disease . patient will attend all scheduled medical appointments: next appointment with PCP 07/28/21 .  patient will verbalize basic understanding of cardiovascular disease process and self health management plan as evidence by taking medications as prescribed, blood pressure maintained per provider recommendations. Interventions:  . Collaboration with [place], DO regarding development and update of comprehensive plan of care as evidenced by provider attestation and co-signature . Inter-disciplinary care team collaboration (see longitudinal plan of care) . Evaluation of current treatment plan related to hypertension self management and patient's adherence to plan as established by provider. . Reviewed medications with patient and discussed importance of compliance . Discussed plans with patient for ongoing care management follow up and provided patient with  direct contact information for care management team . Advised patient, providing education and rationale, to monitor blood pressure daily and record, calling PCP for findings outside established parameters.  . Reviewed scheduled/upcoming provider appointments including: next appointment 07/28/2021 . Provided positive feedback regarding management of health. Self-Care Activities: - Self administers medications as prescribed Attends all scheduled provider appointments Calls provider office for new concerns, questions, or BP outside discussed parameters Checks BP and records as discussed Follows a low sodium diet/DASH diet  Patient Goals: Self administers medications as prescribed Attends all scheduled provider appointments Calls provider office for new concerns, questions, or BP outside discussed parameters Follow a low sodium diet/DASH diet Check blood pressure daily Write blood pressure results in a log or diary Eat heart healthy low salt diet; whole grains, fruits and vegetables, lean meats and healthy fats. Continue exercise and maintain established routine.   Follow Up Plan: Telephone follow up appointment with care management team member scheduled for: 05/09/21 The patient has been provided with contact information for the care management team and has been advised to call with any health related questions or concerns.         Plan:Telephone follow up appointment with care management team member scheduled for:  05/09/2021 and The patient has been provided with contact information for the care management team and has been advised to call with any health related questions or concerns.   Thea Silversmith, RN, MSN, BSN, CCM Care Management Coordinator [facility] [phone]

## 2021-04-05 NOTE — Patient Instructions (Signed)
Visit Information: Thank you for taking the time to speak with me today.  PATIENT GOALS:  Goals Addressed            This Visit's Progress   . Maintain heart health       Timeframe:  Long-Range Goal Priority:  Medium Start Date:  04/05/2021                          Expected End Date:  10/05/2021                     Follow Up Date 05/09/2021     Patient Goals: Self administers medications as prescribed Attends all scheduled provider appointments Calls provider office for new concerns, questions, or BP outside discussed parameters Follow a low sodium diet/DASH diet Check blood pressure daily Write blood pressure results in a log or diary Eat heart healthy low salt diet; whole grains, fruits and vegetables, lean meats and healthy fats. Continue exercise and maintain established routine.   Why is this important?    You won't feel high blood pressure, but it can still hurt your blood vessels.   can cause heart or kidney problems. It can also cause a stroke.   Making lifestyle changes like losing a little weight or eating less salt will help.   Checking your blood pressure at home and at different times of the day can help to control blood pressure.   If the doctor prescribes medicine remember to take it the way the doctor ordered.   Call the office if you cannot afford the medicine or if there are questions about it.     Notes:     [name] Maintain My Quality of Life       Timeframe:  Long-Range Goal Priority:  High Start Date:  04/05/21                           Expected End Date:  10/05/21                     Follow Up Date 05/09/2021    Patient will self administer medications as prescribed Patient will attend all scheduled provider appointments Patient will call pharmacy for medication refills Patient will continue to perform ADL's independently Patient will continue to perform IADL's independently Patient will call provider office for new concerns or questions Patient  will work with [facility] to address care coordination needs and will continue to work with the clinical team to address health care and disease management related needs.         Hypertension, Adult High blood pressure (hypertension) is when the force of blood pumping through the arteries is too strong. The arteries are the blood vessels that carry blood from the heart throughout the body. Hypertension forces the heart to work harder to pump blood and may cause arteries to become narrow or stiff. Untreated or uncontrolled hypertension can cause a heart attack, heart failure, a stroke, kidney disease, and other problems. A blood pressure reading consists of a higher number over a lower number. Ideally, your blood pressure should be below 120/80. The first ("top") number is called the systolic pressure. It is a measure of the pressure in your arteries as your heart beats. The second ("bottom") number is called the diastolic pressure. It is a measure of the pressure in your arteries as the heart relaxes. What are the  causes? The exact cause of this condition is not known. There are some conditions that result in or are related to high blood pressure. What increases the risk? Some risk factors for high blood pressure are under your control. The following factors may make you more likely to develop this condition:  Smoking.  Having type 2 diabetes mellitus, high cholesterol, or both.  Not getting enough exercise or physical activity.  Being overweight.  Having too much fat, sugar, calories, or salt (sodium) in your diet.  Drinking too much alcohol. Some risk factors for high blood pressure may be difficult or impossible to change. Some of these factors include:  Having chronic kidney disease.  Having a family history of high blood pressure.  Age. Risk increases with age.  Race. You may be at higher risk if you are African American.  Gender. Men are at higher risk than women before age 20.  After age 77, women are at higher risk than men.  Having obstructive sleep apnea.  Stress. What are the signs or symptoms? High blood pressure may not cause symptoms. Very high blood pressure (hypertensive crisis) may cause:  Headache.  Anxiety.  Shortness of breath.  Nosebleed.  Nausea and vomiting.  Vision changes.  Severe chest pain.  Seizures. How is this diagnosed? This condition is diagnosed by measuring your blood pressure while you are seated, with your arm resting on a flat surface, your legs uncrossed, and your feet flat on the floor. The cuff of the blood pressure monitor will be placed directly against the skin of your upper arm at the level of your heart. It should be measured at least twice using the same arm. Certain conditions can cause a difference in blood pressure between your right and left arms. Certain factors can cause blood pressure readings to be lower or higher than normal for a short period of time:  When your blood pressure is higher when you are in a health care provider's office than when you are at home, this is called white coat hypertension. Most people with this condition do not need medicines.  When your blood pressure is higher at home than when you are in a health care provider's office, this is called masked hypertension. Most people with this condition may need medicines to control blood pressure. If you have a high blood pressure reading during one visit or you have normal blood pressure with other risk factors, you may be asked to:  Return on a different day to have your blood pressure checked again.  Monitor your blood pressure at home for 1 week or longer. If you are diagnosed with hypertension, you may have other blood or imaging tests to help your health care provider understand your overall risk for other conditions. How is this treated? This condition is treated by making healthy lifestyle changes, such as eating healthy foods,  exercising more, and reducing your alcohol intake. Your health care provider may prescribe medicine if lifestyle changes are not enough to get your blood pressure under control, and if:  Your systolic blood pressure is above 130.  Your diastolic blood pressure is above 80. Your personal target blood pressure may vary depending on your medical conditions, your age, and other factors. Follow these instructions at home: Eating and drinking  Eat a diet that is high in fiber and potassium, and low in sodium, added sugar, and fat. An example eating plan is called the DASH (Dietary Approaches to Stop Hypertension) diet. To eat this way: ?  Eat plenty of fresh fruits and vegetables. Try to fill one half of your plate at each meal with fruits and vegetables. ? Eat whole grains, such as whole-wheat pasta, brown rice, or whole-grain bread. Fill about one fourth of your plate with whole grains. ? Eat or drink low-fat dairy products, such as skim milk or low-fat yogurt. ? Avoid fatty cuts of meat, processed or cured meats, and poultry with skin. Fill about one fourth of your plate with lean proteins, such as fish, chicken without skin, beans, eggs, or tofu. ? Avoid pre-made and processed foods. These tend to be higher in sodium, added sugar, and fat.  Reduce your daily sodium intake. Most people with hypertension should eat less than 1,500 mg of sodium a day.  Do not drink alcohol if: ? Your health care provider tells you not to drink. ? You are pregnant, may be pregnant, or are planning to become pregnant.  If you drink alcohol: ? Limit how much you use to:  0-1 drink a day for women.  0-2 drinks a day for men. ? Be aware of how much alcohol is in your drink. In the [place] one drink equals one 12 oz bottle of beer (355 mL), one 5 oz glass of wine (148 mL), or one 1 oz glass of hard liquor (44 mL).   Lifestyle  Work with your health care provider to maintain a healthy body weight or to lose weight.  Ask what an ideal weight is for you.  Get at least 30 minutes of exercise most days of the week. Activities may include walking, swimming, or biking.  Include exercise to strengthen your muscles (resistance exercise), such as Pilates or lifting weights, as part of your weekly exercise routine. Try to do these types of exercises for 30 minutes at least 3 days a week.  Do not use any products that contain nicotine or tobacco, such as cigarettes, e-cigarettes, and chewing tobacco. If you need help quitting, ask your health care provider.  Monitor your blood pressure at home as told by your health care provider.  Keep all follow-up visits as told by your health care provider. This is important.   Medicines  Take over-the-counter and prescription medicines only as told by your health care provider. Follow directions carefully. Blood pressure medicines must be taken as prescribed.  Do not skip doses of blood pressure medicine. Doing this puts you at risk for problems and can make the medicine less effective.  Ask your health care provider about side effects or reactions to medicines that you should watch for. Contact a health care provider if you:  Think you are having a reaction to a medicine you are taking.  Have headaches that keep coming back (recurring).  Feel dizzy.  Have swelling in your ankles.  Have trouble with your vision. Get help right away if you:  Develop a severe headache or confusion.  Have unusual weakness or numbness.  Feel faint.  Have severe pain in your chest or abdomen.  Vomit repeatedly.  Have trouble breathing. Summary  Hypertension is when the force of blood pumping through your arteries is too strong. If this condition is not controlled, it may put you at risk for serious complications.  Your personal target blood pressure may vary depending on your medical conditions, your age, and other factors. For most people, a normal blood pressure is less than  120/80.  Hypertension is treated with lifestyle changes, medicines, or a combination of both. Lifestyle changes include  losing weight, eating a healthy, low-sodium diet, exercising more, and limiting alcohol. This information is not intended to replace advice given to you by your health care provider. Make sure you discuss any questions you have with your health care provider. Document Revised: 08/21/2018 Document Reviewed: 08/21/2018 Elsevier Patient Education  2021 [place].   Consent to CCM Services: Mr. Hamman was given information about Chronic Care Management services today including:  1. CCM service includes personalized support from designated clinical staff supervised by his physician, including individualized plan of care and coordination with other care providers 2. 24/7 contact phone numbers for assistance for urgent and routine care needs. 3. Service will only be billed when office clinical staff spend 20 minutes or more in a month to coordinate care. 4. Only one practitioner may furnish and bill the service in a calendar month. 5. The patient may stop CCM services at any time (effective at the end of the month) by phone call to the office staff. 6. The patient will be responsible for cost sharing (co-pay) of up to 20% of the service fee (after annual deductible is met).  Patient agreed to services and verbal consent obtained.   The patient verbalized understanding of instructions, educational materials, and care plan provided today and agreed to receive a mailed copy of patient instructions, educational materials, and care plan.   Telephone follow up appointment with care management team member scheduled for: 05/09/2021  Thea Silversmith, RN, MSN, BSN, CCM Care Management Coordinator [facility] [phone]   CLINICAL CARE PLAN: Patient Care Plan: Quality of Life    Problem Identified: Quality of Life (General Plan of Care) Resolved 04/05/2021     Long-Range Goal: Quality of Life Maintained Completed 04/05/2021  Start Date: 04/05/2021  Expected End Date: 10/05/2021  This Visit's Progress: On track  Priority: High  Note:   Current Barriers:  . Lack of Long Term care plan to maintain quality of life. Client reports he is doing well for age 56 years. Client repots he continues to fast walk daily, he cuts his own grass and manages an active garden. Client is independent and continues to drive without difficulty. Client reports a history of macular degeneration, but states with treatment he has not had any worsening of vision and has no problems reading a newspaper or other media. Reports he has supportive son and daughter in law, and states they talk every day, but are not able to see each other often due to distance. Client denies anxiety at this time. Reports some grief over loss of his wife approximately 5 years ago, but states he stays busy and counseling not needed. Declines social work referral. But is aware that service is available. Client reports recent diagnosis of arthritis of lower back and right hip, but states currently he is not in any pain. Client denies any falls. He would like to know if he can take time released tylenol if needed.  . Care Coordination needs related to Covid Booster: Client with question regarding if 2nd Covid Booster recommended by his provider.   Nurse Case Manager Clinical Goal(s):  . patient will attend all scheduled medical appointments: next primary care appointment scheduled for 07/28/21 . the patient will demonstrate ongoing self health care management ability as evidenced by continued independent care related to health care needs  Interventions:  . 1:1 collaboration with [name], [name], DO regarding development and update of comprehensive plan of care as evidenced by provider attestation and co-signature .  Inter-disciplinary care team collaboration (see longitudinal plan of care) . Evaluation of  current treatment plan related to chronic health conditions and patient's adherence to plan as established by provider. . Reviewed scheduled/upcoming provider appointments including:  . Discussed plans with patient for ongoing care management follow up and provided patient with direct contact information for care management team  Patient Goals/Self-Care Activities Patient will self administer medications as prescribed Patient will attend all scheduled provider appointments Patient will call pharmacy for medication refills Patient will continue to perform ADL's independently Patient will continue to perform IADL's independently Patient will call provider office for new concerns or questions Patient will work with [facility] to address care coordination needs and will continue to work with the clinical team to address health care and disease management related needs.    Follow Up Plan: The patient has been provided with contact information for the care management team and has been advised to call with any health related questions or concerns.  The care management team will reach out to the patient again over the next 45 days.           Evidence-based guidance:   Assess patient's thoughts about quality of life, goals and expectations, and dissatisfaction or desire to improve.   Identify issues of primary importance such as mental health, illness, exercise tolerance, pain, sexual function and intimacy, cognitive change, social isolation, finances and relationships.   Assess and monitor for signs/symptoms of psychosocial concerns, especially depression or ideations regarding harm to others or self; provide or refer for mental health services as needed.   Identify sensory issues that impact quality of life such as hearing loss, vision deficit; strategize ways to maintain or improve hearing, vision.   Promote access to services in the community to support independence such as support groups, home  visiting programs, financial assistance, handicapped parking tags, durable medical equipment and emergency responder.   Promote activities to decrease social isolation such as group support or social, leisure and recreational activities, employment, use of social media; consider safety concerns about being out of home for activities.   Provide patient an opportunity to share by storytelling or a "life review" to give positive meaning to life and to assist with coping and negative experiences.   Encourage patient to tap into hope to improve sense of self.   Counsel based on prognosis and as early as possible about end-of-life and palliative care; consider referral to palliative care provider.   Advocate for the development of palliative care plan that may include avoidance of unnecessary testing and intervention, symptom control, discontinuation of medications, hospice and organ donation.   Counsel as early as possible those with life-limiting chronic disease about palliative care; consider referral to palliative care provider.   Advocate for the development of palliative care plan.   Notes:    Problem Identified: Quality of Life   Priority: Medium    Long-Range Goal: Quality of Life Maintained   Start Date: 04/04/2021  Expected End Date: 10/05/2021  This Visit's Progress: On track  Note:   Current Barriers:  . Lack of Long Term care plan to maintain quality of life. Client reports he is doing well for age 87 years. Client reports he continues to fast walk daily, he cuts his own grass and manages an active garden. Client is independent and continues to drive without difficulty. Client reports a history of macular degeneration, but states with treatment he has not had any worsening of vision and has no problems reading  a newspaper or other media. Reports he has supportive son and daughter in law, and states they talk every day, but are not able to see each other often due to distance. Client  denies anxiety at this time. Reports some grief over loss of his wife approximately 5 years ago, but states he stays busy and states counseling or social work referral is not needed, Although, he is aware that this service is available to him. Client reports recent diagnosis of arthritis of lower back and right hip, but states currently he is not in any pain. Client denies any falls. He would like to know if he can take time released tylenol if needed. . Care Coordination needs: Client would like to know if it is recommended for him to take a second his Covid booster and if so, how to go about getting it.  Nurse Case Manager Clinical Goal(s):  . patient will verbalize understanding of plan for continued self management of health . the patient will demonstrate ongoing self health care management ability as evidenced by attending provider visits, taking medications as prescribed, continuing activities that encourage fulfullment in his life such as walking, gardening, outside activites.  Interventions:  . 1:1 collaboration with [name], [name], DO regarding development and update of comprehensive plan of care as evidenced by provider attestation and co-signature . Inter-disciplinary care team collaboration (see longitudinal plan of care) . Evaluation of current treatment plan related to chronic disease processes and patient's adherence to plan as established by provider. Nash Dimmer with primary care provider regarding patient's question about Covid vaccine about Covid Vaccine and use of time released tylenol. . Discussed plans with patient for ongoing care management follow up and provided patient with direct contact information for care management team . Reviewed upcoming provider appointment: 07/28/2021  Patient Goals/Self-Care Activities Patient will self administer medications as prescribed Patient will attend all scheduled provider appointments Patient will call pharmacy for medication  refills Patient will continue to perform ADL's independently Patient will continue to perform IADL's independently Patient will call provider office for new concerns or questions Patient will work with [facility] to address care coordination needs and will continue to work with the clinical team to address health care and disease management related needs.    Follow Up Plan: Telephone follow up appointment with care management team member scheduled for: May 09, 2021. The patient has been provided with contact information for the care management team and has been advised to call with any health related questions or concerns.       Patient Care Plan: Cardiovascular disease    Problem Identified: Cardiovascular disease   Priority: Medium    Long-Range Goal: Disease Progression Prevented or Minimized   Start Date: 04/05/2021  Expected End Date: 10/05/2021  This Visit's Progress: On track  Priority: Medium  Note:   Objective:  . Last practice recorded BP readings:  BP Readings from Last 3 Encounters:  04/01/21 104/72  03/31/21 (!) 151/58  03/30/21 (!) 166/48   Current Barriers:  . Long term plan of care needed for management of cardiovascular disease in a patient with history of atrial fibrillation, hypertension, aortic valve insufficiency, hyperlipidemia. Client reports he checks blood pressure daily and recently had an appointment with cardiologist on 04/01/2021. He states blood pressure today was 132/65. Per Client, cardiologist states blood pressure was within acceptable range and follow up appointment is in a year. Client reports atrial fibrillation is under controled and states he has been maintaining a normal  rhythm since his cardioversion. He states he takes his medications as prescribed and denies any issues or concerns. He reports he continues to exercise daily. Case Manager Clinical Goal(s):  . patient will verbalize understanding of plan for management of cardiovascular  disease . patient will attend all scheduled medical appointments: next appointment with PCP 07/28/21 . patient will verbalize basic understanding of cardiovascular disease process and self health management plan as evidence by taking medications as prescribed, blood pressure maintained per provider recommendations. Interventions:  . Collaboration with [place], DO regarding development and update of comprehensive plan of care as evidenced by provider attestation and co-signature . Inter-disciplinary care team collaboration (see longitudinal plan of care) . Evaluation of current treatment plan related to hypertension self management and patient's adherence to plan as established by provider. . Reviewed medications with patient and discussed importance of compliance . Discussed plans with patient for ongoing care management follow up and provided patient with direct contact information for care management team . Advised patient, providing education and rationale, to monitor blood pressure daily and record, calling PCP for findings outside established parameters.  . Reviewed scheduled/upcoming provider appointments including: next appointment 07/28/2021 . Provided positive feedback regarding management of health. Self-Care Activities: - Self administers medications as prescribed Attends all scheduled provider appointments Calls provider office for new concerns, questions, or BP outside discussed parameters Checks BP and records as discussed Follows a low sodium diet/DASH diet  Patient Goals: Self administers medications as prescribed Attends all scheduled provider appointments Calls provider office for new concerns, questions, or BP outside discussed parameters Follow a low sodium diet/DASH diet Check blood pressure daily Write blood pressure results in a log or diary Eat heart healthy low salt diet; whole grains, fruits and vegetables, lean meats and healthy fats. Continue exercise  and maintain established routine.   Follow Up Plan: Telephone follow up appointment with care management team member scheduled for: 05/09/21 The patient has been provided with contact information for the care management team and has been advised to call with any health related questions or concerns.

## 2021-04-07 ENCOUNTER — Ambulatory Visit: Payer: Medicare Other

## 2021-04-07 DIAGNOSIS — I10 Essential (primary) hypertension: Secondary | ICD-10-CM

## 2021-04-07 DIAGNOSIS — E785 Hyperlipidemia, unspecified: Secondary | ICD-10-CM | POA: Diagnosis not present

## 2021-04-07 DIAGNOSIS — I480 Paroxysmal atrial fibrillation: Secondary | ICD-10-CM

## 2021-04-07 NOTE — Patient Instructions (Signed)
Visit Information: Thank you for taking the time to speak with me  PATIENT GOALS: Goals Addressed            This Visit's Progress   . Maintain heart health   On track    Timeframe:  Long-Range Goal Priority:  Medium Start Date:  04/05/2021                          Expected End Date:  10/05/2021                     Follow Up Date 05/09/2021     Patient Goals: Self administers medications as prescribed Attends all scheduled provider appointments Calls provider office for new concerns, questions, or BP outside discussed parameters Follow a low sodium diet/DASH diet Check blood pressure daily Write blood pressure results in a log or diary Eat heart healthy low salt diet; whole grains, fruits and vegetables, lean meats and healthy fats. Continue exercise and maintain established routine.   Why is this important?    You won't feel high blood pressure, but it can still hurt your blood vessels.   can cause heart or kidney problems. It can also cause a stroke.   Making lifestyle changes like losing a little weight or eating less salt will help.   Checking your blood pressure at home and at different times of the day can help to control blood pressure.   If the doctor prescribes medicine remember to take it the way the doctor ordered.   Call the office if you cannot afford the medicine or if there are questions about it.     Notes:     [name] Maintain My Quality of Life   On track    Timeframe:  Long-Range Goal Priority:  High Start Date:  04/05/21                           Expected End Date:  10/05/21                     Follow Up Date 05/09/2021    Patient will self administer medications as prescribed Patient will attend all scheduled provider appointments Patient will call pharmacy for medication refills Patient will continue to perform ADL's independently Patient will continue to perform IADL's independently Patient will call provider office for new concerns or  questions Patient will work with [facility] to address care coordination needs and will continue to work with the clinical team to address health care and disease management related needs.   [facility]or [facility] regarding Covid booster       The patient verbalized understanding of instructions, educational materials, and care plan provided today and declined offer to receive copy of patient instructions, educational materials, and care plan.   Telephone follow up appointment with care management team member scheduled for:May 09, 2021 at 11:30am  Thea Silversmith, RN, MSN, BSN, CCM Care Management Coordinator [facility] [phone]

## 2021-04-07 NOTE — Chronic Care Management (AMB) (Addendum)
Chronic Care Management   CCM RN Visit Note  04/07/2021 Name: Mark Stark. MRN: [id number] DOB: 08-19-1928  Subjective: Mark Stark. is a 87 y.o. year old male who is a primary care patient of [name], DO. The care management team was consulted for assistance with disease management and care coordination needs.    Engaged with patient by telephone for follow up visit in response to provider referral for case management and/or care coordination services.   Consent to Services:  The patient was given information about Chronic Care Management services, agreed to services, and gave verbal consent prior to initiation of services.  Please see initial visit note for detailed documentation.   Patient agreed to services and verbal consent obtained.   Assessment: Review of patient past medical history, allergies, medications, health status, including review of consultants reports, laboratory and other test data, was performed as part of comprehensive evaluation and provision of chronic care management services.   SDOH (Social Determinants of Health) assessments and interventions performed:    CCM Care Plan  Allergies  Allergen Reactions  . Sulfonamide Derivatives Other (See Comments)    Unknown    Outpatient Encounter Medications as of 04/07/2021  Medication Sig  . b complex vitamins tablet Take 1 tablet by mouth daily.  . Calcium Carbonate-Vitamin D 600-400 MG-UNIT tablet Take 1 tablet by mouth daily.  . clorazepate (TRANXENE) 15 MG tablet 1 po qhs prn  . cromolyn (NASALCROM) 5.2 MG/ACT nasal spray Place 2 sprays into the nose 2 (two) times daily.  [name] doxazosin (CARDURA) 4 MG tablet Take 1 tablet (4 mg total) by mouth at bedtime.  [name] ELIQUIS 5 MG TABS tablet TAKE 1 TABLET BY MOUTH TWICE A DAY  . fenofibrate 160 MG tablet Take 1 tablet (160 mg total) by mouth daily.  . fish oil-omega-3 fatty acids 1000 MG capsule Take 4 g by mouth daily.  . hydrochlorothiazide (MICROZIDE)  12.5 MG capsule TAKE 1 CAPSULE (12.5 MG TOTAL) BY MOUTH AS NEEDED (SWELLING).  [name] lisinopril (ZESTRIL) 20 MG tablet TAKE 1 TABLET BY MOUTH EVERY DAY  . metoprolol (TOPROL-XL) 200 MG 24 hr tablet Take 0.5 tablets (100 mg total) by mouth daily.  . Multiple Vitamins-Minerals (MULTIVITAMIN PO) Take 1 tablet by mouth daily.  . Multiple Vitamins-Minerals (PRESERVISION AREDS 2) CAPS 2 po qd (Patient taking differently: Take 2 capsules by mouth daily.)  . simvastatin (ZOCOR) 20 MG tablet Take 1 tablet (20 mg total) by mouth at bedtime.  . vitamin C (ASCORBIC ACID) 500 MG tablet Take 500 mg by mouth daily.   No facility-administered encounter medications on file as of 04/07/2021.    Patient Active Problem List   Diagnosis Date Noted  . Thrombocytopenia ([place]) 03/30/2021  . Screening for HIV (human immunodeficiency virus) 03/30/2021  . Monocytosis 03/30/2021  . Abnormal platelets ([place]) 03/28/2021  . Hip pain 03/28/2021  . Persistent atrial fibrillation ([place])   . Paroxysmal atrial fibrillation ([place]) 01/12/2018  . Cystoid macular edema of right eye 11/29/2017  . Macular degeneration 12/21/2015  . 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 eye with active choroidal neovascularization ([place]) 04/30/2014  . Myalgia and myositis 11/16/2013  . Epiretinal membrane 11/07/2012  . Posterior vitreous detachment 11/07/2012  . Status post intraocular lens implant 11/07/2012  . Fungal ear infection 04/09/2012  . Abnormal laboratory test 03/27/2011  . AI (aortic insufficiency) 03/27/2011  . SYSTOLIC MURMUR 29/93/7169  . EDEMA 12/05/2010  . ECCHYMOSES, SPONTANEOUS 11/28/2010  . CONTUSION,  RIGHT THIGH 11/28/2010  . DECREASED HEARING, BILATERAL 09/23/2010  . Pain in limb 07/28/2009  . ANKLE SPRAIN, LEFT 07/28/2009  . CONTUSION, LOWER LEG, LEFT 07/28/2009  . DERMATITIS, CONTACT, NOS 10/02/2007  . Essential hypertension 05/07/2007  . PROSTATE CANCER, HX OF 03/13/2007  . Hyperlipidemia 01/15/2007  .  Anxiety state 01/15/2007  . DIVERTICULITIS, HX OF 01/15/2007    Conditions to be addressed/monitored:Atrial Fibrillation, HTN and HLD  Care Plan : Quality of Life  Updates made by Luretha Rued, RN since 04/07/2021 12:00 AM  Problem: Quality of Life   Priority: Medium  Long-Range Goal: Quality of Life Maintained   Start Date: 04/04/2021  Expected End Date: 10/05/2021  This Visit's Progress: On track  Recent Progress: On track  Current Barriers:  . Lack of Long Term care plan to maintain quality of life. Client reports he is doing well for age 11 years. Client reports he continues to fast walk daily, he cuts his own grass and manages an active garden. Client is independent and continues to drive without difficulty. Client reports a history of macular degeneration, but states with treatment he has not had any worsening of vision and has no problems reading a newspaper or other media. Reports he has supportive son and daughter in law, and states they talk every day, but are not able to see each other often due to distance. Client denies anxiety at this time. Reports some grief over loss of his wife approximately 5 years ago, but states he stays busy and states counseling or social work referral is not needed, Although, he is aware that this service is available to him. Client reports recent diagnosis of arthritis of lower back and right hip, but states currently he is not in any pain. Client denies any falls. He would like to know if he can take time released tylenol if needed.  . Care Coordination needs: Client would like to know if it is recommended for him to take a second his Covid booster and if so, how to go about getting it.  . Client with question post past PCP visit re: if primary care recommends orthopedic referral for history of back/hip pain. He reports it is tolerable, not affecting ADL's. Client states he will contact primary care provider if condition worsens. Nurse Case Manager  Clinical Goal(s):  . patient will verbalize understanding of plan for continued self management of health . the patient will demonstrate ongoing self health care management ability as evidenced by attending provider visits, taking medications as prescribed, continuing activities that encourage fulfullment in his life such as walking, gardening, outside activites.  Interventions:  . 1:1 collaboration with [name], [name], DO regarding development and update of comprehensive plan of care as evidenced by provider attestation and co-signature . Inter-disciplinary care team collaboration (see longitudinal plan of care) . Evaluation of current treatment plan related to chronic disease processes and patient's adherence to plan as established by provider. Nash Dimmer with primary care provider regarding patient's question about whether an orthopedic referral is needed.   . Discussed plans with patient for ongoing care management follow up and provided patient with direct contact information for care management team . Reviewed upcoming provider appointment: with primary care provider 07/28/2021 . Spoke with client: informed of provider response: client can take timed release tylenol as long as he follows the directions on the bottle; he can get the booster as well.  . [facility]formed that [facility] 2nd [facility] vaccines and also provided  contact number for [facility] Vaccine scheduler [phone]).  Patient Goals/Self-Care Activities Patient will self administer medications as prescribed Patient will attend all scheduled provider appointments Patient will call pharmacy for medication refills Patient will continue to perform ADL's independently Patient will continue to perform IADL's independently Patient will call provider office for new concerns or questions Patient will work with [facility] to address care coordination needs and will continue to work with the clinical team to address  health care and disease management related needs.   [facility] [place] regarding Covid booster  Follow Up Plan: Telephone follow up appointment with care management team member scheduled for: May 09, 2021. The patient has been provided with contact information for the care management team and has been advised to call with any health related questions or concerns.      Plan:The patient has been provided with contact information for the care management team and has been advised to call with any health related questions or concerns.  and The care management team will reach out to the patient again over the next 45 days.  Thea Silversmith, RN, MSN, BSN, CCM Care Management Coordinator [facility] [phone]

## 2021-04-21 ENCOUNTER — Other Ambulatory Visit: Payer: Self-pay | Admitting: Family Medicine

## 2021-04-21 DIAGNOSIS — F411 Generalized anxiety disorder: Secondary | ICD-10-CM

## 2021-04-22 NOTE — Telephone Encounter (Signed)
Requesting: clorazepate Contract: 07/12/18 UDS: 07/12/18 Last Visit: 03/28/21 Next Visit: 07/28/21 Last Refill: 02/07/21  Please Advise

## 2021-04-27 ENCOUNTER — Other Ambulatory Visit: Payer: Self-pay | Admitting: Family Medicine

## 2021-04-27 DIAGNOSIS — I10 Essential (primary) hypertension: Secondary | ICD-10-CM

## 2021-05-05 DIAGNOSIS — H353221 Exudative age-related macular degeneration, left eye, with active choroidal neovascularization: Secondary | ICD-10-CM | POA: Diagnosis not present

## 2021-05-09 ENCOUNTER — Ambulatory Visit (INDEPENDENT_AMBULATORY_CARE_PROVIDER_SITE_OTHER): Payer: Medicare Other

## 2021-05-09 DIAGNOSIS — I480 Paroxysmal atrial fibrillation: Secondary | ICD-10-CM | POA: Diagnosis not present

## 2021-05-09 DIAGNOSIS — I10 Essential (primary) hypertension: Secondary | ICD-10-CM

## 2021-05-09 DIAGNOSIS — E785 Hyperlipidemia, unspecified: Secondary | ICD-10-CM

## 2021-05-09 NOTE — Patient Instructions (Signed)
Visit Information: Thank you for taking to time to speak with me today.  PATIENT GOALS: Goals Addressed            This Visit's Progress   . Maintain heart health   On track    Timeframe:  Long-Range Goal Priority:  Medium Start Date:  04/05/2021                          Expected End Date:  10/05/2021                     Follow Up Date 08/15/2021     Patient Goals: Continue to take medications as prescribed Continue to attends all scheduled provider appointments Call provider office for new concerns, questions, or BP outside parameters Continue to follow a heart healthy/DASH diet Continue to monitor blood pressure as recommended by provider and record. Continue exercise and/or maintain established activity routine. Review Emmi article "Heart Healthy diet" and "Dietary Fats". Call if you have any questions.     Why is this important?    You won't feel high blood pressure, but it can still hurt your blood vessels.   can cause heart or kidney problems. It can also cause a stroke.   Making lifestyle changes like losing a little weight or eating less salt will help.   Checking your blood pressure at home and at different times of the day can help to control blood pressure.   If the doctor prescribes medicine remember to take it the way the doctor ordered.   Call the office if you cannot afford the medicine or if there are questions about it.     Notes:     [name] Maintain My Quality of Life   On track    Timeframe:  Long-Range Goal Priority:  High Start Date:  04/05/21                           Expected End Date:  10/05/21                     Follow Up Date 08/15/2021    Continue to take medications as prescribed Continue to attend scheduled provider appointments. Per chart appointment scheduled on 07/28/21 and 08/08/21 with Primary Care Provider. Call provider office for new concerns or questions Continue to work with [facility] to address care coordination needs and/or clinical  team to address health care and disease management related needs.          The patient verbalized understanding of instructions, educational materials, and care plan provided today and agreed to receive a mailed copy of patient instructions, educational materials, and care plan.   Telephone follow up appointment with care management team member scheduled for: 08/15/2021 The patient has been provided with contact information for the care management team and has been advised to call with any health related questions or concerns.   Thea Silversmith, RN, MSN, BSN, CCM Care Management Coordinator [facility] [phone]

## 2021-05-09 NOTE — Chronic Care Management (AMB) (Signed)
Chronic Care Management   CCM RN Visit Note  05/09/2021 Name: Mark Stark. MRN: [id number] DOB: [birth year]  Subjective: Mark Stark. is a 87 y.o. year old male who is a primary care patient of [name], DO. The care management team was consulted for assistance with disease management and care coordination needs.    Engaged with patient by telephone for follow up visit in response to provider referral for case management and/or care coordination services.   Consent to Services:  The patient was given information about Chronic Care Management services, agreed to services, and gave verbal consent prior to initiation of services.  Please see initial visit note for detailed documentation.   Patient agreed to services and verbal consent obtained.   Assessment: Review of patient past medical history, allergies, medications, health status, including review of consultants reports, laboratory and other test data, was performed as part of comprehensive evaluation and provision of chronic care management services.   SDOH (Social Determinants of Health) assessments and interventions performed:    CCM Care Plan  Allergies  Allergen Reactions  . Sulfonamide Derivatives Other (See Comments)    Unknown    Outpatient Encounter Medications as of 05/09/2021  Medication Sig  . b complex vitamins tablet Take 1 tablet by mouth daily.  . Calcium Carbonate-Vitamin D 600-400 MG-UNIT tablet Take 1 tablet by mouth daily.  . clorazepate (TRANXENE) 15 MG tablet TAKE 1 TABLET BY MOUTH EVERY DAY AT BEDTIME AS NEEDED  . cromolyn (NASALCROM) 5.2 MG/ACT nasal spray Place 2 sprays into the nose 2 (two) times daily.  [name] doxazosin (CARDURA) 4 MG tablet Take 1 tablet (4 mg total) by mouth at bedtime.  [name] ELIQUIS 5 MG TABS tablet TAKE 1 TABLET BY MOUTH TWICE A DAY  . fenofibrate 160 MG tablet TAKE 1 TABLET BY MOUTH EVERY DAY  . fish oil-omega-3 fatty acids 1000 MG capsule Take 4 g by mouth daily.  .  hydrochlorothiazide (MICROZIDE) 12.5 MG capsule TAKE 1 CAPSULE (12.5 MG TOTAL) BY MOUTH AS NEEDED (SWELLING).  [name] lisinopril (ZESTRIL) 20 MG tablet TAKE 1 TABLET BY MOUTH EVERY DAY  . metoprolol (TOPROL-XL) 200 MG 24 hr tablet TAKE 1/2 TABLET BY MOUTH DAILY  . Multiple Vitamins-Minerals (MULTIVITAMIN PO) Take 1 tablet by mouth daily.  . Multiple Vitamins-Minerals (PRESERVISION AREDS 2) CAPS 2 po qd (Patient taking differently: Take 2 capsules by mouth daily.)  . simvastatin (ZOCOR) 20 MG tablet TAKE 1 TABLET BY MOUTH EVERYDAY AT BEDTIME  . vitamin C (ASCORBIC ACID) 500 MG tablet Take 500 mg by mouth daily.   No facility-administered encounter medications on file as of 05/09/2021.    Patient Active Problem List   Diagnosis Date Noted  . Thrombocytopenia ([place]) 03/30/2021  . Screening for HIV (human immunodeficiency virus) 03/30/2021  . Monocytosis 03/30/2021  . Abnormal platelets ([place]) 03/28/2021  . Hip pain 03/28/2021  . Persistent atrial fibrillation ([place])   . Paroxysmal atrial fibrillation ([place]) 01/12/2018  . Cystoid macular edema of right eye 11/29/2017  . Macular degeneration 12/21/2015  . 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 eye with active choroidal neovascularization ([place]) 04/30/2014  . Myalgia and myositis 11/16/2013  . Epiretinal membrane 11/07/2012  . Posterior vitreous detachment 11/07/2012  . Status post intraocular lens implant 11/07/2012  . Fungal ear infection 04/09/2012  . Abnormal laboratory test 03/27/2011  . AI (aortic insufficiency) 03/27/2011  . SYSTOLIC MURMUR 30/16/0109  . EDEMA 12/05/2010  . ECCHYMOSES, SPONTANEOUS 11/28/2010  . CONTUSION,  RIGHT THIGH 11/28/2010  . DECREASED HEARING, BILATERAL 09/23/2010  . Pain in limb 07/28/2009  . ANKLE SPRAIN, LEFT 07/28/2009  . CONTUSION, LOWER LEG, LEFT 07/28/2009  . DERMATITIS, CONTACT, NOS 10/02/2007  . Essential hypertension 05/07/2007  . PROSTATE CANCER, HX OF 03/13/2007  . Hyperlipidemia  01/15/2007  . Anxiety state 01/15/2007  . DIVERTICULITIS, HX OF 01/15/2007    Conditions to be addressed/monitored:Atrial Fibrillation, HTN and HLD  Care Plan : Quality of Life  Updates made by Luretha Rued, RN since 05/09/2021 12:00 AM  Problem: Quality of Life   Priority: Medium  Long-Range Goal: Quality of Life Maintained   Start Date: 04/04/2021  Expected End Date: 10/05/2021  This Visit's Progress: On track  Recent Progress: On track  Priority: Medium  Current Barriers:  . Lack of Long Term care plan to maintain quality of life. Client reports "things are going fine. Everything is going very well. He reports lower hip/back pain chronic past 40-50 years and states it is under control. He states he continues to work in the garden with no pain issues. But states with continues sitting he will stiffen up. He denies any issues at this time.  Nurse Case Manager Clinical Goal(s):  . patient will verbalize understanding of plan for continued self management of health . the patient will demonstrate ongoing self health care management ability as evidenced by attending provider visits, taking medications as prescribed, continuing activities that encourage fulfullment in his life such as walking, gardening, outside activites. Interventions:  . 1:1 collaboration with [name], [name], DO regarding development and update of comprehensive plan of care as evidenced by provider attestation and co-signature . Inter-disciplinary care team collaboration (see longitudinal plan of care) . Evaluation of current treatment plan related to chronic disease processes and patient's adherence to plan as established by provider. . Discussed heart healthy diet plan . Discussed plans with patient for ongoing care management follow up and provided patient with direct contact information for care management team . Reviewed upcoming provider appointment: per chart visit scheduled on 07/28/2021 and  08/08/2021 . Discussed Covid Booster. Client reports he received his second Booster Dispensing optician) at [place] on 04/11/2021 Patient Goals/Self-Care Activities Continue to take medications as prescribed Continue to attend scheduled provider appointments. Per chart appointment scheduled on 07/28/21 and 08/08/21 with Primary Care Provider. Call provider office for new concerns or questions Continue to work with [facility] to address care coordination needs and/or clinical team to address health care and disease management related needs.   Follow Up Plan: Telephone follow up appointment with care management team member scheduled for: August 15, 2021. The patient has been provided with contact information for the care management team and has been advised to call with any health related questions or concerns.    Care Plan : Cardiovascular disease  Updates made by Luretha Rued, RN since 05/09/2021 12:00 AM    Problem: Cardiovascular disease   Priority: Medium    Long-Range Goal: Disease Progression Prevented or Minimized   Start Date: 04/05/2021  Expected End Date: 10/05/2021  This Visit's Progress: On track  Recent Progress: On track  Priority: Medium  Note:   Objective:  . Last practice recorded BP readings:  BP Readings from Last 3 Encounters:  04/01/21 104/72  03/31/21 (!) 151/58  03/30/21 (!) 166/48   . Most recent lipid panel:     Component Value Date/Time   CHOL 98 02/07/2021 1353   TRIG 69.0 02/07/2021 1353   HDL 44.80  02/07/2021 1353   CHOLHDL 2 02/07/2021 1353   VLDL 13.8 02/07/2021 1353   LDLCALC 39 02/07/2021 1353   LDLCALC 32 01/11/2018 1454   Current Barriers:  . Long term plan of care needed for management of cardiovascular disease in a patient with history of atrial fibrillation, hypertension, aortic valve insufficiency, hyperlipidemia. Client reports he continues to check and record blood pressure daily. Blood pressure today was 135/58. Client reports he continues to be  active and denies any questions or concerns.  Case Manager Clinical Goal(s):  . patient will verbalize understanding of plan for management of cardiovascular disease . patient will attend all scheduled medical appointments: appointment in chart for 07/28/21 and 08/08/21 with primary care office. . patient will verbalize basic understanding of cardiovascular disease process and self health management plan as evidence by taking medications as prescribed, blood pressure maintained per provider recommendations. Interventions:  . Collaboration with [place], DO regarding development and update of comprehensive plan of care as evidenced by provider attestation and co-signature . Inter-disciplinary care team collaboration (see longitudinal plan of care) . Evaluation of current treatment plan related to hypertension self management and patient's adherence to plan as established by provider. . Reviewed scheduled/upcoming provider appointments including: per chart appointment 07/28/2021 and appointment 08/08/21 at primary care provider office . Reinforced CCM role with client: to assist client with maintaining and/or improving health and assist with resources as needed. . Discussed ongoing care management follow up and client request to spread out follow up telephonic appointments. Provided client with direct contact information and encouraged client to  call anytime as needed. . Provided positive feedback regarding self-management of health. Self-Care Activities: . Self administers medications as prescribed . Attends all scheduled provider appointments . Calls provider office for new concerns, questions, or BP outside discussed parameters . Checks BP and records as discussed . Follows a Heart healthy/DASH diet  Patient Goals: Continue to take medications as prescribed Continue to attends all scheduled provider appointments Call provider office for new concerns, questions, or BP outside  parameters Continue to follow a heart healthy/DASH diet Continue to monitor blood pressure as recommended by provider and record. Continue exercise and/or maintain established activity routine. Review Emmi article "Heart Healthy diet" and "Dietary Fats". Call if you have any questions.    Follow Up Plan: Telephone follow up appointment with care management team member scheduled for: 08/15/21 The patient has been provided with contact information for the care management team and has been advised to call with any health related questions or concerns.       Plan:Telephone follow up appointment with care management team member scheduled for:  08/15/21 and The patient has been provided with contact information for the care management team and has been advised to call with any health related questions or concerns.   Thea Silversmith, RN, MSN, BSN, CCM Care Management Coordinator [facility] [phone]

## 2021-05-16 ENCOUNTER — Other Ambulatory Visit: Payer: Self-pay | Admitting: Family Medicine

## 2021-05-16 DIAGNOSIS — I10 Essential (primary) hypertension: Secondary | ICD-10-CM

## 2021-05-20 ENCOUNTER — Other Ambulatory Visit: Payer: Self-pay | Admitting: Family Medicine

## 2021-05-20 DIAGNOSIS — I480 Paroxysmal atrial fibrillation: Secondary | ICD-10-CM

## 2021-06-23 ENCOUNTER — Other Ambulatory Visit: Payer: Self-pay | Admitting: Family Medicine

## 2021-06-23 DIAGNOSIS — F411 Generalized anxiety disorder: Secondary | ICD-10-CM

## 2021-06-23 MED ORDER — CLORAZEPATE DIPOTASSIUM 15 MG PO TABS
ORAL_TABLET | ORAL | 1 refills | Status: DC
Start: 2021-06-23 — End: 2021-08-19

## 2021-06-23 NOTE — Telephone Encounter (Signed)
Printed med by mistake

## 2021-06-30 DIAGNOSIS — H43813 Vitreous degeneration, bilateral: Secondary | ICD-10-CM | POA: Diagnosis not present

## 2021-06-30 DIAGNOSIS — H353231 Exudative age-related macular degeneration, bilateral, with active choroidal neovascularization: Secondary | ICD-10-CM | POA: Diagnosis not present

## 2021-06-30 DIAGNOSIS — H35373 Puckering of macula, bilateral: Secondary | ICD-10-CM | POA: Diagnosis not present

## 2021-07-28 ENCOUNTER — Ambulatory Visit (INDEPENDENT_AMBULATORY_CARE_PROVIDER_SITE_OTHER): Payer: Medicare Other | Admitting: Family Medicine

## 2021-07-28 ENCOUNTER — Other Ambulatory Visit: Payer: Self-pay

## 2021-07-28 ENCOUNTER — Encounter: Payer: Self-pay | Admitting: Family Medicine

## 2021-07-28 VITALS — BP 128/84 | HR 73 | Temp 98.4°F | Resp 18 | Ht 68.0 in | Wt 166.0 lb

## 2021-07-28 DIAGNOSIS — H353211 Exudative age-related macular degeneration, right eye, with active choroidal neovascularization: Secondary | ICD-10-CM

## 2021-07-28 DIAGNOSIS — D691 Qualitative platelet defects: Secondary | ICD-10-CM | POA: Diagnosis not present

## 2021-07-28 DIAGNOSIS — C61 Malignant neoplasm of prostate: Secondary | ICD-10-CM | POA: Insufficient documentation

## 2021-07-28 DIAGNOSIS — I10 Essential (primary) hypertension: Secondary | ICD-10-CM | POA: Diagnosis not present

## 2021-07-28 DIAGNOSIS — Z79899 Other long term (current) drug therapy: Secondary | ICD-10-CM

## 2021-07-28 DIAGNOSIS — E785 Hyperlipidemia, unspecified: Secondary | ICD-10-CM

## 2021-07-28 MED ORDER — DOXAZOSIN MESYLATE 4 MG PO TABS: 4.0000 mg | ORAL_TABLET | Freq: Every day | ORAL | 1 refills | Status: DC

## 2021-07-28 NOTE — Assessment & Plan Note (Signed)
Well controlled, no changes to meds. Encouraged heart healthy diet such as the DASH diet and exercise as tolerated.  °

## 2021-07-28 NOTE — Progress Notes (Signed)
Subjective:   By signing my name below, I, Mark Stark, attest that this documentation has been prepared under the direction and in the presence of Dr. Roma Schanz, DO. 07/28/2021     Patient ID: Mark Riles., male    DOB: [birth year], 87 y.o.   MRN: [id number]  Chief Complaint  Patient presents with   Hypertension   Hyperlipidemia   Follow-up    HPI Patient is in today for a office visit.  He continues having back pain and went to see another provider and found that he had arthritis. His pain has not improved since he found out.  He also complains of feeling fatigues recently. He found during his last lab work he had low platelet count due to dehydration. He notes that he did not drink too much water before his doctor visit so he would not urinate frequently and since then he started drinking water more often. He also complains of developing weakness in his legs recently. He continues participating in exercise by walking daily. The recently developed weakness make it difficult in the beginning of his walk but feels better throughout the walk. He thinks his leg weakness may be a precursor to heart disease. He notes that he has gained a few lb's but thinks this may also be caused by his diet.  He reports having leg swelling. He is taking 12.5 mg hydrochlorothiazide PRN to manage his swelling and finds no relief. He has not tried taking it daily PO and reports that he will try that before taking any more medication. He is also planning to bring this issue up with his cardiologist.  He continues taking 15 mg clorazepate daily PO and reports no new issues while taking I.   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[place])    Prostate cancer [facility])     Past Surgical History:  Procedure Laterality Date   CARDIOVERSION N/A 03/01/2018   Procedure: CARDIOVERSION;  Surgeon: Troy Sine, MD;  Location: [facility] ENDOSCOPY;  Service: Cardiovascular;  Laterality: N/A;   CATARACT EXTRACTION  01/27/2010   right   CATARACT EXTRACTION  03/04/2010   left   Prostate seed implants     PROSTATE SURGERY      Family History  Problem Relation Age of Onset   Dementia Mother    Alzheimer's disease Mother    Mental illness Mother        ALZ DISEASE   Cancer Brother 8       pancreatic cancer    Social History   Socioeconomic History   Marital status: Widowed    Spouse name: Not on file   Number of children: Not on file   Years of education: Not on file   Highest education level: Not on file  Occupational History   Occupation: Professor---RETIRED    Employer: RETIRED    Comment: Retired  Tobacco Use   Smoking status: Never   Smokeless tobacco: Never  Vaping Use   Vaping Use: Never used  Substance and Sexual Activity   Alcohol use: No    Comment: Little wine occasionally    Drug use: No   Sexual activity: Never    Partners: Female  Other Topics Concern   Not on file  Social History Narrative   Not on file   Social Determinants of Health   Financial Resource Strain:  Low Risk    Difficulty of Paying Living Expenses: Not hard at all  Food Insecurity: No Food Insecurity   Worried About Running Out of Food in the Last Year: Never true   Ran Out of Food in the Last Year: Never true  Transportation Needs: No Transportation Needs   Lack of Transportation (Medical): No   Lack of Transportation (Non-Medical): No  Physical Activity: Sufficiently Active   Days of Exercise per Week: 6 days   Minutes of Exercise per Session: 60 min  Stress: No Stress Concern Present   Feeling of Stress : Not at all  Social Connections: Moderately Isolated   Frequency of Communication with Friends and Family: More than three times a week   Frequency of Social Gatherings with Friends and Family: More than three times a week   Attends Religious Services: 1 to 4 times per year   Active  Member of [employer] or Organizations: No   Attends Archivist Meetings: Never   Marital Status: Widowed  Human resources officer Violence: Not At Risk   Fear of Current or Ex-Partner: No   Emotionally Abused: No   Physically Abused: No   Sexually Abused: No    Outpatient Medications Prior to Visit  Medication Sig Dispense Refill   b complex vitamins tablet Take 1 tablet by mouth daily.     Calcium Carbonate-Vitamin D 600-400 MG-UNIT tablet Take 1 tablet by mouth daily.     clorazepate (TRANXENE) 15 MG tablet 1 po qhs prn 30 tablet 1   cromolyn (NASALCROM) 5.2 MG/ACT nasal spray Place 2 sprays into the nose 2 (two) times daily.     ELIQUIS 5 MG TABS tablet TAKE 1 TABLET BY MOUTH TWICE A DAY 180 tablet 1   fenofibrate 160 MG tablet TAKE 1 TABLET BY MOUTH EVERY DAY 90 tablet 1   fish oil-omega-3 fatty acids 1000 MG capsule Take 4 g by mouth daily.     lisinopril (ZESTRIL) 20 MG tablet TAKE 1 TABLET BY MOUTH EVERY DAY 90 tablet 3   metoprolol (TOPROL-XL) 200 MG 24 hr tablet TAKE 1/2 TABLET BY MOUTH DAILY 45 tablet 3   Multiple Vitamins-Minerals (MULTIVITAMIN PO) Take 1 tablet by mouth daily.     Multiple Vitamins-Minerals (PRESERVISION AREDS 2) CAPS 2 po qd (Patient taking differently: Take 2 capsules by mouth daily.)     simvastatin (ZOCOR) 20 MG tablet TAKE 1 TABLET BY MOUTH EVERYDAY AT BEDTIME 90 tablet 1   vitamin C (ASCORBIC ACID) 500 MG tablet Take 500 mg by mouth daily.     doxazosin (CARDURA) 4 MG tablet TAKE 1 TABLET BY MOUTH AT BEDTIME. 90 tablet 1   hydrochlorothiazide (MICROZIDE) 12.5 MG capsule TAKE 1 CAPSULE (12.5 MG TOTAL) BY MOUTH AS NEEDED (SWELLING). 90 capsule 1   No facility-administered medications prior to visit.    Allergies  Allergen Reactions   Sulfonamide Derivatives Other (See Comments)    Unknown    Review of Systems  Constitutional:  Negative for fever and malaise/fatigue.  HENT:  Negative for congestion.   Eyes:  Negative for blurred vision.   Respiratory:  Negative for shortness of breath.   Cardiovascular:  Positive for leg swelling. Negative for chest pain and palpitations.  Gastrointestinal:  Negative for abdominal pain, blood in stool and nausea.  Genitourinary:  Negative for dysuria and frequency.  Musculoskeletal:  Negative for falls.  Skin:  Negative for rash.  Neurological:  Negative for dizziness, loss of consciousness and headaches.  Endo/Heme/Allergies:  Negative  for environmental allergies.  Psychiatric/Behavioral:  Negative for depression. The patient is not nervous/anxious.       Objective:    Physical Exam Vitals and nursing note reviewed.  Constitutional:      General: He is not in acute distress.    Appearance: Normal appearance. He is not ill-appearing.  HENT:     Head: Normocephalic and atraumatic.     Right Ear: External ear normal.     Left Ear: External ear normal.  Eyes:     Extraocular Movements: Extraocular movements intact.     Pupils: Pupils are equal, round, and reactive to light.  Cardiovascular:     Rate and Rhythm: Normal rate and regular rhythm.     Heart sounds: Normal heart sounds. No murmur heard.   No gallop.  Pulmonary:     Effort: Pulmonary effort is normal. No respiratory distress.     Breath sounds: Normal breath sounds. No wheezing or rales.  Musculoskeletal:     Right lower leg: 1+ Pitting Edema present.     Left lower leg: 1+ Pitting Edema present.  Skin:    General: Skin is warm and dry.  Neurological:     Mental Status: He is alert and oriented to person, place, and time.  Psychiatric:        Behavior: Behavior normal.    BP 128/84 (BP Location: Right Arm, Patient Position: Sitting, Cuff Size: Normal)   Pulse 73   Temp 98.4 F (36.9 C) (Oral)   Resp 18   Ht '5\' 8"'$  (1.727 m)   Wt 166 lb (75.3 kg)   SpO2 98%   BMI 25.24 kg/m  Wt Readings from Last 3 Encounters:  07/28/21 166 lb (75.3 kg)  04/01/21 159 lb (72.1 kg)  03/31/21 160 lb 3.2 oz (72.7 kg)     Diabetic Foot Exam - Simple   No data filed    Lab Results  Component Value Date   WBC 6.4 03/30/2021   HGB 13.7 03/30/2021   HCT 40.1 03/30/2021   PLT 144 (L) 03/30/2021   GLUCOSE 88 02/07/2021   CHOL 98 02/07/2021   TRIG 69.0 02/07/2021   HDL 44.80 02/07/2021   LDLCALC 39 02/07/2021   ALT 19 02/07/2021   AST 25 02/07/2021   NA 138 02/07/2021   K 4.5 02/07/2021   CL 103 02/07/2021   CREATININE 1.20 02/07/2021   BUN 21 02/07/2021   CO2 30 02/07/2021   TSH 1.830 01/25/2018   PSA 0.35 12/27/2009   INR 1.2 02/27/2018   HGBA1C 5.0 05/21/2012   MICROALBUR <0.7 02/07/2021    Lab Results  Component Value Date   TSH 1.830 01/25/2018   Lab Results  Component Value Date   WBC 6.4 03/30/2021   HGB 13.7 03/30/2021   HCT 40.1 03/30/2021   MCV 94.4 03/30/2021   PLT 144 (L) 03/30/2021   Lab Results  Component Value Date   NA 138 02/07/2021   K 4.5 02/07/2021   CO2 30 02/07/2021   GLUCOSE 88 02/07/2021   BUN 21 02/07/2021   CREATININE 1.20 02/07/2021   BILITOT 0.8 02/07/2021   ALKPHOS 30 (L) 02/07/2021   AST 25 02/07/2021   ALT 19 02/07/2021   PROT 6.0 02/07/2021   ALBUMIN 3.9 02/07/2021   CALCIUM 8.8 02/07/2021   GFR 53.20 (L) 02/07/2021   Lab Results  Component Value Date   CHOL 98 02/07/2021   Lab Results  Component Value Date   HDL 44.80 02/07/2021  Lab Results  Component Value Date   LDLCALC 39 02/07/2021   Lab Results  Component Value Date   TRIG 69.0 02/07/2021   Lab Results  Component Value Date   CHOLHDL 2 02/07/2021   Lab Results  Component Value Date   HGBA1C 5.0 05/21/2012       Assessment & Plan:   Problem List Items Addressed This Visit       Unprioritized   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 eye with active choroidal neovascularization ([place])   Relevant Medications   doxazosin (CARDURA) 4 MG tablet   Abnormal platelets ([facility])   Relevant Orders   CBC with Differential/Platelet   Essential hypertension     Well controlled, no changes to meds. Encouraged heart healthy diet such as the DASH diet and exercise as tolerated.        Relevant Medications   doxazosin (CARDURA) 4 MG tablet   Hyperlipidemia - Primary    Encourage heart healthy diet such as MIND or DASH diet, increase exercise, avoid trans fats, simple carbohydrates and processed foods, consider a krill or fish or flaxseed oil cap daily.        Relevant Medications   doxazosin (CARDURA) 4 MG tablet   Other Relevant Orders   Lipid panel   Comprehensive metabolic panel   Prostate [facility])   Other Visit Diagnoses     Long-term use of high-risk medication       Relevant Orders   Drug Monitoring Panel [id number] , Urine        Meds ordered this encounter  Medications   doxazosin (CARDURA) 4 MG tablet    Sig: Take 1 tablet (4 mg total) by mouth at bedtime.    Dispense:  90 tablet    Refill:  1    I, Dr. Roma Schanz, DO, personally preformed the services described in this documentation.  All medical record entries made by the scribe were at my direction and in my presence.  I have reviewed the chart and discharge instructions (if applicable) and agree that the record reflects my personal performance and is accurate and complete. 07/28/2021   I,Mark Stark,acting as a scribe for Mark Held, DO.,have documented all relevant documentation on the behalf of Mark Held, DO,as directed by  Mark Held, DO while in the presence of Mark Held, DO.   Mark Held, DO

## 2021-07-28 NOTE — Patient Instructions (Signed)
https://www.nhlbi.nih.gov/files/docs/public/heart/dash_brief.pdf">  DASH Eating Plan DASH stands for Dietary Approaches to Stop Hypertension. The DASH eating plan is a healthy eating plan that has been shown to: Reduce high blood pressure (hypertension). Reduce your risk for type 2 diabetes, heart disease, and stroke. Help with weight loss. What are tips for following this plan? Reading food labels Check food labels for the amount of salt (sodium) per serving. Choose foods with less than 5 percent of the Daily Value of sodium. Generally, foods with less than 300 milligrams (mg) of sodium per serving fit into this eating plan. To find whole grains, look for the word "whole" as the first word in the ingredient list. Shopping Buy products labeled as "low-sodium" or "no salt added." Buy fresh foods. Avoid canned foods and pre-made or frozen meals. Cooking Avoid adding salt when cooking. Use salt-free seasonings or herbs instead of table salt or sea salt. Check with your health care provider or pharmacist before using salt substitutes. Do not fry foods. Cook foods using healthy methods such as baking, boiling, grilling, roasting, and broiling instead. Cook with heart-healthy oils, such as olive, canola, avocado, soybean, or sunflower oil. Meal planning  Eat a balanced diet that includes: 4 or more servings of fruits and 4 or more servings of vegetables each day. Try to fill one-half of your plate with fruits and vegetables. 6-8 servings of whole grains each day. Less than 6 oz (170 g) of lean meat, poultry, or fish each day. A 3-oz (85-g) serving of meat is about the same size as a deck of cards. One egg equals 1 oz (28 g). 2-3 servings of low-fat dairy each day. One serving is 1 cup (237 mL). 1 serving of nuts, seeds, or beans 5 times each week. 2-3 servings of heart-healthy fats. Healthy fats called omega-3 fatty acids are found in foods such as walnuts, flaxseeds, fortified milks, and eggs.  These fats are also found in cold-water fish, such as sardines, salmon, and mackerel. Limit how much you eat of: Canned or prepackaged foods. Food that is high in trans fat, such as some fried foods. Food that is high in saturated fat, such as fatty meat. Desserts and other sweets, sugary drinks, and other foods with added sugar. Full-fat dairy products. Do not salt foods before eating. Do not eat more than 4 egg yolks a week. Try to eat at least 2 vegetarian meals a week. Eat more home-cooked food and less restaurant, buffet, and fast food.  Lifestyle When eating at a restaurant, ask that your food be prepared with less salt or no salt, if possible. If you drink alcohol: Limit how much you use to: 0-1 drink a day for women who are not pregnant. 0-2 drinks a day for men. Be aware of how much alcohol is in your drink. In the [place] one drink equals one 12 oz bottle of beer (355 mL), one 5 oz glass of wine (148 mL), or one 1 oz glass of hard liquor (44 mL). General information Avoid eating more than 2,300 mg of salt a day. If you have hypertension, you may need to reduce your sodium intake to 1,500 mg a day. Work with your health care provider to maintain a healthy body weight or to lose weight. Ask what an ideal weight is for you. Get at least 30 minutes of exercise that causes your heart to beat faster (aerobic exercise) most days of the week. Activities may include walking, swimming, or biking. Work with your health care provider   or dietitian to adjust your eating plan to your individual calorie needs. What foods should I eat? Fruits All fresh, dried, or frozen fruit. Canned fruit in natural juice (without addedsugar). Vegetables Fresh or frozen vegetables (raw, steamed, roasted, or grilled). Low-sodium or reduced-sodium tomato and vegetable juice. Low-sodium or reduced-sodium tomatosauce and tomato paste. Low-sodium or reduced-sodium canned vegetables. Grains Whole-grain or  whole-wheat bread. Whole-grain or whole-wheat pasta. Brown rice. Oatmeal. Quinoa. Bulgur. Whole-grain and low-sodium cereals. Pita bread.Low-fat, low-sodium crackers. Whole-wheat flour tortillas. Meats and other proteins Skinless chicken or turkey. Ground chicken or turkey. Pork with fat trimmed off. Fish and seafood. Egg whites. Dried beans, peas, or lentils. Unsalted nuts, nut butters, and seeds. Unsalted canned beans. Lean cuts of beef with fat trimmed off. Low-sodium, lean precooked or cured meat, such as sausages or meatloaves. Dairy Low-fat (1%) or fat-free (skim) milk. Reduced-fat, low-fat, or fat-free cheeses. Nonfat, low-sodium ricotta or cottage cheese. Low-fat or nonfatyogurt. Low-fat, low-sodium cheese. Fats and oils Soft margarine without trans fats. Vegetable oil. Reduced-fat, low-fat, or light mayonnaise and salad dressings (reduced-sodium). Canola, safflower, olive, avocado, soybean, andsunflower oils. Avocado. Seasonings and condiments Herbs. Spices. Seasoning mixes without salt. Other foods Unsalted popcorn and pretzels. Fat-free sweets. The items listed above may not be a complete list of foods and beverages you can eat. Contact a dietitian for more information. What foods should I avoid? Fruits Canned fruit in a light or heavy syrup. Fried fruit. Fruit in cream or buttersauce. Vegetables Creamed or fried vegetables. Vegetables in a cheese sauce. Regular canned vegetables (not low-sodium or reduced-sodium). Regular canned tomato sauce and paste (not low-sodium or reduced-sodium). Regular tomato and vegetable juice(not low-sodium or reduced-sodium). Pickles. Olives. Grains Baked goods made with fat, such as croissants, muffins, or some breads. Drypasta or rice meal packs. Meats and other proteins Fatty cuts of meat. Ribs. Fried meat. Bacon. Bologna, salami, and other precooked or cured meats, such as sausages or meat loaves. Fat from the back of a pig (fatback). Bratwurst.  Salted nuts and seeds. Canned beans with added salt. Canned orsmoked fish. Whole eggs or egg yolks. Chicken or turkey with skin. Dairy Whole or 2% milk, cream, and half-and-half. Whole or full-fat cream cheese. Whole-fat or sweetened yogurt. Full-fat cheese. Nondairy creamers. Whippedtoppings. Processed cheese and cheese spreads. Fats and oils Butter. Stick margarine. Lard. Shortening. Ghee. Bacon fat. Tropical oils, suchas coconut, palm kernel, or palm oil. Seasonings and condiments Onion salt, garlic salt, seasoned salt, table salt, and sea salt. Worcestershire sauce. Tartar sauce. Barbecue sauce. Teriyaki sauce. Soy sauce, including reduced-sodium. Steak sauce. Canned and packaged gravies. Fish sauce. Oyster sauce. Cocktail sauce. Store-bought horseradish. Ketchup. Mustard. Meat flavorings and tenderizers. Bouillon cubes. Hot sauces. Pre-made or packaged marinades. Pre-made or packaged taco seasonings. Relishes. Regular saladdressings. Other foods Salted popcorn and pretzels. The items listed above may not be a complete list of foods and beverages you should avoid. Contact a dietitian for more information. Where to find more information National Heart, Lung, and Blood Institute: www.nhlbi.nih.gov American Heart Association: www.heart.org Academy of Nutrition and Dietetics: www.eatright.org National Kidney Foundation: www.kidney.org Summary The DASH eating plan is a healthy eating plan that has been shown to reduce high blood pressure (hypertension). It may also reduce your risk for type 2 diabetes, heart disease, and stroke. When on the DASH eating plan, aim to eat more fresh fruits and vegetables, whole grains, lean proteins, low-fat dairy, and heart-healthy fats. With the DASH eating plan, you should limit salt (sodium) intake to 2,300   mg a day. If you have hypertension, you may need to reduce your sodium intake to 1,500 mg a day. Work with your health care provider or dietitian to adjust  your eating plan to your individual calorie needs. This information is not intended to replace advice given to you by your health care provider. Make sure you discuss any questions you have with your healthcare provider. Document Revised: 11/14/2019 Document Reviewed: 11/14/2019 Elsevier Patient Education  2022 Elsevier Inc.  

## 2021-07-28 NOTE — Assessment & Plan Note (Signed)
Encourage heart healthy diet such as MIND or DASH diet, increase exercise, avoid trans fats, simple carbohydrates and processed foods, consider a krill or fish or flaxseed oil cap daily.  °

## 2021-07-29 LAB — COMPREHENSIVE METABOLIC PANEL
ALT: 22 U/L (ref 0–53)
AST: 37 U/L (ref 0–37)
Albumin: 4.1 g/dL (ref 3.5–5.2)
Alkaline Phosphatase: 30 U/L — ABNORMAL LOW (ref 39–117)
BUN: 20 mg/dL (ref 6–23)
CO2: 28 mEq/L (ref 19–32)
Calcium: 8.7 mg/dL (ref 8.4–10.5)
Chloride: 100 mEq/L (ref 96–112)
Creatinine, Ser: 1.13 mg/dL (ref 0.40–1.50)
GFR: 57 mL/min — ABNORMAL LOW (ref >60.00)
Glucose, Bld: 86 mg/dL (ref 70–99)
Potassium: 4.6 mEq/L (ref 3.5–5.1)
Sodium: 135 mEq/L (ref 135–145)
Total Bilirubin: 1 mg/dL (ref 0.2–1.2)
Total Protein: 6 g/dL (ref 6.0–8.3)

## 2021-07-29 LAB — CBC WITH DIFFERENTIAL/PLATELET
Basophils Absolute: 0.1 10*3/uL (ref 0.0–0.1)
Basophils Relative: 1 % (ref 0.0–3.0)
Eosinophils Absolute: 0.2 10*3/uL (ref 0.0–0.7)
Eosinophils Relative: 2.4 % (ref 0.0–5.0)
HCT: 36.6 % — ABNORMAL LOW (ref 39.0–52.0)
Hemoglobin: 12.2 g/dL — ABNORMAL LOW (ref 13.0–17.0)
Lymphocytes Relative: 24.2 % (ref 12.0–46.0)
Lymphs Abs: 1.8 10*3/uL (ref 0.7–4.0)
MCHC: 33.4 g/dL (ref 30.0–36.0)
MCV: 96 fl (ref 78.0–100.0)
Monocytes Absolute: 1 10*3/uL (ref 0.1–1.0)
Monocytes Relative: 13.1 % — ABNORMAL HIGH (ref 3.0–12.0)
Neutro Abs: 4.4 10*3/uL (ref 1.4–7.7)
Neutrophils Relative %: 59.3 % (ref 43.0–77.0)
Platelets: 150 10*3/uL (ref 150.0–400.0)
RBC: 3.81 Mil/uL — ABNORMAL LOW (ref 4.22–5.81)
RDW: 13.3 % (ref 11.5–15.5)
WBC: 7.4 10*3/uL (ref 4.0–10.5)

## 2021-07-29 LAB — LIPID PANEL
Cholesterol: 93 mg/dL (ref 0–200)
HDL: 49 mg/dL (ref >39.00)
LDL Cholesterol: 34 mg/dL (ref 0–99)
NonHDL: 44.27
Total CHOL/HDL Ratio: 2
Triglycerides: 50 mg/dL (ref 0.0–149.0)
VLDL: 10 mg/dL (ref 0.0–40.0)

## 2021-07-30 ENCOUNTER — Other Ambulatory Visit: Payer: Self-pay | Admitting: Family Medicine

## 2021-07-30 DIAGNOSIS — D649 Anemia, unspecified: Secondary | ICD-10-CM

## 2021-07-31 LAB — DRUG MONITORING PANEL 376104, URINE
Alphahydroxyalprazolam: NEGATIVE ng/mL (ref <25)
Alphahydroxymidazolam: NEGATIVE ng/mL (ref <50)
Alphahydroxytriazolam: NEGATIVE ng/mL (ref <50)
Aminoclonazepam: NEGATIVE ng/mL (ref <25)
Amphetamines: NEGATIVE ng/mL (ref <500)
Barbiturates: NEGATIVE ng/mL (ref <300)
Benzodiazepines: POSITIVE ng/mL — AB (ref <100)
Cocaine Metabolite: NEGATIVE ng/mL (ref <150)
Desmethyltramadol: NEGATIVE ng/mL (ref <100)
Hydroxyethylflurazepam: NEGATIVE ng/mL (ref <50)
Lorazepam: NEGATIVE ng/mL (ref <50)
Nordiazepam: NEGATIVE ng/mL (ref <50)
Opiates: NEGATIVE ng/mL (ref <100)
Oxazepam: 776 ng/mL — ABNORMAL HIGH (ref <50)
Oxycodone: NEGATIVE ng/mL (ref <100)
Temazepam: NEGATIVE ng/mL (ref <50)
Tramadol: NEGATIVE ng/mL (ref <100)

## 2021-07-31 LAB — DM TEMPLATE

## 2021-08-08 ENCOUNTER — Ambulatory Visit: Payer: Medicare Other | Admitting: Family Medicine

## 2021-08-09 ENCOUNTER — Telehealth: Payer: Self-pay | Admitting: Cardiovascular Disease

## 2021-08-09 NOTE — Telephone Encounter (Signed)
*  STAT* If patient is at the pharmacy, call can be transferred to refill team.   1. Which medications need to be refilled? (please list name of each medication and dose if known)  hydrochlorothiazide (MICROZIDE) 12.5 MG capsule (Expired)  2. Which pharmacy/location (including street and city if local pharmacy) is medication to be sent to?  [id number] [place], [place] - [address]  3. Do they need a 30 day or 90 day supply? 90 day supply

## 2021-08-15 ENCOUNTER — Ambulatory Visit (INDEPENDENT_AMBULATORY_CARE_PROVIDER_SITE_OTHER): Payer: Medicare Other

## 2021-08-15 DIAGNOSIS — I4819 Other persistent atrial fibrillation: Secondary | ICD-10-CM

## 2021-08-15 DIAGNOSIS — I10 Essential (primary) hypertension: Secondary | ICD-10-CM

## 2021-08-15 DIAGNOSIS — E785 Hyperlipidemia, unspecified: Secondary | ICD-10-CM

## 2021-08-15 MED ORDER — HYDROCHLOROTHIAZIDE 12.5 MG PO CAPS: 12.5000 mg | ORAL_CAPSULE | ORAL | 1 refills | Status: DC | PRN

## 2021-08-15 NOTE — Patient Instructions (Signed)
Visit Information  PATIENT GOALS:  Goals Addressed             This Visit's Progress    Maintain heart health   On track    Timeframe:  Long-Range Goal Priority:  Medium Start Date:  04/05/2021                          Expected End Date:  12/08/21                    Follow Up Date 11/08/21    Patient Goals: Continue to take medications as prescribed Continue to attend scheduled provider appointments Call provider office for new concerns, questions, or BP outside parameters Continue to follow a heart healthy/Dash diet; eat plenty of: vegetables, fruits, lean protein, fish, non-tropical vegetable oil. Limit red and processed meats, sodium and sugar-sweetened foods and drinks. Continue to monitor blood pressure as recommended by provider and record. Continue exercise and/or maintain established activity routine.    Why is this important?   You won't feel high blood pressure, but it can still hurt your blood vessels.  can cause heart or kidney problems. It can also cause a stroke.  Making lifestyle changes like losing a little weight or eating less salt will help.  Checking your blood pressure at home and at different times of the day can help to control blood pressure.  If the doctor prescribes medicine remember to take it the way the doctor ordered.  Call the office if you cannot afford the medicine or if there are questions about it.       Maintain My Quality of Life   On track    Timeframe:  Long-Range Goal Priority:  Medium Start Date:  04/05/21                           Expected End Date:  12/08/21                     Follow Up Date: 11/08/21    Patient Goals/Self-Care Activities Continue to take medications as prescribed Continue to attend scheduled provider appointments.  Continue to follow up with your provider office for new concerns or questions Continue to work with [facility] to address care coordination needs and/or clinical team to address health care and disease  management related needs.         The patient verbalized understanding of instructions, educational materials, and care plan provided today and declined offer to receive copy of patient instructions, educational materials, and care plan.   The patient has been provided with contact information for the care management team and has been advised to call with any health related questions or concerns.  The care management team will reach out to the patient again over the next 90 days.   Thea Silversmith, RN, MSN, BSN, CCM Care Management Coordinator [facility] [phone]

## 2021-08-15 NOTE — Telephone Encounter (Signed)
Denton Brick, the patient's Nurse Case Manager called. She wanted to make sure Dr. Claiborne Billings is aware that the patient is taken every day now instead of as needed.

## 2021-08-15 NOTE — Chronic Care Management (AMB) (Signed)
Chronic Care Management   CCM RN Visit Note  08/15/2021 Name: Mark Stark. MRN: [id number] DOB: 06-05-1928  Subjective: Mark Stark. is a 87 y.o. year old male who is a primary care patient of [name], DO. The care management team was consulted for assistance with disease management and care coordination needs.    Engaged with patient by telephone for follow up visit in response to provider referral for case management and/or care coordination services.   Consent to Services:  The patient was given information about Chronic Care Management services, agreed to services, and gave verbal consent prior to initiation of services.  Please see initial visit note for detailed documentation.   Patient agreed to services and verbal consent obtained.   Assessment: Review of patient past medical history, allergies, medications, health status, including review of consultants reports, laboratory and other test data, was performed as part of comprehensive evaluation and provision of chronic care management services.   SDOH (Social Determinants of Health) assessments and interventions performed:    CCM Care Plan  Allergies  Allergen Reactions   Sulfonamide Derivatives Other (See Comments)    Unknown    Outpatient Encounter Medications as of 08/15/2021  Medication Sig   b complex vitamins tablet Take 1 tablet by mouth daily.   Calcium Carbonate-Vitamin D 600-400 MG-UNIT tablet Take 1 tablet by mouth daily.   clorazepate (TRANXENE) 15 MG tablet 1 po qhs prn   cromolyn (NASALCROM) 5.2 MG/ACT nasal spray Place 2 sprays into the nose 2 (two) times daily.   doxazosin (CARDURA) 4 MG tablet Take 1 tablet (4 mg total) by mouth at bedtime.   ELIQUIS 5 MG TABS tablet TAKE 1 TABLET BY MOUTH TWICE A DAY   fenofibrate 160 MG tablet TAKE 1 TABLET BY MOUTH EVERY DAY   fish oil-omega-3 fatty acids 1000 MG capsule Take 4 g by mouth daily.   lisinopril (ZESTRIL) 20 MG tablet TAKE 1 TABLET BY  MOUTH EVERY DAY   metoprolol (TOPROL-XL) 200 MG 24 hr tablet TAKE 1/2 TABLET BY MOUTH DAILY   Multiple Vitamins-Minerals (MULTIVITAMIN PO) Take 1 tablet by mouth daily.   Multiple Vitamins-Minerals (PRESERVISION AREDS 2) CAPS 2 po qd (Patient taking differently: Take 2 capsules by mouth daily.)   simvastatin (ZOCOR) 20 MG tablet TAKE 1 TABLET BY MOUTH EVERYDAY AT BEDTIME   vitamin C (ASCORBIC ACID) 500 MG tablet Take 500 mg by mouth daily.   [DISCONTINUED] hydrochlorothiazide (MICROZIDE) 12.5 MG capsule TAKE 1 CAPSULE (12.5 MG TOTAL) BY MOUTH AS NEEDED (SWELLING).   No facility-administered encounter medications on file as of 08/15/2021.    Patient Active Problem List   Diagnosis Date Noted   Prostate CA ([place]) 07/28/2021   Thrombocytopenia ([place]) 03/30/2021   Screening for HIV (human immunodeficiency virus) 03/30/2021   Monocytosis 03/30/2021   Abnormal platelets ([place]) 03/28/2021   Hip pain 03/28/2021   Persistent atrial fibrillation ([facility])    Paroxysmal atrial fibrillation ([place]) 01/12/2018   Cystoid macular edema of right eye 11/29/2017   Macular degeneration 12/21/2015   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 eye with active choroidal neovascularization ([place]) 04/30/2014   Myalgia and myositis 11/16/2013   Epiretinal membrane 11/07/2012   Posterior vitreous detachment 11/07/2012   Status post intraocular lens implant 11/07/2012   Fungal ear infection 04/09/2012   Abnormal laboratory test 03/27/2011   AI (aortic insufficiency) 123456   SYSTOLIC MURMUR 123456   EDEMA 12/05/2010   ECCHYMOSES, SPONTANEOUS 11/28/2010   CONTUSION,  RIGHT THIGH 11/28/2010   DECREASED HEARING, BILATERAL 09/23/2010   Pain in limb 07/28/2009   ANKLE SPRAIN, LEFT 07/28/2009   CONTUSION, LOWER LEG, LEFT 07/28/2009   DERMATITIS, CONTACT, NOS 10/02/2007   Essential hypertension 05/07/2007   PROSTATE CANCER, HX OF 03/13/2007   Hyperlipidemia 01/15/2007   Anxiety state 01/15/2007    DIVERTICULITIS, HX OF 01/15/2007    Conditions to be addressed/monitored:HTN and HLD  Care Plan : Quality of Life  Updates made by Luretha Rued, RN since 08/15/2021 12:00 AM     Problem: Quality of Life   Priority: Medium     Long-Range Goal: Quality of Life Maintained   Start Date: 04/04/2021  Expected End Date: 10/05/2021  This Visit's Progress: On track  Recent Progress: On track  Priority: Medium  Note:   Current Barriers:  Lack of Long Term care plan to maintain quality of life. Client reports "I feel fine". He continues to be active mowing the yard, exercises 6 days/week. Denies any difficulty getting to provider appointment or affording medications. Reports continues to attend opthalmology appointment for injections for macular degeneration and states it has stabilized and even improved some. He reports left eye better than right-good vision in left eye. Denies any health difficulty at this time. Nurse Case Manager Clinical Goal(s):  patient will verbalize understanding of plan for continued self management of health the patient will demonstrate ongoing self health care management ability as evidenced by attending provider visits, taking medications as prescribed, continuing activities that encourage fulfullment in his life such as walking, gardening, outside activites. Interventions:  1:1 collaboration with [name], [name], DO regarding development and update of comprehensive plan of care as evidenced by provider attestation and co-signature Inter-disciplinary care team collaboration (see longitudinal plan of care) Evaluation of current treatment plan related to chronic disease processes and patient's adherence to plan as established by provider. Discussed meal planning Encouraged client to continue to be as active as possible per provider recommendation Reviewed upcoming provider appointments. Discussed plans with patient for ongoing care management follow up and  provided patient with direct contact information for care management team Provided positive feedback regarding self management of health Patient Goals/Self-Care Activities Continue to take medications as prescribed Continue to attend scheduled provider appointments.  Continue to follow up with your provider office for new concerns or questions Continue to work with [facility] to address care coordination needs and/or clinical team to address health care and disease management related needs.   Follow Up Plan: Telephone follow up appointment with care management team member scheduled for: 11/08/21 The patient has been provided with contact information for the care management team and has been advised to call with any health related questions or concerns.        Care Plan : Cardiovascular disease  Updates made by Luretha Rued, RN since 08/15/2021 12:00 AM     Problem: Cardiovascular disease   Priority: Medium     Long-Range Goal: Disease Progression Prevented or Minimized   Start Date: 04/05/2021  Expected End Date: 12/08/2021  This Visit's Progress: On track  Recent Progress: On track  Priority: Medium  Note:   Objective:  Last practice recorded BP readings:  BP Readings from Last 3 Encounters:  07/28/21 128/84  04/01/21 104/72  03/31/21 (!) 151/58  Most recent lipid panel:     Component Value Date/Time   CHOL 93 07/28/2021 1432   TRIG 50.0 07/28/2021 1432   HDL 49.00 07/28/2021 1432   CHOLHDL 2  07/28/2021 1432   VLDL 10.0 07/28/2021 1432   LDLCALC 34 07/28/2021 1432   LDLCALC 32 01/11/2018 1454  Current Barriers:  Long term plan of care needed for management of cardiovascular disease in a patient with history of atrial fibrillation, hypertension, aortic valve insufficiency, hyperlipidemia. Client reports, "I feel fine". Continues to remain active, mowing lawn, he reports he exercises 6 days a week. Per client, home blood pressure readings: on 8/22 131/67 HR 56; 8/21 131/64 HR  55; 8/20 133/63 with HR 58 and 123/62 with HR 55.  Client reports primary concern is refill for Hydrochlorothiazide. Client reports previously ordered prn for ankle swelling. He reports his has been using every day and he feels this has helped with ankle swelling. Stated he called cardiologist office requesting a refill last week and that as of today his pharmacy has not received a refill.  Case Manager Clinical Goal(s):  patient will verbalize understanding of plan for management of cardiovascular disease patient will attend all scheduled medical appointments. patient will verbalize basic understanding of cardiovascular disease process and self health management plan as evidence by taking medications as prescribed, blood pressure maintained per provider recommendations. Interventions:  Collaboration with [name], [name], DO regarding development and update of comprehensive plan of care as evidenced by provider attestation and co-signature Inter-disciplinary care team collaboration (see longitudinal plan of care) Evaluation of current treatment plan related to hypertension/cardiovascular disease self management and patient's adherence to plan as established by provider. Reviewed scheduled/upcoming provider appointments.  Medications reviewed with client.  Discussed with client that cardiologist may need to see client prior to refilling as he is using daily now versus as needed (previous prescription), and previous prescription has expired.  RNCM called Cardiologist office - updated that client has been using hydrochlorothiazide daily every day versus as needed (previous prescription); client called last week requesting a refill. RNCM request follow up on refill request. Discussed meal planning. Discussed ongoing care management follow up and provided client with direct contact information and encouraged client to  call anytime as needed. Provided positive feedback regarding self-management of  health. Self-Care Activities: Self administers medications as prescribed Attends all scheduled provider appointments Calls provider office for new concerns, questions, or BP outside discussed parameters Checks BP and records as discussed Follows a Heart healthy/DASH diet Patient Goals: Continue to take medications as prescribed Continue to attend scheduled provider appointments Call provider office for new concerns, questions, or BP outside parameters Continue to follow a heart healthy/Dash diet; eat plenty of: vegetables, fruits, lean protein, fish, non-tropical vegetable oil. Limit red and processed meats, sodium and sugar-sweetened foods and drinks. Continue to monitor blood pressure as recommended by provider and record. Continue exercise and/or maintain established activity routine. Follow Up Plan: Telephone follow up appointment with care management team member scheduled for: 11/08/21 The patient has been provided with contact information for the care management team and has been advised to call with any health related questions or concerns.     Plan:The patient has been provided with contact information for the care management team and has been advised to call with any health related questions or concerns.  and The care management team will reach out to the patient again over the next 90 days.  Thea Silversmith, RN, MSN, BSN, CCM Care Management Coordinator [facility] [phone]

## 2021-08-15 NOTE — Telephone Encounter (Signed)
Pt is calling still has not received his medication when the request was put in on 8/16.pt also has his BP readings 131/67 HR 56 131/64 HR 55 133/63 HR 58 123/62 HR 55

## 2021-08-15 NOTE — Telephone Encounter (Signed)
Pt states he use to take HCTZ PRN but began noticing swelling in his ankles about a month and half ago.  Started taking his HCTZ daily and swelling has diminished.  BP has been 123-133/60s, HR 50s.  Pt states he feels great.  Had a CMP 8/4 that showed Creatinine 1.13, BUN 20, K+ 4.6.  Denies increased salt in diet.  Advised I will update Dr. Claiborne Billings and let him know that pt has been taking his diuretic daily.

## 2021-08-15 NOTE — Addendum Note (Signed)
Addended by: Wonda Horner on: 08/15/2021 12:28 PM   Modules accepted: Orders

## 2021-08-19 ENCOUNTER — Other Ambulatory Visit: Payer: Self-pay | Admitting: Family Medicine

## 2021-08-19 ENCOUNTER — Telehealth: Payer: Self-pay | Admitting: Cardiovascular Disease

## 2021-08-19 DIAGNOSIS — F411 Generalized anxiety disorder: Secondary | ICD-10-CM

## 2021-08-19 MED ORDER — CLORAZEPATE DIPOTASSIUM 15 MG PO TABS: ORAL_TABLET | ORAL | 1 refills | Status: DC

## 2021-08-19 NOTE — Telephone Encounter (Signed)
Called pt back in regards to irregular HR.  He reports that 2 days ago he developed a strange feeling.  He describes it as how he felt when he was previously in AF; a slight weak feeling.  He reports his home monitor showed irregular rhythm.  BP not of concern; BP documented in call intake.  Pt reports he has not missed any doses of eliquis.  He thinks he may have overexerted himself while working in his garden.  He also reports he did not consume fluids while out working.  I suggested that he take water outside with him and leave it in view so he will remember to drink.  He is agreeable to this.  He informed me that he was in the middle of fast walking when I called, no SOB noted during call.  He is able to push mow his yard and do aerobic walking 6 days a week without difficulty.  He would like to know if he is back in Afib.  I will route message to MD to address.

## 2021-08-19 NOTE — Telephone Encounter (Signed)
ok 

## 2021-08-19 NOTE — Telephone Encounter (Signed)
Requesting: Tran[employer] Contract: 07/28/21 UDS: 07/28/21 Last OV: 07/28/21 Next OV: 02/06/22 Last Refill: 06/23/21, #30--1 RF Database:   Please advise

## 2021-08-19 NOTE — Telephone Encounter (Signed)
Patient c/o Palpitations:  High priority if patient c/o lightheadedness, shortness of breath, or chest pain  How long have you had palpitations/irregular HR/ Afib? Are you having the symptoms now? Started yesterday   Are you currently experiencing lightheadedness, SOB or CP?  Do you have a history of afib (atrial fibrillation) or irregular heart rhythm? Yes. Patient had an ablation a few years ago   Have you checked your BP or HR? (document readings if available):  08/18/21  8:00 am: 110/65 HR 68   11:00 am: 101/59 HR 68  11:45 am: 97/64 HR 71  12:10 pm: 105/64 HR 84 12:30 pm: 110/64 HR 81 1:35 pm: 97/56 HR 58  2:45 pm: 111/59 HR 63  7:00 pm: 121/69 HR 75 8:35 pm: 111/55 HR 65  08/19/21 9:30 am: 108/61 HR 67   Are you experiencing any other symptoms? Fatigue but not total exhaustion. Patient states he is still bale to get in his regular activities but he can

## 2021-08-22 NOTE — Telephone Encounter (Signed)
Spoke with pt and has been in afib since last week and notes not feeling like can get "big breath" and also visual disturbance "hazy look" that comes and goes  Appt made with Adline Peals PA in the afib clinic for tom 08/23/21 at 2:30 pm Will forward to Dr Claiborne Billings for review and recommendations ./cy

## 2021-08-22 NOTE — Telephone Encounter (Signed)
Follow Up:    Pt is calling back from Friday. Patient says he is still in Afib.   Patient c/o Palpitations:  High priority if patient c/o lightheadedness, shortness of breath, or chest pain  How long have you had palpitations/irregular HR/ Afib? Are you having the symptoms now? Yes still in Afib- started last Thursday  Are you currently experiencing lightheadedness, SOB or CP? Trouble catching a deep breath also vision disturbance  Do you have a history of afib (atrial fibrillation) or irregular heart rhythm? tes  Have you checked your BP or HR? (document readings if available):  lower than normal  Are you experiencing any other symptoms? no

## 2021-08-23 ENCOUNTER — Encounter (HOSPITAL_COMMUNITY): Payer: Self-pay | Admitting: Physician Assistant

## 2021-08-23 ENCOUNTER — Ambulatory Visit (HOSPITAL_COMMUNITY)
Admission: RE | Admit: 2021-08-23 | Discharge: 2021-08-23 | Disposition: A | Discharge status: Home / Self Care | Payer: Medicare Other | Source: Ambulatory Visit | Attending: Physician Assistant | Admitting: Physician Assistant

## 2021-08-23 ENCOUNTER — Other Ambulatory Visit: Payer: Self-pay

## 2021-08-23 VITALS — BP 122/62 | HR 76 | Ht 68.0 in | Wt 157.6 lb

## 2021-08-23 DIAGNOSIS — I4819 Other persistent atrial fibrillation: Secondary | ICD-10-CM | POA: Diagnosis not present

## 2021-08-23 DIAGNOSIS — D6869 Other thrombophilia: Secondary | ICD-10-CM | POA: Insufficient documentation

## 2021-08-23 DIAGNOSIS — Z86718 Personal history of other venous thrombosis and embolism: Secondary | ICD-10-CM | POA: Diagnosis not present

## 2021-08-23 DIAGNOSIS — I4892 Unspecified atrial flutter: Secondary | ICD-10-CM | POA: Diagnosis not present

## 2021-08-23 DIAGNOSIS — I10 Essential (primary) hypertension: Secondary | ICD-10-CM | POA: Insufficient documentation

## 2021-08-23 DIAGNOSIS — Z79899 Other long term (current) drug therapy: Secondary | ICD-10-CM | POA: Diagnosis not present

## 2021-08-23 DIAGNOSIS — Z7901 Long term (current) use of anticoagulants: Secondary | ICD-10-CM | POA: Diagnosis not present

## 2021-08-23 LAB — CBC
HCT: 39.7 % (ref 39.0–52.0)
Hemoglobin: 13.4 g/dL (ref 13.0–17.0)
MCH: 32.1 pg (ref 26.0–34.0)
MCHC: 33.8 g/dL (ref 30.0–36.0)
MCV: 95 fL (ref 80.0–100.0)
Platelets: 146 10*3/uL — ABNORMAL LOW (ref 150–400)
RBC: 4.18 MIL/uL — ABNORMAL LOW (ref 4.22–5.81)
RDW: 12.5 % (ref 11.5–15.5)
WBC: 6.8 10*3/uL (ref 4.0–10.5)
nRBC: 0 % (ref 0.0–0.2)

## 2021-08-23 LAB — BASIC METABOLIC PANEL
Anion gap: 7 (ref 5–15)
BUN: 19 mg/dL (ref 8–23)
CO2: 26 mmol/L (ref 22–32)
Calcium: 8.8 mg/dL — ABNORMAL LOW (ref 8.9–10.3)
Chloride: 100 mmol/L (ref 98–111)
Creatinine, Ser: 1.13 mg/dL (ref 0.61–1.24)
GFR, Estimated: >60 mL/min (ref >60)
Glucose, Bld: 100 mg/dL — ABNORMAL HIGH (ref 70–99)
Potassium: 4.4 mmol/L (ref 3.5–5.1)
Sodium: 133 mmol/L — ABNORMAL LOW (ref 135–145)

## 2021-08-23 NOTE — Progress Notes (Signed)
Primary Care Physician: [name], [name], DO Primary Cardiologist: Dr Claiborne Billings Primary Electrophysiologist: none Referring Physician: Antonieta Pert triage   Mark Stark. is a 87 y.o. male with a history of HLD, HTN, prostate cancer, inferior mesenteric vein thrombosis, atrial fibrillation who presents for consultation in the [facility].  The patient was initially diagnosed with atrial fibrillation 11/2017 and underwent DCCV on 03/01/18. Patient is on Eliquis for a CHADS2VASC score of 4. Patient has done well with no recurrence of afib until 08/18/21 when he noted an irregular heart beat. He does have some mild SOB with exertion as well. He believes his afib started when he was working out in his garden and became dehydrated.   Today, he denies symptoms of chest pain, orthopnea, PND, lower extremity edema, dizziness, presyncope, syncope, snoring, daytime somnolence, bleeding, or neurologic sequela. The patient is tolerating medications without difficulties and is otherwise without complaint today.    Atrial Fibrillation Risk Factors:  he does not have symptoms or diagnosis of sleep apnea. he does not have a history of rheumatic fever. he does not have a history of alcohol use.   he has a BMI of Body mass index is 23.96 kg/m.[name] Filed Weights   08/23/21 1427  Weight: 71.5 kg    Family History  Problem Relation Age of Onset   Dementia Mother    Alzheimer's disease Mother    Mental illness Mother        ALZ DISEASE   Cancer Brother 23       pancreatic cancer     Atrial Fibrillation Management history:  Previous antiarrhythmic drugs: none Previous cardioversions: 03/01/18 Previous ablations: none CHADS2VASC score: 4 Anticoagulation history: Eliquis   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HCC)    Prostate cancer [facility])    Past Surgical History:   Procedure Laterality Date   CARDIOVERSION N/A 03/01/2018   Procedure: CARDIOVERSION;  Surgeon: Troy Sine, MD;  Location: [place];  Service: Cardiovascular;  Laterality: N/A;   CATARACT EXTRACTION  01/27/2010   right   CATARACT EXTRACTION  03/04/2010   left   Prostate seed implants     PROSTATE SURGERY      Current Outpatient Medications  Medication Sig Dispense Refill   b complex vitamins tablet Take 1 tablet by mouth daily.     Calcium Carbonate-Vitamin D 600-400 MG-UNIT tablet Take 1 tablet by mouth daily.     clorazepate (TRANXENE) 15 MG tablet TAKE 1 TABLET BY MOUTH EVERY DAY AT BEDTIME AS NEEDED 30 tablet 1   cromolyn (NASALCROM) 5.2 MG/ACT nasal spray Place 2 sprays into the nose 2 (two) times daily.     doxazosin (CARDURA) 4 MG tablet Take 1 tablet (4 mg total) by mouth at bedtime. 90 tablet 1   ELIQUIS 5 MG TABS tablet TAKE 1 TABLET BY MOUTH TWICE A DAY 180 tablet 1   fenofibrate 160 MG tablet TAKE 1 TABLET BY MOUTH EVERY DAY 90 tablet 1   fish oil-omega-3 fatty acids 1000 MG capsule Take 4 g by mouth daily.     hydrochlorothiazide (MICROZIDE) 12.5 MG capsule Take 1 capsule (12.5 mg total) by mouth as needed (SWELLING). 90 capsule 1   lisinopril (ZESTRIL) 20 MG tablet TAKE 1 TABLET BY MOUTH EVERY DAY 90 tablet 3   metoprolol (TOPROL-XL) 200 MG 24 hr tablet  TAKE 1/2 TABLET BY MOUTH DAILY 45 tablet 3   Multiple Vitamins-Minerals (MULTIVITAMIN PO) Take 1 tablet by mouth daily.     Multiple Vitamins-Minerals (PRESERVISION AREDS 2) CAPS 2 po qd     simvastatin (ZOCOR) 20 MG tablet TAKE 1 TABLET BY MOUTH EVERYDAY AT BEDTIME 90 tablet 1   vitamin C (ASCORBIC ACID) 500 MG tablet Take 500 mg by mouth daily.     No current facility-administered medications for this encounter.    Allergies  Allergen Reactions   Sulfonamide Derivatives Other (See Comments)    Unknown    Social History   Socioeconomic History   Marital status: Widowed    Spouse name: Not on file    Number of children: Not on file   Years of education: Not on file   Highest education level: Not on file  Occupational History   Occupation: Professor---RETIRED    Employer: RETIRED    Comment: Retired  Tobacco Use   Smoking status: Never   Smokeless tobacco: Never  Vaping Use   Vaping Use: Never used  Substance and Sexual Activity   Alcohol use: No    Comment: Little wine occasionally    Drug use: No   Sexual activity: Never    Partners: Female  Other Topics Concern   Not on file  Social History Narrative   Not on file   Social Determinants of Health   Financial Resource Strain: Low Risk    Difficulty of Paying Living Expenses: Not hard at all  Food Insecurity: No Food Insecurity   Worried About Charity fundraiser in the Last Year: Never true   [place] in the Last Year: Never true  Transportation Needs: No Transportation Needs   Lack of Transportation (Medical): No   Lack of Transportation (Non-Medical): No  Physical Activity: Sufficiently Active   Days of Exercise per Week: 6 days   Minutes of Exercise per Session: 60 min  Stress: No Stress Concern Present   Feeling of Stress : Not at all  Social Connections: Moderately Isolated   Frequency of Communication with Friends and Family: More than three times a week   Frequency of Social Gatherings with Friends and Family: More than three times a week   Attends Religious Services: 1 to 4 times per year   Active Member of [employer] or Organizations: No   Attends Archivist Meetings: Never   Marital Status: Widowed  Human resources officer Violence: Not At Risk   Fear of Current or Ex-Partner: No   Emotionally Abused: No   Physically Abused: No   Sexually Abused: No     ROS- All systems are reviewed and negative except as per the HPI above.  Physical Exam: Vitals:   08/23/21 1427  BP: 122/62  Pulse: 76  Weight: 71.5 kg  Height: '5\' 8"'$  (1.727 m)    GEN- The patient is a well appearing elderly male,  alert and oriented x 3 today.   Head- normocephalic, atraumatic Eyes-  Sclera clear, conjunctiva pink Ears- hearing intact Oropharynx- clear Neck- supple  Lungs- Clear to ausculation bilaterally, normal work of breathing Heart- irregular rate and rhythm, no murmurs, rubs or gallops  GI- soft, NT, ND, + BS Extremities- no clubbing, cyanosis, or edema MS- no significant deformity or atrophy Skin- no rash or lesion Psych- euthymic mood, full affect Neuro- strength and sensation are intact  Wt Readings from Last 3 Encounters:  08/23/21 71.5 kg  07/28/21 75.3 kg  04/01/21 72.1  kg    EKG today demonstrates  Afib Vent. rate 76 BPM PR interval * ms QRS duration 80 ms QT/QTcB 360/405 ms  Echo 01/21/18 demonstrated  - Left ventricle: The cavity size was normal. There was moderate    concentric hypertrophy. Systolic function was normal. The    estimated ejection fraction was in the range of 55% to 60%. Wall    motion was normal; there were no regional wall motion    abnormalities. The study was not technically sufficient to allow    evaluation of LV diastolic dysfunction due to atrial    fibrillation.  - Aortic valve: Valve mobility was restricted. There was moderate    regurgitation.  - Ascending aorta: The ascending aorta was upper normal size    measuring 40 mm.  - Mitral valve: There was moderate regurgitation.  - Left atrium: The atrium was severely dilated.  - Right ventricle: The cavity size was normal. Wall thickness was    normal. Systolic function was normal.  - Right atrium: The atrium was mildly dilated.  - Tricuspid valve: There was moderate regurgitation.  - Pulmonic valve: There was trivial regurgitation.  - Pulmonary arteries: Systolic pressure was mildly increased. PA    peak pressure: 38 mm Hg (S).  - Inferior vena cava: The vessel was normal in size.  - Pericardium, extracardiac: There was no pericardial effusion.  Epic records are reviewed at length  today  CHA2DS2-VASc Score = 4  The patient's score is based upon: CHF History: No HTN History: Yes Diabetes History: No Stroke History: No Vascular Disease History: Yes (inferior mesenteric vein thrombosis) Age Score: 2 Gender Score: 0     ASSESSMENT AND PLAN: 1. Persistent Atrial Fibrillation (ICD10:  I48.19) The patient's CHA2DS2-VASc score is 4, indicating a 4.8% annual risk of stroke.   Patient appears to be in persistent rate controlled afib.  We discussed therapeutic options today. Will plan for DCCV. Check bmet/cbc today. He has done suprisingly well given his severely dilated LA.  Continue Toprol 100 mg daily Continue Eliquis 5 mg BID  2. Secondary Hypercoagulable State (ICD10:  D68.69) The patient is at significant risk for stroke/thromboembolism based upon his CHA2DS2-VASc Score of 4.  Continue Apixaban (Eliquis).   3. HTN Stable, no changes today.   Follow up in the AF clinic post DCCV.    [facility] [address] [place], [place] [address] [phone] 08/23/2021 2:35 PM

## 2021-08-23 NOTE — Patient Instructions (Signed)
Cardioversion scheduled for Friday, September 2nd  - Arrive at the [facility] and go to admitting at [facility] not eat or drink anything after midnight the night prior to your procedure.  - Take all your morning medication (except diabetic medications) with a sip of water prior to arrival.  - You will not be able to drive home after your procedure.  - Do NOT miss any doses of your blood thinner - if you should miss a dose please notify our office immediately.  - If you feel as if you go back into normal rhythm prior to scheduled cardioversion, please notify our office immediately. If your procedure is canceled in the cardioversion suite you will be charged a cancellation fee.   Patients will be asked to: to mask in public and hand hygiene (no longer quarantine) in the 3 days prior to surgery, to report if any COVID-19-like illness or household contacts to COVID-19 to determine need for testing

## 2021-08-23 NOTE — H&P (View-Only) (Signed)
Primary Care Physician: [name], [name], DO Primary Cardiologist: Dr Claiborne Billings Primary Electrophysiologist: none Referring Physician: Antonieta Pert triage   Mark Stark. is a 87 y.o. male with a history of HLD, HTN, prostate cancer, inferior mesenteric vein thrombosis, atrial fibrillation who presents for consultation in the [facility].  The patient was initially diagnosed with atrial fibrillation 11/2017 and underwent DCCV on 03/01/18. Patient is on Eliquis for a CHADS2VASC score of 4. Patient has done well with no recurrence of afib until 08/18/21 when he noted an irregular heart beat. He does have some mild SOB with exertion as well. He believes his afib started when he was working out in his garden and became dehydrated.   Today, he denies symptoms of chest pain, orthopnea, PND, lower extremity edema, dizziness, presyncope, syncope, snoring, daytime somnolence, bleeding, or neurologic sequela. The patient is tolerating medications without difficulties and is otherwise without complaint today.    Atrial Fibrillation Risk Factors:  he does not have symptoms or diagnosis of sleep apnea. he does not have a history of rheumatic fever. he does not have a history of alcohol use.   he has a BMI of Body mass index is 23.96 kg/m.[name] Filed Weights   08/23/21 1427  Weight: 71.5 kg    Family History  Problem Relation Age of Onset   Dementia Mother    Alzheimer's disease Mother    Mental illness Mother        ALZ DISEASE   Cancer Brother 54       pancreatic cancer     Atrial Fibrillation Management history:  Previous antiarrhythmic drugs: none Previous cardioversions: 03/01/18 Previous ablations: none CHADS2VASC score: 4 Anticoagulation history: Eliquis   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HCC)    Prostate cancer [facility])    Past Surgical History:   Procedure Laterality Date   CARDIOVERSION N/A 03/01/2018   Procedure: CARDIOVERSION;  Surgeon: Troy Sine, MD;  Location: [place];  Service: Cardiovascular;  Laterality: N/A;   CATARACT EXTRACTION  01/27/2010   right   CATARACT EXTRACTION  03/04/2010   left   Prostate seed implants     PROSTATE SURGERY      Current Outpatient Medications  Medication Sig Dispense Refill   b complex vitamins tablet Take 1 tablet by mouth daily.     Calcium Carbonate-Vitamin D 600-400 MG-UNIT tablet Take 1 tablet by mouth daily.     clorazepate (TRANXENE) 15 MG tablet TAKE 1 TABLET BY MOUTH EVERY DAY AT BEDTIME AS NEEDED 30 tablet 1   cromolyn (NASALCROM) 5.2 MG/ACT nasal spray Place 2 sprays into the nose 2 (two) times daily.     doxazosin (CARDURA) 4 MG tablet Take 1 tablet (4 mg total) by mouth at bedtime. 90 tablet 1   ELIQUIS 5 MG TABS tablet TAKE 1 TABLET BY MOUTH TWICE A DAY 180 tablet 1   fenofibrate 160 MG tablet TAKE 1 TABLET BY MOUTH EVERY DAY 90 tablet 1   fish oil-omega-3 fatty acids 1000 MG capsule Take 4 g by mouth daily.     hydrochlorothiazide (MICROZIDE) 12.5 MG capsule Take 1 capsule (12.5 mg total) by mouth as needed (SWELLING). 90 capsule 1   lisinopril (ZESTRIL) 20 MG tablet TAKE 1 TABLET BY MOUTH EVERY DAY 90 tablet 3   metoprolol (TOPROL-XL) 200 MG 24 hr tablet  TAKE 1/2 TABLET BY MOUTH DAILY 45 tablet 3   Multiple Vitamins-Minerals (MULTIVITAMIN PO) Take 1 tablet by mouth daily.     Multiple Vitamins-Minerals (PRESERVISION AREDS 2) CAPS 2 po qd     simvastatin (ZOCOR) 20 MG tablet TAKE 1 TABLET BY MOUTH EVERYDAY AT BEDTIME 90 tablet 1   vitamin C (ASCORBIC ACID) 500 MG tablet Take 500 mg by mouth daily.     No current facility-administered medications for this encounter.    Allergies  Allergen Reactions   Sulfonamide Derivatives Other (See Comments)    Unknown    Social History   Socioeconomic History   Marital status: Widowed    Spouse name: Not on file    Number of children: Not on file   Years of education: Not on file   Highest education level: Not on file  Occupational History   Occupation: Professor---RETIRED    Employer: RETIRED    Comment: Retired  Tobacco Use   Smoking status: Never   Smokeless tobacco: Never  Vaping Use   Vaping Use: Never used  Substance and Sexual Activity   Alcohol use: No    Comment: Little wine occasionally    Drug use: No   Sexual activity: Never    Partners: Female  Other Topics Concern   Not on file  Social History Narrative   Not on file   Social Determinants of Health   Financial Resource Strain: Low Risk    Difficulty of Paying Living Expenses: Not hard at all  Food Insecurity: No Food Insecurity   Worried About Charity fundraiser in the Last Year: Never true   [place] in the Last Year: Never true  Transportation Needs: No Transportation Needs   Lack of Transportation (Medical): No   Lack of Transportation (Non-Medical): No  Physical Activity: Sufficiently Active   Days of Exercise per Week: 6 days   Minutes of Exercise per Session: 60 min  Stress: No Stress Concern Present   Feeling of Stress : Not at all  Social Connections: Moderately Isolated   Frequency of Communication with Friends and Family: More than three times a week   Frequency of Social Gatherings with Friends and Family: More than three times a week   Attends Religious Services: 1 to 4 times per year   Active Member of [employer] or Organizations: No   Attends Archivist Meetings: Never   Marital Status: Widowed  Human resources officer Violence: Not At Risk   Fear of Current or Ex-Partner: No   Emotionally Abused: No   Physically Abused: No   Sexually Abused: No     ROS- All systems are reviewed and negative except as per the HPI above.  Physical Exam: Vitals:   08/23/21 1427  BP: 122/62  Pulse: 76  Weight: 71.5 kg  Height: '5\' 8"'$  (1.727 m)    GEN- The patient is a well appearing elderly male,  alert and oriented x 3 today.   Head- normocephalic, atraumatic Eyes-  Sclera clear, conjunctiva pink Ears- hearing intact Oropharynx- clear Neck- supple  Lungs- Clear to ausculation bilaterally, normal work of breathing Heart- irregular rate and rhythm, no murmurs, rubs or gallops  GI- soft, NT, ND, + BS Extremities- no clubbing, cyanosis, or edema MS- no significant deformity or atrophy Skin- no rash or lesion Psych- euthymic mood, full affect Neuro- strength and sensation are intact  Wt Readings from Last 3 Encounters:  08/23/21 71.5 kg  07/28/21 75.3 kg  04/01/21 72.1  kg    EKG today demonstrates  Afib Vent. rate 76 BPM PR interval * ms QRS duration 80 ms QT/QTcB 360/405 ms  Echo 01/21/18 demonstrated  - Left ventricle: The cavity size was normal. There was moderate    concentric hypertrophy. Systolic function was normal. The    estimated ejection fraction was in the range of 55% to 60%. Wall    motion was normal; there were no regional wall motion    abnormalities. The study was not technically sufficient to allow    evaluation of LV diastolic dysfunction due to atrial    fibrillation.  - Aortic valve: Valve mobility was restricted. There was moderate    regurgitation.  - Ascending aorta: The ascending aorta was upper normal size    measuring 40 mm.  - Mitral valve: There was moderate regurgitation.  - Left atrium: The atrium was severely dilated.  - Right ventricle: The cavity size was normal. Wall thickness was    normal. Systolic function was normal.  - Right atrium: The atrium was mildly dilated.  - Tricuspid valve: There was moderate regurgitation.  - Pulmonic valve: There was trivial regurgitation.  - Pulmonary arteries: Systolic pressure was mildly increased. PA    peak pressure: 38 mm Hg (S).  - Inferior vena cava: The vessel was normal in size.  - Pericardium, extracardiac: There was no pericardial effusion.  Epic records are reviewed at length  today  CHA2DS2-VASc Score = 4  The patient's score is based upon: CHF History: No HTN History: Yes Diabetes History: No Stroke History: No Vascular Disease History: Yes (inferior mesenteric vein thrombosis) Age Score: 2 Gender Score: 0     ASSESSMENT AND PLAN: 1. Persistent Atrial Fibrillation (ICD10:  I48.19) The patient's CHA2DS2-VASc score is 4, indicating a 4.8% annual risk of stroke.   Patient appears to be in persistent rate controlled afib.  We discussed therapeutic options today. Will plan for DCCV. Check bmet/cbc today. He has done suprisingly well given his severely dilated LA.  Continue Toprol 100 mg daily Continue Eliquis 5 mg BID  2. Secondary Hypercoagulable State (ICD10:  D68.69) The patient is at significant risk for stroke/thromboembolism based upon his CHA2DS2-VASc Score of 4.  Continue Apixaban (Eliquis).   3. HTN Stable, no changes today.   Follow up in the AF clinic post DCCV.    [facility] [address] [place], [place] [address] [phone] 08/23/2021 2:35 PM

## 2021-08-25 DIAGNOSIS — H35373 Puckering of macula, bilateral: Secondary | ICD-10-CM | POA: Diagnosis not present

## 2021-08-25 DIAGNOSIS — H353231 Exudative age-related macular degeneration, bilateral, with active choroidal neovascularization: Secondary | ICD-10-CM | POA: Diagnosis not present

## 2021-08-26 ENCOUNTER — Ambulatory Visit (HOSPITAL_COMMUNITY): Payer: Medicare Other | Admitting: Anesthesiology

## 2021-08-26 ENCOUNTER — Encounter (HOSPITAL_COMMUNITY)
Admission: RE | Disposition: A | Discharge status: Home / Self Care | Payer: Self-pay | Source: Home / Self Care | Attending: Internal Medicine

## 2021-08-26 ENCOUNTER — Other Ambulatory Visit: Payer: Self-pay

## 2021-08-26 ENCOUNTER — Ambulatory Visit (HOSPITAL_COMMUNITY)
Admission: RE | Admit: 2021-08-26 | Discharge: 2021-08-26 | Disposition: A | Discharge status: Home / Self Care | Payer: Medicare Other | Attending: Internal Medicine | Admitting: Internal Medicine

## 2021-08-26 ENCOUNTER — Encounter (HOSPITAL_COMMUNITY): Payer: Self-pay | Admitting: Internal Medicine

## 2021-08-26 DIAGNOSIS — D6869 Other thrombophilia: Secondary | ICD-10-CM | POA: Diagnosis not present

## 2021-08-26 DIAGNOSIS — Z8546 Personal history of malignant neoplasm of prostate: Secondary | ICD-10-CM | POA: Insufficient documentation

## 2021-08-26 DIAGNOSIS — Z7901 Long term (current) use of anticoagulants: Secondary | ICD-10-CM | POA: Insufficient documentation

## 2021-08-26 DIAGNOSIS — Z882 Allergy status to sulfonamides status: Secondary | ICD-10-CM | POA: Insufficient documentation

## 2021-08-26 DIAGNOSIS — I4819 Other persistent atrial fibrillation: Secondary | ICD-10-CM | POA: Insufficient documentation

## 2021-08-26 DIAGNOSIS — D696 Thrombocytopenia, unspecified: Secondary | ICD-10-CM | POA: Diagnosis not present

## 2021-08-26 DIAGNOSIS — Z79899 Other long term (current) drug therapy: Secondary | ICD-10-CM | POA: Insufficient documentation

## 2021-08-26 DIAGNOSIS — I10 Essential (primary) hypertension: Secondary | ICD-10-CM | POA: Diagnosis not present

## 2021-08-26 DIAGNOSIS — E785 Hyperlipidemia, unspecified: Secondary | ICD-10-CM | POA: Diagnosis not present

## 2021-08-26 DIAGNOSIS — F411 Generalized anxiety disorder: Secondary | ICD-10-CM | POA: Diagnosis not present

## 2021-08-26 DIAGNOSIS — I480 Paroxysmal atrial fibrillation: Secondary | ICD-10-CM | POA: Diagnosis not present

## 2021-08-26 HISTORY — PX: CARDIOVERSION: SHX1299

## 2021-08-26 SURGERY — CARDIOVERSION
Anesthesia: General

## 2021-08-26 MED ORDER — PROPOFOL 10 MG/ML IV BOLUS
INTRAVENOUS | Status: DC | PRN
Start: 2021-08-26 — End: 2021-08-26
  Administered 2021-08-26: 50 mg via INTRAVENOUS

## 2021-08-26 MED ORDER — SODIUM CHLORIDE 0.9 % IV SOLN: INTRAVENOUS | Status: DC | PRN

## 2021-08-26 MED ORDER — PHENYLEPHRINE 40 MCG/ML (10ML) SYRINGE FOR IV PUSH (FOR BLOOD PRESSURE SUPPORT)
PREFILLED_SYRINGE | INTRAVENOUS | Status: DC | PRN
  Administered 2021-08-26: 80 ug via INTRAVENOUS

## 2021-08-26 MED ORDER — LIDOCAINE 2% (20 MG/ML) 5 ML SYRINGE
INTRAMUSCULAR | Status: DC | PRN
  Administered 2021-08-26: 50 mg via INTRAVENOUS

## 2021-08-26 NOTE — Transfer of Care (Addendum)
Immediate Anesthesia Transfer of Care Note  Patient: Mark Stark.  Procedure(s) Performed: CARDIOVERSION  Patient Location: Endoscopy Unit  Anesthesia Type:General  Level of Consciousness: drowsy  Airway & Oxygen Therapy: Patient Spontanous Breathing and Patient connected to nasal cannula oxygen  Post-op Assessment: Report given to RN and Post -op Vital signs reviewed and stable  Post vital signs: Reviewed and stable  Last Vitals:  Vitals Value Taken Time  BP 105/47   Temp    Pulse 89 08/26/21 0846  Resp 15 08/26/21 0846  SpO2 100 % 08/26/21 0846  Vitals shown include unvalidated device data.  Last Pain:  Vitals:   08/26/21 0803  TempSrc: Temporal  PainSc: 0-No pain         Complications: No notable events documented.

## 2021-08-26 NOTE — Discharge Instructions (Signed)

## 2021-08-26 NOTE — Anesthesia Postprocedure Evaluation (Signed)
Anesthesia Post Note  Patient: Mark Stark.  Procedure(s) Performed: CARDIOVERSION     Patient location during evaluation: PACU Anesthesia Type: General Level of consciousness: awake and alert Pain management: pain level controlled Vital Signs Assessment: post-procedure vital signs reviewed and stable Respiratory status: spontaneous breathing, nonlabored ventilation and respiratory function stable Cardiovascular status: blood pressure returned to baseline and stable Postop Assessment: no apparent nausea or vomiting Anesthetic complications: no   No notable events documented.  Last Vitals:  Vitals:   08/26/21 0910 08/26/21 0919  BP: (!) 108/56 118/60  Pulse: 64 67  Resp: (!) 26 17  Temp:    SpO2: 99% 100%    Last Pain:  Vitals:   08/26/21 0908  TempSrc:   PainSc: 0-No pain                 [name]

## 2021-08-26 NOTE — CV Procedure (Signed)
   CARDIOVERSION NOTE  Procedure: Electrical Cardioversion Indications:  Atrial Fibrillation  Procedure Details:  Consent: Risks of procedure as well as the alternatives and risks of each were explained to the (patient/caregiver).  Consent for procedure obtained.  Time Out: Verified patient identification, verified procedure, site/side was marked, verified correct patient position, special equipment/implants available, medications/allergies/relevent history reviewed, required imaging and test results available.  Performed  Patient placed on cardiac monitor, pulse oximetry, supplemental oxygen as necessary.  Sedation given:  propofol per anesthesia Pacer pads placed anterior and posterior chest.  Cardioverted 1 time(s).  Cardioverted at 150J biphasic.  Impression: Findings: Post procedure EKG shows: NSR Complications: None Patient did tolerate procedure well.  Plan: Successful DCCV with a single 150J biphasic shock  Time Spent Directly with the Patient:  30 minutes   Pixie Casino, MD, [facility], [place] Director of the Advanced Lipid Disorders &  Cardiovascular Risk Reduction Clinic Diplomate of the American Board of Clinical Lipidology Attending Cardiologist  Direct Dial: [phone]  Fax: [phone]  Website:  [email or web address] Lagerquist 08/26/2021, 8:55 AM

## 2021-08-26 NOTE — Anesthesia Preprocedure Evaluation (Signed)
Anesthesia Evaluation  Patient identified by MRN, date of birth, ID band Patient awake    Reviewed: Allergy & Precautions, NPO status , Patient's Chart, lab work & pertinent test results, reviewed documented beta blocker date and time   Airway Mallampati: II  TM Distance: >3 FB Neck ROM: Full    Dental  (+) Dental Advisory Given, Caps   Pulmonary neg pulmonary ROS,    Pulmonary exam normal breath sounds clear to auscultation       Cardiovascular hypertension, Pt. on medications and Pt. on home beta blockers + dysrhythmias Atrial Fibrillation + Valvular Problems/Murmurs  Rhythm:Irregular Rate:Normal  Hx/o superior mesenteric vein thrombosis   Neuro/Psych Anxiety Bilateral hearing loss- wears hearing aids  Neuromuscular disease    GI/Hepatic negative GI ROS, Neg liver ROS,   Endo/Other  Hyperlipidemia  Renal/GU negative Renal ROS  negative genitourinary   Musculoskeletal negative musculoskeletal ROS (+)   Abdominal   Peds  Hematology Eliquis therapy- last dose this am   Anesthesia Other Findings   Reproductive/Obstetrics                             Anesthesia Physical Anesthesia Plan  ASA: 3  Anesthesia Plan: General   Post-op Pain Management:    Induction: Intravenous  PONV Risk Score and Plan: 2 and Treatment may vary due to age or medical condition  Airway Management Planned: Natural Airway and Simple Face Mask  Additional Equipment:   Intra-op Plan:   Post-operative Plan:   Informed Consent: I have reviewed the patients History and Physical, chart, labs and discussed the procedure including the risks, benefits and alternatives for the proposed anesthesia with the patient or authorized representative who has indicated his/her understanding and acceptance.     Dental advisory given  Plan Discussed with: CRNA and Anesthesiologist  Anesthesia Plan Comments:          Anesthesia Quick Evaluation

## 2021-08-26 NOTE — Interval H&P Note (Signed)
History and Physical Interval Note:  08/26/2021 8:17 AM  Mark Stark.  has presented today for surgery, with the diagnosis of AFIB.  The various methods of treatment have been discussed with the patient and family. After consideration of risks, benefits and other options for treatment, the patient has consented to  Procedure(s): CARDIOVERSION (N/A) as a surgical intervention.  The patient's history has been reviewed, patient examined, no change in status, stable for surgery.  I have reviewed the patient's chart and labs.  Questions were answered to the patient's satisfaction.     Pixie Casino

## 2021-08-29 ENCOUNTER — Encounter (HOSPITAL_COMMUNITY): Payer: Self-pay | Admitting: Internal Medicine

## 2021-09-01 ENCOUNTER — Ambulatory Visit (HOSPITAL_COMMUNITY)
Admission: RE | Admit: 2021-09-01 | Discharge: 2021-09-01 | Disposition: A | Discharge status: Home / Self Care | Payer: Medicare Other | Source: Ambulatory Visit | Attending: Physician Assistant | Admitting: Physician Assistant

## 2021-09-01 ENCOUNTER — Other Ambulatory Visit: Payer: Self-pay

## 2021-09-01 ENCOUNTER — Encounter (HOSPITAL_COMMUNITY): Payer: Self-pay | Admitting: Physician Assistant

## 2021-09-01 VITALS — BP 140/82 | HR 84 | Ht 68.0 in | Wt 160.4 lb

## 2021-09-01 DIAGNOSIS — I4819 Other persistent atrial fibrillation: Secondary | ICD-10-CM | POA: Insufficient documentation

## 2021-09-01 DIAGNOSIS — E785 Hyperlipidemia, unspecified: Secondary | ICD-10-CM | POA: Diagnosis not present

## 2021-09-01 DIAGNOSIS — Z7901 Long term (current) use of anticoagulants: Secondary | ICD-10-CM | POA: Insufficient documentation

## 2021-09-01 DIAGNOSIS — Z882 Allergy status to sulfonamides status: Secondary | ICD-10-CM | POA: Insufficient documentation

## 2021-09-01 DIAGNOSIS — I10 Essential (primary) hypertension: Secondary | ICD-10-CM | POA: Insufficient documentation

## 2021-09-01 DIAGNOSIS — D6869 Other thrombophilia: Secondary | ICD-10-CM | POA: Insufficient documentation

## 2021-09-01 DIAGNOSIS — Z8546 Personal history of malignant neoplasm of prostate: Secondary | ICD-10-CM | POA: Diagnosis not present

## 2021-09-01 DIAGNOSIS — Z79899 Other long term (current) drug therapy: Secondary | ICD-10-CM | POA: Diagnosis not present

## 2021-09-01 NOTE — Progress Notes (Signed)
Primary Care Physician: [name], [name], DO Primary Cardiologist: Dr Claiborne Billings Primary Electrophysiologist: none Referring Physician: Antonieta Pert triage   Mark Riles. is a 87 y.o. male with a history of HLD, HTN, prostate cancer, inferior mesenteric vein thrombosis, atrial fibrillation who presents for follow up in the [facility].  The patient was initially diagnosed with atrial fibrillation 11/2017 and underwent DCCV on 03/01/18. Patient is on Eliquis for a CHADS2VASC score of 4. Patient has done well with no recurrence of afib until 08/18/21 when he noted an irregular heart beat. He did have some mild SOB with exertion as well. He believes his afib started when he was working out in his garden and became dehydrated.   On follow up today, patient is s/p DCCV on 08/26/21. Unfortunately, he was back out of rhythm two days later based on his "irregular heartbeat" readings from his BP machine. He states that he did not feel any different in SR. He feels "strong" today and is still able to exercise and do yard work. He denies any bleeding issues on anticoagulation.   Today, he denies symptoms of palpitations, chest pain, orthopnea, PND, lower extremity edema, dizziness, presyncope, syncope, snoring, daytime somnolence, bleeding, or neurologic sequela. The patient is tolerating medications without difficulties and is otherwise without complaint today.    Atrial Fibrillation Risk Factors:  he does not have symptoms or diagnosis of sleep apnea. he does not have a history of rheumatic fever. he does not have a history of alcohol use.   he has a BMI of Body mass index is 24.39 kg/m.[name] Filed Weights   09/01/21 1402  Weight: 72.8 kg     Family History  Problem Relation Age of Onset   Dementia Mother    Alzheimer's disease Mother    Mental illness Mother        ALZ DISEASE   Cancer Brother 93       pancreatic cancer     Atrial Fibrillation Management  history:  Previous antiarrhythmic drugs: none Previous cardioversions: 03/01/18, 08/26/21 Previous ablations: none CHADS2VASC score: 4 Anticoagulation history: Eliquis   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HCC)    Prostate cancer [facility])    Past Surgical History:  Procedure Laterality Date   CARDIOVERSION N/A 03/01/2018   Procedure: CARDIOVERSION;  Surgeon: Troy Sine, MD;  Location: [place];  Service: Cardiovascular;  Laterality: N/A;   CARDIOVERSION N/A 08/26/2021   Procedure: CARDIOVERSION;  Surgeon: Pixie Casino, MD;  Location: [place];  Service: Cardiovascular;  Laterality: N/A;   CATARACT EXTRACTION  01/27/2010   right   CATARACT EXTRACTION  03/04/2010   left   Prostate seed implants     PROSTATE SURGERY      Current Outpatient Medications  Medication Sig Dispense Refill   b complex vitamins tablet Take 1 tablet by mouth daily.     Calcium Carbonate-Vitamin D 600-400 MG-UNIT tablet Take 1 tablet by mouth daily.     clorazepate (TRANXENE) 15 MG tablet TAKE 1 TABLET BY MOUTH EVERY DAY AT BEDTIME AS NEEDED (Patient taking differently: Take 7.5 mg by mouth 2 (two) times daily.) 30 tablet 1   cromolyn (NASALCROM) 5.2 MG/ACT nasal spray Place 2 sprays into the nose 2 (two) times daily.     doxazosin (CARDURA) 4 MG tablet Take 1 tablet (4 mg total)  by mouth at bedtime. 90 tablet 1   ELIQUIS 5 MG TABS tablet TAKE 1 TABLET BY MOUTH TWICE A DAY 180 tablet 1   fenofibrate 160 MG tablet TAKE 1 TABLET BY MOUTH EVERY DAY 90 tablet 1   fish oil-omega-3 fatty acids 1000 MG capsule Take 1 g by mouth 3 (three) times daily.     hydrochlorothiazide (MICROZIDE) 12.5 MG capsule Take 1 capsule (12.5 mg total) by mouth as needed (SWELLING). 90 capsule 1   lisinopril (ZESTRIL) 20 MG tablet TAKE 1 TABLET BY MOUTH EVERY DAY 90 tablet 3   metoprolol (TOPROL-XL) 200 MG 24 hr tablet TAKE  1/2 TABLET BY MOUTH DAILY 45 tablet 3   Multiple Vitamins-Minerals (MULTIVITAMIN PO) Take 1 tablet by mouth daily.     Multiple Vitamins-Minerals (PRESERVISION AREDS 2) CAPS 2 po qd     simvastatin (ZOCOR) 20 MG tablet TAKE 1 TABLET BY MOUTH EVERYDAY AT BEDTIME 90 tablet 1   vitamin C (ASCORBIC ACID) 500 MG tablet Take 500 mg by mouth daily.     No current facility-administered medications for this encounter.    Allergies  Allergen Reactions   Sulfonamide Derivatives Other (See Comments)    Unknown    Social History   Socioeconomic History   Marital status: Widowed    Spouse name: Not on file   Number of children: Not on file   Years of education: Not on file   Highest education level: Not on file  Occupational History   Occupation: Professor---RETIRED    Employer: RETIRED    Comment: Retired  Tobacco Use   Smoking status: Never   Smokeless tobacco: Never  Vaping Use   Vaping Use: Never used  Substance and Sexual Activity   Alcohol use: No    Comment: Little wine occasionally    Drug use: No   Sexual activity: Never    Partners: Female  Other Topics Concern   Not on file  Social History Narrative   Not on file   Social Determinants of Health   Financial Resource Strain: Low Risk    Difficulty of Paying Living Expenses: Not hard at all  Food Insecurity: No Food Insecurity   Worried About Charity fundraiser in the Last Year: Never true   [place] in the Last Year: Never true  Transportation Needs: No Transportation Needs   Lack of Transportation (Medical): No   Lack of Transportation (Non-Medical): No  Physical Activity: Sufficiently Active   Days of Exercise per Week: 6 days   Minutes of Exercise per Session: 60 min  Stress: No Stress Concern Present   Feeling of Stress : Not at all  Social Connections: Moderately Isolated   Frequency of Communication with Friends and Family: More than three times a week   Frequency of Social Gatherings with Friends  and Family: More than three times a week   Attends Religious Services: 1 to 4 times per year   Active Member of [employer] or Organizations: No   Attends Archivist Meetings: Never   Marital Status: Widowed  Human resources officer Violence: Not At Risk   Fear of Current or Ex-Partner: No   Emotionally Abused: No   Physically Abused: No   Sexually Abused: No     ROS- All systems are reviewed and negative except as per the HPI above.  Physical Exam: Vitals:   09/01/21 1402  BP: 140/82  Pulse: 84  Weight: 72.8 kg  Height: '5\' 8"'$  (1.727 m)  GEN- The patient is a well appearing elderly male, alert and oriented x 3 today.   HEENT-head normocephalic, atraumatic, sclera clear, conjunctiva pink, hearing intact, trachea midline. Lungs- Clear to ausculation bilaterally, normal work of breathing Heart- irregular rate and rhythm, no murmurs, rubs or gallops  GI- soft, NT, ND, + BS Extremities- no clubbing, cyanosis, or edema MS- no significant deformity or atrophy Skin- no rash or lesion Psych- euthymic mood, full affect Neuro- strength and sensation are intact   Wt Readings from Last 3 Encounters:  09/01/21 72.8 kg  08/26/21 70 kg  08/23/21 71.5 kg    EKG today demonstrates  Afib Vent. rate 84 BPM PR interval * ms QRS duration 74 ms QT/QTcB 350/413 ms  Echo 01/21/18 demonstrated  - Left ventricle: The cavity size was normal. There was moderate    concentric hypertrophy. Systolic function was normal. The    estimated ejection fraction was in the range of 55% to 60%. Wall    motion was normal; there were no regional wall motion    abnormalities. The study was not technically sufficient to allow    evaluation of LV diastolic dysfunction due to atrial    fibrillation.  - Aortic valve: Valve mobility was restricted. There was moderate    regurgitation.  - Ascending aorta: The ascending aorta was upper normal size    measuring 40 mm.  - Mitral valve: There was moderate  regurgitation.  - Left atrium: The atrium was severely dilated.  - Right ventricle: The cavity size was normal. Wall thickness was    normal. Systolic function was normal.  - Right atrium: The atrium was mildly dilated.  - Tricuspid valve: There was moderate regurgitation.  - Pulmonic valve: There was trivial regurgitation.  - Pulmonary arteries: Systolic pressure was mildly increased. PA    peak pressure: 38 mm Hg (S).  - Inferior vena cava: The vessel was normal in size.  - Pericardium, extracardiac: There was no pericardial effusion.  Epic records are reviewed at length today  CHA2DS2-VASc Score = 4  The patient's score is based upon: CHF History: 0 HTN History: 1 Diabetes History: 0 Stroke History: 0 Vascular Disease History: 1 (inferior mesenteric vein thrombosis) Age Score: 2 Gender Score: 0     ASSESSMENT AND PLAN: 1. Persistent Atrial Fibrillation (ICD10:  I48.19) The patient's CHA2DS2-VASc score is 4, indicating a 4.8% annual risk of stroke. S/p DCCV with early return of afib. We discussed options today including rate vs rhythm control. Given his advanced age, paucity of symptoms, and severely dilated LA, will pursue a rate control strategy. Patient in agreement with plan. Will check echo in 2-3 months to assess EF in afib. Continue Toprol 100 mg daily Continue Eliquis 5 mg BID  2. Secondary Hypercoagulable State (ICD10:  D68.69) The patient is at significant risk for stroke/thromboembolism based upon his CHA2DS2-VASc Score of 4.  Continue Apixaban (Eliquis).   3. HTN Stable, no changes today.   Follow up in the AF clinic in 2-3 months after echo.    [facility] [address] [place], [place] [address] [phone] 09/01/2021 2:39 PM

## 2021-09-05 ENCOUNTER — Other Ambulatory Visit (INDEPENDENT_AMBULATORY_CARE_PROVIDER_SITE_OTHER): Payer: Medicare Other

## 2021-09-05 ENCOUNTER — Other Ambulatory Visit: Payer: Self-pay

## 2021-09-05 DIAGNOSIS — D649 Anemia, unspecified: Secondary | ICD-10-CM

## 2021-09-06 LAB — CBC WITH DIFFERENTIAL/PLATELET
Basophils Absolute: 0.1 10*3/uL (ref 0.0–0.1)
Basophils Relative: 1.3 % (ref 0.0–3.0)
Eosinophils Absolute: 0.1 10*3/uL (ref 0.0–0.7)
Eosinophils Relative: 2 % (ref 0.0–5.0)
HCT: 40.5 % (ref 39.0–52.0)
Hemoglobin: 13.5 g/dL (ref 13.0–17.0)
Lymphocytes Relative: 34 % (ref 12.0–46.0)
Lymphs Abs: 2.2 10*3/uL (ref 0.7–4.0)
MCHC: 33.4 g/dL (ref 30.0–36.0)
MCV: 95 fl (ref 78.0–100.0)
Monocytes Absolute: 0.9 10*3/uL (ref 0.1–1.0)
Monocytes Relative: 13.3 % — ABNORMAL HIGH (ref 3.0–12.0)
Neutro Abs: 3.3 10*3/uL (ref 1.4–7.7)
Neutrophils Relative %: 49.4 % (ref 43.0–77.0)
Platelets: 162 10*3/uL (ref 150.0–400.0)
RBC: 4.27 Mil/uL (ref 4.22–5.81)
RDW: 13.2 % (ref 11.5–15.5)
WBC: 6.6 10*3/uL (ref 4.0–10.5)

## 2021-09-06 LAB — IBC + FERRITIN
Ferritin: 135.9 ng/mL (ref 22.0–322.0)
Iron: 118 ug/dL (ref 42–165)
Saturation Ratios: 33.2 % (ref 20.0–50.0)
TIBC: 355.6 ug/dL (ref 250.0–450.0)
Transferrin: 254 mg/dL (ref 212.0–360.0)

## 2021-09-07 DIAGNOSIS — Z23 Encounter for immunization: Secondary | ICD-10-CM | POA: Diagnosis not present

## 2021-09-26 ENCOUNTER — Other Ambulatory Visit: Payer: Self-pay | Admitting: Family Medicine

## 2021-09-26 DIAGNOSIS — F411 Generalized anxiety disorder: Secondary | ICD-10-CM

## 2021-09-26 NOTE — Telephone Encounter (Signed)
Requesting: Tranxene Contract: 07/28/21 UDS: 07/28/21 Last OV: 07/28/21 Next OV: 02/06/22 Last Refill: 08/19/21, #30--1 RF Database:   Please advise

## 2021-10-07 DIAGNOSIS — C61 Malignant neoplasm of prostate: Secondary | ICD-10-CM | POA: Diagnosis not present

## 2021-10-07 DIAGNOSIS — R351 Nocturia: Secondary | ICD-10-CM | POA: Diagnosis not present

## 2021-10-13 ENCOUNTER — Encounter (HOSPITAL_COMMUNITY): Payer: Self-pay | Admitting: Physician Assistant

## 2021-10-13 ENCOUNTER — Ambulatory Visit (HOSPITAL_COMMUNITY)
Admission: RE | Admit: 2021-10-13 | Discharge: 2021-10-13 | Disposition: A | Discharge status: Home / Self Care | Payer: Medicare Other | Source: Ambulatory Visit | Attending: Physician Assistant | Admitting: Physician Assistant

## 2021-10-13 ENCOUNTER — Other Ambulatory Visit: Payer: Self-pay

## 2021-10-13 VITALS — BP 122/88 | HR 104 | Ht 68.0 in | Wt 166.6 lb

## 2021-10-13 DIAGNOSIS — Z882 Allergy status to sulfonamides status: Secondary | ICD-10-CM | POA: Diagnosis not present

## 2021-10-13 DIAGNOSIS — D6869 Other thrombophilia: Secondary | ICD-10-CM

## 2021-10-13 DIAGNOSIS — I4819 Other persistent atrial fibrillation: Secondary | ICD-10-CM | POA: Diagnosis not present

## 2021-10-13 DIAGNOSIS — Z7901 Long term (current) use of anticoagulants: Secondary | ICD-10-CM | POA: Diagnosis not present

## 2021-10-13 DIAGNOSIS — I10 Essential (primary) hypertension: Secondary | ICD-10-CM | POA: Insufficient documentation

## 2021-10-13 LAB — MAGNESIUM: Magnesium: 1.9 mg/dL (ref 1.7–2.4)

## 2021-10-13 LAB — BASIC METABOLIC PANEL
Anion gap: 6 (ref 5–15)
BUN: 23 mg/dL (ref 8–23)
CO2: 29 mmol/L (ref 22–32)
Calcium: 8.9 mg/dL (ref 8.9–10.3)
Chloride: 102 mmol/L (ref 98–111)
Creatinine, Ser: 1.09 mg/dL (ref 0.61–1.24)
GFR, Estimated: >60 mL/min (ref >60)
Glucose, Bld: 98 mg/dL (ref 70–99)
Potassium: 4.1 mmol/L (ref 3.5–5.1)
Sodium: 137 mmol/L (ref 135–145)

## 2021-10-13 MED ORDER — FUROSEMIDE 20 MG PO TABS
ORAL_TABLET | ORAL | 0 refills | Status: DC
Start: 2021-10-13 — End: 2021-10-18

## 2021-10-13 NOTE — Progress Notes (Signed)
Primary Care Physician: [name], [name], DO Primary Cardiologist: Dr Claiborne Billings Primary Electrophysiologist: none Referring Physician: Antonieta Pert triage   Mark Riles. is a 87 y.o. male with a history of HLD, HTN, prostate cancer, inferior mesenteric vein thrombosis, atrial fibrillation who presents for follow up in the [facility].  The patient was initially diagnosed with atrial fibrillation 11/2017 and underwent DCCV on 03/01/18. Patient is on Eliquis for a CHADS2VASC score of 4. Patient has done well with no recurrence of afib until 08/18/21 when he noted an irregular heart beat. He did have some mild SOB with exertion as well. He believes his afib started when he was working out in his garden and became dehydrated. Patient is s/p DCCV on 08/26/21. Unfortunately, he was back out of rhythm two days later based on his "irregular heartbeat" readings from his BP machine.   On follow up today, patient reports that since his last visit he has felt more fatigued with exertion. He has also noted more lower extremity edema in is ankles. His home BP machine has shown good rate control (he checks 3 times daily) with rates 70s-90s. He denies any bleeding issues on anticoagulation.   Today, he denies symptoms of palpitations, chest pain, orthopnea, PND, dizziness, presyncope, syncope, snoring, daytime somnolence, bleeding, or neurologic sequela. The patient is tolerating medications without difficulties and is otherwise without complaint today.    Atrial Fibrillation Risk Factors:  he does not have symptoms or diagnosis of sleep apnea. he does not have a history of rheumatic fever. he does not have a history of alcohol use.   he has a BMI of Body mass index is 25.33 kg/m.[name] Filed Weights   10/13/21 1323  Weight: 75.6 kg      Family History  Problem Relation Age of Onset   Dementia Mother    Alzheimer's disease Mother    Mental illness Mother        ALZ DISEASE    Cancer Brother 61       pancreatic cancer     Atrial Fibrillation Management history:  Previous antiarrhythmic drugs: none Previous cardioversions: 03/01/18, 08/26/21 Previous ablations: none CHADS2VASC score: 4 Anticoagulation history: Eliquis   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HCC)    Prostate cancer [facility])    Past Surgical History:  Procedure Laterality Date   CARDIOVERSION N/A 03/01/2018   Procedure: CARDIOVERSION;  Surgeon: Troy Sine, MD;  Location: [place];  Service: Cardiovascular;  Laterality: N/A;   CARDIOVERSION N/A 08/26/2021   Procedure: CARDIOVERSION;  Surgeon: Pixie Casino, MD;  Location: [place];  Service: Cardiovascular;  Laterality: N/A;   CATARACT EXTRACTION  01/27/2010   right   CATARACT EXTRACTION  03/04/2010   left   Prostate seed implants     PROSTATE SURGERY      Current Outpatient Medications  Medication Sig Dispense Refill   b complex vitamins tablet Take 1 tablet by mouth daily.     Calcium Carbonate-Vitamin D 600-400 MG-UNIT tablet Take 1 tablet by mouth daily.     clorazepate (TRANXENE) 15 MG tablet TAKE 1 TABLET BY MOUTH EVERY DAY AT BEDTIME AS NEEDED 30 tablet 1   cromolyn (NASALCROM) 5.2 MG/ACT nasal spray Place 2 sprays into the nose 2 (two) times daily.     doxazosin (CARDURA) 4 MG tablet Take 1 tablet (  4 mg total) by mouth at bedtime. 90 tablet 1   ELIQUIS 5 MG TABS tablet TAKE 1 TABLET BY MOUTH TWICE A DAY 180 tablet 1   fenofibrate 160 MG tablet TAKE 1 TABLET BY MOUTH EVERY DAY 90 tablet 1   fish oil-omega-3 fatty acids 1000 MG capsule Take 1 g by mouth 3 (three) times daily.     furosemide (LASIX) 20 MG tablet Take 1 tablet by mouth daily for 3 days then only as needed for weight gain/swelling 15 tablet 0   lisinopril (ZESTRIL) 20 MG tablet TAKE 1 TABLET BY MOUTH EVERY DAY 90 tablet 3   metoprolol (TOPROL-XL) 200  MG 24 hr tablet TAKE 1/2 TABLET BY MOUTH DAILY 45 tablet 3   Multiple Vitamins-Minerals (MULTIVITAMIN PO) Take 1 tablet by mouth daily.     Multiple Vitamins-Minerals (PRESERVISION AREDS 2) CAPS 2 po qd     simvastatin (ZOCOR) 20 MG tablet TAKE 1 TABLET BY MOUTH EVERYDAY AT BEDTIME 90 tablet 1   vitamin C (ASCORBIC ACID) 500 MG tablet Take 500 mg by mouth daily.     No current facility-administered medications for this encounter.    Allergies  Allergen Reactions   Sulfonamide Derivatives Other (See Comments)    Unknown    Social History   Socioeconomic History   Marital status: Widowed    Spouse name: Not on file   Number of children: Not on file   Years of education: Not on file   Highest education level: Not on file  Occupational History   Occupation: Professor---RETIRED    Employer: RETIRED    Comment: Retired  Tobacco Use   Smoking status: Never   Smokeless tobacco: Never  Vaping Use   Vaping Use: Never used  Substance and Sexual Activity   Alcohol use: No    Comment: Little wine occasionally    Drug use: No   Sexual activity: Never    Partners: Female  Other Topics Concern   Not on file  Social History Narrative   Not on file   Social Determinants of Health   Financial Resource Strain: Low Risk    Difficulty of Paying Living Expenses: Not hard at all  Food Insecurity: No Food Insecurity   Worried About Charity fundraiser in the Last Year: Never true   [place] in the Last Year: Never true  Transportation Needs: No Transportation Needs   Lack of Transportation (Medical): No   Lack of Transportation (Non-Medical): No  Physical Activity: Sufficiently Active   Days of Exercise per Week: 6 days   Minutes of Exercise per Session: 60 min  Stress: No Stress Concern Present   Feeling of Stress : Not at all  Social Connections: Moderately Isolated   Frequency of Communication with Friends and Family: More than three times a week   Frequency of Social  Gatherings with Friends and Family: More than three times a week   Attends Religious Services: 1 to 4 times per year   Active Member of [employer] or Organizations: No   Attends Archivist Meetings: Never   Marital Status: Widowed  Human resources officer Violence: Not At Risk   Fear of Current or Ex-Partner: No   Emotionally Abused: No   Physically Abused: No   Sexually Abused: No     ROS- All systems are reviewed and negative except as per the HPI above.  Physical Exam: Vitals:   10/13/21 1323  BP: 122/88  Pulse: (!) 104  Weight:  75.6 kg  Height: 5\' 8"  (1.727 m)    GEN- The patient is a well appearing elderly male, alert and oriented x 3 today.   HEENT-head normocephalic, atraumatic, sclera clear, conjunctiva pink, hearing intact, trachea midline. Lungs- Clear to ausculation bilaterally, normal work of breathing Heart- irregular rate and rhythm, no murmurs, rubs or gallops  GI- soft, NT, ND, + BS Extremities- no clubbing, cyanosis. 1+ bilateral edema MS- no significant deformity or atrophy Skin- no rash or lesion Psych- euthymic mood, full affect Neuro- strength and sensation are intact   Wt Readings from Last 3 Encounters:  10/13/21 75.6 kg  09/01/21 72.8 kg  08/26/21 70 kg    EKG today demonstrates  Afib Vent. rate 104 BPM PR interval * ms QRS duration 74 ms QT/QTcB 336/441 ms  Echo 01/21/18 demonstrated  - Left ventricle: The cavity size was normal. There was moderate    concentric hypertrophy. Systolic function was normal. The    estimated ejection fraction was in the range of 55% to 60%. Wall    motion was normal; there were no regional wall motion    abnormalities. The study was not technically sufficient to allow    evaluation of LV diastolic dysfunction due to atrial    fibrillation.  - Aortic valve: Valve mobility was restricted. There was moderate    regurgitation.  - Ascending aorta: The ascending aorta was upper normal size    measuring 40 mm.   - Mitral valve: There was moderate regurgitation.  - Left atrium: The atrium was severely dilated.  - Right ventricle: The cavity size was normal. Wall thickness was    normal. Systolic function was normal.  - Right atrium: The atrium was mildly dilated.  - Tricuspid valve: There was moderate regurgitation.  - Pulmonic valve: There was trivial regurgitation.  - Pulmonary arteries: Systolic pressure was mildly increased. PA    peak pressure: 38 mm Hg (S).  - Inferior vena cava: The vessel was normal in size.  - Pericardium, extracardiac: There was no pericardial effusion.  Epic records are reviewed at length today  CHA2DS2-VASc Score = 4  The patient's score is based upon: CHF History: 0 HTN History: 1 Diabetes History: 0 Stroke History: 0 Vascular Disease History: 1 (inferior mesenteric vein thrombosis) Age Score: 2 Gender Score: 0     ASSESSMENT AND PLAN: 1. Persistent Atrial Fibrillation (ICD10:  I48.19) The patient's CHA2DS2-VASc score is 4, indicating a 4.8% annual risk of stroke. S/p DCCV with early return of afib. We had previously discussed rate control given his advanced age, paucity of symptoms, and severely dilated LA. However, he has had more symptoms recently.  We discussed AAD options including dofetilide and amiodarone. He has a 1st degree AV block that limits some AAD options.  Patient would like to take time to consider his options and will follow up in a couple weeks.  Will stop HCTZ and start lasix 20 mg daily x 3 days then PRN. Check bmet/mag Continue Toprol 100 mg daily Continue Eliquis 5 mg BID  2. Secondary Hypercoagulable State (ICD10:  D68.69) The patient is at significant risk for stroke/thromboembolism based upon his CHA2DS2-VASc Score of 4.  Continue Apixaban (Eliquis).   3. HTN Stable, med changes as above.    Follow up in the AF clinic for echo and visit as scheduled.    [facility] [address] [place], [place] [address] [phone] 10/13/2021 4:48 PM

## 2021-10-13 NOTE — Patient Instructions (Signed)
Stop HCTZ  Start Lasix 20mg  once a day for 3 days then only as needed for weight gain/swelling

## 2021-10-17 ENCOUNTER — Telehealth (HOSPITAL_COMMUNITY): Payer: Self-pay | Admitting: *Deleted

## 2021-10-17 NOTE — Telephone Encounter (Signed)
Patient called in stating he has was awoken to shortness of breath early this morning and continued swelling of lower extremities. He does not feel much response occurred with furosemide over the weekend. Discussed with Adline Peals PA will take 40mg  of lasix now and call with update tomorrow. Pt has not made a decision regarding which medication he would prefer to start for afib treatment.

## 2021-10-18 ENCOUNTER — Other Ambulatory Visit: Payer: Self-pay | Admitting: Family Medicine

## 2021-10-18 DIAGNOSIS — I480 Paroxysmal atrial fibrillation: Secondary | ICD-10-CM

## 2021-10-18 MED ORDER — FUROSEMIDE 20 MG PO TABS: 20.0000 mg | ORAL_TABLET | Freq: Every day | ORAL | 3 refills | Status: DC

## 2021-10-18 MED ORDER — POTASSIUM CHLORIDE ER 10 MEQ PO TBCR: 10.0000 meq | EXTENDED_RELEASE_TABLET | Freq: Every day | ORAL | 3 refills | Status: DC

## 2021-10-18 NOTE — Telephone Encounter (Signed)
Patient states he had great response to lasix still some LLE but improved. Breathing much better able to sleep last night without issues. Discussed with Adline Peals PA will continue lasix 20mg  once a day and kdur 6meq once a day. Follow up as scheduled unless symptoms return. Pt verbalized understanding.

## 2021-10-25 ENCOUNTER — Emergency Department (HOSPITAL_COMMUNITY): Payer: Medicare Other

## 2021-10-25 ENCOUNTER — Inpatient Hospital Stay (HOSPITAL_COMMUNITY)
Admission: EM | Admit: 2021-10-25 | Discharge: 2021-10-28 | DRG: 291 | Disposition: A | Discharge status: Home Health | Payer: Medicare Other | Attending: Cardiology | Admitting: Cardiology

## 2021-10-25 ENCOUNTER — Other Ambulatory Visit: Payer: Self-pay

## 2021-10-25 ENCOUNTER — Encounter (HOSPITAL_COMMUNITY): Payer: Self-pay

## 2021-10-25 DIAGNOSIS — R0601 Orthopnea: Secondary | ICD-10-CM | POA: Diagnosis not present

## 2021-10-25 DIAGNOSIS — I509 Heart failure, unspecified: Secondary | ICD-10-CM

## 2021-10-25 DIAGNOSIS — E785 Hyperlipidemia, unspecified: Secondary | ICD-10-CM | POA: Diagnosis present

## 2021-10-25 DIAGNOSIS — N189 Chronic kidney disease, unspecified: Secondary | ICD-10-CM

## 2021-10-25 DIAGNOSIS — E1122 Type 2 diabetes mellitus with diabetic chronic kidney disease: Secondary | ICD-10-CM | POA: Diagnosis present

## 2021-10-25 DIAGNOSIS — I5031 Acute diastolic (congestive) heart failure: Secondary | ICD-10-CM | POA: Diagnosis not present

## 2021-10-25 DIAGNOSIS — Z8546 Personal history of malignant neoplasm of prostate: Secondary | ICD-10-CM

## 2021-10-25 DIAGNOSIS — R0600 Dyspnea, unspecified: Secondary | ICD-10-CM | POA: Diagnosis not present

## 2021-10-25 DIAGNOSIS — I4811 Longstanding persistent atrial fibrillation: Secondary | ICD-10-CM | POA: Diagnosis present

## 2021-10-25 DIAGNOSIS — I440 Atrioventricular block, first degree: Secondary | ICD-10-CM | POA: Diagnosis present

## 2021-10-25 DIAGNOSIS — I4891 Unspecified atrial fibrillation: Secondary | ICD-10-CM | POA: Diagnosis not present

## 2021-10-25 DIAGNOSIS — R000 Tachycardia, unspecified: Secondary | ICD-10-CM | POA: Diagnosis not present

## 2021-10-25 DIAGNOSIS — Z882 Allergy status to sulfonamides status: Secondary | ICD-10-CM

## 2021-10-25 DIAGNOSIS — Z818 Family history of other mental and behavioral disorders: Secondary | ICD-10-CM

## 2021-10-25 DIAGNOSIS — Z20822 Contact with and (suspected) exposure to covid-19: Secondary | ICD-10-CM | POA: Diagnosis present

## 2021-10-25 DIAGNOSIS — I10 Essential (primary) hypertension: Secondary | ICD-10-CM | POA: Diagnosis present

## 2021-10-25 DIAGNOSIS — R600 Localized edema: Secondary | ICD-10-CM

## 2021-10-25 DIAGNOSIS — I130 Hypertensive heart and chronic kidney disease with heart failure and stage 1 through stage 4 chronic kidney disease, or unspecified chronic kidney disease: Principal | ICD-10-CM | POA: Diagnosis present

## 2021-10-25 DIAGNOSIS — I4819 Other persistent atrial fibrillation: Secondary | ICD-10-CM | POA: Diagnosis present

## 2021-10-25 DIAGNOSIS — Z7901 Long term (current) use of anticoagulants: Secondary | ICD-10-CM | POA: Diagnosis not present

## 2021-10-25 DIAGNOSIS — R0602 Shortness of breath: Secondary | ICD-10-CM | POA: Diagnosis not present

## 2021-10-25 DIAGNOSIS — N179 Acute kidney failure, unspecified: Secondary | ICD-10-CM | POA: Diagnosis not present

## 2021-10-25 DIAGNOSIS — N1831 Chronic kidney disease, stage 3a: Secondary | ICD-10-CM | POA: Diagnosis present

## 2021-10-25 DIAGNOSIS — R0902 Hypoxemia: Secondary | ICD-10-CM | POA: Diagnosis not present

## 2021-10-25 DIAGNOSIS — I340 Nonrheumatic mitral (valve) insufficiency: Secondary | ICD-10-CM | POA: Diagnosis not present

## 2021-10-25 DIAGNOSIS — Z79899 Other long term (current) drug therapy: Secondary | ICD-10-CM | POA: Diagnosis not present

## 2021-10-25 DIAGNOSIS — R0609 Other forms of dyspnea: Secondary | ICD-10-CM | POA: Diagnosis not present

## 2021-10-25 DIAGNOSIS — R6889 Other general symptoms and signs: Secondary | ICD-10-CM

## 2021-10-25 DIAGNOSIS — E860 Dehydration: Secondary | ICD-10-CM | POA: Diagnosis present

## 2021-10-25 DIAGNOSIS — I351 Nonrheumatic aortic (valve) insufficiency: Secondary | ICD-10-CM

## 2021-10-25 DIAGNOSIS — Z820 Family history of epilepsy and other diseases of the nervous system: Secondary | ICD-10-CM | POA: Diagnosis not present

## 2021-10-25 DIAGNOSIS — J90 Pleural effusion, not elsewhere classified: Secondary | ICD-10-CM | POA: Diagnosis not present

## 2021-10-25 HISTORY — DX: Nonrheumatic mitral (valve) insufficiency: I34.0

## 2021-10-25 HISTORY — DX: Unspecified atrial fibrillation: I48.91

## 2021-10-25 HISTORY — DX: Chronic diastolic (congestive) heart failure: I50.32

## 2021-10-25 HISTORY — DX: Chronic kidney disease, stage 3a: N18.31

## 2021-10-25 HISTORY — DX: Acute diastolic (congestive) heart failure: I50.31

## 2021-10-25 HISTORY — DX: Nonrheumatic aortic (valve) insufficiency: I35.1

## 2021-10-25 LAB — I-STAT CHEM 8, ED
BUN: 24 mg/dL — ABNORMAL HIGH (ref 8–23)
Calcium, Ion: 1.15 mmol/L (ref 1.15–1.40)
Chloride: 100 mmol/L (ref 98–111)
Creatinine, Ser: 1.3 mg/dL — ABNORMAL HIGH (ref 0.61–1.24)
Glucose, Bld: 97 mg/dL (ref 70–99)
HCT: 38 % — ABNORMAL LOW (ref 39.0–52.0)
Hemoglobin: 12.9 g/dL — ABNORMAL LOW (ref 13.0–17.0)
Potassium: 4.2 mmol/L (ref 3.5–5.1)
Sodium: 136 mmol/L (ref 135–145)
TCO2: 27 mmol/L (ref 22–32)

## 2021-10-25 LAB — URINALYSIS, ROUTINE W REFLEX MICROSCOPIC
Bilirubin Urine: NEGATIVE
Glucose, UA: NEGATIVE mg/dL
Hgb urine dipstick: NEGATIVE
Ketones, ur: NEGATIVE mg/dL
Leukocytes,Ua: NEGATIVE
Nitrite: NEGATIVE
Protein, ur: NEGATIVE mg/dL
Specific Gravity, Urine: 1.014 (ref 1.005–1.030)
pH: 5 (ref 5.0–8.0)

## 2021-10-25 LAB — COMPREHENSIVE METABOLIC PANEL
ALT: 22 U/L (ref 0–44)
AST: 30 U/L (ref 15–41)
Albumin: 3.4 g/dL — ABNORMAL LOW (ref 3.5–5.0)
Alkaline Phosphatase: 34 U/L — ABNORMAL LOW (ref 38–126)
Anion gap: 9 (ref 5–15)
BUN: 23 mg/dL (ref 8–23)
CO2: 25 mmol/L (ref 22–32)
Calcium: 8.2 mg/dL — ABNORMAL LOW (ref 8.9–10.3)
Chloride: 98 mmol/L (ref 98–111)
Creatinine, Ser: 1.29 mg/dL — ABNORMAL HIGH (ref 0.61–1.24)
GFR, Estimated: 52 mL/min — ABNORMAL LOW (ref >60)
Glucose, Bld: 106 mg/dL — ABNORMAL HIGH (ref 70–99)
Potassium: 4.2 mmol/L (ref 3.5–5.1)
Sodium: 132 mmol/L — ABNORMAL LOW (ref 135–145)
Total Bilirubin: 1.1 mg/dL (ref 0.3–1.2)
Total Protein: 5.6 g/dL — ABNORMAL LOW (ref 6.5–8.1)

## 2021-10-25 LAB — CBC WITH DIFFERENTIAL/PLATELET
Abs Immature Granulocytes: 0.04 10*3/uL (ref 0.00–0.07)
Basophils Absolute: 0.1 10*3/uL (ref 0.0–0.1)
Basophils Relative: 1 %
Eosinophils Absolute: 0.2 10*3/uL (ref 0.0–0.5)
Eosinophils Relative: 2 %
HCT: 37.3 % — ABNORMAL LOW (ref 39.0–52.0)
Hemoglobin: 12.3 g/dL — ABNORMAL LOW (ref 13.0–17.0)
Immature Granulocytes: 1 %
Lymphocytes Relative: 24 %
Lymphs Abs: 1.8 10*3/uL (ref 0.7–4.0)
MCH: 31.8 pg (ref 26.0–34.0)
MCHC: 33 g/dL (ref 30.0–36.0)
MCV: 96.4 fL (ref 80.0–100.0)
Monocytes Absolute: 0.9 10*3/uL (ref 0.1–1.0)
Monocytes Relative: 12 %
Neutro Abs: 4.7 10*3/uL (ref 1.7–7.7)
Neutrophils Relative %: 60 %
Platelets: 144 10*3/uL — ABNORMAL LOW (ref 150–400)
RBC: 3.87 MIL/uL — ABNORMAL LOW (ref 4.22–5.81)
RDW: 13.4 % (ref 11.5–15.5)
WBC: 7.7 10*3/uL (ref 4.0–10.5)
nRBC: 0 % (ref 0.0–0.2)

## 2021-10-25 LAB — TROPONIN I (HIGH SENSITIVITY)
Troponin I (High Sensitivity): 10 ng/L (ref <18)
Troponin I (High Sensitivity): 9 ng/L (ref <18)

## 2021-10-25 LAB — RESP PANEL BY RT-PCR (FLU A&B, COVID) ARPGX2
Influenza A by PCR: NEGATIVE
Influenza B by PCR: NEGATIVE
SARS Coronavirus 2 by RT PCR: NEGATIVE

## 2021-10-25 LAB — BRAIN NATRIURETIC PEPTIDE: B Natriuretic Peptide: 623.5 pg/mL — ABNORMAL HIGH (ref 0.0–100.0)

## 2021-10-25 LAB — MAGNESIUM: Magnesium: 1.8 mg/dL (ref 1.7–2.4)

## 2021-10-25 LAB — TSH: TSH: 2.517 u[IU]/mL (ref 0.350–4.500)

## 2021-10-25 MED ORDER — SIMVASTATIN 20 MG PO TABS: 20.0000 mg | ORAL_TABLET | Freq: Every day | ORAL | Status: DC

## 2021-10-25 MED ORDER — SIMVASTATIN 20 MG PO TABS
20.0000 mg | ORAL_TABLET | Freq: Every day | ORAL | Status: DC
  Administered 2021-10-26 – 2021-10-27 (×2): 20 mg via ORAL
  Filled 2021-10-25 (×2): qty 1

## 2021-10-25 MED ORDER — FUROSEMIDE 10 MG/ML IJ SOLN
40.0000 mg | Freq: Two times a day (BID) | INTRAMUSCULAR | Status: DC
  Administered 2021-10-25 – 2021-10-26 (×2): 40 mg via INTRAVENOUS
  Filled 2021-10-25 (×2): qty 4

## 2021-10-25 MED ORDER — METOPROLOL SUCCINATE ER 100 MG PO TB24
100.0000 mg | ORAL_TABLET | Freq: Every day | ORAL | Status: DC
  Administered 2021-10-26 – 2021-10-28 (×4): 100 mg via ORAL
  Filled 2021-10-25 (×4): qty 1

## 2021-10-25 MED ORDER — CLORAZEPATE DIPOTASSIUM 3.75 MG PO TABS
15.0000 mg | ORAL_TABLET | Freq: Every evening | ORAL | Status: DC | PRN
  Filled 2021-10-25: qty 2
  Filled 2021-10-25: qty 4

## 2021-10-25 MED ORDER — ACETAMINOPHEN 325 MG PO TABS: 650.0000 mg | ORAL_TABLET | ORAL | Status: DC | PRN

## 2021-10-25 MED ORDER — ONDANSETRON HCL 4 MG/2ML IJ SOLN: 4.0000 mg | Freq: Four times a day (QID) | INTRAMUSCULAR | Status: DC | PRN

## 2021-10-25 MED ORDER — SODIUM CHLORIDE 0.9% FLUSH: 3.0000 mL | INTRAVENOUS | Status: DC | PRN

## 2021-10-25 MED ORDER — OYSTER SHELL CALCIUM/D3 500-5 MG-MCG PO TABS
1.0000 | ORAL_TABLET | Freq: Every day | ORAL | Status: DC
  Administered 2021-10-25 – 2021-10-28 (×3): 1 via ORAL
  Filled 2021-10-25 (×3): qty 1

## 2021-10-25 MED ORDER — APIXABAN 5 MG PO TABS: 5.0000 mg | ORAL_TABLET | Freq: Two times a day (BID) | ORAL | Status: DC

## 2021-10-25 MED ORDER — METOPROLOL SUCCINATE ER 100 MG PO TB24: 100.0000 mg | ORAL_TABLET | Freq: Every day | ORAL | Status: DC

## 2021-10-25 MED ORDER — B COMPLEX-C PO TABS
1.0000 | ORAL_TABLET | Freq: Every day | ORAL | Status: DC
  Administered 2021-10-26 – 2021-10-28 (×3): 1 via ORAL
  Filled 2021-10-25 (×3): qty 1

## 2021-10-25 MED ORDER — SODIUM CHLORIDE 0.9% FLUSH
3.0000 mL | Freq: Two times a day (BID) | INTRAVENOUS | Status: DC
  Administered 2021-10-25 – 2021-10-28 (×6): 3 mL via INTRAVENOUS

## 2021-10-25 MED ORDER — APIXABAN 5 MG PO TABS
5.0000 mg | ORAL_TABLET | Freq: Two times a day (BID) | ORAL | Status: DC
  Administered 2021-10-25 – 2021-10-28 (×7): 5 mg via ORAL
  Filled 2021-10-25 (×7): qty 1

## 2021-10-25 MED ORDER — B COMPLEX PO TABS: 1.0000 | ORAL_TABLET | Freq: Every day | ORAL | Status: DC

## 2021-10-25 MED ORDER — SODIUM CHLORIDE 0.9 % IV SOLN: 250.0000 mL | INTRAVENOUS | Status: DC | PRN

## 2021-10-25 MED ORDER — OMEGA-3 FATTY ACIDS 1000 MG PO CAPS: 1.0000 g | ORAL_CAPSULE | Freq: Three times a day (TID) | ORAL | Status: DC

## 2021-10-25 MED ORDER — OMEGA-3-ACID ETHYL ESTERS 1 G PO CAPS
1.0000 g | ORAL_CAPSULE | Freq: Two times a day (BID) | ORAL | Status: DC
  Administered 2021-10-25 – 2021-10-28 (×6): 1 g via ORAL
  Filled 2021-10-25 (×6): qty 1

## 2021-10-25 MED ORDER — FENOFIBRATE 160 MG PO TABS
160.0000 mg | ORAL_TABLET | Freq: Every day | ORAL | Status: DC
  Administered 2021-10-25 – 2021-10-28 (×4): 160 mg via ORAL
  Filled 2021-10-25 (×4): qty 1

## 2021-10-25 MED ORDER — CALCIUM CARBONATE-VITAMIN D 600-400 MG-UNIT PO TABS: 1.0000 | ORAL_TABLET | Freq: Every day | ORAL | Status: DC

## 2021-10-25 MED ORDER — DOXAZOSIN MESYLATE 4 MG PO TABS
4.0000 mg | ORAL_TABLET | Freq: Every day | ORAL | Status: DC
  Administered 2021-10-26 – 2021-10-27 (×3): 4 mg via ORAL
  Filled 2021-10-25 (×3): qty 1

## 2021-10-25 MED ORDER — POTASSIUM CHLORIDE CRYS ER 10 MEQ PO TBCR
10.0000 meq | EXTENDED_RELEASE_TABLET | Freq: Every day | ORAL | Status: DC
  Administered 2021-10-25 – 2021-10-27 (×3): 10 meq via ORAL
  Filled 2021-10-25 (×3): qty 1

## 2021-10-25 NOTE — ED Triage Notes (Addendum)
Pt from home BIB EMS for c/o [facility] that woke pt up from sleep around 0500. HR 110-120s. Hx of afib with 2 ablations. Pt denies CP on arrival and denies any CP with episode this am. A/Ox4.  134/86 97% RA HR 115 CBG 142 20 LAC

## 2021-10-25 NOTE — H&P (Signed)
Cardiology Admission History and Physical:   Patient ID: Mark Stark. MRN: [id number]; DOB: 1928/04/12   Admission date: 10/25/2021  PCP:  Lowne Chase, [place] [facility] Cardiologist:  Shelva Majestic, MD        Chief Complaint:  SOB  Patient Profile:   Mark Stark. is a 87 y.o. male with hs of atrial fib, HLD, HTN, prostate cancer, inferior mesenteric vein thrombosis,  who is being seen 10/25/2021 for the evaluation of SOB,CHF.  History of Present Illness:   Mark Stark with above hx and atrial fib first episode in 2018 with DCCV 2019 to SR.  Placed on Eliquis for CHA2DS2VASc of 4.  He had done well until 08/18/21 and was in a fib with mild SOB with exertion.  He felt he was dehydrated working in garden and went into a fib.  He was cardioverted 08/26/21 but back in a fib in 2 days.   He has recently been follow in a fib clinic. Last visit on the 20th of Oct he had more fatigue but HR was controlled.  Medication adjustments reviewed with dofetilide and amiodarone - he does have 1st degree AV block. His HCTZ was stopped and lasix added and pt was to follow up.   Pt called on the 24th with SOB and lower ext edema.  -his lasix increased and this did help for a few days.  Last night the SOB woke him from sleep.  No chest pain, pressure or tightness.  SOB with exertion originally but now even at rest.  Currently with head of stretcher up he is stable some mild shallow breathing though when he waked to BR he was dyspneic.  We discussed meds and his wife was on amiodarone prior to her death so he prefers not to be on amiodarone and dofetilide scares him as well.  He would like to just have DCCV.  Today he presents to ER with SOB.    EKG:  The ECG that was done today was personally reviewed and demonstrates atrial fib rate of 72 and no acute ST changes.    Na 132-136 K+ 4.2 Cr 1.29 up from 1.09 on admit Mg+ 1.9 albumin 3.4  BMP 623 hs troponin 10 and 9 WBC 7.7, Hgb 12.3 plts 144   COVID neg Urine clear PCXR:  Stable cardiomediastinal contours. Small bilateral pleural effusions and mild interstitial edema identified. No airspace consolidation. Lung volumes appear low. Visualized osseous structures appear intact.  BP 131/108 P 88 R 19-21 afebrile  Past Medical History:  Diagnosis Date   Anxiety    Atrial fibrillation (HCC)    Diverticulitis    Dyslipidemia    Hypertension    Inferior mesenteric vein thrombosis ([place])    Prostate cancer [facility])     Past Surgical History:  Procedure Laterality Date   CARDIOVERSION N/A 03/01/2018   Procedure: CARDIOVERSION;  Surgeon: Troy Sine, MD;  Location: [facility];  Service: Cardiovascular;  Laterality: N/A;   CARDIOVERSION N/A 08/26/2021   Procedure: CARDIOVERSION;  Surgeon: Pixie Casino, MD;  Location: [facility] ENDOSCOPY;  Service: Cardiovascular;  Laterality: N/A;   CATARACT EXTRACTION  01/27/2010   right   CATARACT EXTRACTION  03/04/2010   left   Prostate seed implants     PROSTATE SURGERY       Medications Prior to Admission: Prior to Admission medications   Medication Sig Start Date End Date Taking? Authorizing Provider  ELIQUIS 5 MG TABS tablet TAKE  1 TABLET BY MOUTH TWICE A DAY 10/18/21   Roma Schanz R, DO  b complex vitamins tablet Take 1 tablet by mouth daily.    [provider]  Calcium Carbonate-Vitamin D 600-400 MG-UNIT tablet Take 1 tablet by mouth daily.    [provider]  clorazepate (TRANXENE) 15 MG tablet TAKE 1 TABLET BY MOUTH EVERY DAY AT BEDTIME AS NEEDED 09/26/21   [name], [name], DO  cromolyn (NASALCROM) 5.2 MG/ACT nasal spray Place 2 sprays into the nose 2 (two) times daily.    [provider]  doxazosin (CARDURA) 4 MG tablet Take 1 tablet (4 mg total) by mouth at bedtime. 07/28/21   Roma Schanz R, DO  fenofibrate 160 MG tablet TAKE 1 TABLET BY MOUTH EVERY DAY 04/28/21   [name], [name], DO  fish oil-omega-3 fatty acids 1000 MG capsule  Take 1 g by mouth 3 (three) times daily.    [provider]  furosemide (LASIX) 20 MG tablet Take 1 tablet (20 mg total) by mouth daily. 10/18/21   Fenton, Clint R, PA  lisinopril (ZESTRIL) 20 MG tablet TAKE 1 TABLET BY MOUTH EVERY DAY 03/07/21   Troy Sine, MD  metoprolol (TOPROL-XL) 200 MG 24 hr tablet TAKE 1/2 TABLET BY MOUTH DAILY 04/28/21   [name], DO  Multiple Vitamins-Minerals (MULTIVITAMIN PO) Take 1 tablet by mouth daily.    [provider]  Multiple Vitamins-Minerals (PRESERVISION AREDS 2) CAPS 2 po qd 07/05/17   [name], [name], DO  potassium chloride (KLOR-CON) 10 MEQ tablet Take 1 tablet (10 mEq total) by mouth daily. 10/18/21 01/16/22  Fenton, Clint R, PA  simvastatin (ZOCOR) 20 MG tablet TAKE 1 TABLET BY MOUTH EVERYDAY AT BEDTIME 04/28/21   [name], [name], DO  vitamin C (ASCORBIC ACID) 500 MG tablet Take 500 mg by mouth daily.    [provider]     Allergies:    Allergies  Allergen Reactions   Sulfonamide Derivatives Other (See Comments)    Unknown    Social History:   Social History   Socioeconomic History   Marital status: Widowed    Spouse name: Not on file   Number of children: Not on file   Years of education: Not on file   Highest education level: Not on file  Occupational History   Occupation: Professor---RETIRED    Employer: RETIRED    Comment: Retired  Tobacco Use   Smoking status: Never   Smokeless tobacco: Never  Vaping Use   Vaping Use: Never used  Substance and Sexual Activity   Alcohol use: No    Comment: Little wine occasionally    Drug use: No   Sexual activity: Never    Partners: Female  Other Topics Concern   Not on file  Social History Narrative   Not on file   Social Determinants of Health   Financial Resource Strain: Low Risk    Difficulty of Paying Living Expenses: Not hard at all  Food Insecurity: No Food Insecurity   Worried About Charity fundraiser in the Last Year: Never  true   [place] in the Last Year: Never true  Transportation Needs: No Transportation Needs   Lack of Transportation (Medical): No   Lack of Transportation (Non-Medical): No  Physical Activity: Sufficiently Active   Days of Exercise per Week: 6 days   Minutes of Exercise per Session: 60 min  Stress: No Stress Concern  Present   Feeling of Stress : Not at all  Social Connections: Moderately Isolated   Frequency of Communication with Friends and Family: More than three times a week   Frequency of Social Gatherings with Friends and Family: More than three times a week   Attends Religious Services: 1 to 4 times per year   Active Member of [employer] or Organizations: No   Attends Archivist Meetings: Never   Marital Status: Widowed  Human resources officer Violence: Not At Risk   Fear of Current or Ex-Partner: No   Emotionally Abused: No   Physically Abused: No   Sexually Abused: No    Family History:   The patient's family history includes Alzheimer's disease in his mother; Cancer (age of onset: 24) in his brother; Dementia in his mother; Mental illness in his mother.    ROS:  Please see the history of present illness.  General:no colds or fevers, no weight changes Skin:no rashes or ulcers HEENT:no blurred vision, no congestion CV:see HPI PUL:see HPI GI:no diarrhea constipation or melena, no indigestion GU:no hematuria, no dysuria MS:no joint pain, no claudication Neuro:no syncope, no lightheadedness Endo:no diabetes, no thyroid disease All other ROS reviewed and negative.     Physical Exam/Data:   Vitals:   10/25/21 0930 10/25/21 0945 10/25/21 1009 10/25/21 1145  BP: 133/76 118/77 119/77 (!) 131/108  Pulse: 80 (!) 112 87 81  Resp: 19 17 (!) 21 19  Temp:      TempSrc:      SpO2: 93% 94% 96% 98%   No intake or output data in the 24 hours ending 10/25/21 1200 Last 3 Weights 10/13/2021 09/01/2021 08/26/2021  Weight (lbs) 166 lb 9.6 oz 160 lb 6.4 oz 154 lb 6.4 oz   Weight (kg) 75.569 kg 72.757 kg 70.035 kg     There is no height or weight on file to calculate BMI.  General:  Well nourished, well developed, in no acute distress resting in stretcher HEENT: normal Neck: no JVD sitting in stretcher  Vascular: No carotid bruits; Distal pulses 2+ bilaterally   Cardiac:  irreg irreg; ; no murmur gallup rub or click Lungs:  fine rales to auscultation bilaterally, no wheezing, rhonchi Abd: soft, nontender, no hepatomegaly  Ext:2+ edema to knees and 1-2 + in back of thighs.   Musculoskeletal:  No deformities, BUE and BLE strength normal and equal Skin: warm and dry  Neuro:  alert and oriented X 3 MAE follows commands, no focal abnormalities noted Psych:  Normal affect     Relevant CV Studies: Echo 01/21/18 demonstrated  - Left ventricle: The cavity size was normal. There was moderate    concentric hypertrophy. Systolic function was normal. The    estimated ejection fraction was in the range of 55% to 60%. Wall    motion was normal; there were no regional wall motion    abnormalities. The study was not technically sufficient to allow    evaluation of LV diastolic dysfunction due to atrial    fibrillation.  - Aortic valve: Valve mobility was restricted. There was moderate    regurgitation.  - Ascending aorta: The ascending aorta was upper normal size    measuring 40 mm.  - Mitral valve: There was moderate regurgitation.  - Left atrium: The atrium was severely dilated.  - Right ventricle: The cavity size was normal. Wall thickness was    normal. Systolic function was normal.  - Right atrium: The atrium was mildly dilated.  - Tricuspid valve:  There was moderate regurgitation.  - Pulmonic valve: There was trivial regurgitation.  - Pulmonary arteries: Systolic pressure was mildly increased. PA    peak pressure: 38 mm Hg (S).  - Inferior vena cava: The vessel was normal in size.  - Pericardium, extracardiac: There was no pericardial effusion.     Laboratory Data:  High Sensitivity Troponin:   Recent Labs  Lab 10/25/21 0830  TROPONINIHS 10      Chemistry Recent Labs  Lab 10/25/21 0830 10/25/21 0905  NA 132* 136  K 4.2 4.2  CL 98 100  CO2 25  --   GLUCOSE 106* 97  BUN 23 24*  CREATININE 1.29* 1.30*  CALCIUM 8.2*  --   MG 1.8  --   GFRNONAA 52*  --   ANIONGAP 9  --     Recent Labs  Lab 10/25/21 0830  PROT 5.6*  ALBUMIN 3.4*  AST 30  ALT 22  ALKPHOS 34*  BILITOT 1.1   Lipids No results for input(s): CHOL, TRIG, HDL, LABVLDL, LDLCALC, CHOLHDL in the last 168 hours. Hematology Recent Labs  Lab 10/25/21 0830 10/25/21 0905  WBC 7.7  --   RBC 3.87*  --   HGB 12.3* 12.9*  HCT 37.3* 38.0*  MCV 96.4  --   MCH 31.8  --   MCHC 33.0  --   RDW 13.4  --   PLT 144*  --    Thyroid No results for input(s): TSH, FREET4 in the last 168 hours. BNP Recent Labs  Lab 10/25/21 0830  BNP 623.5*    DDimer No results for input(s): DDIMER in the last 168 hours.   Radiology/Studies:  DG Chest Port 1 View  Result Date: 10/25/2021 CLINICAL DATA:  Dyspnea. EXAM: PORTABLE CHEST 1 VIEW COMPARISON:  06/25/2015 FINDINGS: Stable cardiomediastinal contours. Small bilateral pleural effusions and mild interstitial edema identified. No airspace consolidation. Lung volumes appear low. Visualized osseous structures appear intact. IMPRESSION: 1. Mild CHF. 2. Low lung volumes. Electronically Signed   By: Kerby Moors M.D.   On: 10/25/2021 08:47     Assessment and Plan:   Acute CHF may be from atrial fib though rate is controlled.  Cr up slightly but no lasix given yet.  Will give IV lasix and monitor. Last echo in 2019 will recheck.  Follow K+, Cr and Mg+  in hallway of ER difficult to give diuretic with no urinal use- will try to get tele bed ASAP   on toprol XL 100 daily, has been on lasix 20 1 dailys since stopping HCTZ.  Atrial fib plan had been to try antiarrythmic amiodarone or dofetilide.   Pt was trying to decide. Both  meds make him anxious he would prefer to try another DCCV - we discussed a medication would help him stay in regular rhythm.  Missed dose of Eliquis this AM.   HLD on statin and fenofibrate and last LDL in 07/2021 HTN on lisinopril and BB    Risk Assessment/Risk Scores:       New York Heart Association (NYHA) Functional Class NYHA Class III  CHA2DS2-VASc Score = 4   This indicates a 4.8% annual risk of stroke. The patient's score is based upon: CHF History: 0 HTN History: 1 Diabetes History: 0 Stroke History: 0 Vascular Disease History: 1 (inferior mesenteric vein thrombosis) Age Score: 2 Gender Score: 0      Severity of Illness: The appropriate patient status for this patient is INPATIENT. Inpatient status is judged to be reasonable  and necessary in order to provide the required intensity of service to ensure the patient's safety. The patient's presenting symptoms, physical exam findings, and initial radiographic and laboratory data in the context of their chronic comorbidities is felt to place them at high risk for further clinical deterioration. Furthermore, it is not anticipated that the patient will be medically stable for discharge from the hospital within 2 midnights of admission.   * I certify that at the point of admission it is my clinical judgment that the patient will require inpatient hospital care spanning beyond 2 midnights from the point of admission due to high intensity of service, high risk for further deterioration and high frequency of surveillance required.*   For questions or updates, please contact [place] Please consult [email or web address] for contact info under     Signed, Cecilie Kicks, NP  10/25/2021 12:00 PM

## 2021-10-25 NOTE — Progress Notes (Signed)
Heart Failure Nurse Navigator Progress Note  Pt in hallway of ED at this time. Will follow this admission to assess for HV TOC readiness. Pt concerned mostly about food and drink at this time. ED hallway very busy, will complete interview when pt moves to the floor.   Pricilla Holm, MSN, RN Heart Failure Nurse Navigator [phone]

## 2021-10-25 NOTE — ED Provider Notes (Signed)
[facility] EMERGENCY DEPARTMENT Provider Note   CSN: [id number] Arrival date & time: 10/25/21  0813     History Chief Complaint  Patient presents with   Tachycardia    Mark Coin. is a 87 y.o. male.  HPI 87 year old with history of HLD, HTN, prostate cancer, A. fib, presenting for tachycardia and shortness of breath.  Per chart review, he underwent DCCV 2 months ago.  He was then out of rhythm 2 days later.  He is on Eliquis, metoprolol (100 mg daily), and Lasix.  Last dose of metoprolol and Lasix was yesterday morning.  Last dose of Eliquis was last night.  He reports that he was in his normal state of health yesterday.  Today, at 5 AM, he woke up short of breath.  He denies any associated pain, pressure, tightness, or nausea.  EMS was called and they reported an initial heart rate of 110-120.  SPO2 was 97% on room air.  Patient's breathing was moderately labored.  Patient continues to endorse shortness of breath but denies any other current symptoms.    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[place])    Prostate cancer [facility])     Patient Active Problem List   Diagnosis Date Noted   Acute CHF (congestive heart failure) ([place]) 10/25/2021   Secondary hypercoagulable state ([place]) 08/23/2021   Prostate CA ([place]) 07/28/2021   Thrombocytopenia ([place]) 03/30/2021   Screening for HIV (human immunodeficiency virus) 03/30/2021   Monocytosis 03/30/2021   Abnormal platelets ([place]) 03/28/2021   Hip pain 03/28/2021   Persistent atrial fibrillation (HCC)    Paroxysmal atrial fibrillation ([place]) 01/12/2018   Cystoid macular edema of right eye 11/29/2017   Macular degeneration 12/21/2015   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 eye with active choroidal neovascularization ([place]) 04/30/2014   Myalgia and myositis 11/16/2013   Epiretinal  membrane 11/07/2012   Posterior vitreous detachment 11/07/2012   Status post intraocular lens implant 11/07/2012   Fungal ear infection 04/09/2012   Abnormal laboratory test 03/27/2011   AI (aortic insufficiency) 46/96/2952   SYSTOLIC MURMUR 84/13/2440   EDEMA 12/05/2010   ECCHYMOSES, SPONTANEOUS 11/28/2010   CONTUSION, RIGHT THIGH 11/28/2010   DECREASED HEARING, BILATERAL 09/23/2010   Pain in limb 07/28/2009   ANKLE SPRAIN, LEFT 07/28/2009   CONTUSION, LOWER LEG, LEFT 07/28/2009   DERMATITIS, CONTACT, NOS 10/02/2007   Essential hypertension 05/07/2007   PROSTATE CANCER, HX OF 03/13/2007   Hyperlipidemia 01/15/2007   Anxiety state 01/15/2007   DIVERTICULITIS, HX OF 01/15/2007    Past Surgical History:  Procedure Laterality Date   CARDIOVERSION N/A 03/01/2018   Procedure: CARDIOVERSION;  Surgeon: Troy Sine, MD;  Location: [facility] ENDOSCOPY;  Service: Cardiovascular;  Laterality: N/A;   CARDIOVERSION N/A 08/26/2021   Procedure: CARDIOVERSION;  Surgeon: Pixie Casino, MD;  Location: [facility] ENDOSCOPY;  Service: Cardiovascular;  Laterality: N/A;   CATARACT EXTRACTION  01/27/2010   right   CATARACT EXTRACTION  03/04/2010   left   Prostate seed implants     PROSTATE SURGERY         Family History  Problem Relation Age of Onset   Dementia Mother    Alzheimer's disease Mother    Mental illness Mother        ALZ DISEASE   Cancer Brother 34  pancreatic cancer    Social History   Tobacco Use   Smoking status: Never   Smokeless tobacco: Never  Vaping Use   Vaping Use: Never used  Substance Use Topics   Alcohol use: No    Comment: Little wine occasionally    Drug use: No    Home Medications Prior to Admission medications   Medication Sig Start Date End Date Taking? Authorizing Provider  ELIQUIS 5 MG TABS tablet TAKE 1 TABLET BY MOUTH TWICE A DAY 10/18/21   Roma Schanz R, DO  b complex vitamins tablet Take 1 tablet by mouth daily.    [provider]   Calcium Carbonate-Vitamin D 600-400 MG-UNIT tablet Take 1 tablet by mouth daily.    [provider]  clorazepate (TRANXENE) 15 MG tablet TAKE 1 TABLET BY MOUTH EVERY DAY AT BEDTIME AS NEEDED 09/26/21   [name], [name], DO  cromolyn (NASALCROM) 5.2 MG/ACT nasal spray Place 2 sprays into the nose 2 (two) times daily.    [provider]  doxazosin (CARDURA) 4 MG tablet Take 1 tablet (4 mg total) by mouth at bedtime. 07/28/21   Roma Schanz R, DO  fenofibrate 160 MG tablet TAKE 1 TABLET BY MOUTH EVERY DAY 04/28/21   [name], [name], DO  fish oil-omega-3 fatty acids 1000 MG capsule Take 1 g by mouth 3 (three) times daily.    [provider]  furosemide (LASIX) 20 MG tablet Take 1 tablet (20 mg total) by mouth daily. 10/18/21   Fenton, Clint R, PA  lisinopril (ZESTRIL) 20 MG tablet TAKE 1 TABLET BY MOUTH EVERY DAY 03/07/21   Troy Sine, MD  metoprolol (TOPROL-XL) 200 MG 24 hr tablet TAKE 1/2 TABLET BY MOUTH DAILY 04/28/21   [name], DO  Multiple Vitamins-Minerals (MULTIVITAMIN PO) Take 1 tablet by mouth daily.    [provider]  Multiple Vitamins-Minerals (PRESERVISION AREDS 2) CAPS 2 po qd 07/05/17   [name], [name], DO  potassium chloride (KLOR-CON) 10 MEQ tablet Take 1 tablet (10 mEq total) by mouth daily. 10/18/21 01/16/22  Fenton, Clint R, PA  simvastatin (ZOCOR) 20 MG tablet TAKE 1 TABLET BY MOUTH EVERYDAY AT BEDTIME 04/28/21   [name], [name], DO  vitamin C (ASCORBIC ACID) 500 MG tablet Take 500 mg by mouth daily.    [provider]    Allergies    Sulfonamide derivatives  Review of Systems   Review of Systems  Constitutional:  Negative for chills, fatigue and fever.  HENT:  Negative for congestion, ear pain and sore throat.   Eyes:  Negative for pain and visual disturbance.  Respiratory:  Positive for shortness of breath. Negative for cough, chest tightness, wheezing and stridor.   Cardiovascular:   Negative for chest pain and palpitations.  Gastrointestinal:  Negative for abdominal pain, nausea and vomiting.  Genitourinary:  Negative for dysuria, flank pain and hematuria.  Musculoskeletal:  Negative for arthralgias, back pain, myalgias and neck pain.  Skin:  Negative for color change and rash.  Neurological:  Negative for dizziness, seizures, syncope, weakness, light-headedness, numbness and headaches.  Hematological:  Bruises/bleeds easily (On Eliquis).  Psychiatric/Behavioral:  Negative for confusion and decreased concentration.   All other systems reviewed and are negative.  Physical Exam Updated Vital Signs BP (!) 141/97 (BP Location: Right Arm)   Pulse 92   Temp (!) 97.4 F (36.3 C) (Oral)   Resp 17   SpO2 97%  Physical Exam Vitals and nursing note reviewed.  Constitutional:      General: He is not in acute distress.    Appearance: Normal appearance. He is well-developed and normal weight. He is not ill-appearing, toxic-appearing or diaphoretic.  HENT:     Head: Normocephalic and atraumatic.     Right Ear: External ear normal.     Left Ear: External ear normal.     Nose: Nose normal.  Eyes:     Extraocular Movements: Extraocular movements intact.     Conjunctiva/sclera: Conjunctivae normal.  Cardiovascular:     Rate and Rhythm: Normal rate. Rhythm irregular.     Heart sounds: No murmur heard. Pulmonary:     Effort: Pulmonary effort is normal. No respiratory distress.     Breath sounds: Rales present. No wheezing.  Chest:     Chest wall: No tenderness.  Abdominal:     General: Abdomen is flat.     Palpations: Abdomen is soft.     Tenderness: There is no abdominal tenderness.  Musculoskeletal:        General: No tenderness.     Cervical back: Normal range of motion and neck supple. No rigidity.     Right lower leg: Edema present.     Left lower leg: Edema present.  Skin:    General: Skin is warm and dry.     Coloration: Skin is not jaundiced or pale.   Neurological:     General: No focal deficit present.     Mental Status: He is alert and oriented to person, place, and time.     Cranial Nerves: No cranial nerve deficit.     Sensory: No sensory deficit.     Motor: No weakness.  Psychiatric:        Mood and Affect: Mood normal.        Behavior: Behavior normal.        Thought Content: Thought content normal.        Judgment: Judgment normal.    ED Results / Procedures / Treatments   Labs (all labs ordered are listed, but only abnormal results are displayed) Labs Reviewed  COMPREHENSIVE METABOLIC PANEL - Abnormal; Notable for the following components:      Result Value   Sodium 132 (*)    Glucose, Bld 106 (*)    Creatinine, Ser 1.29 (*)    Calcium 8.2 (*)    Total Protein 5.6 (*)    Albumin 3.4 (*)    Alkaline Phosphatase 34 (*)    GFR, Estimated 52 (*)    All other components within normal limits  CBC WITH DIFFERENTIAL/PLATELET - Abnormal; Notable for the following components:   RBC 3.87 (*)    Hemoglobin 12.3 (*)    HCT 37.3 (*)    Platelets 144 (*)    All other components within normal limits  BRAIN NATRIURETIC PEPTIDE - Abnormal; Notable for the following components:   B Natriuretic Peptide 623.5 (*)    All other components within normal limits  I-STAT CHEM 8, ED - Abnormal; Notable for the following components:   BUN 24 (*)    Creatinine, Ser 1.30 (*)    Hemoglobin 12.9 (*)    HCT 38.0 (*)    All other components within normal limits  RESP PANEL BY RT-PCR (FLU A&B, COVID) ARPGX2  URINALYSIS, ROUTINE W REFLEX MICROSCOPIC  MAGNESIUM  TSH  BASIC METABOLIC PANEL  TROPONIN I (HIGH SENSITIVITY)  TROPONIN I (HIGH SENSITIVITY)    EKG EKG Interpretation  Date/Time:  Tuesday October 25 2021 08:21:31 EDT Ventricular Rate:  76 PR Interval:    QRS Duration: 80 QT Interval:  372 QTC Calculation: 419 R Axis:   71 Text Interpretation: Atrial fibrillation Confirmed by Godfrey Pick [phone]) on 10/25/2021 8:28:49  AM  Radiology DG Chest Port 1 View  Result Date: 10/25/2021 CLINICAL DATA:  Dyspnea. EXAM: PORTABLE CHEST 1 VIEW COMPARISON:  06/25/2015 FINDINGS: Stable cardiomediastinal contours. Small bilateral pleural effusions and mild interstitial edema identified. No airspace consolidation. Lung volumes appear low. Visualized osseous structures appear intact. IMPRESSION: 1. Mild CHF. 2. Low lung volumes. Electronically Signed   By: Kerby Moors M.D.   On: 10/25/2021 08:47    Procedures Procedures   Medications Ordered in ED Medications  sodium chloride flush (NS) 0.9 % injection 3 mL (3 mLs Intravenous Not Given 10/25/21 1511)  sodium chloride flush (NS) 0.9 % injection 3 mL (has no administration in time range)  0.9 %  sodium chloride infusion (has no administration in time range)  acetaminophen (TYLENOL) tablet 650 mg (has no administration in time range)  ondansetron (ZOFRAN) injection 4 mg (has no administration in time range)  furosemide (LASIX) injection 40 mg (40 mg Intravenous Given 10/25/21 1522)  apixaban (ELIQUIS) tablet 5 mg (5 mg Oral Given 10/25/21 1521)    ED Course  I have reviewed the triage vital signs and the nursing notes.  Pertinent labs & imaging results that were available during my care of the patient were reviewed by me and considered in my medical decision making (see chart for details).    MDM Rules/Calculators/A&P                          Patient with history of A. fib presenting for acute onset of shortness of breath this morning.  He also states that he has had exercise intolerance over the past week.  At baseline, he is highly functional 87 year old.  He typically walks 60 minutes every day.  He does housework, including gardening at home.  EMS reported a heart rate of 110-120 on scene.  On arrival in the ED, heart rate is normal at rest.  Rhythm shows atrial fibrillation.  When patient exerts himself at all, including just standing at bedside, heart rate goes up  into the 120s.  He also becomes short of breath with even mild exertion.  Given normal heart rate at rest, no rate control given.  Patient's BNP is 623.  Although he does take Lasix, it does not appear that he has ever been diagnosed with heart failure.  History, chest x-ray, and BNP consistent with likely new heart failure.  Patient's last echocardiogram was 3 years ago.  Patient admitted to cardiology for further diagnostic testing and medication optimization.  Final Clinical Impression(s) / ED Diagnoses Final diagnoses:  Longstanding persistent atrial fibrillation (HCC)  Exercise intolerance  Exertional dyspnea    Rx / DC Orders ED Discharge Orders     None        Godfrey Pick, MD 10/25/21 1703

## 2021-10-25 NOTE — Progress Notes (Signed)
[facility] encountered pt. in ED while rounding.  Pt. lying in bed in hallway; he shared that he has been experiencing severe shortness of breath and weakness and that he called 911 this AM because he was concerned.  Pt. says he is normally very active.  Pt. lives in housing leased from [facility] where he retired from work as professor of sociology and demography.  Pt.'s wife died 43yrs ago and he lives alone, but says that he has good support from his community, including from one former student who has "adopted" him as family.  Pt. has a background in [place].  No immediate needs shared at this time, but pt. expressed gratitude for visit.  Chaplains remain available.  Lindaann Pascal, Chaplain Pager: [phone]

## 2021-10-26 ENCOUNTER — Inpatient Hospital Stay (HOSPITAL_COMMUNITY): Payer: Medicare Other

## 2021-10-26 DIAGNOSIS — I340 Nonrheumatic mitral (valve) insufficiency: Secondary | ICD-10-CM

## 2021-10-26 DIAGNOSIS — R0609 Other forms of dyspnea: Secondary | ICD-10-CM

## 2021-10-26 LAB — ECHOCARDIOGRAM COMPLETE
AR max vel: 1.76 cm2
AV Area VTI: 1.67 cm2
AV Area mean vel: 1.72 cm2
AV Mean grad: 7.7 mmHg
AV Peak grad: 12.9 mmHg
Ao pk vel: 1.8 m/s
MV M vel: 5.56 m/s
MV Peak grad: 123.4 mmHg
P 1/2 time: 479 msec
Radius: 0.7 cm
S' Lateral: 3.8 cm

## 2021-10-26 LAB — BASIC METABOLIC PANEL
Anion gap: 7 (ref 5–15)
BUN: 21 mg/dL (ref 8–23)
CO2: 29 mmol/L (ref 22–32)
Calcium: 8.9 mg/dL (ref 8.9–10.3)
Chloride: 102 mmol/L (ref 98–111)
Creatinine, Ser: 1.32 mg/dL — ABNORMAL HIGH (ref 0.61–1.24)
GFR, Estimated: 51 mL/min — ABNORMAL LOW (ref >60)
Glucose, Bld: 94 mg/dL (ref 70–99)
Potassium: 3.6 mmol/L (ref 3.5–5.1)
Sodium: 138 mmol/L (ref 135–145)

## 2021-10-26 MED ORDER — FUROSEMIDE 10 MG/ML IJ SOLN
40.0000 mg | Freq: Every day | INTRAMUSCULAR | Status: DC
  Administered 2021-10-26: 40 mg via INTRAVENOUS
  Filled 2021-10-26 (×2): qty 4

## 2021-10-26 MED ORDER — POTASSIUM CHLORIDE 20 MEQ PO PACK
40.0000 meq | PACK | Freq: Once | ORAL | Status: AC
  Administered 2021-10-26: 40 meq via ORAL
  Filled 2021-10-26: qty 2

## 2021-10-26 NOTE — Progress Notes (Signed)
Progress Note  Patient Name: Mark Stark. Date of Encounter: 10/26/2021  [facility] Cardiologist: Shelva Majestic, MD   Subjective   Tired. Feels like he is dehydrated. Having bedside echo done at the time of my arrival. Reviewed options for management, see below.  Inpatient Medications    Scheduled Meds:  apixaban  5 mg Oral BID   B-complex with vitamin C  1 tablet Oral Daily   calcium-vitamin D  1 tablet Oral Q breakfast   doxazosin  4 mg Oral QHS   fenofibrate  160 mg Oral Daily   furosemide  40 mg Intravenous Daily   metoprolol  100 mg Oral Daily   omega-3 acid ethyl esters  1 g Oral BID   potassium chloride  10 mEq Oral Daily   potassium chloride  40 mEq Oral Once   simvastatin  20 mg Oral q1800   sodium chloride flush  3 mL Intravenous Q12H   Continuous Infusions:  sodium chloride     PRN Meds: sodium chloride, acetaminophen, clorazepate, ondansetron (ZOFRAN) IV, sodium chloride flush   Vital Signs    Vitals:   10/26/21 0200 10/26/21 0400 10/26/21 0735 10/26/21 0915  BP: 127/72 137/86  113/82  Pulse: (!) 111 96 90 91  Resp: 16 13 15 17   Temp:      TempSrc:      SpO2: 91% 93% 95% 100%    Intake/Output Summary (Last 24 hours) at 10/26/2021 1037 Last data filed at 10/26/2021 0918 Gross per 24 hour  Intake 475 ml  Output 3900 ml  Net -3425 ml   Last 3 Weights 10/13/2021 09/01/2021 08/26/2021  Weight (lbs) 166 lb 9.6 oz 160 lb 6.4 oz 154 lb 6.4 oz  Weight (kg) 75.569 kg 72.757 kg 70.035 kg      Telemetry    Atrial fibrillation, rate controlled in the 80s - Personally Reviewed  ECG    No new - Personally Reviewed  Physical Exam   GEN: No acute distress.   Neck: No JVD appreciated at 45 degrees Cardiac: RRR, no murmurs, rubs, or gallops.  Respiratory: Clear to auscultation bilaterally in upper fields, reduced at bilateral bases GI: Soft, nontender, non-distended  MS: edema improved from yesterday but still 2+ mid calve to feet  bilaterally Neuro:  Nonfocal  Psych: Normal affect   Labs    High Sensitivity Troponin:   Recent Labs  Lab 10/25/21 0830 10/25/21 1158  TROPONINIHS 10 9     Chemistry Recent Labs  Lab 10/25/21 0830 10/25/21 0905 10/26/21 0351  NA 132* 136 138  K 4.2 4.2 3.6  CL 98 100 102  CO2 25  --  29  GLUCOSE 106* 97 94  BUN 23 24* 21  CREATININE 1.29* 1.30* 1.32*  CALCIUM 8.2*  --  8.9  MG 1.8  --   --   PROT 5.6*  --   --   ALBUMIN 3.4*  --   --   AST 30  --   --   ALT 22  --   --   ALKPHOS 34*  --   --   BILITOT 1.1  --   --   GFRNONAA 52*  --  51*  ANIONGAP 9  --  7    Lipids No results for input(s): CHOL, TRIG, HDL, LABVLDL, LDLCALC, CHOLHDL in the last 168 hours.  Hematology Recent Labs  Lab 10/25/21 0830 10/25/21 0905  WBC 7.7  --   RBC 3.87*  --  HGB 12.3* 12.9*  HCT 37.3* 38.0*  MCV 96.4  --   MCH 31.8  --   MCHC 33.0  --   RDW 13.4  --   PLT 144*  --    Thyroid  Recent Labs  Lab 10/25/21 1830  TSH 2.517    BNP Recent Labs  Lab 10/25/21 0830  BNP 623.5*    DDimer No results for input(s): DDIMER in the last 168 hours.   Radiology    DG Chest Port 1 View  Result Date: 10/25/2021 CLINICAL DATA:  Dyspnea. EXAM: PORTABLE CHEST 1 VIEW COMPARISON:  06/25/2015 FINDINGS: Stable cardiomediastinal contours. Small bilateral pleural effusions and mild interstitial edema identified. No airspace consolidation. Lung volumes appear low. Visualized osseous structures appear intact. IMPRESSION: 1. Mild CHF. 2. Low lung volumes. Electronically Signed   By: Kerby Moors M.D.   On: 10/25/2021 08:47    Cardiac Studies   Echo pending today  Patient Profile     87 y.o. male with PMH atrial fibrillation, hypertension, hyperlipidemia who presented with shortness of breath and was found to be in acute heart failure.  Assessment & Plan    Acute heart failure, unknown if systolic or diastolic -NYHA class 3 on admission -echo pending, prior EF in 2019 preserved. I  looked at some of the bedside images. Overall EF appears preserved, but there is significant MR. There is also left atrial dilation. Given this, I do not think cardioversion without an antiarrhythmic is likely to be successful. We discussed both amiodarone and tikosyn today. He is hesitant. Will think about it. -net negative >3L in 24 hours. No weights measured. Will slow IV lasix to daily -Cr 1.32 today, K 3.6, will replete K with additional 40 mEq added to his 10 mEq given continued diuresis and afib  Atrial fibrillation -CHA2DS2/VAS Stroke Risk Points=4  -continue apixaban 5 mg BID -see discussion re: cardioversion and antiarrhythmics above -no missed apixaban (got a little late yesterday AM but still got both doses).   Hyperlipidemia -continue simvastatin, reasonable for age -on fenofibrate as well  Hypertension -continue metoprolol, he is warm and well perfused -takes cardura at bedtime, reviewed risk of orthostasis  For questions or updates, please contact [place] Please consult [email or web address] for contact info under        Signed, Buford Dresser, MD  10/26/2021, 10:37 AM

## 2021-10-26 NOTE — Progress Notes (Signed)
  Echocardiogram 2D Echocardiogram has been performed.  Mark Stark 10/26/2021, 10:39 AM

## 2021-10-26 NOTE — Progress Notes (Signed)
Mobility Specialist Progress Note    10/26/21 1302  Mobility  Activity Ambulated in hall  Level of Assistance Independent  Assistive Device None  Distance Ambulated (ft) 340 ft  Mobility Ambulated independently in hallway  Mobility Response Tolerated well  Mobility performed by Mobility specialist  Bed Position Chair  $Mobility charge 1 Mobility   Pre-Mobility: 91 HR, 118/81 BP, 95% SpO2 During Mobility: 97% SpO2 Post-Mobility: 112 HR, 97% SpO2  Pt received in chair and agreeable. No complaints on walk. Returned to chair with call bell in reach.   Hildred Alamin Mobility Specialist  Mobility Specialist Phone: [phone]

## 2021-10-26 NOTE — ED Notes (Signed)
Breakfast Orders placed 

## 2021-10-27 ENCOUNTER — Telehealth: Payer: Self-pay | Admitting: Cardiovascular Disease

## 2021-10-27 DIAGNOSIS — R0609 Other forms of dyspnea: Secondary | ICD-10-CM

## 2021-10-27 LAB — BASIC METABOLIC PANEL
Anion gap: 9 (ref 5–15)
BUN: 26 mg/dL — ABNORMAL HIGH (ref 8–23)
CO2: 27 mmol/L (ref 22–32)
Calcium: 8.6 mg/dL — ABNORMAL LOW (ref 8.9–10.3)
Chloride: 99 mmol/L (ref 98–111)
Creatinine, Ser: 1.43 mg/dL — ABNORMAL HIGH (ref 0.61–1.24)
GFR, Estimated: 46 mL/min — ABNORMAL LOW (ref >60)
Glucose, Bld: 105 mg/dL — ABNORMAL HIGH (ref 70–99)
Potassium: 3.6 mmol/L (ref 3.5–5.1)
Sodium: 135 mmol/L (ref 135–145)

## 2021-10-27 LAB — CBC
HCT: 38.3 % — ABNORMAL LOW (ref 39.0–52.0)
Hemoglobin: 12.9 g/dL — ABNORMAL LOW (ref 13.0–17.0)
MCH: 31 pg (ref 26.0–34.0)
MCHC: 33.7 g/dL (ref 30.0–36.0)
MCV: 92.1 fL (ref 80.0–100.0)
Platelets: 155 10*3/uL (ref 150–400)
RBC: 4.16 MIL/uL — ABNORMAL LOW (ref 4.22–5.81)
RDW: 13.2 % (ref 11.5–15.5)
WBC: 7.5 10*3/uL (ref 4.0–10.5)
nRBC: 0 % (ref 0.0–0.2)

## 2021-10-27 MED ORDER — TORSEMIDE 20 MG PO TABS
20.0000 mg | ORAL_TABLET | Freq: Every day | ORAL | Status: DC
Start: 2021-10-27 — End: 2021-10-28
  Administered 2021-10-27: 20 mg via ORAL
  Filled 2021-10-27: qty 1

## 2021-10-27 MED ORDER — EPINEPHRINE PF 1 MG/ML IJ SOLN
INTRAMUSCULAR | Status: AC
  Filled 2021-10-27: qty 1

## 2021-10-27 MED ORDER — AMIODARONE HCL 200 MG PO TABS
200.0000 mg | ORAL_TABLET | Freq: Two times a day (BID) | ORAL | Status: DC
  Administered 2021-10-27 – 2021-10-28 (×3): 200 mg via ORAL
  Filled 2021-10-27 (×3): qty 1

## 2021-10-27 MED ORDER — LIDOCAINE HCL (PF) 1 % IJ SOLN
INTRAMUSCULAR | Status: AC
  Filled 2021-10-27: qty 5

## 2021-10-27 NOTE — Plan of Care (Signed)

## 2021-10-27 NOTE — Telephone Encounter (Signed)
Per staff message from Dr. Harrell Gave, patient is requesting to switch from Dr. Claiborne Billings to her. She has approved of this as long as it is ok with Dr. Claiborne Billings. Please advise.

## 2021-10-27 NOTE — Progress Notes (Addendum)
Progress Note  Patient Name: Mark Stark. Date of Encounter: 10/27/2021  [facility] Cardiologist: Shelva Majestic, MD   Subjective   Feeling much improved today, able to ambulate to the bathroom with much less shortness of breath. He has many questions today, which we discussed at length. He has thought about it and wants to start amiodarone. We also reviewed his echo results and discussed next steps.   Inpatient Medications    Scheduled Meds:  amiodarone  200 mg Oral BID   apixaban  5 mg Oral BID   B-complex with vitamin C  1 tablet Oral Daily   calcium-vitamin D  1 tablet Oral Q breakfast   doxazosin  4 mg Oral QHS   fenofibrate  160 mg Oral Daily   metoprolol  100 mg Oral Daily   omega-3 acid ethyl esters  1 g Oral BID   potassium chloride  10 mEq Oral Daily   simvastatin  20 mg Oral q1800   sodium chloride flush  3 mL Intravenous Q12H   torsemide  20 mg Oral Daily   Continuous Infusions:  sodium chloride     PRN Meds: sodium chloride, acetaminophen, clorazepate, ondansetron (ZOFRAN) IV, sodium chloride flush   Vital Signs    Vitals:   10/27/21 0300 10/27/21 0351 10/27/21 0554 10/27/21 0749  BP:  108/60  (!) 108/55  Pulse: 84 70  93  Resp:  20  18  Temp:  (!) 97.5 F (36.4 C)  97.7 F (36.5 C)  TempSrc:  Oral  Oral  SpO2: 94% 97%  95%  Weight:   69 kg     Intake/Output Summary (Last 24 hours) at 10/27/2021 1224 Last data filed at 10/27/2021 0352 Gross per 24 hour  Intake 240 ml  Output 2500 ml  Net -2260 ml   Last 3 Weights 10/27/2021 10/13/2021 09/01/2021  Weight (lbs) 152 lb 1.9 oz 166 lb 9.6 oz 160 lb 6.4 oz  Weight (kg) 69 kg 75.569 kg 72.757 kg      Telemetry    Atrial fibrillation, rate controlled in the 80s - Personally Reviewed  ECG    No new - Personally Reviewed  Physical Exam   GEN: Well nourished, well developed in no acute distress HEENT: Normal, moist mucous membranes NECK: No JVD CARDIAC: irregularly irregular rhythm, normal  S1 and S2, no rubs or gallops. No murmur. VASCULAR: Radial and DP pulses 2+ bilaterally. No carotid bruits RESPIRATORY:  Clear to auscultation without rales, wheezing or rhonchi  ABDOMEN: Soft, non-tender, non-distended MUSCULOSKELETAL:  Ambulates independently SKIN: Warm and dry, edema significantly improved, remains 1-2+ at ankles but nearly resolved mid calf and above. NEUROLOGIC:  Alert and oriented x 3. No focal neuro deficits noted. PSYCHIATRIC:  Normal affect    Labs    High Sensitivity Troponin:   Recent Labs  Lab 10/25/21 0830 10/25/21 1158  TROPONINIHS 10 9     Chemistry Recent Labs  Lab 10/25/21 0830 10/25/21 0905 10/26/21 0351 10/27/21 0113  NA 132* 136 138 135  K 4.2 4.2 3.6 3.6  CL 98 100 102 99  CO2 25  --  29 27  GLUCOSE 106* 97 94 105*  BUN 23 24* 21 26*  CREATININE 1.29* 1.30* 1.32* 1.43*  CALCIUM 8.2*  --  8.9 8.6*  MG 1.8  --   --   --   PROT 5.6*  --   --   --   ALBUMIN 3.4*  --   --   --  AST 30  --   --   --   ALT 22  --   --   --   ALKPHOS 34*  --   --   --   BILITOT 1.1  --   --   --   GFRNONAA 52*  --  51* 46*  ANIONGAP 9  --  7 9    Lipids No results for input(s): CHOL, TRIG, HDL, LABVLDL, LDLCALC, CHOLHDL in the last 168 hours.  Hematology Recent Labs  Lab 10/25/21 0830 10/25/21 0905 10/27/21 0113  WBC 7.7  --  7.5  RBC 3.87*  --  4.16*  HGB 12.3* 12.9* 12.9*  HCT 37.3* 38.0* 38.3*  MCV 96.4  --  92.1  MCH 31.8  --  31.0  MCHC 33.0  --  33.7  RDW 13.4  --  13.2  PLT 144*  --  155   Thyroid  Recent Labs  Lab 10/25/21 1830  TSH 2.517    BNP Recent Labs  Lab 10/25/21 0830  BNP 623.5*    DDimer No results for input(s): DDIMER in the last 168 hours.   Radiology    ECHOCARDIOGRAM COMPLETE  Result Date: 10/26/2021    ECHOCARDIOGRAM REPORT   Patient Name:   Mark Stark Date of Exam: 10/26/2021 Medical Rec #:  [id number]   Height:       68.0 in Accession #:    [id number]  Weight:       166.6 lb Date of Birth:  04-Apr-1928     BSA:          1.891 m Patient Age:    44 years    BP:           137/86 mmHg Patient Gender: M           HR:           97 bpm. Exam Location:  Inpatient Procedure: 2D Echo, Cardiac Doppler and Color Doppler Indications:    Dyspnea  History:        Patient has prior history of Echocardiogram examinations, most                 recent 01/21/2018. Arrythmias:Atrial Fibrillation; Risk                 Factors:Hypertension and Dyslipidemia.  Sonographer:    Bernadene Person RDCS Referring Phys: [place]  1. Left ventricular ejection fraction, by estimation, is 50 to 55%. The left ventricle has normal function. The left ventricle has no regional wall motion abnormalities. There is mild left ventricular hypertrophy. Left ventricular diastolic parameters are indeterminate.  2. Right ventricular systolic function is normal. The right ventricular size is normal. There is moderately elevated pulmonary artery systolic pressure.  3. Left atrial size was moderately dilated.  4. Posterior leaflet appears proplapsed. MR is moderate to severe, the jet is eccentric and may underestiamte the severity.  5. Aortic valve is moderately calcified. The aortic valve is normal in structure. Aortic valve regurgitation is moderate. No aortic stenosis is present.  6. The inferior vena cava is dilated in size with >50% respiratory variability, suggesting right atrial pressure of 8 mmHg. Comparison(s): No prior Echocardiogram. Conclusion(s)/Recommendation(s): Patient has moderate to severe mitral regurgitation; consider TEE if clinically indicated. FINDINGS  Left Ventricle: Left ventricular ejection fraction, by estimation, is 50 to 55%. The left ventricle has normal function. The left ventricle has no regional wall motion abnormalities. The left ventricular internal cavity size  was normal in size. There is  mild left ventricular hypertrophy. Left ventricular diastolic parameters are indeterminate. Right Ventricle: The right  ventricular size is normal. No increase in right ventricular wall thickness. Right ventricular systolic function is normal. There is moderately elevated pulmonary artery systolic pressure. The tricuspid regurgitant velocity is 3.20 m/s, and with an assumed right atrial pressure of 8 mmHg, the estimated right ventricular systolic pressure is 77.4 mmHg. Left Atrium: Left atrial size was moderately dilated. Right Atrium: Right atrial size was normal in size. Pericardium: There is no evidence of pericardial effusion. Mitral Valve: Posterior leaflet appears proplapsed. MR is moderate to severe, the jet is eccentric and may underestiamte the severity. Tricuspid Valve: The tricuspid valve is normal in structure. Tricuspid valve regurgitation is mild. Aortic Valve: Aortic valve is moderately calcified. The aortic valve is normal in structure. Aortic valve regurgitation is moderate. Aortic regurgitation PHT measures 479 msec. No aortic stenosis is present. Aortic valve mean gradient measures 7.7 mmHg. Aortic valve peak gradient measures 12.9 mmHg. Aortic valve area, by VTI measures 1.67 cm. Pulmonic Valve: The pulmonic valve was normal in structure. Pulmonic valve regurgitation is mild. Aorta: The aortic root and ascending aorta are structurally normal, with no evidence of dilitation. Venous: The inferior vena cava is dilated in size with greater than 50% respiratory variability, suggesting right atrial pressure of 8 mmHg. IAS/Shunts: No atrial level shunt detected by color flow Doppler.  LEFT VENTRICLE PLAX 2D LVIDd:         5.30 cm LVIDs:         3.80 cm LV PW:         1.10 cm LV IVS:        1.10 cm LVOT diam:     2.10 cm LV SV:         57 LV SV Index:   30 LVOT Area:     3.46 cm  RIGHT VENTRICLE TAPSE (M-mode): 1.9 cm LEFT ATRIUM             Index        RIGHT ATRIUM           Index LA diam:        6.20 cm 3.28 cm/m   RA Area:     20.30 cm LA Vol (A2C):   78.3 ml 41.41 ml/m  RA Volume:   49.60 ml  26.23 ml/m LA Vol  (A4C):   83.9 ml 44.37 ml/m LA Biplane Vol: 84.4 ml 44.63 ml/m  AORTIC VALVE AV Area (Vmax):    1.76 cm AV Area (Vmean):   1.72 cm AV Area (VTI):     1.67 cm AV Vmax:           179.67 cm/s AV Vmean:          126.000 cm/s AV VTI:            0.344 m AV Peak Grad:      12.9 mmHg AV Mean Grad:      7.7 mmHg LVOT Vmax:         91.17 cm/s LVOT Vmean:        62.567 cm/s LVOT VTI:          0.166 m LVOT/AV VTI ratio: 0.48 AI PHT:            479 msec  AORTA Ao Root diam: 3.70 cm Ao Asc diam:  3.60 cm MR Peak grad:    123.4 mmHg   TRICUSPID VALVE MR Mean  grad:    91.5 mmHg    TR Peak grad:   41.0 mmHg MR Vmax:         555.50 cm/s  TR Vmax:        320.00 cm/s MR Vmean:        462.0 cm/s MR PISA:         3.08 cm     SHUNTS MR PISA Eff ROA: 21 mm       Systemic VTI:  0.17 m MR PISA Radius:  0.70 cm      Systemic Diam: 2.10 cm Phineas Inches Electronically signed by Phineas Inches Signature Date/Time: 10/26/2021/3:04:48 PM    Final     Cardiac Studies   Echo 10/26/21 1. Left ventricular ejection fraction, by estimation, is 50 to 55%. The  left ventricle has normal function. The left ventricle has no regional  wall motion abnormalities. There is mild left ventricular hypertrophy.  Left ventricular diastolic parameters  are indeterminate.   2. Right ventricular systolic function is normal. The right ventricular  size is normal. There is moderately elevated pulmonary artery systolic  pressure.   3. Left atrial size was moderately dilated.   4. Posterior leaflet appears proplapsed. MR is moderate to severe, the  jet is eccentric and may underestiamte the severity.   5. Aortic valve is moderately calcified. The aortic valve is normal in  structure. Aortic valve regurgitation is moderate. No aortic stenosis is  present.   6. The inferior vena cava is dilated in size with >50% respiratory  variability, suggesting right atrial pressure of 8 mmHg.   Comparison(s): No prior Echocardiogram.   Patient Profile     87  y.o. male with PMH atrial fibrillation, hypertension, hyperlipidemia who presented with shortness of breath and was found to be in acute heart failure.  Assessment & Plan    Acute heart failure, diastolic Moderate to severe mitral regurgitation -NYHA class 3 on admission, now NYHA class II -echo with low normal EF, moderate to severe MR -net negative >5L in 48 hours. No admission weight, weight today 69 kg.  -Cr 1.43 today, K 3.6. Will change to oral torsemide given impressive diuresis and creatinine rise.  Atrial fibrillation -CHA2DS2/VAS Stroke Risk Points=4  -continue apixaban 5 mg BID -he has considered options and wishes to start amiodarone after discussing with multiple people. He is most concerned about nausea, so we will start with a low dose load (200 mg BID) until he is seen for follow up. If still in afib, can then decrease dose to 200 mg daily and plan for outpatient cardioveresion -no missed apixaban (got a little late 11/1 AM but still got both doses).   Hyperlipidemia -continue simvastatin, reasonable for age -on fenofibrate as well  Hypertension -continue metoprolol, he is warm and well perfused -takes cardura at bedtime, reviewed risk of orthostasis  If he tolerates torsemide and amiodarone this afternoon, we will plan for discharge later today vs tomorrow based on his response. He would like to see me at the [facility] office as it is more convenient for him, will confirm this is ok with Dr. Claiborne Billings and schedule him for follow up.  For questions or updates, please contact [place] Please consult [email or web address] for contact info under        Signed, Buford Dresser, MD  10/27/2021, 12:24 PM

## 2021-10-27 NOTE — Plan of Care (Signed)

## 2021-10-27 NOTE — Progress Notes (Signed)
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 Pt lives alone at home. Has good social support of family (son lives ~272miles away), past colleagues and previous students. Plan for DC tomorrow 11/4 as pt was started on new medications today. Pt ambulates independently in room.  Pt states he would like to change his primary cardiologist to Dr. Venida Jarvis from Dr. Claiborne Billings. States he felt a good connection with her through this hospitalization.  Pl[facility] with [facility] upon discharge. Offered HV TOC appt if unable to get quick follow up.   Pt declined HV TOC clinic appt.   Pricilla Holm, MSN, RN Heart Failure Nurse Navigator [phone]

## 2021-10-28 ENCOUNTER — Other Ambulatory Visit: Payer: Self-pay | Admitting: Physician Assistant

## 2021-10-28 ENCOUNTER — Telehealth: Payer: Self-pay | Admitting: Physician Assistant

## 2021-10-28 ENCOUNTER — Encounter (HOSPITAL_COMMUNITY): Payer: Self-pay | Admitting: Cardiology

## 2021-10-28 DIAGNOSIS — I340 Nonrheumatic mitral (valve) insufficiency: Secondary | ICD-10-CM

## 2021-10-28 DIAGNOSIS — I5032 Chronic diastolic (congestive) heart failure: Secondary | ICD-10-CM

## 2021-10-28 DIAGNOSIS — I5031 Acute diastolic (congestive) heart failure: Secondary | ICD-10-CM

## 2021-10-28 DIAGNOSIS — N179 Acute kidney failure, unspecified: Secondary | ICD-10-CM

## 2021-10-28 HISTORY — DX: Nonrheumatic mitral (valve) insufficiency: I34.0

## 2021-10-28 LAB — CBC
HCT: 42 % (ref 39.0–52.0)
Hemoglobin: 13.8 g/dL (ref 13.0–17.0)
MCH: 30.5 pg (ref 26.0–34.0)
MCHC: 32.9 g/dL (ref 30.0–36.0)
MCV: 92.7 fL (ref 80.0–100.0)
Platelets: 174 10*3/uL (ref 150–400)
RBC: 4.53 MIL/uL (ref 4.22–5.81)
RDW: 13 % (ref 11.5–15.5)
WBC: 7.9 10*3/uL (ref 4.0–10.5)
nRBC: 0 % (ref 0.0–0.2)

## 2021-10-28 LAB — BASIC METABOLIC PANEL
Anion gap: 7 (ref 5–15)
BUN: 30 mg/dL — ABNORMAL HIGH (ref 8–23)
CO2: 31 mmol/L (ref 22–32)
Calcium: 9.1 mg/dL (ref 8.9–10.3)
Chloride: 99 mmol/L (ref 98–111)
Creatinine, Ser: 1.53 mg/dL — ABNORMAL HIGH (ref 0.61–1.24)
GFR, Estimated: 43 mL/min — ABNORMAL LOW (ref >60)
Glucose, Bld: 104 mg/dL — ABNORMAL HIGH (ref 70–99)
Potassium: 4.1 mmol/L (ref 3.5–5.1)
Sodium: 137 mmol/L (ref 135–145)

## 2021-10-28 MED ORDER — SIMVASTATIN 10 MG PO TABS: 10.0000 mg | ORAL_TABLET | Freq: Every day | ORAL | 3 refills | Status: DC

## 2021-10-28 MED ORDER — SIMVASTATIN 20 MG PO TABS: 10.0000 mg | ORAL_TABLET | Freq: Every day | ORAL | Status: DC

## 2021-10-28 MED ORDER — TORSEMIDE 20 MG PO TABS: 20.0000 mg | ORAL_TABLET | Freq: Every day | ORAL | 3 refills | Status: DC

## 2021-10-28 MED ORDER — TORSEMIDE 20 MG PO TABS: 20.0000 mg | ORAL_TABLET | Freq: Every day | ORAL | Status: DC

## 2021-10-28 MED ORDER — LIVING BETTER WITH HEART FAILURE BOOK: Freq: Once | Status: DC

## 2021-10-28 MED ORDER — POTASSIUM CHLORIDE CRYS ER 10 MEQ PO TBCR: 10.0000 meq | EXTENDED_RELEASE_TABLET | Freq: Every day | ORAL | Status: DC

## 2021-10-28 MED ORDER — AMIODARONE HCL 200 MG PO TABS: 200.0000 mg | ORAL_TABLET | Freq: Two times a day (BID) | ORAL | 3 refills | Status: DC

## 2021-10-28 NOTE — Telephone Encounter (Signed)
Currently admitted. TOC- call 11/7

## 2021-10-28 NOTE — Progress Notes (Addendum)
Opened in error

## 2021-10-28 NOTE — Progress Notes (Signed)
Progress Note  Patient Name: Mark Stark. Date of Encounter: 10/28/2021  [facility] Cardiologist: Buford Dresser, MD   Subjective   Tolerated amiodarone and torsemide yesterday, though Cr up slightly today. Holding today's dose of torsemide. We discussed the plan and what to monitor for.   Inpatient Medications    Scheduled Meds:  amiodarone  200 mg Oral BID   apixaban  5 mg Oral BID   B-complex with vitamin C  1 tablet Oral Daily   calcium-vitamin D  1 tablet Oral Q breakfast   doxazosin  4 mg Oral QHS   fenofibrate  160 mg Oral Daily   metoprolol  100 mg Oral Daily   omega-3 acid ethyl esters  1 g Oral BID   [START ON 10/29/2021] potassium chloride  10 mEq Oral Daily   simvastatin  20 mg Oral q1800   sodium chloride flush  3 mL Intravenous Q12H   [START ON 10/29/2021] torsemide  20 mg Oral Daily   Continuous Infusions:  sodium chloride     PRN Meds: sodium chloride, acetaminophen, clorazepate, ondansetron (ZOFRAN) IV, sodium chloride flush   Vital Signs    Vitals:   10/28/21 0332 10/28/21 0400 10/28/21 0619 10/28/21 0714  BP: 102/60   (!) 122/96  Pulse: 75     Resp: 17     Temp: 97.6 F (36.4 C)   97.6 F (36.4 C)  TempSrc: Oral   Oral  SpO2: 98%     Weight:   67.8 kg   Height:  5\' 8"  (1.727 m)      Intake/Output Summary (Last 24 hours) at 10/28/2021 0848 Last data filed at 10/28/2021 0800 Gross per 24 hour  Intake 121 ml  Output 250 ml  Net -129 ml   Last 3 Weights 10/28/2021 10/27/2021 10/13/2021  Weight (lbs) 149 lb 7.6 oz 152 lb 1.9 oz 166 lb 9.6 oz  Weight (kg) 67.8 kg 69 kg 75.569 kg      Telemetry    Atrial fibrillation, rate controlled - Personally Reviewed  ECG    No new - Personally Reviewed  Physical Exam   GEN: Well nourished, well developed in no acute distress HEENT: Normal, moist mucous membranes NECK: No JVD CARDIAC: irregularly irregular rhythm, normal S1 and S2, no rubs or gallops. No murmur. VASCULAR: Radial  and DP pulses 2+ bilaterally. No carotid bruits RESPIRATORY:  Clear to auscultation without rales, wheezing or rhonchi  ABDOMEN: Soft, non-tender, non-distended MUSCULOSKELETAL:  Ambulates independently SKIN: Warm and dry, 1+ edema at ankles and otherwise trivial NEUROLOGIC:  Alert and oriented x 3. No focal neuro deficits noted. PSYCHIATRIC:  Normal affect    Labs    High Sensitivity Troponin:   Recent Labs  Lab 10/25/21 0830 10/25/21 1158  TROPONINIHS 10 9     Chemistry Recent Labs  Lab 10/25/21 0830 10/25/21 0905 10/26/21 0351 10/27/21 0113 10/28/21 0124  NA 132*   < > 138 135 137  K 4.2   < > 3.6 3.6 4.1  CL 98   < > 102 99 99  CO2 25  --  29 27 31   GLUCOSE 106*   < > 94 105* 104*  BUN 23   < > 21 26* 30*  CREATININE 1.29*   < > 1.32* 1.43* 1.53*  CALCIUM 8.2*  --  8.9 8.6* 9.1  MG 1.8  --   --   --   --   PROT 5.6*  --   --   --   --  ALBUMIN 3.4*  --   --   --   --   AST 30  --   --   --   --   ALT 22  --   --   --   --   ALKPHOS 34*  --   --   --   --   BILITOT 1.1  --   --   --   --   GFRNONAA 52*  --  51* 46* 43*  ANIONGAP 9  --  7 9 7    < > = values in this interval not displayed.    Lipids No results for input(s): CHOL, TRIG, HDL, LABVLDL, LDLCALC, CHOLHDL in the last 168 hours.  Hematology Recent Labs  Lab 10/25/21 0830 10/25/21 0905 10/27/21 0113 10/28/21 0124  WBC 7.7  --  7.5 7.9  RBC 3.87*  --  4.16* 4.53  HGB 12.3* 12.9* 12.9* 13.8  HCT 37.3* 38.0* 38.3* 42.0  MCV 96.4  --  92.1 92.7  MCH 31.8  --  31.0 30.5  MCHC 33.0  --  33.7 32.9  RDW 13.4  --  13.2 13.0  PLT 144*  --  155 174   Thyroid  Recent Labs  Lab 10/25/21 1830  TSH 2.517    BNP Recent Labs  Lab 10/25/21 0830  BNP 623.5*    DDimer No results for input(s): DDIMER in the last 168 hours.   Radiology    ECHOCARDIOGRAM COMPLETE  Result Date: 10/26/2021    ECHOCARDIOGRAM REPORT   Patient Name:   AMBERS IYENGAR Date of Exam: 10/26/2021 Medical Rec #:  [id number]    Height:       68.0 in Accession #:    [id number]  Weight:       166.6 lb Date of Birth:  08-16-1928    BSA:          1.891 m Patient Age:    87 years    BP:           137/86 mmHg Patient Gender: M           HR:           97 bpm. Exam Location:  Inpatient Procedure: 2D Echo, Cardiac Doppler and Color Doppler Indications:    Dyspnea  History:        Patient has prior history of Echocardiogram examinations, most                 recent 01/21/2018. Arrythmias:Atrial Fibrillation; Risk                 Factors:Hypertension and Dyslipidemia.  Sonographer:    Bernadene Person RDCS Referring Phys: [place]  1. Left ventricular ejection fraction, by estimation, is 50 to 55%. The left ventricle has normal function. The left ventricle has no regional wall motion abnormalities. There is mild left ventricular hypertrophy. Left ventricular diastolic parameters are indeterminate.  2. Right ventricular systolic function is normal. The right ventricular size is normal. There is moderately elevated pulmonary artery systolic pressure.  3. Left atrial size was moderately dilated.  4. Posterior leaflet appears proplapsed. MR is moderate to severe, the jet is eccentric and may underestiamte the severity.  5. Aortic valve is moderately calcified. The aortic valve is normal in structure. Aortic valve regurgitation is moderate. No aortic stenosis is present.  6. The inferior vena cava is dilated in size with >50% respiratory variability, suggesting right atrial pressure of 8 mmHg. Comparison(s):  No prior Echocardiogram. Conclusion(s)/Recommendation(s): Patient has moderate to severe mitral regurgitation; consider TEE if clinically indicated. FINDINGS  Left Ventricle: Left ventricular ejection fraction, by estimation, is 50 to 55%. The left ventricle has normal function. The left ventricle has no regional wall motion abnormalities. The left ventricular internal cavity size was normal in size. There is  mild left ventricular  hypertrophy. Left ventricular diastolic parameters are indeterminate. Right Ventricle: The right ventricular size is normal. No increase in right ventricular wall thickness. Right ventricular systolic function is normal. There is moderately elevated pulmonary artery systolic pressure. The tricuspid regurgitant velocity is 3.20 m/s, and with an assumed right atrial pressure of 8 mmHg, the estimated right ventricular systolic pressure is 67.2 mmHg. Left Atrium: Left atrial size was moderately dilated. Right Atrium: Right atrial size was normal in size. Pericardium: There is no evidence of pericardial effusion. Mitral Valve: Posterior leaflet appears proplapsed. MR is moderate to severe, the jet is eccentric and may underestiamte the severity. Tricuspid Valve: The tricuspid valve is normal in structure. Tricuspid valve regurgitation is mild. Aortic Valve: Aortic valve is moderately calcified. The aortic valve is normal in structure. Aortic valve regurgitation is moderate. Aortic regurgitation PHT measures 479 msec. No aortic stenosis is present. Aortic valve mean gradient measures 7.7 mmHg. Aortic valve peak gradient measures 12.9 mmHg. Aortic valve area, by VTI measures 1.67 cm. Pulmonic Valve: The pulmonic valve was normal in structure. Pulmonic valve regurgitation is mild. Aorta: The aortic root and ascending aorta are structurally normal, with no evidence of dilitation. Venous: The inferior vena cava is dilated in size with greater than 50% respiratory variability, suggesting right atrial pressure of 8 mmHg. IAS/Shunts: No atrial level shunt detected by color flow Doppler.  LEFT VENTRICLE PLAX 2D LVIDd:         5.30 cm LVIDs:         3.80 cm LV PW:         1.10 cm LV IVS:        1.10 cm LVOT diam:     2.10 cm LV SV:         57 LV SV Index:   30 LVOT Area:     3.46 cm  RIGHT VENTRICLE TAPSE (M-mode): 1.9 cm LEFT ATRIUM             Index        RIGHT ATRIUM           Index LA diam:        6.20 cm 3.28 cm/m   RA  Area:     20.30 cm LA Vol (A2C):   78.3 ml 41.41 ml/m  RA Volume:   49.60 ml  26.23 ml/m LA Vol (A4C):   83.9 ml 44.37 ml/m LA Biplane Vol: 84.4 ml 44.63 ml/m  AORTIC VALVE AV Area (Vmax):    1.76 cm AV Area (Vmean):   1.72 cm AV Area (VTI):     1.67 cm AV Vmax:           179.67 cm/s AV Vmean:          126.000 cm/s AV VTI:            0.344 m AV Peak Grad:      12.9 mmHg AV Mean Grad:      7.7 mmHg LVOT Vmax:         91.17 cm/s LVOT Vmean:        62.567 cm/s LVOT VTI:  0.166 m LVOT/AV VTI ratio: 0.48 AI PHT:            479 msec  AORTA Ao Root diam: 3.70 cm Ao Asc diam:  3.60 cm MR Peak grad:    123.4 mmHg   TRICUSPID VALVE MR Mean grad:    91.5 mmHg    TR Peak grad:   41.0 mmHg MR Vmax:         555.50 cm/s  TR Vmax:        320.00 cm/s MR Vmean:        462.0 cm/s MR PISA:         3.08 cm     SHUNTS MR PISA Eff ROA: 21 mm       Systemic VTI:  0.17 m MR PISA Radius:  0.70 cm      Systemic Diam: 2.10 cm Phineas Inches Electronically signed by Phineas Inches Signature Date/Time: 10/26/2021/3:04:48 PM    Final     Cardiac Studies   Echo 10/26/21 1. Left ventricular ejection fraction, by estimation, is 50 to 55%. The  left ventricle has normal function. The left ventricle has no regional  wall motion abnormalities. There is mild left ventricular hypertrophy.  Left ventricular diastolic parameters  are indeterminate.   2. Right ventricular systolic function is normal. The right ventricular  size is normal. There is moderately elevated pulmonary artery systolic  pressure.   3. Left atrial size was moderately dilated.   4. Posterior leaflet appears proplapsed. MR is moderate to severe, the  jet is eccentric and may underestiamte the severity.   5. Aortic valve is moderately calcified. The aortic valve is normal in  structure. Aortic valve regurgitation is moderate. No aortic stenosis is  present.   6. The inferior vena cava is dilated in size with >50% respiratory  variability, suggesting right  atrial pressure of 8 mmHg.   Comparison(s): No prior Echocardiogram.   Patient Profile     87 y.o. male with PMH atrial fibrillation, hypertension, hyperlipidemia who presented with shortness of breath and was found to be in acute heart failure.  Assessment & Plan    Acute heart failure, diastolic Moderate to severe mitral regurgitation -NYHA class 3 on admission, now NYHA class II -echo with low normal EF, moderate to severe MR -net negative ~6L, though intake not well charted. No admission weight, weight today 67.8 kg.  -Cr 1.53 today, K 4.1. Diuresed well on torsemide, will hold today's dose to allow for some equilibration, then resume oral torsemide tomorrow -check BMET prior to follow up  Atrial fibrillation -CHA2DS2/VAS Stroke Risk Points=4  -continue apixaban 5 mg BID -see prior discussions re: antiarrhythmics. After shared decision making, started low dose load (200 mg BID) of amiodarone 11/3 until he is seen for follow up. If still in afib, can then decrease dose to 200 mg daily and plan for outpatient cardioversion   Hyperlipidemia -continue simvastatin, reasonable for age -on fenofibrate as well  Hypertension -continue metoprolol, he is warm and well perfused -takes cardura at bedtime, reviewed risk of orthostasis  For questions or updates, please contact [facility] Please consult [email or web address] for contact info under        Signed, Buford Dresser, MD  10/28/2021, 8:48 AM

## 2021-10-28 NOTE — Addendum Note (Signed)
Addended by: Charlie Pitter on: 10/28/2021 09:45 AM   Modules accepted: Orders

## 2021-10-28 NOTE — Progress Notes (Addendum)
BMET per Dr. Harrell Gave Addendum: cancelled order per d/w care manager - home health will draw and she stated they do not need lab order in [place].

## 2021-10-28 NOTE — Telephone Encounter (Signed)
    Attention TOC pool,  This patient will need a TOC phone call after discharge. They are being discharged today. Follow-up appointment has already been arranged with: 11/07/21 with Dr. Harrell Gave at [facility] They are a patient of Buford Dresser (patient is switching from Dr. Claiborne Billings to Dr. Harrell Gave - Dr. Harrell Gave sent a message to Dr. Claiborne Billings and his nurse to ensure they agree).  I was not sure if [facility] had a TOC pool, so sending to [facility] team. Thank you!  Thank you! Charlie Pitter, PA-C

## 2021-10-28 NOTE — Discharge Summary (Addendum)
Discharge Summary    Patient ID: Mark Stark. MRN: [id number]; DOB: 06-01-1928  Admit date: 10/25/2021 Discharge date: 10/28/2021  PCP:  [name], DO   [place] Providers Cardiologist:  Buford Dresser, MD        Discharge Diagnoses    Principal Problem:   Acute diastolic CHF (congestive heart failure) [facility]) Active Problems:   Hyperlipidemia   Essential hypertension   Aortic insufficiency   Persistent atrial fibrillation ([place])   Mitral regurgitation   Acute kidney injury superimposed on CKD [facility])    Diagnostic Studies/Procedures    2D echo 10/26/21  1. Left ventricular ejection fraction, by estimation, is 50 to 55%. The  left ventricle has normal function. The left ventricle has no regional  wall motion abnormalities. There is mild left ventricular hypertrophy.  Left ventricular diastolic parameters  are indeterminate.   2. Right ventricular systolic function is normal. The right ventricular  size is normal. There is moderately elevated pulmonary artery systolic  pressure.   3. Left atrial size was moderately dilated.   4. Posterior leaflet appears proplapsed. MR is moderate to severe, the  jet is eccentric and may underestiamte the severity.   5. Aortic valve is moderately calcified. The aortic valve is normal in  structure. Aortic valve regurgitation is moderate. No aortic stenosis is  present.   6. The inferior vena cava is dilated in size with >50% respiratory  variability, suggesting right atrial pressure of 8 mmHg.   Comparison(s): No prior Echocardiogram.   Conclusion(s)/Recommendation(s): Patient has moderate to severe mitral  regurgitation; consider TEE if clinically indicated.  _____________   History of Present Illness     Mark Stark. is a 87 y.o. male with recent persistent atrial fib, HTN, HLD, probable CKD stage IIIa by labs, prostate CA, inferior mesenteric vein thrombosis, first degree AV block who presented to [facility] with shortness of breath and congestive heart failure.  He was previously diagnosed with afib in 2018 with DCCV 2019 to NSR, placed on Eliquis. He had recurrent afib in 07/2021 associated with SOB. He was cardioverted 08/26/21 but returned to atrial fibrillation and has since been followed by atrial fib clinic. There was consideration of Tikosyn or amiodarone and the patient wanted to contemplate further. His wife had a poor experience on amiodarone so he had some hesitancy.[name] His HCTZ was changed to Lasix. He presented to the hospital 10/25/21 with worsening SOB, LE edema, and orthopnea. CXR showed small bilateral effusions and mild interstitial edema and BNP was elevated. He was subsequently admitted for heart failure.  Hospital Course     1. Acute diastolic CHF with moderate-severe mitral regurgitation, moderate aortic regurgitation - 2D echo 10/26/21 showed EF 50-55%, mild LVH, moderately elevated PASP, moderate LAE, moderate-severe MR (jet eccentric and may underestimate severity), moderate aortic regurgitation, dilated IVC - placed on IV Lasix with good diuresis, then switched to torsemide 11/3 - not weighed on admission but weight 11/3 152.12 then today 149.47lb - creatinine bumped today to 1.53 - per Dr. Harrell Gave, will hold torsemide today then restart tomorrow (was on Lasix prior to admission) - lisinopril stopped due to rising Cr - continue outpatient f/u for valvular disease  2. Persistent atrial fibrillation - multiple discussions ensued regarding antiarrhythmic therapy - after shared decision making, started low dose load (200 mg BID) of amiodarone 11/3 until he is seen for follow up. If still in afib, can then decrease dose to 200 mg daily and plan  for outpatient cardioversion  - the patient also originally had AF clinic f/u on 11/14 but since he will be seeing Dr. Harrell Gave that day, we cancelled this appointment pending the results of that visit with her - continued on Eliquis 5mg   BID - his Cr is just over the 1.5 threshold today but Dr. Harrell Gave suspects this will equilibrate with our diuretic adjustment so recommends to continue present dose - we will plan on BMET next week to reassess, will need ongoing review of this at each visit since his age is >80  3. Hyperlipidemia - simvastatin dose decreased to 10mg  daily due to concomitant amiodarone interaction  4. Essential HTN - lisinopril stopped as above - continue metoprolol, he is warm and well perfused - takes cardura at bedtime, reviewed risk of orthostasis  5. AKI on CKD stage IIIa - historical Cr is around 1.1-1.2, admitted around 1.3 - slight rise in 1.53 today prompting changes above - f/u BMET planned 11/9 by home health (discussed with care manager who states no lab order needed in Epic, instructions written on home health order for them to call result to Dr. Judeth Cornfield office as well as fax a copy)  The patient would like to f/u with Dr. Harrell Gave so she initiated a change of provider message chain to Dr. Claiborne Billings. We have arranged TOC f/u with her on 11/07/21. Dr. Harrell Gave has seen and examined the patient today and feels he is stable for discharge. We'll also arrange home health per discussion with care management. Patient requested to use his regular pharmacy and politely declined [facility].   Did the patient have an acute coronary syndrome (MI, NSTEMI, STEMI, etc) this admission?:  No                               Did the patient have a percutaneous coronary intervention (stent / angioplasty)?:  No.        The patient will be scheduled for a TOC follow up appointment in 10 days.  A message has been sent to the [facility] and Scheduling Pool at the office where the patient should be seen for follow up.  _____________  Discharge Vitals Blood pressure (!) 122/96, pulse 75, temperature 97.6 F (36.4 C), temperature source Oral, resp. rate 17, height 5\' 8"  (1.727 m), weight 67.8 kg, SpO2 98 %.   Filed Weights   10/27/21 0554 10/28/21 0619  Weight: 69 kg 67.8 kg    Labs & Radiologic Studies    CBC Recent Labs    10/27/21 0113 10/28/21 0124  WBC 7.5 7.9  HGB 12.9* 13.8  HCT 38.3* 42.0  MCV 92.1 92.7  PLT 155 625   Basic Metabolic Panel Recent Labs    10/27/21 0113 10/28/21 0124  NA 135 137  K 3.6 4.1  CL 99 99  CO2 27 31  GLUCOSE 105* 104*  BUN 26* 30*  CREATININE 1.43* 1.53*  CALCIUM 8.6* 9.1   Liver Function Tests No results for input(s): AST, ALT, ALKPHOS, BILITOT, PROT, ALBUMIN in the last 72 hours. No results for input(s): LIPASE, AMYLASE in the last 72 hours. High Sensitivity Troponin:   Recent Labs  Lab 10/25/21 0830 10/25/21 1158  TROPONINIHS 10 9    BNP Invalid input(s): POCBNP D-Dimer No results for input(s): DDIMER in the last 72 hours. Hemoglobin A1C No results for input(s): HGBA1C in the last 72 hours. Fasting Lipid Panel No results for input(s): CHOL,  HDL, LDLCALC, TRIG, CHOLHDL, LDLDIRECT in the last 72 hours. Thyroid Function Tests Recent Labs    10/25/21 1830  TSH 2.517   _____________  DG Chest Port 1 View  Result Date: 10/25/2021 CLINICAL DATA:  Dyspnea. EXAM: PORTABLE CHEST 1 VIEW COMPARISON:  06/25/2015 FINDINGS: Stable cardiomediastinal contours. Small bilateral pleural effusions and mild interstitial edema identified. No airspace consolidation. Lung volumes appear low. Visualized osseous structures appear intact. IMPRESSION: 1. Mild CHF. 2. Low lung volumes. Electronically Signed   By: Kerby Moors M.D.   On: 10/25/2021 08:47   ECHOCARDIOGRAM COMPLETE  Result Date: 10/26/2021    ECHOCARDIOGRAM REPORT   Patient Name:   SUMNER KIRCHMAN Date of Exam: 10/26/2021 Medical Rec #:  [id number]   Height:       68.0 in Accession #:    [id number]  Weight:       166.6 lb Date of Birth:  1928/05/25    BSA:          1.891 m Patient Age:    87 years    BP:           137/86 mmHg Patient Gender: M           HR:           97 bpm. Exam Location:   Inpatient Procedure: 2D Echo, Cardiac Doppler and Color Doppler Indications:    Dyspnea  History:        Patient has prior history of Echocardiogram examinations, most                 recent 01/21/2018. Arrythmias:Atrial Fibrillation; Risk                 Factors:Hypertension and Dyslipidemia.  Sonographer:    Bernadene Person RDCS Referring Phys: [place]  1. Left ventricular ejection fraction, by estimation, is 50 to 55%. The left ventricle has normal function. The left ventricle has no regional wall motion abnormalities. There is mild left ventricular hypertrophy. Left ventricular diastolic parameters are indeterminate.  2. Right ventricular systolic function is normal. The right ventricular size is normal. There is moderately elevated pulmonary artery systolic pressure.  3. Left atrial size was moderately dilated.  4. Posterior leaflet appears proplapsed. MR is moderate to severe, the jet is eccentric and may underestiamte the severity.  5. Aortic valve is moderately calcified. The aortic valve is normal in structure. Aortic valve regurgitation is moderate. No aortic stenosis is present.  6. The inferior vena cava is dilated in size with >50% respiratory variability, suggesting right atrial pressure of 8 mmHg. Comparison(s): No prior Echocardiogram. Conclusion(s)/Recommendation(s): Patient has moderate to severe mitral regurgitation; consider TEE if clinically indicated. FINDINGS  Left Ventricle: Left ventricular ejection fraction, by estimation, is 50 to 55%. The left ventricle has normal function. The left ventricle has no regional wall motion abnormalities. The left ventricular internal cavity size was normal in size. There is  mild left ventricular hypertrophy. Left ventricular diastolic parameters are indeterminate. Right Ventricle: The right ventricular size is normal. No increase in right ventricular wall thickness. Right ventricular systolic function is normal. There is moderately  elevated pulmonary artery systolic pressure. The tricuspid regurgitant velocity is 3.20 m/s, and with an assumed right atrial pressure of 8 mmHg, the estimated right ventricular systolic pressure is 47.4 mmHg. Left Atrium: Left atrial size was moderately dilated. Right Atrium: Right atrial size was normal in size. Pericardium: There is no evidence of pericardial effusion. Mitral Valve: Posterior leaflet  appears proplapsed. MR is moderate to severe, the jet is eccentric and may underestiamte the severity. Tricuspid Valve: The tricuspid valve is normal in structure. Tricuspid valve regurgitation is mild. Aortic Valve: Aortic valve is moderately calcified. The aortic valve is normal in structure. Aortic valve regurgitation is moderate. Aortic regurgitation PHT measures 479 msec. No aortic stenosis is present. Aortic valve mean gradient measures 7.7 mmHg. Aortic valve peak gradient measures 12.9 mmHg. Aortic valve area, by VTI measures 1.67 cm. Pulmonic Valve: The pulmonic valve was normal in structure. Pulmonic valve regurgitation is mild. Aorta: The aortic root and ascending aorta are structurally normal, with no evidence of dilitation. Venous: The inferior vena cava is dilated in size with greater than 50% respiratory variability, suggesting right atrial pressure of 8 mmHg. IAS/Shunts: No atrial level shunt detected by color flow Doppler.  LEFT VENTRICLE PLAX 2D LVIDd:         5.30 cm LVIDs:         3.80 cm LV PW:         1.10 cm LV IVS:        1.10 cm LVOT diam:     2.10 cm LV SV:         57 LV SV Index:   30 LVOT Area:     3.46 cm  RIGHT VENTRICLE TAPSE (M-mode): 1.9 cm LEFT ATRIUM             Index        RIGHT ATRIUM           Index LA diam:        6.20 cm 3.28 cm/m   RA Area:     20.30 cm LA Vol (A2C):   78.3 ml 41.41 ml/m  RA Volume:   49.60 ml  26.23 ml/m LA Vol (A4C):   83.9 ml 44.37 ml/m LA Biplane Vol: 84.4 ml 44.63 ml/m  AORTIC VALVE AV Area (Vmax):    1.76 cm AV Area (Vmean):   1.72 cm AV Area  (VTI):     1.67 cm AV Vmax:           179.67 cm/s AV Vmean:          126.000 cm/s AV VTI:            0.344 m AV Peak Grad:      12.9 mmHg AV Mean Grad:      7.7 mmHg LVOT Vmax:         91.17 cm/s LVOT Vmean:        62.567 cm/s LVOT VTI:          0.166 m LVOT/AV VTI ratio: 0.48 AI PHT:            479 msec  AORTA Ao Root diam: 3.70 cm Ao Asc diam:  3.60 cm MR Peak grad:    123.4 mmHg   TRICUSPID VALVE MR Mean grad:    91.5 mmHg    TR Peak grad:   41.0 mmHg MR Vmax:         555.50 cm/s  TR Vmax:        320.00 cm/s MR Vmean:        462.0 cm/s MR PISA:         3.08 cm     SHUNTS MR PISA Eff ROA: 21 mm       Systemic VTI:  0.17 m MR PISA Radius:  0.70 cm      Systemic Diam: 2.10 cm [employer]  Electronically signed by Phineas Inches Signature Date/Time: 10/26/2021/3:04:48 PM    Final    Disposition   Pt is being discharged home today in good condition.  Follow-up Plans & Appointments     Follow-up [facility] Follow up.   Specialty: Cardiology Why: We have arranged for you to have a follow-up visit with Dr. Harrell Gave  Monday Nov 07, 2021 at 1:20 PM (Arrive by 1:05 PM). This is at the new [place] location off [address]. We have cancelled your Afib clinic visit that day - Dr. Harrell Gave will advise on when to reschedule this at your follow-up visit. Contact information: [address] [place] [address] [place] Follow up.   Why: The care manager is helping you get set up with home health. We have requested for them to draw bloodwork on Wednesday 40/9/81 (basic metabolic panel). You do not need to be fasting for this labwork.        [facility] Follow up.   Why: Home Health RN-agency will call to arrange visit Contact information: [phone]               Discharge Instructions     Diet - low sodium heart healthy   Complete by: As  directed    Discharge instructions   Complete by: As directed    Please take note of several medication changes as outlined:  - Your furosemide (Lasix) and lisinopril were stopped. - We sent in a new prescription for amiodarone, a medicine to help with your a-fib. - We sent in a new prescription for a lower dose of simvastatin, since this medication can interact with amiodarone. We are changing the dose of simvastatin from 20mg  to 10mg . - We sent in a new prescription for a fluid pill called torsemide and you will start that TOMORROW. - You may restart your potassium TOMORROW.   Increase activity slowly   Complete by: As directed        Discharge Medications   Allergies as of 10/28/2021       Reactions   Sulfonamide Derivatives Other (See Comments)   Unknown        Medication List     STOP taking these medications    furosemide 20 MG tablet Commonly known as: Lasix   lisinopril 20 MG tablet Commonly known as: ZESTRIL       TAKE these medications    amiodarone 200 MG tablet Commonly known as: PACERONE Take 1 tablet (200 mg total) by mouth 2 (two) times daily.   b complex vitamins tablet Take 1 tablet by mouth daily.   Calcium Carbonate-Vitamin D 600-400 MG-UNIT tablet Take 1 tablet by mouth daily.   clorazepate 15 MG tablet Commonly known as: TRANXENE TAKE 1 TABLET BY MOUTH EVERY DAY AT BEDTIME AS NEEDED What changed: See the new instructions.   cromolyn 5.2 MG/ACT nasal spray Commonly known as: NASALCROM Place 2 sprays into the nose 2 (two) times daily.   doxazosin 4 MG tablet Commonly known as: CARDURA Take 1 tablet (4 mg total) by mouth at bedtime.   Eliquis 5 MG Tabs tablet Generic drug: apixaban TAKE 1 TABLET BY MOUTH TWICE A DAY What changed: how much to take   fenofibrate 160 MG tablet TAKE 1 TABLET BY MOUTH EVERY DAY   fish oil-omega-3 fatty acids 1000 MG capsule Take 1 g by mouth  3 (three) times daily.   metoprolol 200 MG 24 hr  tablet Commonly known as: TOPROL-XL TAKE 1/2 TABLET BY MOUTH DAILY   MULTIVITAMIN PO Take 1 tablet by mouth daily.   potassium chloride 10 MEQ tablet Commonly known as: KLOR-CON Take 1 tablet (10 mEq total) by mouth daily. Notes to patient: Restart this TOMORROW 10/29/21.   PreserVision AREDS 2 Caps 2 po qd What changed:  how much to take how to take this when to take this additional instructions   simvastatin 10 MG tablet Commonly known as: ZOCOR Take 1 tablet (10 mg total) by mouth at bedtime. What changed:  medication strength See the new instructions.   torsemide 20 MG tablet Commonly known as: DEMADEX Take 1 tablet (20 mg total) by mouth daily. Start taking on: October 29, 2021   vitamin C 500 MG tablet Commonly known as: ASCORBIC ACID Take 500 mg by mouth daily.               Durable Medical Equipment  (From admission, onward)           Start     Ordered   10/28/21 0937  Heart failure home health orders  (Heart failure home health orders / Face to face)  Once       Comments: LABWORK: Please draw BMET 11/02/21 and call results to Dr. Shawna Orleans Christopher's office at [phone]. Please also fax copy to [phone].   Heart Failure Follow-up Care:  Verify follow-up appointments per Patient Discharge Instructions. Confirm transportation arranged. Reconcile home medications with discharge medication list. Remove discontinued medications from use. Assist patient/caregiver to manage medications using pill box. Reinforce low sodium food selection Assessments: Vital signs and oxygen saturation at each visit. Assess home environment for safety concerns, caregiver support and availability of low-sodium foods. Consult Education officer, museum, PT/OT, Dietitian, and CNA based on assessments. Perform comprehensive cardiopulmonary assessment. Notify MD for any change in condition or weight gain of 3 pounds in one day or 5 pounds in one week with symptoms. Daily Weights  and Symptom Monitoring: Ensure patient has access to scales. Teach patient/caregiver to weigh daily before breakfast and after voiding using same scale and record.    Teach patient/caregiver to track weight and symptoms and when to notify Provider. Activity: Develop individualized activity plan with patient/caregiver.  Question Answer Comment  Heart Failure Follow-up Care Or per Doctor (see comments)   Obtain the following labs Basic Metabolic Panel   Lab frequency Other see comments   Fax lab results to Other see comments   Diet Low Sodium Heart Healthy   Fluid restrictions: 2000 mL Fluid   Initiate Heart Failure Clinic Diuretic Protocol to be used by [place] only ( to be ordered by Heart Failure Team Providers Only) No      10/28/21 0938               Outstanding Labs/Studies   BMET 11/9  Duration of Discharge Encounter   Greater than 30 minutes including physician time.  Signed, Charlie Pitter, PA-C 10/28/2021, 9:43 AM

## 2021-10-28 NOTE — TOC Initial Note (Signed)
Transition of Care (TOC) - Initial/Assessment Note    Patient Details  Name: Mark Stark. MRN: [id number] Date of Birth: [birth year]  Transition of Care [facility]) CM/SW Contact:    Erenest Rasher, RN Phone Number: [phone] 10/28/2021, 9:47 AM  Clinical Narrative:                  HF TOC CM spoke to pt and offered choice for [facility]. Pt agreeable to [place] for [facility]. Pt states he lives at home alone. He has RW and cane at home. He drives to his appts and friends will assist as need. Contacted Ramond Marrow, [place] rep with new referral. Updated rep that pt will need BMET drawn and call to physician. Request on pt's [place] orders.     Expected Discharge Plan: [place] Barriers to Discharge: No Barriers Identified   Patient Goals and CMS Choice Patient states their goals for this hospitalization and ongoing recovery are:: wants to remain independent CMS Medicare.gov Compare Post Acute Care list provided to:: Patient Choice offered to / list presented to : Patient  Expected Discharge Plan and Services Expected Discharge Plan: [place] In-house Referral: Clinical Social Work Discharge Planning Services: CM Consult Post Acute Care Choice: [place] arrangements for the past 2 months: [place] Expected Discharge Date: 10/28/21                HH Arranged: RN [place] Agency: [place] ([facility]) Date HH Agency Contacted: 10/28/21 Time [place]: [phone] Representative spoke with at [place]: Elijio Miles  Prior Living Arrangements/Services Living arrangements for the past 2 months: [place] with:: Self Patient language and need for interpreter reviewed:: Yes Do you feel safe going back to the place where you live?: Yes      Need for Family Participation in Patient Care: No (Comment) Care giver support system in place?: No (comment) Current home services: DME (rolling  walker, cane) Criminal Activity/Legal Involvement Pertinent to Current Situation/Hospitalization: No - Comment as needed  Activities of Daily Living Home Assistive Devices/Equipment: None ADL Screening (condition at time of admission) Patient's cognitive ability adequate to safely complete daily activities?: Yes Is the patient deaf or have difficulty hearing?: Yes Does the patient have difficulty seeing, even when wearing glasses/contacts?: No Does the patient have difficulty concentrating, remembering, or making decisions?: No Patient able to express need for assistance with ADLs?: Yes Does the patient have difficulty dressing or bathing?: No Independently performs ADLs?: Yes (appropriate for developmental age) Does the patient have difficulty walking or climbing stairs?: Yes Weakness of Legs: Both Weakness of Arms/Hands: None  Permission Sought/Granted Permission sought to share information with : Family Supports, PCP Permission granted to share information with : Yes, Verbal Permission Granted  Share Information with NAME: Manuela Neptune  Permission granted to share info w AGENCY: [place] granted to share info w Relationship: friend  Permission granted to share info w Contact Information: [phone]  Emotional Assessment Appearance:: Appears stated age Attitude/Demeanor/Rapport: Gracious, Engaged Affect (typically observed): Accepting Orientation: : Oriented to Self, Oriented to Place, Oriented to  Time, Oriented to Situation   Psych Involvement: No (comment)  Admission diagnosis:  Exertional dyspnea [R06.09] Exercise intolerance [R68.89] Acute CHF (congestive heart failure) (HCC) [I50.9] Longstanding persistent atrial fibrillation (HCC) [I48.11] Patient Active Problem List   Diagnosis Date Noted   Mitral regurgitation 10/28/2021   Acute kidney injury  superimposed on CKD ([place]) 54/98/2641   Acute diastolic CHF (congestive heart failure) ([place]) 10/25/2021    Secondary hypercoagulable state ([place]) 08/23/2021   Prostate CA ([place]) 07/28/2021   Thrombocytopenia ([place]) 03/30/2021   Screening for HIV (human immunodeficiency virus) 03/30/2021   Monocytosis 03/30/2021   Abnormal platelets ([place]) 03/28/2021   Hip pain 03/28/2021   Persistent atrial fibrillation (HCC)    Paroxysmal atrial fibrillation ([place]) 01/12/2018   Cystoid macular edema of right eye 11/29/2017   Macular degeneration 12/21/2015   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 eye with active choroidal neovascularization ([place]) 04/30/2014   Myalgia and myositis 11/16/2013   Epiretinal membrane 11/07/2012   Posterior vitreous detachment 11/07/2012   Status post intraocular lens implant 11/07/2012   Fungal ear infection 04/09/2012   Abnormal laboratory test 03/27/2011   Aortic insufficiency 58/30/9407   SYSTOLIC MURMUR 68/07/8109   EDEMA 12/05/2010   ECCHYMOSES, SPONTANEOUS 11/28/2010   CONTUSION, RIGHT THIGH 11/28/2010   DECREASED HEARING, BILATERAL 09/23/2010   Pain in limb 07/28/2009   ANKLE SPRAIN, LEFT 07/28/2009   CONTUSION, LOWER LEG, LEFT 07/28/2009   DERMATITIS, CONTACT, NOS 10/02/2007   Essential hypertension 05/07/2007   PROSTATE CANCER, HX OF 03/13/2007   Hyperlipidemia 01/15/2007   Anxiety state 01/15/2007   DIVERTICULITIS, HX OF 01/15/2007   PCP:  [name], DO Pharmacy:   [id number] [place], [place] [address] Phone: [phone] Fax: [place] [phone] [place], [place] [address] [place] [address] [place] [address] Phone: [phone] Fax: [phone]     Social Determinants of Health ([place]) Interventions    Readmission Risk Interventions No flowsheet data found.

## 2021-10-28 NOTE — Plan of Care (Signed)

## 2021-10-30 DIAGNOSIS — Z7901 Long term (current) use of anticoagulants: Secondary | ICD-10-CM | POA: Diagnosis not present

## 2021-10-30 DIAGNOSIS — I5031 Acute diastolic (congestive) heart failure: Secondary | ICD-10-CM | POA: Diagnosis not present

## 2021-10-30 DIAGNOSIS — Z9181 History of falling: Secondary | ICD-10-CM | POA: Diagnosis not present

## 2021-10-30 DIAGNOSIS — C61 Malignant neoplasm of prostate: Secondary | ICD-10-CM | POA: Diagnosis not present

## 2021-10-30 DIAGNOSIS — I4819 Other persistent atrial fibrillation: Secondary | ICD-10-CM | POA: Diagnosis not present

## 2021-10-30 DIAGNOSIS — N1831 Chronic kidney disease, stage 3a: Secondary | ICD-10-CM | POA: Diagnosis not present

## 2021-10-30 DIAGNOSIS — I130 Hypertensive heart and chronic kidney disease with heart failure and stage 1 through stage 4 chronic kidney disease, or unspecified chronic kidney disease: Secondary | ICD-10-CM | POA: Diagnosis not present

## 2021-10-30 DIAGNOSIS — F419 Anxiety disorder, unspecified: Secondary | ICD-10-CM | POA: Diagnosis not present

## 2021-10-30 DIAGNOSIS — I440 Atrioventricular block, first degree: Secondary | ICD-10-CM | POA: Diagnosis not present

## 2021-10-30 DIAGNOSIS — E785 Hyperlipidemia, unspecified: Secondary | ICD-10-CM | POA: Diagnosis not present

## 2021-10-30 DIAGNOSIS — I080 Rheumatic disorders of both mitral and aortic valves: Secondary | ICD-10-CM | POA: Diagnosis not present

## 2021-10-30 DIAGNOSIS — K55059 Acute (reversible) ischemia of intestine, part and extent unspecified: Secondary | ICD-10-CM | POA: Diagnosis not present

## 2021-10-30 DIAGNOSIS — K5792 Diverticulitis of intestine, part unspecified, without perforation or abscess without bleeding: Secondary | ICD-10-CM | POA: Diagnosis not present

## 2021-10-31 DIAGNOSIS — N1831 Chronic kidney disease, stage 3a: Secondary | ICD-10-CM | POA: Diagnosis not present

## 2021-10-31 DIAGNOSIS — I130 Hypertensive heart and chronic kidney disease with heart failure and stage 1 through stage 4 chronic kidney disease, or unspecified chronic kidney disease: Secondary | ICD-10-CM | POA: Diagnosis not present

## 2021-10-31 DIAGNOSIS — I4819 Other persistent atrial fibrillation: Secondary | ICD-10-CM | POA: Diagnosis not present

## 2021-10-31 DIAGNOSIS — E785 Hyperlipidemia, unspecified: Secondary | ICD-10-CM | POA: Diagnosis not present

## 2021-10-31 DIAGNOSIS — I080 Rheumatic disorders of both mitral and aortic valves: Secondary | ICD-10-CM | POA: Diagnosis not present

## 2021-10-31 DIAGNOSIS — I5031 Acute diastolic (congestive) heart failure: Secondary | ICD-10-CM | POA: Diagnosis not present

## 2021-10-31 NOTE — Telephone Encounter (Signed)
Patient contacted regarding discharge from [place] on 10/28/21.  Patient understands to follow up with Dr.Christopher 11/14 at 1:20 pm at [facility] location. Patient understands discharge instructions. Patient understands medications and regiment. Patient understands to bring all medications to this visit.

## 2021-11-01 DIAGNOSIS — I130 Hypertensive heart and chronic kidney disease with heart failure and stage 1 through stage 4 chronic kidney disease, or unspecified chronic kidney disease: Secondary | ICD-10-CM | POA: Diagnosis not present

## 2021-11-01 DIAGNOSIS — I4819 Other persistent atrial fibrillation: Secondary | ICD-10-CM | POA: Diagnosis not present

## 2021-11-01 DIAGNOSIS — I5031 Acute diastolic (congestive) heart failure: Secondary | ICD-10-CM | POA: Diagnosis not present

## 2021-11-01 DIAGNOSIS — E785 Hyperlipidemia, unspecified: Secondary | ICD-10-CM | POA: Diagnosis not present

## 2021-11-01 DIAGNOSIS — N1831 Chronic kidney disease, stage 3a: Secondary | ICD-10-CM | POA: Diagnosis not present

## 2021-11-01 DIAGNOSIS — I080 Rheumatic disorders of both mitral and aortic valves: Secondary | ICD-10-CM | POA: Diagnosis not present

## 2021-11-02 ENCOUNTER — Ambulatory Visit (HOSPITAL_COMMUNITY): Payer: Medicare Other

## 2021-11-02 DIAGNOSIS — N1831 Chronic kidney disease, stage 3a: Secondary | ICD-10-CM | POA: Diagnosis not present

## 2021-11-02 DIAGNOSIS — I4819 Other persistent atrial fibrillation: Secondary | ICD-10-CM | POA: Diagnosis not present

## 2021-11-02 DIAGNOSIS — E785 Hyperlipidemia, unspecified: Secondary | ICD-10-CM | POA: Diagnosis not present

## 2021-11-02 DIAGNOSIS — I5031 Acute diastolic (congestive) heart failure: Secondary | ICD-10-CM | POA: Diagnosis not present

## 2021-11-02 DIAGNOSIS — I080 Rheumatic disorders of both mitral and aortic valves: Secondary | ICD-10-CM | POA: Diagnosis not present

## 2021-11-02 DIAGNOSIS — I130 Hypertensive heart and chronic kidney disease with heart failure and stage 1 through stage 4 chronic kidney disease, or unspecified chronic kidney disease: Secondary | ICD-10-CM | POA: Diagnosis not present

## 2021-11-03 DIAGNOSIS — H353211 Exudative age-related macular degeneration, right eye, with active choroidal neovascularization: Secondary | ICD-10-CM | POA: Diagnosis not present

## 2021-11-03 DIAGNOSIS — H35373 Puckering of macula, bilateral: Secondary | ICD-10-CM | POA: Diagnosis not present

## 2021-11-03 DIAGNOSIS — H353122 Nonexudative age-related macular degeneration, left eye, intermediate dry stage: Secondary | ICD-10-CM | POA: Diagnosis not present

## 2021-11-03 DIAGNOSIS — H43813 Vitreous degeneration, bilateral: Secondary | ICD-10-CM | POA: Diagnosis not present

## 2021-11-04 NOTE — Telephone Encounter (Signed)
Ok by me

## 2021-11-07 ENCOUNTER — Encounter (HOSPITAL_BASED_OUTPATIENT_CLINIC_OR_DEPARTMENT_OTHER): Payer: Self-pay | Admitting: Cardiology

## 2021-11-07 ENCOUNTER — Ambulatory Visit (INDEPENDENT_AMBULATORY_CARE_PROVIDER_SITE_OTHER): Payer: Medicare Other | Admitting: Cardiology

## 2021-11-07 ENCOUNTER — Other Ambulatory Visit: Payer: Self-pay

## 2021-11-07 ENCOUNTER — Ambulatory Visit (HOSPITAL_COMMUNITY): Payer: Medicare Other | Admitting: Physician Assistant

## 2021-11-07 VITALS — BP 125/60 | HR 66 | Ht 68.0 in | Wt 148.0 lb

## 2021-11-07 DIAGNOSIS — I340 Nonrheumatic mitral (valve) insufficiency: Secondary | ICD-10-CM

## 2021-11-07 DIAGNOSIS — I5032 Chronic diastolic (congestive) heart failure: Secondary | ICD-10-CM

## 2021-11-07 DIAGNOSIS — Z01818 Encounter for other preprocedural examination: Secondary | ICD-10-CM

## 2021-11-07 DIAGNOSIS — I4819 Other persistent atrial fibrillation: Secondary | ICD-10-CM | POA: Diagnosis not present

## 2021-11-07 DIAGNOSIS — D6869 Other thrombophilia: Secondary | ICD-10-CM | POA: Diagnosis not present

## 2021-11-07 DIAGNOSIS — Z01812 Encounter for preprocedural laboratory examination: Secondary | ICD-10-CM | POA: Diagnosis not present

## 2021-11-07 MED ORDER — AMIODARONE HCL 200 MG PO TABS: 200.0000 mg | ORAL_TABLET | Freq: Every day | ORAL | 3 refills | Status: DC

## 2021-11-07 NOTE — Patient Instructions (Signed)
Medication Instructions:  Decrease Amiodarone to 200 mg once a day  *If you need a refill on your cardiac medications before your next appointment, please call your pharmacy*   Lab Work: Your provider has recommended lab work today (BMP, CBC). Please have this collected at [facility]. The lab is open 8:00 am - 4:30 pm. Please avoid 12:00p - 1:00p for lunch hour. You do not need an appointment. Please go to [address] [place], [place] [address]. This is in the Primary Care office on the 3rd floor, let them know you are there for blood work and they will direct you to the lab.  If you have labs (blood work) drawn today and your tests are completely normal, you will receive your results only by: [place] (if you have MyChart) OR A paper copy in the mail If you have any lab test that is abnormal or we need to change your treatment, we will call you to review the results.   Testing/Procedures: [facility]   Follow-Up: At [facility], you and your health needs are our priority.  As part of our continuing mission to provide you with exceptional heart care, we have created designated Provider Care Teams.  These Care Teams include your primary Cardiologist (physician) and Advanced Practice Providers (APPs -  Physician Assistants and Nurse Practitioners) who all work together to provide you with the care you need, when you need it.  We recommend signing up for the patient portal called "MyChart".  Sign up information is provided on this After Visit Summary.  MyChart is used to connect with patients for Virtual Visits (Telemedicine).  Patients are able to view lab/test results, encounter notes, upcoming appointments, etc.  Non-urgent messages can be sent to your provider as well.   To learn more about what you can do with MyChart, go to [email or web address].    Your next appointment:   6 week(s)  The format for your next  appointment:   In Person  Provider:   Buford Dresser, MD     You are scheduled for a Cardioversion on December 06, 2021 with Dr.Christopher.  Please arrive at the [facility] (Main Entrance A) at [facility]: [address] [place], [place] [address] at 6:30 am.   DIET: Nothing to eat or drink after midnight except a sip of water with medications (see medication instructions below)  FYI: For your safety, and to allow [place] to monitor your vital signs accurately during the surgery/procedure we request that   if you have artificial nails, gel coating, SNS etc. Please have those removed prior to your surgery/procedure. Not having the nail coverings /polish removed may result in cancellation or delay of your surgery/procedure.   Medication Instructions:  Continue your anticoagulant: Eliquis You will need to continue your anticoagulant after your procedure until you  are told by your  Provider that it is safe to stop   Labs: Today (CBC, BMP)   You must have a responsible person to drive you home and stay in the waiting area during your procedure. Failure to do so could result in cancellation.  Bring your insurance cards.  *Special Note: Every effort is made to have your procedure done on time. Occasionally there are emergencies that occur at the hospital that may cause delays. Please be patient if a delay does occur.

## 2021-11-07 NOTE — H&P (View-Only) (Signed)
Cardiology Office Note:    Date:  11/09/2021   ID:  Mark Stark., DOB 01-May-1928, MRN [id number]  PCP:  [name], [name], DO  Cardiologist:  Buford Dresser, MD  Referring MD: [name], [name], *   CC: post hospital follow up  History of Present Illness:    Mark Stark. is a 87 y.o. male with a hx of atrial fibrillation, chronic diastolic CHF, CKD stage 3a, hypertension, dyslipidemia, aortic insufficiency, mitral regurgitation, inferior mesenteric vein thrombosis, prostate cancer, and anxiety, who is seen for post-hospital follow up.   On presentation to the hospital 10/25/2021 he complained of worsening shortness of breath, LE edema, and orthopnea. CXR revealed small bilateral effusions and mild interstitial edema. He was admitted for congestive heart failure.  Today:  Until yesterday, he was feeling exhausted most of the time, and was also feeling bilateral LE weakness. Today he is feeling better overall, and denies feeling weakness in his legs. Recently, he went out to eat, and is now wondering if a cause of his fatigue is eliminating too much sodium from his diet. However, his LE edema has improved significantly by limiting his sodium.  At home, he has been checking his blood pressure both before and after his walking exercises. Before exercising his blood pressure has ranged from 102/58 to 129/81. After exercising, his blood pressure has been ranging from 76/48 to 86/52. Today, his blood pressure was before 113/54, and after 105/71. Typically he will exercise before he eats and takes his medication.   Lately, his weight has been consistent at home, usually 142-146 lbs. He noticed that he was not as physically limited while walking from the parking lot to the office today.  For activity, he maintains a large garden at home. He cans and freezes most of his own vegetables.  For his diet, he often enjoys soups and tomato juice which he makes himself without adding  salt. He has also been eating [place] burgers often, and some lean pork loin.   He denies any palpitations, or chest pain. No lightheadedness, headaches, syncope, orthopnea, or PND.   Past Medical History:  Diagnosis Date   Anxiety    Aortic insufficiency    Atrial fibrillation (HCC)    Chronic diastolic CHF (congestive heart failure) (HCC)    Chronic kidney disease, stage 3a (HCC)    Diverticulitis    Dyslipidemia    Hypertension    Inferior mesenteric vein thrombosis (HCC)    Mitral regurgitation    Prostate cancer [facility])     Past Surgical History:  Procedure Laterality Date   CARDIOVERSION N/A 03/01/2018   Procedure: CARDIOVERSION;  Surgeon: Troy Sine, MD;  Location: [place];  Service: Cardiovascular;  Laterality: N/A;   CARDIOVERSION N/A 08/26/2021   Procedure: CARDIOVERSION;  Surgeon: Pixie Casino, MD;  Location: [place];  Service: Cardiovascular;  Laterality: N/A;   CATARACT EXTRACTION  01/27/2010   right   CATARACT EXTRACTION  03/04/2010   left   Prostate seed implants     PROSTATE SURGERY      Current Medications: Current Outpatient Medications on File Prior to Visit  Medication Sig   b complex vitamins tablet Take 1 tablet by mouth daily.   Calcium Carbonate-Vitamin D 600-400 MG-UNIT tablet Take 1 tablet by mouth daily.   clorazepate (TRANXENE) 15 MG tablet TAKE 1 TABLET BY MOUTH EVERY DAY AT BEDTIME AS NEEDED (Patient taking differently: Take 15 mg by mouth at bedtime as needed for  sleep or anxiety.)   cromolyn (NASALCROM) 5.2 MG/ACT nasal spray Place 2 sprays into the nose 2 (two) times daily.   doxazosin (CARDURA) 4 MG tablet Take 1 tablet (4 mg total) by mouth at bedtime.   ELIQUIS 5 MG TABS tablet TAKE 1 TABLET BY MOUTH TWICE A DAY (Patient taking differently: Take 5 mg by mouth 2 (two) times daily.)   fenofibrate 160 MG tablet TAKE 1 TABLET BY MOUTH EVERY DAY (Patient taking differently: Take 160 mg by mouth daily.)   fish oil-omega-3 fatty  acids 1000 MG capsule Take 1 g by mouth 3 (three) times daily.   metoprolol (TOPROL-XL) 200 MG 24 hr tablet TAKE 1/2 TABLET BY MOUTH DAILY (Patient taking differently: Take 100 mg by mouth daily.)   Multiple Vitamins-Minerals (MULTIVITAMIN PO) Take 1 tablet by mouth daily.   Multiple Vitamins-Minerals (PRESERVISION AREDS 2) CAPS 2 po qd (Patient taking differently: Take 1 tablet by mouth in the morning and at bedtime.)   potassium chloride (KLOR-CON) 10 MEQ tablet Take 1 tablet (10 mEq total) by mouth daily.   simvastatin (ZOCOR) 10 MG tablet Take 1 tablet (10 mg total) by mouth at bedtime.   vitamin C (ASCORBIC ACID) 500 MG tablet Take 500 mg by mouth daily.   No current facility-administered medications on file prior to visit.     Allergies:   Sulfonamide derivatives   Social History   Tobacco Use   Smoking status: Never   Smokeless tobacco: Never  Vaping Use   Vaping Use: Never used  Substance Use Topics   Alcohol use: No    Comment: Little wine occasionally    Drug use: No    Family History: family history includes Alzheimer's disease in his mother; Cancer (age of onset: 81) in his brother; Dementia in his mother; Mental illness in his mother.  ROS:   Please see the history of present illness. (+) Fatigue All other systems are reviewed and negative.    EKGs/Labs/Other Studies Reviewed:    The following studies were reviewed today:  Echo 10/26/2021:  1. Left ventricular ejection fraction, by estimation, is 50 to 55%. The  left ventricle has normal function. The left ventricle has no regional  wall motion abnormalities. There is mild left ventricular hypertrophy.  Left ventricular diastolic parameters  are indeterminate.   2. Right ventricular systolic function is normal. The right ventricular  size is normal. There is moderately elevated pulmonary artery systolic  pressure.   3. Left atrial size was moderately dilated.   4. Posterior leaflet appears proplapsed. MR  is moderate to severe, the  jet is eccentric and may underestiamte the severity.   5. Aortic valve is moderately calcified. The aortic valve is normal in  structure. Aortic valve regurgitation is moderate. No aortic stenosis is  present.   6. The inferior vena cava is dilated in size with >50% respiratory  variability, suggesting right atrial pressure of 8 mmHg.   Comparison(s): No prior Echocardiogram.   Conclusion(s)/Recommendation(s): Patient has moderate to severe mitral  regurgitation; consider TEE if clinically indicated.   Monitor 01/21/2018: Atrial fib Rare 2 - 3 second pauses.  EKG:  EKG is personally reviewed.   11/07/2021: atrial fibrillation at 66 bpm  Recent Labs: 10/25/2021: ALT 22; B Natriuretic Peptide 623.5; Magnesium 1.8; TSH 2.517 11/07/2021: BUN 41; Creatinine, Ser 2.06; Hemoglobin 14.8; Platelets 152; Potassium 5.0; Sodium 135   Recent Lipid Panel    Component Value Date/Time   CHOL 93 07/28/2021 1432   TRIG  50.0 07/28/2021 1432   HDL 49.00 07/28/2021 1432   CHOLHDL 2 07/28/2021 1432   VLDL 10.0 07/28/2021 1432   LDLCALC 34 07/28/2021 1432   LDLCALC 32 01/11/2018 1454    Physical Exam:    VS:  BP 125/60   Pulse 66   Ht 5\' 8"  (1.727 m)   Wt 148 lb (67.1 kg)   SpO2 92%   BMI 22.50 kg/m     Wt Readings from Last 3 Encounters:  11/07/21 148 lb (67.1 kg)  10/28/21 149 lb 7.6 oz (67.8 kg)  10/13/21 166 lb 9.6 oz (75.6 kg)    GEN: Well nourished, well developed in no acute distress HEENT: Normal, moist mucous membranes NECK: No JVD CARDIAC: irregularly irregular rhythm, normal S1 and S2, no rubs or gallops. 1/6 systolic murmur. VASCULAR: Radial and DP pulses 2+ bilaterally. No carotid bruits RESPIRATORY:  Clear to auscultation without rales, wheezing or rhonchi  ABDOMEN: Soft, non-tender, non-distended MUSCULOSKELETAL:  Ambulates independently SKIN: Warm and dry, no edema NEUROLOGIC:  Alert and oriented x 3. No focal neuro deficits  noted. PSYCHIATRIC:  Normal affect    ASSESSMENT:    1. Persistent atrial fibrillation ([place])   2. Pre-procedure lab exam   3. Chronic diastolic CHF (congestive heart failure) ([place])   4. Secondary hypercoagulable state ([place])   5. Nonrheumatic mitral valve regurgitation    PLAN:    Chronic diastolic heart failure with recent admission Moderate to severe mitral regurgitation when volume overloaded -NYHA class 3 on admission, now NYHA class II -echo with low normal EF, moderate to severe MR at peak congestion -murmur much quieter, suspect MR now improved -has continued to lose significant weight, appears euvolemic to dry -check BMET, CBC today. If renal function is worse, will decrease diuretics. He is nervous about reaccumulating fluid, discussed continuing home weights, watching for swelling/shortness of breath, etc   Atrial fibrillation, persistent -CHA2DS2/VAS Stroke Risk Points=4  -continue apixaban 5 mg BID. Meets age criteria, monitor renal function. Weight is >60 kg -he is tolerating amiodarone. Drop to 200 mg daily today -we discussed cardioversion today, he is amenable and wants me to be the one to perform the procedure -Shared Decision Making/Informed Consent{ The risks (stroke, cardiac arrhythmias rarely resulting in the need for a temporary or permanent pacemaker, skin irritation or burns and complications associated with conscious sedation including aspiration, arrhythmia, respiratory failure and death), benefits (restoration of normal sinus rhythm) and alternatives of a direct current cardioversion were explained in detail to Mr. Granzow and he agrees to proceed.     Hyperlipidemia -continue simvastatin, reasonable for age -on fenofibrate as well   Hypertension -continue metoprolol -we have discussed risk of orthostasis with doxazosin  Cardiac risk counseling and prevention recommendations: -recommend heart healthy/Mediterranean diet, with whole grains, fruits,  vegetable, fish, lean meats, nuts, and olive oil. Limit salt. -recommend moderate walking, 3-5 times/week for 30-50 minutes each session. Aim for at least 150 minutes.week. Goal should be pace of 3 miles/hours, or walking 1.5 miles in 30 minutes -recommend avoidance of tobacco products. Avoid excess alcohol. -ASCVD risk score: The ASCVD Risk score (Arnett DK, et al., 2019) failed to calculate for the following reasons:   The 2019 ASCVD risk score is only valid for ages 60 to 20    Plan for follow up: Scheduled cardioversion with me on 12/06/21 or sooner as needed.  Total time of encounter: 49 minutes total time of encounter, including 39 minutes spent in face-to-face patient care. This time includes  coordination of care and counseling regarding atrial fibrillation and diastolic heart failure. Remainder of non-face-to-face time involved reviewing chart documents/testing relevant to the patient encounter and documentation in the medical record, as well as arranging cardioversion  Buford Dresser, MD, PhD, [facility] [place]  [facility]    Buford Dresser, MD, PhD, [facility]    Medication Adjustments/Labs and Tests Ordered: Current medicines are reviewed at length with the patient today.  Concerns regarding medicines are outlined above.   Orders Placed This Encounter  Procedures   CBC   Basic metabolic panel   EKG [id number]    Meds ordered this encounter  Medications   amiodarone (PACERONE) 200 MG tablet    Sig: Take 1 tablet (200 mg total) by mouth daily.    Dispense:  90 tablet    Refill:  3    Patient Instructions  Medication Instructions:  Decrease Amiodarone to 200 mg once a day  *If you need a refill on your cardiac medications before your next appointment, please call your pharmacy*   Lab Work: Your provider has recommended lab work today (BMP, CBC). Please have this collected at [facility] [place]. The lab is open  8:00 am - 4:30 pm. Please avoid 12:00p - 1:00p for lunch hour. You do not need an appointment. Please go to [address] [place], [place] [address]. This is in the Primary Care office on the 3rd floor, let them know you are there for blood work and they will direct you to the lab.  If you have labs (blood work) drawn today and your tests are completely normal, you will receive your results only by: [place] (if you have MyChart) OR A paper copy in the mail If you have any lab test that is abnormal or we need to change your treatment, we will call you to review the results.   Testing/Procedures: [facility]   Follow-Up: At [facility], you and your health needs are our priority.  As part of our continuing mission to provide you with exceptional heart care, we have created designated Provider Care Teams.  These Care Teams include your primary Cardiologist (physician) and Advanced Practice Providers (APPs -  Physician Assistants and Nurse Practitioners) who all work together to provide you with the care you need, when you need it.  We recommend signing up for the patient portal called "MyChart".  Sign up information is provided on this After Visit Summary.  MyChart is used to connect with patients for Virtual Visits (Telemedicine).  Patients are able to view lab/test results, encounter notes, upcoming appointments, etc.  Non-urgent messages can be sent to your provider as well.   To learn more about what you can do with MyChart, go to [email or web address].    Your next appointment:   6 week(s)  The format for your next appointment:   In Person  Provider:   Buford Dresser, MD     You are scheduled for a Cardioversion on December 06, 2021 with Dr.Samar Dass.  Please arrive at the [facility] (Main Entrance A) at [facility]: [address] [place], [place] [address] at 6:30 am.   DIET: Nothing to eat or drink after midnight  except a sip of water with medications (see medication instructions below)  FYI: For your safety, and to allow [place] to monitor your vital signs accurately during the surgery/procedure we request that   if you have artificial nails, gel coating,  SNS etc. Please have those removed prior to your surgery/procedure. Not having the nail coverings /polish removed may result in cancellation or delay of your surgery/procedure.   Medication Instructions:  Continue your anticoagulant: Eliquis You will need to continue your anticoagulant after your procedure until you  are told by your  Provider that it is safe to stop   Labs: Today (CBC, BMP)   You must have a responsible person to drive you home and stay in the waiting area during your procedure. Failure to do so could result in cancellation.  Bring your insurance cards.  *Special Note: Every effort is made to have your procedure done on time. Occasionally there are emergencies that occur at the hospital that may cause delays. Please be patient if a delay does occur.     I,Mathew Stumpf,acting as a Education administrator for [employer], MD.,have documented all relevant documentation on the behalf of Buford Dresser, MD,as directed by  Buford Dresser, MD while in the presence of Buford Dresser, MD.  I, Buford Dresser, MD, have reviewed all documentation for this visit. The documentation on 11/09/21 for the exam, diagnosis, procedures, and orders are all accurate and complete.   Signed, Buford Dresser, MD PhD 11/09/2021 8:21 AM    [place]

## 2021-11-07 NOTE — Progress Notes (Signed)
Cardiology Office Note:    Date:  11/09/2021   ID:  Mark Riles., DOB 01-May-1928, MRN [id number]  PCP:  [name], [name], DO  Cardiologist:  Buford Dresser, MD  Referring MD: [name], [name], *   CC: post hospital follow up  History of Present Illness:    Mark Stark. is a 87 y.o. male with a hx of atrial fibrillation, chronic diastolic CHF, CKD stage 3a, hypertension, dyslipidemia, aortic insufficiency, mitral regurgitation, inferior mesenteric vein thrombosis, prostate cancer, and anxiety, who is seen for post-hospital follow up.   On presentation to the hospital 10/25/2021 he complained of worsening shortness of breath, LE edema, and orthopnea. CXR revealed small bilateral effusions and mild interstitial edema. He was admitted for congestive heart failure.  Today:  Until yesterday, he was feeling exhausted most of the time, and was also feeling bilateral LE weakness. Today he is feeling better overall, and denies feeling weakness in his legs. Recently, he went out to eat, and is now wondering if a cause of his fatigue is eliminating too much sodium from his diet. However, his LE edema has improved significantly by limiting his sodium.  At home, he has been checking his blood pressure both before and after his walking exercises. Before exercising his blood pressure has ranged from 102/58 to 129/81. After exercising, his blood pressure has been ranging from 76/48 to 86/52. Today, his blood pressure was before 113/54, and after 105/71. Typically he will exercise before he eats and takes his medication.   Lately, his weight has been consistent at home, usually 142-146 lbs. He noticed that he was not as physically limited while walking from the parking lot to the office today.  For activity, he maintains a large garden at home. He cans and freezes most of his own vegetables.  For his diet, he often enjoys soups and tomato juice which he makes himself without adding  salt. He has also been eating [place] burgers often, and some lean pork loin.   He denies any palpitations, or chest pain. No lightheadedness, headaches, syncope, orthopnea, or PND.   Past Medical History:  Diagnosis Date   Anxiety    Aortic insufficiency    Atrial fibrillation (HCC)    Chronic diastolic CHF (congestive heart failure) (HCC)    Chronic kidney disease, stage 3a (HCC)    Diverticulitis    Dyslipidemia    Hypertension    Inferior mesenteric vein thrombosis (HCC)    Mitral regurgitation    Prostate cancer [facility])     Past Surgical History:  Procedure Laterality Date   CARDIOVERSION N/A 03/01/2018   Procedure: CARDIOVERSION;  Surgeon: Troy Sine, MD;  Location: [place];  Service: Cardiovascular;  Laterality: N/A;   CARDIOVERSION N/A 08/26/2021   Procedure: CARDIOVERSION;  Surgeon: Pixie Casino, MD;  Location: [place];  Service: Cardiovascular;  Laterality: N/A;   CATARACT EXTRACTION  01/27/2010   right   CATARACT EXTRACTION  03/04/2010   left   Prostate seed implants     PROSTATE SURGERY      Current Medications: Current Outpatient Medications on File Prior to Visit  Medication Sig   b complex vitamins tablet Take 1 tablet by mouth daily.   Calcium Carbonate-Vitamin D 600-400 MG-UNIT tablet Take 1 tablet by mouth daily.   clorazepate (TRANXENE) 15 MG tablet TAKE 1 TABLET BY MOUTH EVERY DAY AT BEDTIME AS NEEDED (Patient taking differently: Take 15 mg by mouth at bedtime as needed for  sleep or anxiety.)   cromolyn (NASALCROM) 5.2 MG/ACT nasal spray Place 2 sprays into the nose 2 (two) times daily.   doxazosin (CARDURA) 4 MG tablet Take 1 tablet (4 mg total) by mouth at bedtime.   ELIQUIS 5 MG TABS tablet TAKE 1 TABLET BY MOUTH TWICE A DAY (Patient taking differently: Take 5 mg by mouth 2 (two) times daily.)   fenofibrate 160 MG tablet TAKE 1 TABLET BY MOUTH EVERY DAY (Patient taking differently: Take 160 mg by mouth daily.)   fish oil-omega-3 fatty  acids 1000 MG capsule Take 1 g by mouth 3 (three) times daily.   metoprolol (TOPROL-XL) 200 MG 24 hr tablet TAKE 1/2 TABLET BY MOUTH DAILY (Patient taking differently: Take 100 mg by mouth daily.)   Multiple Vitamins-Minerals (MULTIVITAMIN PO) Take 1 tablet by mouth daily.   Multiple Vitamins-Minerals (PRESERVISION AREDS 2) CAPS 2 po qd (Patient taking differently: Take 1 tablet by mouth in the morning and at bedtime.)   potassium chloride (KLOR-CON) 10 MEQ tablet Take 1 tablet (10 mEq total) by mouth daily.   simvastatin (ZOCOR) 10 MG tablet Take 1 tablet (10 mg total) by mouth at bedtime.   vitamin C (ASCORBIC ACID) 500 MG tablet Take 500 mg by mouth daily.   No current facility-administered medications on file prior to visit.     Allergies:   Sulfonamide derivatives   Social History   Tobacco Use   Smoking status: Never   Smokeless tobacco: Never  Vaping Use   Vaping Use: Never used  Substance Use Topics   Alcohol use: No    Comment: Little wine occasionally    Drug use: No    Family History: family history includes Alzheimer's disease in his mother; Cancer (age of onset: 81) in his brother; Dementia in his mother; Mental illness in his mother.  ROS:   Please see the history of present illness. (+) Fatigue All other systems are reviewed and negative.    EKGs/Labs/Other Studies Reviewed:    The following studies were reviewed today:  Echo 10/26/2021:  1. Left ventricular ejection fraction, by estimation, is 50 to 55%. The  left ventricle has normal function. The left ventricle has no regional  wall motion abnormalities. There is mild left ventricular hypertrophy.  Left ventricular diastolic parameters  are indeterminate.   2. Right ventricular systolic function is normal. The right ventricular  size is normal. There is moderately elevated pulmonary artery systolic  pressure.   3. Left atrial size was moderately dilated.   4. Posterior leaflet appears proplapsed. MR  is moderate to severe, the  jet is eccentric and may underestiamte the severity.   5. Aortic valve is moderately calcified. The aortic valve is normal in  structure. Aortic valve regurgitation is moderate. No aortic stenosis is  present.   6. The inferior vena cava is dilated in size with >50% respiratory  variability, suggesting right atrial pressure of 8 mmHg.   Comparison(s): No prior Echocardiogram.   Conclusion(s)/Recommendation(s): Patient has moderate to severe mitral  regurgitation; consider TEE if clinically indicated.   Monitor 01/21/2018: Atrial fib Rare 2 - 3 second pauses.  EKG:  EKG is personally reviewed.   11/07/2021: atrial fibrillation at 66 bpm  Recent Labs: 10/25/2021: ALT 22; B Natriuretic Peptide 623.5; Magnesium 1.8; TSH 2.517 11/07/2021: BUN 41; Creatinine, Ser 2.06; Hemoglobin 14.8; Platelets 152; Potassium 5.0; Sodium 135   Recent Lipid Panel    Component Value Date/Time   CHOL 93 07/28/2021 1432   TRIG  50.0 07/28/2021 1432   HDL 49.00 07/28/2021 1432   CHOLHDL 2 07/28/2021 1432   VLDL 10.0 07/28/2021 1432   LDLCALC 34 07/28/2021 1432   LDLCALC 32 01/11/2018 1454    Physical Exam:    VS:  BP 125/60   Pulse 66   Ht 5\' 8"  (1.727 m)   Wt 148 lb (67.1 kg)   SpO2 92%   BMI 22.50 kg/m     Wt Readings from Last 3 Encounters:  11/07/21 148 lb (67.1 kg)  10/28/21 149 lb 7.6 oz (67.8 kg)  10/13/21 166 lb 9.6 oz (75.6 kg)    GEN: Well nourished, well developed in no acute distress HEENT: Normal, moist mucous membranes NECK: No JVD CARDIAC: irregularly irregular rhythm, normal S1 and S2, no rubs or gallops. 1/6 systolic murmur. VASCULAR: Radial and DP pulses 2+ bilaterally. No carotid bruits RESPIRATORY:  Clear to auscultation without rales, wheezing or rhonchi  ABDOMEN: Soft, non-tender, non-distended MUSCULOSKELETAL:  Ambulates independently SKIN: Warm and dry, no edema NEUROLOGIC:  Alert and oriented x 3. No focal neuro deficits  noted. PSYCHIATRIC:  Normal affect    ASSESSMENT:    1. Persistent atrial fibrillation ([place])   2. Pre-procedure lab exam   3. Chronic diastolic CHF (congestive heart failure) ([place])   4. Secondary hypercoagulable state ([place])   5. Nonrheumatic mitral valve regurgitation    PLAN:    Chronic diastolic heart failure with recent admission Moderate to severe mitral regurgitation when volume overloaded -NYHA class 3 on admission, now NYHA class II -echo with low normal EF, moderate to severe MR at peak congestion -murmur much quieter, suspect MR now improved -has continued to lose significant weight, appears euvolemic to dry -check BMET, CBC today. If renal function is worse, will decrease diuretics. He is nervous about reaccumulating fluid, discussed continuing home weights, watching for swelling/shortness of breath, etc   Atrial fibrillation, persistent -CHA2DS2/VAS Stroke Risk Points=4  -continue apixaban 5 mg BID. Meets age criteria, monitor renal function. Weight is >60 kg -he is tolerating amiodarone. Drop to 200 mg daily today -we discussed cardioversion today, he is amenable and wants me to be the one to perform the procedure -Shared Decision Making/Informed Consent{ The risks (stroke, cardiac arrhythmias rarely resulting in the need for a temporary or permanent pacemaker, skin irritation or burns and complications associated with conscious sedation including aspiration, arrhythmia, respiratory failure and death), benefits (restoration of normal sinus rhythm) and alternatives of a direct current cardioversion were explained in detail to Mr. Granzow and he agrees to proceed.     Hyperlipidemia -continue simvastatin, reasonable for age -on fenofibrate as well   Hypertension -continue metoprolol -we have discussed risk of orthostasis with doxazosin  Cardiac risk counseling and prevention recommendations: -recommend heart healthy/Mediterranean diet, with whole grains, fruits,  vegetable, fish, lean meats, nuts, and olive oil. Limit salt. -recommend moderate walking, 3-5 times/week for 30-50 minutes each session. Aim for at least 150 minutes.week. Goal should be pace of 3 miles/hours, or walking 1.5 miles in 30 minutes -recommend avoidance of tobacco products. Avoid excess alcohol. -ASCVD risk score: The ASCVD Risk score (Arnett DK, et al., 2019) failed to calculate for the following reasons:   The 2019 ASCVD risk score is only valid for ages 60 to 20    Plan for follow up: Scheduled cardioversion with me on 12/06/21 or sooner as needed.  Total time of encounter: 49 minutes total time of encounter, including 39 minutes spent in face-to-face patient care. This time includes  coordination of care and counseling regarding atrial fibrillation and diastolic heart failure. Remainder of non-face-to-face time involved reviewing chart documents/testing relevant to the patient encounter and documentation in the medical record, as well as arranging cardioversion  Buford Dresser, MD, PhD, [facility] [place]  [facility]    Buford Dresser, MD, PhD, [facility]    Medication Adjustments/Labs and Tests Ordered: Current medicines are reviewed at length with the patient today.  Concerns regarding medicines are outlined above.   Orders Placed This Encounter  Procedures   CBC   Basic metabolic panel   EKG [id number]    Meds ordered this encounter  Medications   amiodarone (PACERONE) 200 MG tablet    Sig: Take 1 tablet (200 mg total) by mouth daily.    Dispense:  90 tablet    Refill:  3    Patient Instructions  Medication Instructions:  Decrease Amiodarone to 200 mg once a day  *If you need a refill on your cardiac medications before your next appointment, please call your pharmacy*   Lab Work: Your provider has recommended lab work today (BMP, CBC). Please have this collected at [facility] [place]. The lab is open  8:00 am - 4:30 pm. Please avoid 12:00p - 1:00p for lunch hour. You do not need an appointment. Please go to [address] [place], [place] [address]. This is in the Primary Care office on the 3rd floor, let them know you are there for blood work and they will direct you to the lab.  If you have labs (blood work) drawn today and your tests are completely normal, you will receive your results only by: [place] (if you have MyChart) OR A paper copy in the mail If you have any lab test that is abnormal or we need to change your treatment, we will call you to review the results.   Testing/Procedures: [facility]   Follow-Up: At [facility], you and your health needs are our priority.  As part of our continuing mission to provide you with exceptional heart care, we have created designated Provider Care Teams.  These Care Teams include your primary Cardiologist (physician) and Advanced Practice Providers (APPs -  Physician Assistants and Nurse Practitioners) who all work together to provide you with the care you need, when you need it.  We recommend signing up for the patient portal called "MyChart".  Sign up information is provided on this After Visit Summary.  MyChart is used to connect with patients for Virtual Visits (Telemedicine).  Patients are able to view lab/test results, encounter notes, upcoming appointments, etc.  Non-urgent messages can be sent to your provider as well.   To learn more about what you can do with MyChart, go to [email or web address].    Your next appointment:   6 week(s)  The format for your next appointment:   In Person  Provider:   Buford Dresser, MD     You are scheduled for a Cardioversion on December 06, 2021 with Dr.Timeka Goette.  Please arrive at the [facility] (Main Entrance A) at [facility]: [address] [place], [place] [address] at 6:30 am.   DIET: Nothing to eat or drink after midnight  except a sip of water with medications (see medication instructions below)  FYI: For your safety, and to allow [place] to monitor your vital signs accurately during the surgery/procedure we request that   if you have artificial nails, gel coating,  SNS etc. Please have those removed prior to your surgery/procedure. Not having the nail coverings /polish removed may result in cancellation or delay of your surgery/procedure.   Medication Instructions:  Continue your anticoagulant: Eliquis You will need to continue your anticoagulant after your procedure until you  are told by your  Provider that it is safe to stop   Labs: Today (CBC, BMP)   You must have a responsible person to drive you home and stay in the waiting area during your procedure. Failure to do so could result in cancellation.  Bring your insurance cards.  *Special Note: Every effort is made to have your procedure done on time. Occasionally there are emergencies that occur at the hospital that may cause delays. Please be patient if a delay does occur.     I,Mathew Stumpf,acting as a Education administrator for [employer], MD.,have documented all relevant documentation on the behalf of Buford Dresser, MD,as directed by  Buford Dresser, MD while in the presence of Buford Dresser, MD.  I, Buford Dresser, MD, have reviewed all documentation for this visit. The documentation on 11/09/21 for the exam, diagnosis, procedures, and orders are all accurate and complete.   Signed, Buford Dresser, MD PhD 11/09/2021 8:21 AM    [place]

## 2021-11-08 ENCOUNTER — Ambulatory Visit (INDEPENDENT_AMBULATORY_CARE_PROVIDER_SITE_OTHER): Payer: Medicare Other

## 2021-11-08 ENCOUNTER — Telehealth (HOSPITAL_BASED_OUTPATIENT_CLINIC_OR_DEPARTMENT_OTHER): Payer: Self-pay | Admitting: Cardiology

## 2021-11-08 DIAGNOSIS — I5031 Acute diastolic (congestive) heart failure: Secondary | ICD-10-CM | POA: Diagnosis not present

## 2021-11-08 DIAGNOSIS — I130 Hypertensive heart and chronic kidney disease with heart failure and stage 1 through stage 4 chronic kidney disease, or unspecified chronic kidney disease: Secondary | ICD-10-CM | POA: Diagnosis not present

## 2021-11-08 DIAGNOSIS — I080 Rheumatic disorders of both mitral and aortic valves: Secondary | ICD-10-CM | POA: Diagnosis not present

## 2021-11-08 DIAGNOSIS — I5032 Chronic diastolic (congestive) heart failure: Secondary | ICD-10-CM

## 2021-11-08 DIAGNOSIS — I4819 Other persistent atrial fibrillation: Secondary | ICD-10-CM | POA: Diagnosis not present

## 2021-11-08 DIAGNOSIS — E785 Hyperlipidemia, unspecified: Secondary | ICD-10-CM | POA: Diagnosis not present

## 2021-11-08 DIAGNOSIS — I10 Essential (primary) hypertension: Secondary | ICD-10-CM

## 2021-11-08 DIAGNOSIS — N1831 Chronic kidney disease, stage 3a: Secondary | ICD-10-CM | POA: Diagnosis not present

## 2021-11-08 LAB — BASIC METABOLIC PANEL
BUN/Creatinine Ratio: 20 (ref 10–24)
BUN: 41 mg/dL — ABNORMAL HIGH (ref 10–36)
CO2: 22 mmol/L (ref 20–29)
Calcium: 9.8 mg/dL (ref 8.6–10.2)
Chloride: 93 mmol/L — ABNORMAL LOW (ref 96–106)
Creatinine, Ser: 2.06 mg/dL — ABNORMAL HIGH (ref 0.76–1.27)
Glucose: 102 mg/dL — ABNORMAL HIGH (ref 70–99)
Potassium: 5 mmol/L (ref 3.5–5.2)
Sodium: 135 mmol/L (ref 134–144)
eGFR: 30 mL/min/{1.73_m2} — ABNORMAL LOW (ref >59)

## 2021-11-08 LAB — CBC
Hematocrit: 44.3 % (ref 37.5–51.0)
Hemoglobin: 14.8 g/dL (ref 13.0–17.7)
MCH: 30.9 pg (ref 26.6–33.0)
MCHC: 33.4 g/dL (ref 31.5–35.7)
MCV: 93 fL (ref 79–97)
Platelets: 152 10*3/uL (ref 150–450)
RBC: 4.79 x10E6/uL (ref 4.14–5.80)
RDW: 12.8 % (ref 11.6–15.4)
WBC: 7.8 10*3/uL (ref 3.4–10.8)

## 2021-11-08 NOTE — Addendum Note (Signed)
Addended by: Kathyrn Lass on: 11/08/2021 01:31 PM   Modules accepted: Orders

## 2021-11-08 NOTE — Patient Instructions (Signed)
Visit Information: Thank you for taking the time to speak with me today.  Patient Goals/Self-Care Activities: Patient will self administer medications as prescribed as evidenced by self report/primary caregiver report  Patient will attend all scheduled provider appointments as evidenced by clinician review of documented attendance to scheduled appointments and patient/caregiver report Patient will continue to perform ADL's independently as evidenced by patient/caregiver report Patient will continue to perform IADL's independently as evidenced by patient/caregiver report Patient will call provider office for new concerns or questions as evidenced by review of documented incoming telephone call notes and patient report Check blood pressure and heart rate(pulse) and record 2-3 times/week and notify provider if out of recommended range. use salt in moderation watch for swelling in feet, ankles and legs every day weigh myself daily Review heart failure zone tool and atrial fibrillation action plan located in Calendar/organizer. Plan to discuss at next telephone call. Call RN Care Manager if you have any questions.  The patient verbalized understanding of instructions, educational materials, and care plan provided today and agreed to receive a mailed copy of patient instructions, educational materials, and care plan.   Telephone follow up appointment with care management team member scheduled for: 12/09/21 The patient has been provided with contact information for the care management team and has been advised to call with any health related questions or concerns.   Thea Silversmith, RN, MSN, BSN, CCM Care Management Coordinator [facility] [phone]

## 2021-11-08 NOTE — Telephone Encounter (Signed)
Pt c/o BP issue: STAT if pt c/o blurred vision, one-sided weakness or slurred speech  1. What are your last 5 BP readings?  96/68 HR 69  2. Are you having any other symptoms (ex. Dizziness, headache, blurred vision, passed out)? fatigued  3. What is your BP issue? Home Health Nurse calling to report patient's BP and HR.   If the provider needs to change any medications, please contact the patient

## 2021-11-08 NOTE — Telephone Encounter (Signed)
Spoke to patient he wanted Dr.Christopher to know he felt more fatigued this morning.Stated B/P when he woke up 124/71 pulse 74.After walking 50 mins B/P 106/67 pulse 78.Stated he usually walks 30 to 40 mins but he increased to 50 mins today. He will go back to 30 mins.O2 sat 97 %.He saw Dr.Christopher yesterday and she decreased Amiodarone to 200 mg every afternoon.Stated she advised him to eat alittle more sodium and drink more water.He will try doing that today.Patient reassured.Advised to keep cardioversion appointment as scheduled.I will make Dr.Christopher aware.

## 2021-11-08 NOTE — Telephone Encounter (Signed)
Spoke to patient lab results and instructions given.Advised only take Torsemide 20 mg daily if gains 3 lbs  overnight or 5 lbs in 1 week.Patient will have repeat bmet 12/5.Lab order mailed.Advised to call back if he has any swelling.

## 2021-11-08 NOTE — Addendum Note (Signed)
Addended by: Kathyrn Lass on: 11/08/2021 01:17 PM   Modules accepted: Orders

## 2021-11-08 NOTE — Chronic Care Management (AMB) (Signed)
Chronic Care Management   CCM RN Visit Note  11/08/2021 Name: Mark Stark. MRN: [id number] DOB: 01/27/1928  Subjective: Mark Spong. is a 87 y.o. year old male who is a primary care patient of [name], DO. The care management team was consulted for assistance with disease management and care coordination needs.    Engaged with patient by telephone for follow up visit in response to provider referral for case management and/or care coordination services.   Consent to Services:  The patient was given information about Chronic Care Management services, agreed to services, and gave verbal consent prior to initiation of services.  Please see initial visit note for detailed documentation.   Patient agreed to services and verbal consent obtained.   Assessment: Review of patient past medical history, allergies, medications, health status, including review of consultants reports, laboratory and other test data, was performed as part of comprehensive evaluation and provision of chronic care management services.   SDOH (Social Determinants of Health) assessments and interventions performed:    CCM Care Plan  Allergies  Allergen Reactions   Sulfonamide Derivatives Other (See Comments)    Unknown    Outpatient Encounter Medications as of 11/08/2021  Medication Sig Note   amiodarone (PACERONE) 200 MG tablet Take 1 tablet (200 mg total) by mouth daily.    b complex vitamins tablet Take 1 tablet by mouth daily.    Calcium Carbonate-Vitamin D 600-400 MG-UNIT tablet Take 1 tablet by mouth daily.    clorazepate (TRANXENE) 15 MG tablet TAKE 1 TABLET BY MOUTH EVERY DAY AT BEDTIME AS NEEDED (Patient taking differently: Take 15 mg by mouth at bedtime as needed for sleep or anxiety.)    cromolyn (NASALCROM) 5.2 MG/ACT nasal spray Place 2 sprays into the nose 2 (two) times daily.    doxazosin (CARDURA) 4 MG tablet Take 1 tablet (4 mg total) by mouth at bedtime.    ELIQUIS 5 MG TABS  tablet TAKE 1 TABLET BY MOUTH TWICE A DAY (Patient taking differently: Take 5 mg by mouth 2 (two) times daily.)    fenofibrate 160 MG tablet TAKE 1 TABLET BY MOUTH EVERY DAY (Patient taking differently: Take 160 mg by mouth daily.)    fish oil-omega-3 fatty acids 1000 MG capsule Take 1 g by mouth 3 (three) times daily. 11/08/2021: Reports takes twice a day.   metoprolol (TOPROL-XL) 200 MG 24 hr tablet TAKE 1/2 TABLET BY MOUTH DAILY (Patient taking differently: Take 100 mg by mouth daily.)    Multiple Vitamins-Minerals (MULTIVITAMIN PO) Take 1 tablet by mouth daily.    Multiple Vitamins-Minerals (PRESERVISION AREDS 2) CAPS 2 po qd (Patient taking differently: Take 1 tablet by mouth in the morning and at bedtime.)    potassium chloride (KLOR-CON) 10 MEQ tablet Take 1 tablet (10 mEq total) by mouth daily.    simvastatin (ZOCOR) 10 MG tablet Take 1 tablet (10 mg total) by mouth at bedtime.    vitamin C (ASCORBIC ACID) 500 MG tablet Take 500 mg by mouth daily.    [DISCONTINUED] torsemide (DEMADEX) 20 MG tablet Take 1 tablet (20 mg total) by mouth daily.    No facility-administered encounter medications on file as of 11/08/2021.    Patient Active Problem List   Diagnosis Date Noted   Mitral regurgitation 10/28/2021   Acute kidney injury superimposed on CKD ([place]) 49/44/9675   Acute diastolic CHF (congestive heart failure) ([place]) 10/25/2021   Secondary hypercoagulable state ([place]) 08/23/2021   Prostate CA (  [place]) 07/28/2021   Thrombocytopenia ([place]) 03/30/2021   Screening for HIV (human immunodeficiency virus) 03/30/2021   Monocytosis 03/30/2021   Abnormal platelets ([place]) 03/28/2021   Hip pain 03/28/2021   Persistent atrial fibrillation ([facility])    Paroxysmal atrial fibrillation ([place]) 01/12/2018   Cystoid macular edema of right eye 11/29/2017   Macular degeneration 12/21/2015   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 eye with active choroidal neovascularization ([place]) 04/30/2014   Myalgia  and myositis 11/16/2013   Epiretinal membrane 11/07/2012   Posterior vitreous detachment 11/07/2012   Status post intraocular lens implant 11/07/2012   Fungal ear infection 04/09/2012   Abnormal laboratory test 03/27/2011   Aortic insufficiency 63/87/5643   SYSTOLIC MURMUR 32/95/1884   EDEMA 12/05/2010   ECCHYMOSES, SPONTANEOUS 11/28/2010   CONTUSION, RIGHT THIGH 11/28/2010   DECREASED HEARING, BILATERAL 09/23/2010   Pain in limb 07/28/2009   ANKLE SPRAIN, LEFT 07/28/2009   CONTUSION, LOWER LEG, LEFT 07/28/2009   DERMATITIS, CONTACT, NOS 10/02/2007   Essential hypertension 05/07/2007   PROSTATE CANCER, HX OF 03/13/2007   Hyperlipidemia 01/15/2007   Anxiety state 01/15/2007   DIVERTICULITIS, HX OF 01/15/2007    Conditions to be addressed/monitored:Atrial Fibrillation and CHF  Care Plan : Quality of Life  Updates made by Luretha Rued, RN since 11/08/2021 12:00 AM  Completed 11/08/2021   Problem: Quality of Life Resolved 11/08/2021  Priority: Medium     Long-Range Goal: Quality of Life Maintained Completed 11/08/2021  Start Date: 04/04/2021  Expected End Date: 10/05/2021  Recent Progress: On track  Priority: Medium  Note:   Resolving due to duplicate goal    Current Barriers:  Lack of Long Term care plan to maintain quality of life. Client reports "I feel fine". He continues to be active mowing the yard, exercises 6 days/week. Denies any difficulty getting to provider appointment or affording medications. Reports continues to attend opthalmology appointment for injections for macular degeneration and states it has stabilized and even improved some. He reports left eye better than right-good vision in left eye. Denies any health difficulty at this time. Nurse Case Manager Clinical Goal(s):  patient will verbalize understanding of plan for continued self management of health the patient will demonstrate ongoing self health care management ability as evidenced by attending  provider visits, taking medications as prescribed, continuing activities that encourage fulfullment in his life such as walking, gardening, outside activites. Interventions:  1:1 collaboration with [name], [name], DO regarding development and update of comprehensive plan of care as evidenced by provider attestation and co-signature Inter-disciplinary care team collaboration (see longitudinal plan of care) Evaluation of current treatment plan related to chronic disease processes and patient's adherence to plan as established by provider. Discussed meal planning Encouraged client to continue to be as active as possible per provider recommendation Reviewed upcoming provider appointments. Discussed plans with patient for ongoing care management follow up and provided patient with direct contact information for care management team Provided positive feedback regarding self management of health Patient Goals/Self-Care Activities Continue to take medications as prescribed Continue to attend scheduled provider appointments.  Continue to follow up with your provider office for new concerns or questions Continue to work with [facility] to address care coordination needs and/or clinical team to address health care and disease management related needs.   Follow Up Plan: Telephone follow up appointment with care management team member scheduled for: 11/08/21 The patient has been provided with contact information for the care management team and has been advised to call  with any health related questions or concerns.        Care Plan : Cardiovascular disease  Updates made by Luretha Rued, RN since 11/08/2021 12:00 AM  Completed 11/08/2021   Problem: Cardiovascular disease Resolved 11/08/2021  Priority: Medium     Long-Range Goal: Disease Progression Prevented or Minimized Completed 11/08/2021  Start Date: 04/05/2021  Expected End Date: 12/08/2021  Recent Progress: On track  Priority: Medium   Note:   Resolving due to duplicate goal    Objective:  Last practice recorded BP readings:  BP Readings from Last 3 Encounters:  07/28/21 128/84  04/01/21 104/72  03/31/21 (!) 151/58  Most recent lipid panel:     Component Value Date/Time   CHOL 93 07/28/2021 1432   TRIG 50.0 07/28/2021 1432   HDL 49.00 07/28/2021 1432   CHOLHDL 2 07/28/2021 1432   VLDL 10.0 07/28/2021 1432   LDLCALC 34 07/28/2021 1432   LDLCALC 32 01/11/2018 1454  Current Barriers:  Long term plan of care needed for management of cardiovascular disease in a patient with history of atrial fibrillation, hypertension, aortic valve insufficiency, hyperlipidemia. Client reports, "I feel fine". Continues to remain active, mowing lawn, he reports he exercises 6 days a week. Per client, home blood pressure readings: on 8/22 131/67 HR 56; 8/21 131/64 HR 55; 8/20 133/63 with HR 58 and 123/62 with HR 55.  Client reports primary concern is refill for Hydrochlorothiazide. Client reports previously ordered prn for ankle swelling. He reports his has been using every day and he feels this has helped with ankle swelling. Stated he called cardiologist office requesting a refill last week and that as of today his pharmacy has not received a refill.  Case Manager Clinical Goal(s):  patient will verbalize understanding of plan for management of cardiovascular disease patient will attend all scheduled medical appointments. patient will verbalize basic understanding of cardiovascular disease process and self health management plan as evidence by taking medications as prescribed, blood pressure maintained per provider recommendations. Interventions:  Collaboration with [name], [name], DO regarding development and update of comprehensive plan of care as evidenced by provider attestation and co-signature Inter-disciplinary care team collaboration (see longitudinal plan of care) Evaluation of current treatment plan related to  hypertension/cardiovascular disease self management and patient's adherence to plan as established by provider. Reviewed scheduled/upcoming provider appointments.  Medications reviewed with client.  Discussed with client that cardiologist may need to see client prior to refilling as he is using daily now versus as needed (previous prescription), and previous prescription has expired.  RNCM called Cardiologist office - updated that client has been using hydrochlorothiazide daily every day versus as needed (previous prescription); client called last week requesting a refill. RNCM request follow up on refill request. Discussed meal planning. Discussed ongoing care management follow up and provided client with direct contact information and encouraged client to  call anytime as needed. Provided positive feedback regarding self-management of health. Self-Care Activities: Self administers medications as prescribed Attends all scheduled provider appointments Calls provider office for new concerns, questions, or BP outside discussed parameters Checks BP and records as discussed Follows a Heart healthy/DASH diet Patient Goals: Continue to take medications as prescribed Continue to attend scheduled provider appointments Call provider office for new concerns, questions, or BP outside parameters Continue to follow a heart healthy/Dash diet; eat plenty of: vegetables, fruits, lean protein, fish, non-tropical vegetable oil. Limit red and processed meats, sodium and sugar-sweetened foods and drinks. Continue to monitor blood pressure as  recommended by provider and record. Continue exercise and/or maintain established activity routine. Follow Up Plan: Telephone follow up appointment with care management team member scheduled for: 11/08/21 The patient has been provided with contact information for the care management team and has been advised to call with any health related questions or concerns.      Care  Plan : RN Care Manager Plan of Care  Updates made by Luretha Rued, RN since 11/08/2021 12:00 AM     Problem: Chronic disease Managment Education and/or Care Corodination needs (CHF/Atrial Fibrillation)   Priority: High     Long-Range Goal: Development of Plan of Care for Chronic Disease Management (CHF/Atrial Fibrillation)   Start Date: 11/08/2021  Priority: High  Note:   Current Barriers: Mr. Cogbill with recent admission 10/25/21-10/28/21 with acute on chronic congestive heart failure. He reports he is doing better, denies any edema and is weighing and recording weights daily. Weight today was 142 lbs and reports he has lost about 20 pounds between hospitalization and today. Mr. Pitsenbarger also has a history of persistent atrial fibrillation and is scheduled for a cardioversion on 12/06/21. He reports he weighs daily, checks blood pressure, oxygen and pulse daily.  [facility]  Knowledge Deficits related to plan of care for management of Atrial Fibrillation and CHF  Chronic Disease Management support and education needs related to Atrial Fibrillation and CHF  RNCM Clinical Goal(s):  Patient will verbalize understanding of plan for management of Atrial Fibrillation and CHF as evidenced by taking medications as prescribed, attending provider visit as scheduled, notifying provider with signs/symptoms of exacerbation. verbalize basic understanding of Atrial Fibrillation and CHF disease process and self health management plan as evidenced by self report of managment take all medications exactly as prescribed and will call provider for medication related questions as evidenced by .    through collaboration with RN Care manager, provider, and care team.  Maintain and/or improved quality of life-the patient will demonstrate ongoing self health care management ability as evidenced by attending provider visits, taking medications as prescribed, continuing activities that  encourage fulfullment in his life such as walking, gardening, outside activites.  Interventions: 1:1 collaboration with primary care provider regarding development and update of comprehensive plan of care as evidenced by provider attestation and co-signature Inter-disciplinary care team collaboration (see longitudinal plan of care) Evaluation of current treatment plan related to  self management and patient's adherence to plan as established by provider Provided Calender/Organizer which includes action plan for CHF and Atrial fibrillation  Heart Failure Interventions: Long Term (on track) Basic overview and discussion of pathophysiology of Heart Failure reviewed; Provided education on low sodium diet; Reviewed Heart Failure Action Plan in depth and provided written copy; Assessed need for readable accurate scales in home; Discussed importance of daily weight and advised patient to weigh and record daily; Discussed the importance of keeping all appointments with provider; Advised patient to discuss   with provider; Patient encouraged to review Heart failure action plan.   AFIB Interventions: Long term (on track)   Reviewed importance of adherence to anticoagulant exactly as prescribed; Provided education booklet on atrial fibrillation   Patient encouraged to review Atrial fibrillation action plan. Discussed upcoming Cardioversion.  Patient Goals/Self-Care Activities: Patient will self administer medications as prescribed as evidenced by self report/primary caregiver report  Patient will attend all scheduled provider appointments as evidenced by clinician review of documented attendance to scheduled appointments and patient/caregiver report Patient will continue to perform  ADL's independently as evidenced by patient/caregiver report Patient will continue to perform IADL's independently as evidenced by patient/caregiver report Patient will call provider office for new concerns or questions  as evidenced by review of documented incoming telephone call notes and patient report Check blood pressure and heart rate(pulse) and record 2-3 times/week and notify provider if out of recommended range. use salt in moderation watch for swelling in feet, ankles and legs every day weigh myself daily Review heart failure zone tool and atrial fibrillation action plan located in Calendar/organizer. Plan to discuss at next telephone call. Call RN Care Manager if you have any questions.     Plan:Telephone follow up appointment with care management team member scheduled for:  next month The patient has been provided with contact information for the care management team and has been advised to call with any health related questions or concerns.    Thea Silversmith, RN, MSN, BSN, CCM Care Management Coordinator [facility] [phone]

## 2021-11-08 NOTE — Telephone Encounter (Signed)
Sent comment on his labs; he appears dehydrated. Gave recommendations on that note.

## 2021-11-09 ENCOUNTER — Telehealth: Payer: Self-pay | Admitting: Cardiology

## 2021-11-09 ENCOUNTER — Encounter (HOSPITAL_BASED_OUTPATIENT_CLINIC_OR_DEPARTMENT_OTHER): Payer: Self-pay | Admitting: Cardiology

## 2021-11-09 DIAGNOSIS — I4819 Other persistent atrial fibrillation: Secondary | ICD-10-CM | POA: Diagnosis not present

## 2021-11-09 DIAGNOSIS — I5031 Acute diastolic (congestive) heart failure: Secondary | ICD-10-CM | POA: Diagnosis not present

## 2021-11-09 DIAGNOSIS — I080 Rheumatic disorders of both mitral and aortic valves: Secondary | ICD-10-CM | POA: Diagnosis not present

## 2021-11-09 DIAGNOSIS — N1831 Chronic kidney disease, stage 3a: Secondary | ICD-10-CM | POA: Diagnosis not present

## 2021-11-09 DIAGNOSIS — I130 Hypertensive heart and chronic kidney disease with heart failure and stage 1 through stage 4 chronic kidney disease, or unspecified chronic kidney disease: Secondary | ICD-10-CM | POA: Diagnosis not present

## 2021-11-09 DIAGNOSIS — I10 Essential (primary) hypertension: Secondary | ICD-10-CM

## 2021-11-09 DIAGNOSIS — E785 Hyperlipidemia, unspecified: Secondary | ICD-10-CM | POA: Diagnosis not present

## 2021-11-09 DIAGNOSIS — I5032 Chronic diastolic (congestive) heart failure: Secondary | ICD-10-CM | POA: Insufficient documentation

## 2021-11-09 NOTE — Telephone Encounter (Signed)
Spoke with Romie Minus [facility] nurse) and relayed message from Dr. Harrell Gave per 11/15 note. She encouraged him to increase PO intake also. She reports 2 days in a row his BP had dropped significantly during the day  Patient told [facility] nurse he did not take evening meds last night b/c he felt worse yesterday than he did today 119/76 - AM (took amiodarone this morning, per Jean's report)  80/50 - 1PM  HH nurse said he looked good, alert, cooperative.   _____________________________ Hulen Skains patient also, per request of [facility] nurse  Patient typically takes amiodarone in the evening and doxazosin in the evening and metoprolol succinate in the morning.   BP 70/47 @ 3pm O2 98-99%  Patient has basically cut out all salt in diet. He is conscious of his food choices - had baked chicken and soup he made himself. He reports he felt better after eating soup than he has all morning.   He reports consistent weights (142-143lbs)   Advised will send message to Dr. Harrell Gave and Lars Mage RN to review and advise

## 2021-11-09 NOTE — Telephone Encounter (Signed)
  Advised him of [facility] NP advice. He recorded this and verbalized understanding of instructions. He will call back tomorrow with update.    Typically HR has been 73-77 HR 62-68 when he had low BP

## 2021-11-09 NOTE — Telephone Encounter (Signed)
Seen in clinic 11/07/21 with BP 125/60. He had BMP/CBC showing no anemia and renal function decline. Torsemide was changed to PRN. He was thought to be dry (dehydrated) on exam and recommended to increase fluid intake.   Does BP cuff give reading of heart rate? In setting of atrial fibrillation would be helpful to know to manage his medications.   Recommendations: Please ensure not taking Torsemide.  Hold Doxazosin.  Continue Amiodarone. Provide heart rate if possible This evening meal add some extra salt and fluid. Make position changes slowly. Continue daily weight. If SBP <100 in the morning, hold morning Metoprolol. Ensure only taking half tablet daily. Provide update of BP tomorrow afternoon.   If significant lightheadedness, dizziness, or syncope recommend evaluation in ED.  Loel Dubonnet, NP

## 2021-11-09 NOTE — Telephone Encounter (Signed)
Pt c/o BP issue: STAT if pt c/o blurred vision, one-sided weakness or slurred speech  1. What are your last 5 BP readings?  119/76 this morning 80/50 this afternoon   2. Are you having any other symptoms (ex. Dizziness, headache, blurred vision, passed out)? Tired and a couple of times where he needed to lay down.   3. What is your BP issue?  Hypotension that started yesterday. Didn't take evening BP meds due to this. Took amiodarone this morning and it has decreased throughout the day. Reports no other symptoms besides fatigue. Request callback to pt.

## 2021-11-10 NOTE — Telephone Encounter (Signed)
Spoke with pt, he reports he does feel better today than yesterday. He is not taking cardura or metoprolol. He has taken the torsemide. He added more sodium and water today. He is concerned about dehydration. He walked for 20 min twice today, he was doing 40 min of rapid walking. His weight this morning was 144 lb. 143 lb on Wednesday. 02 sat this morning 94% pulse 69. After 20 min of walking 02 sat 96% and pulse 63. Aware will forward to [place] walker np.

## 2021-11-10 NOTE — Telephone Encounter (Signed)
His Torsemide was transitioned to 20mg  PRN for weight gain of 3 lbs overnight or 5 lbs in one week on 11/08/21. He should only be taking as-needed, not daily. This will prevent dehydration as well as hypotension.   Recommend continuing to hold Doxazosin.   Recommend resuming Metoprolol Succinate at lower dose of 25mg  QHS to prevent tachycardia in setting of atrial fibrillation with reduced risk of hypotension with lower dose.   Continue Amiodarone 200mg  QD.  He should monitor BP daily and keep a log. Report BP consistently >130/80 or <110/60.   Loel Dubonnet, NP

## 2021-11-10 NOTE — Telephone Encounter (Signed)
  Pt is calling back, he said he needs to provide his updates to [place]

## 2021-11-11 DIAGNOSIS — I4819 Other persistent atrial fibrillation: Secondary | ICD-10-CM | POA: Diagnosis not present

## 2021-11-11 DIAGNOSIS — E785 Hyperlipidemia, unspecified: Secondary | ICD-10-CM | POA: Diagnosis not present

## 2021-11-11 DIAGNOSIS — I5031 Acute diastolic (congestive) heart failure: Secondary | ICD-10-CM | POA: Diagnosis not present

## 2021-11-11 DIAGNOSIS — I130 Hypertensive heart and chronic kidney disease with heart failure and stage 1 through stage 4 chronic kidney disease, or unspecified chronic kidney disease: Secondary | ICD-10-CM | POA: Diagnosis not present

## 2021-11-11 DIAGNOSIS — I080 Rheumatic disorders of both mitral and aortic valves: Secondary | ICD-10-CM | POA: Diagnosis not present

## 2021-11-11 DIAGNOSIS — N1831 Chronic kidney disease, stage 3a: Secondary | ICD-10-CM | POA: Diagnosis not present

## 2021-11-11 MED ORDER — METOPROLOL SUCCINATE ER 25 MG PO TB24: 25.0000 mg | ORAL_TABLET | Freq: Every day | ORAL | 3 refills | Status: DC

## 2021-11-11 NOTE — Telephone Encounter (Signed)
Pt updated with NP's recommendations and verbalized understanding. New orders placed.

## 2021-11-11 NOTE — Addendum Note (Signed)
Addended by: Meryl Crutch on: 11/11/2021 09:22 AM   Modules accepted: Orders

## 2021-11-14 ENCOUNTER — Telehealth: Payer: Self-pay | Admitting: Cardiology

## 2021-11-14 DIAGNOSIS — N1831 Chronic kidney disease, stage 3a: Secondary | ICD-10-CM | POA: Diagnosis not present

## 2021-11-14 DIAGNOSIS — I130 Hypertensive heart and chronic kidney disease with heart failure and stage 1 through stage 4 chronic kidney disease, or unspecified chronic kidney disease: Secondary | ICD-10-CM | POA: Diagnosis not present

## 2021-11-14 DIAGNOSIS — I5031 Acute diastolic (congestive) heart failure: Secondary | ICD-10-CM | POA: Diagnosis not present

## 2021-11-14 DIAGNOSIS — I080 Rheumatic disorders of both mitral and aortic valves: Secondary | ICD-10-CM | POA: Diagnosis not present

## 2021-11-14 DIAGNOSIS — I4819 Other persistent atrial fibrillation: Secondary | ICD-10-CM | POA: Diagnosis not present

## 2021-11-14 DIAGNOSIS — E785 Hyperlipidemia, unspecified: Secondary | ICD-10-CM | POA: Diagnosis not present

## 2021-11-14 NOTE — Telephone Encounter (Signed)
Spoke with pt, explained to patient the metoprolol is for his heart rate more than his blood pressure. He reports he did not understand that and was only taking it if his bp was above 130. Explained to patient he needs to take it daily at bedtime. Patient voiced understanding to call if his blood pressure gets low and he has any dizziness. Explained that if the nurse has concerns, she will need to give [place] a call. Will make dr Harrell Gave aware.

## 2021-11-14 NOTE — Telephone Encounter (Signed)
Pt c/o BP issue: STAT if pt c/o blurred vision, one-sided weakness or slurred speech  1. What are your last 5 BP readings? 110/54 HR 67 129/68 HR 97 131/62 HR 91 120/68 HR 94 112/71 HR 83  2. Are you having any other symptoms (ex. Dizziness, headache, blurred vision, passed out)? NO TO ALL  3. What is your BP issue? PT'S BP IS TOO LOW PER MARY (PT'S NURSE) [facility] [phone]  PT WAS TOLD TO TAKE METOPROLOL ONLY IF HIS BP IS OVER 130  MARY (NURSE) WAS TOLD PT WAS SUPPOSED TO BE TAKING METOPROLOL EVERYDAY

## 2021-11-15 ENCOUNTER — Telehealth: Payer: Self-pay | Admitting: Cardiology

## 2021-11-15 NOTE — Telephone Encounter (Signed)
Left message, ok per DPR being mailed

## 2021-11-15 NOTE — Telephone Encounter (Signed)
Pt c/o BP issue: STAT if pt c/o blurred vision, one-sided weakness or slurred speech  1. What are your last 5 BP readings?  117/62 114/60 91/58 90/59    2. Are you having any other symptoms (ex. Dizziness, headache, blurred vision, passed out)? Feeling very tired, no energy and sleepy.   3. What is your BP issue? Low BP States after he took his morning meds which were Amiodarone, potassium, eliquis, fenofibrate.  He thinks the amiodarone may have caused this.    Shortly after taking his morning meds his BP have been going done.

## 2021-11-15 NOTE — Telephone Encounter (Signed)
Patient would like a copy of his most recently medication list.

## 2021-11-15 NOTE — Telephone Encounter (Signed)
Agree with recommendations.  

## 2021-11-15 NOTE — Telephone Encounter (Signed)
Left message to call back  

## 2021-11-15 NOTE — Telephone Encounter (Signed)
Thanks. His blood pressures are actually great, not too low. Agree with metoprolol as a daily medications.

## 2021-11-15 NOTE — Telephone Encounter (Signed)
Spoke with the patient who states that his BP was low this morning. First two readings of 117/62 and 114/60 were when he first woke up. The patient then took his medications and went for a walk. Afterwards he felt very weak and exhausted. He denies any dizziness. He reports at that time blood pressures were 91/58 and 90/59. Patient reports that he is feeling better now. Recent readings this afternoon were 112/67 and 108/61. Denies any current symptoms. Patient reports he is staying hydrated. He reports drinking two glasses of water prior to his morning walks.  Patient states that he is taking Toprol XL 25 mg every evening. He has continued to take Cardura at night, advised patient that he was advised to hold Cardura. He has not been taking torsemide. He reports that his heart rates have remained in the 60s-70s. Advised patient to  continue to monitor his BP, increase fluids and sodium. Will make Dr. Harrell Gave aware. Patient verbalized understanding.

## 2021-11-16 DIAGNOSIS — E785 Hyperlipidemia, unspecified: Secondary | ICD-10-CM | POA: Diagnosis not present

## 2021-11-16 DIAGNOSIS — I4819 Other persistent atrial fibrillation: Secondary | ICD-10-CM | POA: Diagnosis not present

## 2021-11-16 DIAGNOSIS — N1831 Chronic kidney disease, stage 3a: Secondary | ICD-10-CM | POA: Diagnosis not present

## 2021-11-16 DIAGNOSIS — I130 Hypertensive heart and chronic kidney disease with heart failure and stage 1 through stage 4 chronic kidney disease, or unspecified chronic kidney disease: Secondary | ICD-10-CM | POA: Diagnosis not present

## 2021-11-16 DIAGNOSIS — I5031 Acute diastolic (congestive) heart failure: Secondary | ICD-10-CM | POA: Diagnosis not present

## 2021-11-16 DIAGNOSIS — I080 Rheumatic disorders of both mitral and aortic valves: Secondary | ICD-10-CM | POA: Diagnosis not present

## 2021-11-16 NOTE — Telephone Encounter (Signed)
Please see updated encounter  

## 2021-11-18 DIAGNOSIS — I080 Rheumatic disorders of both mitral and aortic valves: Secondary | ICD-10-CM | POA: Diagnosis not present

## 2021-11-18 DIAGNOSIS — I4819 Other persistent atrial fibrillation: Secondary | ICD-10-CM | POA: Diagnosis not present

## 2021-11-18 DIAGNOSIS — N1831 Chronic kidney disease, stage 3a: Secondary | ICD-10-CM | POA: Diagnosis not present

## 2021-11-18 DIAGNOSIS — E785 Hyperlipidemia, unspecified: Secondary | ICD-10-CM | POA: Diagnosis not present

## 2021-11-18 DIAGNOSIS — I130 Hypertensive heart and chronic kidney disease with heart failure and stage 1 through stage 4 chronic kidney disease, or unspecified chronic kidney disease: Secondary | ICD-10-CM | POA: Diagnosis not present

## 2021-11-18 DIAGNOSIS — I5031 Acute diastolic (congestive) heart failure: Secondary | ICD-10-CM | POA: Diagnosis not present

## 2021-11-21 DIAGNOSIS — I080 Rheumatic disorders of both mitral and aortic valves: Secondary | ICD-10-CM | POA: Diagnosis not present

## 2021-11-21 DIAGNOSIS — E785 Hyperlipidemia, unspecified: Secondary | ICD-10-CM | POA: Diagnosis not present

## 2021-11-21 DIAGNOSIS — I4819 Other persistent atrial fibrillation: Secondary | ICD-10-CM | POA: Diagnosis not present

## 2021-11-21 DIAGNOSIS — I5031 Acute diastolic (congestive) heart failure: Secondary | ICD-10-CM | POA: Diagnosis not present

## 2021-11-21 DIAGNOSIS — I130 Hypertensive heart and chronic kidney disease with heart failure and stage 1 through stage 4 chronic kidney disease, or unspecified chronic kidney disease: Secondary | ICD-10-CM | POA: Diagnosis not present

## 2021-11-21 DIAGNOSIS — N1831 Chronic kidney disease, stage 3a: Secondary | ICD-10-CM | POA: Diagnosis not present

## 2021-11-23 DIAGNOSIS — I4891 Unspecified atrial fibrillation: Secondary | ICD-10-CM | POA: Diagnosis not present

## 2021-11-23 DIAGNOSIS — I130 Hypertensive heart and chronic kidney disease with heart failure and stage 1 through stage 4 chronic kidney disease, or unspecified chronic kidney disease: Secondary | ICD-10-CM | POA: Diagnosis not present

## 2021-11-23 DIAGNOSIS — N1831 Chronic kidney disease, stage 3a: Secondary | ICD-10-CM | POA: Diagnosis not present

## 2021-11-23 DIAGNOSIS — I5031 Acute diastolic (congestive) heart failure: Secondary | ICD-10-CM | POA: Diagnosis not present

## 2021-11-23 DIAGNOSIS — I080 Rheumatic disorders of both mitral and aortic valves: Secondary | ICD-10-CM | POA: Diagnosis not present

## 2021-11-23 DIAGNOSIS — I4819 Other persistent atrial fibrillation: Secondary | ICD-10-CM | POA: Diagnosis not present

## 2021-11-23 DIAGNOSIS — E785 Hyperlipidemia, unspecified: Secondary | ICD-10-CM | POA: Diagnosis not present

## 2021-11-23 DIAGNOSIS — I509 Heart failure, unspecified: Secondary | ICD-10-CM | POA: Diagnosis not present

## 2021-11-24 ENCOUNTER — Encounter (HOSPITAL_COMMUNITY): Payer: Self-pay | Admitting: Cardiology

## 2021-11-24 DIAGNOSIS — R3911 Hesitancy of micturition: Secondary | ICD-10-CM | POA: Diagnosis not present

## 2021-11-24 DIAGNOSIS — Z8546 Personal history of malignant neoplasm of prostate: Secondary | ICD-10-CM | POA: Diagnosis not present

## 2021-11-24 DIAGNOSIS — N401 Enlarged prostate with lower urinary tract symptoms: Secondary | ICD-10-CM | POA: Diagnosis not present

## 2021-11-24 NOTE — Progress Notes (Signed)
Attempted to obtain medical history via telephone, unable to reach at this time. I left a voicemail to return pre surgical testing department's phone call.  

## 2021-11-25 DIAGNOSIS — I4819 Other persistent atrial fibrillation: Secondary | ICD-10-CM | POA: Diagnosis not present

## 2021-11-25 DIAGNOSIS — I130 Hypertensive heart and chronic kidney disease with heart failure and stage 1 through stage 4 chronic kidney disease, or unspecified chronic kidney disease: Secondary | ICD-10-CM | POA: Diagnosis not present

## 2021-11-25 DIAGNOSIS — E785 Hyperlipidemia, unspecified: Secondary | ICD-10-CM | POA: Diagnosis not present

## 2021-11-25 DIAGNOSIS — I5031 Acute diastolic (congestive) heart failure: Secondary | ICD-10-CM | POA: Diagnosis not present

## 2021-11-25 DIAGNOSIS — N1831 Chronic kidney disease, stage 3a: Secondary | ICD-10-CM | POA: Diagnosis not present

## 2021-11-25 DIAGNOSIS — I080 Rheumatic disorders of both mitral and aortic valves: Secondary | ICD-10-CM | POA: Diagnosis not present

## 2021-11-27 ENCOUNTER — Other Ambulatory Visit: Payer: Self-pay | Admitting: Family Medicine

## 2021-11-27 ENCOUNTER — Other Ambulatory Visit (HOSPITAL_COMMUNITY): Payer: Self-pay | Admitting: Physician Assistant

## 2021-11-27 ENCOUNTER — Other Ambulatory Visit: Payer: Self-pay | Admitting: Physician Assistant

## 2021-11-28 DIAGNOSIS — I10 Essential (primary) hypertension: Secondary | ICD-10-CM | POA: Diagnosis not present

## 2021-11-28 DIAGNOSIS — I5032 Chronic diastolic (congestive) heart failure: Secondary | ICD-10-CM | POA: Diagnosis not present

## 2021-11-28 LAB — BASIC METABOLIC PANEL
BUN/Creatinine Ratio: 17 (ref 10–24)
BUN: 24 mg/dL (ref 10–36)
CO2: 24 mmol/L (ref 20–29)
Calcium: 8.8 mg/dL (ref 8.6–10.2)
Chloride: 95 mmol/L — ABNORMAL LOW (ref 96–106)
Creatinine, Ser: 1.43 mg/dL — ABNORMAL HIGH (ref 0.76–1.27)
Glucose: 137 mg/dL — ABNORMAL HIGH (ref 70–99)
Potassium: 4.9 mmol/L (ref 3.5–5.2)
Sodium: 130 mmol/L — ABNORMAL LOW (ref 134–144)
eGFR: 46 mL/min/{1.73_m2} — ABNORMAL LOW (ref >59)

## 2021-11-28 IMAGING — DX DG LUMBAR SPINE COMPLETE 4+V
5 series · 5 of 5 positions shown · non-contrast
Comparison: 01/21/2013

CLINICAL DATA: Low back pain radiating into the right thigh

No known injury
EXAM:
LUMBAR SPINE - COMPLETE 4+ VIEW

[l-spine ap]
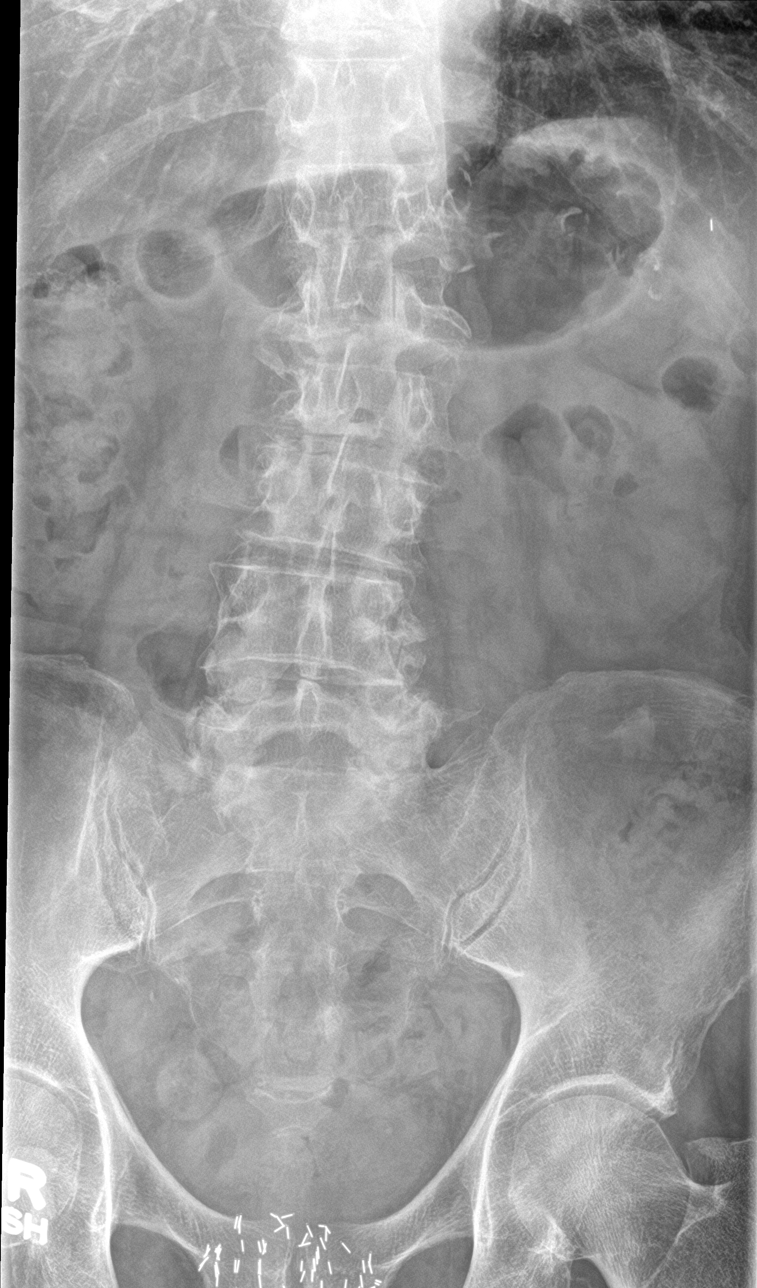

[l-spine obl (1 of 2)]
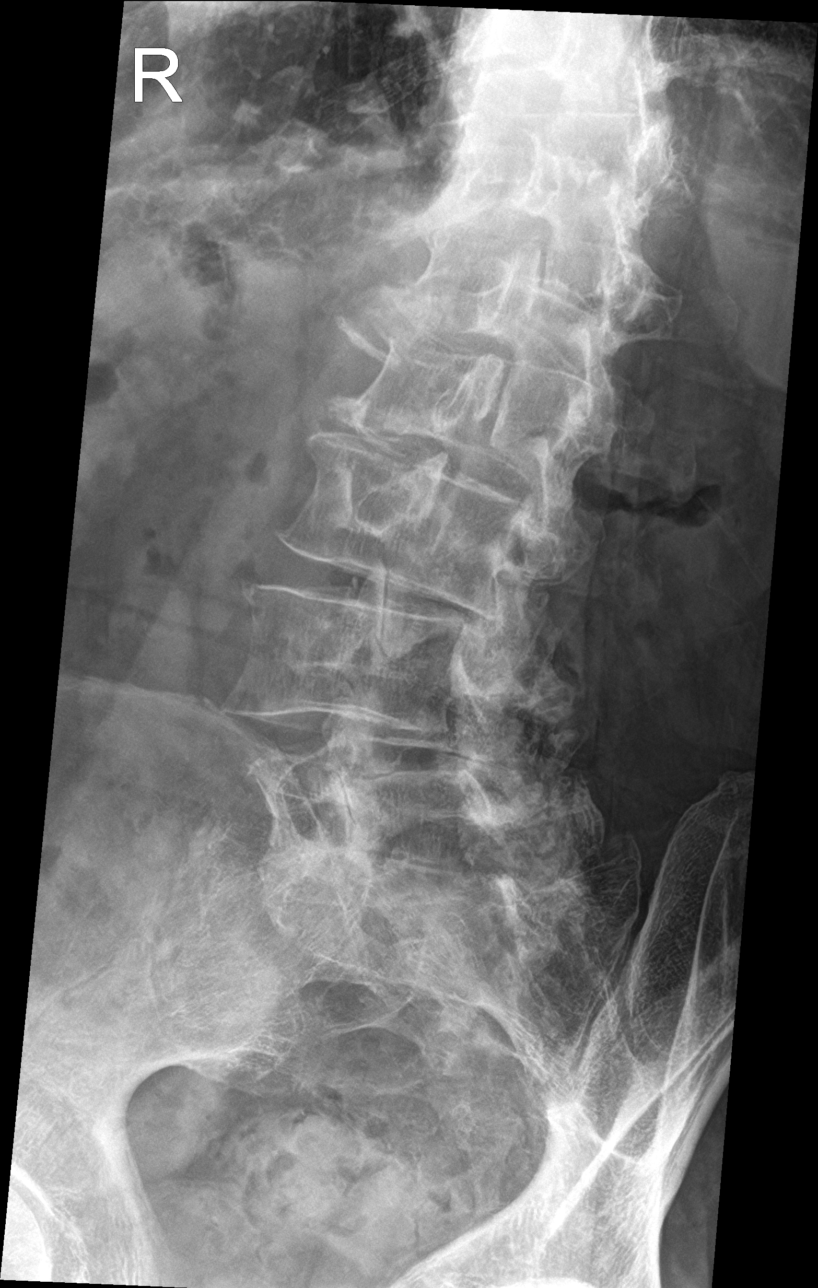

[l-spine obl (2 of 2)]
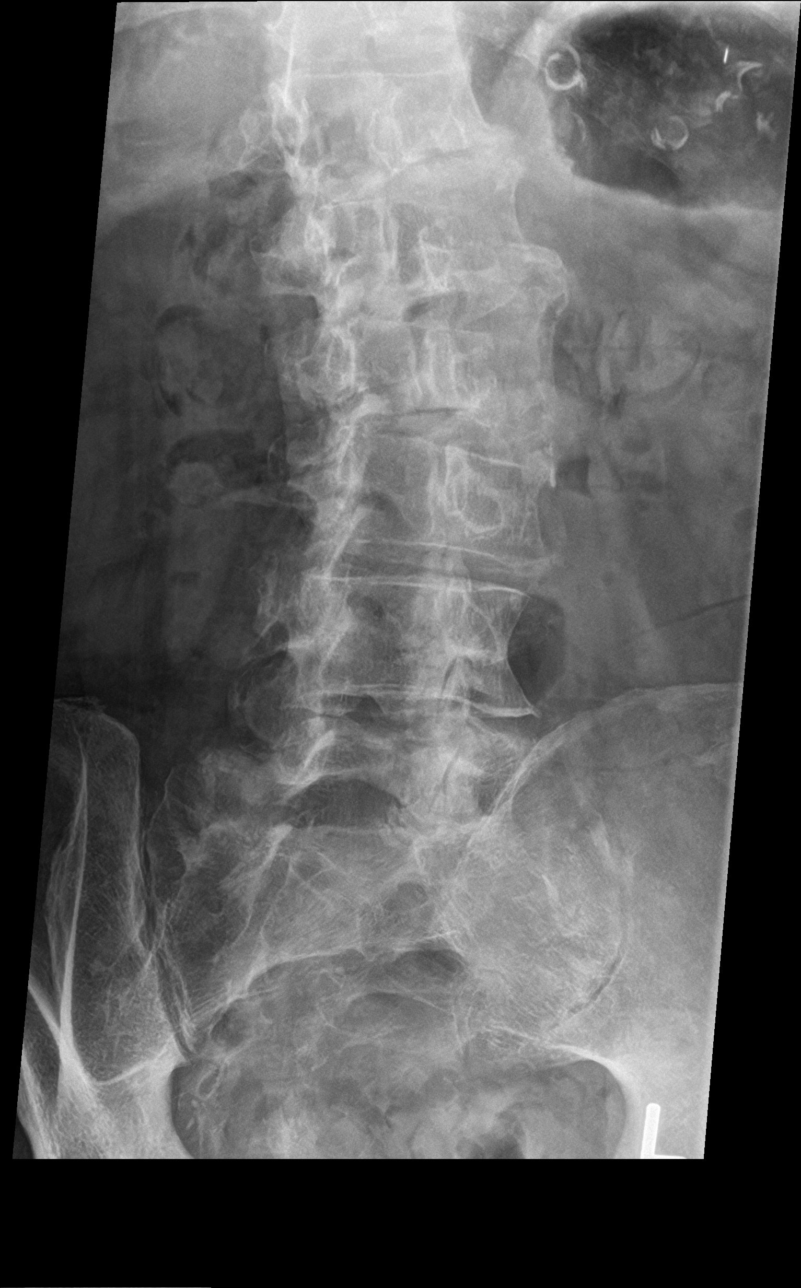

[l-spine lat]
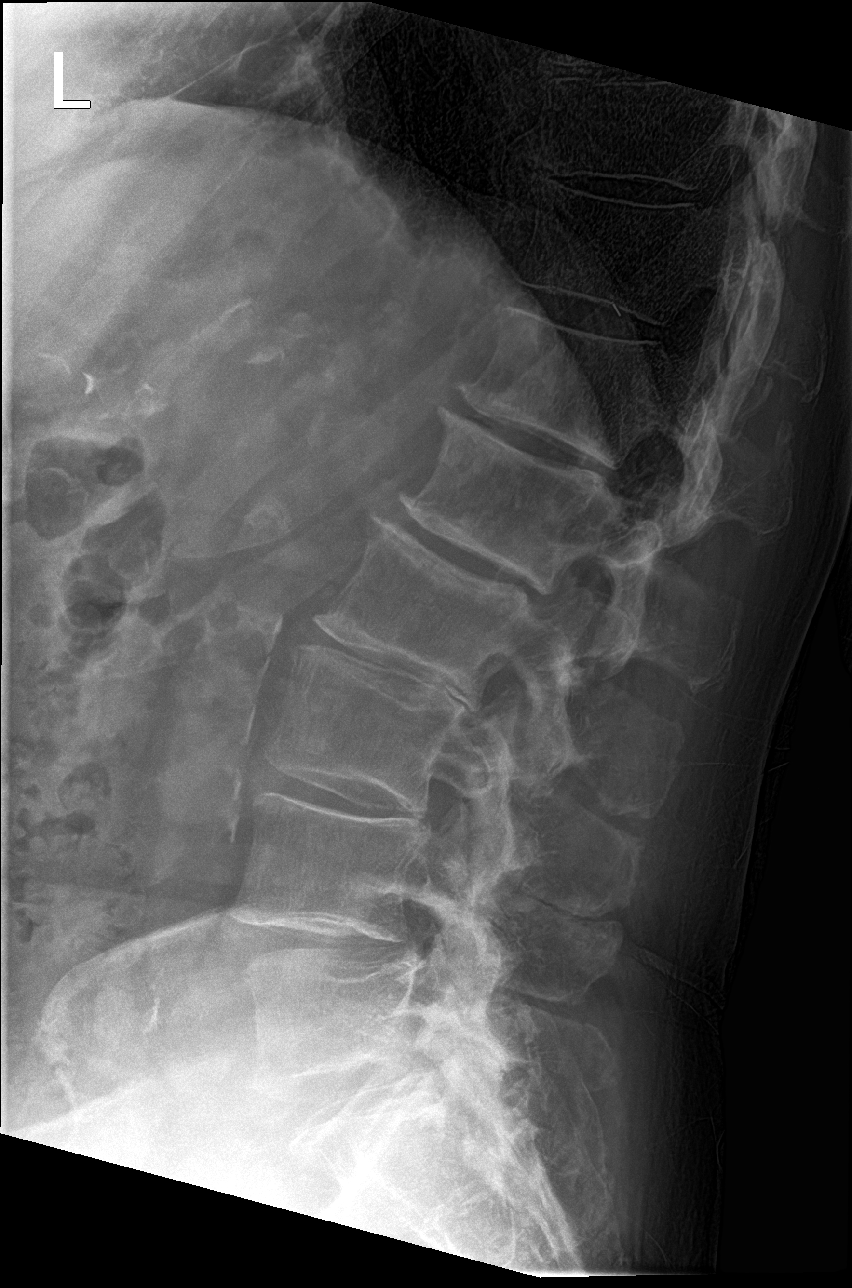

[l-spine spot]
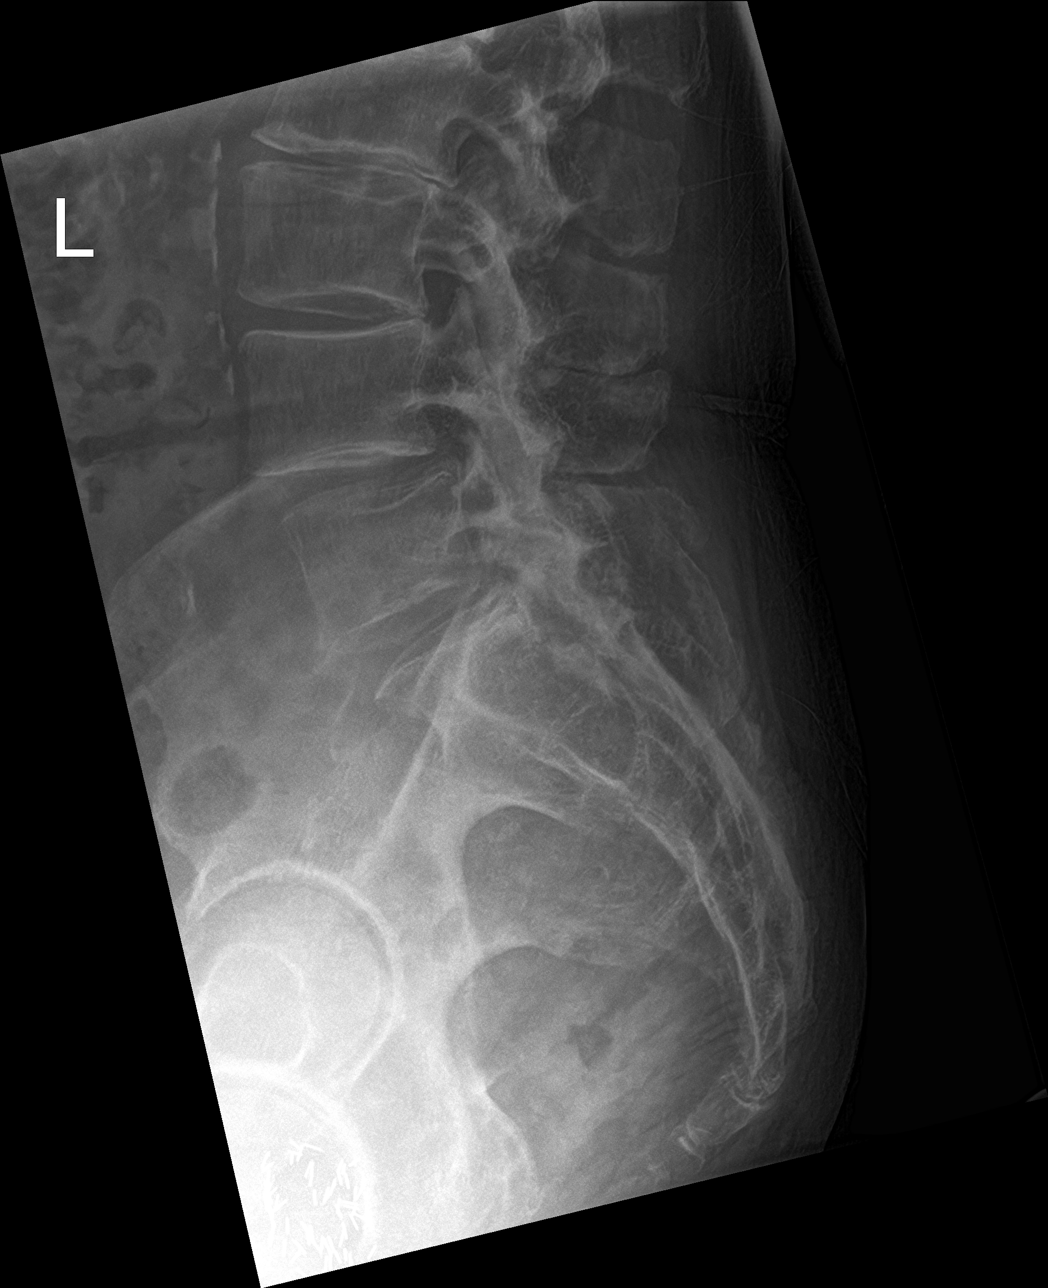

[5 of 5 positions shown; findings below may reference images not displayed]

FINDINGS: Levoconvex curvature of the lumbar spine. Minimal retrolisthesis of
L2 on L3 and L1-L2.

Vertebral body heights are maintained.

Mild disc height loss seen throughout the lumbar spine, greatest at
L2-L3. Mild to moderate facet degenerative changes seen throughout
the lumbar spine, greatest at L5-S1.

Atherosclerotic changes seen throughout visualized arterial
segments.
IMPRESSION: Multilevel degenerative changes of the lumbar spine as discussed
above.

## 2021-11-28 IMAGING — DX DG HIP (WITH OR WITHOUT PELVIS) 2-3V*R*
3 series · 3 of 3 positions shown · non-contrast
Comparison: None.

CLINICAL DATA: Right hip and low back pain for 1 week with no known
injury.

EXAM:
DG HIP (WITH OR WITHOUT PELVIS) 2-3V RIGHT

[pelvis ap]
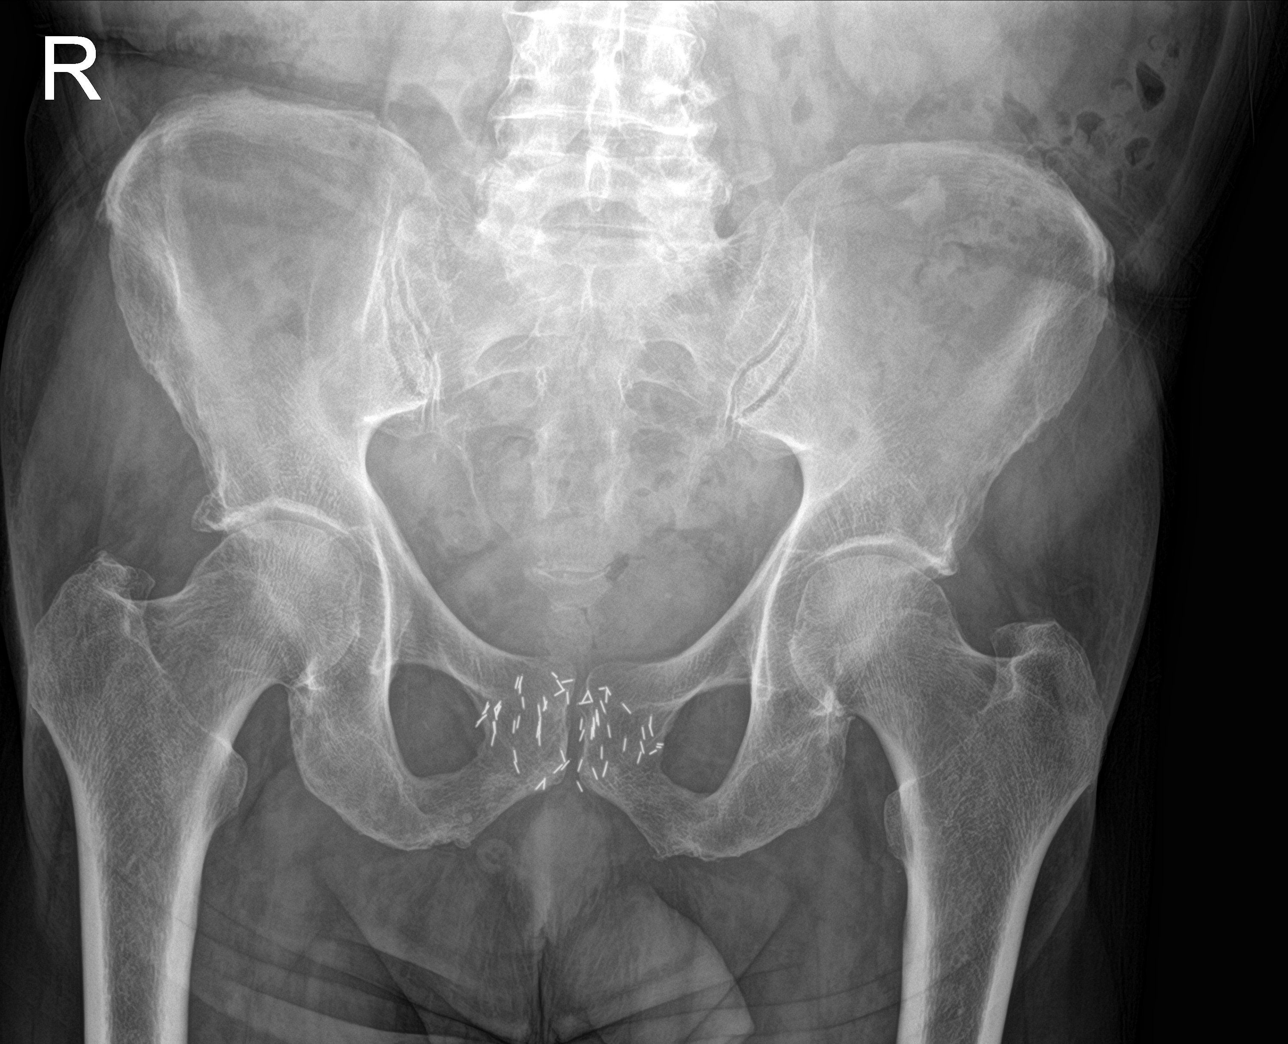

[hip ap]
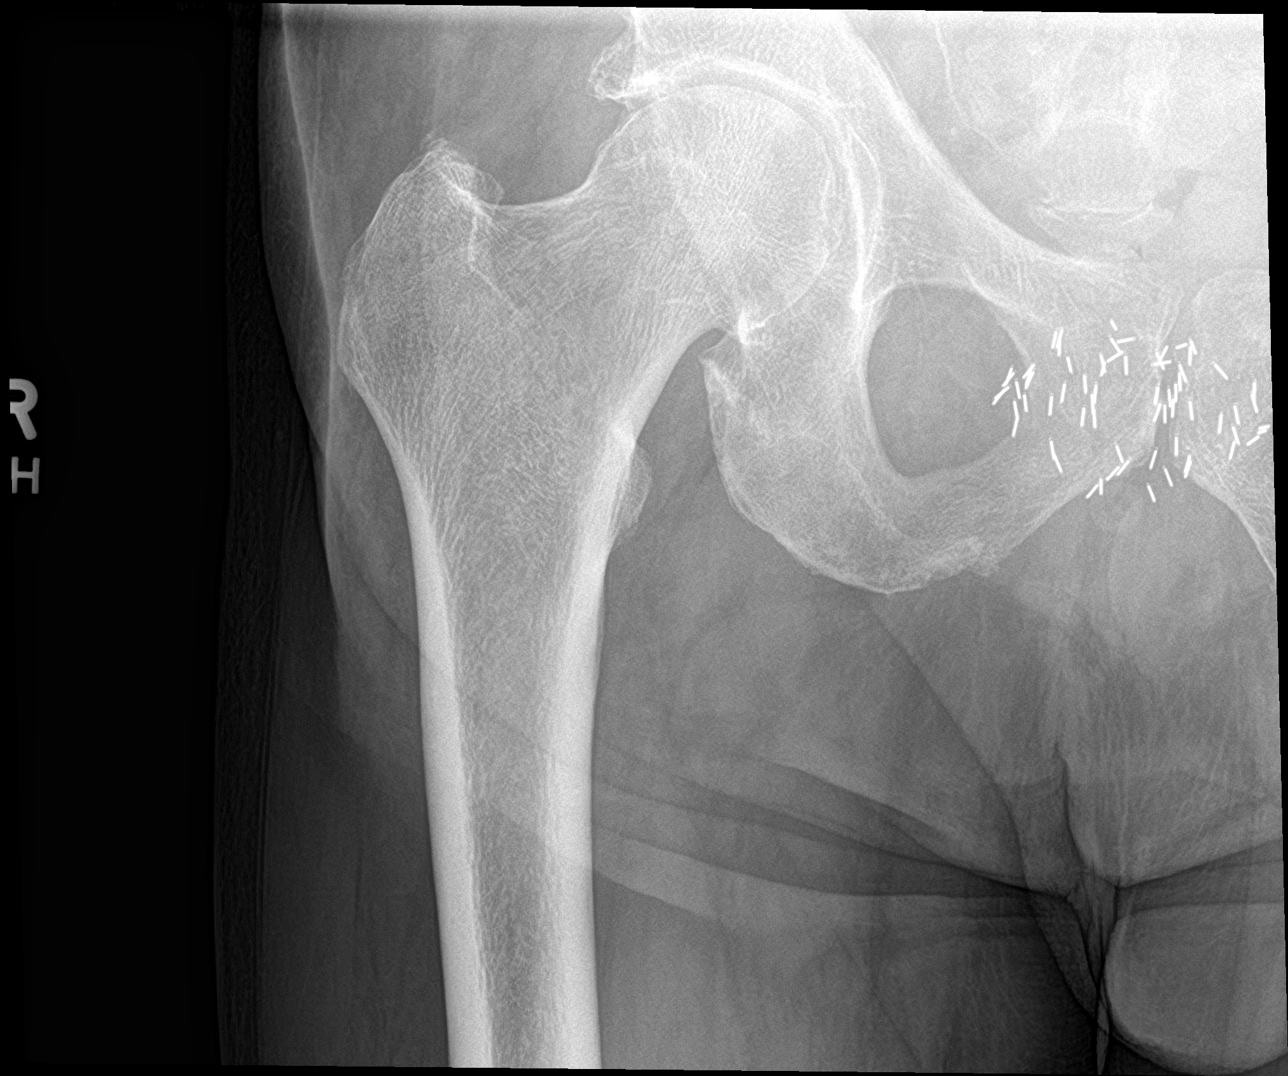

[hip lat]
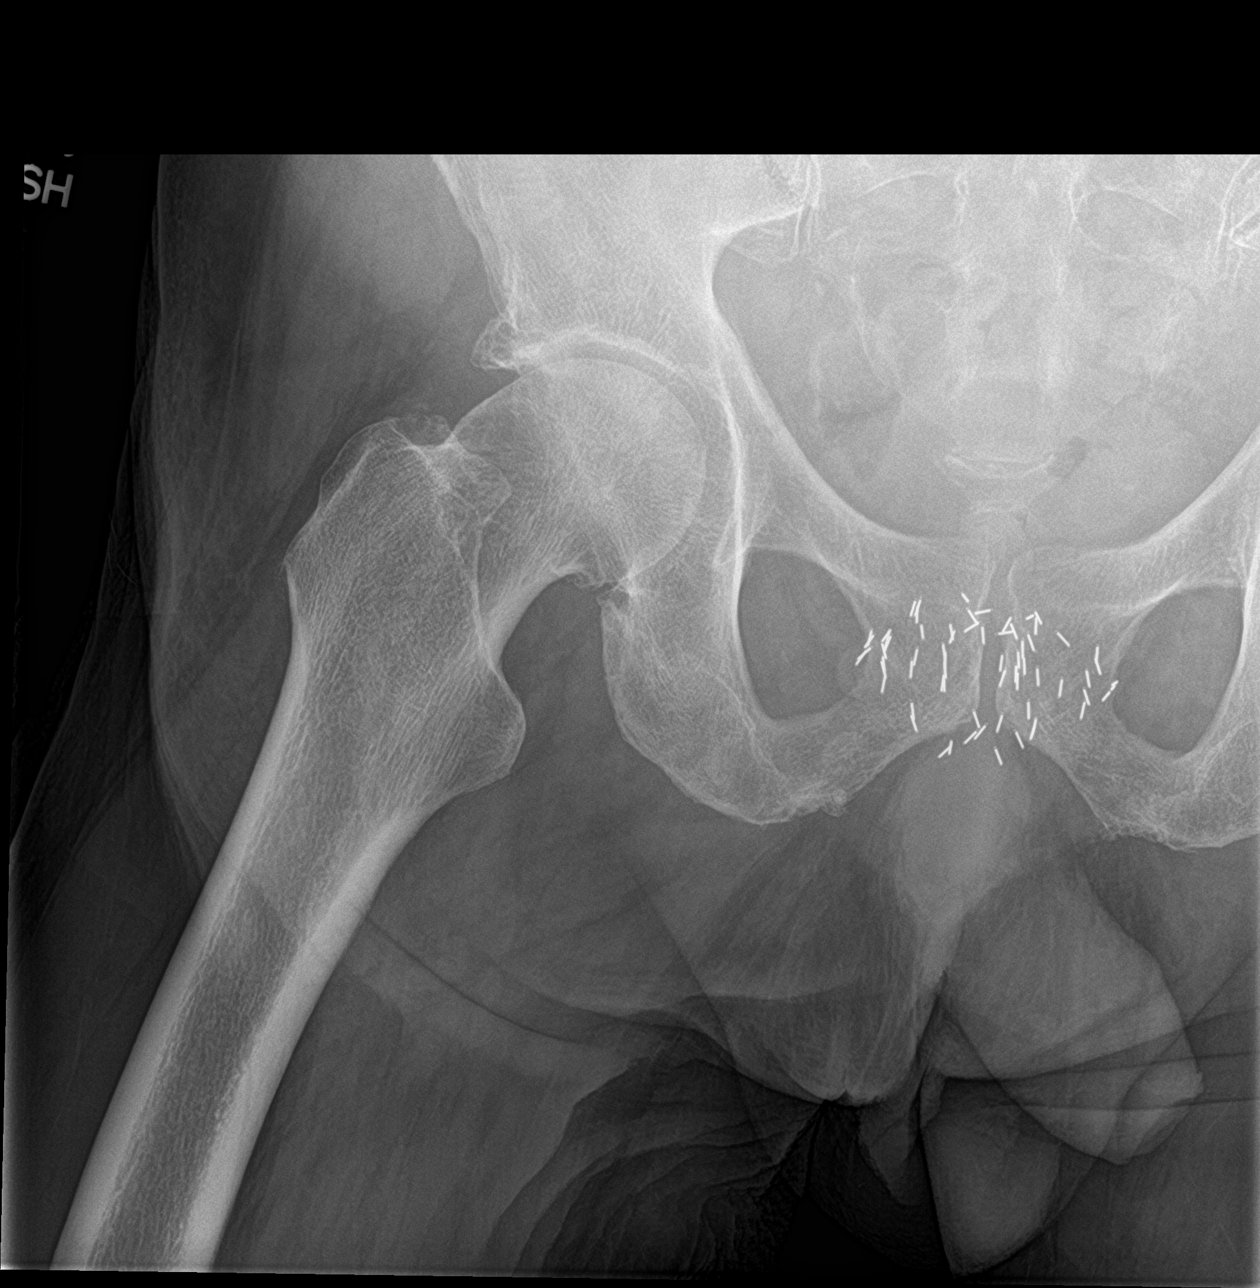

[3 of 3 positions shown; findings below may reference images not displayed]

FINDINGS: Radiation seeds noted in the region of the prostate.

Mild joint space loss and spurring of both hips.

No fractures or dislocations.
IMPRESSION: Mild right hip osteoarthrosis.

## 2021-11-29 DIAGNOSIS — I440 Atrioventricular block, first degree: Secondary | ICD-10-CM | POA: Diagnosis not present

## 2021-11-29 DIAGNOSIS — N1831 Chronic kidney disease, stage 3a: Secondary | ICD-10-CM | POA: Diagnosis not present

## 2021-11-29 DIAGNOSIS — K55059 Acute (reversible) ischemia of intestine, part and extent unspecified: Secondary | ICD-10-CM | POA: Diagnosis not present

## 2021-11-29 DIAGNOSIS — I130 Hypertensive heart and chronic kidney disease with heart failure and stage 1 through stage 4 chronic kidney disease, or unspecified chronic kidney disease: Secondary | ICD-10-CM | POA: Diagnosis not present

## 2021-11-29 DIAGNOSIS — I080 Rheumatic disorders of both mitral and aortic valves: Secondary | ICD-10-CM | POA: Diagnosis not present

## 2021-11-29 DIAGNOSIS — I4819 Other persistent atrial fibrillation: Secondary | ICD-10-CM | POA: Diagnosis not present

## 2021-11-29 DIAGNOSIS — I5031 Acute diastolic (congestive) heart failure: Secondary | ICD-10-CM | POA: Diagnosis not present

## 2021-11-29 DIAGNOSIS — Z9181 History of falling: Secondary | ICD-10-CM | POA: Diagnosis not present

## 2021-11-29 DIAGNOSIS — F419 Anxiety disorder, unspecified: Secondary | ICD-10-CM | POA: Diagnosis not present

## 2021-11-29 DIAGNOSIS — E785 Hyperlipidemia, unspecified: Secondary | ICD-10-CM | POA: Diagnosis not present

## 2021-11-29 DIAGNOSIS — K5792 Diverticulitis of intestine, part unspecified, without perforation or abscess without bleeding: Secondary | ICD-10-CM | POA: Diagnosis not present

## 2021-11-29 DIAGNOSIS — Z7901 Long term (current) use of anticoagulants: Secondary | ICD-10-CM | POA: Diagnosis not present

## 2021-11-29 DIAGNOSIS — C61 Malignant neoplasm of prostate: Secondary | ICD-10-CM | POA: Diagnosis not present

## 2021-11-29 NOTE — Telephone Encounter (Signed)
This is Dr. Christopher's pt 

## 2021-12-06 ENCOUNTER — Other Ambulatory Visit: Payer: Self-pay

## 2021-12-06 ENCOUNTER — Ambulatory Visit (HOSPITAL_COMMUNITY): Payer: Medicare Other | Admitting: General Practice

## 2021-12-06 ENCOUNTER — Ambulatory Visit (HOSPITAL_COMMUNITY)
Admission: RE | Admit: 2021-12-06 | Discharge: 2021-12-06 | Disposition: A | Discharge status: Home / Self Care | Payer: Medicare Other | Attending: Cardiology | Admitting: Cardiology

## 2021-12-06 ENCOUNTER — Encounter (HOSPITAL_COMMUNITY): Payer: Self-pay | Admitting: Cardiology

## 2021-12-06 ENCOUNTER — Encounter (HOSPITAL_COMMUNITY)
Admission: RE | Disposition: A | Discharge status: Home / Self Care | Payer: Self-pay | Source: Home / Self Care | Attending: Cardiology

## 2021-12-06 DIAGNOSIS — I480 Paroxysmal atrial fibrillation: Secondary | ICD-10-CM | POA: Diagnosis not present

## 2021-12-06 DIAGNOSIS — I5033 Acute on chronic diastolic (congestive) heart failure: Secondary | ICD-10-CM | POA: Diagnosis not present

## 2021-12-06 DIAGNOSIS — N179 Acute kidney failure, unspecified: Secondary | ICD-10-CM | POA: Diagnosis not present

## 2021-12-06 DIAGNOSIS — I340 Nonrheumatic mitral (valve) insufficiency: Secondary | ICD-10-CM | POA: Insufficient documentation

## 2021-12-06 DIAGNOSIS — Z7901 Long term (current) use of anticoagulants: Secondary | ICD-10-CM | POA: Insufficient documentation

## 2021-12-06 DIAGNOSIS — I5032 Chronic diastolic (congestive) heart failure: Secondary | ICD-10-CM | POA: Diagnosis not present

## 2021-12-06 DIAGNOSIS — E785 Hyperlipidemia, unspecified: Secondary | ICD-10-CM | POA: Diagnosis not present

## 2021-12-06 DIAGNOSIS — I4819 Other persistent atrial fibrillation: Secondary | ICD-10-CM

## 2021-12-06 DIAGNOSIS — N1831 Chronic kidney disease, stage 3a: Secondary | ICD-10-CM | POA: Diagnosis not present

## 2021-12-06 DIAGNOSIS — D6869 Other thrombophilia: Secondary | ICD-10-CM | POA: Insufficient documentation

## 2021-12-06 DIAGNOSIS — I130 Hypertensive heart and chronic kidney disease with heart failure and stage 1 through stage 4 chronic kidney disease, or unspecified chronic kidney disease: Secondary | ICD-10-CM | POA: Insufficient documentation

## 2021-12-06 DIAGNOSIS — Z79899 Other long term (current) drug therapy: Secondary | ICD-10-CM | POA: Insufficient documentation

## 2021-12-06 HISTORY — PX: CARDIOVERSION: SHX1299

## 2021-12-06 LAB — POCT I-STAT, CHEM 8
BUN: 21 mg/dL (ref 8–23)
Calcium, Ion: 1.16 mmol/L (ref 1.15–1.40)
Chloride: 96 mmol/L — ABNORMAL LOW (ref 98–111)
Creatinine, Ser: 1.3 mg/dL — ABNORMAL HIGH (ref 0.61–1.24)
Glucose, Bld: 86 mg/dL (ref 70–99)
HCT: 42 % (ref 39.0–52.0)
Hemoglobin: 14.3 g/dL (ref 13.0–17.0)
Potassium: 4.4 mmol/L (ref 3.5–5.1)
Sodium: 131 mmol/L — ABNORMAL LOW (ref 135–145)
TCO2: 26 mmol/L (ref 22–32)

## 2021-12-06 SURGERY — CARDIOVERSION
Anesthesia: General

## 2021-12-06 MED ORDER — PROPOFOL 10 MG/ML IV BOLUS
INTRAVENOUS | Status: DC | PRN
  Administered 2021-12-06: 60 mg via INTRAVENOUS

## 2021-12-06 MED ORDER — SODIUM CHLORIDE 0.9 % IV SOLN: INTRAVENOUS | Status: DC

## 2021-12-06 MED ORDER — LIDOCAINE 2% (20 MG/ML) 5 ML SYRINGE
INTRAMUSCULAR | Status: DC | PRN
  Administered 2021-12-06: 80 mg via INTRAVENOUS

## 2021-12-06 MED ORDER — HYDROCORTISONE 1 % EX CREA: 1.0000 "application " | TOPICAL_CREAM | Freq: Three times a day (TID) | CUTANEOUS | Status: DC | PRN

## 2021-12-06 NOTE — Anesthesia Procedure Notes (Signed)
Procedure Name: General with mask airway Date/Time: 12/06/2021 7:46 AM Performed by: Ardyth Harps, CRNA Pre-anesthesia Checklist: Patient identified, Emergency Drugs available, Suction available, Patient being monitored and Timeout performed Patient Re-evaluated:Patient Re-evaluated prior to induction Oxygen Delivery Method: Ambu bag Preoxygenation: Pre-oxygenation with 100% oxygen Induction Type: IV induction Dental Injury: Teeth and Oropharynx as per pre-operative assessment

## 2021-12-06 NOTE — Anesthesia Preprocedure Evaluation (Signed)
Anesthesia Evaluation  Patient identified by MRN, date of birth, ID band Patient awake    Reviewed: Allergy & Precautions, NPO status , Patient's Chart, lab work & pertinent test results, reviewed documented beta blocker date and time   Airway Mallampati: II  TM Distance: >3 FB Neck ROM: Full    Dental  (+) Dental Advisory Given, Caps   Pulmonary neg pulmonary ROS,    Pulmonary exam normal breath sounds clear to auscultation       Cardiovascular hypertension, Pt. on medications and Pt. on home beta blockers + dysrhythmias Atrial Fibrillation + Valvular Problems/Murmurs  Rhythm:Irregular Rate:Normal  Hx/o superior mesenteric vein thrombosis   Neuro/Psych Anxiety Bilateral hearing loss- wears hearing aids  Neuromuscular disease    GI/Hepatic negative GI ROS, Neg liver ROS,   Endo/Other  Hyperlipidemia  Renal/GU Renal InsufficiencyRenal disease  negative genitourinary   Musculoskeletal negative musculoskeletal ROS (+)   Abdominal   Peds  Hematology Eliquis therapy- last dose this am   Anesthesia Other Findings   Reproductive/Obstetrics                             Anesthesia Physical  Anesthesia Plan  ASA: 3  Anesthesia Plan: General   Post-op Pain Management:    Induction: Intravenous  PONV Risk Score and Plan: 2 and Treatment may vary due to age or medical condition  Airway Management Planned: Natural Airway and Simple Face Mask  Additional Equipment:   Intra-op Plan:   Post-operative Plan:   Informed Consent: I have reviewed the patients History and Physical, chart, labs and discussed the procedure including the risks, benefits and alternatives for the proposed anesthesia with the patient or authorized representative who has indicated his/her understanding and acceptance.     Dental advisory given  Plan Discussed with: CRNA and Anesthesiologist  Anesthesia Plan Comments:          Anesthesia Quick Evaluation

## 2021-12-06 NOTE — Anesthesia Postprocedure Evaluation (Signed)
Anesthesia Post Note  Patient: Mark Stark.  Procedure(s) Performed: CARDIOVERSION     Patient location during evaluation: PACU Anesthesia Type: General Level of consciousness: awake and alert Pain management: pain level controlled Vital Signs Assessment: post-procedure vital signs reviewed and stable Respiratory status: spontaneous breathing, nonlabored ventilation and respiratory function stable Cardiovascular status: blood pressure returned to baseline and stable Postop Assessment: no apparent nausea or vomiting Anesthetic complications: no   No notable events documented.  Last Vitals:  Vitals:   12/06/21 0812 12/06/21 0820  BP: 117/61 103/67  Pulse: 64 66  Resp: (!) 22 13  Temp:    SpO2: 97% 100%    Last Pain:  Vitals:   12/06/21 0700  TempSrc: Other (Comment)  PainSc:                  Lynda Rainwater

## 2021-12-06 NOTE — CV Procedure (Signed)
Procedure:   DCCV  Indication:  Symptomatic atrial fibrillation  Procedure Note:  The patient signed informed consent.  They have had had therapeutic anticoagulation with Eliquis greater than 3 weeks.  Anesthesia was administered by Dr. Sabra Heck and Rexford Maus, CRNA.  Adequate airway was maintained throughout and vital followed per protocol.  They were cardioverted x 1 with 200J of biphasic synchronized energy.  They converted to NSR with rate 60s.  There were no apparent complications.  The patient had normal neuro status and respiratory status post procedure with vitals stable as recorded elsewhere.    Follow up:  They will continue on current medical therapy and follow up with cardiology as scheduled.  Oswaldo Milian, MD 12/06/2021 8:08 AM

## 2021-12-06 NOTE — Interval H&P Note (Signed)
History and Physical Interval Note:  12/06/2021 7:32 AM  Mark Stark.  has presented today for surgery, with the diagnosis of A-FIB.  The various methods of treatment have been discussed with the patient and family. After consideration of risks, benefits and other options for treatment, the patient has consented to  Procedure(s): CARDIOVERSION (N/A) as a surgical intervention.  The patient's history has been reviewed, patient examined, no change in status, stable for surgery.  I have reviewed the patient's chart and labs.  Questions were answered to the patient's satisfaction.     Donato Heinz

## 2021-12-06 NOTE — Transfer of Care (Signed)
Immediate Anesthesia Transfer of Care Note  Patient: Mark Stark.  Procedure(s) Performed: CARDIOVERSION  Patient Location: Endoscopy Unit  Anesthesia Type:General  Level of Consciousness: awake and drowsy  Airway & Oxygen Therapy: Patient Spontanous Breathing  Post-op Assessment: Report given to RN and Post -op Vital signs reviewed and stable  Post vital signs: Reviewed and stable  Last Vitals:  Vitals Value Taken Time  BP    Temp    Pulse    Resp    SpO2      Last Pain:  Vitals:   12/06/21 0700  TempSrc: Other (Comment)  PainSc:          Complications: No notable events documented.

## 2021-12-06 NOTE — Discharge Instructions (Signed)

## 2021-12-08 ENCOUNTER — Encounter (HOSPITAL_COMMUNITY): Payer: Self-pay | Admitting: Cardiology

## 2021-12-09 ENCOUNTER — Ambulatory Visit (INDEPENDENT_AMBULATORY_CARE_PROVIDER_SITE_OTHER): Payer: Medicare Other

## 2021-12-09 DIAGNOSIS — I4819 Other persistent atrial fibrillation: Secondary | ICD-10-CM

## 2021-12-09 DIAGNOSIS — I5032 Chronic diastolic (congestive) heart failure: Secondary | ICD-10-CM

## 2021-12-09 NOTE — Chronic Care Management (AMB) (Signed)
Chronic Care Management   CCM RN Visit Note  12/09/2021 Name: Mark Stark. MRN: [id number] DOB: [birth year]  Subjective: Mark Stark. is a 87 y.o. year old male who is a primary care patient of [name], DO. The care management team was consulted for assistance with disease management and care coordination needs.    Engaged with patient by telephone for follow up visit in response to provider referral for case management and/or care coordination services.   Consent to Services:  The patient was given information about Chronic Care Management services, agreed to services, and gave verbal consent prior to initiation of services.  Please see initial visit note for detailed documentation.   Patient agreed to services and verbal consent obtained.   Assessment: Review of patient past medical history, allergies, medications, health status, including review of consultants reports, laboratory and other test data, was performed as part of comprehensive evaluation and provision of chronic care management services.   SDOH (Social Determinants of Health) assessments and interventions performed:    CCM Care Plan  Allergies  Allergen Reactions   Sulfonamide Derivatives Other (See Comments)    Unknown    Outpatient Encounter Medications as of 12/09/2021  Medication Sig Note   amiodarone (PACERONE) 200 MG tablet TAKE 1 TABLET BY MOUTH TWICE A DAY    b complex vitamins tablet Take 1 tablet by mouth daily.    Calcium Carbonate-Vitamin D 600-400 MG-UNIT tablet Take 1 tablet by mouth daily.    clorazepate (TRANXENE) 15 MG tablet TAKE 1 TABLET BY MOUTH EVERY DAY AT BEDTIME AS NEEDED (Patient taking differently: Take 15 mg by mouth at bedtime as needed for sleep or anxiety.)    cromolyn (NASALCROM) 5.2 MG/ACT nasal spray Place 2 sprays into the nose 2 (two) times daily.    doxazosin (CARDURA) 4 MG tablet Take 1 tablet (4 mg total) by mouth at bedtime.    ELIQUIS 5 MG TABS tablet TAKE 1  TABLET BY MOUTH TWICE A DAY (Patient taking differently: Take 5 mg by mouth 2 (two) times daily.)    fenofibrate 160 MG tablet TAKE 1 TABLET BY MOUTH EVERY DAY    fish oil-omega-3 fatty acids 1000 MG capsule Take 1 g by mouth 3 (three) times daily. 11/08/2021: Reports takes twice a day.   metoprolol succinate (TOPROL XL) 25 MG 24 hr tablet Take 1 tablet (25 mg total) by mouth daily.    Multiple Vitamins-Minerals (MULTIVITAMIN PO) Take 1 tablet by mouth daily.    Multiple Vitamins-Minerals (PRESERVISION AREDS 2) CAPS 2 po qd (Patient taking differently: Take 1 tablet by mouth in the morning and at bedtime.)    potassium chloride (KLOR-CON) 10 MEQ tablet TAKE 1 TABLET BY MOUTH EVERY DAY    simvastatin (ZOCOR) 10 MG tablet TAKE 1 TABLET BY MOUTH EVERYDAY AT BEDTIME    torsemide (DEMADEX) 20 MG tablet TAKE 1 TABLET BY MOUTH EVERY DAY    vitamin C (ASCORBIC ACID) 500 MG tablet Take 500 mg by mouth daily.    No facility-administered encounter medications on file as of 12/09/2021.    Patient Active Problem List   Diagnosis Date Noted   Chronic diastolic CHF (congestive heart failure) ([place]) 11/09/2021   Nonrheumatic mitral valve regurgitation 10/28/2021   Acute kidney injury superimposed on CKD ([place]) 60/63/0160   Acute diastolic CHF (congestive heart failure) ([place]) 10/25/2021   Secondary hypercoagulable state ([place]) 08/23/2021   Prostate CA ([place]) 07/28/2021   Thrombocytopenia ([place]) 03/30/2021  Screening for HIV (human immunodeficiency virus) 03/30/2021   Monocytosis 03/30/2021   Abnormal platelets ([place]) 03/28/2021   Hip pain 03/28/2021   Persistent atrial fibrillation ([facility])    Paroxysmal atrial fibrillation ([place]) 01/12/2018   Cystoid macular edema of right eye 11/29/2017   Macular degeneration 12/21/2015   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 eye with active choroidal neovascularization ([place]) 04/30/2014   Myalgia and myositis 11/16/2013   Epiretinal membrane 11/07/2012    Posterior vitreous detachment 11/07/2012   Status post intraocular lens implant 11/07/2012   Fungal ear infection 04/09/2012   Abnormal laboratory test 03/27/2011   Aortic insufficiency 26/37/8588   SYSTOLIC MURMUR 50/27/7412   EDEMA 12/05/2010   ECCHYMOSES, SPONTANEOUS 11/28/2010   CONTUSION, RIGHT THIGH 11/28/2010   DECREASED HEARING, BILATERAL 09/23/2010   Pain in limb 07/28/2009   ANKLE SPRAIN, LEFT 07/28/2009   CONTUSION, LOWER LEG, LEFT 07/28/2009   DERMATITIS, CONTACT, NOS 10/02/2007   Essential hypertension 05/07/2007   PROSTATE CANCER, HX OF 03/13/2007   Hyperlipidemia 01/15/2007   Anxiety state 01/15/2007   DIVERTICULITIS, HX OF 01/15/2007    Conditions to be addressed/monitored:Atrial Fibrillation and CHF  Care Plan : RN Care Manager Plan of Care  Updates made by Luretha Rued, RN since 12/09/2021 12:00 AM     Problem: Chronic disease Managment Education and/or Care Corodination needs (CHF/Atrial Fibrillation)   Priority: High     Long-Range Goal: Development of Plan of Care for Chronic Disease Management (CHF/Atrial Fibrillation)   Start Date: 11/08/2021  Priority: High  Note:   Current Barriers:  Admitted 10/25/21-10/28/21 with acute on chronic CHF. Cardioversion done on 12/06/21 per chart converted to NSR. He continues to weigh daily and check blood pressure daily. Weight today 142.6. Weight at last telephone visit was 142 lbs. Blood pressure today 116/67. He denies any signs/symptoms of CHF exacerbation, denies any dizziness or balance problems and states, "I am doing well".  Knowledge Deficits related to plan of care for management of Atrial Fibrillation and CHF  Chronic Disease Management support and education needs related to Atrial Fibrillation and CHF  RNCM Clinical Goal(s):  Patient will verbalize understanding of plan for management of Atrial Fibrillation and CHF as evidenced by taking medications as prescribed, attending provider visit as scheduled,  notifying provider with signs/symptoms of exacerbation. verbalize basic understanding of Atrial Fibrillation and CHF disease process and self health management plan as evidenced by self report of managment take all medications exactly as prescribed and will call provider for medication related questions as evidenced by .    through collaboration with RN Care manager, provider, and care team.  Maintain and/or improved quality of life-the patient will demonstrate ongoing self health care management ability as evidenced by attending provider visits, taking medications as prescribed, continuing activities that encourage fulfullment in his life such as walking, gardening, outside activites.  Interventions: 1:1 collaboration with primary care provider regarding development and update of comprehensive plan of care as evidenced by provider attestation and co-signature Inter-disciplinary care team collaboration (see longitudinal plan of care) Evaluation of current treatment plan related to  self management and patient's adherence to plan as established by provider Provided Calender/Organizer which includes action plan for CHF and Atrial fibrillation  Heart Failure Interventions: Long Term: Goal on track: Yes Discussed importance of daily weight and advised patient to weigh and record daily Discussed the importance of keeping all appointments with provider Reviewed weights with patient and provided positive feedback on self management of health Discussed heart failure action  plan  AFIB Interventions: Long term: Goal on track: Yes Reviewed importance of adherence to anticoagulant exactly as prescribed Advised patient to discuss any questions or concerns with provider Reviewed upcoming follow up appointments Reviewed discharge post cardioversion discharge instructions with patient    Patient Goals/Self-Care Activities: Ask your provider what is your target blood pressure range Take medications as  prescribed   Attend all scheduled provider appointments Call provider office for new concerns or questions  Check blood pressure and heart rate(pulse) as recommended by your provider and notify provider if out of recommended range. know when to call the doctor: signs/symptoms of worsening heart failure: weight gain of 3 pounds in a 24 hour period or 5 pounds in a week, increased swelling of stomach, feet/legs or hands. Increased shortness of breath,, having less energy than normal, new or worsening dizziness, if it is harder for you to breathe when lying down and you need to sit up to breathe or you feel that something is not right.     Plan:Telephone follow up appointment with care management team member scheduled for:  01/06/22 The patient has been provided with contact information for the care management team and has been advised to call with any health related questions or concerns.    Thea Silversmith, RN, MSN, BSN, CCM Care Management Coordinator [facility] [phone]

## 2021-12-09 NOTE — Patient Instructions (Signed)
Visit Information  Thank you for taking time to visit with me today. Please don't hesitate to contact me if I can be of assistance to you before our next scheduled telephone appointment.  Following are the goals we discussed today:  Patient Goals/Self-Care Activities: Ask your provider what is your target blood pressure range Take medications as prescribed   Attend all scheduled provider appointments Call provider office for new concerns or questions  Check blood pressure and heart rate(pulse) as recommended by your provider and notify provider if out of recommended range. know when to call the doctor: signs/symptoms of worsening heart failure: weight gain of 3 pounds in a 24 hour period or 5 pounds in a week, increased swelling of stomach, feet/legs or hands. Increased shortness of breath,, having less energy than normal, new or worsening dizziness, if it is harder for you to breathe when lying down and you need to sit up to breathe or you feel that something is not right.  Our next appointment is by telephone on 01/06/22 at 11:00 am  Please call the care guide team at [phone] if you need to cancel or reschedule your appointment.   If you are experiencing a Mental Health or [place] or need someone to talk to, please call the Suicide and Crisis Lifeline: 988 call 1-800-273-TALK (toll free, 24 hour hotline)   The patient verbalized understanding of instructions, educational materials, and care plan provided today and agreed to receive a mailed copy of patient instructions, educational materials, and care plan.   Thea Silversmith, RN, MSN, BSN, CCM Care Management Coordinator [facility] [phone]

## 2021-12-12 ENCOUNTER — Other Ambulatory Visit: Payer: Self-pay | Admitting: Family Medicine

## 2021-12-22 ENCOUNTER — Telehealth: Payer: Self-pay | Admitting: Cardiology

## 2021-12-22 NOTE — Telephone Encounter (Signed)
Patient was returning call. Please advise ?

## 2021-12-22 NOTE — Telephone Encounter (Signed)
Attempted to call patient regarding low blood pressures, No answer unable to leave voicemail.

## 2021-12-22 NOTE — Telephone Encounter (Signed)
Pt c/o BP issue: STAT if pt c/o blurred vision, one-sided weakness or slurred speech  1. What are your last 5 BP readings?  113/68 102/50 89/59 91/111 118/68 95/53  2. Are you having any other symptoms (ex. Dizziness, headache, blurred vision, passed out)? NO  3. What is your BP issue? PT WAS ADVISED BY DR. Harrell Gave TO CALL OUR OFFICE WHENEVER HIS BP GOES BELOW 100/80 PT WAS OUT TAKING HIS DAILY WALK WHEN THIS HAPPENED.

## 2021-12-23 NOTE — Telephone Encounter (Signed)
Returned patients call regarding low blood pressures.   All these blood pressure readings were from the same day 12/29:  113/60 regular beat  Did walking exercise and moved something then Took BP 11:10- 102/50 irregular beat  89/59- regular beat 91/54- regular beat  111/58-regular beat  118/58- regular beat  92/53-regular beat 129/66- regular    Patient states he rigidly follows a Low sodium diet and wonders if a low sodium could be contributing!   Patient advised by Dr. Harrell Gave at last appointment if blood pressure is consistently dropping low to hold his Metoprolol. Patient would like to try this and trial taking his blood pressure medication in the morning instead of late at night. RN advised patient to take his blood pressure 1-2 hours after medication and keep a log for a few days and then send [place] a record of his blood pressures.    Will forward to Dr. Harrell Gave for further review!

## 2021-12-24 DIAGNOSIS — I5032 Chronic diastolic (congestive) heart failure: Secondary | ICD-10-CM

## 2021-12-24 DIAGNOSIS — I4819 Other persistent atrial fibrillation: Secondary | ICD-10-CM | POA: Diagnosis not present

## 2022-01-03 ENCOUNTER — Encounter (HOSPITAL_BASED_OUTPATIENT_CLINIC_OR_DEPARTMENT_OTHER): Payer: Self-pay | Admitting: Cardiology

## 2022-01-03 ENCOUNTER — Ambulatory Visit (INDEPENDENT_AMBULATORY_CARE_PROVIDER_SITE_OTHER): Payer: Medicare Other | Admitting: Cardiology

## 2022-01-03 ENCOUNTER — Other Ambulatory Visit: Payer: Self-pay

## 2022-01-03 VITALS — BP 116/54 | Resp 20 | Ht 68.0 in | Wt 147.8 lb

## 2022-01-03 DIAGNOSIS — I5032 Chronic diastolic (congestive) heart failure: Secondary | ICD-10-CM

## 2022-01-03 DIAGNOSIS — E782 Mixed hyperlipidemia: Secondary | ICD-10-CM | POA: Diagnosis not present

## 2022-01-03 DIAGNOSIS — I4819 Other persistent atrial fibrillation: Secondary | ICD-10-CM

## 2022-01-03 DIAGNOSIS — D6869 Other thrombophilia: Secondary | ICD-10-CM | POA: Diagnosis not present

## 2022-01-03 DIAGNOSIS — I340 Nonrheumatic mitral (valve) insufficiency: Secondary | ICD-10-CM | POA: Diagnosis not present

## 2022-01-03 MED ORDER — POTASSIUM CHLORIDE ER 10 MEQ PO TBCR: 10.0000 meq | EXTENDED_RELEASE_TABLET | Freq: Every day | ORAL | 1 refills | Status: DC | PRN

## 2022-01-03 MED ORDER — TORSEMIDE 20 MG PO TABS: 20.0000 mg | ORAL_TABLET | Freq: Every day | ORAL | 1 refills | Status: DC | PRN

## 2022-01-03 NOTE — Patient Instructions (Signed)
Medication Instructions:  Stopping amiodarone today. It will take time to completely come out of your system.   If your blood pressure is <100 on the top number and your heart rate is also <100, I would hold the next dose of metoprolol.    I updated your medication list to show that you are not on the cardura anymore and that the torsemide and potassium are as needed only. *If you need a refill on your cardiac medications before your next appointment, please call your pharmacy*   Lab Work: None If you have labs (blood work) drawn today and your tests are completely normal, you will receive your results only by: [place] (if you have MyChart) OR A paper copy in the mail If you have any lab test that is abnormal or we need to change your treatment, we will call you to review the results.    Follow-Up: At [facility], you and your health needs are our priority.  As part of our continuing mission to provide you with exceptional heart care, we have created designated Provider Care Teams.  These Care Teams include your primary Cardiologist (physician) and Advanced Practice Providers (APPs -  Physician Assistants and Nurse Practitioners) who all work together to provide you with the care you need, when you need it.  We recommend signing up for the patient portal called "MyChart".  Sign up information is provided on this After Visit Summary.  MyChart is used to connect with patients for Virtual Visits (Telemedicine).  Patients are able to view lab/test results, encounter notes, upcoming appointments, etc.  Non-urgent messages can be sent to your provider as well.   To learn more about what you can do with MyChart, go to [email or web address].    Your next appointment:   6 week(s)  The format for your next appointment:   In Person  Provider:   Buford Dresser, MD{   Other Instructions If you note that your resting heart rate is consistently more than 110, please let me  know.

## 2022-01-03 NOTE — Progress Notes (Incomplete)
Cardiology Office Note:    Date:  01/03/2022   ID:  [name], DOB January 26, 1928, MRN [id number]  PCP:  [name], [name], DO  Cardiologist:  Buford Dresser, MD  Referring MD: [name], [name], *   CC: post hospital follow up  History of Present Illness:    Akshat Minehart. is a 88 y.o. male with a hx of atrial fibrillation, chronic diastolic CHF, CKD stage 3a, hypertension, dyslipidemia, aortic insufficiency, mitral regurgitation, inferior mesenteric vein thrombosis, prostate cancer, and anxiety, who is seen for post-hospital follow up.   On presentation to the hospital 10/25/2021 he complained of worsening shortness of breath, LE edema, and orthopnea. CXR revealed small bilateral effusions and mild interstitial edema. He was admitted for congestive heart failure.  Today: Was doing very well until about 4 PM yesterday. BP had been normal, but around 4 PM blood pressure dropped into upper 44Y systolic and heart rate noted as irregular. Around 2 AM he had to wake up and sit in a chair because he felt like he couldn't breathe. BP this AM 127/62, but was 100/50 mid day.   Stopped metoprolol last night, but had otherwise been taking in the PM. Not taking doxazosin. Taking torsemide only as needed. Has a log of weights, which have been within 3 lb range.  Feels general malaise/generalized weakness. Feels like memory is worsening.  He thought he was still in rhythm, though BP monitor has shown some irregularity. He is in slow afib today. HR at home range uppers 40s-100s. We discussed continuing amiodarone and attempting another try at rhythm control vs. Stopping amiodarone and aiming for rate control. I discussed that I would use either amiodarone or metoprolol but not both given his recent symptoms.  He denies any palpitations, chest pain, or shortness of breath. No lightheadedness, headaches, syncope.   Past Medical History:  Diagnosis Date   Anxiety    Aortic  insufficiency    Atrial fibrillation (HCC)    Chronic diastolic CHF (congestive heart failure) (HCC)    Chronic kidney disease, stage 3a (HCC)    Diverticulitis    Dyslipidemia    Hypertension    Inferior mesenteric vein thrombosis (HCC)    Mitral regurgitation    Prostate cancer [facility])     Past Surgical History:  Procedure Laterality Date   CARDIOVERSION N/A 03/01/2018   Procedure: CARDIOVERSION;  Surgeon: Troy Sine, MD;  Location: [place];  Service: Cardiovascular;  Laterality: N/A;   CARDIOVERSION N/A 08/26/2021   Procedure: CARDIOVERSION;  Surgeon: Pixie Casino, MD;  Location: [place];  Service: Cardiovascular;  Laterality: N/A;   CARDIOVERSION N/A 12/06/2021   Procedure: CARDIOVERSION;  Surgeon: Donato Heinz, MD;  Location: [place];  Service: Cardiovascular;  Laterality: N/A;   CATARACT EXTRACTION  01/27/2010   right   CATARACT EXTRACTION  03/04/2010   left   Prostate seed implants     PROSTATE SURGERY      Current Medications: Current Outpatient Medications on File Prior to Visit  Medication Sig   amiodarone (PACERONE) 200 MG tablet TAKE 1 TABLET BY MOUTH TWICE A DAY   b complex vitamins tablet Take 1 tablet by mouth daily.   Calcium Carbonate-Vitamin D 600-400 MG-UNIT tablet Take 1 tablet by mouth daily.   clorazepate (TRANXENE) 15 MG tablet TAKE 1 TABLET BY MOUTH EVERY DAY AT BEDTIME AS NEEDED (Patient taking differently: Take 15 mg by mouth at bedtime as needed for sleep or anxiety.)  cromolyn (NASALCROM) 5.2 MG/ACT nasal spray Place 2 sprays into the nose 2 (two) times daily.   doxazosin (CARDURA) 4 MG tablet Take 1 tablet (4 mg total) by mouth at bedtime.   ELIQUIS 5 MG TABS tablet TAKE 1 TABLET BY MOUTH TWICE A DAY (Patient taking differently: Take 5 mg by mouth 2 (two) times daily.)   fenofibrate 160 MG tablet TAKE 1 TABLET BY MOUTH EVERY DAY   fish oil-omega-3 fatty acids 1000 MG capsule Take 1 g by mouth 3 (three) times daily.    metoprolol succinate (TOPROL XL) 25 MG 24 hr tablet Take 1 tablet (25 mg total) by mouth daily.   Multiple Vitamins-Minerals (MULTIVITAMIN PO) Take 1 tablet by mouth daily.   Multiple Vitamins-Minerals (PRESERVISION AREDS 2) CAPS 2 po qd (Patient taking differently: Take 1 tablet by mouth in the morning and at bedtime.)   potassium chloride (KLOR-CON) 10 MEQ tablet TAKE 1 TABLET BY MOUTH EVERY DAY   simvastatin (ZOCOR) 10 MG tablet TAKE 1 TABLET BY MOUTH EVERYDAY AT BEDTIME   torsemide (DEMADEX) 20 MG tablet TAKE 1 TABLET BY MOUTH EVERY DAY   vitamin C (ASCORBIC ACID) 500 MG tablet Take 500 mg by mouth daily.   No current facility-administered medications on file prior to visit.     Allergies:   Sulfonamide derivatives   Social History   Tobacco Use   Smoking status: Never   Smokeless tobacco: Never  Vaping Use   Vaping Use: Never used  Substance Use Topics   Alcohol use: No    Comment: Little wine occasionally    Drug use: No    Family History: family history includes Alzheimer's disease in his mother; Cancer (age of onset: 9) in his brother; Dementia in his mother; Mental illness in his mother.  ROS:   Please see the history of present illness.  All other systems are reviewed and negative.    EKGs/Labs/Other Studies Reviewed:    The following studies were reviewed today:  Echo 10/26/2021:  1. Left ventricular ejection fraction, by estimation, is 50 to 55%. The  left ventricle has normal function. The left ventricle has no regional  wall motion abnormalities. There is mild left ventricular hypertrophy.  Left ventricular diastolic parameters  are indeterminate.   2. Right ventricular systolic function is normal. The right ventricular  size is normal. There is moderately elevated pulmonary artery systolic  pressure.   3. Left atrial size was moderately dilated.   4. Posterior leaflet appears proplapsed. MR is moderate to severe, the  jet is eccentric and may  underestiamte the severity.   5. Aortic valve is moderately calcified. The aortic valve is normal in  structure. Aortic valve regurgitation is moderate. No aortic stenosis is  present.   6. The inferior vena cava is dilated in size with >50% respiratory  variability, suggesting right atrial pressure of 8 mmHg.   Comparison(s): No prior Echocardiogram.   Conclusion(s)/Recommendation(s): Patient has moderate to severe mitral  regurgitation; consider TEE if clinically indicated.   Monitor 01/21/2018: Atrial fib Rare 2 - 3 second pauses.  EKG:  EKG is personally reviewed.   01/03/2022: EKG was not ordered. 11/07/2021: atrial fibrillation at 66 bpm  Recent Labs: 10/25/2021: ALT 22; B Natriuretic Peptide 623.5; Magnesium 1.8; TSH 2.517 11/07/2021: Platelets 152 12/06/2021: BUN 21; Creatinine, Ser 1.30; Hemoglobin 14.3; Potassium 4.4; Sodium 131   Recent Lipid Panel    Component Value Date/Time   CHOL 93 07/28/2021 1432   TRIG 50.0 07/28/2021 1432  HDL 49.00 07/28/2021 1432   CHOLHDL 2 07/28/2021 1432   VLDL 10.0 07/28/2021 1432   LDLCALC 34 07/28/2021 1432   LDLCALC 32 01/11/2018 1454    Physical Exam:    VS:  There were no vitals taken for this visit.    Wt Readings from Last 3 Encounters:  12/06/21 142 lb (64.4 kg)  11/07/21 148 lb (67.1 kg)  10/28/21 149 lb 7.6 oz (67.8 kg)    GEN: Well nourished, well developed in no acute distress HEENT: Normal, moist mucous membranes NECK: No JVD CARDIAC: ***irregularly irregular rhythm, normal S1 and S2, no rubs or gallops. ***1/6 systolic murmur. VASCULAR: Radial and DP pulses 2+ bilaterally. No carotid bruits RESPIRATORY:  Clear to auscultation without rales, wheezing or rhonchi  ABDOMEN: Soft, non-tender, non-distended MUSCULOSKELETAL:  Ambulates independently SKIN: Warm and dry, no edema NEUROLOGIC:  Alert and oriented x 3. No focal neuro deficits noted. PSYCHIATRIC:  Normal affect    ASSESSMENT:    No diagnosis  found.  PLAN:    Chronic diastolic heart failure with recent admission Moderate to severe mitral regurgitation when volume overloaded -NYHA class 3 on admission, now NYHA class II -echo with low normal EF, moderate to severe MR at peak congestion -murmur much quieter, suspect MR now improved -has continued to lose significant weight, appears euvolemic to dry -check BMET, CBC today. If renal function is worse, will decrease diuretics. He is nervous about reaccumulating fluid, discussed continuing home weights, watching for swelling/shortness of breath, etc   Atrial fibrillation, persistent -CHA2DS2/VAS Stroke Risk Points=4  -continue apixaban 5 mg BID. Meets age criteria, monitor renal function. Weight is >60 kg -he is tolerating amiodarone. Drop to 200 mg daily today -we discussed cardioversion today, he is amenable and wants me to be the one to perform the procedure -Shared Decision Making/Informed Consent{ The risks (stroke, cardiac arrhythmias rarely resulting in the need for a temporary or permanent pacemaker, skin irritation or burns and complications associated with conscious sedation including aspiration, arrhythmia, respiratory failure and death), benefits (restoration of normal sinus rhythm) and alternatives of a direct current cardioversion were explained in detail to Mr. Postema and he agrees to proceed.     Hyperlipidemia -continue simvastatin, reasonable for age -on fenofibrate as well   Hypertension -continue metoprolol -we have discussed risk of orthostasis with doxazosin  Cardiac risk counseling and prevention recommendations: -recommend heart healthy/Mediterranean diet, with whole grains, fruits, vegetable, fish, lean meats, nuts, and olive oil. Limit salt. -recommend moderate walking, 3-5 times/week for 30-50 minutes each session. Aim for at least 150 minutes.week. Goal should be pace of 3 miles/hours, or walking 1.5 miles in 30 minutes -recommend avoidance of tobacco  products. Avoid excess alcohol. -ASCVD risk score: The ASCVD Risk score (Arnett DK, et al., 2019) failed to calculate for the following reasons:   The 2019 ASCVD risk score is only valid for ages 57 to 29    Plan for follow up: ***Scheduled cardioversion with me on 12/06/21 or sooner as needed.   Medication Adjustments/Labs and Tests Ordered: Current medicines are reviewed at length with the patient today.  Concerns regarding medicines are outlined above.   No orders of the defined types were placed in this encounter.  No orders of the defined types were placed in this encounter.  There are no Patient Instructions on file for this visit.   Signed, Buford Dresser, MD PhD 01/03/2022 9:48 AM    [place]

## 2022-01-05 DIAGNOSIS — I4891 Unspecified atrial fibrillation: Secondary | ICD-10-CM | POA: Diagnosis not present

## 2022-01-05 DIAGNOSIS — H35373 Puckering of macula, bilateral: Secondary | ICD-10-CM | POA: Diagnosis not present

## 2022-01-05 DIAGNOSIS — S0512XA Contusion of eyeball and orbital tissues, left eye, initial encounter: Secondary | ICD-10-CM | POA: Insufficient documentation

## 2022-01-05 DIAGNOSIS — H353231 Exudative age-related macular degeneration, bilateral, with active choroidal neovascularization: Secondary | ICD-10-CM | POA: Diagnosis not present

## 2022-01-05 DIAGNOSIS — Z7901 Long term (current) use of anticoagulants: Secondary | ICD-10-CM | POA: Diagnosis not present

## 2022-01-05 DIAGNOSIS — W19XXXA Unspecified fall, initial encounter: Secondary | ICD-10-CM | POA: Diagnosis not present

## 2022-01-05 DIAGNOSIS — H43813 Vitreous degeneration, bilateral: Secondary | ICD-10-CM | POA: Diagnosis not present

## 2022-01-06 ENCOUNTER — Ambulatory Visit (INDEPENDENT_AMBULATORY_CARE_PROVIDER_SITE_OTHER): Payer: Medicare Other

## 2022-01-06 ENCOUNTER — Telehealth: Payer: Medicare Other

## 2022-01-06 DIAGNOSIS — N401 Enlarged prostate with lower urinary tract symptoms: Secondary | ICD-10-CM | POA: Diagnosis not present

## 2022-01-06 DIAGNOSIS — I5032 Chronic diastolic (congestive) heart failure: Secondary | ICD-10-CM

## 2022-01-06 DIAGNOSIS — R3911 Hesitancy of micturition: Secondary | ICD-10-CM | POA: Diagnosis not present

## 2022-01-06 DIAGNOSIS — I4819 Other persistent atrial fibrillation: Secondary | ICD-10-CM

## 2022-01-06 NOTE — Patient Instructions (Signed)
Visit Information  Thank you for taking time to visit with me today. Please don't hesitate to contact me if I can be of assistance to you before our next scheduled telephone appointment.  Following are the goals we discussed today:  Patient Goals/Self-Care Activities: Take medications as prescribed   Attend all scheduled provider appointments Call provider office for new concerns or questions  Continue to check blood pressure and heart rate(pulse) as recommended by your provider and notify provider if out of recommended range. Call your cardiologist or primary care provider: signs/symptoms of worsening heart failure: weight gain of 3 pounds in a 24 hour period or 5 pounds in a week, increased swelling of stomach, feet/legs or hands. Increased shortness of breath, having less energy than normal, new or worsening dizziness, if it is harder for you to breathe when lying down and you need to sit up to breathe or you feel that something is not right. Continue to remain active per provider recommendations Eat Healthy: whole grains, lean meats, fruits, vegetable, healthy fats, monitor salt intake Contact your RN Care Manager as needed for care coordination and/or care management needs  Our next appointment is by telephone on 03/03/22 at 2:00 pm  Please call the care guide team at [phone] if you need to cancel or reschedule your appointment.   If you are experiencing a Mental Health or [place] or need someone to talk to, please call the Suicide and Crisis Lifeline: 988 call 1-800-273-TALK (toll free, 24 hour hotline)   The patient verbalized understanding of instructions, educational materials, and care plan provided today and agreed to receive a mailed copy of patient instructions, educational materials, and care plan.   Thea Silversmith, RN, MSN, BSN, CCM Care Management Coordinator [facility] [phone]

## 2022-01-06 NOTE — Chronic Care Management (AMB) (Signed)
Chronic Care Management   CCM RN Visit Note  01/06/2022 Name: Quincey Quesinberry. MRN: [id number] DOB: June 07, 1928  Subjective: Mika Anastasi. is a 88 y.o. year old male who is a primary care patient of [name], DO. The care management team was consulted for assistance with disease management and care coordination needs.    Engaged with patient by telephone for follow up visit in response to provider referral for case management and/or care coordination services.   Consent to Services:  The patient was given information about Chronic Care Management services, agreed to services, and gave verbal consent prior to initiation of services.  Please see initial visit note for detailed documentation.   Patient agreed to services and verbal consent obtained.   Assessment: Review of patient past medical history, allergies, medications, health status, including review of consultants reports, laboratory and other test data, was performed as part of comprehensive evaluation and provision of chronic care management services.   SDOH (Social Determinants of Health) assessments and interventions performed:    CCM Care Plan  Allergies  Allergen Reactions   Sulfonamide Derivatives Other (See Comments)    Unknown    Outpatient Encounter Medications as of 01/06/2022  Medication Sig Note   b complex vitamins tablet Take 1 tablet by mouth daily.    Calcium Carbonate-Vitamin D 600-400 MG-UNIT tablet Take 1 tablet by mouth daily.    clorazepate (TRANXENE) 15 MG tablet TAKE 1 TABLET BY MOUTH EVERY DAY AT BEDTIME AS NEEDED (Patient taking differently: Take 15 mg by mouth at bedtime as needed for sleep or anxiety.)    cromolyn (NASALCROM) 5.2 MG/ACT nasal spray Place 2 sprays into the nose 2 (two) times daily.    ELIQUIS 5 MG TABS tablet TAKE 1 TABLET BY MOUTH TWICE A DAY (Patient taking differently: Take 5 mg by mouth 2 (two) times daily.)    fenofibrate 160 MG tablet TAKE 1 TABLET BY MOUTH EVERY DAY     fish oil-omega-3 fatty acids 1000 MG capsule Take 1 g by mouth 3 (three) times daily. 11/08/2021: Reports takes twice a day.   metoprolol succinate (TOPROL XL) 25 MG 24 hr tablet Take 1 tablet (25 mg total) by mouth daily.    Multiple Vitamins-Minerals (MULTIVITAMIN PO) Take 1 tablet by mouth daily.    Multiple Vitamins-Minerals (PRESERVISION AREDS 2) CAPS 2 po qd (Patient taking differently: Take 1 tablet by mouth in the morning and at bedtime.)    potassium chloride (KLOR-CON) 10 MEQ tablet Take 1 tablet (10 mEq total) by mouth daily as needed (take when you take torsemide).    simvastatin (ZOCOR) 10 MG tablet TAKE 1 TABLET BY MOUTH EVERYDAY AT BEDTIME    tamsulosin (FLOMAX) 0.4 MG CAPS capsule Take 0.4 mg by mouth at bedtime.    torsemide (DEMADEX) 20 MG tablet Take 1 tablet (20 mg total) by mouth daily as needed (if weight up 3 pounds overnight or 5 lbs from baseline). TAKE 1 TABLET BY MOUTH EVERY DAY    vitamin C (ASCORBIC ACID) 500 MG tablet Take 500 mg by mouth daily.    No facility-administered encounter medications on file as of 01/06/2022.    Patient Active Problem List   Diagnosis Date Noted   Chronic diastolic CHF (congestive heart failure) ([place]) 11/09/2021   Nonrheumatic mitral valve regurgitation 10/28/2021   Acute kidney injury superimposed on CKD ([place]) 26/94/8546   Acute diastolic CHF (congestive heart failure) ([place]) 10/25/2021   Secondary hypercoagulable state ([place]) 08/23/2021  Prostate CA ([place]) 07/28/2021   Thrombocytopenia ([place]) 03/30/2021   Screening for HIV (human immunodeficiency virus) 03/30/2021   Monocytosis 03/30/2021   Abnormal platelets ([place]) 03/28/2021   Hip pain 03/28/2021   Persistent atrial fibrillation (HCC)    Paroxysmal atrial fibrillation ([place]) 01/12/2018   Cystoid macular edema of right eye 11/29/2017   Macular degeneration 12/21/2015   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 eye with active choroidal neovascularization ([place])  04/30/2014   Myalgia and myositis 11/16/2013   Epiretinal membrane 11/07/2012   Posterior vitreous detachment 11/07/2012   Status post intraocular lens implant 11/07/2012   Fungal ear infection 04/09/2012   Abnormal laboratory test 03/27/2011   Aortic insufficiency 16/09/9603   SYSTOLIC MURMUR 54/08/8118   EDEMA 12/05/2010   ECCHYMOSES, SPONTANEOUS 11/28/2010   CONTUSION, RIGHT THIGH 11/28/2010   DECREASED HEARING, BILATERAL 09/23/2010   Pain in limb 07/28/2009   ANKLE SPRAIN, LEFT 07/28/2009   CONTUSION, LOWER LEG, LEFT 07/28/2009   DERMATITIS, CONTACT, NOS 10/02/2007   Essential hypertension 05/07/2007   PROSTATE CANCER, HX OF 03/13/2007   Hyperlipidemia 01/15/2007   Anxiety state 01/15/2007   DIVERTICULITIS, HX OF 01/15/2007    Conditions to be addressed/monitored:Atrial Fibrillation and CHF  Care Plan : RN Care Manager Plan of Care  Updates made by Luretha Rued, RN since 01/06/2022 12:00 AM     Problem: Chronic disease Managment Education and/or Care Corodination needs (CHF/Atrial Fibrillation)   Priority: High     Long-Range Goal: Development of Plan of Care for Chronic Disease Management (CHF/Atrial Fibrillation)   Start Date: 11/08/2021  Priority: High  Note:   Current Barriers:  Per Mr. Belisle office visit with cardiologist on 01/03/22: Amiodarone and Cardura discontinued. He reports Blood pressure yesterday(01/05/22) was 130/64 HR 56, O2 sat 94%. He denies any signs/symptoms of CHF. He reports he has converted back to atrial fibrillation but no plans for any additional cardioversion. He denies any issues at this time. Per Mr. Stolar, saw Urologists today and had a good report/no change in treatment. He remains active and expresses that he is looking forward to yard work in the Spring, which he discussed with Cardiologist and given instructions from cardiologist to rest when he gets tired. He continues to see Opthalmologist for treatment of macular degeneration and reports  left eye vision improved since last visit eye appointment and right eye vision is stable. Mr. Brandt is with no questions or concerns at this time. Knowledge Deficits related to plan of care for management of Atrial Fibrillation and CHF  Chronic Disease Management support and education needs related to Atrial Fibrillation and CHF  RNCM Clinical Goal(s):  Patient will verbalize understanding of plan for management of Atrial Fibrillation and CHF as evidenced by taking medications as prescribed, attending provider visit as scheduled, notifying provider with signs/symptoms of exacerbation. verbalize basic understanding of Atrial Fibrillation and CHF disease process and self health management plan as evidenced by self report of managment take all medications exactly as prescribed and will call provider for medication related questions as evidenced by .    through collaboration with RN Care manager, provider, and care team.  Maintain and/or improved quality of life-the patient will demonstrate ongoing self health care management ability as evidenced by attending provider visits, taking medications as prescribed, continuing activities that encourage fulfullment in his life such as walking, gardening, outside activites.  Interventions: 1:1 collaboration with primary care provider regarding development and update of comprehensive plan of care as evidenced by provider attestation and co-signature  Inter-disciplinary care team collaboration (see longitudinal plan of care) Evaluation of current treatment plan related to  self management and patient's adherence to plan as established by provider Provided Calender/Organizer which includes action plan for CHF and Atrial fibrillation  Heart Failure Interventions: Long Term: Goal on track: Yes Discussed the importance of keeping all appointments with provider Provided positive feedback regarding self management of health Reviewed Medications and discussed the  importance of taking as prescribed  AFIB Interventions: Long term: Goal on track: Yes Reviewed importance of adherence to anticoagulant exactly as prescribed Advised patient to discuss any questions or concerns with provider Reviewed upcoming follow up appointments Reviewed cardiology after visit summary with patient  Patient Goals/Self-Care Activities: Take medications as prescribed   Attend all scheduled provider appointments Call provider office for new concerns or questions  Continue to check blood pressure and heart rate(pulse) as recommended by your provider and notify provider if out of recommended range. Call your cardiologist or primary care provider: signs/symptoms of worsening heart failure: weight gain of 3 pounds in a 24 hour period or 5 pounds in a week, increased swelling of stomach, feet/legs or hands. Increased shortness of breath, having less energy than normal, new or worsening dizziness, if it is harder for you to breathe when lying down and you need to sit up to breathe or you feel that something is not right. Continue to remain active per provider recommendations Eat Healthy: whole grains, lean meats, fruits, vegetable, healthy fats, monitor salt intake Contact your RN Care Manager as needed for care coordination and/or care management needs   Plan:Telephone follow up appointment with care management team member scheduled for:  03/03/22 The patient has been provided with contact information for the care management team and has been advised to call with any health related questions or concerns.   Thea Silversmith, RN, MSN, BSN, CCM Care Management Coordinator [facility] [phone]

## 2022-01-08 ENCOUNTER — Other Ambulatory Visit: Payer: Self-pay | Admitting: Family Medicine

## 2022-01-16 ENCOUNTER — Other Ambulatory Visit: Payer: Self-pay

## 2022-01-16 DIAGNOSIS — F411 Generalized anxiety disorder: Secondary | ICD-10-CM

## 2022-01-16 NOTE — Telephone Encounter (Signed)
Pt called requesting refill on clorazepate 15 MG tab.  Pharmacy is [facility].

## 2022-01-16 NOTE — Telephone Encounter (Signed)
Requesting: Tranxene Contract: 07/28/2021 UDS: 07/28/21 Last OV: 07/28/2021 Next OV: 02/06/2022 Last Refill: 09/26/2021, #30--1 RF Database:   Please advise

## 2022-01-18 MED ORDER — CLORAZEPATE DIPOTASSIUM 15 MG PO TABS: 15.0000 mg | ORAL_TABLET | Freq: Every day | ORAL | 1 refills | Status: DC

## 2022-01-24 DIAGNOSIS — I4891 Unspecified atrial fibrillation: Secondary | ICD-10-CM | POA: Diagnosis not present

## 2022-01-24 DIAGNOSIS — I509 Heart failure, unspecified: Secondary | ICD-10-CM

## 2022-01-26 ENCOUNTER — Telehealth: Payer: Self-pay | Admitting: Cardiology

## 2022-01-26 MED ORDER — METOPROLOL SUCCINATE ER 25 MG PO TB24: 12.5000 mg | ORAL_TABLET | Freq: Every day | ORAL | 3 refills | Status: DC

## 2022-01-26 NOTE — Telephone Encounter (Signed)
Pt c/o BP issue: STAT if pt c/o blurred vision, one-sided weakness or slurred speech  1. What are your last 5 BP readings? 104/74 today Did 40 mins of fast walking it was         87/60     2/1         97/65       90/57 After exercise           1/31 175/75       85/55 After exercise         2. Are you having any other symptoms (ex. Dizziness, headache, blurred vision, passed out)? He feels very tired.   3. What is your BP issue? Systolic number running low.  Patient is taking metoprolol succinate (TOPROL XL) 25 MG 24 hr tablet for his BP.  When he wakes up his BP seems to be fine, the issues seems to arise after he exercises. He does brisk walking for 40 mins.

## 2022-01-26 NOTE — Telephone Encounter (Signed)
Sounds like we need a lower Metop dose, please advise!

## 2022-01-26 NOTE — Telephone Encounter (Signed)
Called patient back with Pharmacy Recommendation. Patient is agreeable to making change to 12.5mg . Patient will try for a week and send [place] an updated on how he is feeling!   "Please check with patient to find out if he's taking the torsemide daily.  It's a PRN med, how often does he need to use it?  If he's not used any, then lets try cutting the metoprolol to 12.5 mg, but I wouldn't expect even the 25 mg to drop BP that low.  "    Medication list updated!

## 2022-01-26 NOTE — Telephone Encounter (Signed)
Please check with patient to find out if he's taking the torsemide daily.  It's a PRN med, how often does he need to use it?  If he's not used any, then lets try cutting the metoprolol to 12.5 mg, but I wouldn't expect even the 25 mg to drop BP that low.

## 2022-02-02 NOTE — Telephone Encounter (Signed)
Pt state he's noticed  great improvement since changing metoprolol to 12.5 mg daily. He state his BP is better and his energy level has significantly increased. Pt also state compared to what he's felt in the past, he feels wonderful.   2/6-124/73 2/7-124/83 2/8-125/74 2/9-124/70  Pt also wanted to report he's notice ankle swelling when he's been on his feet all day or sitting in the same position for a long period of time. He denies SOB or significant weight increase. He also report he's taken torsemide roughly 3 days, but hasn't notice any change.    2/7-140.4 lbs 2/8-140.0 lbs 2/9-141.4 lbs  Will forward for recommendations.

## 2022-02-02 NOTE — Telephone Encounter (Signed)
Patient called back to say the changes in his medication has him feeling a lot better. He would like for the nurse to give him a call. Please advise

## 2022-02-03 NOTE — Telephone Encounter (Signed)
Good to hear that decreasing the dose of metoprolol has helped. If swelling is worse at the end of the day, it's usually leaky veins, and water pill won't help. Elevating feet or wearing compression stockings to be helpful.

## 2022-02-03 NOTE — Telephone Encounter (Signed)
Pt updated and verbalized understanding.  

## 2022-02-06 ENCOUNTER — Ambulatory Visit: Payer: Medicare Other | Admitting: Family Medicine

## 2022-02-10 ENCOUNTER — Encounter: Payer: Self-pay | Admitting: Family Medicine

## 2022-02-10 ENCOUNTER — Ambulatory Visit (INDEPENDENT_AMBULATORY_CARE_PROVIDER_SITE_OTHER): Payer: Medicare Other | Admitting: Family Medicine

## 2022-02-10 VITALS — BP 120/55 | HR 74 | Temp 97.8°F | Resp 16 | Ht 65.0 in | Wt 151.0 lb

## 2022-02-10 DIAGNOSIS — I480 Paroxysmal atrial fibrillation: Secondary | ICD-10-CM

## 2022-02-10 DIAGNOSIS — Z79899 Other long term (current) drug therapy: Secondary | ICD-10-CM

## 2022-02-10 DIAGNOSIS — E785 Hyperlipidemia, unspecified: Secondary | ICD-10-CM

## 2022-02-10 DIAGNOSIS — I10 Essential (primary) hypertension: Secondary | ICD-10-CM | POA: Diagnosis not present

## 2022-02-10 DIAGNOSIS — F411 Generalized anxiety disorder: Secondary | ICD-10-CM

## 2022-02-10 DIAGNOSIS — I5031 Acute diastolic (congestive) heart failure: Secondary | ICD-10-CM | POA: Diagnosis not present

## 2022-02-10 MED ORDER — CLORAZEPATE DIPOTASSIUM 15 MG PO TABS: 15.0000 mg | ORAL_TABLET | Freq: Every day | ORAL | 1 refills | Status: DC

## 2022-02-10 NOTE — Assessment & Plan Note (Signed)
con't tranxene

## 2022-02-10 NOTE — Assessment & Plan Note (Signed)
Per cardiology 

## 2022-02-10 NOTE — Patient Instructions (Signed)
Cholesterol Content in Foods ?Cholesterol is a waxy, fat-like substance that helps to carry fat in the blood. The body needs cholesterol in small amounts, but too much cholesterol can cause damage to the arteries and heart. ?What foods have cholesterol? ?Cholesterol is found in animal-based foods, such as meat, seafood, and dairy. Generally, low-fat dairy and lean meats have less cholesterol than full-fat dairy and fatty meats. The milligrams of cholesterol per serving (mg per serving) of common cholesterol-containing foods are listed below. ?Meats and other proteins ?Egg -- one large whole egg has 186 mg. ?Veal shank -- 4 oz (113 g) has 141 mg. ?Lean ground turkey (93% lean) -- 4 oz (113 g) has 118 mg. ?Fat-trimmed lamb loin -- 4 oz (113 g) has 106 mg. ?Lean ground beef (90% lean) -- 4 oz (113 g) has 100 mg. ?Lobster -- 3.5 oz (99 g) has 90 mg. ?Pork loin chops -- 4 oz (113 g) has 86 mg. ?Canned salmon -- 3.5 oz (99 g) has 83 mg. ?Fat-trimmed beef top loin -- 4 oz (113 g) has 78 mg. ?Frankfurter -- 1 frank (3.5 oz or 99 g) has 77 mg. ?Crab -- 3.5 oz (99 g) has 71 mg. ?Roasted chicken without skin, white meat -- 4 oz (113 g) has 66 mg. ?Light bologna -- 2 oz (57 g) has 45 mg. ?Deli-cut turkey -- 2 oz (57 g) has 31 mg. ?Canned tuna -- 3.5 oz (99 g) has 31 mg. ?Bacon -- 1 oz (28 g) has 29 mg. ?Oysters and mussels (raw) -- 3.5 oz (99 g) has 25 mg. ?Mackerel -- 1 oz (28 g) has 22 mg. ?Trout -- 1 oz (28 g) has 20 mg. ?Pork sausage -- 1 link (1 oz or 28 g) has 17 mg. ?Salmon -- 1 oz (28 g) has 16 mg. ?Tilapia -- 1 oz (28 g) has 14 mg. ?Dairy ?Soft-serve ice cream -- ? cup (4 oz or 86 g) has 103 mg. ?Whole-milk yogurt -- 1 cup (8 oz or 245 g) has 29 mg. ?Cheddar cheese -- 1 oz (28 g) has 28 mg. ?American cheese -- 1 oz (28 g) has 28 mg. ?Whole milk -- 1 cup (8 oz or 250 mL) has 23 mg. ?2% milk -- 1 cup (8 oz or 250 mL) has 18 mg. ?Cream cheese -- 1 tablespoon (Tbsp) (14.5 g) has 15 mg. ?Cottage cheese -- ? cup (4 oz or 113  g) has 14 mg. ?Low-fat (1%) milk -- 1 cup (8 oz or 250 mL) has 10 mg. ?Sour cream -- 1 Tbsp (12 g) has 8.5 mg. ?Low-fat yogurt -- 1 cup (8 oz or 245 g) has 8 mg. ?Nonfat Greek yogurt -- 1 cup (8 oz or 228 g) has 7 mg. ?Half-and-half cream -- 1 Tbsp (15 mL) has 5 mg. ?Fats and oils ?Cod liver oil -- 1 tablespoon (Tbsp) (13.6 g) has 82 mg. ?Butter -- 1 Tbsp (14 g) has 15 mg. ?Lard -- 1 Tbsp (12.8 g) has 14 mg. ?Bacon grease -- 1 Tbsp (12.9 g) has 14 mg. ?Mayonnaise -- 1 Tbsp (13.8 g) has 5-10 mg. ?Margarine -- 1 Tbsp (14 g) has 3-10 mg. ?The items listed above may not be a complete list of foods with cholesterol. Exact amounts of cholesterol in these foods may vary depending on specific ingredients and brands. Contact a dietitian for more information. ?What foods do not have cholesterol? ?Most plant-based foods do not have cholesterol unless you combine them with a food that has cholesterol.   Foods without cholesterol include: ?Grains and cereals. ?Vegetables. ?Fruits. ?Vegetable oils, such as olive, canola, and sunflower oil. ?Legumes, such as peas, beans, and lentils. ?Nuts and seeds. ?Egg whites. ?The items listed above may not be a complete list of foods that do not have cholesterol. Contact a dietitian for more information. ?Summary ?The body needs cholesterol in small amounts, but too much cholesterol can cause damage to the arteries and heart. ?Cholesterol is found in animal-based foods, such as meat, seafood, and dairy. Generally, low-fat dairy and lean meats have less cholesterol than full-fat dairy and fatty meats. ?This information is not intended to replace advice given to you by your health care provider. Make sure you discuss any questions you have with your health care provider. ?Document Revised: 04/22/2021 Document Reviewed: 04/22/2021 ?Elsevier Patient Education ? 2022 Elsevier Inc. ? ?

## 2022-02-10 NOTE — Assessment & Plan Note (Signed)
Encourage heart healthy diet such as MIND or DASH diet, increase exercise, avoid trans fats, simple carbohydrates and processed foods, consider a krill or fish or flaxseed oil cap daily.  °

## 2022-02-10 NOTE — Progress Notes (Signed)
Subjective:   By signing my name below, I, Mark Stark, attest that this documentation has been prepared under the direction and in the presence of Mark Held, DO. 02/10/2022    Patient ID: Mark Stark., male    DOB: 05-16-1928, 88 y.o.   MRN: [id number]  Chief Complaint  Patient presents with   Follow-up    Here for follow up     HPI Patient is in today for an office visit and 6 month f/u.  He had an ablation on 12/06/2021 but it only lasted for a day or two. He was diagnosed with congestive heart failure and still has atrial fibrillation. He has been told to resume his normal activities by his cardiologist.  He still sees his urologist.  He reports that his ankles still get swollen and he wears compression stockings to manage them. Happens often when he is in a stationary position for a long time.   Past Medical History:  Diagnosis Date   Anxiety    Aortic insufficiency    Atrial fibrillation (HCC)    Chronic diastolic CHF (congestive heart failure) (HCC)    Chronic kidney disease, stage 3a (HCC)    Diverticulitis    Dyslipidemia    Hypertension    Inferior mesenteric vein thrombosis (HCC)    Mitral regurgitation    Prostate cancer [facility])     Past Surgical History:  Procedure Laterality Date   CARDIOVERSION N/A 03/01/2018   Procedure: CARDIOVERSION;  Surgeon: Troy Sine, MD;  Location: [place];  Service: Cardiovascular;  Laterality: N/A;   CARDIOVERSION N/A 08/26/2021   Procedure: CARDIOVERSION;  Surgeon: Pixie Casino, MD;  Location: [place];  Service: Cardiovascular;  Laterality: N/A;   CARDIOVERSION N/A 12/06/2021   Procedure: CARDIOVERSION;  Surgeon: Donato Heinz, MD;  Location: [place];  Service: Cardiovascular;  Laterality: N/A;   CATARACT EXTRACTION  01/27/2010   right   CATARACT EXTRACTION  03/04/2010   left   Prostate seed implants     PROSTATE SURGERY      Family History  Problem Relation Age of Onset    Dementia Mother    Alzheimer's disease Mother    Mental illness Mother        ALZ DISEASE   Cancer Brother 72       pancreatic cancer    Social History   Socioeconomic History   Marital status: Widowed    Spouse name: Not on file   Number of children: Not on file   Years of education: Not on file   Highest education level: Not on file  Occupational History   Occupation: Professor---RETIRED    Employer: RETIRED    Comment: Retired  Tobacco Use   Smoking status: Never   Smokeless tobacco: Never  Vaping Use   Vaping Use: Never used  Substance and Sexual Activity   Alcohol use: No    Comment: Little wine occasionally    Drug use: No   Sexual activity: Never    Partners: Female  Other Topics Concern   Not on file  Social History Narrative   Not on file   Social Determinants of Health   Financial Resource Strain: Low Risk    Difficulty of Paying Living Expenses: Not hard at all  Food Insecurity: No Food Insecurity   Worried About Charity fundraiser in the Last Year: Never true   [place] in the Last Year: Never true  Transportation Needs: No  Transportation Needs   Lack of Transportation (Medical): No   Lack of Transportation (Non-Medical): No  Physical Activity: Sufficiently Active   Days of Exercise per Week: 6 days   Minutes of Exercise per Session: 60 min  Stress: No Stress Concern Present   Feeling of Stress : Not at all  Social Connections: Moderately Isolated   Frequency of Communication with Friends and Family: More than three times a week   Frequency of Social Gatherings with Friends and Family: More than three times a week   Attends Religious Services: 1 to 4 times per year   Active Member of [employer] or Organizations: No   Attends Archivist Meetings: Never   Marital Status: Widowed  Human resources officer Violence: Not At Risk   Fear of Current or Ex-Partner: No   Emotionally Abused: No   Physically Abused: No   Sexually Abused: No     Outpatient Medications Prior to Visit  Medication Sig Dispense Refill   b complex vitamins tablet Take 1 tablet by mouth daily.     Calcium Carbonate-Vitamin D 600-400 MG-UNIT tablet Take 1 tablet by mouth daily.     cromolyn (NASALCROM) 5.2 MG/ACT nasal spray Place 2 sprays into the nose 2 (two) times daily.     ELIQUIS 5 MG TABS tablet TAKE 1 TABLET BY MOUTH TWICE A DAY (Patient taking differently: Take 5 mg by mouth 2 (two) times daily.) 180 tablet 1   fenofibrate 160 MG tablet TAKE 1 TABLET BY MOUTH EVERY DAY 90 tablet 1   fish oil-omega-3 fatty acids 1000 MG capsule Take 1 g by mouth 3 (three) times daily.     metoprolol succinate (TOPROL XL) 25 MG 24 hr tablet Take 0.5 tablets (12.5 mg total) by mouth daily. 90 tablet 3   Multiple Vitamins-Minerals (MULTIVITAMIN PO) Take 1 tablet by mouth daily.     Multiple Vitamins-Minerals (PRESERVISION AREDS 2) CAPS 2 po qd (Patient taking differently: Take 1 tablet by mouth in the morning and at bedtime.)     potassium chloride (KLOR-CON) 10 MEQ tablet Take 1 tablet (10 mEq total) by mouth daily as needed (take when you take torsemide). 90 tablet 1   simvastatin (ZOCOR) 10 MG tablet TAKE 1 TABLET BY MOUTH EVERYDAY AT BEDTIME 90 tablet 1   tamsulosin (FLOMAX) 0.4 MG CAPS capsule Take 0.4 mg by mouth at bedtime.     torsemide (DEMADEX) 20 MG tablet Take 1 tablet (20 mg total) by mouth daily as needed (if weight up 3 pounds overnight or 5 lbs from baseline). TAKE 1 TABLET BY MOUTH EVERY DAY 90 tablet 1   vitamin C (ASCORBIC ACID) 500 MG tablet Take 500 mg by mouth daily.     clorazepate (TRANXENE) 15 MG tablet Take 1 tablet (15 mg total) by mouth at bedtime. 30 tablet 1   No facility-administered medications prior to visit.    Allergies  Allergen Reactions   Sulfonamide Derivatives Other (See Comments)    Unknown    Review of Systems  Constitutional:  Negative for fever.  HENT:  Negative for congestion, ear pain, hearing loss, sinus pain  and sore throat.   Eyes:  Negative for blurred vision and pain.  Respiratory:  Negative for cough, sputum production, shortness of breath and wheezing.   Cardiovascular:  Positive for leg swelling. Negative for chest pain and palpitations.  Gastrointestinal:  Negative for blood in stool, constipation, diarrhea, nausea and vomiting.  Genitourinary:  Negative for dysuria, frequency, hematuria and urgency.  Musculoskeletal:  Negative for back pain, falls and myalgias.  Neurological:  Negative for dizziness, sensory change, loss of consciousness, weakness and headaches.  Endo/Heme/Allergies:  Negative for environmental allergies. Does not bruise/bleed easily.  Psychiatric/Behavioral:  Negative for depression and suicidal ideas. The patient is not nervous/anxious and does not have insomnia.       Objective:    Physical Exam Vitals and nursing note reviewed.  Constitutional:      General: He is not in acute distress.    Appearance: Normal appearance. He is not ill-appearing.  HENT:     Head: Normocephalic and atraumatic.     Right Ear: Tympanic membrane, ear canal and external ear normal.     Left Ear: Tympanic membrane, ear canal and external ear normal.  Eyes:     Pupils: Pupils are equal, round, and reactive to light.  Cardiovascular:     Rate and Rhythm: Normal rate. Rhythm irregular.     Pulses: Normal pulses.     Heart sounds: No murmur heard.   No gallop.  Pulmonary:     Effort: Pulmonary effort is normal. No respiratory distress.     Breath sounds: Normal breath sounds. No wheezing or rhonchi.  Abdominal:     General: Bowel sounds are normal. There is no distension.     Palpations: Abdomen is soft.     Tenderness: There is no abdominal tenderness. There is no guarding.     Hernia: No hernia is present.  Musculoskeletal:     Cervical back: Neck supple.  Lymphadenopathy:     Cervical: No cervical adenopathy.  Skin:    General: Skin is warm and dry.  Neurological:      Mental Status: He is alert and oriented to person, place, and time.  Psychiatric:        Mood and Affect: Mood normal.        Behavior: Behavior normal.        Thought Content: Thought content normal.        Judgment: Judgment normal.    BP (!) 120/55 (BP Location: Right Arm, Patient Position: Sitting, Cuff Size: Normal)    Pulse 74    Temp 97.8 F (36.6 C) (Oral)    Resp 16    Ht 5' 5"  (1.651 m)    Wt 151 lb (68.5 kg)    SpO2 98%    BMI 25.13 kg/m  Wt Readings from Last 3 Encounters:  02/10/22 151 lb (68.5 kg)  01/03/22 147 lb 12.8 oz (67 kg)  12/06/21 142 lb (64.4 kg)    Diabetic Foot Exam - Simple   No data filed    Lab Results  Component Value Date   WBC 7.8 11/07/2021   HGB 14.3 12/06/2021   HCT 42.0 12/06/2021   PLT 152 11/07/2021   GLUCOSE 86 12/06/2021   CHOL 93 07/28/2021   TRIG 50.0 07/28/2021   HDL 49.00 07/28/2021   LDLCALC 34 07/28/2021   ALT 22 10/25/2021   AST 30 10/25/2021   NA 131 (L) 12/06/2021   K 4.4 12/06/2021   CL 96 (L) 12/06/2021   CREATININE 1.30 (H) 12/06/2021   BUN 21 12/06/2021   CO2 24 11/28/2021   TSH 2.517 10/25/2021   PSA 0.35 12/27/2009   INR 1.2 02/27/2018   HGBA1C 5.0 05/21/2012   MICROALBUR <0.7 02/07/2021    Lab Results  Component Value Date   TSH 2.517 10/25/2021   Lab Results  Component Value Date   WBC 7.8 11/07/2021  HGB 14.3 12/06/2021   HCT 42.0 12/06/2021   MCV 93 11/07/2021   PLT 152 11/07/2021   Lab Results  Component Value Date   NA 131 (L) 12/06/2021   K 4.4 12/06/2021   CO2 24 11/28/2021   GLUCOSE 86 12/06/2021   BUN 21 12/06/2021   CREATININE 1.30 (H) 12/06/2021   BILITOT 1.1 10/25/2021   ALKPHOS 34 (L) 10/25/2021   AST 30 10/25/2021   ALT 22 10/25/2021   PROT 5.6 (L) 10/25/2021   ALBUMIN 3.4 (L) 10/25/2021   CALCIUM 8.8 11/28/2021   ANIONGAP 7 10/28/2021   EGFR 46 (L) 11/28/2021   GFR 57.00 (L) 07/28/2021   Lab Results  Component Value Date   CHOL 93 07/28/2021   Lab Results   Component Value Date   HDL 49.00 07/28/2021   Lab Results  Component Value Date   LDLCALC 34 07/28/2021   Lab Results  Component Value Date   TRIG 50.0 07/28/2021   Lab Results  Component Value Date   CHOLHDL 2 07/28/2021   Lab Results  Component Value Date   HGBA1C 5.0 05/21/2012       Assessment & Plan:   Problem List Items Addressed This Visit       Unprioritized   Acute diastolic CHF (congestive heart failure) ([place])    Per cardiology      Anxiety state    con't tranxene      Relevant Medications   clorazepate (TRANXENE) 15 MG tablet   Essential hypertension    Well controlled, no changes to meds. Encouraged heart healthy diet such as the DASH diet and exercise as tolerated.       Hyperlipidemia - Primary    Encourage heart healthy diet such as MIND or DASH diet, increase exercise, avoid trans fats, simple carbohydrates and processed foods, consider a krill or fish or flaxseed oil cap daily.       Relevant Orders   CBC with Differential/Platelet   Comprehensive metabolic panel   Lipid panel   Paroxysmal atrial fibrillation [facility])    Per cardiology      Other Visit Diagnoses     Generalized anxiety disorder       Relevant Medications   clorazepate (TRANXENE) 15 MG tablet   High risk medication use       Relevant Orders   Drug Tox Monitor 1 w/Conf, Oral Fld       Meds ordered this encounter  Medications   clorazepate (TRANXENE) 15 MG tablet    Sig: Take 1 tablet (15 mg total) by mouth at bedtime.    Dispense:  30 tablet    Refill:  1    Not to exceed 4 additional fills before 02/15/2022 DX Code Needed  .    I,Mark Stark,acting as a Education administrator for [employer], DO.,have documented all relevant documentation on the behalf of Mark Held, DO,as directed by  Mark Held, DO while in the presence of Mark Stark, [place], DO., personally preformed the services described in this documentation.   All medical record entries made by the scribe were at my direction and in my presence.  I have reviewed the chart and discharge instructions (if applicable) and agree that the record reflects my personal performance and is accurate and complete. 02/10/2022

## 2022-02-10 NOTE — Assessment & Plan Note (Signed)
Well controlled, no changes to meds. Encouraged heart healthy diet such as the DASH diet and exercise as tolerated.  °

## 2022-02-13 ENCOUNTER — Other Ambulatory Visit: Payer: Self-pay

## 2022-02-13 ENCOUNTER — Ambulatory Visit (INDEPENDENT_AMBULATORY_CARE_PROVIDER_SITE_OTHER): Payer: Medicare Other | Admitting: Cardiology

## 2022-02-13 VITALS — BP 130/72 | HR 82 | Ht 65.0 in | Wt 153.6 lb

## 2022-02-13 DIAGNOSIS — I4821 Permanent atrial fibrillation: Secondary | ICD-10-CM | POA: Diagnosis not present

## 2022-02-13 DIAGNOSIS — I5032 Chronic diastolic (congestive) heart failure: Secondary | ICD-10-CM

## 2022-02-13 DIAGNOSIS — I10 Essential (primary) hypertension: Secondary | ICD-10-CM

## 2022-02-13 DIAGNOSIS — E782 Mixed hyperlipidemia: Secondary | ICD-10-CM

## 2022-02-13 DIAGNOSIS — I340 Nonrheumatic mitral (valve) insufficiency: Secondary | ICD-10-CM

## 2022-02-13 DIAGNOSIS — D6869 Other thrombophilia: Secondary | ICD-10-CM | POA: Diagnosis not present

## 2022-02-13 NOTE — Patient Instructions (Signed)

## 2022-02-13 NOTE — Progress Notes (Incomplete)
Cardiology Office Note:    Date:  02/13/2022   ID:  Mark Riles., DOB 23-May-1928, MRN [id number]  PCP:  [name], [name], DO  Cardiologist:  Buford Dresser, MD  Referring MD: [name], [name], *   CC: Follow up  History of Present Illness:    Mark Sebesta. is a 88 y.o. male with a hx of atrial fibrillation, chronic diastolic CHF, CKD stage 3a, hypertension, dyslipidemia, aortic insufficiency, mitral regurgitation, inferior mesenteric vein thrombosis, prostate cancer, and anxiety, who is seen for follow up.   Cardiovascular history: On presentation to the hospital 10/25/2021 he complained of worsening shortness of breath, LE edema, and orthopnea. CXR revealed small bilateral effusions and mild interstitial edema. He was admitted for congestive heart failure.  Today: Overall, he appears well. Typically he is not able to tell that he is not in rhythm. The only way he knows is by checking his pulse or using a monitor.  He presents a BP log since his last visit, with mostly well controlled readings on average. After exercising his blood pressure tends to be lower.   Also he has not needed to take any fluid pills. Currently he is wearing compression stockings. He notes that his swelling never goes away completely but it does improve the next morning. His weight has been consistent and stable at home.  Lately he has been staying active with yard work. He has been purposefully stopping himself after an hour of work and resting before he continues. Also he is more conscientious of staying hydrated.  Concerning his diet, he continues to stick to low-sodium foods. His most recent lab work was 02/10/22 and showed his Na at 135.  He has been sleeping well, "like a rock."   He denies any palpitations, chest pain, or shortness of breath. No lightheadedness, headaches, syncope, orthopnea, or PND.   Past Medical History:  Diagnosis Date   Anxiety    Aortic insufficiency     Atrial fibrillation (HCC)    Chronic diastolic CHF (congestive heart failure) (HCC)    Chronic kidney disease, stage 3a (HCC)    Diverticulitis    Dyslipidemia    Hypertension    Inferior mesenteric vein thrombosis (HCC)    Mitral regurgitation    Prostate cancer [facility])     Past Surgical History:  Procedure Laterality Date   CARDIOVERSION N/A 03/01/2018   Procedure: CARDIOVERSION;  Surgeon: Troy Sine, MD;  Location: [place];  Service: Cardiovascular;  Laterality: N/A;   CARDIOVERSION N/A 08/26/2021   Procedure: CARDIOVERSION;  Surgeon: Pixie Casino, MD;  Location: [place];  Service: Cardiovascular;  Laterality: N/A;   CARDIOVERSION N/A 12/06/2021   Procedure: CARDIOVERSION;  Surgeon: Donato Heinz, MD;  Location: [place];  Service: Cardiovascular;  Laterality: N/A;   CATARACT EXTRACTION  01/27/2010   right   CATARACT EXTRACTION  03/04/2010   left   Prostate seed implants     PROSTATE SURGERY      Current Medications: Current Outpatient Medications on File Prior to Visit  Medication Sig   b complex vitamins tablet Take 1 tablet by mouth daily.   Calcium Carbonate-Vitamin D 600-400 MG-UNIT tablet Take 1 tablet by mouth daily.   clorazepate (TRANXENE) 15 MG tablet Take 1 tablet (15 mg total) by mouth at bedtime.   cromolyn (NASALCROM) 5.2 MG/ACT nasal spray Place 2 sprays into the nose 2 (two) times daily.   ELIQUIS 5 MG TABS tablet TAKE 1 TABLET BY  MOUTH TWICE A DAY (Patient taking differently: Take 5 mg by mouth 2 (two) times daily.)   fenofibrate 160 MG tablet TAKE 1 TABLET BY MOUTH EVERY DAY   fish oil-omega-3 fatty acids 1000 MG capsule Take 1 g by mouth 3 (three) times daily.   metoprolol succinate (TOPROL XL) 25 MG 24 hr tablet Take 0.5 tablets (12.5 mg total) by mouth daily.   Multiple Vitamins-Minerals (MULTIVITAMIN PO) Take 1 tablet by mouth daily.   Multiple Vitamins-Minerals (PRESERVISION AREDS 2) CAPS 2 po qd (Patient taking differently:  Take 1 tablet by mouth in the morning and at bedtime.)   potassium chloride (KLOR-CON) 10 MEQ tablet Take 1 tablet (10 mEq total) by mouth daily as needed (take when you take torsemide).   simvastatin (ZOCOR) 10 MG tablet TAKE 1 TABLET BY MOUTH EVERYDAY AT BEDTIME   tamsulosin (FLOMAX) 0.4 MG CAPS capsule Take 0.4 mg by mouth at bedtime.   torsemide (DEMADEX) 20 MG tablet Take 1 tablet (20 mg total) by mouth daily as needed (if weight up 3 pounds overnight or 5 lbs from baseline). TAKE 1 TABLET BY MOUTH EVERY DAY   vitamin C (ASCORBIC ACID) 500 MG tablet Take 500 mg by mouth daily.   No current facility-administered medications on file prior to visit.     Allergies:   Sulfonamide derivatives   Social History   Tobacco Use   Smoking status: Never   Smokeless tobacco: Never  Vaping Use   Vaping Use: Never used  Substance Use Topics   Alcohol use: No    Comment: Little wine occasionally    Drug use: No    Family History: family history includes Alzheimer's disease in his mother; Cancer (age of onset: 29) in his brother; Dementia in his mother; Mental illness in his mother.  ROS:   Please see the history of present illness. (+) Bilateral LE edema All other systems are reviewed and negative.    EKGs/Labs/Other Studies Reviewed:    The following studies were reviewed today:  Echo 10/26/2021:  1. Left ventricular ejection fraction, by estimation, is 50 to 55%. The  left ventricle has normal function. The left ventricle has no regional  wall motion abnormalities. There is mild left ventricular hypertrophy.  Left ventricular diastolic parameters  are indeterminate.   2. Right ventricular systolic function is normal. The right ventricular  size is normal. There is moderately elevated pulmonary artery systolic  pressure.   3. Left atrial size was moderately dilated.   4. Posterior leaflet appears proplapsed. MR is moderate to severe, the  jet is eccentric and may underestiamte  the severity.   5. Aortic valve is moderately calcified. The aortic valve is normal in  structure. Aortic valve regurgitation is moderate. No aortic stenosis is  present.   6. The inferior vena cava is dilated in size with >50% respiratory  variability, suggesting right atrial pressure of 8 mmHg.   Comparison(s): No prior Echocardiogram.   Conclusion(s)/Recommendation(s): Patient has moderate to severe mitral  regurgitation; consider TEE if clinically indicated.   Monitor 01/21/2018: Atrial fib Rare 2 - 3 second pauses.  EKG:  EKG is personally reviewed.   02/13/2022: *** 01/03/2022: EKG was not ordered. 11/07/2021: atrial fibrillation at 66 bpm  Recent Labs: 10/25/2021: B Natriuretic Peptide 623.5; Magnesium 1.8; TSH 2.517 02/10/2022: ALT 19; BUN 23; Creat 1.30; Hemoglobin 13.0; Platelets 174; Potassium 4.7; Sodium 135   Recent Lipid Panel    Component Value Date/Time   CHOL 99 02/10/2022 1401  TRIG 43 02/10/2022 1401   HDL 51 02/10/2022 1401   CHOLHDL 1.9 02/10/2022 1401   VLDL 10.0 07/28/2021 1432   LDLCALC 36 02/10/2022 1401    Physical Exam:    VS:  BP 130/72 (BP Location: Right Arm, Patient Position: Sitting, Cuff Size: Normal)    Pulse 82    Ht 5\' 5"  (1.651 m)    Wt 153 lb 9.6 oz (69.7 kg)    BMI 25.56 kg/m     Wt Readings from Last 3 Encounters:  02/13/22 153 lb 9.6 oz (69.7 kg)  02/10/22 151 lb (68.5 kg)  01/03/22 147 lb 12.8 oz (67 kg)    GEN: Well nourished, well developed in no acute distress HEENT: Normal, moist mucous membranes NECK: No JVD CARDIAC: ***irregularly irregular rhythm, normal S1 and S2, no rubs or gallops. ***1/6 systolic murmur. VASCULAR: Radial and DP pulses 2+ bilaterally. No carotid bruits RESPIRATORY:  Clear to auscultation without rales, wheezing or rhonchi  ABDOMEN: Soft, non-tender, non-distended MUSCULOSKELETAL:  Ambulates independently SKIN: Warm and dry, ***trace LE edema bilaterally NEUROLOGIC:  Alert and oriented x 3. No focal  neuro deficits noted. PSYCHIATRIC:  Normal affect    ASSESSMENT:    No diagnosis found.  PLAN:    Chronic diastolic heart failure with recent admission Moderate to severe mitral regurgitation when volume overloaded -NYHA class 3 on admission, now NYHA class II -echo with low normal EF, moderate to severe MR at peak congestion -murmur much quieter, suspect MR now improved -has continued to lose significant weight, appears euvolemic to dry -check BMET, CBC today. If renal function is worse, will decrease diuretics. He is nervous about reaccumulating fluid, discussed continuing home weights, watching for swelling/shortness of breath, etc   Atrial fibrillation, persistent -CHA2DS2/VAS Stroke Risk Points=4  -continue apixaban 5 mg BID. Meets age criteria, monitor renal function. Weight is >60 kg -he is tolerating amiodarone. Drop to 200 mg daily today -we discussed cardioversion today, he is amenable and wants me to be the one to perform the procedure -Shared Decision Making/Informed Consent{ The risks (stroke, cardiac arrhythmias rarely resulting in the need for a temporary or permanent pacemaker, skin irritation or burns and complications associated with conscious sedation including aspiration, arrhythmia, respiratory failure and death), benefits (restoration of normal sinus rhythm) and alternatives of a direct current cardioversion were explained in detail to Mark Stark and he agrees to proceed.     Hyperlipidemia -continue simvastatin, reasonable for age -on fenofibrate as well   Hypertension -continue metoprolol -we have discussed risk of orthostasis with doxazosin  Cardiac risk counseling and prevention recommendations: -recommend heart healthy/Mediterranean diet, with whole grains, fruits, vegetable, fish, lean meats, nuts, and olive oil. Limit salt. -recommend moderate walking, 3-5 times/week for 30-50 minutes each session. Aim for at least 150 minutes.week. Goal should be pace of  3 miles/hours, or walking 1.5 miles in 30 minutes -recommend avoidance of tobacco products. Avoid excess alcohol. -ASCVD risk score: The ASCVD Risk score (Arnett DK, et al., 2019) failed to calculate for the following reasons:   The 2019 ASCVD risk score is only valid for ages 38 to 54    Plan for follow up: 6 months or sooner as needed.  Medication Adjustments/Labs and Tests Ordered: Current medicines are reviewed at length with the patient today.  Concerns regarding medicines are outlined above.   No orders of the defined types were placed in this encounter.  No orders of the defined types were placed in this encounter.  There are  no Patient Instructions on file for this visit.  I,Mathew Stumpf,acting as a Education administrator for [employer], MD.,have documented all relevant documentation on the behalf of Buford Dresser, MD,as directed by  Buford Dresser, MD while in the presence of Buford Dresser, MD.  ***   Signed, Buford Dresser, MD PhD 02/13/2022 2:13 PM    [place]

## 2022-02-16 ENCOUNTER — Ambulatory Visit: Payer: Medicare Other

## 2022-02-16 LAB — COMPREHENSIVE METABOLIC PANEL WITH GFR
AG Ratio: 2 (calc) (ref 1.0–2.5)
ALT: 19 U/L (ref 9–46)
AST: 29 U/L (ref 10–35)
Albumin: 3.8 g/dL (ref 3.6–5.1)
Alkaline phosphatase (APISO): 32 U/L — ABNORMAL LOW (ref 35–144)
BUN/Creatinine Ratio: 18 (calc) (ref 6–22)
BUN: 23 mg/dL (ref 7–25)
CO2: 26 mmol/L (ref 20–32)
Calcium: 9.2 mg/dL (ref 8.6–10.3)
Chloride: 102 mmol/L (ref 98–110)
Creat: 1.3 mg/dL — ABNORMAL HIGH (ref 0.70–1.22)
Globulin: 1.9 g/dL (ref 1.9–3.7)
Glucose, Bld: 80 mg/dL (ref 65–99)
Potassium: 4.7 mmol/L (ref 3.5–5.3)
Sodium: 135 mmol/L (ref 135–146)
Total Bilirubin: 0.9 mg/dL (ref 0.2–1.2)
Total Protein: 5.7 g/dL — ABNORMAL LOW (ref 6.1–8.1)

## 2022-02-16 LAB — CBC WITH DIFFERENTIAL/PLATELET
Absolute Monocytes: 828 cells/uL (ref 200–950)
Basophils Absolute: 60 cells/uL (ref 0–200)
Basophils Relative: 1 %
Eosinophils Absolute: 72 cells/uL (ref 15–500)
Eosinophils Relative: 1.2 %
HCT: 38.8 % (ref 38.5–50.0)
Hemoglobin: 13 g/dL — ABNORMAL LOW (ref 13.2–17.1)
Lymphs Abs: 1812 cells/uL (ref 850–3900)
MCH: 31.8 pg (ref 27.0–33.0)
MCHC: 33.5 g/dL (ref 32.0–36.0)
MCV: 94.9 fL (ref 80.0–100.0)
MPV: 10.9 fL (ref 7.5–12.5)
Monocytes Relative: 13.8 %
Neutro Abs: 3228 cells/uL (ref 1500–7800)
Neutrophils Relative %: 53.8 %
Platelets: 174 10*3/uL (ref 140–400)
RBC: 4.09 10*6/uL — ABNORMAL LOW (ref 4.20–5.80)
RDW: 13.4 % (ref 11.0–15.0)
Total Lymphocyte: 30.2 %
WBC: 6 10*3/uL (ref 3.8–10.8)

## 2022-02-16 LAB — DRUG TOX MONITOR 1 W/CONF, ORAL FLD
Alprazolam: NEGATIVE ng/mL (ref <0.50)
Amphetamines: NEGATIVE ng/mL (ref <10)
Barbiturates: NEGATIVE ng/mL (ref <10)
Benzodiazepines: POSITIVE ng/mL — AB (ref <0.50)
Buprenorphine: NEGATIVE ng/mL (ref <0.10)
Chlordiazepoxide: NEGATIVE ng/mL (ref <0.50)
Clonazepam: NEGATIVE ng/mL (ref <0.50)
Cocaine: NEGATIVE ng/mL (ref <5.0)
Diazepam: NEGATIVE ng/mL (ref <0.50)
Fentanyl: NEGATIVE ng/mL (ref <0.10)
Flunitrazepam: NEGATIVE ng/mL (ref <0.50)
Flurazepam: NEGATIVE ng/mL (ref <0.50)
Heroin Metabolite: NEGATIVE ng/mL (ref <1.0)
Lorazepam: NEGATIVE ng/mL (ref <0.50)
MARIJUANA: NEGATIVE ng/mL (ref <2.5)
MDMA: NEGATIVE ng/mL (ref <10)
Meprobamate: NEGATIVE ng/mL (ref <2.5)
Methadone: NEGATIVE ng/mL (ref <5.0)
Midazolam: NEGATIVE ng/mL (ref <0.50)
Nicotine Metabolite: NEGATIVE ng/mL (ref <5.0)
Nordiazepam: >25 ng/mL — ABNORMAL HIGH (ref <0.50)
Opiates: NEGATIVE ng/mL (ref <2.5)
Oxazepam: 1.7 ng/mL — ABNORMAL HIGH (ref <0.50)
Phencyclidine: NEGATIVE ng/mL (ref <10)
Tapentadol: NEGATIVE ng/mL (ref <5.0)
Temazepam: NEGATIVE ng/mL (ref <0.50)
Tramadol: NEGATIVE ng/mL (ref <5.0)
Triazolam: NEGATIVE ng/mL (ref <0.50)
Zolpidem: NEGATIVE ng/mL (ref <5.0)

## 2022-02-16 LAB — LIPID PANEL
Cholesterol: 99 mg/dL (ref <200)
HDL: 51 mg/dL (ref >=40)
LDL Cholesterol (Calc): 36 mg/dL (calc)
Non-HDL Cholesterol (Calc): 48 mg/dL (calc) (ref <130)
Total CHOL/HDL Ratio: 1.9 (calc) (ref <5.0)
Triglycerides: 43 mg/dL (ref <150)

## 2022-02-17 ENCOUNTER — Ambulatory Visit (INDEPENDENT_AMBULATORY_CARE_PROVIDER_SITE_OTHER): Payer: Medicare Other

## 2022-02-17 VITALS — BP 108/72 | HR 102 | Temp 98.3°F | Resp 16 | Ht 65.0 in | Wt 153.2 lb

## 2022-02-17 DIAGNOSIS — Z0000 Encounter for general adult medical examination without abnormal findings: Secondary | ICD-10-CM

## 2022-02-17 NOTE — Patient Instructions (Signed)
Mark Stark , Thank you for taking time to come for your Medicare Wellness Visit. I appreciate your ongoing commitment to your health goals. Please review the following plan we discussed and let me know if I can assist you in the future.   Screening recommendations/referrals: Colonoscopy: No longer required Recommended yearly ophthalmology/optometry visit for glaucoma screening and checkup Recommended yearly dental visit for hygiene and checkup  Vaccinations: Influenza vaccine: Up to date Pneumococcal vaccine: Up to date Tdap vaccine: Up to date Shingles vaccine: May obtain vaccine at our your local pharmacy. Covid-19: Up to date  Advanced directives: Copy in chart  Conditions/risks identified: See problem list  Next appointment: Follow up in one year for your annual wellness visit. 02/19/2023 @ 1:00.  Preventive Care 86 Years and Older, Male Preventive care refers to lifestyle choices and visits with your health care provider that can promote health and wellness. What does preventive care include? A yearly physical exam. This is also called an annual well check. Dental exams once or twice a year. Routine eye exams. Ask your health care provider how often you should have your eyes checked. Personal lifestyle choices, including: Daily care of your teeth and gums. Regular physical activity. Eating a healthy diet. Avoiding tobacco and drug use. Limiting alcohol use. Practicing safe sex. Taking low doses of aspirin every day. Taking vitamin and mineral supplements as recommended by your health care provider. What happens during an annual well check? The services and screenings done by your health care provider during your annual well check will depend on your age, overall health, lifestyle risk factors, and family history of disease. Counseling  Your health care provider may ask you questions about your: Alcohol use. Tobacco use. Drug use. Emotional well-being. Home and  relationship well-being. Sexual activity. Eating habits. History of falls. Memory and ability to understand (cognition). Work and work Statistician. Screening  You may have the following tests or measurements: Height, weight, and BMI. Blood pressure. Lipid and cholesterol levels. These may be checked every 5 years, or more frequently if you are over 55 years old. Skin check. Lung cancer screening. You may have this screening every year starting at age 23 if you have a 30-pack-year history of smoking and currently smoke or have quit within the past 15 years. Fecal occult blood test (FOBT) of the stool. You may have this test every year starting at age 57. Flexible sigmoidoscopy or colonoscopy. You may have a sigmoidoscopy every 5 years or a colonoscopy every 10 years starting at age 53. Prostate cancer screening. Recommendations will vary depending on your family history and other risks. Hepatitis C blood test. Hepatitis B blood test. Sexually transmitted disease (STD) testing. Diabetes screening. This is done by checking your blood sugar (glucose) after you have not eaten for a while (fasting). You may have this done every 1-3 years. Abdominal aortic aneurysm (AAA) screening. You may need this if you are a current or former smoker. Osteoporosis. You may be screened starting at age 77 if you are at high risk. Talk with your health care provider about your test results, treatment options, and if necessary, the need for more tests. Vaccines  Your health care provider may recommend certain vaccines, such as: Influenza vaccine. This is recommended every year. Tetanus, diphtheria, and acellular pertussis (Tdap, Td) vaccine. You may need a Td booster every 10 years. Zoster vaccine. You may need this after age 22. Pneumococcal 13-valent conjugate (PCV13) vaccine. One dose is recommended after age 59. Pneumococcal polysaccharide (  PPSV23) vaccine. One dose is recommended after age 49. Talk to your  health care provider about which screenings and vaccines you need and how often you need them. This information is not intended to replace advice given to you by your health care provider. Make sure you discuss any questions you have with your health care provider. Document Released: 01/07/2016 Document Revised: 08/30/2016 Document Reviewed: 10/12/2015 Elsevier Interactive Patient Education  2017 [employer] Prevention in the Home Falls can cause injuries. They can happen to people of all ages. There are many things you can do to make your home safe and to help prevent falls. What can I do on the outside of my home? Regularly fix the edges of walkways and driveways and fix any cracks. Remove anything that might make you trip as you walk through a door, such as a raised step or threshold. Trim any bushes or trees on the path to your home. Use bright outdoor lighting. Clear any walking paths of anything that might make someone trip, such as rocks or tools. Regularly check to see if handrails are loose or broken. Make sure that both sides of any steps have handrails. Any raised decks and porches should have guardrails on the edges. Have any leaves, snow, or ice cleared regularly. Use sand or salt on walking paths during winter. Clean up any spills in your garage right away. This includes oil or grease spills. What can I do in the bathroom? Use night lights. Install grab bars by the toilet and in the tub and shower. Do not use towel bars as grab bars. Use non-skid mats or decals in the tub or shower. If you need to sit down in the shower, use a plastic, non-slip stool. Keep the floor dry. Clean up any water that spills on the floor as soon as it happens. Remove soap buildup in the tub or shower regularly. Attach bath mats securely with double-sided non-slip rug tape. Do not have throw rugs and other things on the floor that can make you trip. What can I do in the bedroom? Use night  lights. Make sure that you have a light by your bed that is easy to reach. Do not use any sheets or blankets that are too big for your bed. They should not hang down onto the floor. Have a firm chair that has side arms. You can use this for support while you get dressed. Do not have throw rugs and other things on the floor that can make you trip. What can I do in the kitchen? Clean up any spills right away. Avoid walking on wet floors. Keep items that you use a lot in easy-to-reach places. If you need to reach something above you, use a strong step stool that has a grab bar. Keep electrical cords out of the way. Do not use floor polish or wax that makes floors slippery. If you must use wax, use non-skid floor wax. Do not have throw rugs and other things on the floor that can make you trip. What can I do with my stairs? Do not leave any items on the stairs. Make sure that there are handrails on both sides of the stairs and use them. Fix handrails that are broken or loose. Make sure that handrails are as long as the stairways. Check any carpeting to make sure that it is firmly attached to the stairs. Fix any carpet that is loose or worn. Avoid having throw rugs at the top or  bottom of the stairs. If you do have throw rugs, attach them to the floor with carpet tape. Make sure that you have a light switch at the top of the stairs and the bottom of the stairs. If you do not have them, ask someone to add them for you. What else can I do to help prevent falls? Wear shoes that: Do not have high heels. Have rubber bottoms. Are comfortable and fit you well. Are closed at the toe. Do not wear sandals. If you use a stepladder: Make sure that it is fully opened. Do not climb a closed stepladder. Make sure that both sides of the stepladder are locked into place. Ask someone to hold it for you, if possible. Clearly mark and make sure that you can see: Any grab bars or handrails. First and last  steps. Where the edge of each step is. Use tools that help you move around (mobility aids) if they are needed. These include: Canes. Walkers. Scooters. Crutches. Turn on the lights when you go into a dark area. Replace any light bulbs as soon as they burn out. Set up your furniture so you have a clear path. Avoid moving your furniture around. If any of your floors are uneven, fix them. If there are any pets around you, be aware of where they are. Review your medicines with your doctor. Some medicines can make you feel dizzy. This can increase your chance of falling. Ask your doctor what other things that you can do to help prevent falls. This information is not intended to replace advice given to you by your health care provider. Make sure you discuss any questions you have with your health care provider. Document Released: 10/07/2009 Document Revised: 05/18/2016 Document Reviewed: 01/15/2015 Elsevier Interactive Patient Education  2017 [employer].

## 2022-02-17 NOTE — Progress Notes (Signed)
Subjective:   Mark Stark. is a 88 y.o. male who presents for Medicare Annual/Subsequent preventive examination.  Review of Systems     Cardiac Risk Factors include: advanced age (>77men, >59 women);hypertension;dyslipidemia;male gender     Objective:    Today's Vitals   02/17/22 1254  BP: 108/72  Pulse: (!) 102  Resp: 16  Temp: 98.3 F (36.8 C)  TempSrc: Oral  SpO2: 98%  Weight: 153 lb 3.2 oz (69.5 kg)  Height: 5\' 5"  (1.651 m)   Body mass index is 25.49 kg/m.  Advanced Directives 02/17/2022 01/06/2022 12/06/2021 10/25/2021 08/26/2021 02/10/2021 01/19/2020  Does Patient Have a Medical Advance Directive? Yes Yes Yes No Yes Yes Yes  Type of Paramedic of [place];Living will [place];Living will Living will;Healthcare Power of [place];Living will [place];Living will [place];Living will  Does patient want to make changes to medical advance directive? - No - Patient declined - - - - No - Patient declined  Copy of [place] in Chart? Yes - validated most recent copy scanned in chart (See row information) - - - Yes - validated most recent copy scanned in chart (See row information) No - copy requested Yes - validated most recent copy scanned in chart (See row information)  Would patient like information on creating a medical advance directive? - - - No - Patient declined - - -    Current Medications (verified) Outpatient Encounter Medications as of 02/17/2022  Medication Sig   b complex vitamins tablet Take 1 tablet by mouth daily.   Calcium Carbonate-Vitamin D 600-400 MG-UNIT tablet Take 1 tablet by mouth daily.   clorazepate (TRANXENE) 15 MG tablet Take 1 tablet (15 mg total) by mouth at bedtime.   cromolyn (NASALCROM) 5.2 MG/ACT nasal spray Place 2 sprays into the nose 2 (two) times daily.   ELIQUIS 5 MG TABS tablet TAKE 1 TABLET BY MOUTH TWICE A  DAY (Patient taking differently: Take 5 mg by mouth 2 (two) times daily.)   fenofibrate 160 MG tablet TAKE 1 TABLET BY MOUTH EVERY DAY   fish oil-omega-3 fatty acids 1000 MG capsule Take 1 g by mouth 3 (three) times daily.   metoprolol succinate (TOPROL XL) 25 MG 24 hr tablet Take 0.5 tablets (12.5 mg total) by mouth daily.   Multiple Vitamins-Minerals (MULTIVITAMIN PO) Take 1 tablet by mouth daily.   Multiple Vitamins-Minerals (PRESERVISION AREDS 2) CAPS 2 po qd (Patient taking differently: Take 1 tablet by mouth in the morning and at bedtime.)   potassium chloride (KLOR-CON) 10 MEQ tablet Take 1 tablet (10 mEq total) by mouth daily as needed (take when you take torsemide).   simvastatin (ZOCOR) 10 MG tablet TAKE 1 TABLET BY MOUTH EVERYDAY AT BEDTIME   tamsulosin (FLOMAX) 0.4 MG CAPS capsule Take 0.4 mg by mouth at bedtime.   torsemide (DEMADEX) 20 MG tablet Take 1 tablet (20 mg total) by mouth daily as needed (if weight up 3 pounds overnight or 5 lbs from baseline). TAKE 1 TABLET BY MOUTH EVERY DAY   vitamin C (ASCORBIC ACID) 500 MG tablet Take 500 mg by mouth daily.   No facility-administered encounter medications on file as of 02/17/2022.    Allergies (verified) Sulfonamide derivatives   History: Past Medical History:  Diagnosis Date   Anxiety    Aortic insufficiency    Atrial fibrillation (HCC)    Chronic diastolic CHF (congestive heart  failure) ([place])    Chronic kidney disease, stage 3a ([place])    Diverticulitis    Dyslipidemia    Hypertension    Inferior mesenteric vein thrombosis (HCC)    Mitral regurgitation    Prostate cancer [facility])    Past Surgical History:  Procedure Laterality Date   CARDIOVERSION N/A 03/01/2018   Procedure: CARDIOVERSION;  Surgeon: Troy Sine, MD;  Location: [place];  Service: Cardiovascular;  Laterality: N/A;   CARDIOVERSION N/A 08/26/2021   Procedure: CARDIOVERSION;  Surgeon: Pixie Casino, MD;  Location: [facility] ENDOSCOPY;  Service:  Cardiovascular;  Laterality: N/A;   CARDIOVERSION N/A 12/06/2021   Procedure: CARDIOVERSION;  Surgeon: Donato Heinz, MD;  Location: [facility] ENDOSCOPY;  Service: Cardiovascular;  Laterality: N/A;   CATARACT EXTRACTION  01/27/2010   right   CATARACT EXTRACTION  03/04/2010   left   Prostate seed implants     PROSTATE SURGERY     Family History  Problem Relation Age of Onset   Dementia Mother    Alzheimer's disease Mother    Mental illness Mother        ALZ DISEASE   Cancer Brother 2       pancreatic cancer   Social History   Socioeconomic History   Marital status: Widowed    Spouse name: Not on file   Number of children: Not on file   Years of education: Not on file   Highest education level: Not on file  Occupational History   Occupation: Professor---RETIRED    Employer: RETIRED    Comment: Retired  Tobacco Use   Smoking status: Never   Smokeless tobacco: Never  Vaping Use   Vaping Use: Never used  Substance and Sexual Activity   Alcohol use: No    Comment: Little wine occasionally    Drug use: No   Sexual activity: Never    Partners: Female  Other Topics Concern   Not on file  Social History Narrative   Not on file   Social Determinants of Health   Financial Resource Strain: Low Risk    Difficulty of Paying Living Expenses: Not hard at all  Food Insecurity: No Food Insecurity   Worried About Charity fundraiser in the Last Year: Never true   [place] in the Last Year: Never true  Transportation Needs: No Transportation Needs   Lack of Transportation (Medical): No   Lack of Transportation (Non-Medical): No  Physical Activity: Sufficiently Active   Days of Exercise per Week: 7 days   Minutes of Exercise per Session: 40 min  Stress: No Stress Concern Present   Feeling of Stress : Not at all  Social Connections: Moderately Isolated   Frequency of Communication with Friends and Family: More than three times a week   Frequency of Social  Gatherings with Friends and Family: Twice a week   Attends Religious Services: More than 4 times per year   Active Member of [employer] or Organizations: No   Attends Archivist Meetings: Never   Marital Status: Widowed    Tobacco Counseling Counseling given: Not Answered   Clinical Intake:  Pre-visit preparation completed: Yes  Pain : No/denies pain     BMI - recorded: 25.49 Nutritional Status: BMI 25 -29 Overweight Nutritional Risks: None Diabetes: No  How often do you need to have someone help you when you read instructions, pamphlets, or other written materials from your doctor or pharmacy?: 1 - Never  Diabetic?No  Interpreter Needed?: No  Information entered by :: Dustin Flock LPN   Activities of Daily Living In your present state of health, do you have any difficulty performing the following activities: 02/17/2022 10/28/2021  Hearing? Y Y  Comment hearing aids -  Vision? N N  Difficulty concentrating or making decisions? N N  Walking or climbing stairs? N Y  Dressing or bathing? N N  Doing errands, shopping? N N  Preparing Food and eating ? N -  Using the Toilet? N -  In the past six months, have you accidently leaked urine? N -  Do you have problems with loss of bowel control? N -  Managing your Medications? N -  Managing your Finances? N -  Housekeeping or managing your Housekeeping? N -  Some recent data might be hidden    Patient Care Team: Mark Stark, [name], DO as PCP - General Buford Dresser, MD as PCP - Cardiology (Cardiology) Gerarda Fraction, MD as Referring Physician (Ophthalmology) Druscilla Brownie, MD as Consulting Physician (Dermatology) Remi Haggard, MD as Consulting Physician (Urology) Luretha Rued, RN as Case Manager Remi Haggard, MD as Consulting Physician (Urology)  Indicate any recent Medical Services you may have received from other than [facility] providers in the past year (date may be approximate).      Assessment:   This is a routine wellness examination for [place].  Hearing/Vision screen Hearing Screening - Comments:: Bilateral hearing aids Vision Screening - Comments:: Last eye exam-2023-Dr. Renford Dills  Dietary issues and exercise activities discussed: Current Exercise Habits: Home exercise routine, Type of exercise: walking, Time (Minutes): 40, Frequency (Times/Week): 7, Weekly Exercise (Minutes/Week): 280, Intensity: Mild, Exercise limited by: None identified   Goals Addressed             This Visit's Progress    Patient Stated   On track    Eat healthier & decrease salt intake       Depression Screen PHQ 2/9 Scores 02/17/2022 02/10/2021 02/07/2021 01/19/2020 01/14/2019 12/22/2016 12/21/2015  PHQ - 2 Score 0 1 0 0 0 0 1    Fall Risk Fall Risk  02/17/2022 04/05/2021 02/10/2021 02/07/2021 02/07/2021  Falls in the past year? 0 0 0 0 0  Number falls in past yr: 0 - 0 0 0  Injury with Fall? 0 - 0 0 0  Risk for fall due to : - - - - -  Follow up Falls prevention discussed - Falls prevention discussed - Falls evaluation completed    FALL RISK PREVENTION PERTAINING TO THE HOME:  Any stairs in or around the home? Yes  If so, are there any without handrails? Yes pt aware of falls risk Home free of loose throw rugs in walkways, pet beds, electrical cords, etc? Yes  Adequate lighting in your home to reduce risk of falls? Yes   ASSISTIVE DEVICES UTILIZED TO PREVENT FALLS:  Life alert? No  Use of a cane, walker or w/c? No  Grab bars in the bathroom? Yes  Shower chair or bench in shower? No  Elevated toilet seat or a handicapped toilet? No   TIMED UP AND GO:  Was the test performed? Yes .  Length of time to ambulate 10 feet: 12 sec.   Gait slow and steady without use of assistive device  Cognitive Function:Normal cognitive status assessed by direct observation by this Nurse Health Advisor. No abnormalities found.   MMSE - Mini Mental State Exam 01/14/2019 12/22/2016  Orientation  to time 5 5  Orientation to Place  5 5  Registration 3 3  Attention/ Calculation 5 5  Recall 3 3  Language- name 2 objects 2 2  Language- repeat 1 1  Language- follow 3 step command 3 3  Language- read & follow direction 1 1  Write a sentence 1 1  Copy design 1 1  Total score 30 30        Immunizations Immunization History  Administered Date(s) Administered   Influenza Whole 11/14/2007, 09/30/2008, 09/08/2009, 08/31/2010, 09/03/2013   Influenza, High Dose Seasonal PF 08/19/2015, 09/07/2017, 08/05/2019   Influenza-Unspecified 09/07/2014, 09/15/2016, 08/03/2020   PFIZER Comirnaty(Gray Top)Covid-19 Tri-Sucrose Vaccine 04/11/2021   PFIZER(Purple Top)SARS-COV-2 Vaccination 01/18/2020, 02/09/2020, 09/14/2020   Pfizer Covid-19 Vaccine Bivalent Booster 63yrs & up 10/04/2021   Pneumococcal Conjugate-13 09/07/2014   Pneumococcal Polysaccharide-23 09/22/2009   Tdap 12/25/2012   Zoster, Live 03/19/2008    TDAP status: Up to date  Flu Vaccine status: Up to date  Pneumococcal vaccine status: Up to date  Covid-19 vaccine status: Completed vaccines  Qualifies for Shingles Vaccine? Yes   Zostavax completed Yes   Shingrix Completed?: No.    Education has been provided regarding the importance of this vaccine. Patient has been advised to call insurance company to determine out of pocket expense if they have not yet received this vaccine. Advised may also receive vaccine at local pharmacy or Health Dept. Verbalized acceptance and understanding.  Screening Tests Health Maintenance  Topic Date Due   Zoster Vaccines- Shingrix (1 of 2) Never done   TETANUS/TDAP  12/25/2022   Pneumonia Vaccine 61+ Years old  Completed   INFLUENZA VACCINE  Completed   COVID-19 Vaccine  Completed   HPV VACCINES  Aged Out    Health Maintenance  Health Maintenance Due  Topic Date Due   Zoster Vaccines- Shingrix (1 of 2) Never done    Colorectal cancer screening: No longer required.   Lung Cancer  Screening: (Low Dose CT Chest recommended if Age 79-80 years, 30 pack-year currently smoking OR have quit w/in 15years.) does not qualify.     Additional Screening:  Hepatitis C Screening: does not qualify  Vision Screening: Recommended annual ophthalmology exams for early detection of glaucoma and other disorders of the eye. Is the patient up to date with their annual eye exam?  Yes  Who is the provider or what is the name of the office in which the patient attends annual eye exams? Dr. Renford Dills   Dental Screening: Recommended annual dental exams for proper oral hygiene  Community Resource Referral / Chronic Care Management: CRR required this visit?  No   CCM required this visit?  No      Plan:     I have personally reviewed and noted the following in the patients chart:   Medical and social history Use of alcohol, tobacco or illicit drugs  Current medications and supplements including opioid prescriptions. Patient is not currently taking opioid prescriptions. Functional ability and status Nutritional status Physical activity Advanced directives List of other physicians Hospitalizations, surgeries, and ER visits in previous 12 months Vitals Screenings to include cognitive, depression, and falls Referrals and appointments  In addition, I have reviewed and discussed with patient certain preventive protocols, quality metrics, and best practice recommendations. A written personalized care plan for preventive services as well as general preventive health recommendations were provided to patient.     Marta Antu, LPN   3/50/0938  Nurse Health Advisor  Nurse Notes: None

## 2022-03-03 ENCOUNTER — Telehealth: Payer: Medicare Other

## 2022-03-06 ENCOUNTER — Other Ambulatory Visit: Payer: Self-pay | Admitting: Family Medicine

## 2022-03-10 ENCOUNTER — Telehealth: Payer: Medicare Other

## 2022-03-13 ENCOUNTER — Ambulatory Visit (INDEPENDENT_AMBULATORY_CARE_PROVIDER_SITE_OTHER): Payer: Medicare Other

## 2022-03-13 ENCOUNTER — Telehealth: Payer: Self-pay | Admitting: Cardiology

## 2022-03-13 DIAGNOSIS — I10 Essential (primary) hypertension: Secondary | ICD-10-CM

## 2022-03-13 DIAGNOSIS — I5032 Chronic diastolic (congestive) heart failure: Secondary | ICD-10-CM

## 2022-03-13 DIAGNOSIS — I480 Paroxysmal atrial fibrillation: Secondary | ICD-10-CM

## 2022-03-13 MED ORDER — METOPROLOL SUCCINATE ER 25 MG PO TB24: 12.5000 mg | ORAL_TABLET | Freq: Every day | ORAL | 3 refills | Status: DC

## 2022-03-13 NOTE — Patient Instructions (Signed)
Visit Information ? ?Thank you for taking time to visit with me today. Please don't hesitate to contact me if I can be of assistance to you before our next scheduled telephone appointment. ? ?Following are the goals we discussed today:  ?Patient Goals/Self-Care Activities: ?Please contact your cardiologists and/or your primary care provider regarding blood pressure medications/blood pressure readings/concerns today.  ?Take medications as prescribed   ?Attend all scheduled provider appointments ?Continue to check blood pressure and heart rate(pulse) as recommended by your provider and notify provider if out of recommended range. ?Call your cardiologist or primary care provider: signs/symptoms of worsening heart failure: weight gain of 3 pounds in a 24 hour period or 5 pounds in a week, increased swelling of stomach, feet/legs or hands. Increased shortness of breath, having less energy than normal, new or worsening dizziness, if it is harder for you to breathe when lying down and you need to sit up to breathe or you feel that something is not right. ?Continue to remain active per provider recommendations ?Eat Healthy: whole grains, lean meats, fruits, vegetable, healthy fats, monitor salt intake ?Contact your RN Care Manager as needed for care coordination and/or care management needs ? ?Our next appointment is by telephone on 04/10/22 at 10:45 am ? ?Please call the care guide team at [phone] if you need to cancel or reschedule your appointment.  ? ?If you are experiencing a Mental Health or [place] or need someone to talk to, please call the Suicide and Crisis Lifeline: 988 ?call 1-800-273-TALK (toll free, 24 hour hotline)  ? ?Patient verbalizes understanding of instructions and care plan provided today and agrees to view in [place]. Active MyChart status confirmed with patient.   ? ?Thea Silversmith, RN, MSN, BSN, CCM ?Care Management Coordinator ?[facility] ?[phone]  ?

## 2022-03-13 NOTE — Telephone Encounter (Signed)
Patient was previously advised to cut his Toprol in half to 12.5 mg to see if his blood pressure would come back up, patient calling back to say that his BP has been staying that his Blood pressures have continued to stay below the 140/90 goal that he and Dr. Harrell Gave have set.  ? ?Patient advised to continue taking his 12.'5mg'$   ? ?

## 2022-03-13 NOTE — Chronic Care Management (AMB) (Signed)
?Chronic Care Management  ? ?CCM RN Visit Note ? ?03/13/2022 ?Name: Mark Stark. MRN: [id number] DOB: [birth year] ? ?Subjective: ?Mark Stark. is a 88 y.o. year old male who is a primary care patient of [name], DO. The care management team was consulted for assistance with disease management and care coordination needs.   ? ?Engaged with patient by telephone for follow up visit in response to provider referral for case management and/or care coordination services.  ? ?Consent to Services:  ?The patient was given information about Chronic Care Management services, agreed to services, and gave verbal consent prior to initiation of services.  Please see initial visit note for detailed documentation.  ? ?Patient agreed to services and verbal consent obtained.  ? ?Assessment: Review of patient past medical history, allergies, medications, health status, including review of consultants reports, laboratory and other test data, was performed as part of comprehensive evaluation and provision of chronic care management services.  ? ?SDOH (Social Determinants of Health) assessments and interventions performed:   ? ?CCM Care Plan ? ?Allergies  ?Allergen Reactions  ? Sulfonamide Derivatives Other (See Comments)  ?  Unknown  ? ? ?Outpatient Encounter Medications as of 03/13/2022  ?Medication Sig Note  ? b complex vitamins tablet Take 1 tablet by mouth daily.   ? Calcium Carbonate-Vitamin D 600-400 MG-UNIT tablet Take 1 tablet by mouth daily.   ? clorazepate (TRANXENE) 15 MG tablet Take 1 tablet (15 mg total) by mouth at bedtime.   ? cromolyn (NASALCROM) 5.2 MG/ACT nasal spray Place 2 sprays into the nose 2 (two) times daily.   ? ELIQUIS 5 MG TABS tablet TAKE 1 TABLET BY MOUTH TWICE A DAY (Patient taking differently: Take 5 mg by mouth 2 (two) times daily.)   ? fenofibrate 160 MG tablet TAKE 1 TABLET BY MOUTH EVERY DAY   ? fish oil-omega-3 fatty acids 1000 MG capsule Take 1 g by mouth 3 (three) times daily.  11/08/2021: Reports takes twice a day.  ? metoprolol succinate (TOPROL XL) 25 MG 24 hr tablet Take 0.5 tablets (12.5 mg total) by mouth daily. 03/13/2022: Reports take 25 mg once a day  ? Multiple Vitamins-Minerals (MULTIVITAMIN PO) Take 1 tablet by mouth daily.   ? Multiple Vitamins-Minerals (PRESERVISION AREDS 2) CAPS 2 po qd (Patient taking differently: Take 1 tablet by mouth in the morning and at bedtime.)   ? potassium chloride (KLOR-CON) 10 MEQ tablet Take 1 tablet (10 mEq total) by mouth daily as needed (take when you take torsemide).   ? simvastatin (ZOCOR) 10 MG tablet TAKE 1 TABLET BY MOUTH EVERYDAY AT BEDTIME   ? tamsulosin (FLOMAX) 0.4 MG CAPS capsule Take 0.4 mg by mouth at bedtime.   ? torsemide (DEMADEX) 20 MG tablet Take 1 tablet (20 mg total) by mouth daily as needed (if weight up 3 pounds overnight or 5 lbs from baseline). TAKE 1 TABLET BY MOUTH EVERY DAY   ? vitamin C (ASCORBIC ACID) 500 MG tablet Take 500 mg by mouth daily.   ? ?No facility-administered encounter medications on file as of 03/13/2022.  ? ? ?Patient Active Problem List  ? Diagnosis Date Noted  ? Chronic diastolic CHF (congestive heart failure) ([place]) 11/09/2021  ? Nonrheumatic mitral valve regurgitation 10/28/2021  ? Acute kidney injury superimposed on CKD ([place]) 10/28/2021  ? Acute diastolic CHF (congestive heart failure) ([place]) 10/25/2021  ? Secondary hypercoagulable state ([place]) 08/23/2021  ? Prostate CA ([place]) 07/28/2021  ? Thrombocytopenia ([place])  03/30/2021  ? Screening for HIV (human immunodeficiency virus) 03/30/2021  ? Monocytosis 03/30/2021  ? Abnormal platelets ([place]) 03/28/2021  ? Hip pain 03/28/2021  ? Persistent atrial fibrillation ([place])   ? Paroxysmal atrial fibrillation ([place]) 01/12/2018  ? Cystoid macular edema of right eye 11/29/2017  ? Macular degeneration 12/21/2015  ? 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 eye with active choroidal neovascularization ([place]) 04/30/2014  ? Myalgia and myositis 11/16/2013  ?  Epiretinal membrane 11/07/2012  ? Posterior vitreous detachment 11/07/2012  ? Status post intraocular lens implant 11/07/2012  ? Fungal ear infection 04/09/2012  ? Abnormal laboratory test 03/27/2011  ? Aortic insufficiency 03/27/2011  ? SYSTOLIC MURMUR 23/76/2831  ? EDEMA 12/05/2010  ? ECCHYMOSES, SPONTANEOUS 11/28/2010  ? CONTUSION, RIGHT THIGH 11/28/2010  ? DECREASED HEARING, BILATERAL 09/23/2010  ? Pain in limb 07/28/2009  ? ANKLE SPRAIN, LEFT 07/28/2009  ? CONTUSION, LOWER LEG, LEFT 07/28/2009  ? DERMATITIS, CONTACT, NOS 10/02/2007  ? Essential hypertension 05/07/2007  ? PROSTATE CANCER, HX OF 03/13/2007  ? Hyperlipidemia 01/15/2007  ? Anxiety state 01/15/2007  ? DIVERTICULITIS, HX OF 01/15/2007  ? ? ?Conditions to be addressed/monitored:Atrial Fibrillation, CHF, HTN, and HLD ? ?Care Plan : RN Care Manager Plan of Care  ?Updates made by Luretha Rued, RN since 03/13/2022 12:00 AM  ?  ? ?Problem: Chronic disease Managment Education and/or Care Corodination needs (CHF/Atrial Fibrillation)   ?Priority: High  ?  ? ?Long-Range Goal: Development of Plan of Care for Chronic Disease Management (CHF/Atrial Fibrillation)   ?Start Date: 11/08/2021  ?Expected End Date: 09/13/2022  ?Priority: High  ?Note:   ?Current Barriers:  Reports he went for usual walk this morning and noticed he was feeling week/tired and recognized this as one of his signs for low blood pressure. He states he checked his BP medication bottle and that his pharmacy automatically refilled an old Metoprolol prescription for 200 mg tablets. He states he took ? tablet last night for a total of 156m. States this morning blood pressure at 8:30 am was 99/76. Denies dizziness, "I just feel worn out and weak?.  Per chart dose is 231m? tab (12.13m79motal dose) daily. [name] he has been taking the whole 213m32mblet dose because ?the 12.13mg 86me did not seem to have any effect?. He reports the latest blood pressure on 213mg 64m122/83; 124/84 and 121/91.  BP at 11:05 am today 95/67. Denies dizziness, just feeling tired. Denies any other issues or concerns. ?Knowledge Deficits related to plan of care for management of Atrial Fibrillation and CHF  ?Chronic Disease Management support and education needs related to Atrial Fibrillation and CHF ? ?RNCM Clinical Goal(s):  ?Patient will verbalize understanding of plan for management of Atrial Fibrillation and CHF as evidenced by taking medications as prescribed, attending provider visit as scheduled, notifying provider with signs/symptoms of exacerbation. ?verbalize basic understanding of Atrial Fibrillation and CHF disease process and self health management plan as evidenced by self report of managment ?take all medications exactly as prescribed and will call provider for medication related questions as evidenced by .    through collaboration with RN Care manager, provider, and care team.  ?Maintain and/or improved quality of life-the patient will demonstrate ongoing self health care management ability as evidenced by attending provider visits, taking medications as prescribed, continuing activities that encourage fulfullment in his life such as walking, gardening, outside activites. ? ?Interventions: ?1:1 collaboration with primary care provider regarding development and update of comprehensive plan of care as  evidenced by provider attestation and co-signature ?Inter-disciplinary care team collaboration (see longitudinal plan of care) ?Evaluation of current treatment plan related to  self management and patient's adherence to plan as established by provider ?Provided Calender/Organizer which includes action plan for CHF and Atrial fibrillation ? ?Hypertension Interventions:  (Status:  New goal.) Long Term Goal ?03/13/22 BP 99/76 at 8:30 am ?03/13/22 BP 95/67 at 11:05 am ?Last practice recorded BP readings:  ?BP Readings from Last 3 Encounters:  ?02/17/22 108/72  ?02/13/22 130/72  ?02/10/22 (!) 120/55  ?Most recent eGFR/CrCl:   ?Lab Results  ?Component Value Date  ? EGFR 46 (L) 11/28/2021  ?  No components found for: CRCL ? ?Reviewed medications with patient and discussed importance of compliance ?Advised patient to contact cardiolo

## 2022-03-13 NOTE — Telephone Encounter (Signed)
Pt c/o medication issue: ? ?1. Name of Medication:  ?metoprolol succinate (TOPROL XL) 25 MG 24 hr tablet ? ?2. How are you currently taking this medication (dosage and times per day)?  ? ?3. Are you having a reaction (difficulty breathing--STAT)?  ? ?4. What is your medication issue?  ? ?Patient would like to clarify medication instructions and how he needs to be taking it. BP has been ranging in the 140/90's and he has little to no energy. ? ?

## 2022-03-14 ENCOUNTER — Ambulatory Visit: Payer: Medicare Other | Admitting: Pharmacist

## 2022-03-14 DIAGNOSIS — I480 Paroxysmal atrial fibrillation: Secondary | ICD-10-CM

## 2022-03-14 DIAGNOSIS — E785 Hyperlipidemia, unspecified: Secondary | ICD-10-CM

## 2022-03-14 DIAGNOSIS — I5032 Chronic diastolic (congestive) heart failure: Secondary | ICD-10-CM

## 2022-03-14 DIAGNOSIS — I10 Essential (primary) hypertension: Secondary | ICD-10-CM

## 2022-03-14 NOTE — Patient Instructions (Signed)
Mark Stark,  ?It was a pleasure speaking with you today.  ?I have attached a summary of our visit today and information about your health goals. (See Below)  ? ? ?Patient Goals/Self-Care Activities ?Over the next 180 days, patient will:  ?take medications as prescribed,  ?focus on medication adherence by using pill containers for medications ?check blood pressure 2 to 3 times per week, document, and provide at future appointments ?weigh daily, and contact provider if weight gain of more than 3 lbs in 24 hours or 5lbs in 1 week ?target a minimum of 150 minutes of moderate intensity exercise weekly ?Please feel free to contact clinical pharmacist at [facility] Antony Contras [phone]) or contact her pharmacy if you notice any changes in your medications - size, color or shape.  ? ? ?If you have any questions or concerns, please feel free to contact me either at the phone number below or with a MyChart message.  ? ?Keep up the good work! ? ?Cherre Robins, PharmD ?Clinical Pharmacist ?[facility] ?[place] ?[phone] (direct line)  ?[phone] (main office number) ? ? ?Chronic Care Management Care Plan ? ?Hypertension / Congestive heart failure: ?Controlled; blood pressure goal < 140/90 ?Heart Failure goal: limit symptoms (Swelling / shortness of breath) and prevent hospitalizations related to heart failure.  ? ?BP Readings from Last 3 Encounters:  ?02/17/22 108/72  ?02/13/22 130/72  ?02/10/22 (!) 120/55  ? ?Current treatment: ? Metoprolol succinate '25mg'$  - take 0.'5mg'$  = 12.'5mg'$  daily ?Torsemide '20mg'$  - take 1 tablet as needed ?Potassium 6mq - take 1 tablet as needed when taking torsemide.   ?Interventions:  ?Coordinated with patient's pharmacy to make sure that there we no active prescriptions for metoprolol succinate '200mg'$  on file.  ?[facility] if he notices any changes in his medications -  size, color or shape  ?Continue to take metoprolol succinate 12.'5mg'$  daily ?Continue to check blood pressure daily ?Continue to check weight daily ? ?Hyperlipidemia: ?Controlled; LDL goal <100 HDLC >40 and Tg < 150 ? ?Lab Results  ?Component Value Date  ? HDL 51 02/10/2022  ? HDL 49.00 07/28/2021  ? HDL 44.80 02/07/2021  ? ?Lab Results  ?Component Value Date  ? L[place]36 02/10/2022  ? L[place]34 07/28/2021  ? LDLCALC 39 02/07/2021  ? ?Lab Results  ?Component Value Date  ? TRIG 43 02/10/2022  ? TRIG 50.0 07/28/2021  ? TRIG 69.0 02/07/2021  ? ?Current treatment: ?Fenofibrate '160mg'$  daily ?Simvastatin '10mg'$  each evening / bedtime ?Fish Oil 1 gram twice a day ?Interventions:  ?Discuss lipid goals ?Continue current therapy for lipids ? ? ?Atrial Fibrillation: ?Controlled; Goal: prevent stroke and maintain heart rate in normal sinus rhythm.  ?Current rate/rhythm control: metoprolol succinate ER '25mg'$  - take 0.5 tablet = 12.'5mg'$  daily ?anticoagulant treatment: Eliquis '5mg'$  twice a day ?Interventions:  ?Continue to take metoprolol at current dose ?Continue Eliquis '5mg'$  twice a day ? ?Medication management ?Pharmacist Clinical Goal(s): ?Over the next 90 days, patient will work with PharmD and providers to maintain optimal medication adherence ?Current pharmacy: [facility] ?Interventions ?Comprehensive medication review performed. ?Reviewed refill history and assessed adherence ?Continue current medication management strategy ? ?Patient Goals/Self-Care Activities ?take medications as prescribed,  ?focus on medication adherence by using pill containers for medications ?check blood pressure 2 to 3 times per week, document, and provide at future appointments ?weigh daily, and contact provider if weight gain of more than  3 lbs in 24 hours or 5lbs in 1 week ?target a minimum of 150 minutes of moderate intensity exercise weekly ?Please feel free to contact clinical pharmacist at [facility] Antony Contras [phone]) or contact her  pharmacy if you notice any changes in your medications - size, color or shape.  ? ?Follow Up Plan: Telephone follow up appointment with care management team member scheduled for:  Nurse Navigator for 1 month Clinical pharmacist will follow up in 4 to 6 months unless needs identified sooner. ? ?The patient verbalized understanding of instructions, educational materials, and care plan provided today and agreed to receive a mailed copy of patient instructions, educational materials, and care plan.   ?

## 2022-03-14 NOTE — Chronic Care Management (AMB) (Signed)
? ? ?Chronic Care Management ?Pharmacy Note ? ?03/14/2022 ?Name:  Mark Stark. MRN:  [id number] DOB:  05/21/1928 ? ?Summary: ?Patient was referred by Chronic Care Management nurse case manager after he took metoprolol ER 273m 0.5 tablet instead of 232m0.5 tablet yesterday. Coordinated with pharmacy to make sure that all prescriptions for 20055mose were inactivated. Patient advised to contact clinical pharmacist or his pharmacy anytime that he notices changes in medications color, shape or size to verify correct medication.  ?Reviewed dose for Eliquis - recommended continue 5mg33mice a day ? ?Subjective: ?Mark Stark an 89 y27. year old male who is a primary patient of LownAnn Held.  The CCM team was consulted for assistance with disease management and care coordination needs.   ? ?Engaged with patient by telephone for initial visit in response to provider referral for pharmacy case management and/or care coordination services.  ? ?Consent to Services:  ?The patient was given the following information about Chronic Care Management services today, agreed to services, and gave verbal consent: 1. CCM service includes personalized support from designated clinical staff supervised by the primary care provider, including individualized plan of care and coordination with other care providers 2. 24/7 contact phone numbers for assistance for urgent and routine care needs. 3. Service will only be billed when office clinical staff spend 20 minutes or more in a month to coordinate care. 4. Only one practitioner may furnish and bill the service in a calendar month. 5.The patient may stop CCM services at any time (effective at the end of the month) by phone call to the office staff. 6. The patient will be responsible for cost sharing (co-pay) of up to 20% of the service fee (after annual deductible is met). Patient agreed to services and consent obtained. ? ?Patient Care Team: ?LownCarollee HerteronAlferd Apa as  PCP - General ?ChriBuford Dresser as PCP - Cardiology (Cardiology) ?GrevGerarda Fraction as Referring Physician (Ophthalmology) ?LuptDruscilla Brownie as Consulting Physician (Dermatology) ?NewsRemi Haggard as Consulting Physician (Urology) ?WallLuretha Rued as Case Manager ?NewsRemi Haggard as Consulting Physician (Urology) ? ? ?Objective: ? ?Lab Results  ?Component Value Date  ? CREATININE 1.30 (H) 02/10/2022  ? CREATININE 1.30 (H) 12/06/2021  ? CREATININE 1.43 (H) 11/28/2021  ? ? ?Lab Results  ?Component Value Date  ? HGBA1C 5.0 05/21/2012  ? ?Last diabetic Eye exam: No results found for: HMDIABEYEEXA  ?Last diabetic Foot exam: No results found for: HMDIABFOOTEX  ? ?   ?Component Value Date/Time  ? CHOL 99 02/10/2022 1401  ? TRIG 43 02/10/2022 1401  ? HDL 51 02/10/2022 1401  ? CHOLHDL 1.9 02/10/2022 1401  ? VLDL 10.0 07/28/2021 1432  ? LDLCALC 36 02/10/2022 1401  ? ? ?Hepatic Function Latest Ref Rng & Units 02/10/2022 10/25/2021 07/28/2021  ?Total Protein 6.1 - 8.1 g/dL 5.7(L) 5.6(L) 6.0  ?Albumin 3.5 - 5.0 g/dL - 3.4(L) 4.1  ?AST 10 - 35 U/L 29 30 37  ?ALT 9 - 46 U/L _0 ?Alk Phosphatase 38 - 126 U/L - 34(L) 30(L)  ?Total Bilirubin 0.2 - 1.2 mg/dL 0.9 1.1 1.0  ?Bilirubin, Direct 0.0 - 0.3 mg/dL - - -  ? ? ?Lab Results  ?Component Value Date/Time  ? TSH 2.517 10/25/2021 06:30 PM  ? TSH 1.830 01/25/2018 09:53 AM  ? ? ?CBC Latest Ref Rng & Units 02/10/2022 12/06/2021 11/07/2021  ?WBC 3.8 - 10.8  Thousand/uL 6.0 - 7.8  ?Hemoglobin 13.2 - 17.1 g/dL 13.0(L) 14.3 14.8  ?Hematocrit 38.5 - 50.0 % 38.8 42.0 44.3  ?Platelets 140 - 400 Thousand/uL 174 - 152  ? ? ?No results found for: VD25OH ? ?Clinical ASCVD: No  ?The ASCVD Risk score (Arnett DK, et al., 2019) failed to calculate for the following reasons: ?  The 2019 ASCVD risk score is only valid for ages 67 to 75   ? ?Other: CHADS2VASc = 4 ? ?Social History  ? ?Tobacco Use  ?Smoking Status Never  ?Smokeless Tobacco Never  ? ?BP Readings from Last 3  Encounters:  ?02/17/22 108/72  ?02/13/22 130/72  ?02/10/22 (!) 120/55  ? ?Pulse Readings from Last 3 Encounters:  ?02/17/22 (!) 102  ?02/13/22 82  ?02/10/22 74  ? ?Wt Readings from Last 3 Encounters:  ?02/17/22 153 lb 3.2 oz (69.5 kg)  ?02/13/22 153 lb 9.6 oz (69.7 kg)  ?02/10/22 151 lb (68.5 kg)  ? ? ?Assessment: Review of patient past medical history, allergies, medications, health status, including review of consultants reports, laboratory and other test data, was performed as part of comprehensive evaluation and provision of chronic care management services.  ? ?SDOH:  (Social Determinants of Health) assessments and interventions performed:  ? ? ?CCM Care Plan ? ?Allergies  ?Allergen Reactions  ? Sulfonamide Derivatives Other (See Comments)  ?  Unknown - patient cannot remember reaction  ? ? ?Medications Reviewed Today   ? ? Reviewed by Cherre Robins, RPH-CPP (Pharmacist) on 03/14/22 at 1206  Med List Status: <None>  ? ?Medication Order Taking? Sig Documenting Provider Last Dose Status Informant  ?b complex vitamins tablet [id number] Yes Take 1 tablet by mouth daily. [provider] Taking Active Self  ?Calcium Carbonate-Vitamin D 600-400 MG-UNIT tablet [id number] Yes Take 1 tablet by mouth daily. [provider] Taking Active Self  ?clorazepate (TRANXENE) 15 MG tablet [id number] Yes Take 1 tablet (15 mg total) by mouth at bedtime. [name], DO Taking Active   ?cromolyn (NASALCROM) 5.2 MG/ACT nasal spray [id number] Yes Place 2 sprays into the nose 2 (two) times daily. [provider] Taking Active Self  ?ELIQUIS 5 MG TABS tablet [id number] Yes TAKE 1 TABLET BY MOUTH TWICE A DAY [name], DO Taking Active   ?fenofibrate 160 MG tablet [id number] Yes TAKE 1 TABLET BY MOUTH EVERY DAY [name], DO Taking Active   ?fish oil-omega-3 fatty acids 1000 MG capsule [id number] Yes Take 1 g by mouth 3 (three) times daily. [provider] Taking Active Self  ?          ?Med Note Luretha Rued   Tue Nov 08, 2021 11:52 AM) Reports takes twice a day.  ?metoprolol succinate (TOPROL XL) 25 MG 24 hr tablet [id number] Yes Take 0.5 tablets (12.5 mg total) by mouth daily. Buford Dresser, MD Taking Active   ?Multiple Vitamins-Minerals (MULTIVITAMIN PO) [id number]  Take 1 tablet by mouth daily. [provider]  Active Self  ?Multiple Vitamins-Minerals (PRESERVISION AREDS 2) CAPS [id number] Yes 2 po qd  ?Patient taking differently: Take 1 tablet by mouth in the morning and at bedtime.  ? [name], DO Taking Active   ?potassium chloride (KLOR-CON) 10 MEQ tablet [id number] Yes Take 1 tablet (10 mEq total) by mouth daily as needed (take when you take torsemide). Buford Dresser, MD Taking Active   ?simvastatin (ZOCOR) 10 MG tablet [id number] Yes TAKE 1 TABLET BY MOUTH EVERYDAY AT BEDTIME Harrell Gave,  Bridgette, MD Taking Active   ?tamsulosin (FLOMAX) 0.4 MG CAPS capsule [id number] Yes Take 0.4 mg by mouth at bedtime. [provider] Taking Active   ?torsemide (DEMADEX) 20 MG tablet [id number] Yes Take 1 tablet (20 mg total) by mouth daily as needed (if weight up 3 pounds overnight or 5 lbs from baseline). TAKE 1 TABLET BY MOUTH EVERY DAY Buford Dresser, MD Taking Active   ?vitamin C (ASCORBIC ACID) 500 MG tablet [id number] Yes Take 500 mg by mouth daily. [provider] Taking Active Self  ? ?  ?  ? ?  ? ? ?Patient Active Problem List  ? Diagnosis Date Noted  ? Chronic diastolic CHF (congestive heart failure) ([place]) 11/09/2021  ? Nonrheumatic mitral valve regurgitation 10/28/2021  ? Acute kidney injury superimposed on CKD ([place]) 10/28/2021  ? Acute diastolic CHF (congestive heart failure) ([place]) 10/25/2021  ? Secondary hypercoagulable state ([place]) 08/23/2021  ? Prostate CA ([place]) 07/28/2021  ? Thrombocytopenia ([place]) 03/30/2021  ? Screening for HIV (human immunodeficiency virus) 03/30/2021  ? Monocytosis 03/30/2021  ? Abnormal platelets  ([place]) 03/28/2021  ? Hip pain 03/28/2021  ? Persistent atrial fibrillation ([place])   ? Paroxysmal atrial fibrillation ([place]) 01/12/2018  ? Cystoid macular edema of right eye 11/29/2017  ? Macular degeneration

## 2022-03-22 ENCOUNTER — Ambulatory Visit: Payer: Medicare Other

## 2022-03-22 DIAGNOSIS — I10 Essential (primary) hypertension: Secondary | ICD-10-CM

## 2022-03-22 DIAGNOSIS — I5032 Chronic diastolic (congestive) heart failure: Secondary | ICD-10-CM

## 2022-03-22 NOTE — Patient Instructions (Signed)
Visit Information ? ?Thank you for taking time to visit with me today. Please don't hesitate to contact me if I can be of assistance to you before our next scheduled telephone appointment. ? ?Following are the goals we discussed today:  ?Patient Goals/Self-Care Activities: ?Discuss questions/concerns regarding medication Clorazepate or any other medications with provider ?Do not start or stop any medications without provider instruction ?Take medications as prescribed   ?Attend all scheduled provider appointments ?Continue to check blood pressure and heart rate(pulse) as recommended by your provider and notify provider if out of recommended range. ?Call your cardiologist or primary care provider: signs/symptoms of worsening heart failure: weight gain of 3 pounds in a 24 hour period or 5 pounds in a week, increased swelling of stomach, feet/legs or hands. Increased shortness of breath, having less energy than normal, new or worsening dizziness, if it is harder for you to breathe when lying down and you need to sit up to breathe or you feel that something is not right. ?Continue to remain active per provider recommendations ?Eat Healthy: whole grains, lean meats, fruits, vegetable, healthy fats, monitor salt intake ?Contact your RN Care Manager as needed for care coordination and/or care management needs ? ?Our next appointment is by telephone on 04/10/22 at 10:45 am ? ?Please call the care guide team at [phone] if you need to cancel or reschedule your appointment.  ? ?If you are experiencing a Mental Health or [place] or need someone to talk to, please call the Suicide and Crisis Lifeline: 988 ?call 1-800-273-TALK (toll free, 24 hour hotline)  ? ?The patient verbalized understanding of instructions, educational materials, and care plan provided today and agreed to receive a mailed copy of patient instructions, educational materials, and care plan.  ? ?Thea Silversmith, RN, MSN, BSN, CCM ?Care  Management Coordinator ?[place] ?[phone]  ?

## 2022-03-22 NOTE — Chronic Care Management (AMB) (Signed)
?Chronic Care Management  ? ?CCM RN Visit Note ? ?03/22/2022 ?Name: Mark Stark. MRN: [id number] DOB: Dec 27, 1927 ? ?Subjective: ?Mark Stark. is a 87 y.o. year old male who is a primary care patient of [name], DO. The care management team was consulted for assistance with disease management and care coordination needs.   ? ?Engaged with patient by telephone for  follow up call. Patient left message requesting a return call  in response to provider referral for case management and/or care coordination services.  ? ?Consent to Services:  ?The patient was given information about Chronic Care Management services, agreed to services, and gave verbal consent prior to initiation of services.  Please see initial visit note for detailed documentation.  ? ?Patient agreed to services and verbal consent obtained.  ? ?Assessment: Review of patient past medical history, allergies, medications, health status, including review of consultants reports, laboratory and other test data, was performed as part of comprehensive evaluation and provision of chronic care management services.  ? ?SDOH (Social Determinants of Health) assessments and interventions performed:   ? ?CCM Care Plan ? ?Allergies  ?Allergen Reactions  ? Sulfonamide Derivatives Other (See Comments)  ?  Unknown - patient cannot remember reaction  ? ? ?Outpatient Encounter Medications as of 03/22/2022  ?Medication Sig Note  ? b complex vitamins tablet Take 1 tablet by mouth daily.   ? Calcium Carbonate-Vitamin D 600-400 MG-UNIT tablet Take 1 tablet by mouth daily.   ? clorazepate (TRANXENE) 15 MG tablet Take 1 tablet (15 mg total) by mouth at bedtime.   ? cromolyn (NASALCROM) 5.2 MG/ACT nasal spray Place 2 sprays into the nose 2 (two) times daily.   ? ELIQUIS 5 MG TABS tablet TAKE 1 TABLET BY MOUTH TWICE A DAY   ? fenofibrate 160 MG tablet TAKE 1 TABLET BY MOUTH EVERY DAY   ? fish oil-omega-3 fatty acids 1000 MG capsule Take 1 g by mouth 3 (three) times  daily. 11/08/2021: Reports takes twice a day.  ? metoprolol succinate (TOPROL XL) 25 MG 24 hr tablet Take 0.5 tablets (12.5 mg total) by mouth daily.   ? Multiple Vitamins-Minerals (MULTIVITAMIN PO) Take 1 tablet by mouth daily.   ? Multiple Vitamins-Minerals (PRESERVISION AREDS 2) CAPS 2 po qd (Patient taking differently: Take 1 tablet by mouth in the morning and at bedtime.)   ? potassium chloride (KLOR-CON) 10 MEQ tablet Take 1 tablet (10 mEq total) by mouth daily as needed (take when you take torsemide).   ? simvastatin (ZOCOR) 10 MG tablet TAKE 1 TABLET BY MOUTH EVERYDAY AT BEDTIME   ? tamsulosin (FLOMAX) 0.4 MG CAPS capsule Take 0.4 mg by mouth at bedtime.   ? torsemide (DEMADEX) 20 MG tablet Take 1 tablet (20 mg total) by mouth daily as needed (if weight up 3 pounds overnight or 5 lbs from baseline). TAKE 1 TABLET BY MOUTH EVERY DAY   ? vitamin C (ASCORBIC ACID) 500 MG tablet Take 500 mg by mouth daily.   ? ?No facility-administered encounter medications on file as of 03/22/2022.  ? ? ?Patient Active Problem List  ? Diagnosis Date Noted  ? Chronic diastolic CHF (congestive heart failure) ([place]) 11/09/2021  ? Nonrheumatic mitral valve regurgitation 10/28/2021  ? Acute kidney injury superimposed on CKD ([place]) 10/28/2021  ? Acute diastolic CHF (congestive heart failure) ([place]) 10/25/2021  ? Secondary hypercoagulable state ([place]) 08/23/2021  ? Prostate CA ([place]) 07/28/2021  ? Thrombocytopenia ([place]) 03/30/2021  ? Screening for  HIV (human immunodeficiency virus) 03/30/2021  ? Monocytosis 03/30/2021  ? Abnormal platelets ([place]) 03/28/2021  ? Hip pain 03/28/2021  ? Persistent atrial fibrillation ([place])   ? Paroxysmal atrial fibrillation ([place]) 01/12/2018  ? Cystoid macular edema of right eye 11/29/2017  ? Macular degeneration 12/21/2015  ? 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 eye with active choroidal neovascularization ([place]) 04/30/2014  ? Myalgia and myositis 11/16/2013  ? Epiretinal membrane 11/07/2012  ?  Posterior vitreous detachment 11/07/2012  ? Status post intraocular lens implant 11/07/2012  ? Fungal ear infection 04/09/2012  ? Abnormal laboratory test 03/27/2011  ? Aortic insufficiency 03/27/2011  ? SYSTOLIC MURMUR 86/76/7209  ? EDEMA 12/05/2010  ? ECCHYMOSES, SPONTANEOUS 11/28/2010  ? CONTUSION, RIGHT THIGH 11/28/2010  ? DECREASED HEARING, BILATERAL 09/23/2010  ? Pain in limb 07/28/2009  ? ANKLE SPRAIN, LEFT 07/28/2009  ? CONTUSION, LOWER LEG, LEFT 07/28/2009  ? DERMATITIS, CONTACT, NOS 10/02/2007  ? Essential hypertension 05/07/2007  ? PROSTATE CANCER, HX OF 03/13/2007  ? Hyperlipidemia 01/15/2007  ? Anxiety state 01/15/2007  ? DIVERTICULITIS, HX OF 01/15/2007  ? ? ?Conditions to be addressed/monitored:CHF and HTN ? ?Care Plan : RN Care Manager Plan of Care  ?Updates made by Luretha Rued, RN since 03/22/2022 12:00 AM  ?  ? ?Problem: Chronic disease Managment Education and/or Care Corodination needs (CHF/Atrial Fibrillation)   ?Priority: High  ?  ? ?Long-Range Goal: Development of Plan of Care for Chronic Disease Management (CHF/Atrial Fibrillation)   ?Start Date: 11/08/2021  ?Expected End Date: 09/13/2022  ?Priority: High  ?Note:   ?Current Barriers:  RNCM return call to patient- Mark Stark reports concerns regarding the medication Clorazepate: he expresses extra work involved with getting the prescription filled and concerns surrounding mandatory drug testing, in addition he reports he is billed $425 for testing. He expresses a sense of humiliation with having to go through drug testing process. He states "I feel kind of degraded with all this wild drug testing and having to pay for it too".  He reports it was initially prescribed due to issues with social anxiety, but feels that is not a problem now. He questions wether he needs to be on the medication at all. He states, it is on Dr. Judeth Cornfield medication list as well and that Dr. Harrell Gave thinks he needs to be on the medication. He reports "social  anxiety has disappeared now and getting the prescription filled is a pain". Mark Stark is asking, "do I really need it for my heart and health?" and adds "if not I would assume get rid of it". He also voiced the medication prescribed at night to help him sleep. RNCM reinforced that it is not unusual for this medication to be prescribed at bedtime due to side effects. He also reports he has been taking since 1982. RNCM advised patient to not just stop the medication without recommendation from his provider, to which he agreed. RNCM informed patient that he may no longer feel issues with social anxiety because the medication is working and doing what it is supposed to be doing. He expressed he had not thought about it from that point of view, but primarily from how the drug testing made he feel. He states he is second guessing his thoughts about possibly not needing the medications. RNCM recommended he discuss with primary care provider to be well informed regarding this or any other medication concern. RNCM also reminded Mark Stark that the embedded clinical pharmacist is also available to discuss with him. Mr.  Stark would like to discuss his concerns directly with Dr. [name]. He reports his blood pressures have been within normal limits for him. This morning BP 119/77. After exercise 130/95 and adds the bottom number is not usually in the 95 range.    ?Knowledge Deficits related to plan of care for management of Atrial Fibrillation and CHF  ?Chronic Disease Management support and education needs related to Atrial Fibrillation and CHF ? ?RNCM Clinical Goal(s):  ?Patient will verbalize understanding of plan for management of Atrial Fibrillation and CHF as evidenced by taking medications as prescribed, attending provider visit as scheduled, notifying provider with signs/symptoms of exacerbation. ?verbalize basic understanding of Atrial Fibrillation and CHF disease process and self health management plan as evidenced by  self report of managment ?take all medications exactly as prescribed and will call provider for medication related questions as evidenced by .    through collaboration with RN Care manager, provider, and c

## 2022-03-24 ENCOUNTER — Encounter (HOSPITAL_BASED_OUTPATIENT_CLINIC_OR_DEPARTMENT_OTHER): Payer: Self-pay | Admitting: Cardiology

## 2022-03-24 DIAGNOSIS — I480 Paroxysmal atrial fibrillation: Secondary | ICD-10-CM

## 2022-03-24 DIAGNOSIS — I5032 Chronic diastolic (congestive) heart failure: Secondary | ICD-10-CM

## 2022-03-24 DIAGNOSIS — I110 Hypertensive heart disease with heart failure: Secondary | ICD-10-CM

## 2022-03-24 DIAGNOSIS — E785 Hyperlipidemia, unspecified: Secondary | ICD-10-CM

## 2022-03-26 ENCOUNTER — Encounter (HOSPITAL_BASED_OUTPATIENT_CLINIC_OR_DEPARTMENT_OTHER): Payer: Self-pay | Admitting: Cardiology

## 2022-03-30 ENCOUNTER — Ambulatory Visit (INDEPENDENT_AMBULATORY_CARE_PROVIDER_SITE_OTHER): Payer: Medicare Other

## 2022-03-30 DIAGNOSIS — Z7901 Long term (current) use of anticoagulants: Secondary | ICD-10-CM | POA: Diagnosis not present

## 2022-03-30 DIAGNOSIS — I5032 Chronic diastolic (congestive) heart failure: Secondary | ICD-10-CM

## 2022-03-30 DIAGNOSIS — H43813 Vitreous degeneration, bilateral: Secondary | ICD-10-CM | POA: Diagnosis not present

## 2022-03-30 DIAGNOSIS — H353231 Exudative age-related macular degeneration, bilateral, with active choroidal neovascularization: Secondary | ICD-10-CM | POA: Diagnosis not present

## 2022-03-30 DIAGNOSIS — Z9181 History of falling: Secondary | ICD-10-CM | POA: Diagnosis not present

## 2022-03-30 DIAGNOSIS — H35373 Puckering of macula, bilateral: Secondary | ICD-10-CM | POA: Diagnosis not present

## 2022-03-30 DIAGNOSIS — I4891 Unspecified atrial fibrillation: Secondary | ICD-10-CM | POA: Diagnosis not present

## 2022-03-30 DIAGNOSIS — I10 Essential (primary) hypertension: Secondary | ICD-10-CM

## 2022-03-30 DIAGNOSIS — S0512XD Contusion of eyeball and orbital tissues, left eye, subsequent encounter: Secondary | ICD-10-CM | POA: Diagnosis not present

## 2022-03-30 NOTE — Patient Instructions (Signed)
Visit Information ? ?Thank you for taking time to visit with me today. Please don't hesitate to contact me if I can be of assistance to you before our next scheduled telephone appointment. ? ?Following are the goals we discussed today:  ?Patient Goals/Self-Care Activities: ?Contact Provider with any health questions/concerns as needed  ?Continue to take medications as prescribed  ?Attend all scheduled provider appointments ?Continue to check blood pressure and heart rate(pulse) as recommended by your provider and notify provider if out of recommended range. ?Call your cardiologist or primary care provider: signs/symptoms of worsening heart failure: weight gain of 3 pounds in a 24 hour period or 5 pounds in a week, increased swelling of stomach, feet/legs or hands. Increased shortness of breath, having less energy than normal, new or worsening dizziness, if it is harder for you to breathe when lying down and you need to sit up to breathe or you feel that something is not right. ?Continue to remain active per provider recommendations ?Eat Healthy: whole grains, lean meats, fruits, vegetable, healthy fats, monitor salt intake ?Contact your RN Care Manager as needed for care coordination and/or care management needs ? ?Our next appointment is by telephone on 05/11/22 at 10:45 am ? ?Please call the care guide team at [phone] if you need to cancel or reschedule your appointment.  ? ?If you are experiencing a Mental Health or [place] or need someone to talk to, please call the Suicide and Crisis Lifeline: 988 ?call 1-800-273-TALK (toll free, 24 hour hotline)  ? ?The patient verbalized understanding of instructions, educational materials, and care plan provided today and agreed to receive a mailed copy of patient instructions, educational materials, and care plan.  ? ?Mark Silversmith, RN, MSN, BSN, CCM ?Care Management Coordinator ?[facility] ?[phone]  ? ?Using Medicines  Safely ?Medicines can keep you healthy, but it is important to use them correctly and carefully. If you do not take your medicines as told by your health care provider, you can harm your health. This applies not only to prescription medicines but also to over-the-counter medicines, vitamins, supplements, and herbal remedies. ?Take over-the-counter and prescription medicines only as told by your health care provider. ?At each appointment, talk to your health care provider about all of the medicines you take. ?Things you must know about your medicine ?Make sure you know: ?Basic information about your medicine, such as: ?The name. This includes all brand names and generic names. ?Why you are taking it. ?How much you can take. ?How often you can take it. ?How to take your medicine, including: ?If you need to take it before, with, or after meals. ?What foods you should avoid. ?If you need to avoid alcohol. ?What to do if you miss a dose. ?If it interacts with any other medicines. ?Possible side effects and what to do if you have any. This includes: ?Signs of an allergic reaction. ?When to call your health care provider. ?Ask your health care provider or pharmacist: ?How long it will take for your medicine to start working. ?If you will need refills or not. ?If you can take a generic version instead of the brand name. ?If you can take the medicine if you are pregnant or breastfeeding. ?About other options to treat your condition instead of medicine. ?What actions can I take to use medicines carefully? ?Things to avoid ?Do not share your medicine with anyone else. ?Do not take anyone else's medicine. ?Do not split, mash, or chew your medicine unless your health  care provider or pharmacist says you can. Tell your health care provider if you are having problems swallowing your medicine. ?Do not take medicine in the dark. Make sure you can see which medicines you are taking. ?Do not stop taking your medicines without first  contacting your health care provider. ?Things to do at the pharmacy ? ?Let your pharmacist know if the information on the label has changed, or if the size, shape, or color of the medicine looks different. ?Make sure you can read the label and open your medicine container. If you have problems, tell your pharmacist. ?Tracking your medicine use ? ?Track how you use your medicine by writing down when you take it. Download a tracking worksheet at this website: https://lee-mcguire.com/ ?Use the same pharmacy as much as possible. They will know you and your medicine history. ?Organize your medicines. If you use a pill box, keep the original medicine container and information. You may want to keep a file for medicine information. ?Set an alarm to make sure you take your medicines on time. Try using one on your mobile device or smartphone. ?Give a copy of your medicine information to a trusted friend or family member. ?Track your refill dates and expiration dates. Try using one on your mobile device or smartphone. ?Storing your medicine ?Store your medicines in a cool, dry, and dark place. Do not store medicine in your bathroom because they are often moist and humid. ?Keep medicines out of reach of children and pets. ?When traveling ?Bring all your medicine information and contact numbers. ?Refill your prescription medicines before you leave, if needed. ?If you are traveling by plane, keep your medicines in your carry-on bag. ?Bring extra medicine with you in case your travel plans change. ?General tips ?Before you take your medicine, read labels and instructions carefully. ?Throw away old or unused medicines safely. To learn more, go to [employer] and search "safe disposal of medicine." ?What can happen if these actions are not taken? ?You may be at greater risk for side effects and harm from your medicine. ?Your medicine may not work as it is supposed to. ?Your condition may not get  better or it may get worse. ?You may have complications from your condition. ?Your medicine may react with other medicines you take. ?You may have additional medical expenses if you experience side effects or take longer to get better. ?Other people may be harmed if they take your medicine. ?You could become dependent and suffer withdrawal symptoms if you do not take prescription pain medicine as told by your health care provider. ?Where to find more information ?U.S. Food and Drug Administration, Use Medicines Wisely: https://www.porter.info/ ?U.S. Department of Health and [employer], Use Medicines Safely: https://healthfinder.gov ?Choosing Wisely, Taking Medicines Safely: http://www.choosingwisely.org ?Contact a health care provider if: ?You have questions about your medicine. ?You are ill and not able to take your medicine. ?You have side effects from your medicine. ?You miss a dose or doses of your medicine. ?Get help right away if: ?You have symptoms of an allergic reaction to your medicine. ?You think that you or someone else may have taken too much medicine (overdose). The hotline of the [facility] is 720 414 2058. ?Medication allergy or overdose is an emergency. Do not wait to see if the symptoms will go away. Get medical help right away. Call your local emergency services (911 in the [place] Do not drive yourself to the hospital. ?Summary ?Taking medicines is sometimes necessary to keep you healthy, but  it is important to use them correctly and carefully. ?Read medicine labels and instructions carefully. ?Talk to your health care provider about all the medicines you are taking. ?If you do not use your medicine correctly, you may be at higher risk for side effects and other dangerous health problems. Your medicine may not work as planned, or your condition may not improve or get worse. ?Contact your health care provider if you have questions about your medicines, or if you experience any  side effects. ?This information is not intended to replace advice given to you by your health care provider. Make sure you discuss any questions you have with your health care provider. ?Document Revised: 08/15/2021 Doc

## 2022-03-30 NOTE — Chronic Care Management (AMB) (Signed)
?Chronic Care Management  ? ?CCM RN Visit Note ? ?03/30/2022 ?Name: Mark Stark. MRN: [id number] DOB: 29-May-1928 ? ?Subjective: ?Mark Stark. is a 88 y.o. year old male who is a primary care patient of [name], DO. The care management team was consulted for assistance with disease management and care coordination needs.   ? ?Engaged with patient by telephone for follow up visit in response to provider referral for case management and/or care coordination services.  ? ?Consent to Services:  ?The patient was given information about Chronic Care Management services, agreed to services, and gave verbal consent prior to initiation of services.  Please see initial visit note for detailed documentation.  ? ?Patient agreed to services and verbal consent obtained.  ? ?Assessment: Review of patient past medical history, allergies, medications, health status, including review of consultants reports, laboratory and other test data, was performed as part of comprehensive evaluation and provision of chronic care management services.  ? ?SDOH (Social Determinants of Health) assessments and interventions performed:   ? ?CCM Care Plan ? ?Allergies  ?Allergen Reactions  ? Sulfonamide Derivatives Other (See Comments)  ?  Unknown - patient cannot remember reaction  ? ? ?Outpatient Encounter Medications as of 03/30/2022  ?Medication Sig Note  ? b complex vitamins tablet Take 1 tablet by mouth daily.   ? Calcium Carbonate-Vitamin D 600-400 MG-UNIT tablet Take 1 tablet by mouth daily.   ? clorazepate (TRANXENE) 15 MG tablet Take 1 tablet (15 mg total) by mouth at bedtime.   ? cromolyn (NASALCROM) 5.2 MG/ACT nasal spray Place 2 sprays into the nose 2 (two) times daily.   ? ELIQUIS 5 MG TABS tablet TAKE 1 TABLET BY MOUTH TWICE A DAY   ? fenofibrate 160 MG tablet TAKE 1 TABLET BY MOUTH EVERY DAY   ? fish oil-omega-3 fatty acids 1000 MG capsule Take 1 g by mouth 3 (three) times daily. 11/08/2021: Reports takes twice a day.  ?  metoprolol succinate (TOPROL XL) 25 MG 24 hr tablet Take 0.5 tablets (12.5 mg total) by mouth daily.   ? Multiple Vitamins-Minerals (MULTIVITAMIN PO) Take 1 tablet by mouth daily.   ? Multiple Vitamins-Minerals (PRESERVISION AREDS 2) CAPS 2 po qd (Patient taking differently: Take 1 tablet by mouth in the morning and at bedtime.)   ? potassium chloride (KLOR-CON) 10 MEQ tablet Take 1 tablet (10 mEq total) by mouth daily as needed (take when you take torsemide).   ? simvastatin (ZOCOR) 10 MG tablet TAKE 1 TABLET BY MOUTH EVERYDAY AT BEDTIME   ? tamsulosin (FLOMAX) 0.4 MG CAPS capsule Take 0.4 mg by mouth at bedtime.   ? torsemide (DEMADEX) 20 MG tablet Take 1 tablet (20 mg total) by mouth daily as needed (if weight up 3 pounds overnight or 5 lbs from baseline). TAKE 1 TABLET BY MOUTH EVERY DAY   ? vitamin C (ASCORBIC ACID) 500 MG tablet Take 500 mg by mouth daily.   ? ?No facility-administered encounter medications on file as of 03/30/2022.  ? ? ?Patient Active Problem List  ? Diagnosis Date Noted  ? Chronic diastolic CHF (congestive heart failure) ([place]) 11/09/2021  ? Nonrheumatic mitral valve regurgitation 10/28/2021  ? Acute kidney injury superimposed on CKD ([place]) 10/28/2021  ? Acute diastolic CHF (congestive heart failure) ([place]) 10/25/2021  ? Secondary hypercoagulable state ([place]) 08/23/2021  ? Prostate CA ([place]) 07/28/2021  ? Thrombocytopenia ([place]) 03/30/2021  ? Screening for HIV (human immunodeficiency virus) 03/30/2021  ? Monocytosis 03/30/2021  ?  Abnormal platelets ([place]) 03/28/2021  ? Hip pain 03/28/2021  ? Persistent atrial fibrillation ([place])   ? Paroxysmal atrial fibrillation ([place]) 01/12/2018  ? Cystoid macular edema of right eye 11/29/2017  ? Macular degeneration 12/21/2015  ? 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 eye with active choroidal neovascularization ([place]) 04/30/2014  ? Myalgia and myositis 11/16/2013  ? Epiretinal membrane 11/07/2012  ? Posterior vitreous detachment 11/07/2012  ? Status  post intraocular lens implant 11/07/2012  ? Fungal ear infection 04/09/2012  ? Abnormal laboratory test 03/27/2011  ? Aortic insufficiency 03/27/2011  ? SYSTOLIC MURMUR 03/15/2247  ? EDEMA 12/05/2010  ? ECCHYMOSES, SPONTANEOUS 11/28/2010  ? CONTUSION, RIGHT THIGH 11/28/2010  ? DECREASED HEARING, BILATERAL 09/23/2010  ? Pain in limb 07/28/2009  ? ANKLE SPRAIN, LEFT 07/28/2009  ? CONTUSION, LOWER LEG, LEFT 07/28/2009  ? DERMATITIS, CONTACT, NOS 10/02/2007  ? Essential hypertension 05/07/2007  ? PROSTATE CANCER, HX OF 03/13/2007  ? Hyperlipidemia 01/15/2007  ? Anxiety state 01/15/2007  ? DIVERTICULITIS, HX OF 01/15/2007  ? ? ?Conditions to be addressed/monitored:CHF and HTN ? ?Care Plan : RN Care Manager Plan of Care  ?Updates made by Luretha Rued, RN since 03/30/2022 12:00 AM  ?  ? ?Problem: Chronic disease Managment Education and/or Care Corodination needs (CHF/Atrial Fibrillation)   ?Priority: High  ?  ? ?Long-Range Goal: Development of Plan of Care for Chronic Disease Management (CHF/Atrial Fibrillation)   ?Start Date: 11/08/2021  ?Expected End Date: 09/13/2022  ?Priority: High  ?Note:   ?Current Barriers: RNCM returned call to patient. Mr. Beirne states he has been thinking since last telephone encounter regarding clorazepate. He expresses that he was reacting to being tested for 28 different drugs and how that made him feel along with being charged $425. But states he has been on the medication for 40 years and that the medication is working so he is willing to go through the required drug testing process. He states he does not want to make changes to his medications. RNCM listened to his concerns and emphasized that is is appropriate to discuss any medication question or concern with his doctor/provider and encouraged him to discuss if it remains or becomes a concern again.      ?Knowledge Deficits related to plan of care for management of Atrial Fibrillation and CHF  ?Chronic Disease Management support and  education needs related to Atrial Fibrillation and CHF ? ?RNCM Clinical Goal(s):  ?Patient will verbalize understanding of plan for management of Atrial Fibrillation and CHF as evidenced by taking medications as prescribed, attending provider visit as scheduled, notifying provider with signs/symptoms of exacerbation. ?verbalize basic understanding of Atrial Fibrillation and CHF disease process and self health management plan as evidenced by self report of managment ?take all medications exactly as prescribed and will call provider for medication related questions as evidenced by .    through collaboration with RN Care manager, provider, and care team.  ?Maintain and/or improved quality of life-the patient will demonstrate ongoing self health care management ability as evidenced by attending provider visits, taking medications as prescribed, continuing activities that encourage fulfullment in his life such as walking, gardening, outside activites. ? ?Interventions: ?1:1 collaboration with primary care provider regarding development and update of comprehensive plan of care as evidenced by provider attestation and co-signature ?Inter-disciplinary care team collaboration (see longitudinal plan of care) ?Evaluation of current treatment plan related to  self management and patient's adherence to plan as established by provider ?Provided Calender/Organizer which includes action plan for CHF and  Atrial fibrillation ? ?03/30/22 UPDATE: Medication Question re: Clorazepate Interventions:  (Status:   Patient states this is no longer a goal )  Short Term Goal ?Provided nonjudgmental environment to allow patient to express concerns/feelings ?RNCM listened to patient's concerns and emphasized it is ok to express concerns/questions to PCP/providers ?Advised patient to discuss any medication question or concern with his doctor/provider and encouraged him to discuss if it remains or becomes a concern again.  ?RNCM will update clinical  pharmacist to see if patient has any other questions/concerns at the next pharmacy follow up visit ?PCP updated ? ?Hypertension Interventions:  (Status:  New goal.) Long Term Goal ?03/22/22 119/77; after exerci

## 2022-04-10 ENCOUNTER — Telehealth: Payer: Medicare Other

## 2022-04-17 ENCOUNTER — Other Ambulatory Visit: Payer: Self-pay | Admitting: Family Medicine

## 2022-04-23 DIAGNOSIS — I10 Essential (primary) hypertension: Secondary | ICD-10-CM | POA: Diagnosis not present

## 2022-04-23 DIAGNOSIS — I5032 Chronic diastolic (congestive) heart failure: Secondary | ICD-10-CM | POA: Diagnosis not present

## 2022-05-04 ENCOUNTER — Other Ambulatory Visit: Payer: Self-pay | Admitting: Family Medicine

## 2022-05-04 DIAGNOSIS — I480 Paroxysmal atrial fibrillation: Secondary | ICD-10-CM

## 2022-05-11 ENCOUNTER — Ambulatory Visit (INDEPENDENT_AMBULATORY_CARE_PROVIDER_SITE_OTHER): Payer: Medicare Other

## 2022-05-11 DIAGNOSIS — I10 Essential (primary) hypertension: Secondary | ICD-10-CM

## 2022-05-11 DIAGNOSIS — I4819 Other persistent atrial fibrillation: Secondary | ICD-10-CM

## 2022-05-11 DIAGNOSIS — I5032 Chronic diastolic (congestive) heart failure: Secondary | ICD-10-CM

## 2022-05-11 NOTE — Patient Instructions (Addendum)
Visit Information  Thank you for taking time to visit with me today. Please don't hesitate to contact me if I can be of assistance to you before our next scheduled telephone appointment.  Following are the goals we discussed today:  Patient Goals/Self-Care Activities: Call to schedule follow up appointment with cardiologist Contact Provider with any health questions/concerns as needed  Continue to take medications as prescribed  Continue to check blood pressure and heart rate(pulse) as recommended by your provider and notify provider if out of recommended range. Call your cardiologist or primary care provider: signs/symptoms of worsening heart failure: weight gain of 3 pounds in a 24 hour period or 5 pounds in a week, increased swelling of stomach, feet/legs or hands. Increased shortness of breath, having less energy than normal, new or worsening dizziness, if it is harder for you to breathe when lying down and you need to sit up to breathe or you feel that something is not right. Contact your cardiologist for signs of atrial fibrillation exacerbation: racing heartbeat that may be uncomfortable, shortness of breath with or without chest pain, weakness(unusually tired or feeling faint, dizziness. Continue to remain active per provider recommendations Eat Healthy: whole grains, lean meats, fruits, vegetable, healthy fats, low salt diet Contact your RN Care Manager as needed for care coordination and/or care management needs  Our next appointment is by telephone on 07/03/22 at 2:00 pm  Please call the care guide team at [phone] if you need to cancel or reschedule your appointment.   If you are experiencing a Mental Health or [place] or need someone to talk to, please call the Suicide and Crisis Lifeline: 988 call 1-800-273-TALK (toll free, 24 hour hotline)   The patient verbalized understanding of instructions, educational materials, and care plan provided today and agreed to  receive a mailed copy of patient instructions, educational materials, and care plan.   Thea Silversmith, RN, MSN, BSN, CCM Care Management Coordinator [facility] [phone]   Atrial Fibrillation  Atrial fibrillation is a type of irregular or rapid heartbeat (arrhythmia). In atrial fibrillation, the top part of the heart (atria) beats in an irregular pattern. This makes the heart unable to pump blood normally and effectively. The goal of treatment is to prevent blood clots from forming, control your heart rate, or restore your heartbeat to a normal rhythm. If this condition is not treated, it can cause serious problems, such as a weakened heart muscle (cardiomyopathy) or a stroke. What are the causes? This condition is often caused by medical conditions that damage the heart's electrical system. These include: High blood pressure (hypertension). This is the most common cause. Certain heart problems or conditions, such as heart failure, coronary artery disease, heart valve problems, or heart surgery. Diabetes. Overactive thyroid (hyperthyroidism). Obesity. Chronic kidney disease. In some cases, the cause of this condition is not known. What increases the risk? This condition is more likely to develop in: Older people. People who smoke. Athletes who do endurance exercise. People who have a family history of atrial fibrillation. Men. People who use drugs. People who drink a lot of alcohol. People who have lung conditions, such as emphysema, pneumonia, or COPD. People who have obstructive sleep apnea. What are the signs or symptoms? Symptoms of this condition include: A feeling that your heart is racing or beating irregularly. Discomfort or pain in your chest. Shortness of breath. Sudden light-headedness or weakness. Tiring easily during exercise or activity. Fatigue. Syncope (fainting). Sweating. In some cases, there  are no symptoms. How is this diagnosed? Your  health care provider may detect atrial fibrillation when taking your pulse. If detected, this condition may be diagnosed with: An electrocardiogram (ECG) to check electrical signals of the heart. An ambulatory cardiac monitor to record your heart's activity for a few days. A transthoracic echocardiogram (TTE) to create pictures of your heart. A transesophageal echocardiogram (TEE) to create even closer pictures of your heart. A stress test to check your blood supply while you exercise. Imaging tests, such as a CT scan or chest X-ray. Blood tests. How is this treated? Treatment depends on underlying conditions and how you feel when you experience atrial fibrillation. This condition may be treated with: Medicines to prevent blood clots or to treat heart rate or heart rhythm problems. Electrical cardioversion to reset the heart's rhythm. A pacemaker to correct abnormal heart rhythm. Ablation to remove the heart tissue that sends abnormal signals. Left atrial appendage closure to seal the area where blood clots can form. In some cases, underlying conditions will be treated. Follow these instructions at home: Medicines Take over-the counter and prescription medicines only as told by your health care provider. Do not take any new medicines without talking to your health care provider. If you are taking blood thinners: Talk with your health care provider before you take any medicines that contain aspirin or NSAIDs, such as ibuprofen. These medicines increase your risk for dangerous bleeding. Take your medicine exactly as told, at the same time every day. Avoid activities that could cause injury or bruising, and follow instructions about how to prevent falls. Wear a medical alert bracelet or carry a card that lists what medicines you take. Lifestyle     Do not use any products that contain nicotine or tobacco, such as cigarettes, e-cigarettes, and chewing tobacco. If you need help quitting, ask  your health care provider. Eat heart-healthy foods. Talk with a dietitian to make an eating plan that is right for you. Exercise regularly as told by your health care provider. Do not drink alcohol. Lose weight if you are overweight. Do not use drugs, including cannabis. General instructions If you have obstructive sleep apnea, manage your condition as told by your health care provider. Do not use diet pills unless your health care provider approves. Diet pills can make heart problems worse. Keep all follow-up visits as told by your health care provider. This is important. Contact a health care provider if you: Notice a change in the rate, rhythm, or strength of your heartbeat. Are taking a blood thinner and you notice more bruising. Tire more easily when you exercise or do heavy work. Have a sudden change in weight. Get help right away if you have:  Chest pain, abdominal pain, sweating, or weakness. Trouble breathing. Side effects of blood thinners, such as blood in your vomit, stool, or urine, or bleeding that cannot stop. Any symptoms of a stroke. "BE FAST" is an easy way to remember the main warning signs of a stroke: B - Balance. Signs are dizziness, sudden trouble walking, or loss of balance. E - Eyes. Signs are trouble seeing or a sudden change in vision. F - Face. Signs are sudden weakness or numbness of the face, or the face or eyelid drooping on one side. A - Arms. Signs are weakness or numbness in an arm. This happens suddenly and usually on one side of the body. S - Speech. Signs are sudden trouble speaking, slurred speech, or trouble understanding what people say. T -  Time. Time to call emergency services. Write down what time symptoms started. Other signs of a stroke, such as: A sudden, severe headache with no known cause. Nausea or vomiting. Seizure. These symptoms may represent a serious problem that is an emergency. Do not wait to see if the symptoms will go away. Get  medical help right away. Call your local emergency services (911 in the [place] Do not drive yourself to the hospital. Summary Atrial fibrillation is a type of irregular or rapid heartbeat (arrhythmia). Symptoms include a feeling that your heart is beating fast or irregularly. You may be given medicines to prevent blood clots or to treat heart rate or heart rhythm problems. Get help right away if you have signs or symptoms of a stroke. Get help right away if you cannot catch your breath or have chest pain or pressure. This information is not intended to replace advice given to you by your health care provider. Make sure you discuss any questions you have with your health care provider. Document Revised: 06/04/2019 Document Reviewed: 06/04/2019 Elsevier Patient Education  [place].

## 2022-05-11 NOTE — Chronic Care Management (AMB) (Signed)
Chronic Care Management   CCM RN Visit Note  05/11/2022 Name: Mark Stark. MRN: [id number] DOB: 1928/10/06  Subjective: Mark Stark. is a 88 y.o. year old male who is a primary care patient of Mark Held, DO. The care management team was consulted for assistance with disease management and care coordination needs.    Engaged with patient by telephone for follow up visit in response to provider referral for case management and/or care coordination services.   Consent to Services:  The patient was given information about Chronic Care Management services, agreed to services, and gave verbal consent prior to initiation of services.  Please see initial visit note for detailed documentation.   Patient agreed to services and verbal consent obtained.   Assessment: Review of patient past medical history, allergies, medications, health status, including review of consultants reports, laboratory and other test data, was performed as part of comprehensive evaluation and provision of chronic care management services.   SDOH (Social Determinants of Health) assessments and interventions performed:    CCM Care Plan  Allergies  Allergen Reactions   Sulfonamide Derivatives Other (See Comments)    Unknown - patient cannot remember reaction    Outpatient Encounter Medications as of 05/11/2022  Medication Sig Note   b complex vitamins tablet Take 1 tablet by mouth daily.    Calcium Carbonate-Vitamin D 600-400 MG-UNIT tablet Take 1 tablet by mouth daily.    clorazepate (TRANXENE) 15 MG tablet Take 1 tablet (15 mg total) by mouth at bedtime.    cromolyn (NASALCROM) 5.2 MG/ACT nasal spray Place 2 sprays into the nose 2 (two) times daily.    ELIQUIS 5 MG TABS tablet TAKE 1 TABLET BY MOUTH TWICE A DAY    fenofibrate 160 MG tablet TAKE 1 TABLET BY MOUTH EVERY DAY    fish oil-omega-3 fatty acids 1000 MG capsule Take 1 g by mouth 3 (three) times daily. 11/08/2021: Reports takes twice a day.    metoprolol succinate (TOPROL XL) 25 MG 24 hr tablet Take 0.5 tablets (12.5 mg total) by mouth daily.    Multiple Vitamins-Minerals (MULTIVITAMIN PO) Take 1 tablet by mouth daily.    Multiple Vitamins-Minerals (PRESERVISION AREDS 2) CAPS 2 po qd (Patient taking differently: Take 1 tablet by mouth in the morning and at bedtime.)    potassium chloride (KLOR-CON) 10 MEQ tablet Take 1 tablet (10 mEq total) by mouth daily as needed (take when you take torsemide).    simvastatin (ZOCOR) 10 MG tablet TAKE 1 TABLET BY MOUTH EVERYDAY AT BEDTIME    tamsulosin (FLOMAX) 0.4 MG CAPS capsule Take 0.4 mg by mouth at bedtime.    torsemide (DEMADEX) 20 MG tablet Take 1 tablet (20 mg total) by mouth daily as needed (if weight up 3 pounds overnight or 5 lbs from baseline). TAKE 1 TABLET BY MOUTH EVERY DAY    vitamin C (ASCORBIC ACID) 500 MG tablet Take 500 mg by mouth daily.    No facility-administered encounter medications on file as of 05/11/2022.    Patient Active Problem List   Diagnosis Date Noted   Chronic diastolic CHF (congestive heart failure) ([place]) 11/09/2021   Nonrheumatic mitral valve regurgitation 10/28/2021   Acute kidney injury superimposed on CKD ([place]) 84/13/2440   Acute diastolic CHF (congestive heart failure) ([place]) 10/25/2021   Secondary hypercoagulable state ([place]) 08/23/2021   Prostate CA ([place]) 07/28/2021   Thrombocytopenia ([place]) 03/30/2021   Screening for HIV (human immunodeficiency virus) 03/30/2021   Monocytosis 03/30/2021  Abnormal platelets ([place]) 03/28/2021   Hip pain 03/28/2021   Persistent atrial fibrillation ([facility])    Paroxysmal atrial fibrillation ([place]) 01/12/2018   Cystoid macular edema of right eye 11/29/2017   Macular degeneration 12/21/2015   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 eye with active choroidal neovascularization ([place]) 04/30/2014   Myalgia and myositis 11/16/2013   Epiretinal membrane 11/07/2012   Posterior vitreous detachment 11/07/2012   Status  post intraocular lens implant 11/07/2012   Fungal ear infection 04/09/2012   Abnormal laboratory test 03/27/2011   Aortic insufficiency 89/38/1017   SYSTOLIC MURMUR 51/01/5851   EDEMA 12/05/2010   ECCHYMOSES, SPONTANEOUS 11/28/2010   CONTUSION, RIGHT THIGH 11/28/2010   DECREASED HEARING, BILATERAL 09/23/2010   Pain in limb 07/28/2009   ANKLE SPRAIN, LEFT 07/28/2009   CONTUSION, LOWER LEG, LEFT 07/28/2009   DERMATITIS, CONTACT, NOS 10/02/2007   Essential hypertension 05/07/2007   PROSTATE CANCER, HX OF 03/13/2007   Hyperlipidemia 01/15/2007   Anxiety state 01/15/2007   DIVERTICULITIS, HX OF 01/15/2007    Conditions to be addressed/monitored:Atrial Fibrillation, CHF, and HTN  Care Plan : RN Care Manager Plan of Care  Updates made by Luretha Rued, RN since 05/11/2022 12:00 AM     Problem: Chronic disease Managment Education and/or Care Corodination needs (CHF/Atrial Fibrillation)   Priority: High     Long-Range Goal: Development of Plan of Care for Chronic Disease Management (CHF/Atrial Fibrillation)   Start Date: 11/08/2021  Expected End Date: 09/13/2022  Priority: High  Note:   Current Barriers:  05/11/22 Mark Stark reports he is doing well. He states he updated Advance directive on yesterday. He reports he continues to remain active and is working in his garden (picked peas and shelled them yesterday. Denies any signs/symptoms of heart failure exacerbation. Denies any signs/symptoms of worsening atrial fibrillation. Per cardiologist 02/13/22 office visit "now permanent atrial fib". Reports last 5 BP readings: 128//78 HR 100 (today); 129/86 HR 110; 129/82 HR 89; 123/85 HR 97; 122/93 HR 89. Mark Stark reports heart rate range 89-118, but states it does not remain elevated. Knowledge Deficits related to plan of care for management of Atrial Fibrillation and CHF  Chronic Disease Management support and education needs related to Atrial Fibrillation and CHF  RNCM Clinical Goal(s):   Patient will verbalize understanding of plan for management of Atrial Fibrillation and CHF as evidenced by taking medications as prescribed, attending provider visit as scheduled, notifying provider with signs/symptoms of exacerbation. verbalize basic understanding of Atrial Fibrillation and CHF disease process and self health management plan as evidenced by self report of managment take all medications exactly as prescribed and will call provider for medication related questions as evidenced by .    through collaboration with RN Care manager, provider, and care team.  Maintain and/or improved quality of life-the patient will demonstrate ongoing self health care management ability as evidenced by attending provider visits, taking medications as prescribed, continuing activities that encourage fulfullment in his life such as walking, gardening, outside activites.  Interventions: 1:1 collaboration with primary care provider regarding development and update of comprehensive plan of care as evidenced by provider attestation and co-signature Inter-disciplinary care team collaboration (see longitudinal plan of care) Evaluation of current treatment plan related to  self management and patient's adherence to plan as established by provider  Hypertension Interventions:  (Status:  Goal on track:  Yes.) Long Term Goal Home BP: 05/11/22 128//78, 05/10/22 129/86, 05/09/22 129/82, 05/08/22 122/81, 05/07/22 123/85, 05/06/22 122/93 03/22/22 119/77; after exercise 130/95 Last practice  recorded BP readings:  BP Readings from Last 3 Encounters:  02/17/22 108/72  02/13/22 130/72  02/10/22 (!) 120/55  Most recent eGFR/CrCl:  Lab Results  Component Value Date   EGFR 46 (L) 11/28/2021    No components found for: CRCL  Referral to clinical pharmacist for medication reconciliation/medication discrepancy-completed Encouraged to continue to keep track of and record blood pressure readings  Heart Failure Interventions:  Long Term: Goal on track: Yes Discussed the importance of keeping all appointments with provider Discussed signs/symptoms of heart failure exacerbation Emphasized the importance of knowing what is normal for patient and what is not in order to alert the providers as soon as possible Discussed medications and encouraged to take torsemide/potassium when needed as prescribed Discussed the importance of remaining active Reviewed upcoming/scheduled appointments Encouraged patient to call to schedule follow up appointment with cardiologist. Last visit 02/13/22 recommended follow up in 6 months follow up(has not been scheduled).  AFIB Interventions: Long term: Goal on track: Yes reports heart rate range 89-118, but states it does not remain elevated. Reviewed Heart rate(per BP monitor) with patient Reviewed signs/symptoms of exacerbation of atrial fibrillation and encouraged to contact provider as needed Reviewed upcoming/scheduled appointments Advised patient to discuss any questions or concerns with provider Encouraged to call to schedule follow up appointment with cardiologist. Last visit 02/13/22 recommended 6 month follow up-has not been scheduled  Patient Goals/Self-Care Activities: Call to schedule follow up appointment with cardiologist Contact Provider with any health questions/concerns as needed  Continue to take medications as prescribed  Continue to check blood pressure and heart rate(pulse) as recommended by your provider and notify provider if out of recommended range. Call your cardiologist or primary care provider: signs/symptoms of worsening heart failure: weight gain of 3 pounds in a 24 hour period or 5 pounds in a week, increased swelling of stomach, feet/legs or hands. Increased shortness of breath, having less energy than normal, new or worsening dizziness, if it is harder for you to breathe when lying down and you need to sit up to breathe or you feel that something is not  right. Contact your cardiologist for signs of atrial fibrillation exacerbation: racing heartbeat that may be uncomfortable, shortness of breath with or without chest pain, weakness(unusually tired or feeling faint, dizziness. Continue to remain active per provider recommendations Eat Healthy: whole grains, lean meats, fruits, vegetable, healthy fats, low salt diet Contact your RN Care Manager as needed for care coordination and/or care management needs   Plan:Telephone follow up appointment with care management team member scheduled for:  07/03/22 The patient has been provided with contact information for the care management team and has been advised to call with any health related questions or concerns.   Thea Silversmith, RN, MSN, BSN, CCM Care Management Coordinator [facility] [phone]

## 2022-05-15 ENCOUNTER — Other Ambulatory Visit: Payer: Self-pay | Admitting: Family Medicine

## 2022-05-15 DIAGNOSIS — F411 Generalized anxiety disorder: Secondary | ICD-10-CM

## 2022-05-15 NOTE — Telephone Encounter (Signed)
Requesting: clorazepate '15mg'$   Contract: 07/28/21 UDS: 02/10/22 Last Visit: 02/10/22 Next Visit: 08/10/22 Last Refill: 02/10/22 #30 and 1RF  Please Advise

## 2022-05-24 DIAGNOSIS — I509 Heart failure, unspecified: Secondary | ICD-10-CM

## 2022-05-24 DIAGNOSIS — I4891 Unspecified atrial fibrillation: Secondary | ICD-10-CM | POA: Diagnosis not present

## 2022-05-24 DIAGNOSIS — I110 Hypertensive heart disease with heart failure: Secondary | ICD-10-CM

## 2022-06-08 DIAGNOSIS — H43813 Vitreous degeneration, bilateral: Secondary | ICD-10-CM | POA: Diagnosis not present

## 2022-06-08 DIAGNOSIS — H401122 Primary open-angle glaucoma, left eye, moderate stage: Secondary | ICD-10-CM | POA: Insufficient documentation

## 2022-06-08 DIAGNOSIS — H353211 Exudative age-related macular degeneration, right eye, with active choroidal neovascularization: Secondary | ICD-10-CM | POA: Diagnosis not present

## 2022-06-08 DIAGNOSIS — H353221 Exudative age-related macular degeneration, left eye, with active choroidal neovascularization: Secondary | ICD-10-CM | POA: Diagnosis not present

## 2022-06-08 DIAGNOSIS — H35373 Puckering of macula, bilateral: Secondary | ICD-10-CM | POA: Diagnosis not present

## 2022-06-19 ENCOUNTER — Ambulatory Visit: Payer: Self-pay

## 2022-06-19 DIAGNOSIS — I4819 Other persistent atrial fibrillation: Secondary | ICD-10-CM

## 2022-06-19 DIAGNOSIS — I5032 Chronic diastolic (congestive) heart failure: Secondary | ICD-10-CM

## 2022-06-19 DIAGNOSIS — I10 Essential (primary) hypertension: Secondary | ICD-10-CM

## 2022-06-19 NOTE — Chronic Care Management (AMB) (Signed)
Chronic Care Management   CCM RN Visit Note  06/19/2022 Name: Mark Stark. MRN: [id number] DOB: [birth year]  Subjective: Deng Kemler. is a 88 y.o. year old male who is a primary care patient of [name], DO. The care management team was consulted for assistance with disease management and care coordination needs.    Engaged with patient by telephone for follow up visit in response to provider referral for case management and/or care coordination services.   Consent to Services:  The patient was given information about Chronic Care Management services, agreed to services, and gave verbal consent prior to initiation of services.  Please see initial visit note for detailed documentation.   Patient agreed to services and verbal consent obtained.   Assessment: Review of patient past medical history, allergies, medications, health status, including review of consultants reports, laboratory and other test data, was performed as part of comprehensive evaluation and provision of chronic care management services.   SDOH (Social Determinants of Health) assessments and interventions performed:    CCM Care Plan  Allergies  Allergen Reactions   Sulfonamide Derivatives Other (See Comments)    Unknown - patient cannot remember reaction    Outpatient Encounter Medications as of 06/19/2022  Medication Sig Note   b complex vitamins tablet Take 1 tablet by mouth daily.    Calcium Carbonate-Vitamin D 600-400 MG-UNIT tablet Take 1 tablet by mouth daily.    clorazepate (TRANXENE) 15 MG tablet TAKE 1 TABLET BY MOUTH EVERYDAY AT BEDTIME    cromolyn (NASALCROM) 5.2 MG/ACT nasal spray Place 2 sprays into the nose 2 (two) times daily.    ELIQUIS 5 MG TABS tablet TAKE 1 TABLET BY MOUTH TWICE A DAY    fenofibrate 160 MG tablet TAKE 1 TABLET BY MOUTH EVERY DAY    fish oil-omega-3 fatty acids 1000 MG capsule Take 1 g by mouth 3 (three) times daily. 11/08/2021: Reports takes twice a day.    metoprolol succinate (TOPROL XL) 25 MG 24 hr tablet Take 0.5 tablets (12.5 mg total) by mouth daily.    Multiple Vitamins-Minerals (MULTIVITAMIN PO) Take 1 tablet by mouth daily.    Multiple Vitamins-Minerals (PRESERVISION AREDS 2) CAPS 2 po qd (Patient taking differently: Take 1 tablet by mouth in the morning and at bedtime.)    potassium chloride (KLOR-CON) 10 MEQ tablet Take 1 tablet (10 mEq total) by mouth daily as needed (take when you take torsemide).    simvastatin (ZOCOR) 10 MG tablet TAKE 1 TABLET BY MOUTH EVERYDAY AT BEDTIME    tamsulosin (FLOMAX) 0.4 MG CAPS capsule Take 0.4 mg by mouth at bedtime.    torsemide (DEMADEX) 20 MG tablet Take 1 tablet (20 mg total) by mouth daily as needed (if weight up 3 pounds overnight or 5 lbs from baseline). TAKE 1 TABLET BY MOUTH EVERY DAY    vitamin C (ASCORBIC ACID) 500 MG tablet Take 500 mg by mouth daily.    No facility-administered encounter medications on file as of 06/19/2022.    Patient Active Problem List   Diagnosis Date Noted   Chronic diastolic CHF (congestive heart failure) ([place]) 11/09/2021   Nonrheumatic mitral valve regurgitation 10/28/2021   Acute kidney injury superimposed on CKD ([place]) 17/40/8144   Acute diastolic CHF (congestive heart failure) ([place]) 10/25/2021   Secondary hypercoagulable state ([place]) 08/23/2021   Prostate CA ([place]) 07/28/2021   Thrombocytopenia ([place]) 03/30/2021   Screening for HIV (human immunodeficiency virus) 03/30/2021   Monocytosis 03/30/2021  Abnormal platelets ([place]) 03/28/2021   Hip pain 03/28/2021   Persistent atrial fibrillation ([facility])    Paroxysmal atrial fibrillation ([place]) 01/12/2018   Cystoid macular edema of right eye 11/29/2017   Macular degeneration 12/21/2015   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 eye with active choroidal neovascularization ([place]) 04/30/2014   Myalgia and myositis 11/16/2013   Epiretinal membrane 11/07/2012   Posterior vitreous detachment 11/07/2012   Status  post intraocular lens implant 11/07/2012   Fungal ear infection 04/09/2012   Abnormal laboratory test 03/27/2011   Aortic insufficiency 67/34/1937   SYSTOLIC MURMUR 90/24/0973   EDEMA 12/05/2010   ECCHYMOSES, SPONTANEOUS 11/28/2010   CONTUSION, RIGHT THIGH 11/28/2010   DECREASED HEARING, BILATERAL 09/23/2010   Pain in limb 07/28/2009   ANKLE SPRAIN, LEFT 07/28/2009   CONTUSION, LOWER LEG, LEFT 07/28/2009   DERMATITIS, CONTACT, NOS 10/02/2007   Essential hypertension 05/07/2007   PROSTATE CANCER, HX OF 03/13/2007   Hyperlipidemia 01/15/2007   Anxiety state 01/15/2007   DIVERTICULITIS, HX OF 01/15/2007    Conditions to be addressed/monitored:Atrial Fibrillation, CHF, and HTN  Care Plan : RN Care Manager Plan of Care  Updates made by Luretha Rued, RN since 06/19/2022 12:00 AM     Problem: Chronic disease Managment Education and/or Care Corodination needs (CHF/Atrial Fibrillation) Resolved 06/19/2022  Priority: High     Long-Range Goal: Development of Plan of Care for Chronic Disease Management (CHF/Atrial Fibrillation) Completed 06/19/2022  Start Date: 11/08/2021  Expected End Date: 09/13/2022  Priority: High  Note:   Current Barriers: 06/19/22 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 Denies any questions or concerns at this time. Discussed case closure and patient is in agreement. Positive feedback provided regarding management of health. Encouraged Mr. Ratajczak to contact provider for any care management needs in the future.  Knowledge Deficits related to plan of care for management of Atrial Fibrillation and CHF  Chronic Disease Management support and education needs related to Atrial Fibrillation and CHF  RNCM Clinical Goal(s):  Patient will verbalize understanding of plan for management of Atrial Fibrillation and CHF as evidenced by taking medications as prescribed, attending provider visit as scheduled, notifying provider with signs/symptoms of  exacerbation. verbalize basic understanding of Atrial Fibrillation and CHF disease process and self health management plan as evidenced by self report of managment take all medications exactly as prescribed and will call provider for medication related questions as evidenced by .    through collaboration with RN Care manager, provider, and care team.  Maintain and/or improved quality of life-the patient will demonstrate ongoing self health care management ability as evidenced by attending provider visits, taking medications as prescribed, continuing activities that encourage fulfullment in his life such as walking, gardening, outside activites.  Interventions: 1:1 collaboration with primary care provider regarding development and update of comprehensive plan of care as evidenced by provider attestation and co-signature Inter-disciplinary care team collaboration (see longitudinal plan of care) Evaluation of current treatment plan related to  self management and patient's adherence to plan as established by provider  Hypertension Interventions:  (Status:  Goal Met.) Long Term Goal Last practice recorded BP readings:  BP Readings from Last 3 Encounters:  02/17/22 108/72  02/13/22 130/72  02/10/22 (!) 120/55  Most recent eGFR/CrCl:  Lab Results  Component Value Date   EGFR 46 (L) 11/28/2021    No components found for: CRCL Encouraged to continue to keep track of and record blood pressure readings Provided positive feedback regarding self management of  health  Heart Failure Interventions: Long Term: Goal Met: Yes Discussed the importance of keeping all appointments with provider Discussed signs/symptoms of heart failure exacerbation Discussed the importance of remaining active Encouraged patient to call to schedule follow up appointment with cardiologist. Last visit 02/13/22 recommended follow up in 6 months follow up(has not been scheduled).  AFIB Interventions: Long term: Goal Met:  Yes Advised patient to discuss any questions or concerns with provider Encouraged to call to schedule follow up appointment with cardiologist. Last visit 02/13/22 recommended 6 month follow up-has not been scheduled-upcoming appointment scheduled for 8/23  Patient Goals/Self-Care Activities: Contact Provider with any health questions/concerns as needed  Continue to take medications as prescribed  Continue to check blood pressure and heart rate(pulse) as recommended by your provider and notify provider if out of recommended range. Call your cardiologist or primary care provider: signs/symptoms of worsening heart failure: weight gain of 3 pounds in a 24 hour period or 5 pounds in a week, increased swelling of stomach, feet/legs or hands. Increased shortness of breath, having less energy than normal, new or worsening dizziness, if it is harder for you to breathe when lying down and you need to sit up to breathe or you feel that something is not right. Contact your cardiologist for signs of atrial fibrillation exacerbation: racing heartbeat that may be uncomfortable, shortness of breath with or without chest pain, weakness(unusually tired or feeling faint, dizziness. Continue to remain active per provider recommendations Eat Healthy: whole grains, lean meats, fruits, vegetable, healthy fats, low salt diet    Plan:No further follow up required: RNCM confirmed patient has writer's contact number and Patient encouraged to contact primary care provider if care management services needed in the future.  Thea Silversmith, RN, MSN, BSN, CCM Care Management Coordinator [facility] [phone]

## 2022-06-26 DIAGNOSIS — H401122 Primary open-angle glaucoma, left eye, moderate stage: Secondary | ICD-10-CM | POA: Diagnosis not present

## 2022-07-03 ENCOUNTER — Telehealth: Payer: Medicare Other

## 2022-07-06 DIAGNOSIS — H353231 Exudative age-related macular degeneration, bilateral, with active choroidal neovascularization: Secondary | ICD-10-CM | POA: Diagnosis not present

## 2022-07-06 DIAGNOSIS — H3589 Other specified retinal disorders: Secondary | ICD-10-CM | POA: Diagnosis not present

## 2022-07-06 DIAGNOSIS — H35373 Puckering of macula, bilateral: Secondary | ICD-10-CM | POA: Diagnosis not present

## 2022-07-20 DIAGNOSIS — H401122 Primary open-angle glaucoma, left eye, moderate stage: Secondary | ICD-10-CM | POA: Diagnosis not present

## 2022-07-23 ENCOUNTER — Other Ambulatory Visit: Payer: Self-pay | Admitting: Family Medicine

## 2022-07-23 ENCOUNTER — Other Ambulatory Visit: Payer: Self-pay | Admitting: Cardiology

## 2022-07-23 DIAGNOSIS — F411 Generalized anxiety disorder: Secondary | ICD-10-CM

## 2022-07-24 NOTE — Telephone Encounter (Signed)
Rx(s) sent to pharmacy electronically.  

## 2022-07-24 NOTE — Telephone Encounter (Signed)
Requesting: clorazepate '15mg'$   Contract: 07/12/18 UDS: 02/10/22 Last Visit: 02/10/22 Next Visit: 08/10/22 Last Refill: 05/15/22 #30 and 1RF  Please Advise

## 2022-08-01 ENCOUNTER — Ambulatory Visit: Payer: Self-pay | Admitting: Licensed Clinical Social Worker

## 2022-08-01 NOTE — Patient Outreach (Signed)
  Care Coordination   Initial Visit Note   08/01/2022 Name: Mark Stark. MRN: [id number] DOB: Oct 05, 1928  Mark Stark. is a 88 y.o. year old male who sees [name], [name], DO for primary care. I spoke with  Mark Stark. by phone today  What matters to the patients health and wellness today?  Client wants to maintain current activity level. He wants to complete daily ADLs, wants to work in his garden, wants to socialize with friends, wants to drive to appointments.     Goals Addressed             This Visit's Progress    patient wants to maintain daily activities, completed ADLs, visit with friends, work in his garden, drive to appointments and remain as independent as possible       Care Coordination Interventions:  Depression screen reviewed  Discussed Care Coordination program support Discussed Chronic Care support received previously with RN Thea Silversmith Reviewed social interactions with client. He has numerous friends with whom he enjoys socializing.  Reviewed ambulation needs of client . He walks without device but has to take periodic rest breaks Discussed hobby of working in his garden for relaxation   Reviewed medication procurement of client Reviewed sleeping issues of client.  He said he sleeping very well Discussed work Designer, jewellery as Pharmacist, hospital at [employer]      SDOH assessments and interventions completed:  Yes  SDOH Interventions Today    Flowsheet Row Most Recent Value  SDOH Interventions   Stress Interventions Provide Counseling  [client has some stress related to managing medical needs]   Client may be at some risk for social isolation He does socialize with family and friends occasionally but does live alone    Care Coordination Interventions Activated:  Yes  Care Coordination Interventions:  Yes, provided   Follow up plan: Follow up call scheduled for 08/29/22 at 10:00 AM    Encounter Outcome:  Pt. Visit Completed

## 2022-08-01 NOTE — Patient Instructions (Addendum)
Visit Information  Thank you for taking time to visit with me today. Please don't hesitate to contact me if I can be of assistance to you before our next scheduled telephone appointment.  Following are the goals we discussed today:   Our next appointment is by telephone on 08/29/22 at 10:00 AM   Please call the care guide team at [phone] if you need to cancel or reschedule your appointment.   If you are experiencing a Mental Health or [place] or need someone to talk to, please go to [facility] [phone])   Following is a copy of your full plan of care:   Care Coordination Interventions:  Depression screen reviewed  Discussed Care Coordination program support Discussed Chronic Care support received previously with RN Thea Silversmith Reviewed social interactions with client. He has numerous friends with whom he enjoys socializing.  Reviewed ambulation needs of client . He walks without device but has to take periodic rest breaks Discussed hobby of working in his garden for relaxation   Reviewed medication procurement of client Reviewed sleeping issues of client.  He said he sleeping very well Discussed work Designer, jewellery as Pharmacist, hospital at [employer]   Mr. Nicol was given information about Care Management services by the embedded care coordination team including:  Care Management services include personalized support from designated clinical staff supervised by his physician, including individualized plan of care and coordination with other care providers 24/7 contact phone numbers for assistance for urgent and routine care needs. The patient may stop CCM services at any time (effective at the end of the month) by phone call to the office staff.  Patient agreed to services and verbal consent obtained.   [name].Json Koelzer MSW, [place] representative [facility] Care Management [phone]

## 2022-08-03 DIAGNOSIS — R3911 Hesitancy of micturition: Secondary | ICD-10-CM | POA: Diagnosis not present

## 2022-08-03 DIAGNOSIS — N401 Enlarged prostate with lower urinary tract symptoms: Secondary | ICD-10-CM | POA: Diagnosis not present

## 2022-08-03 DIAGNOSIS — R9721 Rising PSA following treatment for malignant neoplasm of prostate: Secondary | ICD-10-CM | POA: Diagnosis not present

## 2022-08-03 DIAGNOSIS — C61 Malignant neoplasm of prostate: Secondary | ICD-10-CM | POA: Diagnosis not present

## 2022-08-07 ENCOUNTER — Encounter (HOSPITAL_BASED_OUTPATIENT_CLINIC_OR_DEPARTMENT_OTHER): Payer: Self-pay | Admitting: *Deleted

## 2022-08-09 ENCOUNTER — Encounter (HOSPITAL_BASED_OUTPATIENT_CLINIC_OR_DEPARTMENT_OTHER): Payer: Self-pay

## 2022-08-09 DIAGNOSIS — L821 Other seborrheic keratosis: Secondary | ICD-10-CM | POA: Diagnosis not present

## 2022-08-09 DIAGNOSIS — D1801 Hemangioma of skin and subcutaneous tissue: Secondary | ICD-10-CM | POA: Diagnosis not present

## 2022-08-09 DIAGNOSIS — L570 Actinic keratosis: Secondary | ICD-10-CM | POA: Diagnosis not present

## 2022-08-09 DIAGNOSIS — K55069 Acute infarction of intestine, part and extent unspecified: Secondary | ICD-10-CM | POA: Insufficient documentation

## 2022-08-09 DIAGNOSIS — L728 Other follicular cysts of the skin and subcutaneous tissue: Secondary | ICD-10-CM | POA: Diagnosis not present

## 2022-08-09 DIAGNOSIS — N1831 Chronic kidney disease, stage 3a: Secondary | ICD-10-CM | POA: Insufficient documentation

## 2022-08-09 DIAGNOSIS — K5792 Diverticulitis of intestine, part unspecified, without perforation or abscess without bleeding: Secondary | ICD-10-CM | POA: Insufficient documentation

## 2022-08-09 DIAGNOSIS — L814 Other melanin hyperpigmentation: Secondary | ICD-10-CM | POA: Diagnosis not present

## 2022-08-10 ENCOUNTER — Encounter: Payer: Self-pay | Admitting: Family Medicine

## 2022-08-10 ENCOUNTER — Ambulatory Visit (INDEPENDENT_AMBULATORY_CARE_PROVIDER_SITE_OTHER): Payer: Medicare Other | Admitting: Family Medicine

## 2022-08-10 VITALS — BP 120/80 | HR 125 | Temp 97.7°F | Resp 18 | Ht 65.0 in | Wt 153.6 lb

## 2022-08-10 DIAGNOSIS — H3530 Unspecified macular degeneration: Secondary | ICD-10-CM

## 2022-08-10 DIAGNOSIS — I4819 Other persistent atrial fibrillation: Secondary | ICD-10-CM | POA: Diagnosis not present

## 2022-08-10 DIAGNOSIS — H353211 Exudative age-related macular degeneration, right eye, with active choroidal neovascularization: Secondary | ICD-10-CM | POA: Diagnosis not present

## 2022-08-10 DIAGNOSIS — I5032 Chronic diastolic (congestive) heart failure: Secondary | ICD-10-CM | POA: Diagnosis not present

## 2022-08-10 DIAGNOSIS — I10 Essential (primary) hypertension: Secondary | ICD-10-CM

## 2022-08-10 DIAGNOSIS — C61 Malignant neoplasm of prostate: Secondary | ICD-10-CM

## 2022-08-10 DIAGNOSIS — E785 Hyperlipidemia, unspecified: Secondary | ICD-10-CM

## 2022-08-10 DIAGNOSIS — F411 Generalized anxiety disorder: Secondary | ICD-10-CM

## 2022-08-10 LAB — COMPREHENSIVE METABOLIC PANEL
ALT: 21 U/L (ref 0–53)
AST: 32 U/L (ref 0–37)
Albumin: 4.1 g/dL (ref 3.5–5.2)
Alkaline Phosphatase: 42 U/L (ref 39–117)
BUN: 22 mg/dL (ref 6–23)
CO2: 29 mEq/L (ref 19–32)
Calcium: 9 mg/dL (ref 8.4–10.5)
Chloride: 99 mEq/L (ref 96–112)
Creatinine, Ser: 1.09 mg/dL (ref 0.40–1.50)
GFR: 59.08 mL/min — ABNORMAL LOW (ref >60.00)
Glucose, Bld: 81 mg/dL (ref 70–99)
Potassium: 4.7 mEq/L (ref 3.5–5.1)
Sodium: 135 mEq/L (ref 135–145)
Total Bilirubin: 1.3 mg/dL — ABNORMAL HIGH (ref 0.2–1.2)
Total Protein: 6.4 g/dL (ref 6.0–8.3)

## 2022-08-10 LAB — CBC WITH DIFFERENTIAL/PLATELET
Basophils Absolute: 0 10*3/uL (ref 0.0–0.1)
Basophils Relative: 0.5 % (ref 0.0–3.0)
Eosinophils Absolute: 0.1 10*3/uL (ref 0.0–0.7)
Eosinophils Relative: 1.1 % (ref 0.0–5.0)
HCT: 44 % (ref 39.0–52.0)
Hemoglobin: 14.5 g/dL (ref 13.0–17.0)
Lymphocytes Relative: 29 % (ref 12.0–46.0)
Lymphs Abs: 1.8 10*3/uL (ref 0.7–4.0)
MCHC: 32.9 g/dL (ref 30.0–36.0)
MCV: 94.5 fl (ref 78.0–100.0)
Monocytes Absolute: 1 10*3/uL (ref 0.1–1.0)
Monocytes Relative: 16.1 % — ABNORMAL HIGH (ref 3.0–12.0)
Neutro Abs: 3.4 10*3/uL (ref 1.4–7.7)
Neutrophils Relative %: 53.3 % (ref 43.0–77.0)
Platelets: 132 10*3/uL — ABNORMAL LOW (ref 150.0–400.0)
RBC: 4.66 Mil/uL (ref 4.22–5.81)
RDW: 15.6 % — ABNORMAL HIGH (ref 11.5–15.5)
WBC: 6.4 10*3/uL (ref 4.0–10.5)

## 2022-08-10 LAB — LIPID PANEL
Cholesterol: 108 mg/dL (ref 0–200)
HDL: 47.6 mg/dL (ref >39.00)
LDL Cholesterol: 48 mg/dL (ref 0–99)
NonHDL: 60.35
Total CHOL/HDL Ratio: 2
Triglycerides: 61 mg/dL (ref 0.0–149.0)
VLDL: 12.2 mg/dL (ref 0.0–40.0)

## 2022-08-10 MED ORDER — CLORAZEPATE DIPOTASSIUM 15 MG PO TABS: ORAL_TABLET | ORAL | 1 refills | Status: DC

## 2022-08-10 NOTE — Assessment & Plan Note (Signed)
Well controlled, no changes to meds. Encouraged heart healthy diet such as the DASH diet and exercise as tolerated.  °

## 2022-08-10 NOTE — Assessment & Plan Note (Signed)
Per oph

## 2022-08-10 NOTE — Assessment & Plan Note (Signed)
Stable  On tranxene-- he will try to cut in half

## 2022-08-10 NOTE — Assessment & Plan Note (Signed)
Encourage heart healthy diet such as MIND or DASH diet, increase exercise, avoid trans fats, simple carbohydrates and processed foods, consider a krill or fish or flaxseed oil cap daily.  °

## 2022-08-10 NOTE — Patient Instructions (Signed)
Cholesterol Content in Foods ?Cholesterol is a waxy, fat-like substance that helps to carry fat in the blood. The body needs cholesterol in small amounts, but too much cholesterol can cause damage to the arteries and heart. ?What foods have cholesterol? ? ?Cholesterol is found in animal-based foods, such as meat, seafood, and dairy. Generally, low-fat dairy and lean meats have less cholesterol than full-fat dairy and fatty meats. The milligrams of cholesterol per serving (mg per serving) of common cholesterol-containing foods are listed below. ?Meats and other proteins ?Egg -- one large whole egg has 186 mg. ?Veal shank -- 4 oz (113 g) has 141 mg. ?Lean ground turkey (93% lean) -- 4 oz (113 g) has 118 mg. ?Fat-trimmed lamb loin -- 4 oz (113 g) has 106 mg. ?Lean ground beef (90% lean) -- 4 oz (113 g) has 100 mg. ?Lobster -- 3.5 oz (99 g) has 90 mg. ?Pork loin chops -- 4 oz (113 g) has 86 mg. ?Canned salmon -- 3.5 oz (99 g) has 83 mg. ?Fat-trimmed beef top loin -- 4 oz (113 g) has 78 mg. ?Frankfurter -- 1 frank (3.5 oz or 99 g) has 77 mg. ?Crab -- 3.5 oz (99 g) has 71 mg. ?Roasted chicken without skin, white meat -- 4 oz (113 g) has 66 mg. ?Light bologna -- 2 oz (57 g) has 45 mg. ?Deli-cut turkey -- 2 oz (57 g) has 31 mg. ?Canned tuna -- 3.5 oz (99 g) has 31 mg. ?Bacon -- 1 oz (28 g) has 29 mg. ?Oysters and mussels (raw) -- 3.5 oz (99 g) has 25 mg. ?Mackerel -- 1 oz (28 g) has 22 mg. ?Trout -- 1 oz (28 g) has 20 mg. ?Pork sausage -- 1 link (1 oz or 28 g) has 17 mg. ?Salmon -- 1 oz (28 g) has 16 mg. ?Tilapia -- 1 oz (28 g) has 14 mg. ?Dairy ?Soft-serve ice cream -- ? cup (4 oz or 86 g) has 103 mg. ?Whole-milk yogurt -- 1 cup (8 oz or 245 g) has 29 mg. ?Cheddar cheese -- 1 oz (28 g) has 28 mg. ?American cheese -- 1 oz (28 g) has 28 mg. ?Whole milk -- 1 cup (8 oz or 250 mL) has 23 mg. ?2% milk -- 1 cup (8 oz or 250 mL) has 18 mg. ?Cream cheese -- 1 tablespoon (Tbsp) (14.5 g) has 15 mg. ?Cottage cheese -- ? cup (4 oz or  113 g) has 14 mg. ?Low-fat (1%) milk -- 1 cup (8 oz or 250 mL) has 10 mg. ?Sour cream -- 1 Tbsp (12 g) has 8.5 mg. ?Low-fat yogurt -- 1 cup (8 oz or 245 g) has 8 mg. ?Nonfat Greek yogurt -- 1 cup (8 oz or 228 g) has 7 mg. ?Half-and-half cream -- 1 Tbsp (15 mL) has 5 mg. ?Fats and oils ?Cod liver oil -- 1 tablespoon (Tbsp) (13.6 g) has 82 mg. ?Butter -- 1 Tbsp (14 g) has 15 mg. ?Lard -- 1 Tbsp (12.8 g) has 14 mg. ?Bacon grease -- 1 Tbsp (12.9 g) has 14 mg. ?Mayonnaise -- 1 Tbsp (13.8 g) has 5-10 mg. ?Margarine -- 1 Tbsp (14 g) has 3-10 mg. ?The items listed above may not be a complete list of foods with cholesterol. Exact amounts of cholesterol in these foods may vary depending on specific ingredients and brands. Contact a dietitian for more information. ?What foods do not have cholesterol? ?Most plant-based foods do not have cholesterol unless you combine them with a food that has   cholesterol. Foods without cholesterol include: ?Grains and cereals. ?Vegetables. ?Fruits. ?Vegetable oils, such as olive, canola, and sunflower oil. ?Legumes, such as peas, beans, and lentils. ?Nuts and seeds. ?Egg whites. ?The items listed above may not be a complete list of foods that do not have cholesterol. Contact a dietitian for more information. ?Summary ?The body needs cholesterol in small amounts, but too much cholesterol can cause damage to the arteries and heart. ?Cholesterol is found in animal-based foods, such as meat, seafood, and dairy. Generally, low-fat dairy and lean meats have less cholesterol than full-fat dairy and fatty meats. ?This information is not intended to replace advice given to you by your health care provider. Make sure you discuss any questions you have with your health care provider. ?Document Revised: 04/22/2021 Document Reviewed: 04/22/2021 ?Elsevier Patient Education ? 2023 [employer] ? ?

## 2022-08-10 NOTE — Progress Notes (Addendum)
Subjective:   By signing my name below, I, Mark Stark, attest that this documentation has been prepared under the direction and in the presence of [name], DO  08/10/2022    Patient ID: Mark Riles., male    DOB: [birth year], 88 y.o.   MRN: [id number]  Chief Complaint  Patient presents with   Hyperlipidemia   Follow-up    Hyperlipidemia Pertinent negatives include no chest pain or shortness of breath.   Patient is in today for a follow up visit.   His heart rate is elevated during this visit. He does not notice his heart rate is elevated. He continues following up with his cardiologist regularly.  He continues having back pain from his arthritis. He is managing the pain on his own at this time. He continues taking 25 mg metoprolol succinate, 20 mg torsemide and reports no new issues while taking them.  BP Readings from Last 3 Encounters:  08/10/22 120/80  02/17/22 108/72  02/13/22 130/72   Pulse Readings from Last 3 Encounters:  08/10/22 (!) 125  02/17/22 (!) 102  02/13/22 82   He reports being diagnosed with Glaucoma recently. He is following up with his eye doctor regularly. Otherwise he reports feeling good. He continues exercising regularly by doing yard work. He is measuring his blood pressure for the past couple of weeks regularly at home and reports his systolic blood pressure ranges from 546-270 and his diastolic blood pressure ranges from 77-97.  He continues taking 15 mg clorazepate regularly. 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 Now he takes it at night to help him sleep. He is interested in lowering his dosage and monitoring his sleep. If his sleep worsens he is willing to resume his 15 mg dosage.  He is planning on receiving the upcomming flu vaccine this fall season. He reports receiving 6 Covid-19 vaccines in total.    Past Medical History:  Diagnosis Date   Acute diastolic CHF (congestive heart  failure) (HCC) 10/25/2021   Anxiety    Aortic insufficiency    Atrial fibrillation (HCC)    Chronic diastolic CHF (congestive heart failure) (HCC)    Chronic kidney disease, stage 3a (HCC)    Diverticulitis    Dyslipidemia    Hypertension    Inferior mesenteric vein thrombosis (HCC)    Mitral regurgitation    Nonrheumatic mitral valve regurgitation 10/28/2021   Paroxysmal atrial fibrillation ([place]) 01/12/2018   Persistent atrial fibrillation ([place])    Prostate cancer [facility])     Past Surgical History:  Procedure Laterality Date   CARDIOVERSION N/A 03/01/2018   Procedure: CARDIOVERSION;  Surgeon: Troy Sine, MD;  Location: [place];  Service: Cardiovascular;  Laterality: N/A;   CARDIOVERSION N/A 08/26/2021   Procedure: CARDIOVERSION;  Surgeon: Pixie Casino, MD;  Location: [place];  Service: Cardiovascular;  Laterality: N/A;   CARDIOVERSION N/A 12/06/2021   Procedure: CARDIOVERSION;  Surgeon: Donato Heinz, MD;  Location: [place];  Service: Cardiovascular;  Laterality: N/A;   CATARACT EXTRACTION  01/27/2010   right   CATARACT EXTRACTION  03/04/2010   left   Prostate seed implants     PROSTATE SURGERY      Family History  Problem Relation Age of Onset   Dementia Mother    Alzheimer's disease Mother    Mental illness Mother        ALZ DISEASE   Cancer Brother 54  pancreatic cancer    Social History   Socioeconomic History   Marital status: Widowed    Spouse name: Not on file   Number of children: Not on file   Years of education: Not on file   Highest education level: Not on file  Occupational History   Occupation: Professor---RETIRED    Employer: RETIRED    Comment: Retired  Tobacco Use   Smoking status: Never   Smokeless tobacco: Never  Vaping Use   Vaping Use: Never used  Substance and Sexual Activity   Alcohol use: No    Comment: Little wine occasionally    Drug use: No   Sexual activity: Never    Partners: Female  Other  Topics Concern   Not on file  Social History Narrative   Not on file   Social Determinants of Health   Financial Resource Strain: Low Risk  (02/17/2022)   Overall Financial Resource Strain (CARDIA)    Difficulty of Paying Living Expenses: Not hard at all  Food Insecurity: No Food Insecurity (02/17/2022)   Hunger Vital Sign    Worried About Running Out of Food in the Last Year: Never true    [place] in the Last Year: Never true  Transportation Needs: No Transportation Needs (02/17/2022)   PRAPARE - Hydrologist (Medical): No    Lack of Transportation (Non-Medical): No  Physical Activity: Sufficiently Active (02/17/2022)   Exercise Vital Sign    Days of Exercise per Week: 7 days    Minutes of Exercise per Session: 40 min  Stress: Stress Concern Present (08/01/2022)   [place]    Feeling of Stress : To some extent  Social Connections: Moderately Isolated (02/17/2022)   Social Connection and Isolation Panel [NHANES]    Frequency of Communication with Friends and Family: More than three times a week    Frequency of Social Gatherings with Friends and Family: Twice a week    Attends Religious Services: More than 4 times per year    Active Member of [employer] or Organizations: No    Attends Archivist Meetings: Never    Marital Status: Widowed  Intimate Partner Violence: Not At Risk (02/17/2022)   Humiliation, Afraid, Rape, and Kick questionnaire    Fear of Current or Ex-Partner: No    Emotionally Abused: No    Physically Abused: No    Sexually Abused: No    Outpatient Medications Prior to Visit  Medication Sig Dispense Refill   b complex vitamins tablet Take 1 tablet by mouth daily.     Calcium Carbonate-Vitamin D 600-400 MG-UNIT tablet Take 1 tablet by mouth daily.     cromolyn (NASALCROM) 5.2 MG/ACT nasal spray Place 2 sprays into the nose 2 (two) times daily.     ELIQUIS 5  MG TABS tablet TAKE 1 TABLET BY MOUTH TWICE A DAY 180 tablet 1   fenofibrate 160 MG tablet TAKE 1 TABLET BY MOUTH EVERY DAY 90 tablet 1   fish oil-omega-3 fatty acids 1000 MG capsule Take 1 g by mouth 3 (three) times daily.     metoprolol succinate (TOPROL XL) 25 MG 24 hr tablet Take 0.5 tablets (12.5 mg total) by mouth daily. 45 tablet 3   Multiple Vitamins-Minerals (MULTIVITAMIN PO) Take 1 tablet by mouth daily.     Multiple Vitamins-Minerals (PRESERVISION AREDS 2) CAPS 2 po qd (Patient taking differently: Take 1 tablet by mouth in the morning  and at bedtime.)     potassium chloride (KLOR-CON) 10 MEQ tablet Take 1 tablet (10 mEq total) by mouth daily as needed (take when you take torsemide). 90 tablet 1   simvastatin (ZOCOR) 10 MG tablet TAKE 1 TABLET BY MOUTH EVERYDAY AT BEDTIME 90 tablet 2   tamsulosin (FLOMAX) 0.4 MG CAPS capsule Take 0.4 mg by mouth at bedtime.     torsemide (DEMADEX) 20 MG tablet Take 1 tablet (20 mg total) by mouth daily as needed (if weight up 3 pounds overnight or 5 lbs from baseline). TAKE 1 TABLET BY MOUTH EVERY DAY 90 tablet 1   vitamin C (ASCORBIC ACID) 500 MG tablet Take 500 mg by mouth daily.     clorazepate (TRANXENE) 15 MG tablet TAKE 1 TABLET BY MOUTH EVERYDAY AT BEDTIME 30 tablet 1   No facility-administered medications prior to visit.    Allergies  Allergen Reactions   Sulfonamide Derivatives Other (See Comments)    Unknown - patient cannot remember reaction    Review of Systems  Constitutional:  Negative for fever and malaise/fatigue.  HENT:  Negative for congestion.   Eyes:  Negative for blurred vision.  Respiratory:  Negative for shortness of breath.   Cardiovascular:  Negative for chest pain, palpitations and leg swelling.  Gastrointestinal:  Negative for abdominal pain, blood in stool and nausea.  Genitourinary:  Negative for dysuria and frequency.  Musculoskeletal:  Negative for falls.  Skin:  Negative for rash.  Neurological:  Negative for  dizziness, loss of consciousness and headaches.  Endo/Heme/Allergies:  Negative for environmental allergies.  Psychiatric/Behavioral:  Negative for depression. The patient is not nervous/anxious.        Objective:    Physical Exam Vitals and nursing note reviewed.  Constitutional:      General: He is not in acute distress.    Appearance: Normal appearance. He is not ill-appearing.  HENT:     Head: Normocephalic and atraumatic.     Right Ear: External ear normal.     Left Ear: External ear normal.  Eyes:     Extraocular Movements: Extraocular movements intact.     Pupils: Pupils are equal, round, and reactive to light.  Cardiovascular:     Rate and Rhythm: Normal rate and regular rhythm.     Heart sounds: Murmur heard.     No gallop.  Pulmonary:     Effort: Pulmonary effort is normal. No respiratory distress.     Breath sounds: Normal breath sounds. No wheezing or rales.  Skin:    General: Skin is warm and dry.  Neurological:     Mental Status: He is alert and oriented to person, place, and time.  Psychiatric:        Judgment: Judgment normal.     BP 120/80 (BP Location: Left Arm, Patient Position: Sitting, Cuff Size: Normal)   Pulse (!) 125   Temp 97.7 F (36.5 C) (Oral)   Resp 18   Ht 5' 5"  (1.651 m)   Wt 153 lb 9.6 oz (69.7 kg)   SpO2 95%   BMI 25.56 kg/m  Wt Readings from Last 3 Encounters:  08/10/22 153 lb 9.6 oz (69.7 kg)  02/17/22 153 lb 3.2 oz (69.5 kg)  02/13/22 153 lb 9.6 oz (69.7 kg)    Diabetic Foot Exam - Simple   No data filed    Lab Results  Component Value Date   WBC 6.0 02/10/2022   HGB 13.0 (L) 02/10/2022   HCT 38.8 02/10/2022  PLT 174 02/10/2022   GLUCOSE 80 02/10/2022   CHOL 99 02/10/2022   TRIG 43 02/10/2022   HDL 51 02/10/2022   LDLCALC 36 02/10/2022   ALT 19 02/10/2022   AST 29 02/10/2022   NA 135 02/10/2022   K 4.7 02/10/2022   CL 102 02/10/2022   CREATININE 1.30 (H) 02/10/2022   BUN 23 02/10/2022   CO2 26 02/10/2022    TSH 2.517 10/25/2021   PSA 0.35 12/27/2009   INR 1.2 02/27/2018   HGBA1C 5.0 05/21/2012   MICROALBUR <0.7 02/07/2021    Lab Results  Component Value Date   TSH 2.517 10/25/2021   Lab Results  Component Value Date   WBC 6.0 02/10/2022   HGB 13.0 (L) 02/10/2022   HCT 38.8 02/10/2022   MCV 94.9 02/10/2022   PLT 174 02/10/2022   Lab Results  Component Value Date   NA 135 02/10/2022   K 4.7 02/10/2022   CO2 26 02/10/2022   GLUCOSE 80 02/10/2022   BUN 23 02/10/2022   CREATININE 1.30 (H) 02/10/2022   BILITOT 0.9 02/10/2022   ALKPHOS 34 (L) 10/25/2021   AST 29 02/10/2022   ALT 19 02/10/2022   PROT 5.7 (L) 02/10/2022   ALBUMIN 3.4 (L) 10/25/2021   CALCIUM 9.2 02/10/2022   ANIONGAP 7 10/28/2021   EGFR 46 (L) 11/28/2021   GFR 57.00 (L) 07/28/2021   Lab Results  Component Value Date   CHOL 99 02/10/2022   Lab Results  Component Value Date   HDL 51 02/10/2022   Lab Results  Component Value Date   LDLCALC 36 02/10/2022   Lab Results  Component Value Date   TRIG 43 02/10/2022   Lab Results  Component Value Date   CHOLHDL 1.9 02/10/2022   Lab Results  Component Value Date   HGBA1C 5.0 05/21/2012       Assessment & Plan:   Problem List Items Addressed This Visit       Unprioritized   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 eye with active choroidal neovascularization ([place])   Prostate CA ([place])   Relevant Medications   clorazepate (TRANXENE) 15 MG tablet   Persistent atrial fibrillation ([place])    Per cardiology      Macular degeneration    Per oph      Hyperlipidemia    Encourage heart healthy diet such as MIND or DASH diet, increase exercise, avoid trans fats, simple carbohydrates and processed foods, consider a krill or fish or flaxseed oil cap daily.       Relevant Orders   CBC with Differential/Platelet   Comprehensive metabolic panel   Lipid panel   Essential hypertension - Primary    Well controlled, no changes to meds.  Encouraged heart healthy diet such as the DASH diet and exercise as tolerated.       Relevant Orders   CBC with Differential/Platelet   Comprehensive metabolic panel   Lipid panel   Anxiety state    Stable  On tranxene-- he will try to cut in half       Relevant Medications   clorazepate (TRANXENE) 15 MG tablet   Other Visit Diagnoses     Chronic diastolic heart failure (HCC)       Generalized anxiety disorder       Relevant Medications   clorazepate (TRANXENE) 15 MG tablet        Meds ordered this encounter  Medications   clorazepate (TRANXENE) 15 MG tablet    Sig: TAKE  1 TABLET BY MOUTH EVERYDAY AT BEDTIME    Dispense:  30 tablet    Refill:  1    Not to exceed 4 additional fills before 11/11/2022.    IAnn Held, DO, personally preformed the services described in this documentation.  All medical record entries made by the scribe were at my direction and in my presence.  I have reviewed the chart and discharge instructions (if applicable) and agree that the record reflects my personal performance and is accurate and complete. 08/10/2022   I,Mark Stark,acting as a scribe for [name], DO.,have documented all relevant documentation on the behalf of [name], DO,as directed by  [name], DO while in the presence of [name], DO.   [name], DO

## 2022-08-10 NOTE — Assessment & Plan Note (Signed)
Per cardiology 

## 2022-08-14 ENCOUNTER — Encounter (HOSPITAL_BASED_OUTPATIENT_CLINIC_OR_DEPARTMENT_OTHER): Payer: Self-pay | Admitting: Cardiology

## 2022-08-14 ENCOUNTER — Ambulatory Visit (INDEPENDENT_AMBULATORY_CARE_PROVIDER_SITE_OTHER): Payer: Medicare Other | Admitting: Cardiology

## 2022-08-14 VITALS — BP 128/76 | HR 114 | Ht 66.0 in | Wt 152.6 lb

## 2022-08-14 DIAGNOSIS — I340 Nonrheumatic mitral (valve) insufficiency: Secondary | ICD-10-CM

## 2022-08-14 DIAGNOSIS — D6869 Other thrombophilia: Secondary | ICD-10-CM | POA: Diagnosis not present

## 2022-08-14 DIAGNOSIS — I5032 Chronic diastolic (congestive) heart failure: Secondary | ICD-10-CM | POA: Diagnosis not present

## 2022-08-14 DIAGNOSIS — I4821 Permanent atrial fibrillation: Secondary | ICD-10-CM

## 2022-08-14 DIAGNOSIS — E782 Mixed hyperlipidemia: Secondary | ICD-10-CM

## 2022-08-14 NOTE — Patient Instructions (Signed)

## 2022-08-14 NOTE — Progress Notes (Signed)
Cardiology Office Note:    Date:  08/15/2022   ID:  Mark Riles., DOB 08/24/1928, MRN [id number]  PCP:  [name], [name], DO  Cardiologist:  Buford Dresser, MD  Referring MD: [name], [name], *   CC: Follow up  History of Present Illness:    Mark Stark. is a 88 y.o. male with a hx of atrial fibrillation, chronic diastolic CHF, CKD stage 3a, hypertension, dyslipidemia, aortic insufficiency, mitral regurgitation, inferior mesenteric vein thrombosis, prostate cancer, and anxiety, who is seen for follow up.   Cardiovascular history: On presentation to the hospital 10/25/2021 he complained of worsening shortness of breath, LE edema, and orthopnea. CXR revealed small bilateral effusions and mild interstitial edema. He was admitted for congestive heart failure.  At his last appointment he presented a BP log showing mostly well controlled readings. His swelling was managed with compression stockings and his at home weight was stable. He remained active with yard work with purposeful rests after an hour of work. He continued to follow a low-sodium diet.  Today: He states that he feels great, relatively speaking. In clinic today his heart rate was 132 bpm with initial vitals, improved to 114 bpm on his EKG, and then dropped to 96 bpm at physical exam. His blood pressure was elevated at 146/100, but decreased to 128/76 on recheck. He notes being lost on the way to his visit today, and he endorsed white coat hypertension. He denies feeling any symptoms. Per his log, his at home blood pressures have ranged 107/77 - 134/82. His heart rate ranges from the 60's to 110's.  He continues to stay active. He completed 30 minutes of fast walking and 30 minutes of mowing the lawn this morning with no anginal symptoms.  His LE edema has still been an issue. He has tried wearing compression socks but was unable to tolerate them. Lately he has not needed to take his torsemide as his weight  remains stable.  It has been difficult to follow a low sodium diet, but he continues to work on this. Typically he makes sure to drink a lot of water. He also enjoys tomato juice.  He denies any palpitations, chest pain, shortness of breath. No lightheadedness, headaches, syncope, orthopnea, or PND.   Past Medical History:  Diagnosis Date   Acute diastolic CHF (congestive heart failure) (HCC) 10/25/2021   Anxiety    Aortic insufficiency    Atrial fibrillation (HCC)    Chronic diastolic CHF (congestive heart failure) (HCC)    Chronic kidney disease, stage 3a (HCC)    Diverticulitis    Dyslipidemia    Hypertension    Inferior mesenteric vein thrombosis (HCC)    Mitral regurgitation    Nonrheumatic mitral valve regurgitation 10/28/2021   Paroxysmal atrial fibrillation ([place]) 01/12/2018   Persistent atrial fibrillation ([place])    Prostate cancer [facility])     Past Surgical History:  Procedure Laterality Date   CARDIOVERSION N/A 03/01/2018   Procedure: CARDIOVERSION;  Surgeon: Troy Sine, MD;  Location: [place];  Service: Cardiovascular;  Laterality: N/A;   CARDIOVERSION N/A 08/26/2021   Procedure: CARDIOVERSION;  Surgeon: Pixie Casino, MD;  Location: [place];  Service: Cardiovascular;  Laterality: N/A;   CARDIOVERSION N/A 12/06/2021   Procedure: CARDIOVERSION;  Surgeon: Donato Heinz, MD;  Location: [place];  Service: Cardiovascular;  Laterality: N/A;   CATARACT EXTRACTION  01/27/2010   right   CATARACT EXTRACTION  03/04/2010   left  Prostate seed implants     PROSTATE SURGERY      Current Medications: Current Outpatient Medications on File Prior to Visit  Medication Sig   b complex vitamins tablet Take 1 tablet by mouth daily.   Calcium Carbonate-Vitamin D 600-400 MG-UNIT tablet Take 1 tablet by mouth daily.   clorazepate (TRANXENE) 15 MG tablet TAKE 1 TABLET BY MOUTH EVERYDAY AT BEDTIME   cromolyn (NASALCROM) 5.2 MG/ACT nasal spray Place 2 sprays  into the nose 2 (two) times daily.   ELIQUIS 5 MG TABS tablet TAKE 1 TABLET BY MOUTH TWICE A DAY   fenofibrate 160 MG tablet TAKE 1 TABLET BY MOUTH EVERY DAY   fish oil-omega-3 fatty acids 1000 MG capsule Take 1 g by mouth 3 (three) times daily.   latanoprost (XALATAN) 0.005 % ophthalmic solution Place 1 drop into the left eye at bedtime.   metoprolol succinate (TOPROL XL) 25 MG 24 hr tablet Take 0.5 tablets (12.5 mg total) by mouth daily.   Multiple Vitamins-Minerals (MULTIVITAMIN PO) Take 1 tablet by mouth daily.   Multiple Vitamins-Minerals (PRESERVISION AREDS 2) CAPS 2 po qd (Patient taking differently: Take 1 tablet by mouth in the morning and at bedtime.)   potassium chloride (KLOR-CON) 10 MEQ tablet Take 1 tablet (10 mEq total) by mouth daily as needed (take when you take torsemide).   simvastatin (ZOCOR) 10 MG tablet TAKE 1 TABLET BY MOUTH EVERYDAY AT BEDTIME   tamsulosin (FLOMAX) 0.4 MG CAPS capsule Take 0.4 mg by mouth at bedtime.   torsemide (DEMADEX) 20 MG tablet Take 1 tablet (20 mg total) by mouth daily as needed (if weight up 3 pounds overnight or 5 lbs from baseline). TAKE 1 TABLET BY MOUTH EVERY DAY   vitamin C (ASCORBIC ACID) 500 MG tablet Take 500 mg by mouth daily.   No current facility-administered medications on file prior to visit.     Allergies:   Sulfonamide derivatives   Social History   Tobacco Use   Smoking status: Never   Smokeless tobacco: Never  Vaping Use   Vaping Use: Never used  Substance Use Topics   Alcohol use: No    Comment: Little wine occasionally    Drug use: No    Family History: family history includes Alzheimer's disease in his mother; Cancer (age of onset: 101) in his brother; Dementia in his mother; Mental illness in his mother.  ROS:   Please see the history of present illness. (+) Bilateral LE edema All other systems are reviewed and negative.    EKGs/Labs/Other Studies Reviewed:    The following studies were reviewed  today:  Echo 10/26/2021:  1. Left ventricular ejection fraction, by estimation, is 50 to 55%. The  left ventricle has normal function. The left ventricle has no regional  wall motion abnormalities. There is mild left ventricular hypertrophy.  Left ventricular diastolic parameters  are indeterminate.   2. Right ventricular systolic function is normal. The right ventricular  size is normal. There is moderately elevated pulmonary artery systolic  pressure.   3. Left atrial size was moderately dilated.   4. Posterior leaflet appears proplapsed. MR is moderate to severe, the  jet is eccentric and may underestiamte the severity.   5. Aortic valve is moderately calcified. The aortic valve is normal in  structure. Aortic valve regurgitation is moderate. No aortic stenosis is  present.   6. The inferior vena cava is dilated in size with >50% respiratory  variability, suggesting right atrial pressure of 8  mmHg.   Comparison(s): No prior Echocardiogram.   Conclusion(s)/Recommendation(s): Patient has moderate to severe mitral  regurgitation; consider TEE if clinically indicated.   Monitor 01/21/2018: Atrial fib Rare 2 - 3 second pauses.  EKG:  EKG is personally reviewed.   08/14/2022:  atrial fibrillation at 114 bpm 02/13/2022: atrial fibrillation at 82 bpm 01/03/2022: EKG was not ordered. 11/07/2021: atrial fibrillation at 66 bpm  Recent Labs: 10/25/2021: B Natriuretic Peptide 623.5; Magnesium 1.8; TSH 2.517 08/10/2022: ALT 21; BUN 22; Creatinine, Ser 1.09; Hemoglobin 14.5; Platelets 132.0; Potassium 4.7; Sodium 135   Recent Lipid Panel    Component Value Date/Time   CHOL 108 08/10/2022 1344   TRIG 61.0 08/10/2022 1344   HDL 47.60 08/10/2022 1344   CHOLHDL 2 08/10/2022 1344   VLDL 12.2 08/10/2022 1344   LDLCALC 48 08/10/2022 1344   LDLCALC 36 02/10/2022 1401    Physical Exam:    VS:  BP 128/76 (BP Location: Right Arm, Patient Position: Sitting, Cuff Size: Normal)   Pulse (!) 114    Ht '5\' 6"'$  (1.676 m)   Wt 152 lb 9.6 oz (69.2 kg)   BMI 24.63 kg/m     Wt Readings from Last 3 Encounters:  08/14/22 152 lb 9.6 oz (69.2 kg)  08/10/22 153 lb 9.6 oz (69.7 kg)  02/17/22 153 lb 3.2 oz (69.5 kg)    GEN: Well nourished, well developed in no acute distress HEENT: Normal, moist mucous membranes NECK: No JVD CARDIAC: irregularly irregular rhythm, normal S1 and S2, no rubs or gallops. 1/6 systolic murmur. VASCULAR: Radial and DP pulses 2+ bilaterally. No carotid bruits RESPIRATORY:  Clear to auscultation without rales, wheezing or rhonchi  ABDOMEN: Soft, non-tender, non-distended MUSCULOSKELETAL:  Ambulates independently SKIN: Warm and dry, 1+ LE edema bilaterally NEUROLOGIC:  Alert and oriented x 3. No focal neuro deficits noted. PSYCHIATRIC:  Normal affect    ASSESSMENT:    1. Permanent atrial fibrillation ([place])   2. Chronic diastolic CHF (congestive heart failure) ([place])   3. Secondary hypercoagulable state ([place])   4. Nonrheumatic mitral valve regurgitation   5. Mixed hyperlipidemia      PLAN:    Chronic diastolic heart failure Moderate to severe mitral regurgitation when volume overloaded -NYHA class 3 on admission, now NYHA class I-II -echo with low normal EF, moderate to severe MR at peak congestion -murmur much quieter, suspect MR now improved -weights stable -continue metoprolol 12.5 mg daily, avoid bradycardia but also avoid tachycardia with afib -continue torsemide 20 mg PRN weight gain 3 lbs (rarely requires)   Atrial fibrillation, now permanent -CHA2DS2/VAS Stroke Risk Points=4  -continue apixaban 5 mg BID. Meets age criteria, monitor renal function. Weight is >60 kg -did not hold rhythm with cardioversion or amiodarone followed by cardioversion. Asymptomatic. No further plans for rhythm control -we discussed that activity, etc will raise HR, as evidenced today. It does decrease with rest. We discussed that it is a balance between optimizing heart  rate and preventing hypotension. Discussed options, we will continue current dose of metoprolol    Hyperlipidemia -continue simvastatin, reasonable for age -on fenofibrate as well  Cardiac risk counseling and prevention recommendations: -recommend heart healthy/Mediterranean diet, with whole grains, fruits, vegetable, fish, lean meats, nuts, and olive oil. Limit salt. -recommend moderate walking, 3-5 times/week for 30-50 minutes each session. Aim for at least 150 minutes.week. Goal should be pace of 3 miles/hours, or walking 1.5 miles in 30 minutes -recommend avoidance of tobacco products. Avoid excess alcohol. -ASCVD risk score: The  ASCVD Risk score (Arnett DK, et al., 2019) failed to calculate for the following reasons:   The 2019 ASCVD risk score is only valid for ages 59 to 15    Plan for follow up: 6 months or sooner as needed.  Medication Adjustments/Labs and Tests Ordered: Current medicines are reviewed at length with the patient today.  Concerns regarding medicines are outlined above.   Orders Placed This Encounter  Procedures   EKG 12-Lead   No orders of the defined types were placed in this encounter.  Patient Instructions  Medication Instructions:  Your Physician recommend you continue on your current medication as directed.    *If you need a refill on your cardiac medications before your next appointment, please call your pharmacy*   Lab Work: None ordered today   Testing/Procedures: None ordered today   Follow-Up: At [facility], you and your health needs are our priority.  As part of our continuing mission to provide you with exceptional heart care, we have created designated Provider Care Teams.  These Care Teams include your primary Cardiologist (physician) and Advanced Practice Providers (APPs -  Physician Assistants and Nurse Practitioners) who all work together to provide you with the care you need, when you need it.  We recommend signing up for the  patient portal called "MyChart".  Sign up information is provided on this After Visit Summary.  MyChart is used to connect with patients for Virtual Visits (Telemedicine).  Patients are able to view lab/test results, encounter notes, upcoming appointments, etc.  Non-urgent messages can be sent to your provider as well.   To learn more about what you can do with MyChart, go to [email or web address].    Your next appointment:   6 month(s)  The format for your next appointment:   In Person  Provider:   Buford Dresser, MD{         I,Mathew Stumpf,acting as a scribe for Buford Dresser, MD.,have documented all relevant documentation on the behalf of Buford Dresser, MD,as directed by  Buford Dresser, MD while in the presence of Buford Dresser, MD.  I, Buford Dresser, MD, have reviewed all documentation for this visit. The documentation on 08/15/22 for the exam, diagnosis, procedures, and orders are all accurate and complete.    Signed, Buford Dresser, MD PhD 08/15/2022     [place]

## 2022-08-15 ENCOUNTER — Encounter (HOSPITAL_BASED_OUTPATIENT_CLINIC_OR_DEPARTMENT_OTHER): Payer: Self-pay | Admitting: Cardiology

## 2022-08-21 ENCOUNTER — Other Ambulatory Visit: Payer: Self-pay

## 2022-08-21 DIAGNOSIS — D691 Qualitative platelet defects: Secondary | ICD-10-CM

## 2022-08-22 ENCOUNTER — Emergency Department (HOSPITAL_COMMUNITY): Payer: Medicare Other

## 2022-08-22 ENCOUNTER — Observation Stay (HOSPITAL_COMMUNITY): Payer: Medicare Other

## 2022-08-22 ENCOUNTER — Inpatient Hospital Stay (HOSPITAL_COMMUNITY)
Admission: EM | Admit: 2022-08-22 | Discharge: 2022-08-25 | DRG: 291 | Disposition: A | Discharge status: Home / Self Care | Payer: Medicare Other | Attending: Internal Medicine | Admitting: Internal Medicine

## 2022-08-22 DIAGNOSIS — J9811 Atelectasis: Secondary | ICD-10-CM | POA: Diagnosis not present

## 2022-08-22 DIAGNOSIS — I629 Nontraumatic intracranial hemorrhage, unspecified: Secondary | ICD-10-CM | POA: Diagnosis not present

## 2022-08-22 DIAGNOSIS — R531 Weakness: Secondary | ICD-10-CM | POA: Diagnosis not present

## 2022-08-22 DIAGNOSIS — I5031 Acute diastolic (congestive) heart failure: Secondary | ICD-10-CM | POA: Diagnosis present

## 2022-08-22 DIAGNOSIS — N400 Enlarged prostate without lower urinary tract symptoms: Secondary | ICD-10-CM | POA: Diagnosis present

## 2022-08-22 DIAGNOSIS — Z882 Allergy status to sulfonamides status: Secondary | ICD-10-CM

## 2022-08-22 DIAGNOSIS — E875 Hyperkalemia: Secondary | ICD-10-CM | POA: Diagnosis present

## 2022-08-22 DIAGNOSIS — F419 Anxiety disorder, unspecified: Secondary | ICD-10-CM | POA: Diagnosis present

## 2022-08-22 DIAGNOSIS — I130 Hypertensive heart and chronic kidney disease with heart failure and stage 1 through stage 4 chronic kidney disease, or unspecified chronic kidney disease: Principal | ICD-10-CM | POA: Diagnosis present

## 2022-08-22 DIAGNOSIS — Z602 Problems related to living alone: Secondary | ICD-10-CM | POA: Diagnosis present

## 2022-08-22 DIAGNOSIS — Z8546 Personal history of malignant neoplasm of prostate: Secondary | ICD-10-CM | POA: Diagnosis not present

## 2022-08-22 DIAGNOSIS — Z7901 Long term (current) use of anticoagulants: Secondary | ICD-10-CM | POA: Diagnosis not present

## 2022-08-22 DIAGNOSIS — N1831 Chronic kidney disease, stage 3a: Secondary | ICD-10-CM | POA: Diagnosis present

## 2022-08-22 DIAGNOSIS — Z79899 Other long term (current) drug therapy: Secondary | ICD-10-CM

## 2022-08-22 DIAGNOSIS — I509 Heart failure, unspecified: Secondary | ICD-10-CM | POA: Diagnosis not present

## 2022-08-22 DIAGNOSIS — R471 Dysarthria and anarthria: Secondary | ICD-10-CM | POA: Diagnosis present

## 2022-08-22 DIAGNOSIS — I4891 Unspecified atrial fibrillation: Secondary | ICD-10-CM | POA: Diagnosis not present

## 2022-08-22 DIAGNOSIS — E785 Hyperlipidemia, unspecified: Secondary | ICD-10-CM | POA: Diagnosis present

## 2022-08-22 DIAGNOSIS — I4821 Permanent atrial fibrillation: Secondary | ICD-10-CM | POA: Diagnosis present

## 2022-08-22 DIAGNOSIS — R442 Other hallucinations: Secondary | ICD-10-CM | POA: Diagnosis not present

## 2022-08-22 DIAGNOSIS — I10 Essential (primary) hypertension: Secondary | ICD-10-CM | POA: Diagnosis present

## 2022-08-22 DIAGNOSIS — I5033 Acute on chronic diastolic (congestive) heart failure: Secondary | ICD-10-CM | POA: Diagnosis present

## 2022-08-22 DIAGNOSIS — R29818 Other symptoms and signs involving the nervous system: Secondary | ICD-10-CM | POA: Diagnosis not present

## 2022-08-22 DIAGNOSIS — R000 Tachycardia, unspecified: Secondary | ICD-10-CM | POA: Diagnosis not present

## 2022-08-22 DIAGNOSIS — R4182 Altered mental status, unspecified: Secondary | ICD-10-CM | POA: Diagnosis not present

## 2022-08-22 DIAGNOSIS — I4819 Other persistent atrial fibrillation: Secondary | ICD-10-CM | POA: Diagnosis present

## 2022-08-22 DIAGNOSIS — G319 Degenerative disease of nervous system, unspecified: Secondary | ICD-10-CM | POA: Diagnosis not present

## 2022-08-22 DIAGNOSIS — I080 Rheumatic disorders of both mitral and aortic valves: Secondary | ICD-10-CM | POA: Diagnosis present

## 2022-08-22 LAB — CBC WITH DIFFERENTIAL/PLATELET
Abs Immature Granulocytes: 0.02 10*3/uL (ref 0.00–0.07)
Basophils Absolute: 0.1 10*3/uL (ref 0.0–0.1)
Basophils Relative: 1 %
Eosinophils Absolute: 0 10*3/uL (ref 0.0–0.5)
Eosinophils Relative: 1 %
HCT: 43.8 % (ref 39.0–52.0)
Hemoglobin: 14.2 g/dL (ref 13.0–17.0)
Immature Granulocytes: 0 %
Lymphocytes Relative: 23 %
Lymphs Abs: 1.4 10*3/uL (ref 0.7–4.0)
MCH: 31.5 pg (ref 26.0–34.0)
MCHC: 32.4 g/dL (ref 30.0–36.0)
MCV: 97.1 fL (ref 80.0–100.0)
Monocytes Absolute: 0.8 10*3/uL (ref 0.1–1.0)
Monocytes Relative: 12 %
Neutro Abs: 4 10*3/uL (ref 1.7–7.7)
Neutrophils Relative %: 63 %
Platelets: 149 10*3/uL — ABNORMAL LOW (ref 150–400)
RBC: 4.51 MIL/uL (ref 4.22–5.81)
RDW: 14.4 % (ref 11.5–15.5)
WBC: 6.3 10*3/uL (ref 4.0–10.5)
nRBC: 0 % (ref 0.0–0.2)

## 2022-08-22 LAB — COMPREHENSIVE METABOLIC PANEL
ALT: 24 U/L (ref 0–44)
AST: 37 U/L (ref 15–41)
Albumin: 3.7 g/dL (ref 3.5–5.0)
Alkaline Phosphatase: 38 U/L (ref 38–126)
Anion gap: 6 (ref 5–15)
BUN: 22 mg/dL (ref 8–23)
CO2: 25 mmol/L (ref 22–32)
Calcium: 8.9 mg/dL (ref 8.9–10.3)
Chloride: 106 mmol/L (ref 98–111)
Creatinine, Ser: 1.1 mg/dL (ref 0.61–1.24)
GFR, Estimated: >60 mL/min (ref >60)
Glucose, Bld: 110 mg/dL — ABNORMAL HIGH (ref 70–99)
Potassium: 5.5 mmol/L — ABNORMAL HIGH (ref 3.5–5.1)
Sodium: 137 mmol/L (ref 135–145)
Total Bilirubin: 1.1 mg/dL (ref 0.3–1.2)
Total Protein: 6.4 g/dL — ABNORMAL LOW (ref 6.5–8.1)

## 2022-08-22 LAB — BRAIN NATRIURETIC PEPTIDE: B Natriuretic Peptide: 402.7 pg/mL — ABNORMAL HIGH (ref 0.0–100.0)

## 2022-08-22 LAB — CK: Total CK: 203 U/L (ref 49–397)

## 2022-08-22 MED ORDER — SODIUM CHLORIDE 0.9 % IV SOLN: INTRAVENOUS | Status: DC

## 2022-08-22 MED ORDER — SODIUM CHLORIDE 0.9 % IV BOLUS
1000.0000 mL | Freq: Once | INTRAVENOUS | Status: AC
  Administered 2022-08-22: 1000 mL via INTRAVENOUS

## 2022-08-22 MED ORDER — ENOXAPARIN SODIUM 40 MG/0.4ML IJ SOSY
40.0000 mg | PREFILLED_SYRINGE | INTRAMUSCULAR | Status: DC
Start: 2022-08-23 — End: 2022-08-23

## 2022-08-22 NOTE — ED Notes (Signed)
Patient returned from radiology at this time ?

## 2022-08-22 NOTE — ED Provider Notes (Signed)
[facility] DEPT Provider Note   CSN: [id number] Arrival date & time: 08/22/22  1831     History  Chief Complaint  Patient presents with   Weakness    Mark Stark. is a 88 y.o. male.  HPI   Presents via EMS from home with concern for weakness, visual hallucination, decreased oral intake.  Patient has multiple medical issues including atrial flutter for which she takes Eliquis.  He was in his usual state of health until about today.  History is obtained by the patient and EMS individuals.  Patient notes that over the day he has had progressive weakness generalized, no focality.  No pain anywhere including abdominal or chest discomfort.  No dyspnea.  He notes that he simply feels weaker than usual, and has been seeing things.  EMS reports mild hypotension on arrival, this improved with 500 mL fluid bolus.  Home Medications Prior to Admission medications   Medication Sig Start Date End Date Taking? Authorizing Provider  b complex vitamins tablet Take 1 tablet by mouth daily.    [provider]  Calcium Carbonate-Vitamin D 600-400 MG-UNIT tablet Take 1 tablet by mouth daily.    [provider]  clorazepate (TRANXENE) 15 MG tablet TAKE 1 TABLET BY MOUTH EVERYDAY AT BEDTIME Patient taking differently: Take 15 mg by mouth at bedtime. TAKE 1 TABLET BY MOUTH EVERYDAY AT BEDTIME 08/10/22   [name], [name], DO  cromolyn (NASALCROM) 5.2 MG/ACT nasal spray Place 2 sprays into the nose 2 (two) times daily.    [provider]  ELIQUIS 5 MG TABS tablet TAKE 1 TABLET BY MOUTH TWICE A DAY Patient taking differently: Take 5 mg by mouth 2 (two) times daily. 05/05/22   Roma Schanz R, DO  fenofibrate 160 MG tablet TAKE 1 TABLET BY MOUTH EVERY DAY Patient taking differently: Take 160 mg by mouth daily. 05/05/22   Roma Schanz R, DO  fish oil-omega-3 fatty acids 1000 MG capsule Take 1 g by mouth 3 (three) times daily.    [provider]  latanoprost (XALATAN) 0.005 % ophthalmic solution Place 1 drop into the left eye at bedtime. 07/24/22   [provider]  metoprolol succinate (TOPROL XL) 25 MG 24 hr tablet Take 0.5 tablets (12.5 mg total) by mouth daily. 03/13/22   Buford Dresser, MD  Multiple Vitamins-Minerals (MULTIVITAMIN PO) Take 1 tablet by mouth daily.    [provider]  Multiple Vitamins-Minerals (PRESERVISION AREDS 2) CAPS 2 po qd Patient taking differently: Take 1 tablet by mouth in the morning and at bedtime. 07/05/17   [name], DO  potassium chloride (KLOR-CON) 10 MEQ tablet Take 1 tablet (10 mEq total) by mouth daily as needed (take when you take torsemide). 01/03/22   Buford Dresser, MD  simvastatin (ZOCOR) 10 MG tablet TAKE 1 TABLET BY MOUTH EVERYDAY AT BEDTIME Patient taking differently: Take 10 mg by mouth at bedtime. 07/24/22   Buford Dresser, MD  tamsulosin (FLOMAX) 0.4 MG CAPS capsule Take 0.4 mg by mouth at bedtime. 11/24/21   [provider]  torsemide (DEMADEX) 20 MG tablet Take 1 tablet (20 mg total) by mouth daily as needed (if weight up 3 pounds overnight or 5 lbs from baseline). TAKE 1 TABLET BY MOUTH EVERY DAY 01/03/22   Buford Dresser, MD  vitamin C (ASCORBIC ACID) 500 MG tablet Take 500 mg by mouth daily.    [provider]  Allergies    Sulfonamide derivatives    Review of Systems   Review of Systems  All other systems reviewed and are negative.   Physical Exam Updated Vital Signs BP 134/89   Pulse (!) 101   Temp (!) 97.4 F (36.3 C) (Axillary)   Resp 16   SpO2 98%  Physical Exam Vitals and nursing note reviewed.  Constitutional:      Appearance: He is well-developed. He is ill-appearing.  HENT:     Head: Normocephalic and atraumatic.  Eyes:     Conjunctiva/sclera: Conjunctivae normal.  Cardiovascular:     Rate and Rhythm: Normal rate and regular rhythm.  Pulmonary:     Effort:  Pulmonary effort is normal. No respiratory distress.     Breath sounds: No stridor.  Abdominal:     General: There is no distension.     Tenderness: There is no abdominal tenderness. There is no guarding or rebound.  Skin:    General: Skin is warm and dry.     Coloration: Skin is not pale.  Neurological:     General: No focal deficit present.     Mental Status: He is alert and oriented to person, place, and time.     Cranial Nerves: Dysarthria present.     Motor: Atrophy present.     Coordination: Coordination normal.     Comments: No asym of strength 4+/5 in UE/LE  Psychiatric:     Comments: Slowed, but normal cognition, normal memory     ED Results / Procedures / Treatments   Labs (all labs ordered are listed, but only abnormal results are displayed) Labs Reviewed  COMPREHENSIVE METABOLIC PANEL - Abnormal; Notable for the following components:      Result Value   Potassium 5.5 (*)    Glucose, Bld 110 (*)    Total Protein 6.4 (*)    All other components within normal limits  BRAIN NATRIURETIC PEPTIDE - Abnormal; Notable for the following components:   B Natriuretic Peptide 402.7 (*)    All other components within normal limits  CBC WITH DIFFERENTIAL/PLATELET - Abnormal; Notable for the following components:   Platelets 149 (*)    All other components within normal limits  CK  URINALYSIS, ROUTINE W REFLEX MICROSCOPIC  LACTIC ACID, PLASMA  LACTIC ACID, PLASMA    EKG EKG Interpretation  Date/Time:  Tuesday August 22 2022 18:48:51 EDT Ventricular Rate:  102 PR Interval:    QRS Duration: 80 QT Interval:  333 QTC Calculation: 434 R Axis:   42 Text Interpretation: Atrial fibrillation Abnormal ECG Confirmed by Carmin Muskrat [phone]) on 08/22/2022 8:03:54 PM  Radiology CT HEAD WO CONTRAST  Result Date: 08/22/2022 CLINICAL DATA:  Mental status change, unknown cause EXAM: CT HEAD WITHOUT CONTRAST TECHNIQUE: Contiguous axial images were obtained from the base of the  skull through the vertex without intravenous contrast. RADIATION DOSE REDUCTION: This exam was performed according to the departmental dose-optimization program which includes automated exposure control, adjustment of the mA and/or kV according to patient size and/or use of iterative reconstruction technique. COMPARISON:  None Available. BRAIN: BRAIN Cerebral ventricle sizes are concordant with the degree of cerebral volume loss. Patchy and confluent areas of decreased attenuation are noted throughout the deep and periventricular white matter of the cerebral hemispheres bilaterally, compatible with chronic microvascular ischemic disease. No evidence of large-territorial acute infarction. No parenchymal hemorrhage. No mass lesion. No extra-axial collection. No mass effect or midline shift. No hydrocephalus. Basilar cisterns are patent. Vascular: No hyperdense  vessel. Skull: No acute fracture or focal lesion. Sinuses/Orbits: Paranasal sinuses and mastoid air cells are clear. Bilateral lens replacement. Otherwise the orbits are unremarkable. Other: None. IMPRESSION: No acute intracranial abnormality. Electronically Signed   By: Iven Finn M.D.   On: 08/22/2022 19:43   DG Chest Port 1 View  Result Date: 08/22/2022 CLINICAL DATA:  Generalized weakness EXAM: PORTABLE CHEST 1 VIEW COMPARISON:  10/25/2021 FINDINGS: Cardiac shadow is within normal limits. Aortic calcifications are seen. Patchy airspace opacity is again identified in the left base likely representing atelectasis. No acute bony abnormality is seen. Central vascular congestion is noted. IMPRESSION: Mild CHF with left basilar atelectasis. Electronically Signed   By: Inez Catalina M.D.   On: 08/22/2022 19:22    Procedures Procedures    Medications Ordered in ED Medications  sodium chloride 0.9 % bolus 1,000 mL (0 mLs Intravenous Stopped 08/22/22 2108)    And  0.9 %  sodium chloride infusion (has no administration in time range)    ED Course/  Medical Decision Making/ A&P This patient with a Hx of atrial flutter, now on anticoagulation presents to the ED for concern of generalized weakness, visual hallucination, hypotension, this involves an extensive number of treatment options, and is a complaint that carries with it a high risk of complications and morbidity.    The differential diagnosis includes hydration, rhabdomyolysis, spontaneous hemorrhage, infection, dehydration, aneurysmal rupture, less likely   Social Determinants of Health:  Advanced age  Additional history obtained:  Additional history and/or information obtained from EMS, notable for as in HPI   After the initial evaluation, orders, including: Labs CT x-ray urinalysis were initiated.   Patient placed on Cardiac and Pulse-Oximetry Monitors. The patient was maintained on a cardiac monitor.  The cardiac monitored showed an rhythm of A-fib, 90/100, abnormal The patient was also maintained on pulse oximetry. The readings were typically 100% room air normal   On repeat evaluation of the patient improved And was normal initial evaluation the patient was minimally verbal, he now notes that he has had difficulty speaking, onset earlier in the day, time not defined.  Now, with presentation of generalized weakness complicated by description of new dysarthria broader differential including CVA is a consideration.  Patient is on Eliquis, is not a candidate for TNK, and with unknown last seen normal, I discussed his case with our neurology colleagues.  Patient will have MRI, if positive can proceed with stroke evaluation.   Lab Tests:  I personally interpreted labs.  The pertinent results include: Hyperkalemia 5.5, BNP greater than 400  Imaging Studies ordered:  I independently visualized and interpreted imaging which showed head CT unremarkable, chest x-ray with mild cardiomegaly I agree with the radiologist interpretation  Consultations Obtained:  I requested  consultation with the neurology as above.  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  With no clear last seen normal time, patient was not designated as a code stroke, but will require additional MRI, for consideration of his dysarthria in the context of generalized weakness which may be multifactorial with hyperkalemia, elevated BNP/heart failure is considerations.  Per neuro, patient can continue Eliquis, and this may represent maximal medical management even if he is found to have stroke on MRI.   Final Clinical Impression(s) /  ED Diagnoses Final diagnoses:  Dysarthria  Weakness     Carmin Muskrat, MD 08/22/22 2312

## 2022-08-22 NOTE — ED Triage Notes (Signed)
Per EMS, patient from home, reports visual hallucinations today. C/o generalized weakness with poor PO intake.

## 2022-08-23 ENCOUNTER — Other Ambulatory Visit: Payer: Medicare Other

## 2022-08-23 ENCOUNTER — Encounter (HOSPITAL_COMMUNITY): Payer: Self-pay | Admitting: Internal Medicine

## 2022-08-23 ENCOUNTER — Other Ambulatory Visit: Payer: Self-pay

## 2022-08-23 ENCOUNTER — Observation Stay (HOSPITAL_COMMUNITY): Payer: Medicare Other

## 2022-08-23 DIAGNOSIS — I629 Nontraumatic intracranial hemorrhage, unspecified: Secondary | ICD-10-CM | POA: Diagnosis not present

## 2022-08-23 DIAGNOSIS — I5031 Acute diastolic (congestive) heart failure: Secondary | ICD-10-CM | POA: Diagnosis not present

## 2022-08-23 DIAGNOSIS — R531 Weakness: Secondary | ICD-10-CM

## 2022-08-23 DIAGNOSIS — E875 Hyperkalemia: Secondary | ICD-10-CM

## 2022-08-23 DIAGNOSIS — G319 Degenerative disease of nervous system, unspecified: Secondary | ICD-10-CM | POA: Diagnosis not present

## 2022-08-23 DIAGNOSIS — R29818 Other symptoms and signs involving the nervous system: Secondary | ICD-10-CM | POA: Diagnosis not present

## 2022-08-23 DIAGNOSIS — N400 Enlarged prostate without lower urinary tract symptoms: Secondary | ICD-10-CM

## 2022-08-23 LAB — ECHOCARDIOGRAM COMPLETE
AR max vel: 1.59 cm2
AV Area VTI: 1.63 cm2
AV Area mean vel: 1.59 cm2
AV Mean grad: 5.8 mmHg
AV Peak grad: 10.5 mmHg
Ao pk vel: 1.62 m/s
Area-P 1/2: 4.02 cm2
MV M vel: 5.54 m/s
MV Peak grad: 122.8 mmHg
P 1/2 time: 459 msec
Radius: 0.8 cm
S' Lateral: 2.7 cm

## 2022-08-23 LAB — BASIC METABOLIC PANEL
Anion gap: 9 (ref 5–15)
BUN: 20 mg/dL (ref 8–23)
CO2: 24 mmol/L (ref 22–32)
Calcium: 9.1 mg/dL (ref 8.9–10.3)
Chloride: 107 mmol/L (ref 98–111)
Creatinine, Ser: 1.06 mg/dL (ref 0.61–1.24)
GFR, Estimated: >60 mL/min (ref >60)
Glucose, Bld: 115 mg/dL — ABNORMAL HIGH (ref 70–99)
Potassium: 4.8 mmol/L (ref 3.5–5.1)
Sodium: 140 mmol/L (ref 135–145)

## 2022-08-23 LAB — URINALYSIS, ROUTINE W REFLEX MICROSCOPIC
Bilirubin Urine: NEGATIVE
Glucose, UA: NEGATIVE mg/dL
Hgb urine dipstick: NEGATIVE
Ketones, ur: NEGATIVE mg/dL
Leukocytes,Ua: NEGATIVE
Nitrite: NEGATIVE
Protein, ur: NEGATIVE mg/dL
Specific Gravity, Urine: 1.012 (ref 1.005–1.030)
pH: 8 (ref 5.0–8.0)

## 2022-08-23 LAB — CBC
HCT: 42 % (ref 39.0–52.0)
Hemoglobin: 13.7 g/dL (ref 13.0–17.0)
MCH: 31.9 pg (ref 26.0–34.0)
MCHC: 32.6 g/dL (ref 30.0–36.0)
MCV: 97.9 fL (ref 80.0–100.0)
Platelets: 154 10*3/uL (ref 150–400)
RBC: 4.29 MIL/uL (ref 4.22–5.81)
RDW: 14.4 % (ref 11.5–15.5)
WBC: 6.4 10*3/uL (ref 4.0–10.5)
nRBC: 0 % (ref 0.0–0.2)

## 2022-08-23 LAB — PHOSPHORUS: Phosphorus: 4.1 mg/dL (ref 2.5–4.6)

## 2022-08-23 LAB — CK: Total CK: 130 U/L (ref 49–397)

## 2022-08-23 LAB — MAGNESIUM: Magnesium: 1.9 mg/dL (ref 1.7–2.4)

## 2022-08-23 LAB — LACTIC ACID, PLASMA: Lactic Acid, Venous: 1.6 mmol/L (ref 0.5–1.9)

## 2022-08-23 MED ORDER — LATANOPROST 0.005 % OP SOLN
1.0000 [drp] | Freq: Every day | OPHTHALMIC | Status: DC
  Administered 2022-08-23 – 2022-08-24 (×2): 1 [drp] via OPHTHALMIC
  Filled 2022-08-23: qty 2.5

## 2022-08-23 MED ORDER — APIXABAN 5 MG PO TABS
5.0000 mg | ORAL_TABLET | Freq: Two times a day (BID) | ORAL | Status: DC
  Administered 2022-08-23 – 2022-08-25 (×5): 5 mg via ORAL
  Filled 2022-08-23 (×5): qty 1

## 2022-08-23 MED ORDER — VITAMIN C 500 MG PO TABS
500.0000 mg | ORAL_TABLET | Freq: Every day | ORAL | Status: DC
  Administered 2022-08-23 – 2022-08-25 (×3): 500 mg via ORAL
  Filled 2022-08-23 (×3): qty 1

## 2022-08-23 MED ORDER — ACETAMINOPHEN 325 MG PO TABS: 650.0000 mg | ORAL_TABLET | Freq: Four times a day (QID) | ORAL | Status: DC | PRN

## 2022-08-23 MED ORDER — FENOFIBRATE 160 MG PO TABS
160.0000 mg | ORAL_TABLET | Freq: Every day | ORAL | Status: DC
  Administered 2022-08-23 – 2022-08-25 (×3): 160 mg via ORAL
  Filled 2022-08-23 (×4): qty 1

## 2022-08-23 MED ORDER — METOPROLOL SUCCINATE ER 25 MG PO TB24
12.5000 mg | ORAL_TABLET | Freq: Every day | ORAL | Status: DC
  Administered 2022-08-23 – 2022-08-25 (×3): 12.5 mg via ORAL
  Filled 2022-08-23 (×3): qty 1

## 2022-08-23 MED ORDER — CROMOLYN SODIUM 5.2 MG/ACT NA AERS: 1.0000 | INHALATION_SPRAY | Freq: Two times a day (BID) | NASAL | Status: DC

## 2022-08-23 MED ORDER — FUROSEMIDE 10 MG/ML IJ SOLN
20.0000 mg | Freq: Two times a day (BID) | INTRAMUSCULAR | Status: DC
  Administered 2022-08-23 (×2): 20 mg via INTRAVENOUS
  Filled 2022-08-23 (×2): qty 4

## 2022-08-23 MED ORDER — TAMSULOSIN HCL 0.4 MG PO CAPS
0.4000 mg | ORAL_CAPSULE | Freq: Every day | ORAL | Status: DC
  Administered 2022-08-23 – 2022-08-24 (×2): 0.4 mg via ORAL
  Filled 2022-08-23 (×2): qty 1

## 2022-08-23 MED ORDER — SIMVASTATIN 10 MG PO TABS
10.0000 mg | ORAL_TABLET | Freq: Every day | ORAL | Status: DC
  Administered 2022-08-23 – 2022-08-24 (×2): 10 mg via ORAL
  Filled 2022-08-23 (×2): qty 1

## 2022-08-23 MED ORDER — ADULT MULTIVITAMIN LIQUID CH
Freq: Every day | ORAL | Status: DC
  Filled 2022-08-23: qty 15

## 2022-08-23 MED ORDER — ADULT MULTIVITAMIN LIQUID CH
15.0000 mL | Freq: Every day | ORAL | Status: DC
  Administered 2022-08-24 – 2022-08-25 (×2): 15 mL via ORAL
  Filled 2022-08-23 (×3): qty 15

## 2022-08-23 MED ORDER — CLORAZEPATE DIPOTASSIUM 3.75 MG PO TABS
15.0000 mg | ORAL_TABLET | Freq: Every day | ORAL | Status: DC
  Administered 2022-08-23 – 2022-08-24 (×2): 15 mg via ORAL
  Filled 2022-08-23 (×2): qty 4

## 2022-08-23 MED ORDER — MELATONIN 5 MG PO TABS: 5.0000 mg | ORAL_TABLET | Freq: Every evening | ORAL | Status: DC | PRN

## 2022-08-23 MED ORDER — POLYETHYLENE GLYCOL 3350 17 G PO PACK: 17.0000 g | PACK | Freq: Every day | ORAL | Status: DC | PRN

## 2022-08-23 MED ORDER — FUROSEMIDE 10 MG/ML IJ SOLN
20.0000 mg | Freq: Two times a day (BID) | INTRAMUSCULAR | Status: DC
Start: 2022-08-23 — End: 2022-08-24
  Administered 2022-08-23 – 2022-08-24 (×2): 20 mg via INTRAVENOUS
  Filled 2022-08-23 (×2): qty 2

## 2022-08-23 MED ORDER — PROCHLORPERAZINE EDISYLATE 10 MG/2ML IJ SOLN: 10.0000 mg | Freq: Four times a day (QID) | INTRAMUSCULAR | Status: DC | PRN

## 2022-08-23 NOTE — Evaluation (Signed)
Physical Therapy Evaluation Patient Details Name: Mark Stark. MRN: [id number] DOB: Dec 22, 1928 Today's Date: 08/23/2022  History of Present Illness  Patient is 88 y.o. male pesents via EMS from home with concern for weakness, visual hallucination, decreased oral intake. Patient notes that over the day he has had progressive weakness generalized, no focality.  No pain anywhere including abdominal or chest discomfort.  No dyspnea.  He notes that he simply feels weaker than usual, and has been seeing things. PMH significant for CHF, anxiety, Afib, CKD III, DLD, HTN, PAF, prostate cancer.   Clinical Impression  Mark Stark. is 88 y.o. male admitted with above HPI and diagnosis. Patient is currently limited by functional impairments below (see PT problem list). Patient lives alone and is independent with no AD for mobility at baseline. Currently he requires min guard for transfers and min guard/assist with RW for transfers and gait. He has assist available from local friends/neighbors and is close to his normal baseline. Patient will benefit from continued skilled PT interventions to address impairments and progress independence with mobility, recommending HHPT. Acute PT will follow and progress as able.        Recommendations for follow up therapy are one component of a multi-disciplinary discharge planning process, led by the attending physician.  Recommendations may be updated based on patient status, additional functional criteria and insurance authorization.  Follow Up Recommendations Home health PT      Assistance Recommended at Discharge Intermittent Supervision/Assistance  Patient can return home with the following  A little help with walking and/or transfers;A little help with bathing/dressing/bathroom;Assistance with cooking/housework;Assist for transportation;Help with stairs or ramp for entrance    Equipment Recommendations None recommended by PT  Recommendations for Other  Services       Functional Status Assessment Patient has had a recent decline in their functional status and demonstrates the ability to make significant improvements in function in a reasonable and predictable amount of time.     Precautions / Restrictions Precautions Precautions: Fall Restrictions Weight Bearing Restrictions: No      Mobility  Bed Mobility Overal bed mobility: Needs Assistance Bed Mobility: Supine to Sit, Sit to Supine     Supine to sit: Min guard, HOB elevated Sit to supine: Min guard, HOB elevated   General bed mobility comments: min guard with pt taking extra time to move to edge. pt able to bring bil LE's onto bed at EOS.    Transfers Overall transfer level: Needs assistance Equipment used: Rolling walker (2 wheels) Transfers: Sit to/from Stand Sit to Stand: Min guard                Ambulation/Gait Ambulation/Gait assistance: Min assist, Min guard Gait Distance (Feet): 80 Feet (2x40) Assistive device: Rolling walker (2 wheels)   Gait velocity: decr        Stairs            Wheelchair Mobility    Modified Rankin (Stroke Patients Only)       Balance Overall balance assessment: Needs assistance Sitting-balance support: Feet supported Sitting balance-Leahy Scale: Good     Standing balance support: During functional activity, Reliant on assistive device for balance, Bilateral upper extremity supported                                 Pertinent Vitals/Pain Pain Assessment Pain Assessment: No/denies pain    Home Living Family/patient expects to be  discharged to:: Private residence Living Arrangements: Alone Available Help at Discharge: Family;Friend(s);Available PRN/intermittently Type of Home: House         Home Layout: Laundry or work area in basement (has a workshop in [employer] with a pull down ladder) Home Equipment: Conservation officer, nature (2 wheels);Shower seat;BSC/3in1;Cane - single point Additional  Comments: goes up in the attic daily and works in the garden. goes in to the basement to get can goods    Prior Function Prior Level of Function : Independent/Modified Independent             Mobility Comments: pt denies use of ED for mobility. reports he goes up/down pull down stairs to attic where his workshop is and often goes to the basement to get his canned goods as he is an avid tomatoe juice canner.       Hand Dominance   Dominant Hand: Right    Extremity/Trunk Assessment   Upper Extremity Assessment Upper Extremity Assessment: RUE deficits/detail;LUE deficits/detail RUE Deficits / Details: WNL ROM, 5/5 strength RUE Sensation: WNL RUE Coordination: WNL LUE Deficits / Details: WNL ROM, 5/5 strength LUE Sensation: WNL LUE Coordination: WNL    Lower Extremity Assessment Lower Extremity Assessment: Defer to PT evaluation RLE Deficits / Details: 4 to 4+/5 for hip flexion, knee flexion/extension, dorsiflexion RLE Sensation: WNL RLE Coordination: WNL LLE Deficits / Details: 4 to 4+/5 for hip flexion, knee flexion/extension, dorsiflexion LLE Sensation: WNL LLE Coordination: WNL    Cervical / Trunk Assessment Cervical / Trunk Assessment: Kyphotic  Communication   Communication: HOH  Cognition Arousal/Alertness: Awake/alert Behavior During Therapy: WFL for tasks assessed/performed Overall Cognitive Status: Within Functional Limits for tasks assessed                                 General Comments: pt with good memory of prior hospitalization and current situation        General Comments      Exercises     Assessment/Plan    PT Assessment Patient needs continued PT services  PT Problem List Decreased strength;Decreased range of motion;Decreased activity tolerance;Decreased balance;Decreased mobility;Decreased knowledge of use of DME;Decreased knowledge of precautions       PT Treatment Interventions Gait training;DME instruction;Stair  training;Functional mobility training;Therapeutic activities;Therapeutic exercise;Balance training;Patient/family education    PT Goals (Current goals can be found in the Care Plan section)  Acute Rehab PT Goals Patient Stated Goal: get home and build up strength PT Goal Formulation: With patient Time For Goal Achievement: 09/06/22 Potential to Achieve Goals: Good    Frequency Min 3X/week     Co-evaluation               AM-PAC PT "6 Clicks" Mobility  Outcome Measure Help needed turning from your back to your side while in a flat bed without using bedrails?: A Little Help needed moving from lying on your back to sitting on the side of a flat bed without using bedrails?: A Little Help needed moving to and from a bed to a chair (including a wheelchair)?: A Little Help needed standing up from a chair using your arms (e.g., wheelchair or bedside chair)?: A Little Help needed to walk in hospital room?: A Little Help needed climbing 3-5 steps with a railing? : A Little 6 Click Score: 18    End of Session Equipment Utilized During Treatment: Gait belt Activity Tolerance: Patient tolerated treatment well Patient left: in bed;with  call bell/phone within reach Nurse Communication: Mobility status PT Visit Diagnosis: Muscle weakness (generalized) (M62.81);Difficulty in walking, not elsewhere classified (R26.2);Unsteadiness on feet (R26.81)    Time: 4854-6270 PT Time Calculation (min) (ACUTE ONLY): 43 min   Charges:   PT Evaluation $PT Eval Low Complexity: 1 Low PT Treatments $Gait Training: 8-22 mins        Verner Mould, DPT Acute Rehabilitation Services Office [phone] Pager [phone]  08/23/22 1:00 PM

## 2022-08-23 NOTE — H&P (Addendum)
History and Physical  Mark Stark. HMC:[id number] DOB: 1928-11-11 DOA: 08/22/2022  Referring physician: Dr. Vanita Panda, [place]  PCP: [name], [name], DO  Outpatient Specialists: Ophthalmology, cardiology. Patient coming from: Home.  Chief Complaint: Generalized weakness, dysarthria.  HPI: Mark Stark. is a 88 y.o. male with medical history significant for permanent atrial fibrillation on Eliquis, chronic diastolic CHF, [name], hypertension, hyperlipidemia, aortic insufficiency, mitral regurgitation, inferior mesenteric vein thrombosis, history of prostate cancer and anxiety who presented to [facility] ED with complaints of generalized weakness.  Also noted to have dysarthria.  Due to concern for neurologic disorder, EDP discussed the case with neurology/stroke team, recommended MRI brain.  MRI brain was nonacute.  Chest x-ray revealed mild pulmonary edema, BNP was elevated greater than 400, serum potassium 5.5.  UA is pending at the time of this dictation.  TRH, hospitalist service, was asked to admit.  ED Course: Tmax 97.4.  BP 143/79, pulse 114, respiration rate 16, O2 saturation 97% on room air.  Lab studies remarkable for serum potassium 5.5, BNP 402.  Platelet count 149.  Review of Systems: Review of systems as noted in the HPI. All other systems reviewed and are negative.   Past Medical History:  Diagnosis Date   Acute diastolic CHF (congestive heart failure) (HCC) 10/25/2021   Anxiety    Aortic insufficiency    Atrial fibrillation (HCC)    Chronic diastolic CHF (congestive heart failure) (HCC)    Chronic kidney disease, stage 3a (HCC)    Diverticulitis    Dyslipidemia    Hypertension    Inferior mesenteric vein thrombosis (HCC)    Mitral regurgitation    Nonrheumatic mitral valve regurgitation 10/28/2021   Paroxysmal atrial fibrillation ([place]) 01/12/2018   Persistent atrial fibrillation ([place])    Prostate cancer [facility])    Past Surgical History:  Procedure Laterality Date    CARDIOVERSION N/A 03/01/2018   Procedure: CARDIOVERSION;  Surgeon: Troy Sine, MD;  Location: [place];  Service: Cardiovascular;  Laterality: N/A;   CARDIOVERSION N/A 08/26/2021   Procedure: CARDIOVERSION;  Surgeon: Pixie Casino, MD;  Location: [facility]COPY;  Service: Cardiovascular;  Laterality: N/A;   CARDIOVERSION N/A 12/06/2021   Procedure: CARDIOVERSION;  Surgeon: Donato Heinz, MD;  Location: [place];  Service: Cardiovascular;  Laterality: N/A;   CATARACT EXTRACTION  01/27/2010   right   CATARACT EXTRACTION  03/04/2010   left   Prostate seed implants     PROSTATE SURGERY      Social History:  reports that he has never smoked. He has never used smokeless tobacco. He reports that he does not drink alcohol and does not use drugs.   Allergies  Allergen Reactions   Sulfonamide Derivatives Other (See Comments)    Unknown - patient cannot remember reaction    Family History  Problem Relation Age of Onset   Dementia Mother    Alzheimer's disease Mother    Mental illness Mother        ALZ DISEASE   Cancer Brother 55       pancreatic cancer      Prior to Admission medications   Medication Sig Start Date End Date Taking? Authorizing Provider  b complex vitamins tablet Take 1 tablet by mouth daily.   Yes [provider]  Calcium Carbonate-Vitamin D 600-400 MG-UNIT tablet Take 1 tablet by mouth daily.   Yes [provider]  clorazepate (TRANXENE) 15 MG tablet TAKE 1 TABLET BY MOUTH EVERYDAY AT BEDTIME Patient  taking differently: Take 15 mg by mouth at bedtime. TAKE 1 TABLET BY MOUTH EVERYDAY AT BEDTIME 08/10/22  Yes Roma Schanz R, DO  cromolyn (NASALCROM) 5.2 MG/ACT nasal spray Place 2 sprays into the nose 2 (two) times daily.   Yes [provider]  ELIQUIS 5 MG TABS tablet TAKE 1 TABLET BY MOUTH TWICE A DAY Patient taking differently: Take 5 mg by mouth 2 (two) times daily. 05/05/22  Yes Roma Schanz R, DO   fenofibrate 160 MG tablet TAKE 1 TABLET BY MOUTH EVERY DAY Patient taking differently: Take 160 mg by mouth daily. 05/05/22  Yes Roma Schanz R, DO  fish oil-omega-3 fatty acids 1000 MG capsule Take 1 g by mouth 3 (three) times daily.   Yes [provider]  latanoprost (XALATAN) 0.005 % ophthalmic solution Place 1 drop into the left eye at bedtime. 07/24/22  Yes [provider]  metoprolol succinate (TOPROL XL) 25 MG 24 hr tablet Take 0.5 tablets (12.5 mg total) by mouth daily. 03/13/22  Yes Buford Dresser, MD  Multiple Vitamins-Minerals (MULTIVITAMIN PO) Take 1 tablet by mouth daily.   Yes [provider]  Multiple Vitamins-Minerals (PRESERVISION AREDS 2) CAPS 2 po qd Patient taking differently: Take 1 tablet by mouth in the morning and at bedtime. 07/05/17  Yes Roma Schanz R, DO  potassium chloride (KLOR-CON) 10 MEQ tablet Take 1 tablet (10 mEq total) by mouth daily as needed (take when you take torsemide). 01/03/22  Yes Buford Dresser, MD  simvastatin (ZOCOR) 10 MG tablet TAKE 1 TABLET BY MOUTH EVERYDAY AT BEDTIME Patient taking differently: Take 10 mg by mouth at bedtime. 07/24/22  Yes Buford Dresser, MD  tamsulosin (FLOMAX) 0.4 MG CAPS capsule Take 0.4 mg by mouth at bedtime. 11/24/21  Yes [provider]  torsemide (DEMADEX) 20 MG tablet Take 1 tablet (20 mg total) by mouth daily as needed (if weight up 3 pounds overnight or 5 lbs from baseline). TAKE 1 TABLET BY MOUTH EVERY DAY 01/03/22  Yes Buford Dresser, MD  vitamin C (ASCORBIC ACID) 500 MG tablet Take 500 mg by mouth daily.   Yes [provider]    Physical Exam: BP (!) 143/79   Pulse (!) 108   Temp (!) 97.4 F (36.3 C) (Axillary)   Resp 16   SpO2 97%   General: 88 y.o. year-old male well developed well nourished in no acute distress.  Alert and oriented x3. Cardiovascular: Regular rate and rhythm with no rubs or gallops.  No thyromegaly or JVD  noted.  2+ pitting edema in lower extremities bilaterally. Respiratory: Mild rales at bases no wheezing noted.  Poor inspiratory effort. Abdomen: Soft nontender nondistended with normal bowel sounds x4 quadrants. Muskuloskeletal: No cyanosis or clubbing.  2+ pitting edema in lower extremities bilaterally. Neuro: CN II-XII intact, strength, sensation, reflexes Skin: No ulcerative lesions noted or rashes Psychiatry: Judgement and insight appear normal. Mood is appropriate for condition and setting          Labs on Admission:  Basic Metabolic Panel: Recent Labs  Lab 08/22/22 2011  NA 137  K 5.5*  CL 106  CO2 25  GLUCOSE 110*  BUN 22  CREATININE 1.10  CALCIUM 8.9   Liver Function Tests: Recent Labs  Lab 08/22/22 2011  AST 37  ALT 24  ALKPHOS 38  BILITOT 1.1  PROT 6.4*  ALBUMIN 3.7   No results for input(s): "LIPASE", "AMYLASE" in the last 168 hours. No results for input(s): "  AMMONIA" in the last 168 hours. CBC: Recent Labs  Lab 08/22/22 2011  WBC 6.3  NEUTROABS 4.0  HGB 14.2  HCT 43.8  MCV 97.1  PLT 149*   Cardiac Enzymes: Recent Labs  Lab 08/22/22 1853  CKTOTAL 203    BNP (last 3 results) Recent Labs    10/25/21 0830 08/22/22 2011  BNP 623.5* 402.7*    ProBNP (last 3 results) No results for input(s): "PROBNP" in the last 8760 hours.  CBG: No results for input(s): "GLUCAP" in the last 168 hours.  Radiological Exams on Admission: MR BRAIN WO CONTRAST  Result Date: 08/23/2022 CLINICAL DATA:  Initial evaluation for neuro deficit, stroke suspected. EXAM: MRI HEAD WITHOUT CONTRAST TECHNIQUE: Multiplanar, multiecho pulse sequences of the brain and surrounding structures were obtained without intravenous contrast. COMPARISON:  Prior CT from 08/22/2022. FINDINGS: Brain: Diffuse prominence of the CSF containing spaces compatible generalized age-related cerebral atrophy. Mild scattered T2/FLAIR hyperintensity noted involving the periventricular white matter,  most consistent with chronic small vessel ischemic disease, minimal in nature, and felt to be within normal limits for age. No evidence for acute or subacute ischemia. Gray-white matter differentiation maintained. No areas of chronic cortical infarction. No acute or chronic intracranial hemorrhage. No mass lesion, midline shift or mass effect. Mild ventricular prominence related to global parenchymal volume loss without hydrocephalus. No extra-axial fluid collection. Pituitary gland and suprasellar region within normal limits. Vascular: Major intracranial vascular flow voids are maintained. Skull and upper cervical spine: Craniocervical junction normal. Bone marrow signal intensity within normal limits. No scalp soft tissue abnormality. Sinuses/Orbits: Prior bilateral ocular lens replacement. Paranasal sinuses are largely clear. No mastoid effusion. Other: None. IMPRESSION: Normal brain MRI for age. No acute intracranial abnormality identified. Electronically Signed   By: Jeannine Boga M.D.   On: 08/23/2022 00:58   CT HEAD WO CONTRAST  Result Date: 08/22/2022 CLINICAL DATA:  Mental status change, unknown cause EXAM: CT HEAD WITHOUT CONTRAST TECHNIQUE: Contiguous axial images were obtained from the base of the skull through the vertex without intravenous contrast. RADIATION DOSE REDUCTION: This exam was performed according to the departmental dose-optimization program which includes automated exposure control, adjustment of the mA and/or kV according to patient size and/or use of iterative reconstruction technique. COMPARISON:  None Available. BRAIN: BRAIN Cerebral ventricle sizes are concordant with the degree of cerebral volume loss. Patchy and confluent areas of decreased attenuation are noted throughout the deep and periventricular white matter of the cerebral hemispheres bilaterally, compatible with chronic microvascular ischemic disease. No evidence of large-territorial acute infarction. No  parenchymal hemorrhage. No mass lesion. No extra-axial collection. No mass effect or midline shift. No hydrocephalus. Basilar cisterns are patent. Vascular: No hyperdense vessel. Skull: No acute fracture or focal lesion. Sinuses/Orbits: Paranasal sinuses and mastoid air cells are clear. Bilateral lens replacement. Otherwise the orbits are unremarkable. Other: None. IMPRESSION: No acute intracranial abnormality. Electronically Signed   By: Iven Finn M.D.   On: 08/22/2022 19:43   DG Chest Port 1 View  Result Date: 08/22/2022 CLINICAL DATA:  Generalized weakness EXAM: PORTABLE CHEST 1 VIEW COMPARISON:  10/25/2021 FINDINGS: Cardiac shadow is within normal limits. Aortic calcifications are seen. Patchy airspace opacity is again identified in the left base likely representing atelectasis. No acute bony abnormality is seen. Central vascular congestion is noted. IMPRESSION: Mild CHF with left basilar atelectasis. Electronically Signed   By: Inez Catalina M.D.   On: 08/22/2022 19:22    EKG: I independently viewed the EKG done and my  findings are as followed: Sinus tachycardia.  Rate of 102.  Nonspecific ST-T changes.  QTc 434.  Assessment/Plan Present on Admission: **None**  Principal Problem:   Generalized weakness  Generalized weakness with concern for physical deconditioning MRI brain nonacute UA is pending PT/OT assessment in the morning Fall precautions  Acute on chronic diastolic CHF Last 2D echo done on 10/26/2021 revealed normal LVEF 50 to 55% Presented with BNP greater than 400, pulmonary edema on x-ray. IV diuresing and electrolyte replacement as indicated Start strict I's and O's and daily weight Obtain a 2D echo  Permanent atrial fibrillation on Eliquis Resume home Eliquis for CVA prevention Rate controlled on home metoprolol, restart. Closely monitor on telemetry  Dysarthria Unclear baseline Speech therapy consulted Aspiration precautions  Hyperkalemia Serum potassium  5.5  BPH Resume home regimen  Hyperlipidemia Resume home regimen  Hypertension Resume home regimen Monitor vital signs   DVT prophylaxis: Subcu Lovenox daily  Code Status: Full code  Family Communication: None at bedside.  Disposition Plan: Telemetry unit  Consults called: None.  Admission status: Observation status.   Status is: Observation    Kayleen Memos MD [facility] Pager [phone]  If 7PM-7AM, please contact night-coverage [email or web address] Password [email or web address]  08/23/2022, 1:17 AM

## 2022-08-23 NOTE — Assessment & Plan Note (Addendum)
-   UA negative for signs of infection -MRI brain negative for evidence of stroke; does show underlying age-related cerebral atrophy and chronic small vessel disease - follow up PT eval; patient lives alone at baseline.  Patient declined home health PT at discharge.

## 2022-08-23 NOTE — Assessment & Plan Note (Signed)
-   Continue Zocor for now tho not sure if providing much mortality benefit at this time - Check CK in setting of weakness

## 2022-08-23 NOTE — Evaluation (Signed)
Clinical/Bedside Swallow Evaluation Patient Details  Name: Mark Stark. MRN: [id number] Date of Birth: [birth year]  Today's Date: 08/23/2022 Time: SLP Start Time (ACUTE ONLY): 6 SLP Stop Time (ACUTE ONLY): 5366 SLP Time Calculation (min) (ACUTE ONLY): 30 min  Past Medical History:  Past Medical History:  Diagnosis Date   Acute diastolic CHF (congestive heart failure) (HCC) 10/25/2021   Anxiety    Aortic insufficiency    Atrial fibrillation (HCC)    Chronic diastolic CHF (congestive heart failure) (HCC)    Chronic kidney disease, stage 3a (HCC)    Diverticulitis    Dyslipidemia    Hypertension    Inferior mesenteric vein thrombosis (HCC)    Mitral regurgitation    Nonrheumatic mitral valve regurgitation 10/28/2021   Paroxysmal atrial fibrillation ([place]) 01/12/2018   Persistent atrial fibrillation (HCC)    Prostate cancer [facility])    Past Surgical History:  Past Surgical History:  Procedure Laterality Date   CARDIOVERSION N/A 03/01/2018   Procedure: CARDIOVERSION;  Surgeon: Troy Sine, MD;  Location: [place];  Service: Cardiovascular;  Laterality: N/A;   CARDIOVERSION N/A 08/26/2021   Procedure: CARDIOVERSION;  Surgeon: Pixie Casino, MD;  Location: [place];  Service: Cardiovascular;  Laterality: N/A;   CARDIOVERSION N/A 12/06/2021   Procedure: CARDIOVERSION;  Surgeon: Donato Heinz, MD;  Location: [place];  Service: Cardiovascular;  Laterality: N/A;   CATARACT EXTRACTION  01/27/2010   right   CATARACT EXTRACTION  03/04/2010   left   Prostate seed implants     PROSTATE SURGERY     HPI:  88 yo male adm to [facility] with weakness and dysarthria.  He notes that he simply feels weaker than usual, and has been seeing things. PMH significant for CHF, anxiety, Afib, CKD III, DLD, HTN, PAF, prostate cancer. MRI brain normal for age, CXR negative for acute, atx left.  Swallow eval ordered.  Pt denies dysphagia - current or PTA. 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     Assessment / Plan / Recommendation  Clinical Impression  Normal oropharyngeal swallow ability noted per clinical swallow evaluation. Pt denies any dysphagia nor reflux -  current or PTA.  He was observed consuming 3 ounces applesauce, 2 graham crackers and 3 ounce Yale water screen as well as 4 ounces of other liquids.  He was able to brush his teeth with adequate oral control.  Subtle throat clearing noted with intake - which pt reports is his baseline and not associated with po intake.  Pt reports he takes 2 pills at time with liquids *in lieu of several* to be "safe".  Pt does endorse some memory deficits - new - and SLP advised he monitor as may be current exacerbation that will hopefully resolve.  Advised he inform his PCP If it does not resolve. 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  THanks for this referral, pt thanked me for for "thorough" evaluation. SLP Visit Diagnosis: Dysphagia, unspecified (R13.10)    Aspiration Risk  Mild aspiration risk    Diet Recommendation Regular;Thin liquid   Liquid Administration via: Cup;Straw Medication Administration: Whole meds with liquid (few at a time only) Supervision: Patient able to self feed Compensations: Slow rate;Small sips/bites Postural Changes: Seated upright at 90 degrees;Remain upright for at least 30 minutes after po intake    Other  Recommendations      Recommendations for follow up therapy are one component of a multi-disciplinary discharge planning process, led  by the attending physician.  Recommendations may be updated based on patient status, additional functional criteria and insurance authorization.  Follow up Recommendations No SLP follow up      Assistance Recommended at Discharge None  Functional Status Assessment Patient has not had a recent decline in their functional status  Frequency and Duration     N/A       Prognosis   N/A     Swallow Study   General Date of  Onset: 08/23/22 HPI: 88 yo male adm to [facility] with weakness and dysarthria.  He notes that he simply feels weaker than usual, and has been seeing things. PMH significant for CHF, anxiety, Afib, CKD III, DLD, HTN, PAF, prostate cancer. MRI brain normal for age, CXR negative for acute, atx left.  Swallow eval ordered.  Pt denies dysphagia - current or PTA. 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 Type of Study: Bedside Swallow Evaluation Diet Prior to this Study: Regular;Thin liquids Temperature Spikes Noted: No Respiratory Status: Room air History of Recent Intubation: No Behavior/Cognition: Alert;Cooperative;Pleasant mood Oral Cavity Assessment: Within Functional Limits Oral Care Completed by SLP: No Oral Cavity - Dentition: Adequate natural dentition Vision: Functional for self-feeding Self-Feeding Abilities: Able to feed self Patient Positioning: Upright in bed Baseline Vocal Quality: Normal Volitional Cough: Strong Volitional Swallow: Able to elicit    Oral/Motor/Sensory Function Overall Oral Motor/Sensory Function: Within functional limits   Ice Chips Ice chips: Not tested   Thin Liquid Thin Liquid: Within functional limits Presentation: Cup;Self Fed    Nectar Thick Nectar Thick Liquid: Not tested   Honey Thick Honey Thick Liquid: Not tested   Puree Puree: Within functional limits Presentation: Self Fed;Spoon   Solid     Solid: Within functional limits Presentation: Self Mark Stark 08/23/2022,4:21 PM  Kathleen Lime, MS [facility] SLP [place] Office [phone] Pager [phone]

## 2022-08-23 NOTE — Evaluation (Signed)
Occupational Therapy Evaluation Patient Details Name: Mark Stark. MRN: [id number] DOB: [birth year] Today's Date: 08/23/2022   History of Present Illness Mark Stark. is a 88 y.o. male with medical history significant for permanent atrial fibrillation on Eliquis, chronic diastolic CHF, [name], hypertension, hyperlipidemia, aortic insufficiency, mitral regurgitation, inferior mesenteric vein thrombosis, [name]  Also noted to have dysarthria. MRI brain was nonacute.  Chest x-ray revealed mild pulmonary edema, BNP was elevated greater than 400, serum potassium 5.5.   Clinical Impression   Mr. Mark Stark is a 88 year old man who presents with generalized weakness and impaired balance. Patient reports he is feeling closer to his baseline. He is typically independent, active and doesn't use a device. He exhibited no focal neurological deficits. He was able to perform independent toileting and standing grooming task. Required a walker with ambulation due to balance deficits but did walk away from it at times as well with no overt loss of balance. From an ADL standpoint patient is at his baseline. Would recommend intermittent supervision at discharge at least the first couple of days. Defer to PT for further recommendations in regards to balance.     Recommendations for follow up therapy are one component of a multi-disciplinary discharge planning process, led by the attending physician.  Recommendations may be updated based on patient status, additional functional criteria and insurance authorization.   Follow Up Recommendations  No OT follow up    Assistance Recommended at Discharge Intermittent Supervision/Assistance  Patient can return home with the following Help with stairs or ramp for entrance;Assist for transportation    Functional Status Assessment  Patient has had a recent decline in their  functional status and demonstrates the ability to make significant improvements in function in a reasonable and predictable amount of time.  Equipment Recommendations  None recommended by OT    Recommendations for Other Services       Precautions / Restrictions Precautions Precautions: Fall Restrictions Weight Bearing Restrictions: No      Mobility Bed Mobility                    Transfers                          Balance Overall balance assessment: Mild deficits observed, not formally tested                                         ADL either performed or assessed with clinical judgement   ADL Overall ADL's : Modified independent                                       General ADL Comments: increased time, use of walker or hand holds as needed.     Vision Baseline Vision/History: 1 Wears glasses Patient Visual Report: No change from baseline       Perception     Praxis      Pertinent Vitals/Pain Pain Assessment Pain Assessment: No/denies pain     Hand Dominance Right   Extremity/Trunk Assessment Upper Extremity Assessment Upper Extremity Assessment: RUE deficits/detail;LUE deficits/detail RUE Deficits / Details: WNL ROM, 5/5 strength RUE Sensation: WNL  RUE Coordination: WNL LUE Deficits / Details: WNL ROM, 5/5 strength LUE Sensation: WNL LUE Coordination: WNL   Lower Extremity Assessment Lower Extremity Assessment: Defer to PT evaluation   Cervical / Trunk Assessment Cervical / Trunk Assessment: Kyphotic   Communication Communication Communication: HOH   Cognition Arousal/Alertness: Awake/alert Behavior During Therapy: WFL for tasks assessed/performed Overall Cognitive Status: Within Functional Limits for tasks assessed                                 General Comments: very sharp     General Comments       Exercises     Shoulder Instructions      Home Living  Family/patient expects to be discharged to:: Private residence Living Arrangements: Alone Available Help at Discharge: Family;Friend(s);Available PRN/intermittently Type of Home: House       Home Layout: Laundry or work area in basement (has a workshop in [employer] with a pull down ladder)     Bathroom Shower/Tub: Tub/shower unit         Home Equipment: Conservation officer, nature (2 wheels);Shower seat;BSC/3in1;Cane - single point   Additional Comments: goes up in the attic daily and works in the garden. goes in to the basement to get can goods      Prior Functioning/Environment Prior Level of Function : Independent/Modified Independent                        OT Problem List: Impaired balance (sitting and/or standing)      OT Treatment/Interventions:      OT Goals(Current goals can be found in the care plan section) Acute Rehab OT Goals OT Goal Formulation: All assessment and education complete, DC therapy  OT Frequency:      Co-evaluation PT/OT/SLP Co-Evaluation/Treatment: Yes (coeval)            AM-PAC OT "6 Clicks" Daily Activity     Outcome Measure Help from another person eating meals?: None Help from another person taking care of personal grooming?: None Help from another person toileting, which includes using toliet, bedpan, or urinal?: None Help from another person bathing (including washing, rinsing, drying)?: None Help from another person to put on and taking off regular upper body clothing?: None Help from another person to put on and taking off regular lower body clothing?: None 6 Click Score: 24   End of Session Equipment Utilized During Treatment: Rolling walker (2 wheels);Gait belt  Activity Tolerance: Patient tolerated treatment well Patient left: in bed;with call bell/phone within reach  OT Visit Diagnosis: Unsteadiness on feet (R26.81)                Time: 6808-8110 OT Time Calculation (min): 36 min Charges:  OT General Charges $OT Visit: 1  Visit OT Evaluation $OT Eval Low Complexity: 1 Low  Mark Stark, OTR/L [place]  Office [phone] Pager: [place] 08/23/2022, 9:20 AM

## 2022-08-23 NOTE — ED Notes (Signed)
Patient's condom catheter leaking around sides.  Removed and PureWick applied.  Patient provided with dry sheets and gown.

## 2022-08-23 NOTE — Assessment & Plan Note (Signed)
-   Continue Flomax 

## 2022-08-23 NOTE — ED Notes (Signed)
Patient taken to MRI

## 2022-08-23 NOTE — Assessment & Plan Note (Signed)
-   Improved with diuresis 

## 2022-08-23 NOTE — Assessment & Plan Note (Addendum)
-  patient has history of CKD3a. Baseline creat ~ 1.1 - 1.3, eGFR 59 - at baseline

## 2022-08-23 NOTE — Assessment & Plan Note (Addendum)
-   workup notable for pulmonary edema, pulmonary crackles, B/L leg edema, and some SOB after further discussion with patient which has contributed to his overall generalized malaise and feeling badly - BNP 402 -Diuresed well with IV Lasix with significant lower extremity edema improvement and respiratory status -Echo repeated on admission.  EF 60 to 65%, no RWMA.  Mild LVH.  Diastolic parameters unable to be determined -Patient weighed prior to discharge.  141 pounds on standing scale

## 2022-08-23 NOTE — Hospital Course (Addendum)
Mark Stark is a 88  yo male with PMH permanent atrial fibrillation on Eliquis, chronic diastolic CHF, [name], hypertension, hyperlipidemia, aortic insufficiency, mitral regurgitation, inferior mesenteric vein thrombosis, history of prostate cancer and anxiety who presented to [facility] ED with complaints of generalized weakness.  Also noted to have dysarthria.  Due to concern for neurologic disorder, EDP discussed the case with neurology/stroke team, recommended MRI brain.  MRI brain was negative for acute findings.  Chest x-ray revealed mild pulmonary edema, BNP was elevated greater than 400, serum potassium 5.5.   Urinalysis was negative for signs of infection. He was started on Lasix for diuresis and admitted for further monitoring. He improved clinically with diuresis.  Symptoms improved and he ambulated well with physical therapy. Standing weight prior to discharge was 141 pounds.  He was informed to use this as his new dry weight when continuing on PRN torsemide at home.

## 2022-08-23 NOTE — Progress Notes (Signed)
  Echocardiogram 2D Echocardiogram has been performed.  Mark Stark M 08/23/2022, 8:28 AM

## 2022-08-23 NOTE — Assessment & Plan Note (Signed)
-   Continue Toprol 

## 2022-08-23 NOTE — Progress Notes (Addendum)
Progress Note    Mark Stark.   ZCH:[id number]  DOB: 1928-06-28  DOA: 08/22/2022     0 PCP: [name], DO  Initial CC: Weakness, change in speech  Hospital Course: Mark Stark is a 88  yo male with PMH permanent atrial fibrillation on Eliquis, chronic diastolic CHF, [name], hypertension, hyperlipidemia, aortic insufficiency, mitral regurgitation, inferior mesenteric vein thrombosis, history of prostate cancer and anxiety who presented to [facility] ED with complaints of generalized weakness.  Also noted to have dysarthria.  Due to concern for neurologic disorder, EDP discussed the case with neurology/stroke team, recommended MRI brain.  MRI brain was negative for acute findings.  Chest x-ray revealed mild pulmonary edema, BNP was elevated greater than 400, serum potassium 5.5.   Urinalysis was negative for signs of infection. He was started on Lasix for diuresis and admitted for further monitoring.  Interval History:  Seen this morning in the ER.  Still complaining of ongoing generalized malaise and weakness.  Speech has improved.  No focal weakness.  Assessment and Plan: * Acute diastolic CHF (congestive heart failure) (HCC) - workup notable for pulmonary edema, pulmonary crackles, B/L leg edema, and some SOB after further discussion with patient which has contributed to his overall generalized malaise and feeling badly - BNP 402 - continue IV diuresis with lasix and monitor output -Echo repeated on admission.  EF 60 to 65%, no RWMA.  Mild LVH.  Diastolic parameters unable to be determined  Persistent atrial fibrillation (HCC) - noted to be in RVR on admission; likely precipitated by volume overload - continue eliquis - continue Toprol - continue tele  Generalized weakness - UA negative for signs of infection -MRI brain negative for evidence of stroke; does show underlying age-related cerebral atrophy and chronic small vessel disease - continue diuresis - follow up PT eval;  patient lives alone at baseline  BPH (benign prostatic hyperplasia) - Continue Flomax  Chronic kidney disease, stage 3a ([place]) - patient has history of CKD3a. Baseline creat ~ 1.1 - 1.3, eGFR 59 - at baseline - monitor renal fxn on diuresis    Essential hypertension - Continue Toprol  Hyperlipidemia - Continue Zocor for now tho not sure if providing much mortality benefit at this time - Check CK in setting of weakness  Hyperkalemia-resolved as of 08/23/2022 - Improved with diuresis    Old records reviewed in assessment of this patient  Antimicrobials:   DVT prophylaxis:   apixaban (ELIQUIS) tablet 5 mg   Code Status:   Code Status: Full Code  Mobility Assessment (last 72 hours)     Mobility Assessment     Row Name 08/23/22 0915           What is the highest level of mobility based on the progressive mobility assessment? Level 5 (Walks with assist in room/hall) - Balance while stepping forward/back and can walk in room with assist - Complete                Barriers to discharge:  Disposition Plan: Pending PT eval Status is: Observation  Objective: Blood pressure (!) 122/92, pulse (!) 52, temperature (!) 97.4 F (36.3 C), temperature source Oral, resp. rate 19, SpO2 99 %.  Examination:  Physical Exam Constitutional:      Comments: Fatigued appearing  HENT:     Head: Normocephalic and atraumatic.     Ears:     Comments: Hearing aids in place    Mouth/Throat:     Mouth:  Mucous membranes are moist.  Eyes:     Extraocular Movements: Extraocular movements intact.  Cardiovascular:     Rate and Rhythm: Normal rate and regular rhythm.     Heart sounds: Normal heart sounds.  Pulmonary:     Effort: Pulmonary effort is normal. No respiratory distress.     Breath sounds: Rales present. No wheezing.  Abdominal:     General: Bowel sounds are normal. There is no distension.     Palpations: Abdomen is soft.     Tenderness: There is no abdominal tenderness.   Musculoskeletal:        General: Swelling present.     Cervical back: Normal range of motion and neck supple.     Comments: 1-2+ bilateral lower extremity edema noted  Skin:    General: Skin is warm and dry.  Neurological:     General: No focal deficit present.     Mental Status: He is alert.  Psychiatric:        Mood and Affect: Mood normal.        Behavior: Behavior normal.      Consultants:    Procedures:    Data Reviewed: Results for orders placed or performed during the hospital encounter of 08/22/22 (from the past 24 hour(s))  CK     Status: None   Collection Time: 08/22/22  6:53 PM  Result Value Ref Range   Total CK 203 49 - 397 U/L  Comprehensive metabolic panel     Status: Abnormal   Collection Time: 08/22/22  8:11 PM  Result Value Ref Range   Sodium 137 135 - 145 mmol/L   Potassium 5.5 (H) 3.5 - 5.1 mmol/L   Chloride 106 98 - 111 mmol/L   CO2 25 22 - 32 mmol/L   Glucose, Bld 110 (H) 70 - 99 mg/dL   BUN 22 8 - 23 mg/dL   Creatinine, Ser 1.10 0.61 - 1.24 mg/dL   Calcium 8.9 8.9 - 10.3 mg/dL   Total Protein 6.4 (L) 6.5 - 8.1 g/dL   Albumin 3.7 3.5 - 5.0 g/dL   AST 37 15 - 41 U/L   ALT 24 0 - 44 U/L   Alkaline Phosphatase 38 38 - 126 U/L   Total Bilirubin 1.1 0.3 - 1.2 mg/dL   GFR, Estimated >60 >60 mL/min   Anion gap 6 5 - 15  Brain natriuretic peptide     Status: Abnormal   Collection Time: 08/22/22  8:11 PM  Result Value Ref Range   B Natriuretic Peptide 402.7 (H) 0.0 - 100.0 pg/mL  CBC with Differential     Status: Abnormal   Collection Time: 08/22/22  8:11 PM  Result Value Ref Range   WBC 6.3 4.0 - 10.5 K/uL   RBC 4.51 4.22 - 5.81 MIL/uL   Hemoglobin 14.2 13.0 - 17.0 g/dL   HCT 43.8 39.0 - 52.0 %   MCV 97.1 80.0 - 100.0 fL   MCH 31.5 26.0 - 34.0 pg   MCHC 32.4 30.0 - 36.0 g/dL   RDW 14.4 11.5 - 15.5 %   Platelets 149 (L) 150 - 400 K/uL   nRBC 0.0 0.0 - 0.2 %   Neutrophils Relative % 63 %   Neutro Abs 4.0 1.7 - 7.7 K/uL   Lymphocytes  Relative 23 %   Lymphs Abs 1.4 0.7 - 4.0 K/uL   Monocytes Relative 12 %   Monocytes Absolute 0.8 0.1 - 1.0 K/uL   Eosinophils Relative 1 %  Eosinophils Absolute 0.0 0.0 - 0.5 K/uL   Basophils Relative 1 %   Basophils Absolute 0.1 0.0 - 0.1 K/uL   Immature Granulocytes 0 %   Abs Immature Granulocytes 0.02 0.00 - 0.07 K/uL   Burr Cells PRESENT   Lactic acid, plasma     Status: None   Collection Time: 08/23/22  3:12 AM  Result Value Ref Range   Lactic Acid, Venous 1.6 0.5 - 1.9 mmol/L  CBC     Status: None   Collection Time: 08/23/22  3:12 AM  Result Value Ref Range   WBC 6.4 4.0 - 10.5 K/uL   RBC 4.29 4.22 - 5.81 MIL/uL   Hemoglobin 13.7 13.0 - 17.0 g/dL   HCT 42.0 39.0 - 52.0 %   MCV 97.9 80.0 - 100.0 fL   MCH 31.9 26.0 - 34.0 pg   MCHC 32.6 30.0 - 36.0 g/dL   RDW 14.4 11.5 - 15.5 %   Platelets 154 150 - 400 K/uL   nRBC 0.0 0.0 - 0.2 %  Basic metabolic panel     Status: Abnormal   Collection Time: 08/23/22  3:12 AM  Result Value Ref Range   Sodium 140 135 - 145 mmol/L   Potassium 4.8 3.5 - 5.1 mmol/L   Chloride 107 98 - 111 mmol/L   CO2 24 22 - 32 mmol/L   Glucose, Bld 115 (H) 70 - 99 mg/dL   BUN 20 8 - 23 mg/dL   Creatinine, Ser 1.06 0.61 - 1.24 mg/dL   Calcium 9.1 8.9 - 10.3 mg/dL   GFR, Estimated >60 >60 mL/min   Anion gap 9 5 - 15  Magnesium     Status: None   Collection Time: 08/23/22  3:12 AM  Result Value Ref Range   Magnesium 1.9 1.7 - 2.4 mg/dL  Phosphorus     Status: None   Collection Time: 08/23/22  3:12 AM  Result Value Ref Range   Phosphorus 4.1 2.5 - 4.6 mg/dL    I have Reviewed nursing notes, Vitals, and Lab results since pt's last encounter. Pertinent lab results : see above I have ordered test including BMP, CBC, Mg I have reviewed the last note from staff over past 24 hours I have discussed pt's care plan and test results with nursing staff, case manager   LOS: 0 days   Dwyane Dee, MD [facility] 08/23/2022, 3:19 PM

## 2022-08-23 NOTE — Assessment & Plan Note (Addendum)
-   noted to be in RVR on admission; likely precipitated by volume overload - continue eliquis - continue Toprol -Rate controlled at discharge

## 2022-08-24 DIAGNOSIS — I080 Rheumatic disorders of both mitral and aortic valves: Secondary | ICD-10-CM | POA: Diagnosis present

## 2022-08-24 DIAGNOSIS — Z602 Problems related to living alone: Secondary | ICD-10-CM | POA: Diagnosis present

## 2022-08-24 DIAGNOSIS — E785 Hyperlipidemia, unspecified: Secondary | ICD-10-CM | POA: Diagnosis present

## 2022-08-24 DIAGNOSIS — I4821 Permanent atrial fibrillation: Secondary | ICD-10-CM | POA: Diagnosis present

## 2022-08-24 DIAGNOSIS — I5031 Acute diastolic (congestive) heart failure: Secondary | ICD-10-CM | POA: Diagnosis present

## 2022-08-24 DIAGNOSIS — I130 Hypertensive heart and chronic kidney disease with heart failure and stage 1 through stage 4 chronic kidney disease, or unspecified chronic kidney disease: Secondary | ICD-10-CM | POA: Diagnosis present

## 2022-08-24 DIAGNOSIS — I5033 Acute on chronic diastolic (congestive) heart failure: Secondary | ICD-10-CM | POA: Diagnosis present

## 2022-08-24 DIAGNOSIS — F419 Anxiety disorder, unspecified: Secondary | ICD-10-CM | POA: Diagnosis present

## 2022-08-24 DIAGNOSIS — Z79899 Other long term (current) drug therapy: Secondary | ICD-10-CM | POA: Diagnosis not present

## 2022-08-24 DIAGNOSIS — R471 Dysarthria and anarthria: Secondary | ICD-10-CM | POA: Diagnosis present

## 2022-08-24 DIAGNOSIS — R531 Weakness: Secondary | ICD-10-CM | POA: Diagnosis present

## 2022-08-24 DIAGNOSIS — N1831 Chronic kidney disease, stage 3a: Secondary | ICD-10-CM | POA: Diagnosis present

## 2022-08-24 DIAGNOSIS — Z882 Allergy status to sulfonamides status: Secondary | ICD-10-CM | POA: Diagnosis not present

## 2022-08-24 DIAGNOSIS — Z8546 Personal history of malignant neoplasm of prostate: Secondary | ICD-10-CM | POA: Diagnosis not present

## 2022-08-24 DIAGNOSIS — E875 Hyperkalemia: Secondary | ICD-10-CM | POA: Diagnosis present

## 2022-08-24 DIAGNOSIS — Z7901 Long term (current) use of anticoagulants: Secondary | ICD-10-CM | POA: Diagnosis not present

## 2022-08-24 DIAGNOSIS — N400 Enlarged prostate without lower urinary tract symptoms: Secondary | ICD-10-CM | POA: Diagnosis present

## 2022-08-24 LAB — BASIC METABOLIC PANEL
Anion gap: 8 (ref 5–15)
BUN: 26 mg/dL — ABNORMAL HIGH (ref 8–23)
CO2: 28 mmol/L (ref 22–32)
Calcium: 9.1 mg/dL (ref 8.9–10.3)
Chloride: 101 mmol/L (ref 98–111)
Creatinine, Ser: 1.24 mg/dL (ref 0.61–1.24)
GFR, Estimated: 55 mL/min — ABNORMAL LOW (ref >60)
Glucose, Bld: 92 mg/dL (ref 70–99)
Potassium: 3.7 mmol/L (ref 3.5–5.1)
Sodium: 137 mmol/L (ref 135–145)

## 2022-08-24 LAB — MAGNESIUM: Magnesium: 2 mg/dL (ref 1.7–2.4)

## 2022-08-24 LAB — CBC WITH DIFFERENTIAL/PLATELET
Abs Immature Granulocytes: 0.03 10*3/uL (ref 0.00–0.07)
Basophils Absolute: 0.1 10*3/uL (ref 0.0–0.1)
Basophils Relative: 1 %
Eosinophils Absolute: 0.2 10*3/uL (ref 0.0–0.5)
Eosinophils Relative: 2 %
HCT: 44.8 % (ref 39.0–52.0)
Hemoglobin: 14.9 g/dL (ref 13.0–17.0)
Immature Granulocytes: 0 %
Lymphocytes Relative: 40 %
Lymphs Abs: 3.3 10*3/uL (ref 0.7–4.0)
MCH: 31.2 pg (ref 26.0–34.0)
MCHC: 33.3 g/dL (ref 30.0–36.0)
MCV: 93.9 fL (ref 80.0–100.0)
Monocytes Absolute: 1 10*3/uL (ref 0.1–1.0)
Monocytes Relative: 12 %
Neutro Abs: 3.7 10*3/uL (ref 1.7–7.7)
Neutrophils Relative %: 45 %
Platelets: 159 10*3/uL (ref 150–400)
RBC: 4.77 MIL/uL (ref 4.22–5.81)
RDW: 13.9 % (ref 11.5–15.5)
WBC: 8.3 10*3/uL (ref 4.0–10.5)
nRBC: 0 % (ref 0.0–0.2)

## 2022-08-24 LAB — URINE CULTURE
Culture: <10000 — AB
Special Requests: NORMAL

## 2022-08-24 MED ORDER — FUROSEMIDE 10 MG/ML IJ SOLN: 20.0000 mg | Freq: Every day | INTRAMUSCULAR | Status: DC

## 2022-08-24 NOTE — Progress Notes (Signed)
Mobility Specialist - Progress Note   08/24/22 1054  Mobility  Activity Ambulated with assistance in hallway  Level of Assistance Contact guard assist, steadying assist  Assistive Device Front wheel walker  Distance Ambulated (ft) 1000 ft  Activity Response Tolerated well  $Mobility charge 1 Mobility   Pt received in chair and agreed to mobility. No c/o pain nor discomfort. Pt likes to fast walk in his free time. Pt left in chair with all needs met, call bell within reach and chair alarm set.   Roderick Pee Mobility Specialist

## 2022-08-24 NOTE — Plan of Care (Signed)

## 2022-08-24 NOTE — Progress Notes (Signed)
Progress Note    Mark Stark.   UMP:[id number]  DOB: 02-11-1928  DOA: 08/22/2022     0 PCP: [name], DO  Initial CC: Weakness, change in speech  Hospital Course: Mark Stark is a 88  yo male with PMH permanent atrial fibrillation on Eliquis, chronic diastolic CHF, [name], hypertension, hyperlipidemia, aortic insufficiency, mitral regurgitation, inferior mesenteric vein thrombosis, history of prostate cancer and anxiety who presented to [facility] ED with complaints of generalized weakness.  Also noted to have dysarthria.  Due to concern for neurologic disorder, EDP discussed the case with neurology/stroke team, recommended MRI brain.  MRI brain was negative for acute findings.  Chest x-ray revealed mild pulmonary edema, BNP was elevated greater than 400, serum potassium 5.5.   Urinalysis was negative for signs of infection. He was started on Lasix for diuresis and admitted for further monitoring.  Interval History:  No events overnight.  Patient has been diuresing well with IV Lasix.  Energy is also improved today and he has ambulated twice with mobility specialist.  Assessment and Plan: * Acute diastolic CHF (congestive heart failure) ([place]) - workup notable for pulmonary edema, pulmonary crackles, B/L leg edema, and some SOB after further discussion with patient which has contributed to his overall generalized malaise and feeling badly - BNP 402 - continue IV diuresis with lasix and monitor output -Echo repeated on admission.  EF 60 to 65%, no RWMA.  Mild LVH.  Diastolic parameters unable to be determined  Persistent atrial fibrillation (HCC) - noted to be in RVR on admission; likely precipitated by volume overload - continue eliquis - continue Toprol - continue tele  Generalized weakness - UA negative for signs of infection -MRI brain negative for evidence of stroke; does show underlying age-related cerebral atrophy and chronic small vessel disease - continue diuresis -  follow up PT eval; patient lives alone at baseline  BPH (benign prostatic hyperplasia) - Continue Flomax  Chronic kidney disease, stage 3a ([place]) - patient has history of CKD3a. Baseline creat ~ 1.1 - 1.3, eGFR 59 - at baseline - monitor renal fxn on diuresis    Essential hypertension - Continue Toprol  Hyperlipidemia - Continue Zocor for now tho not sure if providing much mortality benefit at this time - CK trended and remaining normal   Hyperkalemia-resolved as of 08/23/2022 - Improved with diuresis    Old records reviewed in assessment of this patient  Antimicrobials:   DVT prophylaxis:   apixaban (ELIQUIS) tablet 5 mg   Code Status:   Code Status: Full Code  Mobility Assessment (last 72 hours)     Mobility Assessment     Row Name 08/24/22 0945 08/23/22 1500 08/23/22 0915       Does patient have an order for bedrest or is patient medically unstable No - Continue assessment No - Continue assessment --     What is the highest level of mobility based on the progressive mobility assessment? Level 5 (Walks with assist in room/hall) - Balance while stepping forward/back and can walk in room with assist - Complete Level 5 (Walks with assist in room/hall) - Balance while stepping forward/back and can walk in room with assist - Complete Level 5 (Walks with assist in room/hall) - Balance while stepping forward/back and can walk in room with assist - Complete              Barriers to discharge:  Disposition Plan: Pending PT eval Status is: Observation  Objective: Blood  pressure 103/60, pulse 79, temperature 97.8 F (36.6 C), temperature source Oral, resp. rate 16, height 5' 7"  (1.702 m), weight 64.4 kg, SpO2 100 %.  Examination:  Physical Exam Constitutional:      Comments: Fatigued appearing  HENT:     Head: Normocephalic and atraumatic.     Ears:     Comments: Hearing aids in place    Mouth/Throat:     Mouth: Mucous membranes are moist.  Eyes:      Extraocular Movements: Extraocular movements intact.  Cardiovascular:     Rate and Rhythm: Normal rate and regular rhythm.     Heart sounds: Normal heart sounds.  Pulmonary:     Effort: Pulmonary effort is normal. No respiratory distress.     Breath sounds: No wheezing.     Comments: Improved rales Abdominal:     General: Bowel sounds are normal. There is no distension.     Palpations: Abdomen is soft.     Tenderness: There is no abdominal tenderness.  Musculoskeletal:        General: Swelling present.     Cervical back: Normal range of motion and neck supple.     Comments: Improved swelling compared to admission and is now 1+ in B/L LE (much better in LLE)  Skin:    General: Skin is warm and dry.  Neurological:     General: No focal deficit present.     Mental Status: He is alert.  Psychiatric:        Mood and Affect: Mood normal.        Behavior: Behavior normal.      Consultants:    Procedures:    Data Reviewed: Results for orders placed or performed during the hospital encounter of 08/22/22 (from the past 24 hour(s))  Basic metabolic panel     Status: Abnormal   Collection Time: 08/24/22  6:01 AM  Result Value Ref Range   Sodium 137 135 - 145 mmol/L   Potassium 3.7 3.5 - 5.1 mmol/L   Chloride 101 98 - 111 mmol/L   CO2 28 22 - 32 mmol/L   Glucose, Bld 92 70 - 99 mg/dL   BUN 26 (H) 8 - 23 mg/dL   Creatinine, Ser 1.24 0.61 - 1.24 mg/dL   Calcium 9.1 8.9 - 10.3 mg/dL   GFR, Estimated 55 (L) >60 mL/min   Anion gap 8 5 - 15  CBC with Differential/Platelet     Status: None   Collection Time: 08/24/22  6:01 AM  Result Value Ref Range   WBC 8.3 4.0 - 10.5 K/uL   RBC 4.77 4.22 - 5.81 MIL/uL   Hemoglobin 14.9 13.0 - 17.0 g/dL   HCT 44.8 39.0 - 52.0 %   MCV 93.9 80.0 - 100.0 fL   MCH 31.2 26.0 - 34.0 pg   MCHC 33.3 30.0 - 36.0 g/dL   RDW 13.9 11.5 - 15.5 %   Platelets 159 150 - 400 K/uL   nRBC 0.0 0.0 - 0.2 %   Neutrophils Relative % 45 %   Neutro Abs 3.7 1.7 -  7.7 K/uL   Lymphocytes Relative 40 %   Lymphs Abs 3.3 0.7 - 4.0 K/uL   Monocytes Relative 12 %   Monocytes Absolute 1.0 0.1 - 1.0 K/uL   Eosinophils Relative 2 %   Eosinophils Absolute 0.2 0.0 - 0.5 K/uL   Basophils Relative 1 %   Basophils Absolute 0.1 0.0 - 0.1 K/uL   Immature Granulocytes 0 %  Abs Immature Granulocytes 0.03 0.00 - 0.07 K/uL  Magnesium     Status: None   Collection Time: 08/24/22  6:01 AM  Result Value Ref Range   Magnesium 2.0 1.7 - 2.4 mg/dL    I have Reviewed nursing notes, Vitals, and Lab results since pt's last encounter. Pertinent lab results : see above I have ordered test including BMP, CBC, Mg I have reviewed the last note from staff over past 24 hours I have discussed pt's care plan and test results with nursing staff, case manager   LOS: 0 days   Dwyane Dee, MD [facility] 08/24/2022, 3:45 PM

## 2022-08-24 NOTE — Progress Notes (Signed)
Mobility Specialist - Progress Note   08/24/22 1232  Mobility  Activity Ambulated with assistance in hallway  Level of Assistance Contact guard assist, steadying assist  Assistive Device Front wheel walker  Distance Ambulated (ft) 1500 ft  Activity Response Tolerated well  $Mobility charge 1 Mobility   Pt received in recliner and agreed for mobility. No c/o pain nor discomfort during ambulation. Pt returned to chair with all needs met, call bell within reach and NT notified of session.    Roderick Pee Mobility Specialist

## 2022-08-25 LAB — CBC WITH DIFFERENTIAL/PLATELET
Abs Immature Granulocytes: 0.04 10*3/uL (ref 0.00–0.07)
Basophils Absolute: 0.1 10*3/uL (ref 0.0–0.1)
Basophils Relative: 1 %
Eosinophils Absolute: 0.2 10*3/uL (ref 0.0–0.5)
Eosinophils Relative: 3 %
HCT: 44.2 % (ref 39.0–52.0)
Hemoglobin: 14.6 g/dL (ref 13.0–17.0)
Immature Granulocytes: 1 %
Lymphocytes Relative: 34 %
Lymphs Abs: 2.4 10*3/uL (ref 0.7–4.0)
MCH: 31.3 pg (ref 26.0–34.0)
MCHC: 33 g/dL (ref 30.0–36.0)
MCV: 94.6 fL (ref 80.0–100.0)
Monocytes Absolute: 1 10*3/uL (ref 0.1–1.0)
Monocytes Relative: 14 %
Neutro Abs: 3.3 10*3/uL (ref 1.7–7.7)
Neutrophils Relative %: 47 %
Platelets: 147 10*3/uL — ABNORMAL LOW (ref 150–400)
RBC: 4.67 MIL/uL (ref 4.22–5.81)
RDW: 14.1 % (ref 11.5–15.5)
WBC: 7 10*3/uL (ref 4.0–10.5)
nRBC: 0 % (ref 0.0–0.2)

## 2022-08-25 LAB — BASIC METABOLIC PANEL
Anion gap: 6 (ref 5–15)
BUN: 32 mg/dL — ABNORMAL HIGH (ref 8–23)
CO2: 30 mmol/L (ref 22–32)
Calcium: 8.5 mg/dL — ABNORMAL LOW (ref 8.9–10.3)
Chloride: 101 mmol/L (ref 98–111)
Creatinine, Ser: 1.12 mg/dL (ref 0.61–1.24)
GFR, Estimated: >60 mL/min (ref >60)
Glucose, Bld: 94 mg/dL (ref 70–99)
Potassium: 3.6 mmol/L (ref 3.5–5.1)
Sodium: 137 mmol/L (ref 135–145)

## 2022-08-25 LAB — MAGNESIUM: Magnesium: 2 mg/dL (ref 1.7–2.4)

## 2022-08-25 NOTE — Progress Notes (Signed)
Physical Therapy Treatment Patient Details Name: Mark Stark. MRN: [id number] DOB: June 11, 1928 Today's Date: 08/25/2022   History of Present Illness Patient is 88 y.o. male pesents via EMS from home with concern for weakness, visual hallucination, decreased oral intake. Patient notes that over the day he has had progressive weakness generalized, no focality.  No pain anywhere including abdominal or chest discomfort.  No dyspnea.  He notes that he simply feels weaker than usual, and has been seeing things. PMH significant for CHF, anxiety, Afib, CKD III, DLD, HTN, PAF, prostate cancer.    PT Comments    Patient is making excellent progress with mobility and ambulated ~800' this visit. He initiated stair mobility today and was able ascend/descend 5 steps with single rail and guarding/supervision only. No LOB noted throughout gait and pt improved management of RW as gait progressed. EOS pt stood at sink to complete self care for brushing teeth and washing face. He has good support at home and is safe to return home with HHPT when medically ready.    Recommendations for follow up therapy are one component of a multi-disciplinary discharge planning process, led by the attending physician.  Recommendations may be updated based on patient status, additional functional criteria and insurance authorization.  Follow Up Recommendations  Home health PT     Assistance Recommended at Discharge Intermittent Supervision/Assistance  Patient can return home with the following A little help with walking and/or transfers;A little help with bathing/dressing/bathroom;Assistance with cooking/housework;Assist for transportation;Help with stairs or ramp for entrance   Equipment Recommendations  None recommended by PT    Recommendations for Other Services       Precautions / Restrictions Precautions Precautions: Fall Restrictions Weight Bearing Restrictions: No     Mobility  Bed Mobility Overal bed  mobility: Needs Assistance Bed Mobility: Supine to Sit     Supine to sit: Supervision     General bed mobility comments: sup for safety, HOB flat, pt using rail    Transfers Overall transfer level: Needs assistance Equipment used: Rolling walker (2 wheels) Transfers: Sit to/from Stand Sit to Stand: Supervision           General transfer comment: supervision for power up from EOB and recliner. no assist or cues needed.    Ambulation/Gait Ambulation/Gait assistance: Min guard, Supervision Gait Distance (Feet): 800 Feet Assistive device: Rolling walker (2 wheels) Gait Pattern/deviations: Step-through pattern, Decreased stride length Gait velocity: decr     General Gait Details: cues for safe proximity to RW, supervision/guarding for safety. no overt LOB noted. pt with steady pace overall, posture slightly flexed.   Stairs Stairs: Yes Stairs assistance: Min guard, Supervision Stair Management: One rail Right, Forwards, Alternating pattern Number of Stairs: 5 General stair comments: cues to use bil UE support on rail, pt prefering single. no buckling or LOB noted, pt ascend/descend with alternating pattern despite cues for step to pattern.   Wheelchair Mobility    Modified Rankin (Stroke Patients Only)       Balance Overall balance assessment: Needs assistance Sitting-balance support: Feet supported Sitting balance-Leahy Scale: Good     Standing balance support: During functional activity, Reliant on assistive device for balance, Bilateral upper extremity supported Standing balance-Leahy Scale: Fair Standing balance comment: RQ for gait                            Cognition Arousal/Alertness: Awake/alert Behavior During Therapy: WFL for tasks assessed/performed Overall Cognitive Status:  Within Functional Limits for tasks assessed                                 General Comments: pt with good memory of prior hospitalization and  current situation        Exercises      General Comments        Pertinent Vitals/Pain Pain Assessment Pain Assessment: No/denies pain    Home Living                          Prior Function            PT Goals (current goals can now be found in the care plan section) Acute Rehab PT Goals Patient Stated Goal: get home and build up strength PT Goal Formulation: With patient Time For Goal Achievement: 09/06/22 Potential to Achieve Goals: Good Progress towards PT goals: Progressing toward goals    Frequency    Min 3X/week      PT Plan Current plan remains appropriate    Co-evaluation              AM-PAC PT "6 Clicks" Mobility   Outcome Measure  Help needed turning from your back to your side while in a flat bed without using bedrails?: A Little Help needed moving from lying on your back to sitting on the side of a flat bed without using bedrails?: A Little Help needed moving to and from a bed to a chair (including a wheelchair)?: A Little Help needed standing up from a chair using your arms (e.g., wheelchair or bedside chair)?: A Little Help needed to walk in hospital room?: A Little Help needed climbing 3-5 steps with a railing? : A Little 6 Click Score: 18    End of Session Equipment Utilized During Treatment: Gait belt Activity Tolerance: Patient tolerated treatment well Patient left: in bed;with call bell/phone within reach Nurse Communication: Mobility status PT Visit Diagnosis: Muscle weakness (generalized) (M62.81);Difficulty in walking, not elsewhere classified (R26.2);Unsteadiness on feet (R26.81)     Time: 0931-1000 PT Time Calculation (min) (ACUTE ONLY): 29 min  Charges:  $Gait Training: 23-37 mins                     Verner Mould, DPT Acute Rehabilitation Services Office [phone] Pager [phone]  08/25/22 10:44 AM

## 2022-08-25 NOTE — Discharge Instructions (Signed)
Your new weight can be considered 140 - 141 pounds. We were able to help decrease the fluid around your lungs and in your legs with lasix in hospital. Continue with your original instructions for taking torsemide upon returning home but use the new weight of 141 pounds for your new baseline.

## 2022-08-25 NOTE — TOC Transition Note (Signed)
Transition of Care [facility]) - CM/SW Discharge Note   Patient Details  Name: Mark Stark. MRN: [id number] Date of Birth: 1928-02-28  Transition of Care [facility]) CM/SW Contact:  Illene Regulus, LCSW Phone Number: 08/25/2022, 11:23 AM   Clinical Narrative:    Pt is oriented x 4, CSW spoke with pt to discuss [place] services, Pt reported having [place] service in the past but does not remember the agency name. Pt has declined [place] services, Pt said, "I don't want people coming in and out of my home again." CSW informed pt it would only be for [facility] PT, pt verbalized he does not need it. Pt reported he remembers the activities they did with him before. Pt reported he lives alone however he has many friends he can call to help him in the home. Pt reported his church friend will be picking him up upon d/c. Pt reported no DME needs, pt stated he has a walker, cane, bedside commode, and other things he has from his wife's illness. No additional TOC needs at this time TOC to sign off.     Final next level of care: Home/Self Care Barriers to Discharge: No Barriers Identified   Patient Goals and CMS Choice Patient states their goals for this hospitalization and ongoing recovery are:: Return home CMS Medicare.gov Compare Post Acute Care list provided to:: Patient Choice offered to / list presented to : Patient  Discharge Placement                       Discharge Plan and Services In-house Referral: Clinical Social Work                                   Social Determinants of Health (SDOH) Interventions     Readmission Risk Interventions     No data to display

## 2022-08-26 NOTE — Discharge Summary (Signed)
Physician Discharge Summary   Mark Stark. FXT:[id number] DOB: 27-Nov-1928 DOA: 08/22/2022  PCP: [name], DO  Admit date: 08/22/2022 Discharge date: 08/25/2022  Barriers to discharge: none  Admitted From: Home Disposition:  Home Discharging physician: Dwyane Dee, MD  Recommendations for Outpatient Follow-up:  Follow up with specialists as needed  Home Health:  Equipment/Devices:   Discharge Condition: stable CODE STATUS: Full Diet recommendation:  Diet Orders (From admission, onward)     Start     Ordered   08/25/22 0000  Diet - low sodium heart healthy        08/25/22 1015            Hospital Course: Mark Stark is a 88  yo male with PMH permanent atrial fibrillation on Eliquis, chronic diastolic CHF, [name], hypertension, hyperlipidemia, aortic insufficiency, mitral regurgitation, inferior mesenteric vein thrombosis, history of prostate cancer and anxiety who presented to [facility] ED with complaints of generalized weakness.  Also noted to have dysarthria.  Due to concern for neurologic disorder, EDP discussed the case with neurology/stroke team, recommended MRI brain.  MRI brain was negative for acute findings.  Chest x-ray revealed mild pulmonary edema, BNP was elevated greater than 400, serum potassium 5.5.   Urinalysis was negative for signs of infection. He was started on Lasix for diuresis and admitted for further monitoring. He improved clinically with diuresis.  Symptoms improved and he ambulated well with physical therapy. Standing weight prior to discharge was 141 pounds.  He was informed to use this as his new dry weight when continuing on PRN torsemide at home.  Assessment and Plan: * Acute diastolic CHF (congestive heart failure) (HCC) - workup notable for pulmonary edema, pulmonary crackles, B/L leg edema, and some SOB after further discussion with patient which has contributed to his overall generalized malaise and feeling badly - BNP  402 -Diuresed well with IV Lasix with significant lower extremity edema improvement and respiratory status -Echo repeated on admission.  EF 60 to 65%, no RWMA.  Mild LVH.  Diastolic parameters unable to be determined -Patient weighed prior to discharge.  141 pounds on standing scale  Persistent atrial fibrillation (HCC) - noted to be in RVR on admission; likely precipitated by volume overload - continue eliquis - continue Toprol -Rate controlled at discharge  Generalized weakness - UA negative for signs of infection -MRI brain negative for evidence of stroke; does show underlying age-related cerebral atrophy and chronic small vessel disease - follow up PT eval; patient lives alone at baseline.  Patient declined home health PT at discharge.  BPH (benign prostatic hyperplasia) - Continue Flomax  Chronic kidney disease, stage 3a ([place]) - patient has history of CKD3a. Baseline creat ~ 1.1 - 1.3, eGFR 59 - at baseline   Essential hypertension - Continue Toprol  Hyperlipidemia - Continue Zocor for now tho not sure if providing much mortality benefit at this time - CK trended and remaining normal   Hyperkalemia-resolved as of 08/23/2022 - Improved with diuresis       The patient's chronic medical conditions were treated accordingly per the patient's home medication regimen except as noted.  On day of discharge, patient was felt deemed stable for discharge. Patient/family member advised to call PCP or come back to ER if needed.   Principal Diagnosis: Acute diastolic CHF (congestive heart failure) ([place])  Discharge Diagnoses: Active Hospital Problems   Diagnosis Date Noted   Acute diastolic CHF (congestive heart failure) ([place]) 10/25/2021    Priority:  1.   Persistent atrial fibrillation ([place])     Priority: 1.   Generalized weakness 08/22/2022    Priority: 2.   Acute diastolic (congestive) heart failure ([place]) 08/24/2022   BPH (benign prostatic hyperplasia) 08/23/2022   Chronic  kidney disease, stage 3a ([place]) 08/09/2022   Essential hypertension 05/07/2007   Hyperlipidemia 01/15/2007    Resolved Hospital Problems   Diagnosis Date Noted Date Resolved   Hyperkalemia 08/23/2022 08/23/2022     Discharge Instructions     Diet - low sodium heart healthy   Complete by: As directed    Increase activity slowly   Complete by: As directed       Allergies as of 08/25/2022       Reactions   Sulfonamide Derivatives Other (See Comments)   Unknown - patient cannot remember reaction        Medication List     TAKE these medications    ascorbic acid 500 MG tablet Commonly known as: VITAMIN C Take 500 mg by mouth daily.   b complex vitamins tablet Take 1 tablet by mouth daily.   Calcium Carbonate-Vitamin D 600-400 MG-UNIT tablet Take 1 tablet by mouth daily.   clorazepate 15 MG tablet Commonly known as: TRANXENE TAKE 1 TABLET BY MOUTH EVERYDAY AT BEDTIME What changed:  how much to take how to take this when to take this   cromolyn 5.2 MG/ACT nasal spray Commonly known as: NASALCROM Place 2 sprays into the nose 2 (two) times daily.   Eliquis 5 MG Tabs tablet Generic drug: apixaban TAKE 1 TABLET BY MOUTH TWICE A DAY What changed: how much to take   fenofibrate 160 MG tablet TAKE 1 TABLET BY MOUTH EVERY DAY   fish oil-omega-3 fatty acids 1000 MG capsule Take 1 g by mouth 3 (three) times daily.   latanoprost 0.005 % ophthalmic solution Commonly known as: XALATAN Place 1 drop into the left eye at bedtime.   metoprolol succinate 25 MG 24 hr tablet Commonly known as: Toprol XL Take 0.5 tablets (12.5 mg total) by mouth daily.   MULTIVITAMIN PO Take 1 tablet by mouth daily.   potassium chloride 10 MEQ tablet Commonly known as: KLOR-CON Take 1 tablet (10 mEq total) by mouth daily as needed (take when you take torsemide).   PreserVision AREDS 2 Caps 2 po qd What changed:  how much to take how to take this when to take this additional  instructions   simvastatin 10 MG tablet Commonly known as: ZOCOR TAKE 1 TABLET BY MOUTH EVERYDAY AT BEDTIME What changed: See the new instructions.   tamsulosin 0.4 MG Caps capsule Commonly known as: FLOMAX Take 0.4 mg by mouth at bedtime.   torsemide 20 MG tablet Commonly known as: DEMADEX Take 1 tablet (20 mg total) by mouth daily as needed (if weight up 3 pounds overnight or 5 lbs from baseline). TAKE 1 TABLET BY MOUTH EVERY DAY        Allergies  Allergen Reactions   Sulfonamide Derivatives Other (See Comments)    Unknown - patient cannot remember reaction    Consultations:   Procedures:   Discharge Exam: BP 134/78   Pulse 80   Temp (!) 97.5 F (36.4 C) (Oral)   Resp 16   Ht 5' 7"  (1.702 m)   Wt 64 kg   SpO2 98%   BMI 22.10 kg/m  Physical Exam Constitutional:      General: He is not in acute distress.    Appearance: Normal appearance.  He is not ill-appearing.  HENT:     Head: Normocephalic and atraumatic.     Ears:     Comments: Hearing aids in place    Mouth/Throat:     Mouth: Mucous membranes are moist.  Eyes:     Extraocular Movements: Extraocular movements intact.  Cardiovascular:     Rate and Rhythm: Normal rate and regular rhythm.     Heart sounds: Normal heart sounds.  Pulmonary:     Effort: Pulmonary effort is normal. No respiratory distress.     Breath sounds: No wheezing.     Comments: Improved rales Abdominal:     General: Bowel sounds are normal. There is no distension.     Palpations: Abdomen is soft.     Tenderness: There is no abdominal tenderness.  Musculoskeletal:        General: Swelling present.     Cervical back: Normal range of motion and neck supple.     Comments: Improved swelling compared to admission and is now 1+ in B/L LE (much better in LLE)  Skin:    General: Skin is warm and dry.  Neurological:     General: No focal deficit present.     Mental Status: He is alert.  Psychiatric:        Mood and Affect: Mood  normal.        Behavior: Behavior normal.      The results of significant diagnostics from this hospitalization (including imaging, microbiology, ancillary and laboratory) are listed below for reference.   Microbiology: Recent Results (from the past 240 hour(s))  Urine Culture     Status: Abnormal   Collection Time: 08/23/22  3:12 AM   Specimen: Urine, Clean Catch  Result Value Ref Range Status   Specimen Description   Final    URINE, CLEAN CATCH Performed at [facility], [place] [address] [place], [place] [address]    Special Requests   Final    Normal Performed at [facility], [place] [address], [place], [place] [address]    Culture (A)  Final    <10,000 COLONIES/mL INSIGNIFICANT GROWTH Performed at [place] [address], [place], [place] [address]    Report Status 08/24/2022 FINAL  Final     Labs: BNP (last 3 results) Recent Labs    10/25/21 0830 08/22/22 2011  BNP 623.5* 269.4*   Basic Metabolic Panel: Recent Labs  Lab 08/22/22 2011 08/23/22 0312 08/24/22 0601 08/25/22 0619  NA 137 140 137 137  K 5.5* 4.8 3.7 3.6  CL 106 107 101 101  CO2 25 24 28 30   GLUCOSE 110* 115* 92 94  BUN 22 20 26* 32*  CREATININE 1.10 1.06 1.24 1.12  CALCIUM 8.9 9.1 9.1 8.5*  MG  --  1.9 2.0 2.0  PHOS  --  4.1  --   --    Liver Function Tests: Recent Labs  Lab 08/22/22 2011  AST 37  ALT 24  ALKPHOS 38  BILITOT 1.1  PROT 6.4*  ALBUMIN 3.7   No results for input(s): "LIPASE", "AMYLASE" in the last 168 hours. No results for input(s): "AMMONIA" in the last 168 hours. CBC: Recent Labs  Lab 08/22/22 2011 08/23/22 0312 08/24/22 0601 08/25/22 0619  WBC 6.3 6.4 8.3 7.0  NEUTROABS 4.0  --  3.7 3.3  HGB 14.2 13.7 14.9 14.6  HCT 43.8 42.0 44.8 44.2  MCV 97.1 97.9 93.9 94.6  PLT 149* 154 159 147*  Cardiac Enzymes: Recent Labs  Lab 08/22/22 1853 08/23/22 0312  CKTOTAL 203 130   BNP: Invalid input(s):  "POCBNP" CBG: No results for input(s): "GLUCAP" in the last 168 hours. D-Dimer No results for input(s): "DDIMER" in the last 72 hours. Hgb A1c No results for input(s): "HGBA1C" in the last 72 hours. Lipid Profile No results for input(s): "CHOL", "HDL", "LDLCALC", "TRIG", "CHOLHDL", "LDLDIRECT" in the last 72 hours. Thyroid function studies No results for input(s): "TSH", "T4TOTAL", "T3FREE", "THYROIDAB" in the last 72 hours.  Invalid input(s): "FREET3" Anemia work up No results for input(s): "VITAMINB12", "FOLATE", "FERRITIN", "TIBC", "IRON", "RETICCTPCT" in the last 72 hours. Urinalysis    Component Value Date/Time   COLORURINE YELLOW 08/22/2022 0310   APPEARANCEUR HAZY (A) 08/22/2022 0310   LABSPEC 1.012 08/22/2022 0310   PHURINE 8.0 08/22/2022 0310   GLUCOSEU NEGATIVE 08/22/2022 0310   HGBUR NEGATIVE 08/22/2022 0310   HGBUR negative 09/23/2010 0838   BILIRUBINUR NEGATIVE 08/22/2022 0310   BILIRUBINUR neg 12/21/2015 1426   KETONESUR NEGATIVE 08/22/2022 0310   PROTEINUR NEGATIVE 08/22/2022 0310   UROBILINOGEN 0.2 12/21/2015 1426   UROBILINOGEN negative 09/23/2010 0838   NITRITE NEGATIVE 08/22/2022 0310   LEUKOCYTESUR NEGATIVE 08/22/2022 0310   Sepsis Labs Recent Labs  Lab 08/22/22 2011 08/23/22 0312 08/24/22 0601 08/25/22 0619  WBC 6.3 6.4 8.3 7.0   Microbiology Recent Results (from the past 240 hour(s))  Urine Culture     Status: Abnormal   Collection Time: 08/23/22  3:12 AM   Specimen: Urine, Clean Catch  Result Value Ref Range Status   Specimen Description   Final    URINE, CLEAN CATCH Performed at [facility], [place] [address], [place], [place] [address]    Special Requests   Final    Normal Performed at [facility], [place] [address] [place], [place] [address]    Culture (A)  Final    <10,000 COLONIES/mL INSIGNIFICANT GROWTH Performed at [place] [address], [place], [place] [address]    Report  Status 08/24/2022 FINAL  Final    Procedures/Studies: ECHOCARDIOGRAM COMPLETE  Result Date: 08/23/2022    ECHOCARDIOGRAM REPORT   Patient Name:   Mark Stark. Date of Exam: 08/23/2022 Medical Rec #:  [id number]       Height:       66.0 in Accession #:    [id number]      Weight:       152.6 lb Date of Birth:  1928/04/30        BSA:          1.783 m Patient Age:    89 years        BP:           136/105 mmHg Patient Gender: M               HR:           84 bpm. Exam Location:  Inpatient Procedure: 2D Echo, Cardiac Doppler and Color Doppler Indications:    CHF-Acute Diastolic Y80.16  History:        Patient has prior history of Echocardiogram examinations, most                 recent 10/26/2021. Aortic Valve Disease and Mitral Valve Disease,                 Arrythmias:Atrial Fibrillation; Risk Factors:Hypertension and  Dyslipidemia. Chronic kidney disease. Generalized weakness.  Sonographer:    Darlina Sicilian RDCS Referring Phys: [id number] Kayleen Memos  Sonographer Comments: Restricted patient mobility. IMPRESSIONS  1. Left ventricular ejection fraction, by estimation, is 60 to 65%. The left ventricle has normal function. The left ventricle has no regional wall motion abnormalities. There is mild concentric left ventricular hypertrophy. Left ventricular diastolic function could not be evaluated.  2. Right ventricular systolic function is normal. The right ventricular size is normal. There is mildly elevated pulmonary artery systolic pressure. The estimated right ventricular systolic pressure is 38.3 mmHg.  3. Left atrial size was severely dilated.  4. Right atrial size was mildly dilated.  5. The mitral valve is degenerative. Severe mitral valve regurgitation. Mild mitral stenosis. The mean mitral valve gradient is 4.2 mmHg with average heart rate of 103 bpm. Moderate mitral annular calcification.  6. The aortic valve is tricuspid. There is moderate calcification of the aortic valve. There is moderate  thickening of the aortic valve. Aortic valve regurgitation is mild to moderate. Mild aortic valve stenosis. Comparison(s): No significant change from prior study. Prior images reviewed side by side. FINDINGS  Left Ventricle: Left ventricular ejection fraction, by estimation, is 60 to 65%. The left ventricle has normal function. The left ventricle has no regional wall motion abnormalities. The left ventricular internal cavity size was normal in size. There is  mild concentric left ventricular hypertrophy. Left ventricular diastolic function could not be evaluated due to atrial fibrillation. Left ventricular diastolic function could not be evaluated. Right Ventricle: The right ventricular size is normal. No increase in right ventricular wall thickness. Right ventricular systolic function is normal. There is mildly elevated pulmonary artery systolic pressure. The tricuspid regurgitant velocity is 2.93  m/s, and with an assumed right atrial pressure of 8 mmHg, the estimated right ventricular systolic pressure is 81.8 mmHg. Left Atrium: Left atrial size was severely dilated. Right Atrium: Right atrial size was mildly dilated. Pericardium: There is no evidence of pericardial effusion. Mitral Valve: Quantitation of MR is less accurate due to atrial fibrillation, but effective regurgitant orifice area is calculated at 0.35 cm sq, regurgitant volume 62 ml, regurgitant fraction 55%. The mitral valve is degenerative in appearance. There is  mild thickening of the mitral valve leaflet(s). Moderate mitral annular calcification. Severe mitral valve regurgitation, with centrally-directed jet. Mild mitral valve stenosis. The mean mitral valve gradient is 4.2 mmHg with average heart rate of 103 bpm. Tricuspid Valve: The tricuspid valve is normal in structure. Tricuspid valve regurgitation is mild. Aortic Valve: The left and right coronary cusps are almost motionless, the noncoronary cusp moves freely. There is mild aortic stenosis  (paradoxical low flow low gradient). The aortic valve is tricuspid. There is moderate calcification of the aortic valve. There is moderate thickening of the aortic valve. Aortic valve regurgitation is mild to moderate. Aortic regurgitation PHT measures 459 msec. Mild aortic stenosis is present. Aortic valve mean gradient measures 5.8 mmHg. Aortic valve peak gradient  measures 10.5 mmHg. Aortic valve area, by VTI measures 1.63 cm. Pulmonic Valve: The pulmonic valve was normal in structure. Pulmonic valve regurgitation is trivial. Aorta: The aortic root and ascending aorta are structurally normal, with no evidence of dilitation. IAS/Shunts: No atrial level shunt detected by color flow Doppler.  LEFT VENTRICLE PLAX 2D LVIDd:         4.20 cm   Diastology LVIDs:         2.70 cm   LV e' medial:  5.99 cm/s LV PW:         1.30 cm   LV E/e' medial:  22.9 LV IVS:        1.30 cm   LV e' lateral:   9.56 cm/s LVOT diam:     2.20 cm   LV E/e' lateral: 14.3 LV SV:         48 LV SV Index:   27 LVOT Area:     3.80 cm  RIGHT VENTRICLE RV S prime:     10.70 cm/s TAPSE (M-mode): 1.4 cm LEFT ATRIUM              Index        RIGHT ATRIUM           Index LA diam:        5.50 cm  3.09 cm/m   RA Area:     19.20 cm LA Vol (A2C):   126.0 ml 70.68 ml/m  RA Volume:   47.90 ml  26.87 ml/m LA Vol (A4C):   129.0 ml 72.37 ml/m LA Biplane Vol: 131.0 ml 73.49 ml/m  AORTIC VALVE AV Area (Vmax):    1.59 cm AV Area (Vmean):   1.59 cm AV Area (VTI):     1.63 cm AV Vmax:           162.00 cm/s AV Vmean:          107.900 cm/s AV VTI:            0.292 m AV Peak Grad:      10.5 mmHg AV Mean Grad:      5.8 mmHg LVOT Vmax:         67.62 cm/s LVOT Vmean:        45.075 cm/s LVOT VTI:          0.125 m LVOT/AV VTI ratio: 0.43 AI PHT:            459 msec  AORTA Ao Root diam: 3.70 cm Ao Asc diam:  3.70 cm MITRAL VALVE                  TRICUSPID VALVE MV Area (PHT): 4.02 cm       TR Peak grad:   34.3 mmHg MV Mean grad:  4.2 mmHg       TR Vmax:         293.00 cm/s MV Decel Time: 189 msec MR Peak grad:    122.8 mmHg   SHUNTS MR Mean grad:    86.5 mmHg    Systemic VTI:  0.13 m MR Vmax:         554.00 cm/s  Systemic Diam: 2.20 cm MR Vmean:        438.5 cm/s MR PISA:         4.02 cm MR PISA Eff ROA: 29 mm MR PISA Radius:  0.80 cm MV E velocity: 137.00 cm/s Dani Gobble Croitoru MD Electronically signed by Sanda Klein MD Signature Date/Time: 08/23/2022/10:46:54 AM    Final    MR BRAIN WO CONTRAST  Result Date: 08/23/2022 CLINICAL DATA:  Initial evaluation for neuro deficit, stroke suspected. EXAM: MRI HEAD WITHOUT CONTRAST TECHNIQUE: Multiplanar, multiecho pulse sequences of the brain and surrounding structures were obtained without intravenous contrast. COMPARISON:  Prior CT from 08/22/2022. FINDINGS: Brain: Diffuse prominence of the CSF containing spaces compatible generalized age-related cerebral atrophy. Mild scattered T2/FLAIR hyperintensity noted involving the periventricular white matter, most consistent with chronic small vessel ischemic disease, minimal in nature,  and felt to be within normal limits for age. No evidence for acute or subacute ischemia. Gray-white matter differentiation maintained. No areas of chronic cortical infarction. No acute or chronic intracranial hemorrhage. No mass lesion, midline shift or mass effect. Mild ventricular prominence related to global parenchymal volume loss without hydrocephalus. No extra-axial fluid collection. Pituitary gland and suprasellar region within normal limits. Vascular: Major intracranial vascular flow voids are maintained. Skull and upper cervical spine: Craniocervical junction normal. Bone marrow signal intensity within normal limits. No scalp soft tissue abnormality. Sinuses/Orbits: Prior bilateral ocular lens replacement. Paranasal sinuses are largely clear. No mastoid effusion. Other: None. IMPRESSION: Normal brain MRI for age. No acute intracranial abnormality identified. Electronically Signed   By:  Jeannine Boga M.D.   On: 08/23/2022 00:58   CT HEAD WO CONTRAST  Result Date: 08/22/2022 CLINICAL DATA:  Mental status change, unknown cause EXAM: CT HEAD WITHOUT CONTRAST TECHNIQUE: Contiguous axial images were obtained from the base of the skull through the vertex without intravenous contrast. RADIATION DOSE REDUCTION: This exam was performed according to the departmental dose-optimization program which includes automated exposure control, adjustment of the mA and/or kV according to patient size and/or use of iterative reconstruction technique. COMPARISON:  None Available. BRAIN: BRAIN Cerebral ventricle sizes are concordant with the degree of cerebral volume loss. Patchy and confluent areas of decreased attenuation are noted throughout the deep and periventricular white matter of the cerebral hemispheres bilaterally, compatible with chronic microvascular ischemic disease. No evidence of large-territorial acute infarction. No parenchymal hemorrhage. No mass lesion. No extra-axial collection. No mass effect or midline shift. No hydrocephalus. Basilar cisterns are patent. Vascular: No hyperdense vessel. Skull: No acute fracture or focal lesion. Sinuses/Orbits: Paranasal sinuses and mastoid air cells are clear. Bilateral lens replacement. Otherwise the orbits are unremarkable. Other: None. IMPRESSION: No acute intracranial abnormality. Electronically Signed   By: Iven Finn M.D.   On: 08/22/2022 19:43   DG Chest Port 1 View  Result Date: 08/22/2022 CLINICAL DATA:  Generalized weakness EXAM: PORTABLE CHEST 1 VIEW COMPARISON:  10/25/2021 FINDINGS: Cardiac shadow is within normal limits. Aortic calcifications are seen. Patchy airspace opacity is again identified in the left base likely representing atelectasis. No acute bony abnormality is seen. Central vascular congestion is noted. IMPRESSION: Mild CHF with left basilar atelectasis. Electronically Signed   By: Inez Catalina M.D.   On: 08/22/2022  19:22     Time coordinating discharge: Over 30 minutes    Dwyane Dee, MD  [facility] 08/26/2022, 2:00 PM

## 2022-08-29 ENCOUNTER — Encounter: Payer: Self-pay | Admitting: *Deleted

## 2022-08-29 ENCOUNTER — Telehealth: Payer: Self-pay | Admitting: Cardiology

## 2022-08-29 ENCOUNTER — Telehealth: Payer: Self-pay | Admitting: *Deleted

## 2022-08-29 ENCOUNTER — Ambulatory Visit: Payer: Self-pay | Admitting: Licensed Clinical Social Worker

## 2022-08-29 NOTE — Patient Instructions (Signed)
Visit Information  Thank you for taking time to visit with me today. Please don't hesitate to contact me if I can be of assistance to you before our next scheduled telephone appointment.  Following are the goals we discussed today:   Our next appointment is by telephone on 09/26/22 at 2:00 PM   Please call the care guide team at [phone] if you need to cancel or reschedule your appointment.   If you are experiencing a Mental Health or [place] or need someone to talk to, please go to [facility] [place] [phone])   Following is a copy of your full plan of care:   Care Coordination Interventions:  Active listening / Reflection utilized  Problem Solving [place] reviewed Informed client of Care Coordination program support. Reviewed with client role of LCSW, RN and pharmacist with program. Client and LCSW spoke of client recent hospitalization. Client said he was starting to feel stronger now. LCSW gave client name of LCSW and phone number of LCSW. Client wrote down this information. Client is hard of hearing  Client also has support of friend, Mark Stark  Mark Stark was given information about Care Management services by the embedded care coordination team including:  Care Management services include personalized support from designated clinical staff supervised by his physician, including individualized plan of care and coordination with other care providers 24/7 contact phone numbers for assistance for urgent and routine care needs. The patient may stop CCM services at any time (effective at the end of the month) by phone call to the office staff.  Patient agreed to services and verbal consent obtained.   [name].Tyreonna Czaplicki MSW, [place] representative [facility] Care Management [phone]

## 2022-08-29 NOTE — Patient Outreach (Addendum)
  Care Coordination [facility] Note Transition Care Management Follow-up Telephone Call Date of discharge and from where: 08/25/22 [facility] How have you been since you were released from the hospital? "I am doing okay; I am weighing myself every day and my weights are stable, right at 140 lbs; I have twisted my back and am having some soreness, but it is getting better- that happens to me every now and then, but other than that, I am fine, I am just taking my time and moving slow; I am checking my blood pressures every day and they are fine so far too; I am going to talk with Dr. Etter Sjogren about everything when I see her on Thursday" Any questions or concerns? No  Items Reviewed: Did the pt receive and understand the discharge instructions provided? Yes  Medications obtained and verified? Yes  Other? No  N/A Any new allergies since your discharge? No  Dietary orders reviewed? Yes Do you have support at home? No  lives alone  Home Care and Equipment/Supplies: Were home health services ordered? no If so, what is the name of the agency? N/A- patient declined need for home health services while hospitalized  Has the agency set up a time to come to the patient's home? not applicable Were any new equipment or medical supplies ordered?  No What is the name of the medical supply agency? N/A Were you able to get the supplies/equipment? not applicable Do you have any questions related to the use of the equipment or supplies? No N/A  Functional Questionnaire: (I = Independent and D = Dependent) ADLs: I  Bathing/Dressing- I  Meal Prep- I  Eating- I  Maintaining continence- I  Transferring/Ambulation- I  Managing Meds- I  Follow up appointments reviewed:  PCP Hospital f/u appt confirmed? Yes  Scheduled to see Thursday on 08/31/22 @ 11:00 [facility] f/u appt confirmed? No  Scheduled to see - on - @ - Are transportation arrangements needed? No  If their condition worsens, is the pt  aware to call PCP or go to the Emergency Dept.? Yes Was the patient provided with contact information for the PCP's office or ED? Yes Was to pt encouraged to call back with questions or concerns? Yes  SDOH assessments and interventions completed:   Yes  Care Coordination Interventions Activated:  Yes   Care Coordination Interventions:   Reviewed weight gain guidelines and corresponding action plan in setting of CHF; reviewed post-hospital discharge weights and blood pressures at home     Encounter Outcome:  Pt. Visit Completed

## 2022-08-29 NOTE — Telephone Encounter (Signed)
Pt states he was seen at [facility] recently and would like to speak to Dr. Harrell Gave or nurse about further treatment and his diagnosis

## 2022-08-29 NOTE — Patient Outreach (Signed)
  Care Coordination   Follow Up Visit Note   08/29/2022 Name: Ajani Rineer. MRN: [id number] DOB: [birth year]  [name] is a 88 y.o. year old male who sees [name], [name], DO for primary care. I spoke with  [name] by phone today.  What matters to the patients health and wellness today?  Client wants to complete ADLs as able. Client wants to socialize with friends as able.    Goals Addressed               This Visit's Progress     Patient wants to continue to complete daily ADLs as able.  Client wants to socialize with friends as he is able. (pt-stated)        Care Coordination Interventions:  Active listening / Reflection utilized  Problem Solving [place] reviewed Informed client of Care Coordination program support. Reviewed with client role of LCSW, RN and pharmacist with program. Client and LCSW spoke of client recent hospitalization. Client said he was starting to feel stronger now. LCSW gave client name of LCSW and phone number of LCSW. Client wrote down this information. Client is hard of hearing  Client also has support of friend, [name]     SDOH assessments and interventions completed:  Yes  SDOH Interventions Today    Flowsheet Row Most Recent Value  SDOH Interventions   Stress Interventions Provide Counseling  Social Connections Interventions Other (Comment) Client enjoys socializing with others. LCSW talked with client about socialization with friends        Care Coordination Interventions Activated:  Yes  Care Coordination Interventions:  Yes, provided   Follow up plan: Follow up call scheduled for 09/26/22 at 2:00 PM    Encounter Outcome:  Pt. Visit Completed

## 2022-08-29 NOTE — Telephone Encounter (Signed)
Left message to call back  

## 2022-08-30 NOTE — Telephone Encounter (Signed)
Patient returned LPN's call. 

## 2022-08-30 NOTE — Telephone Encounter (Signed)
Returned call to patient,   Patient states he wanted to make sure that Dr. Harrell Gave was aware that patient was in the hospital for several days last week for his CHF. When patient was in the hospital he was given lasix, he was able to get a large amount of fluid off, he said this was very surprising to him. He feels like he had a good handle on his diet and fluid intake that was helping to control his fluid levels. He said this raised the question on whether or not he should be taking a diuretic regularly. He does have a prescription for Torsemide. He was told to take it if the weight got above a certain point. He states that he never reached this point and has not taken any. He is concerned that he was filling up with fluid and was not aware of it. Dr. In hospital told him to reduce weight threshold from 145 to 140 and if he reached much above 140 or 3lbs overnight or 5 pounds in a week then he should take Torsemide. Patient states this is very troublesome to him because he thought he was controlling his fluid better than this. He also endorses a dry cough with this as well.   Patient would like Dr. Harrell Gave to give her advice on whether or not he should go back to Torsemide daily. He states he used to do this previously but it was changed to PRN due to symptomatic low blood pressures. His current prescription is '20mg'$  and he states the pill is scored so he wondered if he could take a '10mg'$  dose daily to keep from fluid reaccumulating.   Recent BP and weight log for the last 5 days including while in hospital until today.  112/83  117/86 119/80 123/81 117/80  Patient is concerned about his diastolic being too high. O2 sats are all 96 and higher. Pulse ranges from 80-110. Patient feels this is too high as well   180.4 181.4 140 140.6 139.4  Will route to Dr. Harrell Gave and Isac Caddy, RN for advice.

## 2022-08-31 ENCOUNTER — Encounter: Payer: Self-pay | Admitting: Family Medicine

## 2022-08-31 ENCOUNTER — Ambulatory Visit (INDEPENDENT_AMBULATORY_CARE_PROVIDER_SITE_OTHER): Payer: Medicare Other | Admitting: Family Medicine

## 2022-08-31 VITALS — BP 118/80 | HR 112 | Temp 97.6°F | Resp 20 | Ht 67.0 in | Wt 145.8 lb

## 2022-08-31 DIAGNOSIS — I5033 Acute on chronic diastolic (congestive) heart failure: Secondary | ICD-10-CM

## 2022-08-31 DIAGNOSIS — I5031 Acute diastolic (congestive) heart failure: Secondary | ICD-10-CM

## 2022-08-31 LAB — COMPREHENSIVE METABOLIC PANEL
ALT: 15 U/L (ref 0–53)
AST: 26 U/L (ref 0–37)
Albumin: 3.8 g/dL (ref 3.5–5.2)
Alkaline Phosphatase: 44 U/L (ref 39–117)
BUN: 27 mg/dL — ABNORMAL HIGH (ref 6–23)
CO2: 28 mEq/L (ref 19–32)
Calcium: 9.3 mg/dL (ref 8.4–10.5)
Chloride: 101 mEq/L (ref 96–112)
Creatinine, Ser: 1.22 mg/dL (ref 0.40–1.50)
GFR: 51.59 mL/min — ABNORMAL LOW (ref >60.00)
Glucose, Bld: 82 mg/dL (ref 70–99)
Potassium: 4.7 mEq/L (ref 3.5–5.1)
Sodium: 136 mEq/L (ref 135–145)
Total Bilirubin: 0.9 mg/dL (ref 0.2–1.2)
Total Protein: 6.5 g/dL (ref 6.0–8.3)

## 2022-08-31 LAB — CBC WITH DIFFERENTIAL/PLATELET
Basophils Absolute: 0 10*3/uL (ref 0.0–0.1)
Basophils Relative: 0.4 % (ref 0.0–3.0)
Eosinophils Absolute: 0.1 10*3/uL (ref 0.0–0.7)
Eosinophils Relative: 1.1 % (ref 0.0–5.0)
HCT: 43.9 % (ref 39.0–52.0)
Hemoglobin: 14.6 g/dL (ref 13.0–17.0)
Lymphocytes Relative: 26.8 % (ref 12.0–46.0)
Lymphs Abs: 1.9 10*3/uL (ref 0.7–4.0)
MCHC: 33.4 g/dL (ref 30.0–36.0)
MCV: 93.5 fl (ref 78.0–100.0)
Monocytes Absolute: 1.1 10*3/uL — ABNORMAL HIGH (ref 0.1–1.0)
Monocytes Relative: 15.4 % — ABNORMAL HIGH (ref 3.0–12.0)
Neutro Abs: 3.9 10*3/uL (ref 1.4–7.7)
Neutrophils Relative %: 56.3 % (ref 43.0–77.0)
Platelets: 178 10*3/uL (ref 150.0–400.0)
RBC: 4.69 Mil/uL (ref 4.22–5.81)
RDW: 14.3 % (ref 11.5–15.5)
WBC: 7 10*3/uL (ref 4.0–10.5)

## 2022-08-31 NOTE — Patient Instructions (Signed)
Edema  Edema is an abnormal buildup of fluids in the body tissues and under the skin. Swelling of the legs, feet, and ankles is a common symptom that becomes more likely as you get older. Swelling is also common in looser tissues, such as around the eyes. Pressing on the area may make a temporary dent in your skin (pitting edema). This fluid may also accumulate in your lungs (pulmonary edema). There are many possible causes of edema. Eating too much salt (sodium) and being on your feet or sitting for a long time can cause edema in your legs, feet, and ankles. Common causes of edema include: Certain medical conditions, such as heart failure, liver or kidney disease, and cancer. Weak leg blood vessels. An injury. Pregnancy. Medicines. Being obese. Low protein levels in the blood. Hot weather may make edema worse. Edema is usually painless. Your skin may look swollen or shiny. Follow these instructions at home: Medicines Take over-the-counter and prescription medicines only as told by your health care provider. Your health care provider may prescribe a medicine to help your body get rid of extra water (diuretic). Take this medicine if you are told to take it. Eating and drinking Eat a low-salt (low-sodium) diet to reduce fluid as told by your health care provider. Sometimes, eating less salt may reduce swelling. Depending on the cause of your swelling, you may need to limit how much fluid you drink (fluid restriction). General instructions Raise (elevate) the injured area above the level of your heart while you are sitting or lying down. Do not sit still or stand for long periods of time. Do not wear tight clothing. Do not wear garters on your upper legs. Exercise your legs to get your circulation going. This helps to move the fluid back into your blood vessels, and it may help the swelling go down. Wear compression stockings as told by your health care provider. These stockings help to prevent  blood clots and reduce swelling in your legs. It is important that these are the correct size. These stockings should be prescribed by your health care provider to prevent possible injuries. If elastic bandages or wraps are recommended, use them as told by your health care provider. Contact a health care provider if: Your edema does not get better with treatment. You have heart, liver, or kidney disease and have symptoms of edema. You have sudden and unexplained weight gain. Get help right away if: You develop shortness of breath or chest pain. You cannot breathe when you lie down. You develop pain, redness, or warmth in the swollen areas. You have heart, liver, or kidney disease and suddenly get edema. You have a fever and your symptoms suddenly get worse. These symptoms may be an emergency. Get help right away. Call 911. Do not wait to see if the symptoms will go away. Do not drive yourself to the hospital. Summary Edema is an abnormal buildup of fluids in the body tissues and under the skin. Eating too much salt (sodium)and being on your feet or sitting for a long time can cause edema in your legs, feet, and ankles. Raise (elevate) the injured area above the level of your heart while you are sitting or lying down. Follow your health care provider's instructions about diet and how much fluid you can drink. This information is not intended to replace advice given to you by your health care provider. Make sure you discuss any questions you have with your health care provider. Document Revised: 08/15/2021 Document   Reviewed: 08/15/2021 Elsevier Patient Education  2023 Elsevier Inc.  

## 2022-08-31 NOTE — Progress Notes (Signed)
Established Patient Office Visit  Subjective   Patient ID: Mark Speakman., male    DOB: Feb 06, 1928  Age: 88 y.o. MRN: [id number]  Chief Complaint  Patient presents with   Hospitalization Follow-up    Dysarthria and weakness    HPI Pt is here f/u hosp for chf.  He is feeling much better.  He was given new parameters for his weight and if he gains more than 3 lbs in a day or 5 lbs in a week he needs to take the torsemide----  he needs to keep his weight around 140.  He is weighing every day.   Pt was in hosp 8/29-9/1 for chf    he is feeling much better.   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   Diverticulitis 08/09/2022   Chronic kidney disease, stage 3a ([place]) 08/09/2022   Primary open angle glaucoma (POAG) of left eye, moderate stage 06/08/2022   Contusion of left orbit 01/05/2022   Chronic diastolic CHF (congestive heart failure) ([place]) 11/09/2021   Nonrheumatic mitral valve regurgitation 10/28/2021   Acute kidney injury superimposed on CKD ([place]) 15/17/6160   Acute diastolic CHF (congestive heart failure) ([place]) 10/25/2021   Secondary hypercoagulable state ([place]) 08/23/2021   Prostate CA ([place]) 07/28/2021   Thrombocytopenia ([place]) 03/30/2021   Screening for HIV (human immunodeficiency virus) 03/30/2021   Monocytosis 03/30/2021   Abnormal platelets ([place]) 03/28/2021   Hip pain 03/28/2021   Dry eyes, bilateral 05/27/2020   Epiretinal membrane (ERM) of both eyes 12/03/2019   Persistent atrial fibrillation (HCC)    Paroxysmal atrial fibrillation ([place]) 01/12/2018   Cystoid macular edema of right eye 11/29/2017   Macular degeneration 12/21/2015   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 eye with active choroidal neovascularization ([place]) 04/30/2014   Myalgia and  myositis 11/16/2013   Epiretinal membrane 11/07/2012   Posterior vitreous detachment 11/07/2012   Status post intraocular lens implant 11/07/2012   Fungal ear infection 04/09/2012   Abnormal laboratory test 03/27/2011   Aortic insufficiency 73/71/0626   SYSTOLIC MURMUR 94/85/4627   EDEMA 12/05/2010   ECCHYMOSES, SPONTANEOUS 11/28/2010   CONTUSION, RIGHT THIGH 11/28/2010   DECREASED HEARING, BILATERAL 09/23/2010   Pain in limb 07/28/2009   ANKLE SPRAIN, LEFT 07/28/2009   CONTUSION, LOWER LEG, LEFT 07/28/2009   DERMATITIS, CONTACT, NOS 10/02/2007   Essential hypertension 05/07/2007   PROSTATE CANCER, HX OF 03/13/2007   Hyperlipidemia 01/15/2007   Anxiety state 01/15/2007   DIVERTICULITIS, HX OF 01/15/2007   Past Medical History:  Diagnosis Date   Acute diastolic CHF (congestive heart failure) ([place]) 10/25/2021   Anxiety    Aortic insufficiency    Atrial fibrillation (HCC)    Chronic diastolic CHF (congestive heart failure) (HCC)    Chronic kidney disease, stage 3a (HCC)    Diverticulitis    Dyslipidemia    Hypertension    Inferior mesenteric vein thrombosis (HCC)    Mitral regurgitation    Nonrheumatic mitral valve regurgitation 10/28/2021   Paroxysmal atrial fibrillation ([place]) 01/12/2018   Persistent atrial fibrillation ([place])    Prostate cancer [facility])    Past Surgical History:  Procedure Laterality Date   CARDIOVERSION N/A 03/01/2018   Procedure: CARDIOVERSION;  Surgeon: Troy Sine, MD;  Location: [facility];  Service: Cardiovascular;  Laterality: N/A;   CARDIOVERSION N/A 08/26/2021  Procedure: CARDIOVERSION;  Surgeon: Pixie Casino, MD;  [facility];  Service: Cardiovascular;  Laterality: N/A;   CARDIOVERSION N/A 12/06/2021   Procedure: CARDIOVERSION;  Surgeon: Donato Heinz, MD;  Location: [facility];  Service: Cardiovascular;  Laterality: N/A;   CATARACT EXTRACTION  01/27/2010   right   CATARACT EXTRACTION  03/04/2010   left   Prostate  seed implants     PROSTATE SURGERY     Social History   Tobacco Use   Smoking status: Never   Smokeless tobacco: Never  Vaping Use   Vaping Use: Never used  Substance Use Topics   Alcohol use: No    Comment: Little wine occasionally    Drug use: No   Social History   Socioeconomic History   Marital status: Widowed    Spouse name: Not on file   Number of children: Not on file   Years of education: Not on file   Highest education level: Not on file  Occupational History   Occupation: Professor---RETIRED    Employer: RETIRED    Comment: Retired  Tobacco Use   Smoking status: Never   Smokeless tobacco: Never  Vaping Use   Vaping Use: Never used  Substance and Sexual Activity   Alcohol use: No    Comment: Little wine occasionally    Drug use: No   Sexual activity: Never    Partners: Female  Other Topics Concern   Not on file  Social History Narrative   Not on file   Social Determinants of Health   Financial Resource Strain: Low Risk  (02/17/2022)   Overall Financial Resource Strain (CARDIA)    Difficulty of Paying Living Expenses: Not hard at all  Food Insecurity: No Food Insecurity (08/29/2022)   Hunger Vital Sign    Worried About Running Out of Food in the Last Year: Never true    [place] in the Last Year: Never true  Transportation Needs: No Transportation Needs (08/29/2022)   PRAPARE - Hydrologist (Medical): No    Lack of Transportation (Non-Medical): No  Physical Activity: Sufficiently Active (02/17/2022)   Exercise Vital Sign    Days of Exercise per Week: 7 days    Minutes of Exercise per Session: 40 min  Stress: Stress Concern Present (08/29/2022)   [place]    Feeling of Stress : To some extent  Social Connections: Moderately Isolated (08/29/2022)   Social Connection and Isolation Panel [NHANES]    Frequency of Communication with Friends and Family: Three  times a week    Frequency of Social Gatherings with Friends and Family: Twice a week    Attends Religious Services: More than 4 times per year    Active Member of [employer] or Organizations: No    Attends Archivist Meetings: Never    Marital Status: Widowed  Intimate Partner Violence: Not At Risk (02/17/2022)   Humiliation, Afraid, Rape, and Kick questionnaire    Fear of Current or Ex-Partner: No    Emotionally Abused: No    Physically Abused: No    Sexually Abused: No   Family Status  Relation Name Status   Mother  Deceased   Father  Deceased   Brother  Deceased   MGM  Deceased   MGF  Deceased   PGM  Deceased   PGF  Deceased   Family History  Problem Relation Age of Onset   Dementia Mother  Alzheimer's disease Mother    Mental illness Mother        ALZ DISEASE   Cancer Brother 97       pancreatic cancer   Allergies  Allergen Reactions   Sulfonamide Derivatives Other (See Comments)    Unknown - patient cannot remember reaction      Review of Systems  Constitutional:  Negative for fever and malaise/fatigue.  HENT:  Negative for congestion.   Eyes:  Negative for blurred vision.  Respiratory:  Negative for shortness of breath.   Cardiovascular:  Negative for chest pain, palpitations and leg swelling.  Gastrointestinal:  Negative for abdominal pain, blood in stool and nausea.  Genitourinary:  Negative for dysuria and frequency.  Musculoskeletal:  Negative for falls.  Skin:  Negative for rash.  Neurological:  Negative for dizziness, loss of consciousness and headaches.  Endo/Heme/Allergies:  Negative for environmental allergies.  Psychiatric/Behavioral:  Negative for depression. The patient is not nervous/anxious.       Objective:     BP 118/80 (BP Location: Left Arm, Patient Position: Sitting, Cuff Size: Normal)   Pulse (!) 112   Temp 97.6 F (36.4 C) (Oral)   Resp 20   Ht '5\' 7"'$  (1.702 m)   Wt 145 lb 12.8 oz (66.1 kg)   SpO2 98%   BMI 22.84 kg/m   BP Readings from Last 3 Encounters:  08/31/22 118/80  08/25/22 134/78  08/14/22 128/76   Wt Readings from Last 3 Encounters:  08/31/22 145 lb 12.8 oz (66.1 kg)  08/25/22 141 lb 1.5 oz (64 kg)  08/14/22 152 lb 9.6 oz (69.2 kg)   SpO2 Readings from Last 3 Encounters:  08/31/22 98%  08/25/22 98%  08/10/22 95%      Physical Exam Vitals and nursing note reviewed.  Constitutional:      Appearance: He is well-developed.  HENT:     Head: Normocephalic and atraumatic.  Eyes:     Pupils: Pupils are equal, round, and reactive to light.  Neck:     Thyroid: No thyromegaly.  Cardiovascular:     Rate and Rhythm: Normal rate and regular rhythm.     Heart sounds: No murmur heard. Pulmonary:     Effort: Pulmonary effort is normal. No respiratory distress.     Breath sounds: Normal breath sounds. No wheezing or rales.  Chest:     Chest wall: No tenderness.  Musculoskeletal:     Cervical back: Normal range of motion and neck supple.     Right hip: Tenderness present. Normal range of motion. Normal strength.     Left hip: Tenderness present. Normal range of motion. Normal strength.     Right foot: Bony tenderness present. No swelling.     Left foot: Bony tenderness present. No swelling.  Skin:    General: Skin is warm and dry.  Neurological:     Mental Status: He is alert and oriented to person, place, and time.  Psychiatric:        Behavior: Behavior normal.        Thought Content: Thought content normal.        Judgment: Judgment normal.      No results found for any visits on 08/31/22.  Last CBC Lab Results  Component Value Date   WBC 7.0 08/25/2022   HGB 14.6 08/25/2022   HCT 44.2 08/25/2022   MCV 94.6 08/25/2022   MCH 31.3 08/25/2022   RDW 14.1 08/25/2022   PLT 147 (L) 08/25/2022   Last  metabolic panel Lab Results  Component Value Date   GLUCOSE 94 08/25/2022   NA 137 08/25/2022   K 3.6 08/25/2022   CL 101 08/25/2022   CO2 30 08/25/2022   BUN 32 (H)  08/25/2022   CREATININE 1.12 08/25/2022   GFRNONAA >60 08/25/2022   CALCIUM 8.5 (L) 08/25/2022   PHOS 4.1 08/23/2022   PROT 6.4 (L) 08/22/2022   ALBUMIN 3.7 08/22/2022   BILITOT 1.1 08/22/2022   ALKPHOS 38 08/22/2022   AST 37 08/22/2022   ALT 24 08/22/2022   ANIONGAP 6 08/25/2022   Last lipids Lab Results  Component Value Date   CHOL 108 08/10/2022   HDL 47.60 08/10/2022   LDLCALC 48 08/10/2022   TRIG 61.0 08/10/2022   CHOLHDL 2 08/10/2022   Last hemoglobin A1c Lab Results  Component Value Date   HGBA1C 5.0 05/21/2012   Last thyroid functions Lab Results  Component Value Date   TSH 2.517 10/25/2021   Last vitamin D No results found for: "25OHVITD2", "25OHVITD3", "VD25OH" Last vitamin B12 and Folate Lab Results  Component Value Date   [id number] 03/30/2021      The ASCVD Risk score (Arnett DK, et al., 2019) failed to calculate for the following reasons:   The 2019 ASCVD risk score is only valid for ages 66 to 56    Assessment & Plan:   Problem List Items Addressed This Visit       Unprioritized   Acute diastolic CHF (congestive heart failure) (HCC)    Cont; diuretics con't metoprolol F/u cardiology con't to weigh daily      Other Visit Diagnoses     Acute on chronic diastolic congestive heart failure (HCC)    -  Primary   Relevant Orders   CBC with Differential/Platelet   Comprehensive metabolic panel       Return if symptoms worsen or fail to improve, for as scheduled.    [name], DO

## 2022-08-31 NOTE — Assessment & Plan Note (Signed)
Cont; diuretics con't metoprolol F/u cardiology con't to weigh daily

## 2022-09-04 NOTE — Telephone Encounter (Signed)
Returned call to patient to provide the following recommendations. Patient states his goal is weight of 140, he took a torsemide and potassium yesterday and weight today was 139. Patient states he wants to continue his torsemide along with daily weights. He states that when he took it everyday before it made his blood pressure too low and and was symptomatic. Patient scheduled to see Dr. Harrell Gave Thursday 9/14 at 2:20pm.      "Can we bring him in for a follow up visit? I am ok with the as needed torsemide for now (only for weight gain), and his blood pressures are fine, but I'd like to review everything in person with either [facility] or I. Thanks."

## 2022-09-04 NOTE — Telephone Encounter (Signed)
Can we bring him in for a follow up visit? I am ok with the as needed torsemide for now (only for weight gain), and his blood pressures are fine, but I'd like to review everything in person with either [facility] or I. Thanks.

## 2022-09-06 ENCOUNTER — Ambulatory Visit: Payer: Medicare Other | Admitting: Pharmacist

## 2022-09-06 DIAGNOSIS — I10 Essential (primary) hypertension: Secondary | ICD-10-CM

## 2022-09-06 DIAGNOSIS — I5031 Acute diastolic (congestive) heart failure: Secondary | ICD-10-CM

## 2022-09-06 DIAGNOSIS — E785 Hyperlipidemia, unspecified: Secondary | ICD-10-CM

## 2022-09-06 NOTE — Chronic Care Management (AMB) (Signed)
Care coordination Pharmacy Note  09/06/2022 Name:  Mark Stark. MRN:  [id number] DOB:  [birth year]  Summary: Patient was referred to pharmacy to discuss medications. Patient recently hospitalized for acute CHF exacerbation. Patient is interesting in education to try to prevent future exacerbations of CHF.  Reviewed symptoms of CHF and when to call office to get instructions about when to increase furosemide.  Reviewed signs and symptoms of CHF exacerbation - weight gain, shortness of breath, abdominal fullness, swelling in legs or abdomen, Fatigue and weakness, changes in ability to perform usual activities, persistent cough or wheezing with white or pink blood-tinged mucus, nausea and lack of appetite Continue to weigh daily - report weight gain of more than 3 lbs in 24 hours or 5 lbs in 1 week. Recommended weight goal per hospital discharge 140lbs. Patient reports weight since leaving hospital has been between 139lbs to 144lbs. Patient is very concerned about preventing hospitalization, dehydration and low blood pressure.  Discussed with patient potential options to prevent CHF exacerbations:   Could add SLGT2 like [place] or Jardiance and keep torsemide as needed.   Change torsemide to 28m daily once a day (with possible 269mdose for 1 to 2 days when weight increases over 3lbs in 24 hours or 5lb in 1 week)  Patient will see Dr ChHarrell Gaveomorrow and will discuss further with her.   Hyperlipidemia - mixed. Patient is taking simvastatin, fenofibrate and fish oil 2 grams per day. LDL at goal. Tg have been < 100 for the last 2 years. HDLC has been > 40. Could consider either stopping or lowering dose of fenofibrate to 54/4527maily. Discussed upcoming vaccines. Recommended annual flu vaccine and COVID booster.  Subjective: Mark Stark an [age]. year old male who is a primary patient of LowAnn HeldO.  The CCM team was consulted for assistance with disease management  and care coordination needs.    Engaged with patient by telephone for follow up visit in response to provider referral for pharmacy case management and/or care coordination services.   Consent to Services:  The patient was given information about Care Coordination services, agreed to services, and gave verbal consent prior to initiation of services.  Please see initial visit note for detailed documentation.   Patient Care Team: LowCarollee HertervoAlferd ApaO as PCP - General ChrBuford DresserD as PCP - Cardiology (Cardiology) GreGerarda FractionD as Referring Physician (Ophthalmology) LupDruscilla BrownieD as Consulting Physician (Dermatology) NewRemi HaggardD as Consulting Physician (Urology) NewRemi HaggardD as Consulting Physician (Urology)  Recent Visits:  08/31/2022 Fam Med (Dr LowCarollee Herterospital follow up. CMP checked. No med changes noted.   Recent Hospitalizations: 08/22/2022 to [facility] / pulmonary [facility]. Diuresed with IV Lasix.  No medication changes noted.   Objective:  Lab Results  Component Value Date   CREATININE 1.22 08/31/2022   CREATININE 1.12 08/25/2022   CREATININE 1.24 08/24/2022    Lab Results  Component Value Date   HGBA1C 5.0 05/21/2012   Last diabetic Eye exam: No results found for: "HMDIABEYEEXA"  Last diabetic Foot exam: No results found for: "HMDIABFOOTEX"      Component Value Date/Time   CHOL 108 08/10/2022 1344   TRIG 61.0 08/10/2022 1344   HDL 47.60 08/10/2022 1344   CHOLHDL 2 08/10/2022 1344   VLDL 12.2 08/10/2022 1344   LDLCALC 48 08/10/2022 1344   LDLCALC 36  02/10/2022 1401       Latest Ref Rng & Units 08/31/2022   11:20 AM 08/22/2022    8:11 PM 08/10/2022    1:44 PM  Hepatic Function  Total Protein 6.0 - 8.3 g/dL 6.5  6.4  6.4   Albumin 3.5 - 5.2 g/dL 3.8  3.7  4.1   AST 0 - 37 U/L 26  37  32   ALT 0 - 53 U/L 15  24  21    Alk Phosphatase 39 - 117 U/L 44  38   42   Total Bilirubin 0.2 - 1.2 mg/dL 0.9  1.1  1.3     Lab Results  Component Value Date/Time   TSH 2.517 10/25/2021 06:30 PM   TSH 1.830 01/25/2018 09:53 AM       Latest Ref Rng & Units 08/31/2022   11:20 AM 08/25/2022    6:19 AM 08/24/2022    6:01 AM  CBC  WBC 4.0 - 10.5 K/uL 7.0  7.0  8.3   Hemoglobin 13.0 - 17.0 g/dL 14.6  14.6  14.9   Hematocrit 39.0 - 52.0 % 43.9  44.2  44.8   Platelets 150.0 - 400.0 K/uL 178.0  147  159     No results found for: "VD25OH"  Clinical ASCVD: No  The ASCVD Risk score (Arnett DK, et al., 2019) failed to calculate for the following reasons:   The 2019 ASCVD risk score is only valid for ages 33 to 71    Other: CHADS2VASc = 4  Social History   Tobacco Use  Smoking Status Never  Smokeless Tobacco Never   BP Readings from Last 3 Encounters:  08/31/22 118/80  08/25/22 134/78  08/14/22 128/76   Pulse Readings from Last 3 Encounters:  08/31/22 (!) 112  08/25/22 80  08/14/22 (!) 114   Wt Readings from Last 3 Encounters:  08/31/22 145 lb 12.8 oz (66.1 kg)  08/25/22 141 lb 1.5 oz (64 kg)  08/14/22 152 lb 9.6 oz (69.2 kg)    Assessment: Review of patient past medical history, allergies, medications, health status, including review of consultants reports, laboratory and other test data, was performed as part of comprehensive evaluation and provision of chronic care management services.   SDOH:  (Social Determinants of Health) assessments and interventions performed:  SDOH Interventions    Flowsheet Row Care Coordination from 08/29/2022 in [place] Most recent reading at 08/29/2022 12:56 PM Telephone from 08/29/2022 in [place] Most recent reading at 08/29/2022 11:49 AM Care Coordination from 08/01/2022 in [place] Most recent reading at 08/01/2022  3:09 PM  SDOH Interventions     Food Insecurity Interventions --  Intervention Not Indicated --  Transportation Interventions -- Intervention Not Indicated  [drives self] --  Stress Interventions Provide Counseling -- Provide Counseling  [client has some stress related to managing medical needs]  Social Connections Interventions Other (Comment) -- --       CCM Care Plan  Allergies  Allergen Reactions   Sulfonamide Derivatives Other (See Comments)    Unknown - patient cannot remember reaction    Medications Reviewed Today     Reviewed by Cherre Robins, RPH-CPP (Pharmacist) on 09/06/22 at 1328  Med List Status: <None>   Medication Order Taking? Sig Documenting Provider Last Dose Status Informant  b complex vitamins tablet [id number] Yes Take 1 tablet by mouth daily. [provider] Taking Active Multiple Informants, Pharmacy Records  Calcium  Carbonate-Vitamin D 600-400 MG-UNIT tablet [id number] Yes Take 1 tablet by mouth daily. [provider] Taking Active Multiple Informants, Pharmacy Records  clorazepate (TRANXENE) 15 MG tablet [id number] Yes TAKE 1 TABLET BY MOUTH EVERYDAY AT BEDTIME  Patient taking differently: Take 15 mg by mouth at bedtime. TAKE 1 TABLET BY MOUTH EVERYDAY AT BEDTIME   [name], DO Taking Active Multiple Informants, Pharmacy Records  cromolyn (NASALCROM) 5.2 MG/ACT nasal spray [id number]  Place 2 sprays into the nose 2 (two) times daily. [provider]  Active Multiple Informants, Pharmacy Records  ELIQUIS 5 MG TABS tablet [id number] Yes TAKE 1 TABLET BY MOUTH TWICE A DAY  Patient taking differently: Take 5 mg by mouth 2 (two) times daily.   [name], DO Taking Active Multiple Informants, Pharmacy Records  fenofibrate 160 MG tablet [id number] Yes TAKE 1 TABLET BY MOUTH EVERY DAY  Patient taking differently: Take 160 mg by mouth daily.   [name], DO Taking Active Multiple Informants, Pharmacy Records  fish oil-omega-3 fatty acids 1000 MG capsule [id number] Yes Take 1 g by  mouth 3 (three) times daily. [provider] Taking Active Multiple Informants, Pharmacy Records           Med Note Luretha Rued   Tue Nov 08, 2021 11:52 AM) Reports takes twice a day.  latanoprost (XALATAN) 0.005 % ophthalmic solution [id number] Yes Place 1 drop into the left eye at bedtime. [provider] Taking Active Multiple Informants, Pharmacy Records  metoprolol succinate (TOPROL XL) 25 MG 24 hr tablet [id number] Yes Take 0.5 tablets (12.5 mg total) by mouth daily. Buford Dresser, MD Taking Active Multiple Informants, Pharmacy Records  Multiple Vitamins-Minerals (MULTIVITAMIN PO) [id number] Yes Take 1 tablet by mouth daily. [provider] Taking Active Multiple Informants, Pharmacy Records  Multiple Vitamins-Minerals (PRESERVISION AREDS 2) CAPS [id number] Yes 2 po qd  Patient taking differently: Take 1 tablet by mouth in the morning and at bedtime.   [name], DO Taking Active Multiple Informants, Pharmacy Records  potassium chloride (KLOR-CON) 10 MEQ tablet [id number] Yes Take 1 tablet (10 mEq total) by mouth daily as needed (take when you take torsemide). Buford Dresser, MD Taking Active Multiple Informants, Pharmacy Records  simvastatin (ZOCOR) 10 MG tablet [id number] Yes TAKE 1 TABLET BY MOUTH EVERYDAY AT BEDTIME  Patient taking differently: Take 10 mg by mouth at bedtime.   Buford Dresser, MD Taking Active Multiple Informants, Pharmacy Records  tamsulosin [facility]) 0.4 MG CAPS capsule [id number] Yes Take 0.4 mg by mouth at bedtime. [provider] Taking Active Multiple Informants, Pharmacy Records  torsemide (DEMADEX) 20 MG tablet [id number] Yes Take 1 tablet (20 mg total) by mouth daily as needed (if weight up 3 pounds overnight or 5 lbs from baseline). TAKE 1 TABLET BY MOUTH EVERY DAY  Patient taking differently: Take 20 mg by mouth daily as needed (if weight up 3 pounds overnight or 5 lbs from baseline).    Buford Dresser, MD Taking Active Multiple Informants, Pharmacy Records  vitamin C (ASCORBIC ACID) 500 MG tablet [id number] Yes Take 500 mg by mouth daily. 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   Diverticulitis 08/09/2022  Chronic kidney disease, stage 3a ([place]) 08/09/2022   Primary open angle glaucoma (POAG) of left eye, moderate stage 06/08/2022   Contusion of left orbit 01/05/2022   Chronic diastolic CHF (congestive heart failure) ([place]) 11/09/2021   Nonrheumatic mitral valve regurgitation 10/28/2021   Acute kidney injury superimposed on CKD ([place]) 69/79/4801   Acute diastolic CHF (congestive heart failure) ([place]) 10/25/2021   Secondary hypercoagulable state ([place]) 08/23/2021   Prostate CA ([place]) 07/28/2021   Thrombocytopenia ([place]) 03/30/2021   Screening for HIV (human immunodeficiency virus) 03/30/2021   Monocytosis 03/30/2021   Abnormal platelets (HCC) 03/28/2021   Hip pain 03/28/2021   Dry eyes, bilateral 05/27/2020   Epiretinal membrane (ERM) of both eyes 12/03/2019   Persistent atrial fibrillation (HCC)    Paroxysmal atrial fibrillation ([place]) 01/12/2018   Cystoid macular edema of right eye 11/29/2017   Macular degeneration 12/21/2015   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 eye with active choroidal neovascularization ([place]) 04/30/2014   Myalgia and myositis 11/16/2013   Epiretinal membrane 11/07/2012   Posterior vitreous detachment 11/07/2012   Status post intraocular lens implant 11/07/2012   Fungal ear infection 04/09/2012   Abnormal laboratory test 03/27/2011   Aortic insufficiency 65/53/7482   SYSTOLIC MURMUR 70/78/6754   EDEMA 12/05/2010    ECCHYMOSES, SPONTANEOUS 11/28/2010   CONTUSION, RIGHT THIGH 11/28/2010   DECREASED HEARING, BILATERAL 09/23/2010   Pain in limb 07/28/2009   ANKLE SPRAIN, LEFT 07/28/2009   CONTUSION, LOWER LEG, LEFT 07/28/2009   DERMATITIS, CONTACT, NOS 10/02/2007   Essential hypertension 05/07/2007   PROSTATE CANCER, HX OF 03/13/2007   Hyperlipidemia 01/15/2007   Anxiety state 01/15/2007   DIVERTICULITIS, HX OF 01/15/2007    Immunization History  Administered Date(s) Administered   Influenza Whole 11/14/2007, 09/30/2008, 09/08/2009, 08/31/2010, 09/03/2013   Influenza, High Dose Seasonal PF 08/19/2015, 09/07/2017, 08/05/2019   Influenza-Unspecified 09/07/2014, 09/15/2016, 08/03/2020   PFIZER Comirnaty(Gray Top)Covid-19 Tri-Sucrose Vaccine 04/11/2021   PFIZER(Purple Top)SARS-COV-2 Vaccination 01/18/2020, 02/09/2020, 09/14/2020   Pfizer Covid-19 Vaccine Bivalent Booster 38yr & up 10/04/2021, 05/15/2022   Pneumococcal Conjugate-13 09/07/2014   Pneumococcal Polysaccharide-23 09/22/2009   Tdap 12/25/2012   Zoster Recombinat (Shingrix) 02/17/2022, 05/15/2022   Zoster, Live 03/19/2008    Conditions to be addressed/monitored: Atrial Fibrillation, CHF, HTN, HLD, Hypertriglyceridemia, Anxiety, BPH, and macular degeneration   Medication Assistance: None required.  Patient affirms current coverage meets needs.  Patient's preferred pharmacy is:  [id number] [place]: [phone]: [phone] [place]one: [phone]: [phone] Follow Up:  Patient requests only occasional phone follow up at this time / declined to make set appointment.   Plan: Patient provided Clinical Pharmacist Practitioner phone number he will call if he has medication related questions in future.    TaCherre RobinsPharmD Clinical  Pharmacist [facility] [place]

## 2022-09-07 ENCOUNTER — Encounter (HOSPITAL_BASED_OUTPATIENT_CLINIC_OR_DEPARTMENT_OTHER): Payer: Self-pay | Admitting: Cardiology

## 2022-09-07 ENCOUNTER — Ambulatory Visit (INDEPENDENT_AMBULATORY_CARE_PROVIDER_SITE_OTHER): Payer: Medicare Other | Admitting: Cardiology

## 2022-09-07 VITALS — BP 122/76 | HR 123 | Ht 67.0 in | Wt 147.5 lb

## 2022-09-07 DIAGNOSIS — I340 Nonrheumatic mitral (valve) insufficiency: Secondary | ICD-10-CM | POA: Diagnosis not present

## 2022-09-07 DIAGNOSIS — Z79899 Other long term (current) drug therapy: Secondary | ICD-10-CM

## 2022-09-07 DIAGNOSIS — I5032 Chronic diastolic (congestive) heart failure: Secondary | ICD-10-CM

## 2022-09-07 DIAGNOSIS — D6869 Other thrombophilia: Secondary | ICD-10-CM

## 2022-09-07 DIAGNOSIS — I480 Paroxysmal atrial fibrillation: Secondary | ICD-10-CM

## 2022-09-07 DIAGNOSIS — Z7901 Long term (current) use of anticoagulants: Secondary | ICD-10-CM | POA: Diagnosis not present

## 2022-09-07 DIAGNOSIS — I4821 Permanent atrial fibrillation: Secondary | ICD-10-CM | POA: Diagnosis not present

## 2022-09-07 NOTE — Patient Instructions (Addendum)
Medication Instructions:  For the next few weeks, try taking torsemide 20 mg on Tuesdays and Fridays. Take potassium when you take the torsemide. We will review the log of your weights and how you feel with this, and we will also recheck your labs when you come back.  *If you need a refill on your cardiac medications before your next appointment, please call your pharmacy*   Lab Work: Your provider has recommended lab work in 3 weeks (BMP). Please have this collected at [facility] [place]. The lab is open 8:00 am - 4:30 pm. Please avoid 12:00p - 1:00p for lunch hour. You do not need an appointment. Please go to [address] [place], [place] [address]. This is in the Primary Care office on the 3rd floor, let them know you are there for blood work and they will direct you to the lab.   If you have labs (blood work) drawn today and your tests are completely normal, you will receive your results only by: [place] (if you have MyChart) OR A paper copy in the mail If you have any lab test that is abnormal or we need to change your treatment, we will call you to review the results.   Testing/Procedures: None ordered today   Follow-Up: At [facility], you and your health needs are our priority.  As part of our continuing mission to provide you with exceptional heart care, we have created designated Provider Care Teams.  These Care Teams include your primary Cardiologist (physician) and Advanced Practice Providers (APPs -  Physician Assistants and Nurse Practitioners) who all work together to provide you with the care you need, when you need it.  We recommend signing up for the patient portal called "MyChart".  Sign up information is provided on this After Visit Summary.  MyChart is used to connect with patients for Virtual Visits (Telemedicine).  Patients are able to view lab/test results, encounter notes, upcoming appointments, etc.  Non-urgent  messages can be sent to your provider as well.   To learn more about what you can do with MyChart, go to [email or web address].    Your next appointment:   3 week(s)  The format for your next appointment:   In Person  Provider:   Buford Dresser, MD or Owens Shark, NP

## 2022-09-07 NOTE — Progress Notes (Signed)
Cardiology Office Note:    Date:  09/07/2022   ID:  Mark Riles., DOB July 19, 1928, MRN [id number]  PCP:  [name], [name], DO  Cardiologist:  Buford Dresser, MD  Referring MD: [name], [name], *   CC: Follow up  History of Present Illness:    Mark Stark. is a 88 y.o. male with a hx of permanent atrial fibrillation, chronic diastolic CHF, CKD stage 3a, hypertension, dyslipidemia, aortic insufficiency, mitral regurgitation, inferior mesenteric vein thrombosis, prostate cancer, and anxiety, who is seen for follow up.   Cardiovascular history: On presentation to the hospital 10/25/2021 he complained of worsening shortness of breath, LE edema, and orthopnea. CXR revealed small bilateral effusions and mild interstitial edema. He was admitted for congestive heart failure. He did well for approximately 9 mos, but he was admitted to the hospital on 08/22/2022 after calling EMS due to progressive generalized weakness. It was also noted he was "seeing things". He denied any chest or abdominal pain and/or discomfort. EMS noted his blood pressure was low on arrival and improved with fluid therapy. He did exhibit dysarthria on examination at the ER. Given his symptoms and history he was admitted from the ER to be treated in the hospital and undergo further testing. Both initial MRI and and additional MRI showed no evidence of CVA. Symptoms improved   Today: After being in the hospital he was feeling well at first. The last few days, especially yesterday and today so far, the symptoms of CHF seem to have returned. Yesterday he had trouble drawing a deep breathe and a cough. Starting 3-4 days ago he noticed some symptoms returning. He took one dose of  torsemide when he noticed this and it improved his symptoms. He did urinate a lot after taking the torsemide. In the days since then he has been feeling bad again. He feels that it may be a good idea to take the torsemide daily.   He is still  experience edema in his ankles but it is improved since last visit. He reports that this never completely resolves.   His weight prior to admission was 146 lbs. Weight at discharge 140.8 lbs. Weight today 142.7 lbs. BP 110s/70s. HR 70s-100s. Took a torsemide, weight went from 142 to 139.9 lbs.  He is very concerned about reaccumulating fluid. Discussed PRN torsemide for weight of 142 lbs vs. standing doses. He would prefer standing doses over PRN. After discussion, recommend he take torsemide 20 mg twice a week (decided on Tuesday/Friday).  He denies any palpitations, chest pain, shortness of breath. No lightheadedness, headaches, syncope, orthopnea, or PND.    Past Medical History:  Diagnosis Date   Acute diastolic CHF (congestive heart failure) (HCC) 10/25/2021   Anxiety    Aortic insufficiency    Atrial fibrillation (HCC)    Chronic diastolic CHF (congestive heart failure) (HCC)    Chronic kidney disease, stage 3a (HCC)    Diverticulitis    Dyslipidemia    Hypertension    Inferior mesenteric vein thrombosis (HCC)    Mitral regurgitation    Nonrheumatic mitral valve regurgitation 10/28/2021   Paroxysmal atrial fibrillation ([place]) 01/12/2018   Persistent atrial fibrillation ([place])    Prostate cancer [facility])     Past Surgical History:  Procedure Laterality Date   CARDIOVERSION N/A 03/01/2018   Procedure: CARDIOVERSION;  Surgeon: Troy Sine, MD;  Location: [place];  Service: Cardiovascular;  Laterality: N/A;   CARDIOVERSION N/A 08/26/2021   Procedure: CARDIOVERSION;  Surgeon: Pixie Casino, MD;  Location: [facility]COPY;  Service: Cardiovascular;  Laterality: N/A;   CARDIOVERSION N/A 12/06/2021   Procedure: CARDIOVERSION;  Surgeon: Donato Heinz, MD;  Location: [facility];  Service: Cardiovascular;  Laterality: N/A;   CATARACT EXTRACTION  01/27/2010   right   CATARACT EXTRACTION  03/04/2010   left   Prostate seed implants     PROSTATE SURGERY      Current  Medications: Current Outpatient Medications on File Prior to Visit  Medication Sig   b complex vitamins tablet Take 1 tablet by mouth daily.   Calcium Carbonate-Vitamin D 600-400 MG-UNIT tablet Take 1 tablet by mouth daily.   clorazepate (TRANXENE) 15 MG tablet TAKE 1 TABLET BY MOUTH EVERYDAY AT BEDTIME (Patient taking differently: Take 15 mg by mouth at bedtime. TAKE 1 TABLET BY MOUTH EVERYDAY AT BEDTIME)   cromolyn (NASALCROM) 5.2 MG/ACT nasal spray Place 2 sprays into the nose 2 (two) times daily.   ELIQUIS 5 MG TABS tablet TAKE 1 TABLET BY MOUTH TWICE A DAY (Patient taking differently: Take 5 mg by mouth 2 (two) times daily.)   fenofibrate 160 MG tablet TAKE 1 TABLET BY MOUTH EVERY DAY (Patient taking differently: Take 160 mg by mouth daily.)   fish oil-omega-3 fatty acids 1000 MG capsule Take 1 g by mouth 3 (three) times daily.   latanoprost (XALATAN) 0.005 % ophthalmic solution Place 1 drop into the left eye at bedtime.   metoprolol succinate (TOPROL XL) 25 MG 24 hr tablet Take 0.5 tablets (12.5 mg total) by mouth daily.   Multiple Vitamins-Minerals (MULTIVITAMIN PO) Take 1 tablet by mouth daily.   Multiple Vitamins-Minerals (PRESERVISION AREDS 2) CAPS 2 po qd (Patient taking differently: Take 1 tablet by mouth in the morning and at bedtime.)   potassium chloride (KLOR-CON) 10 MEQ tablet Take 1 tablet (10 mEq total) by mouth daily as needed (take when you take torsemide).   simvastatin (ZOCOR) 10 MG tablet TAKE 1 TABLET BY MOUTH EVERYDAY AT BEDTIME (Patient taking differently: Take 10 mg by mouth at bedtime.)   tamsulosin (FLOMAX) 0.4 MG CAPS capsule Take 0.4 mg by mouth at bedtime.   torsemide (DEMADEX) 20 MG tablet Take 1 tablet (20 mg total) by mouth daily as needed (if weight up 3 pounds overnight or 5 lbs from baseline). TAKE 1 TABLET BY MOUTH EVERY DAY (Patient taking differently: Take 20 mg by mouth daily as needed (if weight up 3 pounds overnight or 5 lbs from baseline).)   vitamin C  (ASCORBIC ACID) 500 MG tablet Take 500 mg by mouth daily.   No current facility-administered medications on file prior to visit.     Allergies:   Sulfonamide derivatives   Social History   Tobacco Use   Smoking status: Never   Smokeless tobacco: Never  Vaping Use   Vaping Use: Never used  Substance Use Topics   Alcohol use: No    Comment: Little wine occasionally    Drug use: No    Family History: family history includes Alzheimer's disease in his mother; Cancer (age of onset: 45) in his brother; Dementia in his mother; Mental illness in his mother.  ROS:   Please see the history of present illness. (+) bilateral LE edema ankle  All other systems are reviewed and negative.    EKGs/Labs/Other Studies Reviewed:    The following studies were reviewed today:  Echo 08/23/2022: IMPRESSIONS    1. Left ventricular ejection fraction, by estimation, is 60 to 65%.  The  left ventricle has normal function. The left ventricle has no regional  wall motion abnormalities. There is mild concentric left ventricular  hypertrophy. Left ventricular diastolic  function could not be evaluated.   2. Right ventricular systolic function is normal. The right ventricular  size is normal. There is mildly elevated pulmonary artery systolic  pressure. The estimated right ventricular systolic pressure is 29.9 mmHg.   3. Left atrial size was severely dilated.   4. Right atrial size was mildly dilated.   5. The mitral valve is degenerative. Severe mitral valve regurgitation.  Mild mitral stenosis. The mean mitral valve gradient is 4.2 mmHg with  average heart rate of 103 bpm. Moderate mitral annular calcification.   6. The aortic valve is tricuspid. There is moderate calcification of the  aortic valve. There is moderate thickening of the aortic valve. Aortic  valve regurgitation is mild to moderate. Mild aortic valve stenosis.   Comparison(s): No significant change from prior study. Prior images   reviewed side by side.   Echo 10/26/2021:  1. Left ventricular ejection fraction, by estimation, is 50 to 55%. The  left ventricle has normal function. The left ventricle has no regional  wall motion abnormalities. There is mild left ventricular hypertrophy.  Left ventricular diastolic parameters  are indeterminate.   2. Right ventricular systolic function is normal. The right ventricular  size is normal. There is moderately elevated pulmonary artery systolic  pressure.   3. Left atrial size was moderately dilated.   4. Posterior leaflet appears proplapsed. MR is moderate to severe, the  jet is eccentric and may underestiamte the severity.   5. Aortic valve is moderately calcified. The aortic valve is normal in  structure. Aortic valve regurgitation is moderate. No aortic stenosis is  present.   6. The inferior vena cava is dilated in size with >50% respiratory  variability, suggesting right atrial pressure of 8 mmHg.   Comparison(s): No prior Echocardiogram.   Conclusion(s)/Recommendation(s): Patient has moderate to severe mitral  regurgitation; consider TEE if clinically indicated.   Monitor 01/21/2018: Atrial fib Rare 2 - 3 second pauses.  EKG:  EKG is personally reviewed.   09/07/2022: atypical atrial flutter at 123 bpm 08/14/2022:  atrial fibrillation at 114 bpm 02/13/2022: atrial fibrillation at 82 bpm 01/03/2022: EKG was not ordered. 11/07/2021: atrial fibrillation at 66 bpm  Recent Labs: 10/25/2021: TSH 2.517 08/22/2022: B Natriuretic Peptide 402.7 08/25/2022: Magnesium 2.0 08/31/2022: ALT 15; BUN 27; Creatinine, Ser 1.22; Hemoglobin 14.6; Platelets 178.0; Potassium 4.7; Sodium 136   Recent Lipid Panel    Component Value Date/Time   CHOL 108 08/10/2022 1344   TRIG 61.0 08/10/2022 1344   HDL 47.60 08/10/2022 1344   CHOLHDL 2 08/10/2022 1344   VLDL 12.2 08/10/2022 1344   LDLCALC 48 08/10/2022 1344   LDLCALC 36 02/10/2022 1401    Physical Exam:    VS:  BP 122/76 (BP  Location: Right Arm, Patient Position: Sitting, Cuff Size: Normal)   Pulse (!) 123   Ht '5\' 7"'$  (1.702 m)   Wt 147 lb 8 oz (66.9 kg)   BMI 23.10 kg/m     Wt Readings from Last 3 Encounters:  09/07/22 147 lb 8 oz (66.9 kg)  08/31/22 145 lb 12.8 oz (66.1 kg)  08/25/22 141 lb 1.5 oz (64 kg)    GEN: Well nourished, well developed in no acute distress HEENT: Normal, moist mucous membranes NECK: No JVD CARDIAC: irregularly irregular rhythm, normal S1 and S2, no rubs or  gallops. 1/6 systolic murmur. VASCULAR: Radial and DP pulses 2+ bilaterally. No carotid bruits RESPIRATORY:  Clear to auscultation without rales, wheezing or rhonchi  ABDOMEN: Soft, non-tender, non-distended MUSCULOSKELETAL:  Ambulates independently SKIN: Warm and dry, trace LE edema bilaterally with compression stockings in place NEUROLOGIC:  Alert and oriented x 3. No focal neuro deficits noted. PSYCHIATRIC:  Normal affect    ASSESSMENT:    1. Permanent atrial fibrillation ([place])   2. Medication management   3. Chronic diastolic CHF (congestive heart failure) ([place])   4. Secondary hypercoagulable state ([place])   5. Nonrheumatic mitral valve regurgitation   6. Long term current use of anticoagulant     PLAN:    Chronic diastolic heart failure Moderate to severe mitral regurgitation when volume overloaded -NYHA class 3 on admission, now NYHA class I-II -echo with low normal EF, moderate to severe MR at peak congestion -murmur much quieter when euvolemic -continue metoprolol succinate 12.5 mg daily, avoid bradycardia but also avoid tachycardia with afib -as noted above, he is very concerned about reaccumulating fluid. Discussed PRN torsemide for weight of 142 lbs vs. standing doses. He would prefer standing doses over PRN. After discussion, recommend he take torsemide 20 mg twice a week (decided on Tuesday/Friday). -close follow up with log of weights at next visit, will need BMET rechecked at that time -overall he has  not fluctuated more than a few lbs, but he becomes symptomatic with even mild increases in weights -if symptoms become difficult to manage, would consider TEE to determine if mitraclip would be beneficial. He prefers to avoid procedures based on prior conversations, but if otherwise we cannot manage his symptoms, would re-discuss   Atrial fibrillation, now permanent Atypical atrial flutter -CHA2DS2/VAS Stroke Risk Points=4  -continue apixaban 5 mg BID. Meets age criteria, monitor renal function. Weight is >60 kg -did not hold rhythm with cardioversion or amiodarone followed by cardioversion. Asymptomatic. No further plans for rhythm control -we discussed that activity, etc will raise HR, as evidenced today. It does decrease with rest. We discussed that it is a balance between optimizing heart rate and preventing hypotension. Discussed options, we will continue current dose of metoprolol -initial HR 123 on ECG. Improved to 90s on exam, was more irregular/consistent with afib than flutter on exam    Hyperlipidemia -continue simvastatin, reasonable for age -on fenofibrate as well  Cardiac risk counseling and prevention recommendations: -recommend heart healthy/Mediterranean diet, with whole grains, fruits, vegetable, fish, lean meats, nuts, and olive oil. Limit salt. -recommend moderate walking, 3-5 times/week for 30-50 minutes each session. Aim for at least 150 minutes.week. Goal should be pace of 3 miles/hours, or walking 1.5 miles in 30 minutes -recommend avoidance of tobacco products. Avoid excess alcohol. -ASCVD risk score: The ASCVD Risk score (Arnett DK, et al., 2019) failed to calculate for the following reasons:   The 2019 ASCVD risk score is only valid for ages 60 to 2    Plan for follow up: 3 weeks with me or APP  Medication Adjustments/Labs and Tests Ordered: Current medicines are reviewed at length with the patient today.  Concerns regarding medicines are outlined above.    Orders Placed This Encounter  Procedures   Basic metabolic panel   EKG 67-ELFY   No orders of the defined types were placed in this encounter.  Patient Instructions  Medication Instructions:  For the next few weeks, try taking torsemide 20 mg on Tuesdays and Fridays. Take potassium when you take the torsemide. We will review  the log of your weights and how you feel with this, and we will also recheck your labs when you come back.  *If you need a refill on your cardiac medications before your next appointment, please call your pharmacy*   Lab Work: Your provider has recommended lab work in 3 weeks (BMP). Please have this collected at [facility] [place]. The lab is open 8:00 am - 4:30 pm. Please avoid 12:00p - 1:00p for lunch hour. You do not need an appointment. Please go to [address] [place] [facility], [place] [address]. This is in the Primary Care office on the 3rd floor, let them know you are there for blood work and they will direct you to the lab.   If you have labs (blood work) drawn today and your tests are completely normal, you will receive your results only by: [place] (if you have MyChart) OR A paper copy in the mail If you have any lab test that is abnormal or we need to change your treatment, we will call you to review the results.   Testing/Procedures: None ordered today   Follow-Up: At [facility], you and your health needs are our priority.  As part of our continuing mission to provide you with exceptional heart care, we have created designated Provider Care Teams.  These Care Teams include your primary Cardiologist (physician) and Advanced Practice Providers (APPs -  Physician Assistants and Nurse Practitioners) who all work together to provide you with the care you need, when you need it.  We recommend signing up for the patient portal called "MyChart".  Sign up information is provided on this After Visit Summary.  MyChart  is used to connect with patients for Virtual Visits (Telemedicine).  Patients are able to view lab/test results, encounter notes, upcoming appointments, etc.  Non-urgent messages can be sent to your provider as well.   To learn more about what you can do with MyChart, go to [email or web address].    Your next appointment:   3 week(s)  The format for your next appointment:   In Person  Provider:   Buford Dresser, MD or Owens Shark, NP       Burnett Kanaris Ford,acting as a scribe for Buford Dresser, MD.,have documented all relevant documentation on the behalf of Buford Dresser, MD,as directed by  Buford Dresser, MD while in the presence of Buford Dresser, MD.   I, Buford Dresser, MD, have reviewed all documentation for this visit. The documentation on 09/07/22 for the exam, diagnosis, procedures, and orders are all accurate and complete.    Signed, Buford Dresser, MD PhD 09/07/2022     [place]

## 2022-09-08 ENCOUNTER — Encounter (HOSPITAL_BASED_OUTPATIENT_CLINIC_OR_DEPARTMENT_OTHER): Payer: Self-pay | Admitting: Cardiology

## 2022-09-13 DIAGNOSIS — Z79899 Other long term (current) drug therapy: Secondary | ICD-10-CM | POA: Diagnosis not present

## 2022-09-13 LAB — BASIC METABOLIC PANEL
BUN/Creatinine Ratio: 14 (ref 10–24)
BUN: 20 mg/dL (ref 10–36)
CO2: 22 mmol/L (ref 20–29)
Calcium: 9 mg/dL (ref 8.6–10.2)
Chloride: 99 mmol/L (ref 96–106)
Creatinine, Ser: 1.39 mg/dL — ABNORMAL HIGH (ref 0.76–1.27)
Glucose: 112 mg/dL — ABNORMAL HIGH (ref 70–99)
Potassium: 4.8 mmol/L (ref 3.5–5.2)
Sodium: 139 mmol/L (ref 134–144)
eGFR: 48 mL/min/{1.73_m2} — ABNORMAL LOW (ref >59)

## 2022-09-22 DIAGNOSIS — H3581 Retinal edema: Secondary | ICD-10-CM | POA: Diagnosis not present

## 2022-09-22 DIAGNOSIS — H353221 Exudative age-related macular degeneration, left eye, with active choroidal neovascularization: Secondary | ICD-10-CM | POA: Diagnosis not present

## 2022-09-26 ENCOUNTER — Ambulatory Visit: Payer: Self-pay | Admitting: Licensed Clinical Social Worker

## 2022-09-26 NOTE — Patient Instructions (Signed)
Visit Information  Thank you for taking time to visit with me today. Please don't hesitate to contact me if I can be of assistance to you before our next scheduled telephone appointment.  Following are the goals we discussed today:   Our next appointment is by telephone on 10/31/22 at 3:00 PM   Please call the care guide team at [phone] if you need to cancel or reschedule your appointment.   If you are experiencing a Mental Health or [place] or need someone to talk to, please go to [facility] [phone])   Following is a copy of your full plan of care:   Care Coordination Interventions:  Discussed Care Coordination program support Reviewed social interactions with client. He has numerous friends with whom he enjoys socializing.  Reviewed ambulation needs of client . He walks without device but has to take periodic rest breaks  Reviewed medication procurement of client Reviewed sleeping issues of client.  He said he sleeping very well Discussed edema issues . He said he is taking medication as prescribed for edema. Discussed support from friends. He has support from friend , [name] Reviewed transport needs. Encouraged Mark to call LCSW at [phone] as needed for SW support  [name] was given information about Care Management services by the embedded care coordination team including:  Care Management services include personalized support from designated clinical staff supervised by his physician, including individualized plan of care and coordination with other care providers 24/7 contact phone numbers for assistance for urgent and routine care needs. The patient may stop CCM services at any time (effective at the end of the month) by phone call to the office staff.  Patient agreed to services and verbal consent obtained.   [name].Mark Stark MSW, [place] Psychologist, forensic [facility] Care Management [phone]

## 2022-09-26 NOTE — Patient Outreach (Signed)
  Care Coordination   Follow Up Visit Note   09/26/2022 Name: Finnbar Cedillos. MRN: [id number] DOB: [birth year]  [name] is a 88 y.o. year old male who sees [name], [name], DO for primary care. I spoke with  [name] by phone today.  What matters to the patients health and wellness today? Client wants to maintain daily activities, complete ADLs, visit with friends, attend medical appointments, and to remain as independent as possible.     Goals Addressed             This Visit's Progress    patient wants to maintain daily activities, complete ADLs, visit with friends, work in his garden, drive to appointments and remain as independent as possible       Care Coordination Interventions:  Discussed Care Coordination program support Reviewed social interactions with client. He has numerous friends with whom he enjoys socializing.  Reviewed ambulation needs of client . He walks without device but has to take periodic rest breaks  Reviewed medication procurement of client Reviewed sleeping issues of client.  He said he sleeping very well Discussed edema issues . He said he is taking medication as prescribed for edema. Discussed support from friends. He has support from friend , [name] Reviewed transport needs. Encouraged [place] to call LCSW at [phone] as needed for SW support       SDOH assessments and interventions completed:  Yes  SDOH Interventions Today    Flowsheet Row Most Recent Value  SDOH Interventions   Physical Activity Interventions Other (Comments)  Stress Interventions Provide Counseling        Care Coordination Interventions Activated:  Yes  Care Coordination Interventions:  Yes, provided   Follow up plan: Follow up call scheduled for 10/31/22 at 3:00 PM    Encounter Outcome:  Pt. Visit Completed

## 2022-09-27 ENCOUNTER — Ambulatory Visit (HOSPITAL_BASED_OUTPATIENT_CLINIC_OR_DEPARTMENT_OTHER): Payer: Medicare Other | Admitting: Cardiology

## 2022-09-28 ENCOUNTER — Other Ambulatory Visit: Payer: Self-pay | Admitting: Family Medicine

## 2022-09-28 DIAGNOSIS — F411 Generalized anxiety disorder: Secondary | ICD-10-CM

## 2022-09-28 DIAGNOSIS — H43813 Vitreous degeneration, bilateral: Secondary | ICD-10-CM | POA: Diagnosis not present

## 2022-09-28 DIAGNOSIS — H35373 Puckering of macula, bilateral: Secondary | ICD-10-CM | POA: Diagnosis not present

## 2022-09-28 DIAGNOSIS — H401122 Primary open-angle glaucoma, left eye, moderate stage: Secondary | ICD-10-CM | POA: Diagnosis not present

## 2022-09-28 DIAGNOSIS — H353221 Exudative age-related macular degeneration, left eye, with active choroidal neovascularization: Secondary | ICD-10-CM | POA: Diagnosis not present

## 2022-09-28 DIAGNOSIS — Z23 Encounter for immunization: Secondary | ICD-10-CM | POA: Diagnosis not present

## 2022-09-28 DIAGNOSIS — H353211 Exudative age-related macular degeneration, right eye, with active choroidal neovascularization: Secondary | ICD-10-CM | POA: Diagnosis not present

## 2022-09-28 MED ORDER — CLORAZEPATE DIPOTASSIUM 15 MG PO TABS: ORAL_TABLET | ORAL | 1 refills | Status: DC

## 2022-09-28 NOTE — Telephone Encounter (Signed)
Requesting: Tranxene Contract: 02/10/2022 UDS: 02/10/2022 Last OV: 08/31/2022 Next OV: 02/09/2023  Last Refill: 08/10/2022, #30--1 RF Database:   Please advise

## 2022-09-28 NOTE — Telephone Encounter (Signed)
Medication: clorazepate (TRANXENE) 15 MG tablet   Has the patient contacted their pharmacy? Yes.    Preferred Pharmacy (with phone number or street name):  [id number] [id number]: [phone] Fax: [phone]

## 2022-09-28 NOTE — Telephone Encounter (Signed)
Requesting:clorazepate '15mg'$   Contract:07/28/21 UDS: 02/10/22 Last Visit: 08/31/22 Next Visit: 02/09/23 Last Refill: 08/10/22 #30 and 1RF  Please Advise

## 2022-10-18 DIAGNOSIS — H401122 Primary open-angle glaucoma, left eye, moderate stage: Secondary | ICD-10-CM | POA: Diagnosis not present

## 2022-10-23 ENCOUNTER — Ambulatory Visit (INDEPENDENT_AMBULATORY_CARE_PROVIDER_SITE_OTHER): Payer: Medicare Other | Admitting: Cardiology

## 2022-10-23 ENCOUNTER — Encounter (HOSPITAL_BASED_OUTPATIENT_CLINIC_OR_DEPARTMENT_OTHER): Payer: Self-pay | Admitting: Cardiology

## 2022-10-23 VITALS — BP 130/88 | HR 108 | Ht 67.0 in | Wt 151.0 lb

## 2022-10-23 DIAGNOSIS — I5032 Chronic diastolic (congestive) heart failure: Secondary | ICD-10-CM

## 2022-10-23 DIAGNOSIS — I4821 Permanent atrial fibrillation: Secondary | ICD-10-CM | POA: Diagnosis not present

## 2022-10-23 DIAGNOSIS — D6869 Other thrombophilia: Secondary | ICD-10-CM

## 2022-10-23 DIAGNOSIS — Z7901 Long term (current) use of anticoagulants: Secondary | ICD-10-CM

## 2022-10-23 DIAGNOSIS — I340 Nonrheumatic mitral (valve) insufficiency: Secondary | ICD-10-CM

## 2022-10-23 NOTE — Patient Instructions (Signed)
Medication Instructions:  None  *If you need a refill on your cardiac medications before your next appointment, please call your pharmacy*   Lab Work: BMET today  If you have labs (blood work) drawn today and your tests are completely normal, you will receive your results only by: [place] (if you have MyChart) OR A paper copy in the mail If you have any lab test that is abnormal or we need to change your treatment, we will call you to review the results.   Testing/Procedures: None   Follow-Up: At [facility], you and your health needs are our priority.  As part of our continuing mission to provide you with exceptional heart care, we have created designated Provider Care Teams.  These Care Teams include your primary Cardiologist (physician) and Advanced Practice Providers (APPs -  Physician Assistants and Nurse Practitioners) who all work together to provide you with the care you need, when you need it.  We recommend signing up for the patient portal called "MyChart".  Sign up information is provided on this After Visit Summary.  MyChart is used to connect with patients for Virtual Visits (Telemedicine).  Patients are able to view lab/test results, encounter notes, upcoming appointments, etc.  Non-urgent messages can be sent to your provider as well.   To learn more about what you can do with MyChart, go to [email or web address].    Your next appointment:   3 month(s)  The format for your next appointment:   In Person  Provider:   Buford Dresser, MD    Other Instructions  None

## 2022-10-23 NOTE — Progress Notes (Signed)
Cardiology Office Note:    Date:  10/23/2022   ID:  Mark Riles., DOB Nov 26, 1928, MRN [id number]  PCP:  [name], [name], DO  Cardiologist:  Buford Dresser, MD  Referring MD: [name], [name], *   CC: Follow up  History of Present Illness:    Mark Faughn. is a 88 y.o. male with a hx of permanent atrial fibrillation, chronic diastolic CHF, CKD stage 3a, hypertension, dyslipidemia, aortic insufficiency, mitral regurgitation, inferior mesenteric vein thrombosis, prostate cancer, and anxiety, who is seen for follow up.   Cardiovascular history: On presentation to the hospital 10/25/2021 he complained of worsening shortness of breath, LE edema, and orthopnea. CXR revealed small bilateral effusions and mild interstitial edema. He was admitted for congestive heart failure. He did well for approximately 9 mos, but he was admitted to the hospital on 08/22/2022 after calling EMS due to progressive generalized weakness. It was also noted he was "seeing things". He denied any chest or abdominal pain and/or discomfort. EMS noted his blood pressure was low on arrival and improved with fluid therapy. He did exhibit dysarthria on examination at the ER. Given his symptoms and history he was admitted from the ER to be treated in the hospital and undergo further testing. Both initial MRI and and additional MRI showed no evidence of CVA. Symptoms improved   At his last appointment, he reported that after being in the hospital he was feeling well at first. A few days prior to the visit, and the day of, the symptoms of CHF seem to have returned. The day before, he had trouble drawing a deep breathe and a cough. Starting 3-4 days prior, he noticed some symptoms returning. He took one dose of  torsemide when he noticed this and it improved his symptoms. He urinated a lot after taking the torsemide. In the days since then he had been feeling badly again. He felt that it may be a good idea to take the  torsemide daily.   He was still experiencing edema in his ankles but it was improved since last visit. He reported that this would never completely resolved.    His weight prior to admission was 146 lbs. Weight at discharge 140.8 lbs. Weight today 142.7 lbs. BP 110s/70s. HR 70s-100s. Took a torsemide, weight went from 142 to 139.9 lbs.  He was very concerned about reaccumulating fluid. Discussed PRN torsemide for weight of 142 lbs vs. standing doses. He would prefer standing doses over PRN. After discussion, recommended he take torsemide 20 mg twice a week (decided on Tuesday/Friday).  Today:  He says all things considered he is doing well. He is still experiencing some bilateral LE edema, but has noticed some improvement recently.  He has been tracking his heart rates at home and has been seeing numbers in the 60s and 70s up to 100s. Blood pressure has been well-controlled and weights have been stable around 143-144 lbs.   He states he is able to do activities that he was fearful to do before. Last week he mowed the lawn and did other yard work, with rest periods when necessary. He mentions that he has not been going on as many walks recently, however.   Of note, he reports that his eyes are not doing well. 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   He denies any palpitations, chest pain, or shortness of breath. No lightheadedness, headaches, syncope, orthopnea, or PND.  Past Medical History:  Diagnosis Date   Acute diastolic CHF (congestive heart failure) (HCC) 10/25/2021   Anxiety    Aortic insufficiency    Atrial fibrillation (HCC)    Chronic diastolic CHF (congestive heart failure) (HCC)    Chronic kidney disease, stage 3a (HCC)    Diverticulitis    Dyslipidemia    Hypertension    Inferior mesenteric vein thrombosis (HCC)    Mitral regurgitation    Nonrheumatic mitral valve regurgitation 10/28/2021   Paroxysmal atrial  fibrillation ([place]) 01/12/2018   Persistent atrial fibrillation ([place])    Prostate cancer [facility])     Past Surgical History:  Procedure Laterality Date   CARDIOVERSION N/A 03/01/2018   Procedure: CARDIOVERSION;  Surgeon: Troy Sine, MD;  Location: [facility];  Service: Cardiovascular;  Laterality: N/A;   CARDIOVERSION N/A 08/26/2021   Procedure: CARDIOVERSION;  Surgeon: Pixie Casino, MD;  Location: [place];  Service: Cardiovascular;  Laterality: N/A;   CARDIOVERSION N/A 12/06/2021   Procedure: CARDIOVERSION;  Surgeon: Donato Heinz, MD;  Location: [facility];  Service: Cardiovascular;  Laterality: N/A;   CATARACT EXTRACTION  01/27/2010   right   CATARACT EXTRACTION  03/04/2010   left   Prostate seed implants     PROSTATE SURGERY      Current Medications: Current Outpatient Medications on File Prior to Visit  Medication Sig   b complex vitamins tablet Take 1 tablet by mouth daily.   Calcium Carbonate-Vitamin D 600-400 MG-UNIT tablet Take 1 tablet by mouth daily.   clorazepate (TRANXENE) 15 MG tablet TAKE 1 TABLET BY MOUTH EVERYDAY AT BEDTIME   cromolyn (NASALCROM) 5.2 MG/ACT nasal spray Place 2 sprays into the nose 2 (two) times daily.   ELIQUIS 5 MG TABS tablet TAKE 1 TABLET BY MOUTH TWICE A DAY (Patient taking differently: Take 5 mg by mouth 2 (two) times daily.)   fenofibrate 160 MG tablet TAKE 1 TABLET BY MOUTH EVERY DAY (Patient taking differently: Take 160 mg by mouth daily.)   fish oil-omega-3 fatty acids 1000 MG capsule Take 1 g by mouth 3 (three) times daily.   latanoprost (XALATAN) 0.005 % ophthalmic solution Place 1 drop into the left eye at bedtime.   metoprolol succinate (TOPROL XL) 25 MG 24 hr tablet Take 0.5 tablets (12.5 mg total) by mouth daily.   Multiple Vitamins-Minerals (MULTIVITAMIN PO) Take 1 tablet by mouth daily.   Multiple Vitamins-Minerals (PRESERVISION AREDS 2) CAPS 2 po qd (Patient taking differently: Take 1 tablet by mouth in the  morning and at bedtime.)   potassium chloride (KLOR-CON) 10 MEQ tablet Take 1 tablet (10 mEq total) by mouth daily as needed (take when you take torsemide).   simvastatin (ZOCOR) 10 MG tablet TAKE 1 TABLET BY MOUTH EVERYDAY AT BEDTIME (Patient taking differently: Take 10 mg by mouth at bedtime.)   tamsulosin (FLOMAX) 0.4 MG CAPS capsule Take 0.4 mg by mouth at bedtime.   torsemide (DEMADEX) 20 MG tablet Take 1 tablet (20 mg total) by mouth daily as needed (if weight up 3 pounds overnight or 5 lbs from baseline). TAKE 1 TABLET BY MOUTH EVERY DAY (Patient taking differently: Take 20 mg by mouth daily as needed (if weight up 3 pounds overnight or 5 lbs from baseline).)   vitamin C (ASCORBIC ACID) 500 MG tablet Take 500 mg by mouth daily.   No current facility-administered medications on file prior to visit.     Allergies:   Sulfonamide derivatives   Social History   Tobacco  Use   Smoking status: Never   Smokeless tobacco: Never  Vaping Use   Vaping Use: Never used  Substance Use Topics   Alcohol use: No    Comment: Little wine occasionally    Drug use: No    Family History: family history includes Alzheimer's disease in his mother; Cancer (age of onset: 50) in his brother; Dementia in his mother; Mental illness in his mother.  ROS:   Please see the history of present illness. (+) Bilateral LE edema  All other systems are reviewed and negative.    EKGs/Labs/Other Studies Reviewed:    The following studies were reviewed today:  Echo 08/23/2022: IMPRESSIONS    1. Left ventricular ejection fraction, by estimation, is 60 to 65%. The  left ventricle has normal function. The left ventricle has no regional  wall motion abnormalities. There is mild concentric left ventricular  hypertrophy. Left ventricular diastolic  function could not be evaluated.   2. Right ventricular systolic function is normal. The right ventricular  size is normal. There is mildly elevated pulmonary artery  systolic  pressure. The estimated right ventricular systolic pressure is 25.9 mmHg.   3. Left atrial size was severely dilated.   4. Right atrial size was mildly dilated.   5. The mitral valve is degenerative. Severe mitral valve regurgitation.  Mild mitral stenosis. The mean mitral valve gradient is 4.2 mmHg with  average heart rate of 103 bpm. Moderate mitral annular calcification.   6. The aortic valve is tricuspid. There is moderate calcification of the  aortic valve. There is moderate thickening of the aortic valve. Aortic  valve regurgitation is mild to moderate. Mild aortic valve stenosis.   Comparison(s): No significant change from prior study. Prior images  reviewed side by side.   Echo 10/26/2021:  1. Left ventricular ejection fraction, by estimation, is 50 to 55%. The  left ventricle has normal function. The left ventricle has no regional  wall motion abnormalities. There is mild left ventricular hypertrophy.  Left ventricular diastolic parameters  are indeterminate.   2. Right ventricular systolic function is normal. The right ventricular  size is normal. There is moderately elevated pulmonary artery systolic  pressure.   3. Left atrial size was moderately dilated.   4. Posterior leaflet appears proplapsed. MR is moderate to severe, the  jet is eccentric and may underestiamte the severity.   5. Aortic valve is moderately calcified. The aortic valve is normal in  structure. Aortic valve regurgitation is moderate. No aortic stenosis is  present.   6. The inferior vena cava is dilated in size with >50% respiratory  variability, suggesting right atrial pressure of 8 mmHg.   Comparison(s): No prior Echocardiogram.   Conclusion(s)/Recommendation(s): Patient has moderate to severe mitral  regurgitation; consider TEE if clinically indicated.   Monitor 01/21/2018: Atrial fib Rare 2 - 3 second pauses.  EKG:  EKG is personally reviewed.  10/23/22: atrial fibrillation at 108  bpm  09/07/2022: atypical atrial flutter at 123 bpm 08/14/2022:  atrial fibrillation at 114 bpm 02/13/2022: atrial fibrillation at 82 bpm 01/03/2022: EKG was not ordered. 11/07/2021: atrial fibrillation at 66 bpm  Recent Labs: 10/25/2021: TSH 2.517 08/22/2022: B Natriuretic Peptide 402.7 08/25/2022: Magnesium 2.0 08/31/2022: ALT 15; Hemoglobin 14.6; Platelets 178.0 09/13/2022: BUN 20; Creatinine, Ser 1.39; Potassium 4.8; Sodium 139   Recent Lipid Panel    Component Value Date/Time   CHOL 108 08/10/2022 1344   TRIG 61.0 08/10/2022 1344   HDL 47.60 08/10/2022 1344  CHOLHDL 2 08/10/2022 1344   VLDL 12.2 08/10/2022 1344   LDLCALC 48 08/10/2022 1344   LDLCALC 36 02/10/2022 1401    Physical Exam:    VS:  BP 130/88   Pulse (!) 108   Ht '5\' 7"'$  (1.702 m)   Wt 151 lb (68.5 kg)   BMI 23.65 kg/m     Wt Readings from Last 3 Encounters:  10/23/22 151 lb (68.5 kg)  09/07/22 147 lb 8 oz (66.9 kg)  08/31/22 145 lb 12.8 oz (66.1 kg)    GEN: Well nourished, well developed in no acute distress HEENT: Normal, moist mucous membranes NECK: No JVD CARDIAC: irregularly irregular rhythm, normal S1 and S2, no rubs or gallops. 1/6 systolic murmur. VASCULAR: Radial and DP pulses 2+ bilaterally. No carotid bruits RESPIRATORY:  Clear to auscultation without rales, wheezing or rhonchi  ABDOMEN: Soft, non-tender, non-distended MUSCULOSKELETAL:  Ambulates independently SKIN: Warm and dry, trace to 1+ LE edema bilaterally NEUROLOGIC:  Alert and oriented x 3. No focal neuro deficits noted. PSYCHIATRIC:  Normal affect    ASSESSMENT:    1. Permanent atrial fibrillation ([place])   2. Secondary hypercoagulable state ([place])   3. Long term current use of anticoagulant   4. Nonrheumatic mitral valve regurgitation   5. Chronic diastolic CHF (congestive heart failure) (HCC)     PLAN:    Chronic diastolic heart failure Moderate to severe mitral regurgitation when volume overloaded -NYHA class 3 on admission, now  NYHA class I-II -echo with low normal EF, moderate to severe MR at peak congestion -murmur much quieter when euvolemic -continue metoprolol succinate 12.5 mg daily, avoid bradycardia but also avoid tachycardia with afib -doing well with torsemide 20 mg twice a week (decided on Tuesday/Friday). -overall he has not fluctuated more than a few lbs, but he becomes symptomatic with even mild increases in weights -if symptoms become difficult to manage, would consider TEE to determine if mitraclip would be beneficial. He prefers to avoid procedures based on prior conversations, but if otherwise we cannot manage his symptoms, would re-discuss -bmet today to monitor Cr/K with current diuretic    Atrial fibrillation, now permanent Atypical atrial flutter -CHA2DS2/VAS Stroke Risk Points=4  -continue apixaban 5 mg BID. Meets age criteria, monitor renal function. Weight is >60 kg -did not hold rhythm with cardioversion or amiodarone followed by cardioversion. Asymptomatic. No further plans for rhythm control -we discussed that activity, etc will raise HR, as evidenced today. It does decrease with rest. We discussed that it is a balance between optimizing heart rate and preventing hypotension. Discussed options, we will continue current dose of metoprolol    Hyperlipidemia -continue simvastatin, reasonable for age -on fenofibrate as well  Cardiac risk counseling and prevention recommendations: -recommend heart healthy/Mediterranean diet, with whole grains, fruits, vegetable, fish, lean meats, nuts, and olive oil. Limit salt. -recommend moderate walking, 3-5 times/week for 30-50 minutes each session. Aim for at least 150 minutes.week. Goal should be pace of 3 miles/hours, or walking 1.5 miles in 30 minutes -recommend avoidance of tobacco products. Avoid excess alcohol. -ASCVD risk score: The ASCVD Risk score (Arnett DK, et al., 2019) failed to calculate for the following reasons:   The 2019 ASCVD risk  score is only valid for ages 67 to 51    Plan for follow up: 3 months.  Medication Adjustments/Labs and Tests Ordered: Current medicines are reviewed at length with the patient today.  Concerns regarding medicines are outlined above.   Orders Placed This Encounter  Procedures  Basic Metabolic Panel (BMET)   EKG 12-Lead   No orders of the defined types were placed in this encounter.  Patient Instructions  Medication Instructions:  None  *If you need a refill on your cardiac medications before your next appointment, please call your pharmacy*   Lab Work: BMET today  If you have labs (blood work) drawn today and your tests are completely normal, you will receive your results only by: [place] (if you have MyChart) OR A paper copy in the mail If you have any lab test that is abnormal or we need to change your treatment, we will call you to review the results.   Testing/Procedures: None   Follow-Up: At [facility], you and your health needs are our priority.  As part of our continuing mission to provide you with exceptional heart care, we have created designated Provider Care Teams.  These Care Teams include your primary Cardiologist (physician) and Advanced Practice Providers (APPs -  Physician Assistants and Nurse Practitioners) who all work together to provide you with the care you need, when you need it.  We recommend signing up for the patient portal called "MyChart".  Sign up information is provided on this After Visit Summary.  MyChart is used to connect with patients for Virtual Visits (Telemedicine).  Patients are able to view lab/test results, encounter notes, upcoming appointments, etc.  Non-urgent messages can be sent to your provider as well.   To learn more about what you can do with MyChart, go to [email or web address].    Your next appointment:   3 month(s)  The format for your next appointment:   In Person  Provider:   Buford Dresser, MD    Other Instructions  None       I,Breanna Adamick,acting as a scribe for Buford Dresser, MD.,have documented all relevant documentation on the behalf of Buford Dresser, MD,as directed by  Buford Dresser, MD while in the presence of Buford Dresser, MD.   I, Buford Dresser, MD, have reviewed all documentation for this visit. The documentation on 10/23/22 for the exam, diagnosis, procedures, and orders are all accurate and complete.    Signed, Buford Dresser, MD PhD 10/23/2022     [facility]

## 2022-10-24 LAB — BASIC METABOLIC PANEL
BUN/Creatinine Ratio: 17 (ref 10–24)
BUN: 22 mg/dL (ref 10–36)
CO2: 27 mmol/L (ref 20–29)
Calcium: 9.7 mg/dL (ref 8.6–10.2)
Chloride: 107 mmol/L — ABNORMAL HIGH (ref 96–106)
Creatinine, Ser: 1.33 mg/dL — ABNORMAL HIGH (ref 0.76–1.27)
Glucose: 84 mg/dL (ref 70–99)
Potassium: 5.1 mmol/L (ref 3.5–5.2)
Sodium: 146 mmol/L — ABNORMAL HIGH (ref 134–144)
eGFR: 50 mL/min/{1.73_m2} — ABNORMAL LOW (ref >59)

## 2022-10-26 DIAGNOSIS — H43813 Vitreous degeneration, bilateral: Secondary | ICD-10-CM | POA: Diagnosis not present

## 2022-10-26 DIAGNOSIS — H35373 Puckering of macula, bilateral: Secondary | ICD-10-CM | POA: Diagnosis not present

## 2022-10-26 DIAGNOSIS — H353231 Exudative age-related macular degeneration, bilateral, with active choroidal neovascularization: Secondary | ICD-10-CM | POA: Diagnosis not present

## 2022-10-26 DIAGNOSIS — H401122 Primary open-angle glaucoma, left eye, moderate stage: Secondary | ICD-10-CM | POA: Diagnosis not present

## 2022-10-30 ENCOUNTER — Telehealth (HOSPITAL_BASED_OUTPATIENT_CLINIC_OR_DEPARTMENT_OTHER): Payer: Self-pay

## 2022-10-30 NOTE — Telephone Encounter (Addendum)
Results called to patient who verbalizes understanding!      ----- Message from Loel Dubonnet, NP sent at 10/30/2022  8:49 AM EST ----- Kidney function improved from previous. Normal potassium. Sodium mildly elevated, not of concern. Continue current medications.

## 2022-10-31 ENCOUNTER — Ambulatory Visit: Payer: Self-pay | Admitting: Licensed Clinical Social Worker

## 2022-10-31 NOTE — Patient Outreach (Signed)
  Care Coordination   Follow Up Visit Note   10/31/2022 Name: Mark Stark. MRN: [id number] DOB: 06-Jul-1928  Mark Riles. is a 88 y.o. year old male who sees [name], [name], DO for primary care. I spoke with  Mark Riles. by phone today.  What matters to the patients health and wellness today?  Client wants to complete daily ADLs as able. Client wants to socialize with friends as he is able   Goals Addressed               This Visit's Progress     Patient wants to continue to complete daily ADLs as able.  Client wants to socialize with friends as he is able. (pt-stated)        Care Coordination Interventions:   Informed client of Care Coordination program support. Reviewed with client role of LCSW, RN and pharmacist with program. Client and LCSW spoke of client needs. He said he drives his car for short trips as needed. He completes ADLs as needed. He has support from friend , [name].  Client spoke of hearing challenges. He spoke of vision care treatment. He spoke of macular degeneration and treatment care for his eyes Client and LCSW spoke of care client receives from cardiologist.   Client enjoys socializing with friends. He spoke of relaxation activities.   Client and LCSW spoke of client support with PCP Provided counseling support for client     SDOH assessments and interventions completed:  Yes     Care Coordination Interventions Activated:  Yes  Care Coordination Interventions:  Yes, provided   Follow up plan: Follow up call scheduled for 11/22/22 at 10:00 AM     Encounter Outcome:  Pt. Visit Completed   [name].Kamirah Shugrue MSW, [place] Holiday representative [facility] Care Management [phone]

## 2022-10-31 NOTE — Patient Instructions (Signed)
Visit Information  Thank you for taking time to visit with me today. Please don't hesitate to contact me if I can be of assistance to you before our next scheduled telephone appointment.  Following are the goals we discussed today:   Our next appointment is by telephone on 11/22/22 at 10:00 AM   Please call the care guide team at [phone] if you need to cancel or reschedule your appointment.   If you are experiencing a Mental Health or [place] or need someone to talk to, please go to [facility] [place] [phone])   Following is a copy of your full plan of care:   Care Coordination Interventions:  Informed client of Care Coordination program support. Reviewed with client role of LCSW, RN and pharmacist with program. Client and LCSW spoke of client needs. He said he drives his car for short trips as needed. He completes ADLs as needed. He has support from friend , [name].  Client spoke of hearing challenges. He spoke of vision care treatment. He spoke of macular degeneration and treatment care for his eyes Client and LCSW spoke of care client receives from cardiologist.   Client enjoys socializing with friends. He spoke of relaxation activities.   Client and LCSW spoke of client support with PCP Provided counseling support for client  Mark Stark was given information about Care Management services by the embedded care coordination team including:  Care Management services include personalized support from designated clinical staff supervised by his physician, including individualized plan of care and coordination with other care providers 24/7 contact phone numbers for assistance for urgent and routine care needs. The patient may stop CCM services at any time (effective at the end of the month) by phone call to the office staff.  Patient agreed to services and verbal consent obtained.   [name] MSW, [place] Holiday representative [facility] Care Management [phone]

## 2022-11-09 ENCOUNTER — Other Ambulatory Visit: Payer: Self-pay | Admitting: Family Medicine

## 2022-11-15 ENCOUNTER — Other Ambulatory Visit: Payer: Self-pay | Admitting: Family Medicine

## 2022-11-15 DIAGNOSIS — I480 Paroxysmal atrial fibrillation: Secondary | ICD-10-CM

## 2022-11-22 ENCOUNTER — Ambulatory Visit: Payer: Self-pay | Admitting: Licensed Clinical Social Worker

## 2022-11-22 NOTE — Patient Outreach (Signed)
  Care Coordination   Follow Up Visit Note   11/22/2022 Name: Mark Stark. MRN: [id number] DOB: 01/15/1928  Mark Stark. is a 88 y.o. year old male who sees [name], [name], DO for primary care. I spoke with  Mark Stark. by phone today.  What matters to the patients health and wellness today?  Wants to continue to complete daily ADLs as able. Wants to socialize with friends as he is able.     Goals Addressed               This Visit's Progress     Patient wants to continue to complete daily ADLs as able.  Client wants to socialize with friends as he is able. (pt-stated)        Care Coordination Interventions:   Informed client of Care Coordination program support. Reviewed with client role of LCSW, RN and pharmacist with program. Client and LCSW spoke of client needs. He said he drives his car for short trips as needed. He completes ADLs as needed. He has support from friend , Mark Stark.  Client and LCSW spoke of care client receives from cardiologist.   Client enjoys socializing with friends.    Client and LCSW spoke of client support with PCP Provided counseling support for client Talked of walking, exercise of client. He works in his yard as he is able and likes caring for his yard and caring for his garden. Spoke of eye issues. He said he has appointment with an ophthalmologist next week. Discussed support from his friend Mark Stark. Reviewed sleeping needs. He said he sleeps well. Reviewed medication procurement. Reviewed meal preparation of client. Encouraged client to call LCSW as needed for SW support at [phone]       SDOH assessments and interventions completed:  Yes     Care Coordination Interventions:  Yes, provided   Follow up plan: Follow up call scheduled for 12/26/21 at 3:00 PM    Encounter Outcome:  Pt. Visit Completed   [name].Dareth Andrew MSW, [place] Holiday representative [facility] Care Management [phone]

## 2022-11-22 NOTE — Patient Instructions (Signed)
Visit Information  Thank you for taking time to visit with me today. Please don't hesitate to contact me if I can be of assistance to you before our next scheduled telephone appointment.  Following are the goals we discussed today:   Our next appointment is by telephone on 12/26/22 at 3:00 PM   Please call the care guide team at [phone] if you need to cancel or reschedule your appointment.   If you are experiencing a Mental Health or [place] or need someone to talk to, please go to [facility] [place] [phone])   Following is a copy of your full plan of care:   Care Coordination Interventions:   Informed client of Care Coordination program support. Reviewed with client role of LCSW, RN and pharmacist with program. Client and LCSW spoke of client needs. He said he drives his car for short trips as needed. He completes ADLs as needed. He has support from friend , [name].  Client and LCSW spoke of care client receives from cardiologist.   Client enjoys socializing with friends.    Client and LCSW spoke of client support with PCP Provided counseling support for client Talked of walking, exercise of client. He works in his yard as he is able and likes caring for his yard and caring for his garden. Spoke of eye issues. He said he has appointment with an ophthalmologist next week. Discussed support from his friend [name]. Reviewed sleeping needs. He said he sleeps well. Reviewed medication procurement. Reviewed meal preparation of client. Encouraged client to call LCSW as needed for SW support at [phone]  Mark Stark was given information about Care Management services by the embedded care coordination team including:  Care Management services include personalized support from designated clinical staff supervised by his physician, including individualized plan of care and coordination with  other care providers 24/7 contact phone numbers for assistance for urgent and routine care needs. The patient may stop CCM services at any time (effective at the end of the month) by phone call to the office staff.  Patient agreed to services and verbal consent obtained.    [name].Shallen Luedke MSW, [place] representative [facility] Care Management [phone]

## 2022-11-24 ENCOUNTER — Emergency Department (HOSPITAL_BASED_OUTPATIENT_CLINIC_OR_DEPARTMENT_OTHER): Payer: Medicare Other

## 2022-11-24 ENCOUNTER — Other Ambulatory Visit: Payer: Self-pay

## 2022-11-24 ENCOUNTER — Emergency Department (HOSPITAL_BASED_OUTPATIENT_CLINIC_OR_DEPARTMENT_OTHER)
Admission: EM | Admit: 2022-11-24 | Discharge: 2022-11-24 | Disposition: A | Discharge status: Home / Self Care | Payer: Medicare Other | Attending: Student | Admitting: Student

## 2022-11-24 ENCOUNTER — Encounter (HOSPITAL_BASED_OUTPATIENT_CLINIC_OR_DEPARTMENT_OTHER): Payer: Self-pay | Admitting: Emergency Medicine

## 2022-11-24 DIAGNOSIS — I5033 Acute on chronic diastolic (congestive) heart failure: Secondary | ICD-10-CM | POA: Diagnosis not present

## 2022-11-24 DIAGNOSIS — S199XXA Unspecified injury of neck, initial encounter: Secondary | ICD-10-CM | POA: Diagnosis not present

## 2022-11-24 DIAGNOSIS — S0990XA Unspecified injury of head, initial encounter: Secondary | ICD-10-CM | POA: Diagnosis not present

## 2022-11-24 DIAGNOSIS — S8012XA Contusion of left lower leg, initial encounter: Secondary | ICD-10-CM | POA: Diagnosis not present

## 2022-11-24 DIAGNOSIS — S300XXA Contusion of lower back and pelvis, initial encounter: Secondary | ICD-10-CM | POA: Insufficient documentation

## 2022-11-24 DIAGNOSIS — N1831 Chronic kidney disease, stage 3a: Secondary | ICD-10-CM | POA: Insufficient documentation

## 2022-11-24 DIAGNOSIS — M545 Low back pain, unspecified: Secondary | ICD-10-CM | POA: Diagnosis not present

## 2022-11-24 DIAGNOSIS — Z7901 Long term (current) use of anticoagulants: Secondary | ICD-10-CM | POA: Insufficient documentation

## 2022-11-24 DIAGNOSIS — S3991XA Unspecified injury of abdomen, initial encounter: Secondary | ICD-10-CM | POA: Diagnosis not present

## 2022-11-24 DIAGNOSIS — M48061 Spinal stenosis, lumbar region without neurogenic claudication: Secondary | ICD-10-CM | POA: Diagnosis not present

## 2022-11-24 DIAGNOSIS — W19XXXA Unspecified fall, initial encounter: Secondary | ICD-10-CM | POA: Insufficient documentation

## 2022-11-24 DIAGNOSIS — M47812 Spondylosis without myelopathy or radiculopathy, cervical region: Secondary | ICD-10-CM | POA: Diagnosis not present

## 2022-11-24 DIAGNOSIS — Z8546 Personal history of malignant neoplasm of prostate: Secondary | ICD-10-CM | POA: Insufficient documentation

## 2022-11-24 DIAGNOSIS — S3992XA Unspecified injury of lower back, initial encounter: Secondary | ICD-10-CM | POA: Diagnosis not present

## 2022-11-24 DIAGNOSIS — Z79899 Other long term (current) drug therapy: Secondary | ICD-10-CM | POA: Diagnosis not present

## 2022-11-24 DIAGNOSIS — M4126 Other idiopathic scoliosis, lumbar region: Secondary | ICD-10-CM | POA: Diagnosis not present

## 2022-11-24 DIAGNOSIS — M1612 Unilateral primary osteoarthritis, left hip: Secondary | ICD-10-CM | POA: Diagnosis not present

## 2022-11-24 DIAGNOSIS — I2510 Atherosclerotic heart disease of native coronary artery without angina pectoris: Secondary | ICD-10-CM | POA: Diagnosis not present

## 2022-11-24 DIAGNOSIS — T148XXA Other injury of unspecified body region, initial encounter: Secondary | ICD-10-CM

## 2022-11-24 DIAGNOSIS — G319 Degenerative disease of nervous system, unspecified: Secondary | ICD-10-CM | POA: Diagnosis not present

## 2022-11-24 DIAGNOSIS — I6523 Occlusion and stenosis of bilateral carotid arteries: Secondary | ICD-10-CM | POA: Diagnosis not present

## 2022-11-24 DIAGNOSIS — K5730 Diverticulosis of large intestine without perforation or abscess without bleeding: Secondary | ICD-10-CM | POA: Diagnosis not present

## 2022-11-24 DIAGNOSIS — I130 Hypertensive heart and chronic kidney disease with heart failure and stage 1 through stage 4 chronic kidney disease, or unspecified chronic kidney disease: Secondary | ICD-10-CM | POA: Insufficient documentation

## 2022-11-24 DIAGNOSIS — M4316 Spondylolisthesis, lumbar region: Secondary | ICD-10-CM | POA: Diagnosis not present

## 2022-11-24 DIAGNOSIS — Z043 Encounter for examination and observation following other accident: Secondary | ICD-10-CM | POA: Diagnosis not present

## 2022-11-24 DIAGNOSIS — M47816 Spondylosis without myelopathy or radiculopathy, lumbar region: Secondary | ICD-10-CM | POA: Diagnosis not present

## 2022-11-24 LAB — COMPREHENSIVE METABOLIC PANEL
ALT: 21 U/L (ref 0–44)
AST: 34 U/L (ref 15–41)
Albumin: 3.3 g/dL — ABNORMAL LOW (ref 3.5–5.0)
Alkaline Phosphatase: 47 U/L (ref 38–126)
Anion gap: 8 (ref 5–15)
BUN: 24 mg/dL — ABNORMAL HIGH (ref 8–23)
CO2: 26 mmol/L (ref 22–32)
Calcium: 8.9 mg/dL (ref 8.9–10.3)
Chloride: 98 mmol/L (ref 98–111)
Creatinine, Ser: 1.33 mg/dL — ABNORMAL HIGH (ref 0.61–1.24)
GFR, Estimated: 50 mL/min — ABNORMAL LOW (ref >60)
Glucose, Bld: 101 mg/dL — ABNORMAL HIGH (ref 70–99)
Potassium: 4.2 mmol/L (ref 3.5–5.1)
Sodium: 132 mmol/L — ABNORMAL LOW (ref 135–145)
Total Bilirubin: 1 mg/dL (ref 0.3–1.2)
Total Protein: 6.3 g/dL — ABNORMAL LOW (ref 6.5–8.1)

## 2022-11-24 LAB — URINALYSIS, ROUTINE W REFLEX MICROSCOPIC
Bilirubin Urine: NEGATIVE
Glucose, UA: NEGATIVE mg/dL
Hgb urine dipstick: NEGATIVE
Ketones, ur: NEGATIVE mg/dL
Leukocytes,Ua: NEGATIVE
Nitrite: NEGATIVE
Protein, ur: NEGATIVE mg/dL
Specific Gravity, Urine: 1.015 (ref 1.005–1.030)
pH: 7 (ref 5.0–8.0)

## 2022-11-24 LAB — CBC WITH DIFFERENTIAL/PLATELET
Abs Immature Granulocytes: 0.09 10*3/uL — ABNORMAL HIGH (ref 0.00–0.07)
Basophils Absolute: 0 10*3/uL (ref 0.0–0.1)
Basophils Relative: 0 %
Eosinophils Absolute: 0 10*3/uL (ref 0.0–0.5)
Eosinophils Relative: 0 %
HCT: 30.6 % — ABNORMAL LOW (ref 39.0–52.0)
Hemoglobin: 10.3 g/dL — ABNORMAL LOW (ref 13.0–17.0)
Immature Granulocytes: 1 %
Lymphocytes Relative: 10 %
Lymphs Abs: 1.2 10*3/uL (ref 0.7–4.0)
MCH: 31.2 pg (ref 26.0–34.0)
MCHC: 33.7 g/dL (ref 30.0–36.0)
MCV: 92.7 fL (ref 80.0–100.0)
Monocytes Absolute: 1.5 10*3/uL — ABNORMAL HIGH (ref 0.1–1.0)
Monocytes Relative: 13 %
Neutro Abs: 9 10*3/uL — ABNORMAL HIGH (ref 1.7–7.7)
Neutrophils Relative %: 76 %
Platelets: 180 10*3/uL (ref 150–400)
RBC: 3.3 MIL/uL — ABNORMAL LOW (ref 4.22–5.81)
RDW: 14.1 % (ref 11.5–15.5)
WBC: 11.9 10*3/uL — ABNORMAL HIGH (ref 4.0–10.5)
nRBC: 0 % (ref 0.0–0.2)

## 2022-11-24 LAB — CK: Total CK: 251 U/L (ref 49–397)

## 2022-11-24 MED ORDER — IOHEXOL 350 MG/ML SOLN
125.0000 mL | Freq: Once | INTRAVENOUS | Status: AC | PRN
  Administered 2022-11-24: 120 mL via INTRAVENOUS

## 2022-11-24 MED ORDER — FENTANYL CITRATE PF 50 MCG/ML IJ SOSY
50.0000 ug | PREFILLED_SYRINGE | Freq: Once | INTRAMUSCULAR | Status: AC
  Administered 2022-11-24: 50 ug via INTRAVENOUS
  Filled 2022-11-24: qty 1

## 2022-11-24 MED ORDER — METOPROLOL TARTRATE 5 MG/5ML IV SOLN
5.0000 mg | Freq: Once | INTRAVENOUS | Status: AC
  Administered 2022-11-24: 5 mg via INTRAVENOUS
  Filled 2022-11-24: qty 5

## 2022-11-24 NOTE — ED Triage Notes (Signed)
Reports fall 3 days ago , landed backward  on concrete , head injury , on Eliquis . Obvious bruising to posterior scalp , bilateral hip pain .  Alert and oriented x 4 .

## 2022-11-24 NOTE — ED Notes (Signed)
L foot is edematous with pedal pulse noted upon palpation.

## 2022-11-24 NOTE — ED Provider Notes (Signed)
Blood pressure 118/87, pulse (!) 131, temperature (!) 97.5 F (36.4 C), temperature source Oral, resp. rate 12, height '5\' 6"'$  (1.676 m), weight 71.7 kg, SpO2 100 %.  Assuming care from Dr. Matilde Sprang.  In short, Mark Stark. is a 88 y.o. male with a chief complaint of Fall (Eliquis) .  Refer to the original H&P for additional details.  The current plan of care is to f/u on CTA and reassess. Patient lives alone and arrives in a-fib with RVR.   05:03 PM Reevaluated patient and after CT imaging has resulted.  He tells me that he is fallen on Wednesday but he has been ambulatory since the fall.  He has been walking with a cane but getting around and doing his ADLs without difficulty.  Has been taking his home medications.  He has pain with standing or sitting but is able to manage.  We discussed his elevated heart rate but patient and family member state that he often runs mild tachycardia even with his metoprolol.  We discussed the possibility of admission for PT/OT, heart rate management, observation but he notes that the hematoma is getting smaller and his pain is manageable.  He strongly prefers to go home and follow-up with his PCP with strict ED return precaution.  I do not appreciate any point tenderness at the left lateral femoral condyle and suspect this is from remote trauma.    Margette Fast, MD 11/24/22 [phone]

## 2022-11-24 NOTE — ED Provider Notes (Signed)
[place] EMERGENCY DEPARTMENT Provider Note  CSN: [id number] Arrival date & time: 11/24/22 1041  Chief Complaint(s) Fall (Eliquis)  HPI Deshawn Witty. is a 88 y.o. male with PMH A-fib on Eliquis, CHF, HTN, HLD, prostate cancer who presents emergency department for evaluation of a fall.  Patient states that fall happened approximately 72 hours ago when he slipped and landed on his back and struck his head against the ground.  He states that he has noticed the development of a large bruise over the left glutes and left tib-fib.  He states that he has been able to get around and walk, but has had significant difficulty getting in and out of a chair secondary to pain in the glutes.  Denies numbness, tingling, weakness, nausea, vomiting, chest pain, shortness of breath, abdominal pain or other systemic, neurologic or traumatic complaints.   Past Medical History Past Medical History:  Diagnosis Date   Acute diastolic CHF (congestive heart failure) (HCC) 10/25/2021   Anxiety    Aortic insufficiency    Atrial fibrillation (HCC)    Chronic diastolic CHF (congestive heart failure) (HCC)    Chronic kidney disease, stage 3a (HCC)    Diverticulitis    Dyslipidemia    Hypertension    Inferior mesenteric vein thrombosis (HCC)    Mitral regurgitation    Nonrheumatic mitral valve regurgitation 10/28/2021   Paroxysmal atrial fibrillation ([place]) 01/12/2018   Persistent atrial fibrillation ([place])    Prostate cancer [facility])    Patient Active Problem List   Diagnosis Date Noted   Acute diastolic (congestive) heart failure ([place]) 08/24/2022   BPH (benign prostatic hyperplasia) 08/23/2022   Generalized weakness 08/22/2022   Inferior mesenteric vein thrombosis ([place]) 08/09/2022   Diverticulitis 08/09/2022   Chronic kidney disease, stage 3a ([place]) 08/09/2022   Primary open angle glaucoma (POAG) of left eye, moderate stage 06/08/2022   Contusion of left orbit 01/05/2022   Chronic diastolic CHF  (congestive heart failure) ([place]) 11/09/2021   Nonrheumatic mitral valve regurgitation 10/28/2021   Acute kidney injury superimposed on CKD ([place]) 63/78/5885   Acute diastolic CHF (congestive heart failure) ([place]) 10/25/2021   Secondary hypercoagulable state ([place]) 08/23/2021   Prostate CA ([place]) 07/28/2021   Thrombocytopenia ([place]) 03/30/2021   Screening for HIV (human immunodeficiency virus) 03/30/2021   Monocytosis 03/30/2021   Abnormal platelets ([place]) 03/28/2021   Hip pain 03/28/2021   Dry eyes, bilateral 05/27/2020   Epiretinal membrane (ERM) of both eyes 12/03/2019   Persistent atrial fibrillation (HCC)    Paroxysmal atrial fibrillation ([place]) 01/12/2018   Cystoid macular edema of right eye 11/29/2017   Macular degeneration 12/21/2015   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 eye with active choroidal neovascularization ([place]) 04/30/2014   Myalgia and myositis 11/16/2013   Epiretinal membrane 11/07/2012   Posterior vitreous detachment 11/07/2012   Status post intraocular lens implant 11/07/2012   Fungal ear infection 04/09/2012   Abnormal laboratory test 03/27/2011   Aortic insufficiency 02/77/4128   SYSTOLIC MURMUR 78/67/6720   EDEMA 12/05/2010   ECCHYMOSES, SPONTANEOUS 11/28/2010   CONTUSION, RIGHT THIGH 11/28/2010   DECREASED HEARING, BILATERAL 09/23/2010   Pain in limb 07/28/2009   ANKLE SPRAIN, LEFT 07/28/2009   CONTUSION, LOWER LEG, LEFT 07/28/2009   DERMATITIS, CONTACT, NOS 10/02/2007   Essential hypertension 05/07/2007   PROSTATE CANCER, HX OF 03/13/2007   Hyperlipidemia 01/15/2007   Anxiety state 01/15/2007   DIVERTICULITIS, HX OF 01/15/2007   Home Medication(s) Prior to Admission medications   Medication Sig Start Date  End Date Taking? Authorizing Provider  apixaban (ELIQUIS) 5 MG TABS tablet Take 1 tablet (5 mg total) by mouth 2 (two) times daily. 11/15/22   Roma Schanz R, DO  b complex vitamins tablet Take 1 tablet by mouth daily.    [provider]  Calcium Carbonate-Vitamin D 600-400 MG-UNIT tablet Take 1 tablet by mouth daily.    [provider]  clorazepate (TRANXENE) 15 MG tablet TAKE 1 TABLET BY MOUTH EVERYDAY AT BEDTIME 09/28/22   Dutch Quint B, FNP  cromolyn (NASALCROM) 5.2 MG/ACT nasal spray Place 2 sprays into the nose 2 (two) times daily.    [provider]  fenofibrate 160 MG tablet TAKE 1 TABLET BY MOUTH EVERY DAY 11/10/22   [name], [name], DO  fish oil-omega-3 fatty acids 1000 MG capsule Take 1 g by mouth 3 (three) times daily.    [provider]  latanoprost (XALATAN) 0.005 % ophthalmic solution Place 1 drop into the left eye at bedtime. 07/24/22   [provider]  metoprolol succinate (TOPROL XL) 25 MG 24 hr tablet Take 0.5 tablets (12.5 mg total) by mouth daily. 03/13/22   Buford Dresser, MD  Multiple Vitamins-Minerals (MULTIVITAMIN PO) Take 1 tablet by mouth daily.    [provider]  Multiple Vitamins-Minerals (PRESERVISION AREDS 2) CAPS 2 po qd Patient taking differently: Take 1 tablet by mouth in the morning and at bedtime. 07/05/17   [name], DO  potassium chloride (KLOR-CON) 10 MEQ tablet Take 1 tablet (10 mEq total) by mouth daily as needed (take when you take torsemide). 01/03/22   Buford Dresser, MD  simvastatin (ZOCOR) 10 MG tablet TAKE 1 TABLET BY MOUTH EVERYDAY AT BEDTIME Patient taking differently: Take 10 mg by mouth at bedtime. 07/24/22   Buford Dresser, MD  tamsulosin (FLOMAX) 0.4 MG CAPS capsule Take 0.4 mg by mouth at bedtime. 11/24/21   [provider]  torsemide (DEMADEX) 20 MG tablet Take 1 tablet (20 mg total) by mouth daily as needed (if weight up 3 pounds overnight or 5 lbs from baseline). TAKE 1 TABLET BY MOUTH EVERY DAY Patient taking differently: Take 20 mg by mouth daily as needed (if weight up 3 pounds overnight or 5 lbs from baseline). 01/03/22   Buford Dresser, MD  vitamin C  (ASCORBIC ACID) 500 MG tablet Take 500 mg by mouth daily.    [provider]                                                                                                                                    Past Surgical History Past Surgical History:  Procedure Laterality Date   CARDIOVERSION N/A 03/01/2018   Procedure: CARDIOVERSION;  Surgeon: Troy Sine, MD;  Location: [facility];  Service: Cardiovascular;  Laterality: N/A;   CARDIOVERSION N/A 08/26/2021   Procedure: CARDIOVERSION;  Surgeon: Pixie Casino, MD;  Location: [facility] ENDOSCOPY;  Service: Cardiovascular;  Laterality: N/A;   CARDIOVERSION N/A 12/06/2021   Procedure: CARDIOVERSION;  Surgeon: Donato Heinz, MD;  Location: [facility] ENDOSCOPY;  Service: Cardiovascular;  Laterality: N/A;   CATARACT EXTRACTION  01/27/2010   right   CATARACT EXTRACTION  03/04/2010   left   Prostate seed implants     PROSTATE SURGERY     Family History Family History  Problem Relation Age of Onset   Dementia Mother    Alzheimer's disease Mother    Mental illness Mother        ALZ DISEASE   Cancer Brother 53       pancreatic cancer    Social History Social History   Tobacco Use   Smoking status: Never   Smokeless tobacco: Never  Vaping Use   Vaping Use: Never used  Substance Use Topics   Alcohol use: No    Comment: Little wine occasionally    Drug use: No   Allergies Sulfonamide derivatives  Review of Systems Review of Systems  Musculoskeletal:  Positive for arthralgias and myalgias.    Physical Exam Vital Signs  I have reviewed the triage vital signs BP 118/87   Pulse (!) 131   Temp (!) 97.5 F (36.4 C) (Oral)   Resp 12   Ht '5\' 6"'$  (1.676 m)   Wt 71.7 kg   SpO2 100%   BMI 25.50 kg/m   Physical Exam Constitutional:      General: He is not in acute distress.    Appearance: Normal appearance.  HENT:     Head: Normocephalic and atraumatic.     Nose: No congestion or rhinorrhea.  Eyes:     General:         Right eye: No discharge.        Left eye: No discharge.     Extraocular Movements: Extraocular movements intact.     Pupils: Pupils are equal, round, and reactive to light.  Cardiovascular:     Rate and Rhythm: Normal rate and regular rhythm.     Heart sounds: No murmur heard. Pulmonary:     Effort: No respiratory distress.     Breath sounds: No wheezing or rales.  Abdominal:     General: There is no distension.     Tenderness: There is no abdominal tenderness.  Musculoskeletal:        General: Swelling and tenderness present. Normal range of motion.     Cervical back: Normal range of motion.     Comments: Significant bruising to left glutes, left tib-fib, decreased pulses on the left  Skin:    General: Skin is warm and dry.  Neurological:     General: No focal deficit present.     Mental Status: He is alert.     ED Results and Treatments Labs (all labs ordered are listed, but only abnormal results are displayed) Labs Reviewed  COMPREHENSIVE METABOLIC PANEL - Abnormal; Notable for the following components:      Result Value   Sodium 132 (*)    Glucose, Bld 101 (*)    BUN 24 (*)    Creatinine, Ser 1.33 (*)    Total Protein 6.3 (*)    Albumin 3.3 (*)    GFR, Estimated 50 (*)    All other components within normal limits  CBC WITH DIFFERENTIAL/PLATELET - Abnormal; Notable for the following components:   WBC 11.9 (*)    RBC 3.30 (*)    Hemoglobin 10.3 (*)    HCT 30.6 (*)  Neutro Abs 9.0 (*)    Monocytes Absolute 1.5 (*)    Abs Immature Granulocytes 0.09 (*)    All other components within normal limits  CK  URINALYSIS, ROUTINE W REFLEX MICROSCOPIC                                                                                                                          Radiology CT L-SPINE NO CHARGE  Result Date: 11/24/2022 CLINICAL DATA:  Fall on back 3 days ago.  Low back pain. EXAM: CT LUMBAR SPINE WITHOUT CONTRAST TECHNIQUE: Multidetector CT imaging of the  lumbar spine was performed without intravenous contrast administration. Multiplanar CT image reconstructions were also generated. RADIATION DOSE REDUCTION: This exam was performed according to the departmental dose-optimization program which includes automated exposure control, adjustment of the mA and/or kV according to patient size and/or use of iterative reconstruction technique. COMPARISON:  Lumbar spine radiographs 03/28/2021 FINDINGS: Segmentation: 5 non rib-bearing lumbar type vertebral bodies are present. The lowest fully formed vertebral body is L5. Alignment: Slight retrolisthesis at L1-2 and L2-3 is stable. Mild anterolisthesis at L4-5 and L5-S1 is stable. Stable curvature is centered to the left at L1-2 and to the right at L4. Vertebrae: Mild sclerotic degenerative changes are present on the right at L1-2 and to lesser extent at L2-3. Mild sclerotic endplate changes are present on the left at L3-4 and L4-5. Vertebral body heights are maintained. No focal osseous lesions are present. Paraspinal and other soft tissues: Atherosclerotic calcifications are present in the aorta and branch vessels without aneurysm. Visualized abdomen is otherwise within normal limits. No significant adenopathy is present. Disc levels: T12-L1: Chronic loss of disc height is present. No significant stenosis is present. L1-2: Chronic loss of disc height is present. No significant stenosis is present. L2-3: A leftward disc protrusion is present. Mild facet hypertrophy is noted. Mild left subarticular are in foraminal stenosis is present. L3-4: A leftward disc protrusion is present. Moderate facet hypertrophy is noted. Mild subarticular narrowing is worse on the left. Moderate bilateral foraminal stenosis is present. L4-5: A leftward disc protrusion present. Asymmetric left-sided facet hypertrophy and ligamentum flavum thickening is noted. Severe left and moderate right subarticular and foraminal stenosis is present. L5-S1: Moderate  facet hypertrophy is present bilaterally. This contributes to mild bilateral foraminal narrowing. IMPRESSION: 1. No acute abnormality. 2. Stable scoliosis of the lumbar spine. 3. Severe left and moderate right subarticular and foraminal stenosis at L4-5. 4. Mild left subarticular and foraminal stenosis at L2-3. 5. Moderate bilateral foraminal stenosis at L3-4. 6. Mild bilateral foraminal narrowing at L5-S1. 7.  Aortic Atherosclerosis (ICD10-I70.0). Electronically Signed   By: San Morelle M.D.   On: 11/24/2022 14:40   DG Hip Unilat W or Wo Pelvis 2-3 Views Left  Result Date: 11/24/2022 CLINICAL DATA:  Pain after fall EXAM: DG HIP (WITH OR WITHOUT PELVIS) 2-3V LEFT COMPARISON:  None Available. FINDINGS: Degenerative changes are identified in the bilateral hips without loss of joint  space. No fractures are identified. Specifically, no left hip fracture is noted. Degenerative changes are identified in the lower lumbar spine. No other abnormalities. IMPRESSION: Degenerative changes in the hips without loss of joint space. Degenerative changes in the lower lumbar spine. No fracture or dislocation. Electronically Signed   By: Dorise Bullion III M.D.   On: 11/24/2022 12:04   DG Chest Portable 1 View  Result Date: 11/24/2022 CLINICAL DATA:  Fall EXAM: PORTABLE CHEST 1 VIEW COMPARISON:  Chest x-ray dated August 22, 2022 FINDINGS: The heart size and mediastinal contours are within normal limits. Both lungs are clear. No evidence of pleural effusion or pneumothorax. No evidence of displaced fracture. IMPRESSION: No active disease. Electronically Signed   By: Yetta Glassman M.D.   On: 11/24/2022 12:01   CT HEAD WO CONTRAST (5MM)  Result Date: 11/24/2022 CLINICAL DATA:  Head trauma, minor (Age >= 65y); Neck trauma (Age >= 65y) EXAM: CT HEAD WITHOUT CONTRAST CT CERVICAL SPINE WITHOUT CONTRAST TECHNIQUE: Multidetector CT imaging of the head and cervical spine was performed following the standard protocol  without intravenous contrast. Multiplanar CT image reconstructions of the cervical spine were also generated. RADIATION DOSE REDUCTION: This exam was performed according to the departmental dose-optimization program which includes automated exposure control, adjustment of the mA and/or kV according to patient size and/or use of iterative reconstruction technique. COMPARISON:  08/22/2022 FINDINGS: CT HEAD FINDINGS Brain: No evidence of acute infarction, hemorrhage, hydrocephalus, extra-axial collection or mass lesion/mass effect. Scattered low-density changes within the periventricular and subcortical white matter compatible with chronic microvascular ischemic change. Mild diffuse cerebral volume loss. Vascular: Atherosclerotic calcifications involving the large vessels of the skull base. No unexpected hyperdense vessel. Skull: Normal. Negative for fracture or focal lesion. Sinuses/Orbits: No acute finding. Other: Mild soft tissue swelling over the posterior high left parietal region. CT CERVICAL SPINE FINDINGS Alignment: Facet joints are aligned without dislocation or traumatic listhesis. Dens and lateral masses are aligned. Skull base and vertebrae: No acute fracture. No primary bone lesion or focal pathologic process. Soft tissues and spinal canal: No prevertebral fluid or swelling. No visible canal hematoma. Disc levels: Mild multilevel disc height loss with endplate spurring. Facet arthropathy is most pronounced on the right at C2-3 and C3-4. Upper chest: Negative. Other: Bilateral carotid atherosclerosis. IMPRESSION: 1. No acute intracranial abnormality. 2. Mild soft tissue swelling over the posterior high left parietal region. 3. No acute fracture or subluxation of the cervical spine. Electronically Signed   By: Davina Poke D.O.   On: 11/24/2022 11:59   CT Cervical Spine Wo Contrast  Result Date: 11/24/2022 CLINICAL DATA:  Head trauma, minor (Age >= 65y); Neck trauma (Age >= 65y) EXAM: CT HEAD WITHOUT  CONTRAST CT CERVICAL SPINE WITHOUT CONTRAST TECHNIQUE: Multidetector CT imaging of the head and cervical spine was performed following the standard protocol without intravenous contrast. Multiplanar CT image reconstructions of the cervical spine were also generated. RADIATION DOSE REDUCTION: This exam was performed according to the departmental dose-optimization program which includes automated exposure control, adjustment of the mA and/or kV according to patient size and/or use of iterative reconstruction technique. COMPARISON:  08/22/2022 FINDINGS: CT HEAD FINDINGS Brain: No evidence of acute infarction, hemorrhage, hydrocephalus, extra-axial collection or mass lesion/mass effect. Scattered low-density changes within the periventricular and subcortical white matter compatible with chronic microvascular ischemic change. Mild diffuse cerebral volume loss. Vascular: Atherosclerotic calcifications involving the large vessels of the skull base. No unexpected hyperdense vessel. Skull: Normal. Negative for fracture or focal lesion.  Sinuses/Orbits: No acute finding. Other: Mild soft tissue swelling over the posterior high left parietal region. CT CERVICAL SPINE FINDINGS Alignment: Facet joints are aligned without dislocation or traumatic listhesis. Dens and lateral masses are aligned. Skull base and vertebrae: No acute fracture. No primary bone lesion or focal pathologic process. Soft tissues and spinal canal: No prevertebral fluid or swelling. No visible canal hematoma. Disc levels: Mild multilevel disc height loss with endplate spurring. Facet arthropathy is most pronounced on the right at C2-3 and C3-4. Upper chest: Negative. Other: Bilateral carotid atherosclerosis. IMPRESSION: 1. No acute intracranial abnormality. 2. Mild soft tissue swelling over the posterior high left parietal region. 3. No acute fracture or subluxation of the cervical spine. Electronically Signed   By: Davina Poke D.O.   On: 11/24/2022 11:59     Pertinent labs & imaging results that were available during my care of the patient were reviewed by me and considered in my medical decision making (see MDM for details).  Medications Ordered in ED Medications  metoprolol tartrate (LOPRESSOR) injection 5 mg (has no administration in time range)  fentaNYL (SUBLIMAZE) injection 50 mcg (50 mcg Intravenous Given 11/24/22 1235)  iohexol (OMNIPAQUE) 350 MG/ML injection 125 mL (120 mLs Intravenous Contrast Given 11/24/22 1339)                                                                                                                                     Procedures Procedures  (including critical care time)  Medical Decision Making / ED Course   This patient presents to the ED for concern of fall, leg pain, this involves an extensive number of treatment options, and is a complaint that carries with it a high risk of complications and morbidity.  The differential diagnosis includes fracture, ligamentous injury, hematoma, compartment syndrome  MDM: Patient seen the emergency room for evaluation of a fall with leg pain.  Physical exam with a large bruise over the glute on the left with an additional hematoma over the tib-fib on the left.  Initial laboratory evaluation with a BUN of 24, creatinine 1.33, leukocytosis to 11.9, hemoglobin 10.3 which is a significant drop for this patient.  Urinalysis unremarkable.  CK negative.  CT head and C-spine with no acute traumatic findings.  Chest x-ray pelvis x-ray with no acute fracture.  CT L-spine with no acute fracture but does show multilevel foraminal stenosis.  Patient does have some small decreased pulses on the left and thus a CTA aortobifem was obtained to evaluate for active extra have in the setting of a suspected gluteal hematoma.  The scan is pending at time of signout.  Please see provider assignment for continuation of workup.   Additional history obtained: -Additional history obtained from  son -External records from outside source obtained and reviewed including: Chart review including previous notes, labs, imaging, consultation notes   Lab Tests: -I ordered, reviewed, and interpreted labs.  The pertinent results include:   Labs Reviewed  COMPREHENSIVE METABOLIC PANEL - Abnormal; Notable for the following components:      Result Value   Sodium 132 (*)    Glucose, Bld 101 (*)    BUN 24 (*)    Creatinine, Ser 1.33 (*)    Total Protein 6.3 (*)    Albumin 3.3 (*)    GFR, Estimated 50 (*)    All other components within normal limits  CBC WITH DIFFERENTIAL/PLATELET - Abnormal; Notable for the following components:   WBC 11.9 (*)    RBC 3.30 (*)    Hemoglobin 10.3 (*)    HCT 30.6 (*)    Neutro Abs 9.0 (*)    Monocytes Absolute 1.5 (*)    Abs Immature Granulocytes 0.09 (*)    All other components within normal limits  CK  URINALYSIS, ROUTINE W REFLEX MICROSCOPIC      Imaging Studies ordered: I ordered imaging studies including CT head, C-spine, chest x-ray, pelvis x-ray, CT L-spine I independently visualized and interpreted imaging. I agree with the radiologist interpretation  CT aortobifem pending   Medicines ordered and prescription drug management: Meds ordered this encounter  Medications   fentaNYL (SUBLIMAZE) injection 50 mcg   iohexol (OMNIPAQUE) 350 MG/ML injection 125 mL   metoprolol tartrate (LOPRESSOR) injection 5 mg    -I have reviewed the patients home medicines and have made adjustments as needed  Critical interventions none    Cardiac Monitoring: The patient was maintained on a cardiac monitor.  I personally viewed and interpreted the cardiac monitored which showed an underlying rhythm of: A-fib with RVR  Social Determinants of Health:  Factors impacting patients care include: Currently lives alone   Reevaluation: After the interventions noted above, I reevaluated the patient and found that they have :improved  Co morbidities that  complicate the patient evaluation  Past Medical History:  Diagnosis Date   Acute diastolic CHF (congestive heart failure) (HCC) 10/25/2021   Anxiety    Aortic insufficiency    Atrial fibrillation (HCC)    Chronic diastolic CHF (congestive heart failure) (HCC)    Chronic kidney disease, stage 3a (HCC)    Diverticulitis    Dyslipidemia    Hypertension    Inferior mesenteric vein thrombosis (HCC)    Mitral regurgitation    Nonrheumatic mitral valve regurgitation 10/28/2021   Paroxysmal atrial fibrillation ([place]) 01/12/2018   Persistent atrial fibrillation (HCC)    Prostate cancer ([place])       Dispostion: I considered admission for this patient, and disposition pending completion of trauma imaging.  Please see provider assignment for continuation of workup.     Final Clinical Impression(s) / ED Diagnoses Final diagnoses:  None     '@PCDICTATION'$ @    Teressa Lower, MD 11/24/22 1520
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
## 2022-11-24 NOTE — Discharge Instructions (Signed)
Please continue to follow with your primary care physician and take your medications as prescribed.  If you notice rapid swelling of your bruising, worsening pain, numbness/weakness, additional falls he should seek emergency evaluation immediately.

## 2022-11-24 NOTE — ED Notes (Signed)
ED Provider at bedside. 

## 2022-11-28 ENCOUNTER — Telehealth: Payer: Self-pay | Admitting: Pharmacist

## 2022-11-28 NOTE — Telephone Encounter (Signed)
Routed to Dr [name]. She recommended appointment.  Appointment made for 12/01/2022 at 2:40pm. (Patient was unable to be come in 12/7 due to prior appointment with ophthalmologist)

## 2022-11-28 NOTE — Telephone Encounter (Signed)
Patient calls to tell me about his recent fall. He fell last week. Went to ED 2 days after fall (11/24/2022).  He reports that he still has some discomfort / soreness and bruising.  Slowly getting better.  He continues to have difficulty getting in and out of cars and up and down from chairs.  He would like Dr Etter Sjogren to know about event. He wants to make sure there is no further work up needed.   I mentioned that physical therapy might be helpful in future to prevent future but patient was not sure he would like to do physical therapy.  He is taking acetaminophen 2 tablets twice a day - morning and night. Recommended he try adding another dose mid day.   Forwarding information to PCP to review and for further recommendations.

## 2022-11-30 DIAGNOSIS — H43813 Vitreous degeneration, bilateral: Secondary | ICD-10-CM | POA: Diagnosis not present

## 2022-11-30 DIAGNOSIS — H35373 Puckering of macula, bilateral: Secondary | ICD-10-CM | POA: Diagnosis not present

## 2022-11-30 DIAGNOSIS — H353231 Exudative age-related macular degeneration, bilateral, with active choroidal neovascularization: Secondary | ICD-10-CM | POA: Diagnosis not present

## 2022-11-30 DIAGNOSIS — H401122 Primary open-angle glaucoma, left eye, moderate stage: Secondary | ICD-10-CM | POA: Diagnosis not present

## 2022-12-01 ENCOUNTER — Encounter: Payer: Self-pay | Admitting: Family Medicine

## 2022-12-01 ENCOUNTER — Other Ambulatory Visit (HOSPITAL_BASED_OUTPATIENT_CLINIC_OR_DEPARTMENT_OTHER): Payer: Medicare Other

## 2022-12-01 ENCOUNTER — Ambulatory Visit (INDEPENDENT_AMBULATORY_CARE_PROVIDER_SITE_OTHER): Payer: Medicare Other | Admitting: Family Medicine

## 2022-12-01 ENCOUNTER — Other Ambulatory Visit (HOSPITAL_BASED_OUTPATIENT_CLINIC_OR_DEPARTMENT_OTHER): Payer: Self-pay

## 2022-12-01 VITALS — BP 148/80 | HR 125 | Temp 97.5°F | Resp 18 | Ht 66.0 in | Wt 153.8 lb

## 2022-12-01 DIAGNOSIS — M79662 Pain in left lower leg: Secondary | ICD-10-CM

## 2022-12-01 DIAGNOSIS — S81812D Laceration without foreign body, left lower leg, subsequent encounter: Secondary | ICD-10-CM

## 2022-12-01 DIAGNOSIS — S81812A Laceration without foreign body, left lower leg, initial encounter: Secondary | ICD-10-CM

## 2022-12-01 DIAGNOSIS — T148XXA Other injury of unspecified body region, initial encounter: Secondary | ICD-10-CM | POA: Diagnosis not present

## 2022-12-01 DIAGNOSIS — Z23 Encounter for immunization: Secondary | ICD-10-CM | POA: Diagnosis not present

## 2022-12-01 DIAGNOSIS — L03116 Cellulitis of left lower limb: Secondary | ICD-10-CM

## 2022-12-01 MED ORDER — CEPHALEXIN 500 MG PO CAPS
500.0000 mg | ORAL_CAPSULE | Freq: Two times a day (BID) | ORAL | 0 refills | Status: DC
  Filled 2022-12-01: qty 20, 10d supply, fill #0

## 2022-12-01 MED ORDER — CEFTRIAXONE SODIUM 1 G IJ SOLR
1.0000 g | Freq: Once | INTRAMUSCULAR | Status: AC
  Administered 2022-12-01: 1 g via INTRAMUSCULAR

## 2022-12-01 MED ORDER — TRAMADOL HCL 50 MG PO TABS
50.0000 mg | ORAL_TABLET | Freq: Three times a day (TID) | ORAL | 0 refills | Status: DC | PRN
  Filled 2022-12-01: qty 15, 5d supply, fill #0

## 2022-12-01 NOTE — Progress Notes (Signed)
Established Patient Office Visit  Subjective   Patient ID: Mark Manrique., male    DOB: July 25, 1928  Age: 88 y.o. MRN: [id number]  Chief Complaint  Patient presents with   Fall    Fall was x1 week ago, fell on the left side.  Pt states he was bringing in the garbage cans and stripped over something and fell. Pt states landing on left buttock. Pt states having bruising and pain. Pt states the garbage can fell on top of him. Pt has a wound on left calf. Pt states he did hit his head but states bump is gone.     HPI Pt is here with a friend ---- for f/u er 1 week ago.  Pt fell backwards while putting garbage cans away at his house.  He fell on concrete and the garbage can fell on top of him.  He went to er and several xray and scans were done and they were normal.  He was sent home but with no pain meds.  Pt states he is in a lot of pain in low back and coccyx and L low leg and calf     Review of Systems  Constitutional:  Negative for fever and malaise/fatigue.  HENT:  Negative for congestion.   Eyes:  Negative for blurred vision.  Respiratory:  Negative for cough and shortness of breath.   Cardiovascular:  Positive for leg swelling. Negative for chest pain and palpitations.  Gastrointestinal:  Negative for vomiting.  Musculoskeletal:  Positive for back pain.  Skin:  Negative for rash.  Neurological:  Negative for loss of consciousness and headaches.  Endo/Heme/Allergies:  Bruises/bleeds easily.      Objective:     BP (!) 148/80 (BP Location: Left Arm, Patient Position: Sitting, Cuff Size: Normal)   Pulse (!) 125   Temp (!) 97.5 F (36.4 C) (Oral)   Resp 18   Ht '5\' 6"'$  (1.676 m)   Wt 153 lb 12.8 oz (69.8 kg)   SpO2 99%   BMI 24.82 kg/m    Physical Exam Vitals and nursing note reviewed.  Constitutional:      Appearance: He is well-developed.  HENT:     Head: Normocephalic and atraumatic.  Eyes:     Pupils: Pupils are equal, round, and reactive to light.  Neck:      Thyroid: No thyromegaly.  Cardiovascular:     Rate and Rhythm: Normal rate and regular rhythm.     Heart sounds: No murmur heard. Pulmonary:     Effort: Pulmonary effort is normal. No respiratory distress.     Breath sounds: Normal breath sounds. No wheezing or rales.  Chest:     Chest wall: No tenderness.  Musculoskeletal:     Cervical back: Normal range of motion and neck supple.     Right hip: Tenderness present. Normal range of motion. Normal strength.     Left hip: Tenderness present. Normal range of motion. Normal strength.     Right foot: Bony tenderness present. No swelling.     Left foot: Bony tenderness present. No swelling.     Comments: + L calf pain and swelling with ecchymosis  + and pain in low back and rectum due brusing   Skin:    General: Skin is warm and dry.     Findings: Bruising present.  Neurological:     Mental Status: He is alert and oriented to person, place, and time.  Psychiatric:  Behavior: Behavior normal.        Thought Content: Thought content normal.        Judgment: Judgment normal.      No results found for any visits on 12/01/22.    The ASCVD Risk score (Arnett DK, et al., 2019) failed to calculate for the following reasons:   The 2019 ASCVD risk score is only valid for ages 35 to 73    Assessment & Plan:   Problem List Items Addressed This Visit   None Visit Diagnoses     Pain of left calf    -  Primary   Relevant Medications   traMADol (ULTRAM) 50 MG tablet   cephALEXin (KEFLEX) 500 MG capsule   Other Relevant Orders   US Venous Img Lower Unilateral Left (DVT)   Td : Tetanus/diphtheria >7yo Preservative  free (Completed)   Cellulitis of left lower extremity       Relevant Medications   traMADol (ULTRAM) 50 MG tablet   cephALEXin (KEFLEX) 500 MG capsule   cefTRIAXone (ROCEPHIN) injection 1 g (Completed)   Other Relevant Orders   Td : Tetanus/diphtheria >7yo Preservative  free (Completed)   Skin abrasion        Relevant Orders   Td : Tetanus/diphtheria >7yo Preservative  free (Completed)   Laceration without foreign body, left lower leg, subsequent encounter       Relevant Orders   Td : Tetanus/diphtheria >7yo Preservative  free (Completed)       No follow-ups on file.    [name], DO

## 2022-12-04 ENCOUNTER — Ambulatory Visit (HOSPITAL_BASED_OUTPATIENT_CLINIC_OR_DEPARTMENT_OTHER)
Admission: RE | Admit: 2022-12-04 | Discharge: 2022-12-04 | Disposition: A | Discharge status: Home / Self Care | Payer: Medicare Other | Source: Ambulatory Visit | Attending: Family Medicine | Admitting: Family Medicine

## 2022-12-04 DIAGNOSIS — M79662 Pain in left lower leg: Secondary | ICD-10-CM | POA: Diagnosis not present

## 2022-12-04 DIAGNOSIS — R600 Localized edema: Secondary | ICD-10-CM | POA: Diagnosis not present

## 2022-12-06 ENCOUNTER — Telehealth: Payer: Self-pay | Admitting: Pharmacist

## 2022-12-06 ENCOUNTER — Other Ambulatory Visit: Payer: Self-pay | Admitting: Family Medicine

## 2022-12-06 DIAGNOSIS — M79662 Pain in left lower leg: Secondary | ICD-10-CM

## 2022-12-06 DIAGNOSIS — L03116 Cellulitis of left lower limb: Secondary | ICD-10-CM

## 2022-12-06 MED ORDER — TRAMADOL HCL 50 MG PO TABS: 50.0000 mg | ORAL_TABLET | Freq: Four times a day (QID) | ORAL | 0 refills | Status: DC | PRN

## 2022-12-06 NOTE — Telephone Encounter (Signed)
Patient called to request refill on tramadol. He reports he has 2 tablets left.  See below for Rx info. If refill is OK'd - please send to [facility] [address] in [place], [place].      Start Date End Date Dispense Refills Pharmacy   traMADol (ULTRAM) 50 MG tablet 12/01/2022 12/06/2022 15 tablet 0 [facility]...   Take 1 tablet (50 mg total) by mouth every 8 (eight) hours as needed for up to 5 days.

## 2022-12-13 ENCOUNTER — Telehealth: Payer: Self-pay | Admitting: Pharmacist

## 2022-12-13 NOTE — Telephone Encounter (Signed)
Patient called to give me an update on how he is doing. He states that his left leg is swollen / having edema. He has instructed by Dr Harrell Gave to use torsemide and potassium only twice a week. He is wondering if he should use more often.  He also reports weight gain of about 10lbs over that last 2 weeks, which he feels is related to edema and decreased activity. Denies shortness of breath.    He report pain in his legs has improve. But he can only end leg about 45 degrees. He wonders if he needs physical therapy or other treatments to improve leg strength.    Will forward to the Doc of the Day to review and for recommendations.

## 2022-12-14 ENCOUNTER — Other Ambulatory Visit: Payer: Self-pay | Admitting: Family Medicine

## 2022-12-14 ENCOUNTER — Other Ambulatory Visit: Payer: Self-pay | Admitting: Cardiology

## 2022-12-14 DIAGNOSIS — I5032 Chronic diastolic (congestive) heart failure: Secondary | ICD-10-CM

## 2022-12-14 NOTE — Telephone Encounter (Signed)
Rx request sent to pharmacy.  

## 2022-12-14 NOTE — Telephone Encounter (Signed)
Based on last BMET, K is normal. Should pt continue taking?

## 2022-12-14 NOTE — Telephone Encounter (Signed)
Returned call to patient,   Patient states he had a very bad fall straight onto his back. His whole left leg was bruised and damaged. He states he was seen in the ED. Nothing was broken. Patient was seen by PCP, but he notes that he is having a rather large accumulation of fluid. He states before he feel he was in the mid 140s since at least October, but today he registered at 158.   Per Dr. Harrell Gave have the patient increase torsemide '20mg'$  twice daily with potassium until weight back into the 140s. Have patient follow up first week of January.   Patient scheduled for 1/5 with Dr. Harrell Gave, instructed to call in one week if weight not going down.

## 2022-12-14 NOTE — Telephone Encounter (Signed)
Can we call Mark Stark and see how much weight he has gained? If he is up more than 5 lbs from his baseline I would have him take torsemide and potassium until his weight is back to his normal range.

## 2022-12-26 ENCOUNTER — Ambulatory Visit: Payer: Self-pay | Admitting: Licensed Clinical Social Worker

## 2022-12-26 NOTE — Patient Outreach (Signed)
  Care Coordination   Follow Up Visit Note   12/26/2022 Name: Mark Stark. MRN: [id number] DOB: [birth year]  Mark Stark. is a 89 y.o. year old male who sees [name], [name], DO for primary care. I spoke with  Mark Stark. by phone today.  What matters to the patients health and wellness today? Patient wants to complete daily ADLs as able. Client wants to drive his car on errands and short trips as needed. Client wants to socialize with friends as he is able.    Goals Addressed               This Visit's Progress     Patient wants to continue to complete daily ADLs as able.  Client wants to socialize with friends as he is able. (pt-stated)        Care Coordination Interventions:   Client and LCSW spoke of client needs via phone today.  Client and LCSW spoke of care client received from cardiologist.   Client and LCSW spoke of client support with PCP Provided counseling support for client Talked of walking of client. He said that since his recent fall he has used a cane to help him walk . He fatigues sometimes in walking and has to take rest breaks in walking Reviewed sleeping needs. He said he sleeps well. Reviewed medication procurement Discussed transport needs of client. He said he had a friend who helped client go to grocery store today to obtain needed food items. Client said he may start driving again next week. He plans to drive only on short trips, near his home. Reviewed pain issues of client.   Encouraged client to call LCSW as needed for SW support at [phone]      SDOH assessments and interventions completed:  Yes  SDOH Interventions Today    Flowsheet Row Most Recent Value  SDOH Interventions   Physical Activity Interventions Other (Comments)  [may fatigue when walking,  uses a cane to help him walk]  Stress Interventions Provide Counseling  [client has some stress related to managing medical needs]        Care Coordination Interventions:   Yes, provided   Follow up plan: Follow up call scheduled for 01/24/23 at 10:30 AM     Encounter Outcome:  Pt. Visit Completed   [name].Honestee Revard MSW, [place] [employer] representative [facility] Care Management [phone]

## 2022-12-26 NOTE — Patient Instructions (Signed)
Visit Information  Thank you for taking time to visit with me today. Please don't hesitate to contact me if I can be of assistance to you before our next scheduled telephone appointment.  Following are the goals we discussed today:   Our next appointment is by telephone on 01/24/23 at 10:30 AM  Please call the care guide team at [phone] if you need to cancel or reschedule your appointment.   If you are experiencing a Mental Health or [place] or need someone to talk to, please go to [facility] [place] [phone])   Following is a copy of your full plan of care:   Care Coordination Interventions:  Client and LCSW spoke of client needs via phone today.  Client and LCSW spoke of care client received from cardiologist.   Client and LCSW spoke of client support with PCP Provided counseling support for client Talked of walking of client. He said that since his recent fall he has used a cane to help him walk . He fatigues sometimes in walking and has to take rest breaks in walking Reviewed sleeping needs. He said he sleeps well. Reviewed medication procurement Discussed transport needs of client. He said he had a friend who helped client go to grocery store today to obtain needed food items. Client said he may start driving again next week. He plans to drive only on short trips, near his home. Reviewed pain issues of client.   Encouraged client to call LCSW as needed for SW support at [phone]  Mark Stark was given information about Care Management services by the embedded care coordination team including:  Care Management services include personalized support from designated clinical staff supervised by his physician, including individualized plan of care and coordination with other care providers 24/7 contact phone numbers for assistance for urgent and routine care needs. The patient may stop CCM services at any  time (effective at the end of the month) by phone call to the office staff.  Patient agreed to services and verbal consent obtained.   Mark Stark.[name] MSW, [place] [employer] representative [facility] Care Management [phone]

## 2022-12-29 ENCOUNTER — Encounter (HOSPITAL_BASED_OUTPATIENT_CLINIC_OR_DEPARTMENT_OTHER): Payer: Self-pay | Admitting: Cardiology

## 2022-12-29 ENCOUNTER — Ambulatory Visit (HOSPITAL_BASED_OUTPATIENT_CLINIC_OR_DEPARTMENT_OTHER): Payer: Medicare Other | Admitting: Cardiology

## 2022-12-29 ENCOUNTER — Ambulatory Visit (INDEPENDENT_AMBULATORY_CARE_PROVIDER_SITE_OTHER): Payer: Medicare Other | Admitting: Cardiology

## 2022-12-29 VITALS — BP 128/78 | HR 118 | Ht 66.0 in | Wt 145.0 lb

## 2022-12-29 DIAGNOSIS — I340 Nonrheumatic mitral (valve) insufficiency: Secondary | ICD-10-CM

## 2022-12-29 DIAGNOSIS — I5032 Chronic diastolic (congestive) heart failure: Secondary | ICD-10-CM | POA: Diagnosis not present

## 2022-12-29 DIAGNOSIS — D6869 Other thrombophilia: Secondary | ICD-10-CM

## 2022-12-29 DIAGNOSIS — Z7901 Long term (current) use of anticoagulants: Secondary | ICD-10-CM | POA: Diagnosis not present

## 2022-12-29 DIAGNOSIS — I4821 Permanent atrial fibrillation: Secondary | ICD-10-CM

## 2022-12-29 NOTE — Progress Notes (Signed)
Cardiology Office Note:    Date:  12/31/2022   ID:  Mark Stark., DOB Nov 13, 1928, MRN [id number]  PCP:  Zola Button, Grayling Congress, DO  Cardiologist:  Jodelle Red, MD  Referring MD: Zola Button, Grayling Congress, *   CC: Follow up  History of Present Illness:    Mark Elliff. is a 89 y.o. male with a hx of permanent atrial fibrillation, chronic diastolic CHF, CKD stage 3a, hypertension, dyslipidemia, aortic insufficiency, mitral regurgitation, inferior mesenteric vein thrombosis, prostate cancer, and anxiety, who is seen for follow up.   Cardiovascular history: On presentation to the hospital 10/25/2021 he complained of worsening shortness of breath, LE edema, and orthopnea. CXR revealed small bilateral effusions and mild interstitial edema. He was admitted for congestive heart failure. He did well for approximately 9 mos, but he was admitted to the hospital on 08/22/2022 after calling EMS due to progressive generalized weakness. It was also noted he was "seeing things". He denied any chest or abdominal pain and/or discomfort. EMS noted his blood pressure was low on arrival and improved with fluid therapy. He did exhibit dysarthria on examination at the ER. Given his symptoms and history he was admitted from the ER to be treated in the hospital and undergo further testing. Both initial MRI and and additional MRI showed no evidence of CVA. Symptoms improved   At his last appointment, he was still experiencing BLE but with some improvement. Heart rates were typically 60s-70s up to 100s. His Home blood pressures were well controlled and his weights had been stable around 143-144 lbs. He was completing more activities such as yard work. He had also developed glaucoma. If symptoms become difficult to manage, planned to consider TEE to determine if mitraclip would be beneficial. No changes made to metoprolol dose.   He was seen in the ED 11/24/2022 for a recent mechanical fall. He had slipped and landed  on his back, striking his head against the ground. He was able to walk but had difficulty getting in and out of a chair due to leg pain. Initial laboratory evaluation with a BUN of 24, creatinine 1.33, leukocytosis to 11.9, hemoglobin 10.3 which was noted to be a significant drop for this patient. CT head and C-spine with no acute traumatic findings.  Chest x-ray, pelvis x-ray with no acute fracture.  CT L-spine with no acute fracture but did show multilevel foraminal stenosis.  He was found to have some small decreased pulses on the left and thus a CTA aortobifem was obtained. Discharged in stable condition.   Today, he is accompanied by a friend. His mechanical fall was on 11/22/22. Since then he still has a raised, erythematous lesion of his left shin with abrasive skin wounds/discoloration ranging from his left ankle to just below the knee. This used to be constantly painful, but has since gradually diminished to rare twinges of pain. He does complain of mild numbness in that area. He is concerned that it is taking a long time to heal. He believes the raised lesion resulted from the trash can lid falling onto that area of his leg; previously this was an open wound with slight bleeding but is now closed.  He states he is doing well on his torsemide BID regimen. He has tried using compression socks but is unable to tolerate them. His weight has decreased from 159 lb prior to this change, to mostly 139-140 lbs at home. Occasionally he may skip a dose of torsemide if he  needs to go somewhere, but has not noticed significant changes when he does. Lately his blood pressure is stable.  HR range 60-110 at home.  Every day he tries to be active, either with walking for 40 minutes or other activities. He walks a little stiffly, but believes this is related to his recent fall. By the end of the day he usually felt exhausted. This seems to be gradually improving, however.   Although he has tried to stick with a  low sodium diet, he admits to being less diligent now.  He denies any palpitations, chest pain, or shortness of breath. No lightheadedness, headaches, syncope, orthopnea, or PND.    Past Medical History:  Diagnosis Date   Acute diastolic CHF (congestive heart failure) (HCC) 10/25/2021   Anxiety    Aortic insufficiency    Atrial fibrillation (HCC)    Chronic diastolic CHF (congestive heart failure) (HCC)    Chronic kidney disease, stage 3a (HCC)    Diverticulitis    Dyslipidemia    Hypertension    Inferior mesenteric vein thrombosis (HCC)    Mitral regurgitation    Nonrheumatic mitral valve regurgitation 10/28/2021   Paroxysmal atrial fibrillation (HCC) 01/12/2018   Persistent atrial fibrillation (HCC)    Prostate cancer [facility])     Past Surgical History:  Procedure Laterality Date   CARDIOVERSION N/A 03/01/2018   Procedure: CARDIOVERSION;  Surgeon: Lennette Bihari, MD;  Location: [facility] ENDOSCOPY;  Service: Cardiovascular;  Laterality: N/A;   CARDIOVERSION N/A 08/26/2021   Procedure: CARDIOVERSION;  Surgeon: Chrystie Nose, MD;  Location: [facility] ENDOSCOPY;  Service: Cardiovascular;  Laterality: N/A;   CARDIOVERSION N/A 12/06/2021   Procedure: CARDIOVERSION;  Surgeon: Little Ishikawa, MD;  Location: [facility] ENDOSCOPY;  Service: Cardiovascular;  Laterality: N/A;   CATARACT EXTRACTION  01/27/2010   right   CATARACT EXTRACTION  03/04/2010   left   Prostate seed implants     PROSTATE SURGERY      Current Medications: Current Outpatient Medications on File Prior to Visit  Medication Sig   apixaban (ELIQUIS) 5 MG TABS tablet Take 1 tablet (5 mg total) by mouth 2 (two) times daily.   b complex vitamins tablet Take 1 tablet by mouth daily.   Calcium Carbonate-Vitamin D 600-400 MG-UNIT tablet Take 1 tablet by mouth daily.   clorazepate (TRANXENE) 15 MG tablet TAKE 1 TABLET BY MOUTH EVERYDAY AT BEDTIME   cromolyn (NASALCROM) 5.2 MG/ACT nasal spray Place 2 sprays into the nose 2 (two) times  daily.   fenofibrate 160 MG tablet TAKE 1 TABLET BY MOUTH EVERY DAY   fish oil-omega-3 fatty acids 1000 MG capsule Take 1 g by mouth 3 (three) times daily.   latanoprost (XALATAN) 0.005 % ophthalmic solution Place 1 drop into the left eye at bedtime.   metoprolol succinate (TOPROL XL) 25 MG 24 hr tablet Take 0.5 tablets (12.5 mg total) by mouth daily.   Multiple Vitamins-Minerals (MULTIVITAMIN PO) Take 1 tablet by mouth daily.   Multiple Vitamins-Minerals (PRESERVISION AREDS 2) CAPS 2 po qd (Patient taking differently: Take 1 tablet by mouth in the morning and at bedtime.)   potassium chloride (KLOR-CON) 10 MEQ tablet TAKE 1 TABLET BY MOUTH EVERY DAY   simvastatin (ZOCOR) 10 MG tablet TAKE 1 TABLET BY MOUTH EVERYDAY AT BEDTIME (Patient taking differently: Take 10 mg by mouth at bedtime.)   tamsulosin (FLOMAX) 0.4 MG CAPS capsule Take 0.4 mg by mouth at bedtime.   torsemide (DEMADEX) 20 MG tablet Take 1 tablet (20  mg total) by mouth daily.   vitamin C (ASCORBIC ACID) 500 MG tablet Take 500 mg by mouth daily.   No current facility-administered medications on file prior to visit.     Allergies:   Sulfonamide derivatives   Social History   Tobacco Use   Smoking status: Never   Smokeless tobacco: Never  Vaping Use   Vaping Use: Never used  Substance Use Topics   Alcohol use: No    Comment: Little wine occasionally    Drug use: No    Family History: family history includes Alzheimer's disease in his mother; Cancer (age of onset: 77) in his brother; Dementia in his mother; Mental illness in his mother.  ROS:   Please see the history of present illness. (+) Mechanical fall (+) Raised lesion of LLE (+) Skin discoloration/abrasion of LLE (+) Fatigue All other systems are reviewed and negative.    EKGs/Labs/Other Studies Reviewed:    The following studies were reviewed today:  LLE Venous Doppler  12/04/2022: IMPRESSION: 1. No evidence of acute left lower extremity deep venous  thrombosis. Visible calf veins appear to remain patent. 2. Pronounced subcutaneous edema from the mid thigh distally. 3. Small focus of chronic thrombus suspected in the left distal femoral vein (images 27-30).   CTA  Aorta  11/24/2022: IMPRESSION: 1. No acute abnormality of the lower extremity arteries. 2. 7.9 x 4.8 x 5.9 cm hematoma in the left gluteal musculature. 3. 6.1 x 3.4 x 1.9 cm hematoma in the left anteromedial shin. 4. 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 5. 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 6. Curvilinear osseous fragment seen adjacent to the lateral cortex of the left lateral femoral condyle may be sequelae of remote or acute trauma. Please correlate with point tenderness. 7. Diffuse colonic diverticulosis.   Aortic Atherosclerosis (ICD10-I70.0).  Echo 08/23/2022: IMPRESSIONS    1. Left ventricular ejection fraction, by estimation, is 60 to 65%. The  left ventricle has normal function. The left ventricle has no regional  wall motion abnormalities. There is mild concentric left ventricular  hypertrophy. Left ventricular diastolic  function could not be evaluated.   2. Right ventricular systolic function is normal. The right ventricular  size is normal. There is mildly elevated pulmonary artery systolic  pressure. The estimated right ventricular systolic pressure is 42.3 mmHg.   3. Left atrial size was severely dilated.   4. Right atrial size was mildly dilated.   5. The mitral valve is degenerative. Severe mitral valve regurgitation.  Mild mitral stenosis. The mean mitral valve gradient is 4.2 mmHg with  average heart rate of 103 bpm. Moderate mitral annular calcification.   6. The aortic valve is tricuspid. There is moderate calcification of the  aortic valve. There is moderate thickening of the aortic valve.  Aortic  valve regurgitation is mild to moderate. Mild aortic valve stenosis.   Comparison(s): No significant change from prior study. Prior images  reviewed side by side.   Echo 10/26/2021:  1. Left ventricular ejection fraction, by estimation, is 50 to 55%. The  left ventricle has normal function. The left ventricle has no regional  wall motion abnormalities. There is mild left ventricular hypertrophy.  Left ventricular diastolic parameters  are indeterminate.   2. Right ventricular systolic function is normal. The right ventricular  size is normal. There is  moderately elevated pulmonary artery systolic  pressure.   3. Left atrial size was moderately dilated.   4. Posterior leaflet appears proplapsed. MR is moderate to severe, the  jet is eccentric and may underestiamte the severity.   5. Aortic valve is moderately calcified. The aortic valve is normal in  structure. Aortic valve regurgitation is moderate. No aortic stenosis is  present.   6. The inferior vena cava is dilated in size with >50% respiratory  variability, suggesting right atrial pressure of 8 mmHg.   Comparison(s): No prior Echocardiogram.   Conclusion(s)/Recommendation(s): Patient has moderate to severe mitral  regurgitation; consider TEE if clinically indicated.   Monitor 01/21/2018: Atrial fib Rare 2 - 3 second pauses.  EKG:  EKG is personally reviewed. Note that HR in the office/on ECG typically much higher than when he is at home 12/29/2022: atrial fibrillation at 118 bpm  10/23/22: atrial fibrillation at 108 bpm  09/07/2022: atypical atrial flutter at 123 bpm 08/14/2022:  atrial fibrillation at 114 bpm 02/13/2022: atrial fibrillation at 82 bpm 01/03/2022: EKG was not ordered. 11/07/2021: atrial fibrillation at 66 bpm  Recent Labs: 08/22/2022: B Natriuretic Peptide 402.7 08/25/2022: Magnesium 2.0 11/24/2022: ALT 21; BUN 24; Creatinine, Ser 1.33; Hemoglobin 10.3; Platelets 180; Potassium 4.2; Sodium 132   Recent  Lipid Panel    Component Value Date/Time   CHOL 108 08/10/2022 1344   TRIG 61.0 08/10/2022 1344   HDL 47.60 08/10/2022 1344   CHOLHDL 2 08/10/2022 1344   VLDL 12.2 08/10/2022 1344   LDLCALC 48 08/10/2022 1344   LDLCALC 36 02/10/2022 1401    Physical Exam:    VS:  BP 128/78 (BP Location: Right Arm, Patient Position: Sitting, Cuff Size: Normal)   Pulse (!) 118   Ht 5\' 6"  (1.676 m)   Wt 145 lb (65.8 kg)   BMI 23.40 kg/m     Wt Readings from Last 3 Encounters:  12/29/22 145 lb (65.8 kg)  12/01/22 153 lb 12.8 oz (69.8 kg)  11/24/22 158 lb (71.7 kg)    GEN: Well nourished, well developed in no acute distress HEENT: Normal, moist mucous membranes NECK: No JVD CARDIAC: irregularly irregular rhythm, normal S1 and S2, no rubs or gallops. 1/6 systolic murmur. VASCULAR: Radial and DP pulses 2+ bilaterally. No carotid bruits RESPIRATORY:  Clear to auscultation without rales, wheezing or rhonchi  ABDOMEN: Soft, non-tender, non-distended MUSCULOSKELETAL:  Ambulates independently SKIN: Warm and dry, trace LE edema bilaterally. Left anterior/medial shin with localized area of fluctuance, no warmth or drainage NEUROLOGIC:  Alert and oriented x 3. No focal neuro deficits noted. PSYCHIATRIC:  Normal affect    ASSESSMENT:    1. Permanent atrial fibrillation (HCC)   2. Secondary hypercoagulable state (HCC)   3. Long term current use of anticoagulant   4. Chronic diastolic CHF (congestive heart failure) (HCC)   5. Nonrheumatic mitral valve regurgitation     PLAN:    Chronic diastolic heart failure Moderate to severe mitral regurgitation when volume overloaded -NYHA class 3 on admission, now NYHA class I-II -echo with low normal EF, moderate to severe MR at peak congestion -murmur much quieter when euvolemic -continue metoprolol succinate 12.5 mg daily, avoid bradycardia but also avoid tachycardia with afib, Heart rates at home much lower typically than in office -we discussed  management/dosing of torsemide. He is euvolemic today, discussed changing to daily torsemide and given PRN instructions on when to take a second daily dose -if symptoms become difficult to manage, would consider TEE to determine  if mitraclip would be beneficial. He prefers to avoid procedures based on prior conversations, but if otherwise we cannot manage his symptoms, would re-discuss -bmet today to monitor Cr/K with current diuretic    Atrial fibrillation, now permanent Atypical atrial flutter -CHA2DS2/VAS Stroke Risk Points=4  -continue apixaban 5 mg BID. Meets age criteria, monitor renal function. Weight is >60 kg -did not hold rhythm with cardioversion or amiodarone followed by cardioversion. Asymptomatic. No further plans for rhythm control -has been a balance between optimizing heart rate and preventing hypotension. Discussed options, we will continue current dose of metoprolol    Hyperlipidemia -continue simvastatin, reasonable for age -on fenofibrate as well  Cardiac risk counseling and prevention recommendations: -recommend heart healthy/Mediterranean diet, with whole grains, fruits, vegetable, fish, lean meats, nuts, and olive oil. Limit salt. -recommend moderate walking, 3-5 times/week for 30-50 minutes each session. Aim for at least 150 minutes.week. Goal should be pace of 3 miles/hours, or walking 1.5 miles in 30 minutes -recommend avoidance of tobacco products. Avoid excess alcohol. -ASCVD risk score: The ASCVD Risk score (Arnett DK, et al., 2019) failed to calculate for the following reasons:   The 2019 ASCVD risk score is only valid for ages 18 to 2    Plan for follow up: 3 months, or sooner as needed.  Medication Adjustments/Labs and Tests Ordered: Current medicines are reviewed at length with the patient today.  Concerns regarding medicines are outlined above.   Orders Placed This Encounter  Procedures   EKG 12-Lead   No orders of the defined types were placed in  this encounter.  Patient Instructions  Medication Instructions:  It's ok to go back to torsemide once a day. If your weight goes up more than 3 lbs (from 139-140 lbs on your home scale), or your swelling is worsening, increase to torsemide twice a day until your weight goes back to your normal.  Labwork: NONE  Testing/Procedures: NONE  Follow-Up: 3 MONTHS       I,Mathew Stumpf,acting as a Neurosurgeon for [employer], MD.,have documented all relevant documentation on the behalf of Jodelle Red, MD,as directed by  Jodelle Red, MD while in the presence of Jodelle Red, MD.   I, Jodelle Red, MD, have reviewed all documentation for this visit. The documentation on 12/31/22 for the exam, diagnosis, procedures, and orders are all accurate and complete.   Total time of encounter: 44 minutes total time of encounter, including 34 minutes spent in face-to-face patient care. This time includes coordination of care and counseling regarding above conditions. Remainder of non-face-to-face time involved reviewing chart documents/testing relevant to the patient encounter and documentation in the medical record.  Jodelle Red, MD, PhD, [facility]  [facility]     Signed, Jodelle Red, MD PhD 12/31/2022     [facility]

## 2022-12-29 NOTE — Patient Instructions (Addendum)
Medication Instructions:  It's ok to go back to torsemide once a day. If your weight goes up more than 3 lbs (from 139-140 lbs on your home scale), or your swelling is worsening, increase to torsemide twice a day until your weight goes back to your normal.  Labwork: NONE  Testing/Procedures: NONE  Follow-Up: 3 MONTHS

## 2022-12-31 ENCOUNTER — Encounter (HOSPITAL_BASED_OUTPATIENT_CLINIC_OR_DEPARTMENT_OTHER): Payer: Self-pay | Admitting: Cardiology

## 2023-01-05 ENCOUNTER — Ambulatory Visit (INDEPENDENT_AMBULATORY_CARE_PROVIDER_SITE_OTHER): Payer: Medicare Other | Admitting: Family Medicine

## 2023-01-05 ENCOUNTER — Encounter: Payer: Self-pay | Admitting: Family Medicine

## 2023-01-05 VITALS — BP 106/80 | HR 128 | Temp 98.1°F | Resp 18 | Ht 66.0 in | Wt 145.0 lb

## 2023-01-05 DIAGNOSIS — L03116 Cellulitis of left lower limb: Secondary | ICD-10-CM | POA: Diagnosis not present

## 2023-01-05 DIAGNOSIS — S8012XA Contusion of left lower leg, initial encounter: Secondary | ICD-10-CM

## 2023-01-05 MED ORDER — DOXYCYCLINE HYCLATE 100 MG PO TABS: 100.0000 mg | ORAL_TABLET | Freq: Two times a day (BID) | ORAL | 0 refills | Status: DC

## 2023-01-05 NOTE — Assessment & Plan Note (Signed)
Warm compresses  Doxy sent in for 10 days

## 2023-01-05 NOTE — Progress Notes (Signed)
Subjective:   By signing my name below, I, Shehryar Baig, attest that this documentation has been prepared under the direction and in the presence of [name], DO. 01/05/2023   Patient ID: Mark Stark., male    DOB: 1928-08-15, 89 y.o.   MRN: [id number]  Chief Complaint  Patient presents with   Fall    Pt states left leg and ankle are better from the fall. Pt states having swelling and weeping legs. Legs are red and purple.    Follow-up    HPI Patient is in today for an office visit to f/u from his fall and cellulitis   This past Sunday, upon returning home and every day since, he has observed a clear liquid around his left ankle coming from a weepy sore on his left lower leg. He continues having scaly skin. Swelling continues. The wounded area is painful to the touch. He is able to sit, stand, walk and bend over. Healing is slower than expected. He will go to the wound clinic if the state of the wound doesn't improve.   Past Medical History:  Diagnosis Date   Acute diastolic CHF (congestive heart failure) (HCC) 10/25/2021   Anxiety    Aortic insufficiency    Atrial fibrillation (HCC)    Chronic diastolic CHF (congestive heart failure) (HCC)    Chronic kidney disease, stage 3a (HCC)    Diverticulitis    Dyslipidemia    Hypertension    Inferior mesenteric vein thrombosis (HCC)    Mitral regurgitation    Nonrheumatic mitral valve regurgitation 10/28/2021   Paroxysmal atrial fibrillation ([place]) 01/12/2018   Persistent atrial fibrillation ([place])    Prostate cancer [facility])     Past Surgical History:  Procedure Laterality Date   CARDIOVERSION N/A 03/01/2018   Procedure: CARDIOVERSION;  Surgeon: Troy Sine, MD;  Location: [place];  Service: Cardiovascular;  Laterality: N/A;   CARDIOVERSION N/A 08/26/2021   Procedure: CARDIOVERSION;  Surgeon: Pixie Casino, MD;  Location: [facility] ENDOSCOPY;  Service: Cardiovascular;  Laterality: N/A;   CARDIOVERSION N/A 12/06/2021    Procedure: CARDIOVERSION;  Surgeon: Donato Heinz, MD;  Location: [place];  Service: Cardiovascular;  Laterality: N/A;   CATARACT EXTRACTION  01/27/2010   right   CATARACT EXTRACTION  03/04/2010   left   Prostate seed implants     PROSTATE SURGERY      Family History  Problem Relation Age of Onset   Dementia Mother    Alzheimer's disease Mother    Mental illness Mother        ALZ DISEASE   Cancer Brother 45       pancreatic cancer    Social History   Socioeconomic History   Marital status: Widowed    Spouse name: Not on file   Number of children: Not on file   Years of education: Not on file   Highest education level: Not on file  Occupational History   Occupation: Professor---RETIRED    Employer: RETIRED    Comment: Retired  Tobacco Use   Smoking status: Never   Smokeless tobacco: Never  Vaping Use   Vaping Use: Never used  Substance and Sexual Activity   Alcohol use: No    Comment: Little wine occasionally    Drug use: No   Sexual activity: Never    Partners: Female  Other Topics Concern   Not on file  Social History Narrative   Not on file   Social Determinants of  Health   Financial Resource Strain: Low Risk  (02/17/2022)   Overall Financial Resource Strain (CARDIA)    Difficulty of Paying Living Expenses: Not hard at all  Food Insecurity: No Food Insecurity (08/29/2022)   Hunger Vital Sign    Worried About Running Out of Food in the Last Year: Never true    Ran Out of Food in the Last Year: Never true  Transportation Needs: No Transportation Needs (08/29/2022)   PRAPARE - Hydrologist (Medical): No    Lack of Transportation (Non-Medical): No  Physical Activity: Insufficiently Active (12/26/2022)   Exercise Vital Sign    Days of Exercise per Week: 1 day    Minutes of Exercise per Session: 10 min  Stress: Stress Concern Present (12/26/2022)   [place]    Feeling of Stress : To some extent  Social Connections: Moderately Integrated (09/26/2022)   Social Connection and Isolation Panel [NHANES]    Frequency of Communication with Friends and Family: Twice a week    Frequency of Social Gatherings with Friends and Family: Twice a week    Attends Religious Services: More than 4 times per year    Active Member of [employer] or Organizations: Yes    Attends Archivist Meetings: Never    Marital Status: Widowed  Recent Concern: Social Connections - Moderately Isolated (08/29/2022)   Social Connection and Isolation Panel [NHANES]    Frequency of Communication with Friends and Family: Three times a week    Frequency of Social Gatherings with Friends and Family: Twice a week    Attends Religious Services: More than 4 times per year    Active Member of [employer] or Organizations: No    Attends Archivist Meetings: Never    Marital Status: Widowed  Intimate Partner Violence: Not At Risk (02/17/2022)   Humiliation, Afraid, Rape, and Kick questionnaire    Fear of Current or Ex-Partner: No    Emotionally Abused: No    Physically Abused: No    Sexually Abused: No    Outpatient Medications Prior to Visit  Medication Sig Dispense Refill   apixaban (ELIQUIS) 5 MG TABS tablet Take 1 tablet (5 mg total) by mouth 2 (two) times daily. 180 tablet 1   b complex vitamins tablet Take 1 tablet by mouth daily.     Calcium Carbonate-Vitamin D 600-400 MG-UNIT tablet Take 1 tablet by mouth daily.     clorazepate (TRANXENE) 15 MG tablet TAKE 1 TABLET BY MOUTH EVERYDAY AT BEDTIME 30 tablet 1   cromolyn (NASALCROM) 5.2 MG/ACT nasal spray Place 2 sprays into the nose 2 (two) times daily.     fenofibrate 160 MG tablet TAKE 1 TABLET BY MOUTH EVERY DAY 90 tablet 1   fish oil-omega-3 fatty acids 1000 MG capsule Take 1 g by mouth 3 (three) times daily.     latanoprost (XALATAN) 0.005 % ophthalmic solution Place 1 drop into the left eye at bedtime.      metoprolol succinate (TOPROL XL) 25 MG 24 hr tablet Take 0.5 tablets (12.5 mg total) by mouth daily. 45 tablet 3   Multiple Vitamins-Minerals (MULTIVITAMIN PO) Take 1 tablet by mouth daily.     Multiple Vitamins-Minerals (PRESERVISION AREDS 2) CAPS 2 po qd (Patient taking differently: Take 1 tablet by mouth in the morning and at bedtime.)     potassium chloride (KLOR-CON) 10 MEQ tablet TAKE 1 TABLET BY MOUTH EVERY DAY 90 tablet  1   simvastatin (ZOCOR) 10 MG tablet TAKE 1 TABLET BY MOUTH EVERYDAY AT BEDTIME (Patient taking differently: Take 10 mg by mouth at bedtime.) 90 tablet 2   tamsulosin (FLOMAX) 0.4 MG CAPS capsule Take 0.4 mg by mouth at bedtime.     torsemide (DEMADEX) 20 MG tablet Take 1 tablet (20 mg total) by mouth daily. 90 tablet 1   vitamin C (ASCORBIC ACID) 500 MG tablet Take 500 mg by mouth daily.     No facility-administered medications prior to visit.    Allergies  Allergen Reactions   Sulfonamide Derivatives Other (See Comments)    Unknown - patient cannot remember reaction    Review of Systems  Constitutional:  Negative for fever and malaise/fatigue.  HENT:  Negative for congestion.   Eyes:  Negative for blurred vision.  Respiratory:  Negative for shortness of breath.   Cardiovascular:  Positive for leg swelling. Negative for chest pain and palpitations.  Gastrointestinal:  Negative for abdominal pain, blood in stool and nausea.  Genitourinary:  Negative for dysuria and frequency.  Musculoskeletal: Negative.  Negative for falls.  Skin:  Negative for rash.       (+)sore on left lower leg/shin Skin flaking  Still red but swelling is much improved   Neurological:  Negative for dizziness, loss of consciousness and headaches.  Endo/Heme/Allergies:  Negative for environmental allergies.  Psychiatric/Behavioral:  Negative for depression. The patient is not nervous/anxious.        Objective:    Physical Exam Vitals and nursing note reviewed.  Constitutional:       General: He is not in acute distress.    Appearance: Normal appearance. He is not ill-appearing.  HENT:     Head: Normocephalic and atraumatic.     Right Ear: External ear normal.     Left Ear: External ear normal.  Eyes:     Extraocular Movements: Extraocular movements intact.     Pupils: Pupils are equal, round, and reactive to light.  Cardiovascular:     Rate and Rhythm: Normal rate and regular rhythm.     Heart sounds: Normal heart sounds. No murmur heard.    No gallop.  Pulmonary:     Effort: Pulmonary effort is normal. No respiratory distress.     Breath sounds: Normal breath sounds. No wheezing or rales.  Musculoskeletal:        General: Swelling (Left lower leg) and tenderness (Left lower leg) present.     Comments: Tr pitting edema L low leg--- much better then previous visit ,  hematoma on shin --tender to touch  Low leg--- +errythema  No calf pain  Skin:    General: Skin is warm and dry.  Neurological:     Mental Status: He is alert and oriented to person, place, and time.  Psychiatric:        Judgment: Judgment normal.     BP 106/80 (BP Location: Left Arm, Patient Position: Sitting, Cuff Size: Normal)   Pulse (!) 128   Temp 98.1 F (36.7 C) (Oral)   Resp 18   Ht '5\' 6"'$  (1.676 m)   Wt 145 lb (65.8 kg)   SpO2 99%   BMI 23.40 kg/m  Wt Readings from Last 3 Encounters:  01/05/23 145 lb (65.8 kg)  12/29/22 145 lb (65.8 kg)  12/01/22 153 lb 12.8 oz (69.8 kg)       Assessment & Plan:  Cellulitis of left lower extremity -     Doxycycline Hyclate; Take 1  tablet (100 mg total) by mouth 2 (two) times daily.  Dispense: 20 tablet; Refill: 0 -     WOUND CULTURE -     Ambulatory referral to Wound Clinic  Hematoma of left lower leg Assessment & Plan: Warm compresses  Doxy sent in for 10 days      I, [name], DO, personally preformed the services described in this documentation.  All medical record entries made by the scribe were at my direction  and in my presence.  I have reviewed the chart and discharge instructions (if applicable) and agree that the record reflects my personal performance and is accurate and complete. 01/05/2023   I,Shehryar Baig,acting as a scribe for [name], DO.,have documented all relevant documentation on the behalf of [name], DO,as directed by  [name], DO while in the presence of [name], DO.   [name], DO

## 2023-01-10 LAB — WOUND CULTURE
MICRO NUMBER:: 14424793
SPECIMEN QUALITY:: ADEQUATE

## 2023-01-11 DIAGNOSIS — H353231 Exudative age-related macular degeneration, bilateral, with active choroidal neovascularization: Secondary | ICD-10-CM | POA: Diagnosis not present

## 2023-01-24 ENCOUNTER — Ambulatory Visit: Payer: Self-pay | Admitting: Licensed Clinical Social Worker

## 2023-01-24 NOTE — Patient Instructions (Signed)
Visit Information  Thank you for taking time to visit with me today. Please don't hesitate to contact me if I can be of assistance to you before our next scheduled telephone appointment.  Following are the goals we discussed today:   Our next appointment is by telephone on 02/27/23 at 2:00 PM   Please call the care guide team at [phone] if you need to cancel or reschedule your appointment.   If you are experiencing a Mental Health or [place] or need someone to talk to, please go to [facility] [place] [phone])   Following is a copy of your full plan of care:   Care Coordination Interventions:  Client and LCSW spoke of client needs via phone today.  Client and LCSW spoke of care client received from cardiologist.   Client and LCSW spoke of client support with PCP. Client said he has appointment with PCP in February of 2024 Provided counseling support for client Talked of walking of client. He said  he is walking better now. He tries to walk daily for exercise and he feels that this daily walking has been helpful to client.  Reviewed medication procurement Discussed transport needs of client. He said he  is driving his car on short trips as needed. He goes to grocery store, medical appointments, pharmacy.  Discussed socialization of client. He said he has friends with whom he socializes. He likes to have meals with friends and enjoys socializing with his many friends Reviewed pain issues of client.   Discussed edema issues. He said his edema issues have improved recently Reminded client of program support with RN and Pharmacist. He was appreciative of this information Encouraged client to call LCSW as needed for SW support at [phone]  Mark Stark was given information about Care Management services by the embedded care coordination team including:  Care Management services include personalized support  from designated clinical staff supervised by his physician, including individualized plan of care and coordination with other care providers 24/7 contact phone numbers for assistance for urgent and routine care needs. The patient may stop CCM services at any time (effective at the end of the month) by phone call to the office staff.  Patient agreed to services and verbal consent obtained.   [name].[name] MSW, [employer] representative [facility] Care Management [phone]

## 2023-01-24 NOTE — Patient Outreach (Signed)
  Care Coordination   Follow Up Visit Note   01/24/2023 Name: Mark Stark. MRN: [id number] DOB: 09-Jul-1928  Mark Stark. is a 89 y.o. year old male who sees Mark Stark, Mark Apa, DO for primary care. I spoke with  Mark Stark. by phone today.  What matters to the patients health and wellness today?  Patient wants to continue to complete daily ADLs as able. Client wants to socialize with friends as he is able.     Goals Addressed               This Visit's Progress     Patient wants to continue to complete daily ADLs as able.  Client wants to socialize with friends as he is able. (pt-stated)        Care Coordination Interventions:   Client and LCSW spoke of client needs via phone today.  Client and LCSW spoke of care client received from cardiologist.   Client and LCSW spoke of client support with PCP. Client said he has appointment with PCP in February of 2024 Provided counseling support for client Talked of walking of client. He said  he is walking better now. He tries to walk daily for exercise and he feels that this daily walking has been helpful to client.  Reviewed medication procurement Discussed transport needs of client. He said he  is driving his car on short trips as needed. He goes to grocery store, medical appointments, pharmacy.  Discussed socialization of client. He said he has friends with whom he socializes. He likes to have meals with friends and enjoys socializing with his many friends Reviewed pain issues of client.   Discussed edema issues. He said his edema issues have improved recently Reminded client of program support with RN and Pharmacist. He was appreciative of this information Encouraged client to call LCSW as needed for SW support at [phone]       SDOH assessments and interventions completed:  Yes  SDOH Interventions Today    Flowsheet Row Most Recent Value  SDOH Interventions   Depression Interventions/Treatment  Counseling   Physical Activity Interventions Other (Comments)  [client tries to walk for a few minutes each day]  Stress Interventions Provide Counseling  [client may have some stress in relation to managing medical needs. At some risk for falls]        Care Coordination Interventions:  Yes, provided   Follow up plan: Follow up call scheduled for 02/27/23 at 2:00 PM     Encounter Outcome:  Pt. Visit Completed   Mark Stark.Taeshawn Helfman MSW, [employer] representative [facility] Care Management [phone]

## 2023-01-30 ENCOUNTER — Other Ambulatory Visit: Payer: Self-pay | Admitting: Cardiology

## 2023-02-06 ENCOUNTER — Other Ambulatory Visit: Payer: Medicare Other

## 2023-02-09 ENCOUNTER — Encounter: Payer: Self-pay | Admitting: Family Medicine

## 2023-02-09 ENCOUNTER — Ambulatory Visit (INDEPENDENT_AMBULATORY_CARE_PROVIDER_SITE_OTHER): Payer: Medicare Other | Admitting: Family Medicine

## 2023-02-09 VITALS — BP 118/88 | HR 101 | Temp 98.1°F | Resp 18 | Ht 66.0 in | Wt 148.0 lb

## 2023-02-09 DIAGNOSIS — H353211 Exudative age-related macular degeneration, right eye, with active choroidal neovascularization: Secondary | ICD-10-CM

## 2023-02-09 DIAGNOSIS — R632 Polyphagia: Secondary | ICD-10-CM

## 2023-02-09 DIAGNOSIS — I10 Essential (primary) hypertension: Secondary | ICD-10-CM | POA: Diagnosis not present

## 2023-02-09 DIAGNOSIS — C61 Malignant neoplasm of prostate: Secondary | ICD-10-CM

## 2023-02-09 DIAGNOSIS — N400 Enlarged prostate without lower urinary tract symptoms: Secondary | ICD-10-CM | POA: Diagnosis not present

## 2023-02-09 DIAGNOSIS — I480 Paroxysmal atrial fibrillation: Secondary | ICD-10-CM | POA: Diagnosis not present

## 2023-02-09 DIAGNOSIS — L03116 Cellulitis of left lower limb: Secondary | ICD-10-CM | POA: Diagnosis not present

## 2023-02-09 DIAGNOSIS — E785 Hyperlipidemia, unspecified: Secondary | ICD-10-CM | POA: Diagnosis not present

## 2023-02-09 DIAGNOSIS — I5032 Chronic diastolic (congestive) heart failure: Secondary | ICD-10-CM | POA: Diagnosis not present

## 2023-02-09 DIAGNOSIS — H3530 Unspecified macular degeneration: Secondary | ICD-10-CM | POA: Diagnosis not present

## 2023-02-09 NOTE — Assessment & Plan Note (Signed)
Per opth 

## 2023-02-09 NOTE — Progress Notes (Signed)
Subjective:   By signing my name below, I, Mark Stark, attest that this documentation has been prepared under the direction and in the presence of [name], DO 02/09/23   Patient ID: Mark Stark., male    DOB: Sep 23, 1928, 89 y.o.   MRN: [id number]  Chief Complaint  Patient presents with   Cellulitis   Follow-up    HPI Patient is in today for a 6 month follow up. He is overall feeling well.   He reports that the infection on his lower left leg has not returned and the discoloration has improved. He has a scab on the lower left leg and notes that he got it while pulling [place] off of a tree in his yard.   He has been doing aerobics at home. He walks as fast as he can in the room. He has been doing exercise on the ground and is still able to get back up.   He is following up with his cardiologist, urologist, and dermatologist.  He is also following up with an eye doctor for glaucoma. He reports having a hemorrhage in his eye recently and notes that the residue has since cleared up.   He reports that he occasionally will get very hungry after eating dinner. He tends to eat dinner relatively late.   He reports that the LE peripheral edema has resolved. He is compliant with his medication.   He recently sold his home where he and his wife lived before she passed.   Past Medical History:  Diagnosis Date   Acute diastolic CHF (congestive heart failure) (HCC) 10/25/2021   Anxiety    Aortic insufficiency    Atrial fibrillation (HCC)    Chronic diastolic CHF (congestive heart failure) (HCC)    Chronic kidney disease, stage 3a (HCC)    Diverticulitis    Dyslipidemia    Hypertension    Inferior mesenteric vein thrombosis (HCC)    Mitral regurgitation    Nonrheumatic mitral valve regurgitation 10/28/2021   Paroxysmal atrial fibrillation ([place]) 01/12/2018   Persistent atrial fibrillation ([place])    Prostate cancer [facility])     Past Surgical History:  Procedure  Laterality Date   CARDIOVERSION N/A 03/01/2018   Procedure: CARDIOVERSION;  Surgeon: Troy Sine, MD;  Location: [place];  Service: Cardiovascular;  Laterality: N/A;   CARDIOVERSION N/A 08/26/2021   Procedure: CARDIOVERSION;  Surgeon: Pixie Casino, MD;  Location[facility] ENDOSCOPY;  Service: Cardiovascular;  Laterality: N/A;   CARDIOVERSION N/A 12/06/2021   Procedure: CARDIOVERSION;  Surgeon: Donato Heinz, MD;  Location: [place];  Service: Cardiovascular;  Laterality: N/A;   CATARACT EXTRACTION  01/27/2010   right   CATARACT EXTRACTION  03/04/2010   left   Prostate seed implants     PROSTATE SURGERY      Family History  Problem Relation Age of Onset   Dementia Mother    Alzheimer's disease Mother    Mental illness Mother        ALZ DISEASE   Cancer Brother 59       pancreatic cancer    Social History   Socioeconomic History   Marital status: Widowed    Spouse name: Not on file   Number of children: Not on file   Years of education: Not on file   Highest education level: Not on file  Occupational History   Occupation: Professor---RETIRED    Employer: RETIRED    Comment: Retired  Tobacco Use  Smoking status: Never   Smokeless tobacco: Never  Vaping Use   Vaping Use: Never used  Substance and Sexual Activity   Alcohol use: No    Comment: Little wine occasionally    Drug use: No   Sexual activity: Never    Partners: Female  Other Topics Concern   Not on file  Social History Narrative   Not on file   Social Determinants of Health   Financial Resource Strain: Low Risk  (02/17/2022)   Overall Financial Resource Strain (CARDIA)    Difficulty of Paying Living Expenses: Not hard at all  Food Insecurity: No Food Insecurity (08/29/2022)   Hunger Vital Sign    Worried About Running Out of Food in the Last Year: Never true    Ran Out of Food in the Last Year: Never true  Transportation Needs: No Transportation Needs (08/29/2022)   PRAPARE -  Hydrologist (Medical): No    Lack of Transportation (Non-Medical): No  Physical Activity: Insufficiently Active (01/24/2023)   Exercise Vital Sign    Days of Exercise per Week: 3 days    Minutes of Exercise per Session: 10 min  Stress: Stress Concern Present (01/24/2023)   [place]    Feeling of Stress : To some extent  Social Connections: Moderately Integrated (09/26/2022)   Social Connection and Isolation Panel [NHANES]    Frequency of Communication with Friends and Family: Twice a week    Frequency of Social Gatherings with Friends and Family: Twice a week    Attends Religious Services: More than 4 times per year    Active Member of [employer] or Organizations: Yes    Attends Archivist Meetings: Never    Marital Status: Widowed  Recent Concern: Social Connections - Moderately Isolated (08/29/2022)   Social Connection and Isolation Panel [NHANES]    Frequency of Communication with Friends and Family: Three times a week    Frequency of Social Gatherings with Friends and Family: Twice a week    Attends Religious Services: More than 4 times per year    Active Member of [employer] or Organizations: No    Attends Archivist Meetings: Never    Marital Status: Widowed  Intimate Partner Violence: Not At Risk (02/17/2022)   Humiliation, Afraid, Rape, and Kick questionnaire    Fear of Current or Ex-Partner: No    Emotionally Abused: No    Physically Abused: No    Sexually Abused: No    Outpatient Medications Prior to Visit  Medication Sig Dispense Refill   apixaban (ELIQUIS) 5 MG TABS tablet Take 1 tablet (5 mg total) by mouth 2 (two) times daily. 180 tablet 1   b complex vitamins tablet Take 1 tablet by mouth daily.     Calcium Carbonate-Vitamin D 600-400 MG-UNIT tablet Take 1 tablet by mouth daily.     clorazepate (TRANXENE) 15 MG tablet TAKE 1 TABLET BY MOUTH EVERYDAY AT BEDTIME 30  tablet 1   cromolyn (NASALCROM) 5.2 MG/ACT nasal spray Place 2 sprays into the nose 2 (two) times daily.     fenofibrate 160 MG tablet TAKE 1 TABLET BY MOUTH EVERY DAY 90 tablet 1   fish oil-omega-3 fatty acids 1000 MG capsule Take 1 g by mouth 3 (three) times daily.     latanoprost (XALATAN) 0.005 % ophthalmic solution Place 1 drop into the left eye at bedtime.     metoprolol succinate (TOPROL XL) 25 MG  24 hr tablet Take 0.5 tablets (12.5 mg total) by mouth daily. 45 tablet 3   Multiple Vitamins-Minerals (MULTIVITAMIN PO) Take 1 tablet by mouth daily.     Multiple Vitamins-Minerals (PRESERVISION AREDS 2) CAPS 2 po qd (Patient taking differently: Take 1 tablet by mouth in the morning and at bedtime.)     potassium chloride (KLOR-CON) 10 MEQ tablet TAKE 1 TABLET BY MOUTH EVERY DAY 90 tablet 1   simvastatin (ZOCOR) 10 MG tablet TAKE 1 TABLET BY MOUTH EVERYDAY AT BEDTIME 90 tablet 2   tamsulosin (FLOMAX) 0.4 MG CAPS capsule Take 0.4 mg by mouth at bedtime.     torsemide (DEMADEX) 20 MG tablet Take 1 tablet (20 mg total) by mouth daily. 90 tablet 1   vitamin C (ASCORBIC ACID) 500 MG tablet Take 500 mg by mouth daily.     doxycycline (VIBRA-TABS) 100 MG tablet Take 1 tablet (100 mg total) by mouth 2 (two) times daily. (Patient not taking: Reported on 02/09/2023) 20 tablet 0   No facility-administered medications prior to visit.    Allergies  Allergen Reactions   Sulfonamide Derivatives Other (See Comments)    Unknown - patient cannot remember reaction    Review of Systems  Constitutional:  Negative for fever and malaise/fatigue.  HENT:  Negative for congestion.   Eyes:  Negative for blurred vision.  Respiratory:  Negative for shortness of breath.   Cardiovascular:  Negative for chest pain, palpitations and leg swelling.  Gastrointestinal:  Negative for abdominal pain, blood in stool and nausea.  Genitourinary:  Negative for dysuria and frequency.  Musculoskeletal:  Negative for falls.   Skin:  Negative for itching and rash.  Neurological:  Negative for dizziness, loss of consciousness and headaches.  Endo/Heme/Allergies:  Negative for environmental allergies.  Psychiatric/Behavioral:  Negative for depression. The patient is not nervous/anxious.        Objective:    Physical Exam Vitals and nursing note reviewed.  Constitutional:      Appearance: He is well-developed.  HENT:     Head: Normocephalic and atraumatic.  Eyes:     Pupils: Pupils are equal, round, and reactive to light.  Neck:     Thyroid: No thyromegaly.  Cardiovascular:     Rate and Rhythm: Normal rate. Rhythm irregularly irregular.     Heart sounds: No murmur heard.    Comments: Irregularly irregular. A-fib Pulmonary:     Effort: Pulmonary effort is normal. No respiratory distress.     Breath sounds: Normal breath sounds. No wheezing or rales.  Chest:     Chest wall: No tenderness.  Musculoskeletal:        General: No swelling or tenderness.     Cervical back: Normal range of motion and neck supple.     Right hip: Normal range of motion. Normal strength.     Left hip: Normal range of motion. Normal strength.     Right lower leg: No edema.     Left lower leg: No edema.     Right foot: Bony tenderness present. No swelling.     Left foot: Bony tenderness present. No swelling.  Skin:    General: Skin is warm and dry.  Neurological:     Mental Status: He is alert and oriented to person, place, and time.  Psychiatric:        Behavior: Behavior normal.        Thought Content: Thought content normal.        Judgment: Judgment normal.  BP 118/88 (BP Location: Left Arm, Patient Position: Sitting, Cuff Size: Normal)   Pulse (!) 101   Temp 98.1 F (36.7 C) (Oral)   Resp 18   Ht 5' 6"$  (1.676 m)   Wt 148 lb (67.1 kg)   SpO2 97%   BMI 23.89 kg/m  Wt Readings from Last 3 Encounters:  02/09/23 148 lb (67.1 kg)  01/05/23 145 lb (65.8 kg)  12/29/22 145 lb (65.8 kg)       Assessment &  Plan:  Essential hypertension Assessment & Plan: Well controlled, no changes to meds. Encouraged heart healthy diet such as the DASH diet and exercise as tolerated.    Orders: -     Comprehensive metabolic panel -     Lipid panel  Cellulitis of left lower extremity -     Comprehensive metabolic panel -     Lipid panel  Hyperlipidemia, unspecified hyperlipidemia type Assessment & Plan: Encourage heart healthy diet such as MIND or DASH diet, increase exercise, avoid trans fats, simple carbohydrates and processed foods, consider a krill or fish or flaxseed oil cap daily.    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  All medical record entries made by the scribe were at my direction and in my presence.  I have reviewed the chart and discharge instructions (if applicable) and agree that the record reflects my personal performance and is accurate and complete. 02/09/23   [name], DO

## 2023-02-09 NOTE — Assessment & Plan Note (Signed)
Per oprh

## 2023-02-09 NOTE — Assessment & Plan Note (Signed)
Per u rology 

## 2023-02-09 NOTE — Assessment & Plan Note (Signed)
Encourage heart healthy diet such as MIND or DASH diet, increase exercise, avoid trans fats, simple carbohydrates and processed foods, consider a krill or fish or flaxseed oil cap daily.  °

## 2023-02-09 NOTE — Patient Instructions (Signed)

## 2023-02-09 NOTE — Assessment & Plan Note (Addendum)
Per cardiology 

## 2023-02-09 NOTE — Assessment & Plan Note (Signed)
Per cardiology 

## 2023-02-09 NOTE — Assessment & Plan Note (Signed)
Well controlled, no changes to meds. Encouraged heart healthy diet such as the DASH diet and exercise as tolerated.  

## 2023-02-10 LAB — COMPREHENSIVE METABOLIC PANEL
AG Ratio: 1.5 (calc) (ref 1.0–2.5)
ALT: 17 U/L (ref 9–46)
AST: 30 U/L (ref 10–35)
Albumin: 4 g/dL (ref 3.6–5.1)
Alkaline phosphatase (APISO): 46 U/L (ref 35–144)
BUN/Creatinine Ratio: 20 (calc) (ref 6–22)
BUN: 28 mg/dL — ABNORMAL HIGH (ref 7–25)
CO2: 26 mmol/L (ref 20–32)
Calcium: 9.5 mg/dL (ref 8.6–10.3)
Chloride: 99 mmol/L (ref 98–110)
Creat: 1.38 mg/dL — ABNORMAL HIGH (ref 0.70–1.22)
Globulin: 2.7 g/dL (calc) (ref 1.9–3.7)
Glucose, Bld: 87 mg/dL (ref 65–99)
Potassium: 5 mmol/L (ref 3.5–5.3)
Sodium: 137 mmol/L (ref 135–146)
Total Bilirubin: 0.9 mg/dL (ref 0.2–1.2)
Total Protein: 6.7 g/dL (ref 6.1–8.1)

## 2023-02-10 LAB — LIPID PANEL
Cholesterol: 123 mg/dL (ref <200)
HDL: 48 mg/dL (ref >=40)
LDL Cholesterol (Calc): 57 mg/dL (calc)
Non-HDL Cholesterol (Calc): 75 mg/dL (calc) (ref <130)
Total CHOL/HDL Ratio: 2.6 (calc) (ref <5.0)
Triglycerides: 93 mg/dL (ref <150)

## 2023-02-10 LAB — TSH: TSH: 2.4 mIU/L (ref 0.40–4.50)

## 2023-02-14 ENCOUNTER — Other Ambulatory Visit: Payer: Self-pay | Admitting: Family Medicine

## 2023-02-14 ENCOUNTER — Ambulatory Visit (HOSPITAL_BASED_OUTPATIENT_CLINIC_OR_DEPARTMENT_OTHER): Payer: Medicare Other | Admitting: Cardiology

## 2023-02-14 DIAGNOSIS — I10 Essential (primary) hypertension: Secondary | ICD-10-CM

## 2023-02-19 ENCOUNTER — Ambulatory Visit (INDEPENDENT_AMBULATORY_CARE_PROVIDER_SITE_OTHER): Payer: Medicare Other | Admitting: *Deleted

## 2023-02-19 VITALS — BP 125/86 | HR 123 | Ht 66.0 in | Wt 142.0 lb

## 2023-02-19 DIAGNOSIS — Z0000 Encounter for general adult medical examination without abnormal findings: Secondary | ICD-10-CM | POA: Diagnosis not present

## 2023-02-19 NOTE — Patient Instructions (Signed)
Mark Stark , Thank you for taking time to come for your Medicare Wellness Visit. I appreciate your ongoing commitment to your health goals. Please review the following plan we discussed and let me know if I can assist you in the future.    This is a list of the screening recommended for you and due dates:  Health Maintenance  Topic Date Due   COVID-19 Vaccine (8 - 2023-24 season) 11/23/2022   Medicare Annual Wellness Visit  02/20/2024   DTaP/Tdap/Td vaccine (3 - Td or Tdap) 12/01/2032   Pneumonia Vaccine  Completed   Flu Shot  Completed   Zoster (Shingles) Vaccine  Completed   HPV Vaccine  Aged Out     Next appointment: Follow up in one year for your annual wellness visit.   Preventive Care 87 Years and Older, Male Preventive care refers to lifestyle choices and visits with your health care provider that can promote health and wellness. What does preventive care include? A yearly physical exam. This is also called an annual well check. Dental exams once or twice a year. Routine eye exams. Ask your health care provider how often you should have your eyes checked. Personal lifestyle choices, including: Daily care of your teeth and gums. Regular physical activity. Eating a healthy diet. Avoiding tobacco and drug use. Limiting alcohol use. Practicing safe sex. Taking low doses of aspirin every day. Taking vitamin and mineral supplements as recommended by your health care provider. What happens during an annual well check? The services and screenings done by your health care provider during your annual well check will depend on your age, overall health, lifestyle risk factors, and family history of disease. Counseling  Your health care provider may ask you questions about your: Alcohol use. Tobacco use. Drug use. Emotional well-being. Home and relationship well-being. Sexual activity. Eating habits. History of falls. Memory and ability to understand (cognition). Work and work  Statistician. Screening  You may have the following tests or measurements: Height, weight, and BMI. Blood pressure. Lipid and cholesterol levels. These may be checked every 5 years, or more frequently if you are over 66 years old. Skin check. Lung cancer screening. You may have this screening every year starting at age 12 if you have a 30-pack-year history of smoking and currently smoke or have quit within the past 15 years. Fecal occult blood test (FOBT) of the stool. You may have this test every year starting at age 64. Flexible sigmoidoscopy or colonoscopy. You may have a sigmoidoscopy every 5 years or a colonoscopy every 10 years starting at age 87. Prostate cancer screening. Recommendations will vary depending on your family history and other risks. Hepatitis C blood test. Hepatitis B blood test. Sexually transmitted disease (STD) testing. Diabetes screening. This is done by checking your blood sugar (glucose) after you have not eaten for a while (fasting). You may have this done every 1-3 years. Abdominal aortic aneurysm (AAA) screening. You may need this if you are a current or former smoker. Osteoporosis. You may be screened starting at age 74 if you are at high risk. Talk with your health care provider about your test results, treatment options, and if necessary, the need for more tests. Vaccines  Your health care provider may recommend certain vaccines, such as: Influenza vaccine. This is recommended every year. Tetanus, diphtheria, and acellular pertussis (Tdap, Td) vaccine. You may need a Td booster every 10 years. Zoster vaccine. You may need this after age 47. Pneumococcal 13-valent conjugate (PCV13) vaccine.  One dose is recommended after age 101. Pneumococcal polysaccharide (PPSV23) vaccine. One dose is recommended after age 45. Talk to your health care provider about which screenings and vaccines you need and how often you need them. This information is not intended to replace  advice given to you by your health care provider. Make sure you discuss any questions you have with your health care provider. Document Released: 01/07/2016 Document Revised: 08/30/2016 Document Reviewed: 10/12/2015 Elsevier Interactive Patient Education  2017 [place] Prevention in the Home Falls can cause injuries. They can happen to people of all ages. There are many things you can do to make your home safe and to help prevent falls. What can I do on the outside of my home? Regularly fix the edges of walkways and driveways and fix any cracks. Remove anything that might make you trip as you walk through a door, such as a raised step or threshold. Trim any bushes or trees on the path to your home. Use bright outdoor lighting. Clear any walking paths of anything that might make someone trip, such as rocks or tools. Regularly check to see if handrails are loose or broken. Make sure that both sides of any steps have handrails. Any raised decks and porches should have guardrails on the edges. Have any leaves, snow, or ice cleared regularly. Use sand or salt on walking paths during winter. Clean up any spills in your garage right away. This includes oil or grease spills. What can I do in the bathroom? Use night lights. Install grab bars by the toilet and in the tub and shower. Do not use towel bars as grab bars. Use non-skid mats or decals in the tub or shower. If you need to sit down in the shower, use a plastic, non-slip stool. Keep the floor dry. Clean up any water that spills on the floor as soon as it happens. Remove soap buildup in the tub or shower regularly. Attach bath mats securely with double-sided non-slip rug tape. Do not have throw rugs and other things on the floor that can make you trip. What can I do in the bedroom? Use night lights. Make sure that you have a light by your bed that is easy to reach. Do not use any sheets or blankets that are too big for your bed.  They should not hang down onto the floor. Have a firm chair that has side arms. You can use this for support while you get dressed. Do not have throw rugs and other things on the floor that can make you trip. What can I do in the kitchen? Clean up any spills right away. Avoid walking on wet floors. Keep items that you use a lot in easy-to-reach places. If you need to reach something above you, use a strong step stool that has a grab bar. Keep electrical cords out of the way. Do not use floor polish or wax that makes floors slippery. If you must use wax, use non-skid floor wax. Do not have throw rugs and other things on the floor that can make you trip. What can I do with my stairs? Do not leave any items on the stairs. Make sure that there are handrails on both sides of the stairs and use them. Fix handrails that are broken or loose. Make sure that handrails are as long as the stairways. Check any carpeting to make sure that it is firmly attached to the stairs. Fix any carpet that is loose or  worn. Avoid having throw rugs at the top or bottom of the stairs. If you do have throw rugs, attach them to the floor with carpet tape. Make sure that you have a light switch at the top of the stairs and the bottom of the stairs. If you do not have them, ask someone to add them for you. What else can I do to help prevent falls? Wear shoes that: Do not have high heels. Have rubber bottoms. Are comfortable and fit you well. Are closed at the toe. Do not wear sandals. If you use a stepladder: Make sure that it is fully opened. Do not climb a closed stepladder. Make sure that both sides of the stepladder are locked into place. Ask someone to hold it for you, if possible. Clearly mark and make sure that you can see: Any grab bars or handrails. First and last steps. Where the edge of each step is. Use tools that help you move around (mobility aids) if they are needed. These  include: Canes. Walkers. Scooters. Crutches. Turn on the lights when you go into a dark area. Replace any light bulbs as soon as they burn out. Set up your furniture so you have a clear path. Avoid moving your furniture around. If any of your floors are uneven, fix them. If there are any pets around you, be aware of where they are. Review your medicines with your doctor. Some medicines can make you feel dizzy. This can increase your chance of falling. Ask your doctor what other things that you can do to help prevent falls. This information is not intended to replace advice given to you by your health care provider. Make sure you discuss any questions you have with your health care provider. Document Released: 10/07/2009 Document Revised: 05/18/2016 Document Reviewed: 01/15/2015 Elsevier Interactive Patient Education  2017 [employer].

## 2023-02-19 NOTE — Progress Notes (Signed)
Subjective:   Mark Stark. is a 89 y.o. male who presents for Medicare Annual/Subsequent preventive examination.  Review of Systems    Defer to PCP Cardiac Risk Factors include: advanced age (>17mn, >>75women);male gender;dyslipidemia;hypertension     Objective:    Today's Vitals   02/19/23 1301  BP: 125/86  Pulse: (!) 123  SpO2: 98%  Weight: 142 lb (64.4 kg)  Height: '5\' 6"'$  (1.676 m)   Body mass index is 22.92 kg/m.     02/19/2023    1:01 PM 11/24/2022   11:18 AM 08/23/2022    2:00 PM 05/11/2022   11:01 AM 02/17/2022    1:01 PM 01/06/2022    1:16 PM 12/06/2021    6:54 AM  Advanced Directives  Does Patient Have a Medical Advance Directive? Yes No Yes Yes Yes Yes Yes  Type of AParamedicof ATregoLiving will  Living will [place] will [place] will [place] will Living will;Healthcare Power of Attorney  Does patient want to make changes to medical advance directive? No - Patient declined  No - Patient declined No - Patient declined  No - Patient declined   Copy of [place] Chart? Yes - validated most recent copy scanned in chart (See row information)   No - copy requested Yes - validated most recent copy scanned in chart (See row information)      Current Medications (verified) Outpatient Encounter Medications as of 02/19/2023  Medication Sig   apixaban (ELIQUIS) 5 MG TABS tablet Take 1 tablet (5 mg total) by mouth 2 (two) times daily.   b complex vitamins tablet Take 1 tablet by mouth daily.   Calcium Carbonate-Vitamin D 600-400 MG-UNIT tablet Take 1 tablet by mouth daily.   clorazepate (TRANXENE) 15 MG tablet TAKE 1 TABLET BY MOUTH EVERYDAY AT BEDTIME   cromolyn (NASALCROM) 5.2 MG/ACT nasal spray Place 2 sprays into the nose 2 (two) times daily.   fenofibrate 160 MG tablet TAKE 1 TABLET BY MOUTH EVERY DAY   fish oil-omega-3 fatty acids 1000  MG capsule Take 1 g by mouth 3 (three) times daily.   latanoprost (XALATAN) 0.005 % ophthalmic solution Place 1 drop into the left eye at bedtime.   metoprolol succinate (TOPROL XL) 25 MG 24 hr tablet Take 0.5 tablets (12.5 mg total) by mouth daily.   Multiple Vitamins-Minerals (MULTIVITAMIN PO) Take 1 tablet by mouth daily.   Multiple Vitamins-Minerals (PRESERVISION AREDS 2) CAPS 2 po qd (Patient taking differently: Take 1 tablet by mouth in the morning and at bedtime.)   potassium chloride (KLOR-CON) 10 MEQ tablet TAKE 1 TABLET BY MOUTH EVERY DAY   simvastatin (ZOCOR) 10 MG tablet TAKE 1 TABLET BY MOUTH EVERYDAY AT BEDTIME   tamsulosin (FLOMAX) 0.4 MG CAPS capsule Take 0.4 mg by mouth at bedtime.   torsemide (DEMADEX) 20 MG tablet Take 1 tablet (20 mg total) by mouth daily.   vitamin C (ASCORBIC ACID) 500 MG tablet Take 500 mg by mouth daily.   No facility-administered encounter medications on file as of 02/19/2023.    Allergies (verified) Sulfonamide derivatives   History: Past Medical History:  Diagnosis Date   Acute diastolic CHF (congestive heart failure) (HCC) 10/25/2021   Anxiety    Aortic insufficiency    Atrial fibrillation (HCC)    Chronic diastolic CHF (congestive heart failure) (HCC)    Chronic kidney disease, stage 3a (HCC)    Diverticulitis  Dyslipidemia    Hypertension    Inferior mesenteric vein thrombosis (HCC)    Mitral regurgitation    Nonrheumatic mitral valve regurgitation 10/28/2021   Paroxysmal atrial fibrillation ([place]) 01/12/2018   Persistent atrial fibrillation ([place])    Prostate cancer [facility])    Past Surgical History:  Procedure Laterality Date   CARDIOVERSION N/A 03/01/2018   Procedure: CARDIOVERSION;  Surgeon: Troy Sine, MD;  Location: [facility] ENDOSCOPY;  Service: Cardiovascular;  Laterality: N/A;   CARDIOVERSION N/A 08/26/2021   Procedure: CARDIOVERSION;  Surgeon: Pixie Casino, MD;  Location: [facility] ENDOSCOPY;  Service: Cardiovascular;  Laterality: N/A;    CARDIOVERSION N/A 12/06/2021   Procedure: CARDIOVERSION;  Surgeon: Donato Heinz, MD;  Location: [facility] ENDOSCOPY;  Service: Cardiovascular;  Laterality: N/A;   CATARACT EXTRACTION  01/27/2010   right   CATARACT EXTRACTION  03/04/2010   left   Prostate seed implants     PROSTATE SURGERY     Family History  Problem Relation Age of Onset   Dementia Mother    Alzheimer's disease Mother    Mental illness Mother        ALZ DISEASE   Cancer Brother 31       pancreatic cancer   Social History   Socioeconomic History   Marital status: Widowed    Spouse name: Not on file   Number of children: Not on file   Years of education: Not on file   Highest education level: Not on file  Occupational History   Occupation: Professor---RETIRED    Employer: RETIRED    Comment: Retired  Tobacco Use   Smoking status: Never   Smokeless tobacco: Never  Vaping Use   Vaping Use: Never used  Substance and Sexual Activity   Alcohol use: No    Comment: Little wine occasionally    Drug use: No   Sexual activity: Never    Partners: Female  Other Topics Concern   Not on file  Social History Narrative   Not on file   Social Determinants of Health   Financial Resource Strain: Low Risk  (02/17/2022)   Overall Financial Resource Strain (CARDIA)    Difficulty of Paying Living Expenses: Not hard at all  Food Insecurity: No Food Insecurity (08/29/2022)   Hunger Vital Sign    Worried About Running Out of Food in the Last Year: Never true    [place] in the Last Year: Never true  Transportation Needs: No Transportation Needs (02/19/2023)   PRAPARE - Hydrologist (Medical): No    Lack of Transportation (Non-Medical): No  Physical Activity: Insufficiently Active (01/24/2023)   Exercise Vital Sign    Days of Exercise per Week: 3 days    Minutes of Exercise per Session: 10 min  Stress: Stress Concern Present (01/24/2023)   [place]    Feeling of Stress : To some extent  Social Connections: Moderately Integrated (09/26/2022)   Social Connection and Isolation Panel [NHANES]    Frequency of Communication with Friends and Family: Twice a week    Frequency of Social Gatherings with Friends and Family: Twice a week    Attends Religious Services: More than 4 times per year    Active Member of [employer] or Organizations: Yes    Attends Archivist Meetings: Never    Marital Status: Widowed  Recent Concern: Social Connections - Moderately Isolated (08/29/2022)   Social Connection and Isolation Panel [  NHANES]    Frequency of Communication with Friends and Family: Three times a week    Frequency of Social Gatherings with Friends and Family: Twice a week    Attends Religious Services: More than 4 times per year    Active Member of [employer] or Organizations: No    Attends Archivist Meetings: Never    Marital Status: Widowed    Tobacco Counseling Counseling given: Not Answered   Clinical Intake:  Pre-visit preparation completed: Yes   Diabetes: No  How often do you need to have someone help you when you read instructions, pamphlets, or other written materials from your doctor or pharmacy?: 1 - Never   Activities of Daily Living    02/19/2023    1:11 PM 08/23/2022    3:00 PM  In your present state of health, do you have any difficulty performing the following activities:  Hearing? 1 1  Comment wears hearing aids   Vision? 1   Difficulty concentrating or making decisions? 0   Walking or climbing stairs? 0   Dressing or bathing? 0   Doing errands, shopping? 0   Preparing Food and eating ? N   Using the Toilet? N   In the past six months, have you accidently leaked urine? N   Do you have problems with loss of bowel control? N   Managing your Medications? N   Managing your Finances? N   Housekeeping or managing your Housekeeping? N     Patient Care Team: [name], [name], DO as PCP - General Buford Dresser, MD as PCP - Cardiology (Cardiology) Gerarda Fraction, MD as Referring Physician (Ophthalmology) Druscilla Brownie, MD as Consulting Physician (Dermatology) Remi Haggard, MD as Consulting Physician (Urology) Remi Haggard, MD as Consulting Physician (Urology) Bond, Tracie Harrier, MD as Referring Physician (Ophthalmology)  Indicate any recent Medical Services you may have received from other than [facility] providers in the past year (date may be approximate).     Assessment:   This is a routine wellness examination for Mark Stark.  Hearing/Vision screen No results found.  Dietary issues and exercise activities discussed: Current Exercise Habits: Home exercise routine, Type of exercise: walking, Time (Minutes): 50, Frequency (Times/Week): 7, Weekly Exercise (Minutes/Week): 350, Intensity: Mild, Exercise limited by: None identified   Goals Addressed   None    Depression Screen    02/19/2023    1:11 PM 01/24/2023   11:51 AM 02/17/2022    1:07 PM 02/10/2021    9:51 AM 02/07/2021    1:29 PM 01/19/2020   11:00 AM 01/14/2019    1:29 PM  PHQ 2/9 Scores  PHQ - 2 Score 0 2 0 1 0 0 0  PHQ- 9 Score  4         Fall Risk    02/19/2023    1:06 PM 02/09/2023    1:25 PM 12/01/2022    2:42 PM 02/17/2022    1:03 PM 04/05/2021    1:17 PM  [place] in the past year? '1 1 1 '$ 0 0  Comment tripped over [place] in patio      Number falls in past yr: 0 0 0 0   Injury with Fall? 1 1 0 0   Risk for fall due to : History of fall(s) Impaired balance/gait;Impaired mobility Impaired balance/gait    Follow up Falls evaluation completed Falls evaluation completed Falls evaluation completed Falls prevention discussed     FALL RISK PREVENTION PERTAINING TO  THE HOME:  Any stairs in or around the home? Yes  If so, are there any without handrails? No  Home free of loose throw rugs in walkways, pet beds, electrical cords, etc? Yes  Adequate  lighting in your home to reduce risk of falls? Yes   ASSISTIVE DEVICES UTILIZED TO PREVENT FALLS:  Life alert? No  Use of a cane, walker or w/c? No  Grab bars in the bathroom? Yes  Shower chair or bench in shower? No  Elevated toilet seat or a handicapped toilet? No   TIMED UP AND GO:  Was the test performed? Yes .  Length of time to ambulate 10 feet: 10 sec.   Gait slow and steady without use of assistive device  Cognitive Function:    01/14/2019    1:33 PM 12/22/2016    2:05 PM  MMSE - Mini Mental State Exam  Orientation to time 5 5  Orientation to Place 5 5  Registration 3 3  Attention/ Calculation 5 5  Recall 3 3  Language- name 2 objects 2 2  Language- repeat 1 1  Language- follow 3 step command 3 3  Language- read & follow direction 1 1  Write a sentence 1 1  Copy design 1 1  Total score 30 30        02/19/2023    1:28 PM  6CIT Screen  What Year? 0 points  What month? 0 points  What time? 0 points  Count back from 20 0 points  Months in reverse 0 points  Repeat phrase 4 points  Total Score 4 points    Immunizations Immunization History  Administered Date(s) Administered   Influenza Whole 11/14/2007, 09/30/2008, 09/08/2009, 08/31/2010, 09/03/2013   Influenza, High Dose Seasonal PF 08/19/2015, 09/07/2017, 08/05/2019, 09/28/2022   Influenza-Unspecified 09/07/2014, 09/15/2016, 08/03/2020   PFIZER Comirnaty(Gray Top)Covid-19 Tri-Sucrose Vaccine 04/11/2021   PFIZER(Purple Top)SARS-COV-2 Vaccination 01/18/2020, 02/09/2020, 09/14/2020   Pfizer Covid-19 Vaccine Bivalent Booster 49yr & up 10/04/2021, 05/15/2022   Pneumococcal Conjugate-13 09/07/2014   Pneumococcal Polysaccharide-23 09/22/2009   Td 12/01/2022   Tdap 12/25/2012   Unspecified SARS-COV-2 Vaccination 09/28/2022   Zoster Recombinat (Shingrix) 02/17/2022, 05/15/2022   Zoster, Live 03/19/2008    TDAP status: Up to date  Flu Vaccine status: Up to date  Pneumococcal vaccine status: Up to  date  Covid-19 vaccine status: Information provided on how to obtain vaccines.   Qualifies for Shingles Vaccine? Yes   Zostavax completed Yes   Shingrix Completed?: Yes  Screening Tests Health Maintenance  Topic Date Due   COVID-19 Vaccine (8 - 2023-24 season) 11/23/2022   Medicare Annual Wellness (AWV)  02/17/2023   DTaP/Tdap/Td (3 - Td or Tdap) 12/01/2032   Pneumonia Vaccine 89 Years old  Completed   INFLUENZA VACCINE  Completed   Zoster Vaccines- Shingrix  Completed   HPV VACCINES  Aged Out    Health Maintenance  Health Maintenance Due  Topic Date Due   COVID-19 Vaccine (8 - 2023-24 season) 11/23/2022   Medicare Annual Wellness (AWV)  02/17/2023    Colorectal cancer screening: No longer required.   Lung Cancer Screening: (Low Dose CT Chest recommended if Age 87-80years, 30 pack-year currently smoking OR have quit w/in 15years.) does not qualify.   Additional Screening:  Hepatitis C Screening: does not qualify  Vision Screening: Recommended annual ophthalmology exams for early detection of glaucoma and other disorders of the eye. Is the patient up to date with their annual eye exam?  Yes  Who is  the provider or what is the name of the office in which the patient attends annual eye exams? Dr. Ellie Lunch If pt is not established with a provider, would they like to be referred to a provider to establish care? No .   Dental Screening: Recommended annual dental exams for proper oral hygiene  Community Resource Referral / Chronic Care Management: CRR required this visit?  No   CCM required this visit?  No      Plan:     I have personally reviewed and noted the following in the patient's chart:   Medical and social history Use of alcohol, tobacco or illicit drugs  Current medications and supplements including opioid prescriptions. Patient is not currently taking opioid prescriptions. Functional ability and status Nutritional status Physical activity Advanced  directives List of other physicians Hospitalizations, surgeries, and ER visits in previous 12 months Vitals Screenings to include cognitive, depression, and falls Referrals and appointments  In addition, I have reviewed and discussed with patient certain preventive protocols, quality metrics, and best practice recommendations. A written personalized care plan for preventive services as well as general preventive health recommendations were provided to patient.     Beatris Ship, [place]   02/19/2023   Nurse Notes: None

## 2023-02-20 ENCOUNTER — Telehealth: Payer: Self-pay | Admitting: Cardiology

## 2023-02-20 NOTE — Telephone Encounter (Signed)
Returned call to patient and reviewed the following recommendations with patient who verbalizes understanding!     "Recent labs by PCP reveal kidney function overall stable over the last 5 months. May continue Torsemide '20mg'$  QD. He may drink up to 2L (64 oz) of fluid per day, but would not exceed this amount of fluid. PCP has ordered repeat labs in a few week which is a reasonable plan.    Loel Dubonnet, NP"

## 2023-02-20 NOTE — Telephone Encounter (Signed)
Pt c/o medication issue:  1. Name of Medication:   torsemide (DEMADEX) 20 MG tablet  2. How are you currently taking this medication (dosage and times per day)?   3. Are you having a reaction (difficulty breathing--STAT)?   4. What is your medication issue?   Patient states he recently had labs with PCP and kidney function was elevated. He states he has been given conflict advise regarding fluid intake and taking Torsemide. Patient would like a call back to discuss.

## 2023-02-20 NOTE — Telephone Encounter (Signed)
Recent labs by PCP reveal kidney function overall stable over the last 5 months. May continue Torsemide '20mg'$  QD. He may drink up to 2L (64 oz) of fluid per day, but would not exceed this amount of fluid. PCP has ordered repeat labs in a few week which is a reasonable plan.   Loel Dubonnet, NP

## 2023-02-22 DIAGNOSIS — H353231 Exudative age-related macular degeneration, bilateral, with active choroidal neovascularization: Secondary | ICD-10-CM | POA: Diagnosis not present

## 2023-02-22 DIAGNOSIS — H35373 Puckering of macula, bilateral: Secondary | ICD-10-CM | POA: Diagnosis not present

## 2023-02-22 DIAGNOSIS — H43813 Vitreous degeneration, bilateral: Secondary | ICD-10-CM | POA: Diagnosis not present

## 2023-02-22 DIAGNOSIS — H401122 Primary open-angle glaucoma, left eye, moderate stage: Secondary | ICD-10-CM | POA: Diagnosis not present

## 2023-02-27 ENCOUNTER — Ambulatory Visit: Payer: Self-pay | Admitting: Licensed Clinical Social Worker

## 2023-02-27 NOTE — Patient Instructions (Signed)
Visit Information  Thank you for taking time to visit with me today. Please don't hesitate to contact me if I can be of assistance to you before our next scheduled telephone appointment.  Following are the goals we discussed today:    Our next appointment is by telephone on 04/03/23 at 1:00 PM   Please call the care guide team at [phone] if you need to cancel or reschedule your appointment.   If you are experiencing a Mental Health or [place] or need someone to talk to, please go to [facility] [place] [phone])   Following is a copy of your full plan of care:   Care Coordination Interventions:  Client and LCSW spoke of client needs via phone today.  Client and LCSW spoke of care client received from cardiologist.   Client and LCSW spoke of client support with PCP, Dr. Roma Schanz Talked of walking of client. He said  he is walking better now.  He tries to walk daily for exercise and he feels that this daily walking has been helpful to client.  Reviewed medication procurement Discussed transport needs of client. He said he  is driving his car on short trips as needed. He goes to grocery store, medical appointments, pharmacy.  Discussed socialization of client. He said he has friends with whom he socializes. He likes to have meals with friends and enjoys socializing with his many friends Reviewed pain issues of client.   Discussed edema issues. He said his edema issues have improved recently Discussed activities of client. He enjoys working in his yard and in his garden Discussed of family support During conversation client had hearing issues. LCSW had to speak in loud volume for client to hear. Sometimes LCSW had to repeat questions during phone visit in order for client to hear questions. Client was very communicative and friendly during call and was appreciative of call from LCSW LCSW thanked  client for phone call and encouraged client to call SW as needed for SW support at [phone].   Mr. Heitzman was given information about Care Management services by the embedded care coordination team including:  Care Management services include personalized support from designated clinical staff supervised by his physician, including individualized plan of care and coordination with other care providers 24/7 contact phone numbers for assistance for urgent and routine care needs. The patient may stop CCM services at any time (effective at the end of the month) by phone call to the office staff.  Patient agreed to services and verbal consent obtained.   [name].Tyishia Aune MSW, [place] representative [facility] Care Management [phone]

## 2023-02-27 NOTE — Patient Outreach (Signed)
  Care Coordination   Follow Up Visit Note   02/27/2023 Name: Mark Stark. MRN: [id number] DOB: 1928-05-27  Mark Stark. is a 89 y.o. year old male who sees [name], [name], DO for primary care. I spoke with  Mark Stark. by phone today.  What matters to the patients health and wellness today?  Patient wants to continue to complete  daily ADLs as able. Client wants to socialize with friends as he is able.    Goals Addressed               This Visit's Progress     Patient wants to continue to complete daily ADLs as able.  Client wants to socialize with friends as he is able. (pt-stated)        Care Coordination Interventions:  Client and LCSW spoke of client needs via phone today.  Client and LCSW spoke of care client received from cardiologist.   Client and LCSW spoke of client support with PCP, Dr. Roma Schanz Talked of walking of client. He said  he is walking better now.  He tries to walk daily for exercise and he feels that this daily walking has been helpful to client.  Reviewed medication procurement Discussed transport needs of client. He said he  is driving his car on short trips as needed. He goes to grocery store, medical appointments, pharmacy.  Discussed socialization of client. He said he has friends with whom he socializes. He likes to have meals with friends and enjoys socializing with his many friends Reviewed pain issues of client.   Discussed edema issues. He said his edema issues have improved recently Discussed activities of client. He enjoys working in his yard and in his garden Discussed of family support During conversation client had hearing issues. LCSW had to speak in loud volume for client to hear. Sometimes LCSW had to repeat questions during phone visit in order for client to hear questions. Client was very communicative and friendly during call and was appreciative of call from LCSW LCSW thanked client for phone call and encouraged  client to call SW as needed for SW support at [phone].       SDOH assessments and interventions completed:  Yes  SDOH Interventions Today    Flowsheet Row Most Recent Value  SDOH Interventions   Physical Activity Interventions Other (Comments)  [likes to walk for exercise]  Stress Interventions Provide Counseling  [may have some stress in managing medical needs]        Care Coordination Interventions:  Yes, provided   Interventions Today    Flowsheet Row Most Recent Value  Chronic Disease   Chronic disease during today's visit Other  [spoke with client about client needs]  General Interventions   General Interventions Discussed/Reviewed General Interventions Discussed, [facility] program support with RN, Pharmacist and LCSW]  Exercise Interventions   Exercise Discussed/Reviewed Physical Activity  [client likes to walk for exercise]  Physical Activity Discussed/Reviewed Physical Activity Reviewed  Education Interventions   Education Provided Provided Education  Provided Verbal Education On [place] Discussed/Reviewed Coping Strategies       Follow up plan: Follow up call scheduled for 04/03/23 at 1:00 PM     Encounter Outcome:  Pt. Visit Completed   [name].Shamecca Whitebread MSW, [place] representative [facility] Care Management [phone]

## 2023-03-02 ENCOUNTER — Other Ambulatory Visit: Payer: Self-pay | Admitting: Family Medicine

## 2023-03-02 DIAGNOSIS — F411 Generalized anxiety disorder: Secondary | ICD-10-CM

## 2023-03-02 NOTE — Telephone Encounter (Signed)
Requesting: clorazepate '15mg'$   Contract: 07/28/21 UDS: 02/10/22 Last Visit: 02/09/23 Next Visit: 08/10/23 Last Refill: 09/28/22 #30 and 1RF   Please Advise

## 2023-03-07 ENCOUNTER — Other Ambulatory Visit: Payer: Medicare Other

## 2023-03-12 ENCOUNTER — Other Ambulatory Visit (INDEPENDENT_AMBULATORY_CARE_PROVIDER_SITE_OTHER): Payer: Medicare Other

## 2023-03-12 DIAGNOSIS — D691 Qualitative platelet defects: Secondary | ICD-10-CM | POA: Diagnosis not present

## 2023-03-12 DIAGNOSIS — I10 Essential (primary) hypertension: Secondary | ICD-10-CM

## 2023-03-12 LAB — COMPREHENSIVE METABOLIC PANEL
ALT: 21 U/L (ref 0–53)
AST: 34 U/L (ref 0–37)
Albumin: 4 g/dL (ref 3.5–5.2)
Alkaline Phosphatase: 41 U/L (ref 39–117)
BUN: 27 mg/dL — ABNORMAL HIGH (ref 6–23)
CO2: 30 mEq/L (ref 19–32)
Calcium: 9.4 mg/dL (ref 8.4–10.5)
Chloride: 97 mEq/L (ref 96–112)
Creatinine, Ser: 1.34 mg/dL (ref 0.40–1.50)
GFR: 45.93 mL/min — ABNORMAL LOW (ref >60.00)
Glucose, Bld: 82 mg/dL (ref 70–99)
Potassium: 3.9 mEq/L (ref 3.5–5.1)
Sodium: 135 mEq/L (ref 135–145)
Total Bilirubin: 1.1 mg/dL (ref 0.2–1.2)
Total Protein: 6.4 g/dL (ref 6.0–8.3)

## 2023-03-12 LAB — CBC WITH DIFFERENTIAL/PLATELET
Basophils Absolute: 0 10*3/uL (ref 0.0–0.1)
Basophils Relative: 0.3 % (ref 0.0–3.0)
Eosinophils Absolute: 0.2 10*3/uL (ref 0.0–0.7)
Eosinophils Relative: 2.7 % (ref 0.0–5.0)
HCT: 42.6 % (ref 39.0–52.0)
Hemoglobin: 14.3 g/dL (ref 13.0–17.0)
Lymphocytes Relative: 26.7 % (ref 12.0–46.0)
Lymphs Abs: 1.8 10*3/uL (ref 0.7–4.0)
MCHC: 33.6 g/dL (ref 30.0–36.0)
MCV: 91.6 fl (ref 78.0–100.0)
Monocytes Absolute: 1.1 10*3/uL — ABNORMAL HIGH (ref 0.1–1.0)
Monocytes Relative: 17 % — ABNORMAL HIGH (ref 3.0–12.0)
Neutro Abs: 3.6 10*3/uL (ref 1.4–7.7)
Neutrophils Relative %: 53.3 % (ref 43.0–77.0)
Platelets: 161 10*3/uL (ref 150.0–400.0)
RBC: 4.65 Mil/uL (ref 4.22–5.81)
RDW: 16.4 % — ABNORMAL HIGH (ref 11.5–15.5)
WBC: 6.8 10*3/uL (ref 4.0–10.5)

## 2023-03-22 DIAGNOSIS — H401122 Primary open-angle glaucoma, left eye, moderate stage: Secondary | ICD-10-CM | POA: Diagnosis not present

## 2023-04-02 ENCOUNTER — Ambulatory Visit (INDEPENDENT_AMBULATORY_CARE_PROVIDER_SITE_OTHER): Payer: Medicare Other | Admitting: Cardiology

## 2023-04-02 ENCOUNTER — Encounter (HOSPITAL_BASED_OUTPATIENT_CLINIC_OR_DEPARTMENT_OTHER): Payer: Self-pay | Admitting: Cardiology

## 2023-04-02 VITALS — BP 130/84 | HR 116 | Ht 66.0 in | Wt 152.8 lb

## 2023-04-02 DIAGNOSIS — I5032 Chronic diastolic (congestive) heart failure: Secondary | ICD-10-CM | POA: Diagnosis not present

## 2023-04-02 DIAGNOSIS — Z7901 Long term (current) use of anticoagulants: Secondary | ICD-10-CM

## 2023-04-02 DIAGNOSIS — I340 Nonrheumatic mitral (valve) insufficiency: Secondary | ICD-10-CM | POA: Diagnosis not present

## 2023-04-02 DIAGNOSIS — E782 Mixed hyperlipidemia: Secondary | ICD-10-CM

## 2023-04-02 DIAGNOSIS — D6869 Other thrombophilia: Secondary | ICD-10-CM

## 2023-04-02 DIAGNOSIS — I10 Essential (primary) hypertension: Secondary | ICD-10-CM

## 2023-04-02 DIAGNOSIS — I4821 Permanent atrial fibrillation: Secondary | ICD-10-CM

## 2023-04-02 NOTE — Patient Instructions (Signed)
Medication Instructions:  Your physician recommends that you continue on your current medications as directed. Please refer to the Current Medication list given to you today.   *If you need a refill on your cardiac medications before your next appointment, please call your pharmacy*  Lab Work: NONE   Testing/Procedures: NONE   Follow-Up: At [facility], you and your health needs are our priority.  As part of our continuing mission to provide you with exceptional heart care, we have created designated Provider Care Teams.  These Care Teams include your primary Cardiologist (physician) and Advanced Practice Providers (APPs -  Physician Assistants and Nurse Practitioners) who all work together to provide you with the care you need, when you need it.  We recommend signing up for the patient portal called "MyChart".  Sign up information is provided on this After Visit Summary.  MyChart is used to connect with patients for Virtual Visits (Telemedicine).  Patients are able to view lab/test results, encounter notes, upcoming appointments, etc.  Non-urgent messages can be sent to your provider as well.   To learn more about what you can do with MyChart, go to [email or web address].    Your next appointment:   3 month(s)  The format for your next appointment:   In Person  Provider:   Bridgette Christopher, MD              

## 2023-04-02 NOTE — Progress Notes (Signed)
Cardiology Office Note:    Date:  04/02/2023   ID:  Mark Munson., DOB Nov 24, 1928, MRN [id number]  PCP:  Zola Button, Grayling Congress, DO  Cardiologist:  Jodelle Red, MD  Referring MD: Zola Button, Grayling Congress, *   CC: Follow up  History of Present Illness:    Mark Stark. is a 89 y.o. male with a hx of permanent atrial fibrillation, chronic diastolic CHF, CKD stage 3a, hypertension, dyslipidemia, aortic insufficiency, mitral regurgitation, inferior mesenteric vein thrombosis, prostate cancer, and anxiety, who is seen for follow up.   Cardiovascular history: On presentation to the hospital 10/25/2021 he complained of worsening shortness of breath, LE edema, and orthopnea. CXR revealed small bilateral effusions and mild interstitial edema. He was admitted for congestive heart failure. He did well for approximately 9 mos, but he was admitted to the hospital on 08/22/2022 after calling EMS due to progressive generalized weakness. It was also noted he was "seeing things". He denied any chest or abdominal pain and/or discomfort. EMS noted his blood pressure was low on arrival and improved with fluid therapy. He did exhibit dysarthria on examination at the ER. Given his symptoms and history he was admitted from the ER to be treated in the hospital and undergo further testing. Both initial MRI and and additional MRI showed no evidence of CVA. Symptoms improved.   He was seen in the ED 11/24/2022 for a mechanical fall. He had slipped and landed on his back, striking his head against the ground. He was able to walk but had difficulty getting in and out of a chair due to leg pain. Initial laboratory evaluation with a BUN of 24, creatinine 1.33, leukocytosis to 11.9, hemoglobin 10.3 which was noted to be a significant drop for this patient. CT head and C-spine with no acute traumatic findings.  Chest x-ray, pelvis x-ray with no acute fracture.  CT L-spine with no acute fracture but did show multilevel  foraminal stenosis.  He was found to have some small decreased pulses on the left and thus a CTA aortobifem was obtained. Discharged in stable condition.   At his last appointment he reported his mechanical fall was on 11/22/22. He had a raised, erythematous lesion of his left shin with abrasive skin wounds/discoloration ranging from his left ankle to just below the knee. This used to be constantly painful, but gradually diminished to rare twinges of pain. He was concerned due to slow healing times. He was doing well on his torsemide BID regimen. Occasionally he would skip a dose if he needed to go somewhere. Blood pressures were stable. Heart rate ranged 60-110 at home. Heart rates at home much lower typically than in office. Heart murmur much quieter when euvolemic. We discussed management/dosing of torsemide. He was euvolemic on exam. We discussed changing to daily torsemide and given PRN instructions on when to take a second daily dose. We have had to balance between optimizing heart rate and preventing hypotension. Discussed options and decided to continue his continue current dose of metoprolol.   Today, he is accompanied by a friend. He is feeling well aside from complaints of urinary frequency. He takes Torsemide every day faithfully, but he then struggles with frequency for 6-7 hours. This has been limiting his social activities. He will take Torsemide around 10 AM unless he needs to go somewhere in the morning such as church; he then takes it around 1-2 PM when he returns home. He has regular bowel movements about twice a  day.  He presents a blood pressure log for March to April which is personally reviewed and shows mostly well controlled blood pressures. One low reading of 95/79. In clinic today his BP is 130/84.  For activity, he enjoys working with his gardens. He maintains 50 ft rows of peas, beets, lettuce, and radishes. Also he states that he mows his lawn in 4 hours (previously completed in  2 hours). After an hour of mowing he will rest for 30 minutes. This way he doesn't experience significant shortness of breath or exhaustion.  He has noticed mild swelling especially in his left leg since his mechanical fall. He states his injury is 90% healed. However, he notes frequent wounds such as minor lacerations when in the garden due to thin skin.   He denies any palpitations, chest pain, lightheadedness, headaches, syncope, orthopnea, or PND.   Past Medical History:  Diagnosis Date   Acute diastolic CHF (congestive heart failure) 10/25/2021   Anxiety    Aortic insufficiency    Atrial fibrillation    Chronic diastolic CHF (congestive heart failure)    Chronic kidney disease, stage 3a    Diverticulitis    Dyslipidemia    Hypertension    Inferior mesenteric vein thrombosis    Mitral regurgitation    Nonrheumatic mitral valve regurgitation 10/28/2021   Paroxysmal atrial fibrillation 01/12/2018   Persistent atrial fibrillation    Prostate cancer     Past Surgical History:  Procedure Laterality Date   CARDIOVERSION N/A 03/01/2018   Procedure: CARDIOVERSION;  Surgeon: Lennette Bihari, MD;  Location: [facility] ENDOSCOPY;  Service: Cardiovascular;  Laterality: N/A;   CARDIOVERSION N/A 08/26/2021   Procedure: CARDIOVERSION;  Surgeon: Chrystie Nose, MD;  Location: [facility] ENDOSCOPY;  Service: Cardiovascular;  Laterality: N/A;   CARDIOVERSION N/A 12/06/2021   Procedure: CARDIOVERSION;  Surgeon: Little Ishikawa, MD;  Location: [facility] ENDOSCOPY;  Service: Cardiovascular;  Laterality: N/A;   CATARACT EXTRACTION  01/27/2010   right   CATARACT EXTRACTION  03/04/2010   left   Prostate seed implants     PROSTATE SURGERY      Current Medications: Current Outpatient Medications on File Prior to Visit  Medication Sig   apixaban (ELIQUIS) 5 MG TABS tablet Take 1 tablet (5 mg total) by mouth 2 (two) times daily.   b complex vitamins tablet Take 1 tablet by mouth daily.   Calcium Carbonate-Vitamin  D 600-400 MG-UNIT tablet Take 1 tablet by mouth daily.   clorazepate (TRANXENE) 15 MG tablet TAKE 1 TABLET BY MOUTH EVERYDAY AT BEDTIME   cromolyn (NASALCROM) 5.2 MG/ACT nasal spray Place 2 sprays into the nose 2 (two) times daily.   fenofibrate 160 MG tablet TAKE 1 TABLET BY MOUTH EVERY DAY   fish oil-omega-3 fatty acids 1000 MG capsule Take 1 g by mouth 3 (three) times daily.   latanoprost (XALATAN) 0.005 % ophthalmic solution Place 1 drop into the left eye at bedtime.   metoprolol succinate (TOPROL XL) 25 MG 24 hr tablet Take 0.5 tablets (12.5 mg total) by mouth daily.   Multiple Vitamins-Minerals (MULTIVITAMIN PO) Take 1 tablet by mouth daily.   Multiple Vitamins-Minerals (PRESERVISION AREDS 2) CAPS 2 po qd (Patient taking differently: Take 1 tablet by mouth in the morning and at bedtime.)   potassium chloride (KLOR-CON) 10 MEQ tablet TAKE 1 TABLET BY MOUTH EVERY DAY   simvastatin (ZOCOR) 10 MG tablet TAKE 1 TABLET BY MOUTH EVERYDAY AT BEDTIME   tamsulosin (FLOMAX) 0.4 MG CAPS capsule Take 0.4  mg by mouth at bedtime.   torsemide (DEMADEX) 20 MG tablet Take 1 tablet (20 mg total) by mouth daily.   vitamin C (ASCORBIC ACID) 500 MG tablet Take 500 mg by mouth daily.   No current facility-administered medications on file prior to visit.     Allergies:   Sulfonamide derivatives   Social History   Tobacco Use   Smoking status: Never   Smokeless tobacco: Never  Vaping Use   Vaping Use: Never used  Substance Use Topics   Alcohol use: No    Comment: Little wine occasionally    Drug use: No    Family History: family history includes Alzheimer's disease in his mother; Cancer (age of onset: 10) in his brother; Dementia in his mother; Mental illness in his mother.  ROS:   Please see the history of present illness. (+) Urinary frequency (+) LLE edema All other systems are reviewed and negative.    EKGs/Labs/Other Studies Reviewed:    The following studies were reviewed  today:  LLE Venous Doppler  12/04/2022: IMPRESSION: 1. No evidence of acute left lower extremity deep venous thrombosis. Visible calf veins appear to remain patent. 2. Pronounced subcutaneous edema from the mid thigh distally. 3. Small focus of chronic thrombus suspected in the left distal femoral vein (images 27-30).   CTA  Aorta  11/24/2022: IMPRESSION: 1. No acute abnormality of the lower extremity arteries. 2. 7.9 x 4.8 x 5.9 cm hematoma in the left gluteal musculature. 3. 6.1 x 3.4 x 1.9 cm hematoma in the left anteromedial shin. 4. 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 5. 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 6. Curvilinear osseous fragment seen adjacent to the lateral cortex of the left lateral femoral condyle may be sequelae of remote or acute trauma. Please correlate with point tenderness. 7. Diffuse colonic diverticulosis.   Aortic Atherosclerosis (ICD10-I70.0).  Echo 08/23/2022: IMPRESSIONS   1. Left ventricular ejection fraction, by estimation, is 60 to 65%. The  left ventricle has normal function. The left ventricle has no regional  wall motion abnormalities. There is mild concentric left ventricular  hypertrophy. Left ventricular diastolic  function could not be evaluated.   2. Right ventricular systolic function is normal. The right ventricular  size is normal. There is mildly elevated pulmonary artery systolic  pressure. The estimated right ventricular systolic pressure is 42.3 mmHg.   3. Left atrial size was severely dilated.   4. Right atrial size was mildly dilated.   5. The mitral valve is degenerative. Severe mitral valve regurgitation.  Mild mitral stenosis. The mean mitral valve gradient is 4.2 mmHg with  average heart rate of 103 bpm. Moderate mitral annular calcification.   6. The aortic valve is tricuspid.  There is moderate calcification of the  aortic valve. There is moderate thickening of the aortic valve. Aortic  valve regurgitation is mild to moderate. Mild aortic valve stenosis.   Comparison(s): No significant change from prior study. Prior images  reviewed side by side.   Echo 10/26/2021:  1. Left ventricular ejection fraction, by estimation, is 50 to 55%. The  left ventricle has normal function. The left ventricle has no regional  wall motion abnormalities. There is mild left ventricular hypertrophy.  Left ventricular diastolic parameters  are indeterminate.   2. Right ventricular systolic function is normal. The right ventricular  size is normal. There is moderately elevated pulmonary artery systolic  pressure.   3. Left atrial size was moderately dilated.   4. Posterior leaflet appears proplapsed. MR is moderate to severe, the  jet is eccentric and may underestiamte the severity.   5. Aortic valve is moderately calcified. The aortic valve is normal in  structure. Aortic valve regurgitation is moderate. No aortic stenosis is  present.   6. The inferior vena cava is dilated in size with >50% respiratory  variability, suggesting right atrial pressure of 8 mmHg.   Comparison(s): No prior Echocardiogram.   Conclusion(s)/Recommendation(s): Patient has moderate to severe mitral  regurgitation; consider TEE if clinically indicated.   Monitor 01/21/2018: Atrial fib Rare 2 - 3 second pauses.  EKG:  EKG is personally reviewed. Note that HR in the office/on ECG typically much higher than when he is at home 04/02/2023:  atrial fibrillation at 116 bpm 12/29/2022: atrial fibrillation at 118 bpm  10/23/22: atrial fibrillation at 108 bpm  09/07/2022: atypical atrial flutter at 123 bpm 08/14/2022:  atrial fibrillation at 114 bpm 02/13/2022: atrial fibrillation at 82 bpm 01/03/2022: EKG was not ordered. 11/07/2021: atrial fibrillation at 66 bpm  Recent Labs: 08/22/2022: B Natriuretic Peptide  402.7 08/25/2022: Magnesium 2.0 02/09/2023: TSH 2.40 03/12/2023: ALT 21; BUN 27; Creatinine, Ser 1.34; Hemoglobin 14.3; Platelets 161.0; Potassium 3.9; Sodium 135   Recent Lipid Panel    Component Value Date/Time   CHOL 123 02/09/2023 1400   TRIG 93 02/09/2023 1400   HDL 48 02/09/2023 1400   CHOLHDL 2.6 02/09/2023 1400   VLDL 12.2 08/10/2022 1344   LDLCALC 57 02/09/2023 1400    Physical Exam:    VS:  BP 130/84 (BP Location: Left Arm, Patient Position: Sitting, Cuff Size: Normal)   Pulse (!) 116   Ht 5\' 6"  (1.676 m)   Wt 152 lb 12.8 oz (69.3 kg)   BMI 24.66 kg/m     Wt Readings from Last 3 Encounters:  04/02/23 152 lb 12.8 oz (69.3 kg)  02/19/23 142 lb (64.4 kg)  02/09/23 148 lb (67.1 kg)    GEN: Well nourished, well developed in no acute distress HEENT: Normal, moist mucous membranes NECK: No JVD CARDIAC: irregularly irregular rhythm, normal S1 and S2, no rubs or gallops. 1/6 systolic murmur. VASCULAR: Radial and DP pulses 2+ bilaterally. No carotid bruits RESPIRATORY:  Clear to auscultation without rales, wheezing or rhonchi  ABDOMEN: Soft, non-tender, non-distended MUSCULOSKELETAL:  Ambulates independently SKIN: Warm and dry,no significant LE edema NEUROLOGIC:  Alert and oriented x 3. No focal neuro deficits noted. PSYCHIATRIC:  Normal affect    ASSESSMENT:    1. Permanent atrial fibrillation   2. Secondary hypercoagulable state   3. Long term current use of anticoagulant   4. Chronic diastolic CHF (congestive heart failure)   5. Nonrheumatic mitral valve regurgitation   6. Mixed hyperlipidemia    PLAN:    Chronic diastolic heart failure Moderate to severe mitral regurgitation when volume overloaded -NYHA class 3 on admission, now NYHA class I-II -echo with low normal EF, moderate to severe MR at peak congestion -murmur much quieter when euvolemic -continue metoprolol succinate 12.5 mg daily, avoid bradycardia but also avoid tachycardia with afib, Heart rates  at home much lower typically than in office -continue daily torsemide, has PRN instructions on when to take a second daily dose -if symptoms become difficult to manage, would consider TEE to determine if mitraclip would be beneficial. He prefers to avoid procedures based on prior  conversations, but if otherwise we cannot manage his symptoms, would re-discuss -bmet today to monitor Cr/K with current diuretic    Atrial fibrillation, permanent Atypical atrial flutter -CHA2DS2/VAS Stroke Risk Points=4  -continue apixaban 5 mg BID. Meets age criteria, monitor renal function. Weight is >60 kg -did not hold rhythm with cardioversion or amiodarone followed by cardioversion. Asymptomatic. No further plans for rhythm control -has been a balance between optimizing heart rate and preventing hypotension. Discussed options, we will continue current dose of metoprolol    Hyperlipidemia -continue simvastatin, reasonable for age -on fenofibrate as well  Cardiac risk counseling and prevention recommendations: -recommend heart healthy/Mediterranean diet, with whole grains, fruits, vegetable, fish, lean meats, nuts, and olive oil. Limit salt. -recommend moderate walking, 3-5 times/week for 30-50 minutes each session. Aim for at least 150 minutes.week. Goal should be pace of 3 miles/hours, or walking 1.5 miles in 30 minutes -recommend avoidance of tobacco products. Avoid excess alcohol.  Plan for follow up: 3 months, or sooner as needed.  Medication Adjustments/Labs and Tests Ordered: Current medicines are reviewed at length with the patient today.  Concerns regarding medicines are outlined above.   Orders Placed This Encounter  Procedures   EKG 12-Lead   No orders of the defined types were placed in this encounter.  Patient Instructions  Medication Instructions:  Your physician recommends that you continue on your current medications as directed. Please refer to the Current Medication list given to you  today.  *If you need a refill on your cardiac medications before your next appointment, please call your pharmacy*  Lab Work: NONE  Testing/Procedures: NONE  Follow-Up: At [facility], you and your health needs are our priority.  As part of our continuing mission to provide you with exceptional heart care, we have created designated Provider Care Teams.  These Care Teams include your primary Cardiologist (physician) and Advanced Practice Providers (APPs -  Physician Assistants and Nurse Practitioners) who all work together to provide you with the care you need, when you need it.  We recommend signing up for the patient portal called "MyChart".  Sign up information is provided on this After Visit Summary.  MyChart is used to connect with patients for Virtual Visits (Telemedicine).  Patients are able to view lab/test results, encounter notes, upcoming appointments, etc.  Non-urgent messages can be sent to your provider as well.   To learn more about what you can do with MyChart, go to [email or web address].    Your next appointment:   3 month(s)  The format for your next appointment:   In Person  Provider:   Jodelle Red, MD      [facility] Stumpf,acting as a scribe for Jodelle Red, MD.,have documented all relevant documentation on the behalf of Jodelle Red, MD,as directed by  Jodelle Red, MD while in the presence of Jodelle Red, MD.   I, Jodelle Red, MD, have reviewed all documentation for this visit. The documentation on 04/02/23 for the exam, diagnosis, procedures, and orders are all accurate and complete.    Signed, Jodelle Red, MD PhD 04/02/2023     [facility] : [facility]

## 2023-04-03 ENCOUNTER — Ambulatory Visit: Payer: Self-pay | Admitting: Licensed Clinical Social Worker

## 2023-04-03 NOTE — Patient Outreach (Signed)
  Care Coordination   Follow Up Visit Note   04/03/2023 Name: Mark Stark. MRN: [id number] DOB: 1928/05/28  Mark Stark. is a 89 y.o. year old male who sees Mark Stark, Mark Congress, DO for primary care. I spoke with  Mark Stark. by phone today.  What matters to the patients health and wellness today?      patient wants to maintain daily activities, complete ADLs, visit with friends, work in his garden, drive to appointments and remain as independent as possible     Goals Addressed             This Visit's Progress    patient wants to maintain daily activities, complete ADLs, visit with friends, work in his garden, drive to appointments and remain as independent as possible       Care Coordination Interventions:  Spoke today with client about client needs.  Discussed ambulation of client . He said he exercises by walking daily. He feels that walking is helpful Discussed medication procurement Discussed social activities. He enjoys socializing with friends from church.  He likes going out to eat with friends. Talked about client support with PCP Discussed client support from friend, Mark Stark Discussed transport needs. Mark Stark Pleasant helps transport client as needed Encouraged Mark Stark to call LCSW as needed, for SW support at  [phone] .  Discussed hearing issues of client. Discussed edema issues. He said edema has improved. He said he might have some slight swelling but edema is improving. Discussed fact that client now has Life Alert to use for help if he falls Client appreciative of call from LCSW today     SDOH assessments and interventions completed:  Yes  SDOH Interventions Today    Flowsheet Row Most Recent Value  SDOH Interventions   Depression Interventions/Treatment  Counseling  Physical Activity Interventions Other (Comments)  [walks for exercise, if able]  Stress Interventions Provide Counseling  [has some stress in managing medical needs]         Care Coordination Interventions:  Yes, provided  Interventions Today    Flowsheet Row Most Recent Value  Chronic Disease   Chronic disease during today's visit Other  [discussed client needs with client]  General Interventions   General Interventions Discussed/Reviewed General Interventions Discussed, [employer] program support. discussed exercise of client. discussed social activities of client]  Exercise Interventions   Exercise Discussed/Reviewed Exercise Discussed  Education Interventions   Education Provided Provided Education  Provided Verbal Education On Community Resources  Mental Health Interventions   Mental Health Discussed/Reviewed Anxiety, Coping Strategies  [client feels mood is stable. He enjoys socializing with friends from church]  Safety Interventions   Safety Discussed/Reviewed Home Safety       Follow up plan: Follow up call scheduled for 05/15/23 at 9:30 AM    Encounter Outcome:  Pt. Visit Completed   Kelton Pillar.Isella Slatten MSW, LCSW Licensed Visual merchandiser [facility] Care Management [phone]

## 2023-04-03 NOTE — Patient Instructions (Signed)
Visit Information  Thank you for taking time to visit with me today. Please don't hesitate to contact me if I can be of assistance to you.   Following are the goals we discussed today:   Goals Addressed             This Visit's Progress    patient wants to maintain daily activities, complete ADLs, visit with friends, work in his garden, drive to appointments and remain as independent as possible       Care Coordination Interventions:  Spoke today with client about client needs.  Discussed ambulation of client . He said he exercises by walking daily. He feels that walking is helpful Discussed medication procurement Discussed social activities. He enjoys socializing with friends from church.  He likes going out to eat with friends. Talked about client support with PCP Discussed client support from friend, Wonda Olds Discussed transport needs. Hosie Poisson [place] helps transport client as needed Encouraged Jerryn to call LCSW as needed, for SW support at  [phone] .  Discussed hearing issues of client. Discussed edema issues. He said edema has improved. He said he might have some slight swelling but edema is improving. Discussed Life Alert. Client now has Life Alert to utilize as needed Client appreciative of call from LCSW today     Our next appointment is by telephone on 05/15/23 at 9:30 AM   Please call the care guide team at [phone] if you need to cancel or reschedule your appointment.   If you are experiencing a Mental Health or Behavioral Health Crisis or need someone to talk to, please go to [facility] [address] [place] [phone])   The patient verbalized understanding of instructions, educational materials, and care plan provided today and DECLINED offer to receive copy of patient instructions, educational materials, and care plan.   The patient has been provided with contact information for the care  management team and has been advised to call with any health related questions or concerns.   Kelton Pillar.Davidlee Jeanbaptiste MSW, LCSW Licensed Visual merchandiser [facility] Care Management [phone]

## 2023-04-05 DIAGNOSIS — H35373 Puckering of macula, bilateral: Secondary | ICD-10-CM | POA: Diagnosis not present

## 2023-04-05 DIAGNOSIS — H353231 Exudative age-related macular degeneration, bilateral, with active choroidal neovascularization: Secondary | ICD-10-CM | POA: Diagnosis not present

## 2023-04-18 ENCOUNTER — Other Ambulatory Visit: Payer: Self-pay | Admitting: Family Medicine

## 2023-05-05 ENCOUNTER — Other Ambulatory Visit: Payer: Self-pay | Admitting: Cardiology

## 2023-05-05 ENCOUNTER — Other Ambulatory Visit: Payer: Self-pay | Admitting: Family

## 2023-05-05 ENCOUNTER — Other Ambulatory Visit: Payer: Self-pay | Admitting: Family Medicine

## 2023-05-05 DIAGNOSIS — I5032 Chronic diastolic (congestive) heart failure: Secondary | ICD-10-CM

## 2023-05-05 DIAGNOSIS — F411 Generalized anxiety disorder: Secondary | ICD-10-CM

## 2023-05-07 ENCOUNTER — Other Ambulatory Visit: Payer: Medicare Other

## 2023-05-07 NOTE — Telephone Encounter (Signed)
Rx request sent to pharmacy.  

## 2023-05-07 NOTE — Telephone Encounter (Signed)
Requesting: Tranxene Contract: 02/10/2022 UDS: 02/10/2022 Last OV: 01/30/2023 Next OV: 08/10/2023 Last Refill: 03/02/23, #30--1 RF Database:   Please advise

## 2023-05-15 ENCOUNTER — Encounter: Payer: Self-pay | Admitting: Family Medicine

## 2023-05-15 ENCOUNTER — Ambulatory Visit (HOSPITAL_BASED_OUTPATIENT_CLINIC_OR_DEPARTMENT_OTHER)
Admission: RE | Admit: 2023-05-15 | Discharge: 2023-05-15 | Disposition: A | Discharge status: Home / Self Care | Payer: Medicare Other | Source: Ambulatory Visit | Attending: Family Medicine | Admitting: Family Medicine

## 2023-05-15 ENCOUNTER — Ambulatory Visit: Payer: Self-pay | Admitting: Licensed Clinical Social Worker

## 2023-05-15 ENCOUNTER — Ambulatory Visit (INDEPENDENT_AMBULATORY_CARE_PROVIDER_SITE_OTHER): Payer: Medicare Other | Admitting: Family Medicine

## 2023-05-15 VITALS — BP 140/100 | HR 69 | Temp 98.3°F | Resp 20 | Ht 66.0 in | Wt 157.4 lb

## 2023-05-15 DIAGNOSIS — M7989 Other specified soft tissue disorders: Secondary | ICD-10-CM | POA: Diagnosis not present

## 2023-05-15 DIAGNOSIS — L089 Local infection of the skin and subcutaneous tissue, unspecified: Secondary | ICD-10-CM | POA: Diagnosis not present

## 2023-05-15 DIAGNOSIS — R0602 Shortness of breath: Secondary | ICD-10-CM | POA: Insufficient documentation

## 2023-05-15 DIAGNOSIS — D696 Thrombocytopenia, unspecified: Secondary | ICD-10-CM

## 2023-05-15 DIAGNOSIS — I4819 Other persistent atrial fibrillation: Secondary | ICD-10-CM | POA: Diagnosis not present

## 2023-05-15 DIAGNOSIS — W57XXXA Bitten or stung by nonvenomous insect and other nonvenomous arthropods, initial encounter: Secondary | ICD-10-CM

## 2023-05-15 DIAGNOSIS — Z8719 Personal history of other diseases of the digestive system: Secondary | ICD-10-CM

## 2023-05-15 DIAGNOSIS — R609 Edema, unspecified: Secondary | ICD-10-CM

## 2023-05-15 DIAGNOSIS — S40861A Insect bite (nonvenomous) of right upper arm, initial encounter: Secondary | ICD-10-CM

## 2023-05-15 MED ORDER — MAGIC MOUTHWASH W/LIDOCAINE
5.0000 mL | Freq: Four times a day (QID) | ORAL | 0 refills | Status: DC | PRN
Start: 2023-05-15 — End: 2024-07-15

## 2023-05-15 MED ORDER — DOXYCYCLINE HYCLATE 100 MG PO TABS: 100.0000 mg | ORAL_TABLET | Freq: Two times a day (BID) | ORAL | 0 refills | Status: DC

## 2023-05-15 NOTE — Assessment & Plan Note (Signed)
Stable Per cardiology 

## 2023-05-15 NOTE — Patient Outreach (Signed)
  Care Coordination   Follow Up Visit Note   05/15/2023 Name: Mark Stark. MRN: [id number] DOB: [birth year]  Mark Stark. is a 89 y.o. year old male who sees Zola Button, Grayling Congress, DO for primary care. I spoke with  Mark Stark. by phone today.  What matters to the patients health and wellness today?     Patient Stated he has medical symptoms that have occurred in past few weeks. He plans to call RN at PCP office to discuss symptoms he is experiencing (pt-stated)      Goals Addressed               This Visit's Progress     Patient Stated he has medical symptoms that have occurred in past few weeks. He plans to call RN at PCP office to discuss symptoms he is experiencing (pt-stated)        Interventions:  Discussed client needs with client Discussed program support with Randol Kern described medical symptoms that he has experienced in past two weeks. LCSW encouraged Dresden to call RN at PCP office to talk with this RN about his current medical symptoms. Wynton said he would call RN at PCP office today to talk with RN about his current symptoms Discussed medication procurement Discussed walking of client. He likes to walk occasionally for exercise Discussed social support. He has a friend, Wonda Olds , who is supportive. [name] [place] transports client to and from client medical appointments Provided counseling support Client said he likes going out to eat with friends as a way to relax Discussed vision needs of client. He sees medical provider for vision care Encouraged client to call LCSW as needed for SW support at [phone].        SDOH assessments and interventions completed:  Yes  SDOH Interventions Today    Flowsheet Row Most Recent Value  SDOH Interventions   Depression Interventions/Treatment  Counseling  Physical Activity Interventions Other (Comments)  [may fatigue when walking]  Stress Interventions Provide Counseling  [has stress related to  managing medical needs]        Care Coordination Interventions:  Yes, provided   Interventions Today    Flowsheet Row Most Recent Value  Chronic Disease   Chronic disease during today's visit Other  [spoke with client about client needs]  General Interventions   General Interventions Discussed/Reviewed General Interventions Discussed, [employer]  [discussed program support]  Exercise Interventions   Exercise Discussed/Reviewed Physical Activity  [enjoys walking for exercise]  Physical Activity Discussed/Reviewed Physical Activity Discussed  Education Interventions   Education Provided Provided Education  Provided Verbal Education On [employer] said he will call PCP office to discuss with RN at practice current symptoms of client]  Mental Health Interventions   Mental Health Discussed/Reviewed Anxiety, Coping Strategies  [client feels that his mood is stable]  Pharmacy Interventions   Pharmacy Dicussed/Reviewed Pharmacy Topics Discussed  Safety Interventions   Safety Discussed/Reviewed Fall Risk        Follow up plan: Follow up call scheduled for 07/04/23 at 10:00 AM    Encounter Outcome:  Pt. Visit Completed   Kelton Pillar.Tynetta Bachmann MSW, LCSW Licensed Visual merchandiser [facility] Care Management [phone]

## 2023-05-15 NOTE — Assessment & Plan Note (Signed)
Doxycycline 100 bid x 10 days  Check labs

## 2023-05-15 NOTE — Assessment & Plan Note (Signed)
Check labs 

## 2023-05-15 NOTE — Patient Instructions (Signed)
Visit Information  Thank you for taking time to visit with me today. Please don't hesitate to contact me if I can be of assistance to you.   Following are the goals we discussed today:   Goals Addressed               This Visit's Progress     Patient Stated he has medical symptoms that have occurred in past few weeks. He plans to call RN at PCP office to discuss symptoms he is experiencing (pt-stated)        Interventions:  Discussed client needs with client Discussed program support with Randol Kern described medical symptoms that he has experienced in past two weeks. LCSW encouraged Tania to call RN at PCP office to talk with this RN about his current medical symptoms. Kyrion said he would call RN at PCP office today to talk with RN about his current symptoms Discussed medication procurement Discussed walking of client. He likes to walk occasionally for exercise Discussed social support. He has a friend, Wonda Olds , who is supportive. [name] [place] transports client to and from client medical appointments Provided counseling support Client said he likes going out to eat with friends as a way to relax Discussed vision needs of client. He sees medical provider for vision care Encouraged client to call LCSW as needed for SW support at [phone].        Our next appointment is by telephone on 07/04/23 at 10:00 AM   Please call the care guide team at [phone] if you need to cancel or reschedule your appointment.   If you are experiencing a Mental Health or Behavioral Health Crisis or need someone to talk to, please go to [facility] [address] [place] [phone])   The patient verbalized understanding of instructions, educational materials, and care plan provided today and DECLINED offer to receive copy of patient instructions, educational materials, and care plan.   The patient has been provided with contact information for  the care management team and has been advised to call with any health related questions or concerns.   Kelton Pillar.Selisa Tensley MSW, LCSW Licensed Visual merchandiser [facility] Care Management [phone]

## 2023-05-15 NOTE — Progress Notes (Signed)
Established Patient Office Visit  Subjective   Patient ID: Mark Stark., male    DOB: [birth year]  Age: 89 y.o. MRN: [id number]  Chief Complaint  Patient presents with  . Cough    Pt states non productive cough that comes and goes. Pt states mouth is sore and throat is sore.   . Edema    Pt states having swelling and rash on right leg. Rash doesn't burn or itch, or pain.    HPI    Review of Systems  Constitutional:  Negative for fever and malaise/fatigue.  HENT:  Negative for congestion.   Eyes:  Negative for blurred vision.  Respiratory:  Negative for shortness of breath.   Cardiovascular:  Positive for leg swelling. Negative for chest pain and palpitations.  Gastrointestinal:  Negative for abdominal pain, blood in stool and nausea.  Genitourinary:  Negative for dysuria and frequency.  Musculoskeletal:  Negative for falls.  Skin:  Positive for rash.  Neurological:  Negative for dizziness, loss of consciousness and headaches.  Endo/Heme/Allergies:  Negative for environmental allergies.  Psychiatric/Behavioral:  Negative for depression. The patient is not nervous/anxious.       Objective:     BP (!) 140/100 (BP Location: Left Arm, Patient Position: Sitting, Cuff Size: Normal)   Pulse 69   Temp 98.3 F (36.8 C) (Oral)   Resp 20   Ht 5\' 6"  (1.676 m)   Wt 157 lb 6.4 oz (71.4 kg)   SpO2 98%   BMI 25.41 kg/m    Physical Exam Vitals and nursing note reviewed.  Constitutional:      Appearance: He is well-developed.  HENT:     Head: Normocephalic and atraumatic.  Eyes:     Pupils: Pupils are equal, round, and reactive to light.  Neck:     Thyroid: No thyromegaly.  Cardiovascular:     Rate and Rhythm: Normal rate and regular rhythm.     Heart sounds: No murmur heard. Pulmonary:     Effort: Pulmonary effort is normal. No respiratory distress.     Breath sounds: Normal breath sounds. No wheezing or rales.  Chest:     Chest wall: No tenderness.   Musculoskeletal:        General: Swelling present. No tenderness.     Cervical back: Normal range of motion and neck supple.     Right hip: Normal range of motion. Normal strength.     Left hip: Normal range of motion. Normal strength.     Right lower leg: Edema present.     Left lower leg: Edema present.     Right foot: Bony tenderness present. No swelling.     Left foot: Bony tenderness present. No swelling.     Comments: + swelling both hands --- rings do not fit   Skin:    General: Skin is warm and dry.     Findings: Erythema and rash present. Rash is macular.     Comments: Red flat lesions -- non blanching on legs abd and  Bulls eye rash in R axilla  Neurological:     Mental Status: He is alert and oriented to person, place, and time.  Psychiatric:        Behavior: Behavior normal.        Thought Content: Thought content normal.        Judgment: Judgment normal.    No results found for any visits on 05/15/23.    The ASCVD Risk score (Arnett DK, et  al., 2019) failed to calculate for the following reasons:   The 2019 ASCVD risk score is only valid for ages 52 to 55    Assessment & Plan:   Problem List Items Addressed This Visit       Unprioritized   Tick bite of right axilla with infection - Primary    Doxycycline 100 bid x 10 days  Check labs       Relevant Medications   doxycycline (VIBRA-TABS) 100 MG tablet   magic mouthwash w/lidocaine SOLN   Other Relevant Orders   Alpha-Gal Panel   Lyme Disease Serology w/Reflex   Rocky mtn spotted fvr abs pnl(IgG+IgM)   CBC with Differential/Platelet   Comprehensive metabolic panel   Thrombocytopenia (HCC)    Check labs       SOB (shortness of breath)   Relevant Orders   DG Chest 2 View (Completed)   CBC with Differential/Platelet   Comprehensive metabolic panel   TSH   Persistent atrial fibrillation (HCC)    Stable  Per cardiology      H/O oral aphthous ulcers   Relevant Medications   magic mouthwash  w/lidocaine SOLN   EDEMA   Relevant Orders   CBC with Differential/Platelet   Comprehensive metabolic panel   TSH    No follow-ups on file.    Donato Schultz, DO

## 2023-05-16 LAB — CBC WITH DIFFERENTIAL/PLATELET
Basophils Absolute: 0 10*3/uL (ref 0.0–0.1)
Basophils Relative: 0.2 % (ref 0.0–3.0)
Eosinophils Absolute: 0.2 10*3/uL (ref 0.0–0.7)
Eosinophils Relative: 2.2 % (ref 0.0–5.0)
HCT: 41.6 % (ref 39.0–52.0)
Hemoglobin: 13.9 g/dL (ref 13.0–17.0)
Lymphocytes Relative: 4.7 % — ABNORMAL LOW (ref 12.0–46.0)
Lymphs Abs: 0.4 10*3/uL — ABNORMAL LOW (ref 0.7–4.0)
MCHC: 33.5 g/dL (ref 30.0–36.0)
MCV: 95 fl (ref 78.0–100.0)
Monocytes Absolute: 0.4 10*3/uL (ref 0.1–1.0)
Monocytes Relative: 4.8 % (ref 3.0–12.0)
Neutro Abs: 8.2 10*3/uL — ABNORMAL HIGH (ref 1.4–7.7)
Neutrophils Relative %: 88.1 % — ABNORMAL HIGH (ref 43.0–77.0)
Platelets: 175 10*3/uL (ref 150.0–400.0)
RBC: 4.38 Mil/uL (ref 4.22–5.81)
RDW: 14.9 % (ref 11.5–15.5)
WBC: 9.3 10*3/uL (ref 4.0–10.5)

## 2023-05-16 LAB — TSH: TSH: 1.25 u[IU]/mL (ref 0.35–5.50)

## 2023-05-16 LAB — COMPREHENSIVE METABOLIC PANEL
ALT: 24 U/L (ref 0–53)
AST: 32 U/L (ref 0–37)
Albumin: 3.9 g/dL (ref 3.5–5.2)
Alkaline Phosphatase: 45 U/L (ref 39–117)
BUN: 24 mg/dL — ABNORMAL HIGH (ref 6–23)
CO2: 28 mEq/L (ref 19–32)
Calcium: 9.2 mg/dL (ref 8.4–10.5)
Chloride: 92 mEq/L — ABNORMAL LOW (ref 96–112)
Creatinine, Ser: 1.24 mg/dL (ref 0.40–1.50)
GFR: 50.34 mL/min — ABNORMAL LOW (ref >60.00)
Glucose, Bld: 91 mg/dL (ref 70–99)
Potassium: 4.5 mEq/L (ref 3.5–5.1)
Sodium: 129 mEq/L — ABNORMAL LOW (ref 135–145)
Total Bilirubin: 1.7 mg/dL — ABNORMAL HIGH (ref 0.2–1.2)
Total Protein: 6.4 g/dL (ref 6.0–8.3)

## 2023-05-17 LAB — LYME DISEASE SEROLOGY W/REFLEX: Lyme Total Antibody EIA: NEGATIVE

## 2023-05-22 ENCOUNTER — Telehealth: Payer: Self-pay | Admitting: Cardiology

## 2023-05-22 ENCOUNTER — Ambulatory Visit (INDEPENDENT_AMBULATORY_CARE_PROVIDER_SITE_OTHER): Payer: Medicare Other | Admitting: Family Medicine

## 2023-05-22 ENCOUNTER — Encounter: Payer: Self-pay | Admitting: Family Medicine

## 2023-05-22 VITALS — BP 100/80 | HR 112 | Temp 97.6°F | Resp 20 | Ht 66.0 in | Wt 157.4 lb

## 2023-05-22 DIAGNOSIS — L03115 Cellulitis of right lower limb: Secondary | ICD-10-CM | POA: Diagnosis not present

## 2023-05-22 MED ORDER — CEFTRIAXONE SODIUM 1 G IJ SOLR
1.0000 g | Freq: Once | INTRAMUSCULAR | Status: AC
Start: 2023-05-22 — End: 2023-05-22
  Administered 2023-05-22: 1 g via INTRAMUSCULAR

## 2023-05-22 MED ORDER — CEPHALEXIN 500 MG PO CAPS
500.0000 mg | ORAL_CAPSULE | Freq: Four times a day (QID) | ORAL | 0 refills | Status: DC
Start: 2023-05-22 — End: 2023-05-25

## 2023-05-22 NOTE — Progress Notes (Addendum)
Subjective:   By signing my name below, I, Shehryar Baig, attest that this documentation has been prepared under the direction and in the presence of Donato Schultz, DO. 05/22/2023   Patient ID: Mark Stark., male    DOB: May 15, 1928, 89 y.o.   MRN: [id number]  Chief Complaint  Patient presents with   Rash    Rash Pertinent negatives include no congestion, fever or shortness of breath.   Patient is in today for a follow up visit.   He complains his rash, redness, and blisters on his left lower leg for the past week. He has discharge from his rash. He denies pain in his lower left leg. He continues taking anti-biotics at this time and has 5 days left for his course.  He also complains of low energy levels and fatigue since the day before his other symptoms started.  He tested positive for alpha gall during his last visit after being bitten by a tick. He has eaten a ham sandwich 2 days ago. He continues having sores in his mouth.    Past Medical History:  Diagnosis Date   Acute diastolic CHF (congestive heart failure) (HCC) 10/25/2021   Anxiety    Aortic insufficiency    Atrial fibrillation (HCC)    Chronic diastolic CHF (congestive heart failure) (HCC)    Chronic kidney disease, stage 3a (HCC)    Diverticulitis    Dyslipidemia    Hypertension    Inferior mesenteric vein thrombosis (HCC)    Mitral regurgitation    Nonrheumatic mitral valve regurgitation 10/28/2021   Paroxysmal atrial fibrillation (HCC) 01/12/2018   Persistent atrial fibrillation (HCC)    Prostate cancer [facility])     Past Surgical History:  Procedure Laterality Date   CARDIOVERSION N/A 03/01/2018   Procedure: CARDIOVERSION;  Surgeon: Lennette Bihari, MD;  Location: [facility] ENDOSCOPY;  Service: Cardiovascular;  Laterality: N/A;   CARDIOVERSION N/A 08/26/2021   Procedure: CARDIOVERSION;  Surgeon: Chrystie Nose, MD;  Location: [facility] ENDOSCOPY;  Service: Cardiovascular;  Laterality: N/A;   CARDIOVERSION N/A  12/06/2021   Procedure: CARDIOVERSION;  Surgeon: Little Ishikawa, MD;  Location: [facility] ENDOSCOPY;  Service: Cardiovascular;  Laterality: N/A;   CATARACT EXTRACTION  01/27/2010   right   CATARACT EXTRACTION  03/04/2010   left   Prostate seed implants     PROSTATE SURGERY      Family History  Problem Relation Age of Onset   Dementia Mother    Alzheimer's disease Mother    Mental illness Mother        ALZ DISEASE   Cancer Brother 55       pancreatic cancer    Social History   Socioeconomic History   Marital status: Widowed    Spouse name: Not on file   Number of children: Not on file   Years of education: Not on file   Highest education level: Not on file  Occupational History   Occupation: Professor---RETIRED    Employer: RETIRED    Comment: Retired  Tobacco Use   Smoking status: Never   Smokeless tobacco: Never  Vaping Use   Vaping Use: Never used  Substance and Sexual Activity   Alcohol use: No    Comment: Little wine occasionally    Drug use: No   Sexual activity: Never    Partners: Female  Other Topics Concern   Not on file  Social History Narrative   Not on file   Social Determinants of Health  Financial Resource Strain: Low Risk  (02/17/2022)   Overall Financial Resource Strain (CARDIA)    Difficulty of Paying Living Expenses: Not hard at all  Food Insecurity: No Food Insecurity (08/29/2022)   Hunger Vital Sign    Worried About Running Out of Food in the Last Year: Never true    Ran Out of Food in the Last Year: Never true  Transportation Needs: No Transportation Needs (02/19/2023)   PRAPARE - Administrator, Civil Service (Medical): No    Lack of Transportation (Non-Medical): No  Physical Activity: Insufficiently Active (05/15/2023)   Exercise Vital Sign    Days of Exercise per Week: 2 days    Minutes of Exercise per Session: 10 min  Stress: Stress Concern Present (05/15/2023)   [employer] of Occupational Health - Occupational  Stress Questionnaire    Feeling of Stress : To some extent  Social Connections: Moderately Integrated (09/26/2022)   Social Connection and Isolation Panel [NHANES]    Frequency of Communication with Friends and Family: Twice a week    Frequency of Social Gatherings with Friends and Family: Twice a week    Attends Religious Services: More than 4 times per year    Active Member of [employer] or Organizations: Yes    Attends Banker Meetings: Never    Marital Status: Widowed  Recent Concern: Social Connections - Moderately Isolated (08/29/2022)   Social Connection and Isolation Panel [NHANES]    Frequency of Communication with Friends and Family: Three times a week    Frequency of Social Gatherings with Friends and Family: Twice a week    Attends Religious Services: More than 4 times per year    Active Member of [employer] or Organizations: No    Attends Banker Meetings: Never    Marital Status: Widowed  Intimate Partner Violence: Not At Risk (02/19/2023)   Humiliation, Afraid, Rape, and Kick questionnaire    Fear of Current or Ex-Partner: No    Emotionally Abused: No    Physically Abused: No    Sexually Abused: No    Outpatient Medications Prior to Visit  Medication Sig Dispense Refill   apixaban (ELIQUIS) 5 MG TABS tablet Take 1 tablet (5 mg total) by mouth 2 (two) times daily. 180 tablet 1   b complex vitamins tablet Take 1 tablet by mouth daily.     Calcium Carbonate-Vitamin D 600-400 MG-UNIT tablet Take 1 tablet by mouth daily.     clorazepate (TRANXENE) 15 MG tablet TAKE 1 TABLET BY MOUTH EVERYDAY AT BEDTIME 30 tablet 1   cromolyn (NASALCROM) 5.2 MG/ACT nasal spray Place 2 sprays into the nose 2 (two) times daily.     doxycycline (VIBRA-TABS) 100 MG tablet Take 1 tablet (100 mg total) by mouth 2 (two) times daily. 20 tablet 0   fenofibrate 160 MG tablet TAKE 1 TABLET BY MOUTH EVERY DAY 90 tablet 1   fish oil-omega-3 fatty acids 1000 MG capsule Take 1 g by mouth 3  (three) times daily.     latanoprost (XALATAN) 0.005 % ophthalmic solution Place 1 drop into the left eye at bedtime.     magic mouthwash w/lidocaine SOLN Take 5 mLs by mouth 4 (four) times daily as needed for mouth pain. Swish and Spit 240 mL 0   metoprolol succinate (TOPROL XL) 25 MG 24 hr tablet Take 0.5 tablets (12.5 mg total) by mouth daily. 45 tablet 3   Multiple Vitamins-Minerals (MULTIVITAMIN PO) Take 1 tablet by mouth daily.  Multiple Vitamins-Minerals (PRESERVISION AREDS 2) CAPS 2 po qd (Patient taking differently: Take 1 tablet by mouth in the morning and at bedtime.)     potassium chloride (KLOR-CON) 10 MEQ tablet TAKE 1 TABLET BY MOUTH EVERY DAY 90 tablet 1   simvastatin (ZOCOR) 10 MG tablet TAKE 1 TABLET BY MOUTH EVERYDAY AT BEDTIME 90 tablet 2   tamsulosin (FLOMAX) 0.4 MG CAPS capsule Take 0.4 mg by mouth at bedtime.     torsemide (DEMADEX) 20 MG tablet TAKE 1 TABLET BY MOUTH EVERY DAY 90 tablet 1   vitamin C (ASCORBIC ACID) 500 MG tablet Take 500 mg by mouth daily.     No facility-administered medications prior to visit.    Allergies  Allergen Reactions   Sulfonamide Derivatives Other (See Comments)    Unknown - patient cannot remember reaction    Review of Systems  Constitutional:  Positive for malaise/fatigue. Negative for fever.  HENT:  Negative for congestion.        (+)sores in mouth  Eyes:  Negative for blurred vision.  Respiratory:  Negative for shortness of breath.   Cardiovascular:  Negative for chest pain, palpitations and leg swelling.  Gastrointestinal:  Negative for abdominal pain, blood in stool and nausea.  Genitourinary:  Negative for dysuria and frequency.  Musculoskeletal:  Negative for falls.  Skin:  Positive for rash (lower left leg).       (+)erythema lower left leg (+)blisters lower left leg (+)discharge from rashes and blisters  Neurological:  Negative for dizziness, loss of consciousness and headaches.  Endo/Heme/Allergies:  Negative for  environmental allergies.  Psychiatric/Behavioral:  Negative for depression. The patient is not nervous/anxious.        Objective:    Physical Exam Vitals and nursing note reviewed.  Constitutional:      General: He is not in acute distress.    Appearance: Normal appearance. He is not ill-appearing.  HENT:     Head: Normocephalic and atraumatic.     Right Ear: External ear normal.     Left Ear: External ear normal.  Eyes:     Extraocular Movements: Extraocular movements intact.     Pupils: Pupils are equal, round, and reactive to light.  Cardiovascular:     Rate and Rhythm: Normal rate and regular rhythm.     Heart sounds: Normal heart sounds. No murmur heard.    No gallop.  Pulmonary:     Effort: Pulmonary effort is normal. No respiratory distress.     Breath sounds: Normal breath sounds. No wheezing or rales.  Musculoskeletal:        General: Swelling and tenderness present. Normal range of motion.     Cervical back: Normal range of motion and neck supple.     Right lower leg: Edema present.     Comments: +R calf pain with palpation   Skin:    General: Skin is warm and dry.     Findings: Erythema and lesion present.     Comments: Erythema with blistering. Swelling, and pitting edema from left ankle to the bottom left knee.  Wound culture done   Neurological:     General: No focal deficit present.     Mental Status: He is alert and oriented to person, place, and time.     Motor: No weakness.     Coordination: Coordination normal.     Gait: Gait normal.     Deep Tendon Reflexes: Reflexes normal.  Psychiatric:        Mood  and Affect: Mood normal.        Judgment: Judgment normal.     BP 100/80 (BP Location: Right Arm, Patient Position: Sitting, Cuff Size: Normal)   Pulse (!) 112   Temp 97.6 F (36.4 C) (Oral)   Resp 20   Ht 5\' 6"  (1.676 m)   Wt 157 lb 6.4 oz (71.4 kg)   BMI 25.41 kg/m  Wt Readings from Last 3 Encounters:  05/22/23 157 lb 6.4 oz (71.4 kg)   05/15/23 157 lb 6.4 oz (71.4 kg)  04/02/23 152 lb 12.8 oz (69.3 kg)       Assessment & Plan:  Cellulitis of right lower extremity Assessment & Plan: Keflex 500 mg qid  Check venous US  Wound culture done   Orders: -     US Venous Img Lower Unilateral Right (DVT); Future -     Cephalexin; Take 1 capsule (500 mg total) by mouth 4 (four) times daily.  Dispense: 40 capsule; Refill: 0 -     cefTRIAXone Sodium -     WOUND CULTURE    I, Donato Schultz, DO, personally preformed the services described in this documentation.  All medical record entries made by the scribe were at my direction and in my presence.  I have reviewed the chart and discharge instructions (if applicable) and agree that the record reflects my personal performance and is accurate and complete. 05/22/2023   I,Shehryar Baig,acting as a scribe for Donato Schultz, DO.,have documented all relevant documentation on the behalf of Donato Schultz, DO,as directed by  Donato Schultz, DO while in the presence of Donato Schultz, DO.   Donato Schultz, DO

## 2023-05-22 NOTE — Telephone Encounter (Signed)
Pt c/o BP issue: STAT if pt c/o blurred vision, one-sided weakness or slurred speech  1. What are your last 5 BP readings?  100/80  2. Are you having any other symptoms (ex. Dizziness, headache, blurred vision, passed out)?  Tiredness, HR fluctuating 60-120  3. What is your BP issue?  BP has been low

## 2023-05-22 NOTE — Assessment & Plan Note (Signed)
Keflex 500 mg qid  Check venous US  Wound culture done

## 2023-05-22 NOTE — Patient Instructions (Signed)
Alpha-gal Syndrome Alpha-gal syndrome (AGS) is an allergic reaction to a type of sugar commonly called alpha-gal. It is found in the meat and organ meats of mammals, such as cows, pigs, and sheep. It may also be found in products that come from animals, such as gelatin, medicines, medicine capsules, some milk products, vaccines, and cosmetics. AGS causes an allergic reaction that can be immediate or delayed for several hours and can range from mild to severe. A mild reaction may cause nausea, vomiting, or an itchy rash (hives). A severe reaction can cause breathing difficulties or loss of consciousness (anaphylaxis). This can be life-threatening. What are the causes? This allergy is first triggered by a tick bite from a lone star or blackleg tick. These ticks bite animals, such as cows, pigs, or sheep, and pick up the alpha-gal sugar from their blood. If the same tick bites you, it may cause your body's defense system (immune system) to produce antibodies to alpha-gal and cause the allergic reaction. What increases the risk? People who live in areas of the [place] where the lone star tick is common are at highest risk, these areas may include: Southeastern. [place]. [place] into parts of [place]. People who are hunters and people who work in jobs that involve caring for trees (foresters) have an increased risk of this condition. What are the signs or symptoms? AGS may not cause an allergic reaction every time you eat red meat or come into contact with alpha-gal. If you do have a reaction, symptoms may include: Hives. Severe stomachache. Nausea or vomiting. Swelling of the lips, face, tongue, or throat. Making a high-pitched whistling sound when you breathe, most often when you breathe out (wheezing). Sneezing and runny nose. Headache. Symptoms of anaphylaxis may include: Difficulty breathing. Difficulty swallowing. Dizziness. Fainting. This may happen due to a sudden drop in  blood pressure. You may have AGS if you had anaphylaxis after eating something but do not have any known food allergies. Unlike other food allergies, the reaction does not start soon after the exposure to alpha-gal. There may be a delay of several hours. How is this diagnosed? This condition may be diagnosed based on signs and symptoms of the condition, especially if you have a history of tick bites and a delayed reaction to red meat. You may also have a blood test to check for antibodies to alpha-gal or a skin test to see if there is a reaction to alpha-gal. How is this treated? This condition may be treated by: Avoiding meat and organ meats that may contain alpha-gal. Avoiding medicines or other products that may contain alpha-gal. Using medicines to reduce an allergic reaction. Carrying an epinephrine auto-injector to use in case of a severe AGS reaction. AGS should be treated by an allergist or a health care provider who has experience with AGS. Follow these instructions at home:  Medicines Take over-the-counter and prescription medicines only as told by your health care provider. Follow instructions from your health care provider about when and how to use an epinephrine auto-injector. General instructions Avoid red meat and organ meat, and check food labels for meat-based ingredients in packaged foods such as soup, gravy, and flavoring. Work with your allergist to find what other foods or products you may need to avoid, some people may benefit from avoiding dairy. Keep all follow-up visits. This is important. How is this prevented? Take steps to prevent tick bites as frequent tick bites may increase the risk of an AGS reaction. These steps  include: Avoiding woods and fields with high grass. Wearing clothing protected with the anti-tick chemical permethrin when outdoors in these areas or using a U.S. Environmental Protection Agency-approved insect repellent. Checking your clothing for  ticks before you come back indoors. Checking your body for ticks when you take a shower. Checking your pets for ticks. Where to find more information Centers for Disease Control and Prevention: [employer]Exhibition.com.br [employer] of Allergy and Infectious Diseases: https://hahn.com/ Contact a health care provider if: You have any signs or symptoms of AGS food allergy. You have a tick bite that causes a skin reaction. Get help right away if: You have a severe AGS reaction. You have trouble swallowing or breathing. These symptoms may represent a serious problem that is an emergency. Do not wait to see if the symptoms will go away. Get medical help right away. Call your local emergency services (911 in the [place] Do not drive yourself to the hospital. Summary AGS is a food allergy caused by a tick bite. Red meats, organ meats, and other products or medicines may trigger the alpha-gal reaction. AGS reactions can be mild or severe. If you have AGS, you should not eat red meat, organ meat, or products that may contain alpha-gal. Avoiding tick bites prevents AGS and more serious AGS reactions. This information is not intended to replace advice given to you by your health care provider. Make sure you discuss any questions you have with your health care provider. Document Revised: 03/29/2021 Document Reviewed: 03/29/2021 Elsevier Patient Education  2024 [employer].

## 2023-05-22 NOTE — Telephone Encounter (Signed)
Returned call to patient, as do not have DPR on file to speak with Rayfield Citizen.    He states that he got bitten by a tick, but he is not sure how long ago it bit him. He did have this removed on 5/21. He states he was told from his blood tests that he has had another type reaction to this, a tick bourne disease. They are having some issues with blood pressure since the tick bite. Patient has been having low blood pressures, today at PCP visit 100/80. He states he has been having lots of fatigue and his HR has been fluctuating from 60-120. He states whatever infection was caused by the tick bite has now caused a red meat allergy. He was switched from one antibiotic to another.    BP readings from the last two days:  Today- 103/65 Yesterday 96/67  Sunday-105/71 Saturday-70/66 Friday- 66/61  2-3 days prior  114/73 118/71 118/76 111/77  PCP believes this is a result of the tick bite.   He states he has also been gaining weight. He is supposed to stat around 142-144. He has more recently been over 150lb, today at Doctors office he was 157. He has still been taking his fluid pills.

## 2023-05-25 ENCOUNTER — Ambulatory Visit (HOSPITAL_BASED_OUTPATIENT_CLINIC_OR_DEPARTMENT_OTHER)
Admission: RE | Admit: 2023-05-25 | Discharge: 2023-05-25 | Disposition: A | Discharge status: Home / Self Care | Payer: Medicare Other | Source: Ambulatory Visit | Attending: Family Medicine | Admitting: Family Medicine

## 2023-05-25 ENCOUNTER — Telehealth: Payer: Self-pay

## 2023-05-25 ENCOUNTER — Other Ambulatory Visit: Payer: Self-pay

## 2023-05-25 ENCOUNTER — Emergency Department (HOSPITAL_BASED_OUTPATIENT_CLINIC_OR_DEPARTMENT_OTHER): Payer: Medicare Other

## 2023-05-25 ENCOUNTER — Ambulatory Visit (INDEPENDENT_AMBULATORY_CARE_PROVIDER_SITE_OTHER): Payer: Medicare Other | Admitting: Family Medicine

## 2023-05-25 ENCOUNTER — Encounter (HOSPITAL_BASED_OUTPATIENT_CLINIC_OR_DEPARTMENT_OTHER): Payer: Self-pay | Admitting: Emergency Medicine

## 2023-05-25 ENCOUNTER — Encounter: Payer: Self-pay | Admitting: Family Medicine

## 2023-05-25 ENCOUNTER — Inpatient Hospital Stay (HOSPITAL_BASED_OUTPATIENT_CLINIC_OR_DEPARTMENT_OTHER)
Admission: EM | Admit: 2023-05-25 | Discharge: 2023-05-29 | DRG: 291 | Disposition: A | Discharge status: Home Health | Payer: Medicare Other | Attending: Internal Medicine | Admitting: Internal Medicine

## 2023-05-25 VITALS — BP 124/88 | HR 123 | Ht 66.0 in | Wt 146.4 lb

## 2023-05-25 DIAGNOSIS — M791 Myalgia, unspecified site: Secondary | ICD-10-CM | POA: Diagnosis not present

## 2023-05-25 DIAGNOSIS — M79641 Pain in right hand: Secondary | ICD-10-CM

## 2023-05-25 DIAGNOSIS — R609 Edema, unspecified: Secondary | ICD-10-CM

## 2023-05-25 DIAGNOSIS — Z820 Family history of epilepsy and other diseases of the nervous system: Secondary | ICD-10-CM | POA: Diagnosis not present

## 2023-05-25 DIAGNOSIS — Z8546 Personal history of malignant neoplasm of prostate: Secondary | ICD-10-CM

## 2023-05-25 DIAGNOSIS — I10 Essential (primary) hypertension: Secondary | ICD-10-CM | POA: Diagnosis not present

## 2023-05-25 DIAGNOSIS — Z818 Family history of other mental and behavioral disorders: Secondary | ICD-10-CM | POA: Diagnosis not present

## 2023-05-25 DIAGNOSIS — R5383 Other fatigue: Secondary | ICD-10-CM | POA: Diagnosis not present

## 2023-05-25 DIAGNOSIS — L03115 Cellulitis of right lower limb: Secondary | ICD-10-CM | POA: Diagnosis present

## 2023-05-25 DIAGNOSIS — M7120 Synovial cyst of popliteal space [Baker], unspecified knee: Secondary | ICD-10-CM

## 2023-05-25 DIAGNOSIS — L03116 Cellulitis of left lower limb: Secondary | ICD-10-CM | POA: Diagnosis not present

## 2023-05-25 DIAGNOSIS — R600 Localized edema: Secondary | ICD-10-CM | POA: Diagnosis not present

## 2023-05-25 DIAGNOSIS — Z800 Family history of malignant neoplasm of digestive organs: Secondary | ICD-10-CM

## 2023-05-25 DIAGNOSIS — Z7901 Long term (current) use of anticoagulants: Secondary | ICD-10-CM

## 2023-05-25 DIAGNOSIS — I4819 Other persistent atrial fibrillation: Secondary | ICD-10-CM

## 2023-05-25 DIAGNOSIS — M79642 Pain in left hand: Secondary | ICD-10-CM

## 2023-05-25 DIAGNOSIS — I4821 Permanent atrial fibrillation: Secondary | ICD-10-CM | POA: Diagnosis present

## 2023-05-25 DIAGNOSIS — E785 Hyperlipidemia, unspecified: Secondary | ICD-10-CM | POA: Diagnosis present

## 2023-05-25 DIAGNOSIS — N1831 Chronic kidney disease, stage 3a: Secondary | ICD-10-CM | POA: Diagnosis not present

## 2023-05-25 DIAGNOSIS — E871 Hypo-osmolality and hyponatremia: Secondary | ICD-10-CM

## 2023-05-25 DIAGNOSIS — R0602 Shortness of breath: Secondary | ICD-10-CM

## 2023-05-25 DIAGNOSIS — I5033 Acute on chronic diastolic (congestive) heart failure: Secondary | ICD-10-CM | POA: Diagnosis not present

## 2023-05-25 DIAGNOSIS — R0609 Other forms of dyspnea: Secondary | ICD-10-CM | POA: Diagnosis not present

## 2023-05-25 DIAGNOSIS — I130 Hypertensive heart and chronic kidney disease with heart failure and stage 1 through stage 4 chronic kidney disease, or unspecified chronic kidney disease: Secondary | ICD-10-CM | POA: Diagnosis not present

## 2023-05-25 DIAGNOSIS — I5032 Chronic diastolic (congestive) heart failure: Secondary | ICD-10-CM

## 2023-05-25 DIAGNOSIS — M7989 Other specified soft tissue disorders: Secondary | ICD-10-CM | POA: Diagnosis not present

## 2023-05-25 DIAGNOSIS — Z79899 Other long term (current) drug therapy: Secondary | ICD-10-CM | POA: Diagnosis not present

## 2023-05-25 DIAGNOSIS — R222 Localized swelling, mass and lump, trunk: Secondary | ICD-10-CM | POA: Diagnosis not present

## 2023-05-25 DIAGNOSIS — L0390 Cellulitis, unspecified: Secondary | ICD-10-CM

## 2023-05-25 LAB — COMPREHENSIVE METABOLIC PANEL
ALT: 23 U/L (ref 0–53)
ALT: 27 U/L (ref 0–44)
AST: 29 U/L (ref 0–37)
AST: 37 U/L (ref 15–41)
Albumin: 3.6 g/dL (ref 3.5–5.2)
Albumin: 3.4 g/dL — ABNORMAL LOW (ref 3.5–5.0)
Alkaline Phosphatase: 54 U/L (ref 39–117)
Alkaline Phosphatase: 55 U/L (ref 38–126)
Anion gap: 9 (ref 5–15)
BUN: 34 mg/dL — ABNORMAL HIGH (ref 6–23)
BUN: 32 mg/dL — ABNORMAL HIGH (ref 8–23)
CO2: 28 mEq/L (ref 19–32)
CO2: 25 mmol/L (ref 22–32)
Calcium: 8.8 mg/dL (ref 8.4–10.5)
Calcium: 8.4 mg/dL — ABNORMAL LOW (ref 8.9–10.3)
Chloride: 88 mEq/L — ABNORMAL LOW (ref 96–112)
Chloride: 88 mmol/L — ABNORMAL LOW (ref 98–111)
Creatinine, Ser: 1.19 mg/dL (ref 0.40–1.50)
Creatinine, Ser: 1.21 mg/dL (ref 0.61–1.24)
GFR, Estimated: 56 mL/min — ABNORMAL LOW (ref >60)
GFR: 52.88 mL/min — ABNORMAL LOW (ref >60.00)
Glucose, Bld: 80 mg/dL (ref 70–99)
Glucose, Bld: 106 mg/dL — ABNORMAL HIGH (ref 70–99)
Potassium: 5.2 mEq/L — ABNORMAL HIGH (ref 3.5–5.1)
Potassium: 4.5 mmol/L (ref 3.5–5.1)
Sodium: 123 mEq/L — ABNORMAL LOW (ref 135–145)
Sodium: 122 mmol/L — ABNORMAL LOW (ref 135–145)
Total Bilirubin: 1.3 mg/dL — ABNORMAL HIGH (ref 0.2–1.2)
Total Bilirubin: 1.3 mg/dL — ABNORMAL HIGH (ref 0.3–1.2)
Total Protein: 6.1 g/dL (ref 6.0–8.3)
Total Protein: 6.3 g/dL — ABNORMAL LOW (ref 6.5–8.1)

## 2023-05-25 LAB — CBC WITH DIFFERENTIAL/PLATELET
Abs Immature Granulocytes: 0.23 10*3/uL — ABNORMAL HIGH (ref 0.00–0.07)
Basophils Absolute: 0.1 10*3/uL (ref 0.0–0.1)
Basophils Absolute: 0.1 10*3/uL (ref 0.0–0.1)
Basophils Relative: 0.8 % (ref 0.0–3.0)
Basophils Relative: 1 %
Eosinophils Absolute: 0.4 10*3/uL (ref 0.0–0.7)
Eosinophils Absolute: 0.4 10*3/uL (ref 0.0–0.5)
Eosinophils Relative: 4.4 % (ref 0.0–5.0)
Eosinophils Relative: 4 %
HCT: 42.8 % (ref 39.0–52.0)
HCT: 41.9 % (ref 39.0–52.0)
Hemoglobin: 14 g/dL (ref 13.0–17.0)
Hemoglobin: 14.4 g/dL (ref 13.0–17.0)
Immature Granulocytes: 2 %
Lymphocytes Relative: 13.1 % (ref 12.0–46.0)
Lymphocytes Relative: 9 %
Lymphs Abs: 1.3 10*3/uL (ref 0.7–4.0)
Lymphs Abs: 1 10*3/uL (ref 0.7–4.0)
MCH: 30.6 pg (ref 26.0–34.0)
MCHC: 32.8 g/dL (ref 30.0–36.0)
MCHC: 34.4 g/dL (ref 30.0–36.0)
MCV: 92.8 fl (ref 78.0–100.0)
MCV: 89.1 fL (ref 80.0–100.0)
Monocytes Absolute: 0.9 10*3/uL (ref 0.1–1.0)
Monocytes Absolute: 0.6 10*3/uL (ref 0.1–1.0)
Monocytes Relative: 8.7 % (ref 3.0–12.0)
Monocytes Relative: 5 %
Neutro Abs: 7.2 10*3/uL (ref 1.4–7.7)
Neutro Abs: 9.3 10*3/uL — ABNORMAL HIGH (ref 1.7–7.7)
Neutrophils Relative %: 73 % (ref 43.0–77.0)
Neutrophils Relative %: 79 %
Platelets: 297 10*3/uL (ref 150.0–400.0)
Platelets: 278 10*3/uL (ref 150–400)
RBC: 4.62 Mil/uL (ref 4.22–5.81)
RBC: 4.7 MIL/uL (ref 4.22–5.81)
RDW: 14.7 % (ref 11.5–15.5)
RDW: 13.6 % (ref 11.5–15.5)
WBC: 9.8 10*3/uL (ref 4.0–10.5)
WBC: 11.6 10*3/uL — ABNORMAL HIGH (ref 4.0–10.5)
nRBC: 0 % (ref 0.0–0.2)

## 2023-05-25 LAB — URINALYSIS, ROUTINE W REFLEX MICROSCOPIC
Bilirubin Urine: NEGATIVE
Glucose, UA: NEGATIVE mg/dL
Hgb urine dipstick: NEGATIVE
Ketones, ur: NEGATIVE mg/dL
Leukocytes,Ua: NEGATIVE
Nitrite: NEGATIVE
Protein, ur: NEGATIVE mg/dL
Specific Gravity, Urine: 1.015 (ref 1.005–1.030)
pH: 7 (ref 5.0–8.0)

## 2023-05-25 LAB — WOUND CULTURE

## 2023-05-25 LAB — VITAMIN B12: Vitamin B-12: 699 pg/mL (ref 211–911)

## 2023-05-25 LAB — TSH: TSH: 2.51 u[IU]/mL (ref 0.35–5.50)

## 2023-05-25 LAB — BRAIN NATRIURETIC PEPTIDE: B Natriuretic Peptide: 455.5 pg/mL — ABNORMAL HIGH (ref 0.0–100.0)

## 2023-05-25 LAB — LACTIC ACID, PLASMA: Lactic Acid, Venous: 1.7 mmol/L (ref 0.5–1.9)

## 2023-05-25 MED ORDER — TRAMADOL HCL 50 MG PO TABS
50.0000 mg | ORAL_TABLET | Freq: Three times a day (TID) | ORAL | 0 refills | Status: AC | PRN
Start: 2023-05-25 — End: 2023-05-30

## 2023-05-25 MED ORDER — LEVOFLOXACIN 500 MG PO TABS
500.0000 mg | ORAL_TABLET | Freq: Every day | ORAL | 0 refills | Status: DC
Start: 2023-05-25 — End: 2023-05-29

## 2023-05-25 NOTE — Progress Notes (Signed)
Subjective:   By signing my name below, I, Mark Stark, attest that this documentation has been prepared under the direction and in the presence of Seabron Spates R, DO. 05/25/2023.   Patient ID: Mark Mondo., male    DOB: [birth year], 89 y.o.   MRN: [id number]  No chief complaint on file.   HPI Patient is in today for an office visit.  He has been taking keflex for 4 days since Tuesday. At night when he goes to bed, he falls asleep for about 45 minutes then suddenly wakes up due to excruciating pain in both of his hands. He also describes this as a "hard, relentless pain that never fluctuates" localized to his entire hands bilaterally, R>L. Occasionally the pain also presents in his elbow, but not as severely. He usually gets up and wanders around for a while, which seems to help momentarily. Hasn't had any good sleep for 4 nights. Mostly he sleeps on his back with his hands at his sides. Currently his hands appear swollen. During the day he feels better in the sense that his hand pain seems to wane but is still present. He denies any known injuries to his hands. Although several weeks ago he had an isolated episode of similar hand pain at night, noted to be around the time of his prior fall. However, that was just one night of pain and now the hand pain is consistently occurring every night. He is assuming that his symptoms are due to a reaction to the antibiotic.  He complains of severe leg pain and generally feels very weak, requiring the use of a wheelchair. Previously he didn't need to use the wheelchair. His left leg appears much improved and was not wrapped. Initially his right leg was wrapped. A friend who is a nurse has been assisting him at home with wrapping. This was removed for examination today and rewrapped. Overall, the erythema seems to have improved. There is still evidence of drainage and swelling.    Also he states that he works very hard to limit his sodium intake. He  wonders if he has been overdoing this. He denies any symptoms concerning for a UTI.  He presents a BP log. On my review his blood pressures still appear to be running low. In the office today his blood pressure is 124/88. BP Readings from Last 3 Encounters:  05/25/23 124/88  05/22/23 100/80  05/15/23 (!) 140/100    Past Medical History:  Diagnosis Date   Acute diastolic CHF (congestive heart failure) (HCC) 10/25/2021   Anxiety    Aortic insufficiency    Atrial fibrillation (HCC)    Chronic diastolic CHF (congestive heart failure) (HCC)    Chronic kidney disease, stage 3a (HCC)    Diverticulitis    Dyslipidemia    Hypertension    Inferior mesenteric vein thrombosis (HCC)    Mitral regurgitation    Nonrheumatic mitral valve regurgitation 10/28/2021   Paroxysmal atrial fibrillation (HCC) 01/12/2018   Persistent atrial fibrillation (HCC)    Prostate cancer [facility])     Past Surgical History:  Procedure Laterality Date   CARDIOVERSION N/A 03/01/2018   Procedure: CARDIOVERSION;  Surgeon: Lennette Bihari, MD;  Location: [facility] ENDOSCOPY;  Service: Cardiovascular;  Laterality: N/A;   CARDIOVERSION N/A 08/26/2021   Procedure: CARDIOVERSION;  Surgeon: Chrystie Nose, MD;  Location[facility] ENDOSCOPY;  Service: Cardiovascular;  Laterality: N/A;   CARDIOVERSION N/A 12/06/2021   Procedure: CARDIOVERSION;  Surgeon: Little Ishikawa, MD;  Location: [facility] ENDOSCOPY;  Service: Cardiovascular;  Laterality: N/A;   CATARACT EXTRACTION  01/27/2010   right   CATARACT EXTRACTION  03/04/2010   left   Prostate seed implants     PROSTATE SURGERY      Family History  Problem Relation Age of Onset   Dementia Mother    Alzheimer's disease Mother    Mental illness Mother        ALZ DISEASE   Cancer Brother 80       pancreatic cancer    Social History   Socioeconomic History   Marital status: Widowed    Spouse name: Not on file   Number of children: Not on file   Years of education: Not on file    Highest education level: Not on file  Occupational History   Occupation: Professor---RETIRED    Employer: RETIRED    Comment: Retired  Tobacco Use   Smoking status: Never   Smokeless tobacco: Never  Vaping Use   Vaping Use: Never used  Substance and Sexual Activity   Alcohol use: No    Comment: Little wine occasionally    Drug use: No   Sexual activity: Never    Partners: Female  Other Topics Concern   Not on file  Social History Narrative   Not on file   Social Determinants of Health   Financial Resource Strain: Low Risk  (02/17/2022)   Overall Financial Resource Strain (CARDIA)    Difficulty of Paying Living Expenses: Not hard at all  Food Insecurity: No Food Insecurity (08/29/2022)   Hunger Vital Sign    Worried About Running Out of Food in the Last Year: Never true    Ran Out of Food in the Last Year: Never true  Transportation Needs: No Transportation Needs (02/19/2023)   PRAPARE - Administrator, Civil Service (Medical): No    Lack of Transportation (Non-Medical): No  Physical Activity: Insufficiently Active (05/15/2023)   Exercise Vital Sign    Days of Exercise per Week: 2 days    Minutes of Exercise per Session: 10 min  Stress: Stress Concern Present (05/15/2023)   [employer] of Occupational Health - Occupational Stress Questionnaire    Feeling of Stress : To some extent  Social Connections: Moderately Integrated (09/26/2022)   Social Connection and Isolation Panel [NHANES]    Frequency of Communication with Friends and Family: Twice a week    Frequency of Social Gatherings with Friends and Family: Twice a week    Attends Religious Services: More than 4 times per year    Active Member of [employer] or Organizations: Yes    Attends Banker Meetings: Never    Marital Status: Widowed  Recent Concern: Social Connections - Moderately Isolated (08/29/2022)   Social Connection and Isolation Panel [NHANES]    Frequency of Communication with Friends  and Family: Three times a week    Frequency of Social Gatherings with Friends and Family: Twice a week    Attends Religious Services: More than 4 times per year    Active Member of [employer] or Organizations: No    Attends Banker Meetings: Never    Marital Status: Widowed  Intimate Partner Violence: Not At Risk (02/19/2023)   Humiliation, Afraid, Rape, and Kick questionnaire    Fear of Current or Ex-Partner: No    Emotionally Abused: No    Physically Abused: No    Sexually Abused: No    Outpatient Medications Prior to Visit  Medication  Sig Dispense Refill   apixaban (ELIQUIS) 5 MG TABS tablet Take 1 tablet (5 mg total) by mouth 2 (two) times daily. 180 tablet 1   b complex vitamins tablet Take 1 tablet by mouth daily.     Calcium Carbonate-Vitamin D 600-400 MG-UNIT tablet Take 1 tablet by mouth daily.     clorazepate (TRANXENE) 15 MG tablet TAKE 1 TABLET BY MOUTH EVERYDAY AT BEDTIME 30 tablet 1   cromolyn (NASALCROM) 5.2 MG/ACT nasal spray Place 2 sprays into the nose 2 (two) times daily.     fenofibrate 160 MG tablet TAKE 1 TABLET BY MOUTH EVERY DAY 90 tablet 1   fish oil-omega-3 fatty acids 1000 MG capsule Take 1 g by mouth 3 (three) times daily.     latanoprost (XALATAN) 0.005 % ophthalmic solution Place 1 drop into the left eye at bedtime.     magic mouthwash w/lidocaine SOLN Take 5 mLs by mouth 4 (four) times daily as needed for mouth pain. Swish and Spit 240 mL 0   metoprolol succinate (TOPROL XL) 25 MG 24 hr tablet Take 0.5 tablets (12.5 mg total) by mouth daily. 45 tablet 3   Multiple Vitamins-Minerals (MULTIVITAMIN PO) Take 1 tablet by mouth daily.     Multiple Vitamins-Minerals (PRESERVISION AREDS 2) CAPS 2 po qd (Patient taking differently: Take 1 tablet by mouth in the morning and at bedtime.)     potassium chloride (KLOR-CON) 10 MEQ tablet TAKE 1 TABLET BY MOUTH EVERY DAY 90 tablet 1   simvastatin (ZOCOR) 10 MG tablet TAKE 1 TABLET BY MOUTH EVERYDAY AT BEDTIME 90  tablet 2   tamsulosin (FLOMAX) 0.4 MG CAPS capsule Take 0.4 mg by mouth at bedtime.     torsemide (DEMADEX) 20 MG tablet TAKE 1 TABLET BY MOUTH EVERY DAY 90 tablet 1   vitamin C (ASCORBIC ACID) 500 MG tablet Take 500 mg by mouth daily.     cephALEXin (KEFLEX) 500 MG capsule Take 1 capsule (500 mg total) by mouth 4 (four) times daily. 40 capsule 0   doxycycline (VIBRA-TABS) 100 MG tablet Take 1 tablet (100 mg total) by mouth 2 (two) times daily. (Patient not taking: Reported on 05/25/2023) 20 tablet 0   No facility-administered medications prior to visit.    Allergies  Allergen Reactions   Sulfonamide Derivatives Other (See Comments)    Unknown - patient cannot remember reaction    Review of Systems  Constitutional:  Negative for chills, fever and malaise/fatigue.  HENT:  Negative for congestion and hearing loss.   Eyes:  Negative for blurred vision and discharge.  Respiratory:  Negative for cough, sputum production and shortness of breath.   Cardiovascular:  Positive for leg swelling. Negative for chest pain and palpitations.  Gastrointestinal:  Negative for abdominal pain, blood in stool, constipation, diarrhea, heartburn, nausea and vomiting.  Genitourinary:  Negative for dysuria, frequency, hematuria and urgency.  Musculoskeletal:  Positive for myalgias. Negative for back pain and falls.       + Bilateral hand pain and swelling  Skin:  Negative for rash.       + Erythema of lower extremities  Neurological:  Positive for weakness. Negative for dizziness, sensory change, loss of consciousness and headaches.  Endo/Heme/Allergies:  Negative for environmental allergies. Does not bruise/bleed easily.  Psychiatric/Behavioral:  Negative for depression and suicidal ideas. The patient has insomnia. The patient is not nervous/anxious.        Objective:    Physical Exam Vitals and nursing note reviewed.  Constitutional:  General: He is not in acute distress.    Appearance: Normal  appearance. He is not ill-appearing.  HENT:     Head: Normocephalic and atraumatic.     Right Ear: Tympanic membrane, ear canal and external ear normal.     Left Ear: Tympanic membrane, ear canal and external ear normal.     Nose: Nose normal.  Eyes:     Extraocular Movements: Extraocular movements intact.     Pupils: Pupils are equal, round, and reactive to light.  Cardiovascular:     Rate and Rhythm: Normal rate and regular rhythm.     Heart sounds: Normal heart sounds. No murmur heard.    No gallop.     Comments: Bilateral LE edema appears improved but still present with evidence of drainage. Right lower extremity initially with leg wrappings in place. This was removed for examination and re-applied. Pulmonary:     Effort: Pulmonary effort is normal. No respiratory distress.     Breath sounds: Normal breath sounds. No wheezing or rales.  Musculoskeletal:     Right hand: Swelling and tenderness present.     Left hand: Swelling and tenderness present.     Right lower leg: Tenderness present. Edema present.     Left lower leg: Tenderness present. Edema present.     Comments: Significant swelling and tenderness of bilateral hands.  Skin:    General: Skin is warm and dry.     Findings: Erythema present.     Comments: Erythema of bilateral lower extremities, appears improved from prior visit.  Neurological:     General: No focal deficit present.     Mental Status: He is alert and oriented to person, place, and time.  Psychiatric:        Mood and Affect: Mood normal.        Behavior: Behavior normal.     BP 124/88 (BP Location: Left Arm, Patient Position: Sitting, Cuff Size: Normal)   Pulse (!) 123 Comment: reading from this morning  Ht 5\' 6"  (1.676 m)   Wt 146 lb 6.4 oz (66.4 kg)   SpO2 96%   BMI 23.63 kg/m  Wt Readings from Last 3 Encounters:  05/25/23 146 lb 6.4 oz (66.4 kg)  05/22/23 157 lb 6.4 oz (71.4 kg)  05/15/23 157 lb 6.4 oz (71.4 kg)    Diabetic Foot Exam -  Simple   No data filed    Lab Results  Component Value Date   WBC 9.3 05/15/2023   HGB 13.9 05/15/2023   HCT 41.6 05/15/2023   PLT 175.0 05/15/2023   GLUCOSE 91 05/15/2023   CHOL 123 02/09/2023   TRIG 93 02/09/2023   HDL 48 02/09/2023   LDLCALC 57 02/09/2023   ALT 24 05/15/2023   AST 32 05/15/2023   NA 129 (L) 05/15/2023   K 4.5 05/15/2023   CL 92 (L) 05/15/2023   CREATININE 1.24 05/15/2023   BUN 24 (H) 05/15/2023   CO2 28 05/15/2023   TSH 1.25 05/15/2023   PSA 0.35 12/27/2009   INR 1.2 02/27/2018   HGBA1C 5.0 05/21/2012   MICROALBUR <0.7 02/07/2021    Lab Results  Component Value Date   TSH 1.25 05/15/2023   Lab Results  Component Value Date   WBC 9.3 05/15/2023   HGB 13.9 05/15/2023   HCT 41.6 05/15/2023   MCV 95.0 05/15/2023   PLT 175.0 05/15/2023   Lab Results  Component Value Date   NA 129 (L) 05/15/2023   K 4.5 05/15/2023  CO2 28 05/15/2023   GLUCOSE 91 05/15/2023   BUN 24 (H) 05/15/2023   CREATININE 1.24 05/15/2023   BILITOT 1.7 (H) 05/15/2023   ALKPHOS 45 05/15/2023   AST 32 05/15/2023   ALT 24 05/15/2023   PROT 6.4 05/15/2023   ALBUMIN 3.9 05/15/2023   CALCIUM 9.2 05/15/2023   ANIONGAP 8 11/24/2022   EGFR 50 (L) 10/23/2022   GFR 50.34 (L) 05/15/2023   Lab Results  Component Value Date   CHOL 123 02/09/2023   Lab Results  Component Value Date   HDL 48 02/09/2023   Lab Results  Component Value Date   LDLCALC 57 02/09/2023   Lab Results  Component Value Date   TRIG 93 02/09/2023   Lab Results  Component Value Date   CHOLHDL 2.6 02/09/2023   Lab Results  Component Value Date   HGBA1C 5.0 05/21/2012       Assessment & Plan:   Problem List Items Addressed This Visit       Unprioritized   Pain in limb   Relevant Medications   traMADol (ULTRAM) 50 MG tablet   EDEMA   Relevant Orders   Ambulatory referral to Home Health   CBC with Differential/Platelet   Comprehensive metabolic panel   TSH   Vitamin B12   AMB  Referral to Community Care Coordinaton ([facility] Patients)   Essential hypertension   Relevant Orders   AMB Referral to Community Care Coordinaton ([facility] Patients)   Persistent atrial fibrillation (HCC)   Relevant Orders   AMB Referral to Community Care Coordinaton ([facility] Patients)   SOB (shortness of breath)   Cellulitis of right lower extremity - Primary   Relevant Medications   levofloxacin (LEVAQUIN) 500 MG tablet   Other Relevant Orders   Ambulatory referral to Home Health   CBC with Differential/Platelet   Comprehensive metabolic panel   TSH   Vitamin B12   Other Visit Diagnoses     Other fatigue       Relevant Orders   CBC with Differential/Platelet   Comprehensive metabolic panel   TSH   Vitamin B12   Myalgia       Relevant Orders   CBC with Differential/Platelet   Comprehensive metabolic panel   TSH   Vitamin B12        Meds ordered this encounter  Medications   levofloxacin (LEVAQUIN) 500 MG tablet    Sig: Take 1 tablet (500 mg total) by mouth daily for 7 days.    Dispense:  7 tablet    Refill:  0   traMADol (ULTRAM) 50 MG tablet    Sig: Take 1 tablet (50 mg total) by mouth every 8 (eight) hours as needed for up to 5 days.    Dispense:  15 tablet    Refill:  0    I, Donato Schultz, DO, personally preformed the services described in this documentation.  All medical record entries made by the scribe were at my direction and in my presence.  I have reviewed the chart and discharge instructions (if applicable) and agree that the record reflects my personal performance and is accurate and complete. 05/25/2023.  I,Mathew Stumpf,acting as a Neurosurgeon for [employer], DO.,have documented all relevant documentation on the behalf of Donato Schultz, DO,as directed by  Donato Schultz, DO while in the presence of Donato Schultz, DO.   Donato Schultz, DO

## 2023-05-25 NOTE — ED Notes (Signed)
[facility] called for transport @22 :34

## 2023-05-25 NOTE — Telephone Encounter (Signed)
Called pts spouse and left a VM informing pt to call the office back due to abnormal labs.

## 2023-05-25 NOTE — Telephone Encounter (Signed)
-----   Message from Donato Schultz, DO sent at 05/25/2023  3:39 PM EDT ----- No clot but pt does have a bakers cyst which can cause pain behind knee---  we can refer to ortho if this is bothering him Also ? Cyst / abscess mid thigh----  if pain does not improve with abx -- refer to surgery

## 2023-05-25 NOTE — Progress Notes (Incomplete)
This is a 89 year old male with past medical history of chronic diastolic heart failure, permanent atrial fibrillation, CKD stage III, HLD, HTN, inferior mesenteric vein thrombosis, nonrheumatic mitral valve regurg, and prostate cancer. Patient seen by PCP 05/15/2023. Patient seen for tick bite in the right axilla and a sore throat.  Physical exam at the time bull's-eye rash in the right axilla.  Red flat lesions on legs and abdomen.  Bilateral lower extremity edema noted. He was treated with doxycycline for 10 days and Magic mouthwash with lidocaine.  Labs ordered included Alpha gal panel, Lyme disease, [place] spotted fever sent off.  Patient returned to the office on 05/22/2023 with complaints of worsening rash on the right lower extremity and edema.  Examination showed improvement of the abdominal rash.  However left lower extremity had erythema with blistering, swelling, pitting edema from the left ankle to the bottom of the knee.  Patient also has right calf pain with palpitation.  Wound cultures were collected.  Patient's antibiotic changed to Keflex 4 times daily and Doppler ultrasound ordered right lower extremity.  [facility] spotted fever and Lyme were negative.  Alpha gal panel was positive which reactions to beef, and pork.   Rash, redness, and blisters on his left lower leg for the past week. He has discharge from his rash.  He also complains of low energy levels and fatigue since the day before his other symptoms started.  He tested positive for alpha gall  He continues having sores in his mouth.        02/09/2023 patient seen in office with cellulitis of left lower extremity.  Same 01/05/2023.  Treated with doxycycline for 10 days, wound culture collected.   05/15/2023  Pt states having swelling and rash on right leg. Rash doesn't burn or itch, or pain  Bilateral lower extremity edema, macular rash.  Red flat lesions, nonblanching.

## 2023-05-25 NOTE — ED Provider Notes (Signed)
[facility] [facility] Provider Note   CSN: [id number] Arrival date & time: 05/25/23  1806     History  Chief Complaint  Patient presents with   Abnormal Lab    Mark Stark. is a 89 y.o. male.  The history is provided by the patient and medical records. No language interpreter was used.  Abnormal Lab Time since result:  Today Patient referred by:  PCP Resulting agency:  Internal Result type: chemistry   Chemistry:    Sodium:  Low      Home Medications Prior to Admission medications   Medication Sig Start Date End Date Taking? Authorizing Provider  apixaban (ELIQUIS) 5 MG TABS tablet Take 1 tablet (5 mg total) by mouth 2 (two) times daily. 11/15/22   Seabron Spates R, DO  b complex vitamins tablet Take 1 tablet by mouth daily.    [provider]  Calcium Carbonate-Vitamin D 600-400 MG-UNIT tablet Take 1 tablet by mouth daily.    [provider]  clorazepate (TRANXENE) 15 MG tablet TAKE 1 TABLET BY MOUTH EVERYDAY AT BEDTIME 05/07/23   Zola Button, Grayling Congress, DO  cromolyn (NASALCROM) 5.2 MG/ACT nasal spray Place 2 sprays into the nose 2 (two) times daily.    [provider]  doxycycline (VIBRA-TABS) 100 MG tablet Take 1 tablet (100 mg total) by mouth 2 (two) times daily. Patient not taking: Reported on 05/25/2023 05/15/23   Zola Button, Myrene Buddy R, DO  fenofibrate 160 MG tablet TAKE 1 TABLET BY MOUTH EVERY DAY 04/19/23   Zola Button, Grayling Congress, DO  fish oil-omega-3 fatty acids 1000 MG capsule Take 1 g by mouth 3 (three) times daily.    [provider]  latanoprost (XALATAN) 0.005 % ophthalmic solution Place 1 drop into the left eye at bedtime. 07/24/22   [provider]  levofloxacin (LEVAQUIN) 500 MG tablet Take 1 tablet (500 mg total) by mouth daily for 7 days. 05/25/23 06/01/23  Donato Schultz, DO  magic mouthwash w/lidocaine SOLN Take 5 mLs by mouth 4 (four) times daily as needed for mouth  pain. Swish and Spit 05/15/23   Seabron Spates R, DO  metoprolol succinate (TOPROL XL) 25 MG 24 hr tablet Take 0.5 tablets (12.5 mg total) by mouth daily. 03/13/22   Jodelle Red, MD  Multiple Vitamins-Minerals (MULTIVITAMIN PO) Take 1 tablet by mouth daily.    [provider]  Multiple Vitamins-Minerals (PRESERVISION AREDS 2) CAPS 2 po qd Patient taking differently: Take 1 tablet by mouth in the morning and at bedtime. 07/05/17   Seabron Spates R, DO  potassium chloride (KLOR-CON) 10 MEQ tablet TAKE 1 TABLET BY MOUTH EVERY DAY 12/14/22   Jodelle Red, MD  simvastatin (ZOCOR) 10 MG tablet TAKE 1 TABLET BY MOUTH EVERYDAY AT BEDTIME 01/31/23   Jodelle Red, MD  tamsulosin (FLOMAX) 0.4 MG CAPS capsule Take 0.4 mg by mouth at bedtime. 11/24/21   [provider]  torsemide (DEMADEX) 20 MG tablet TAKE 1 TABLET BY MOUTH EVERY DAY 05/07/23   Jodelle Red, MD  traMADol (ULTRAM) 50 MG tablet Take 1 tablet (50 mg total) by mouth every 8 (eight) hours as needed for up to 5 days. 05/25/23 05/30/23  Donato Schultz, DO  vitamin C (ASCORBIC ACID) 500 MG tablet Take 500 mg by mouth daily.    [provider]      Allergies    Sulfonamide derivatives    Review of  Systems   Review of Systems  Constitutional:  Positive for fatigue. Negative for chills and fever.  HENT:  Negative for congestion.   Respiratory:  Negative for cough, chest tightness, shortness of breath and wheezing.   Cardiovascular:  Positive for leg swelling. Negative for chest pain and palpitations.  Gastrointestinal:  Negative for abdominal pain, constipation, diarrhea and nausea.  Genitourinary:  Negative for dysuria and flank pain.  Musculoskeletal:  Negative for back pain and neck stiffness.  Skin:  Positive for wound.  Neurological:  Negative for light-headedness and headaches.  Psychiatric/Behavioral:  Negative for agitation.   All other systems reviewed and are  negative.   Physical Exam Updated Vital Signs BP 126/80 (BP Location: Left Arm)   Pulse (!) 117   Temp 97.9 F (36.6 C) (Oral)   Resp 16   Ht 5\' 6"  (1.676 m)   Wt 66.4 kg   SpO2 100%   BMI 23.63 kg/m  Physical Exam Vitals and nursing note reviewed.  Constitutional:      General: He is not in acute distress.    Appearance: He is well-developed. He is not ill-appearing or diaphoretic.  HENT:     Head: Normocephalic and atraumatic.     Nose: Nose normal.     Mouth/Throat:     Mouth: Mucous membranes are moist.     Pharynx: No oropharyngeal exudate or posterior oropharyngeal erythema.  Eyes:     Extraocular Movements: Extraocular movements intact.     Conjunctiva/sclera: Conjunctivae normal.     Pupils: Pupils are equal, round, and reactive to light.  Cardiovascular:     Rate and Rhythm: Normal rate and regular rhythm.     Heart sounds: No murmur heard. Pulmonary:     Effort: Pulmonary effort is normal. No respiratory distress.     Breath sounds: Normal breath sounds. No wheezing, rhonchi or rales.  Chest:     Chest wall: No tenderness.  Abdominal:     Palpations: Abdomen is soft.     Tenderness: There is no abdominal tenderness. There is no guarding or rebound.  Musculoskeletal:        General: Tenderness present. No swelling.     Cervical back: Neck supple. No tenderness.     Right lower leg: Edema present.     Left lower leg: Edema present.  Skin:    General: Skin is warm and dry.     Capillary Refill: Capillary refill takes less than 2 seconds.     Findings: Erythema present.  Neurological:     General: No focal deficit present.     Mental Status: He is alert.     Sensory: No sensory deficit.     Motor: No weakness.  Psychiatric:        Mood and Affect: Mood normal.     ED Results / Procedures / Treatments   Labs (all labs ordered are listed, but only abnormal results are displayed) Labs Reviewed  COMPREHENSIVE METABOLIC PANEL - Abnormal; Notable for  the following components:      Result Value   Sodium 122 (*)    Chloride 88 (*)    Glucose, Bld 106 (*)    BUN 32 (*)    Calcium 8.4 (*)    Total Protein 6.3 (*)    Albumin 3.4 (*)    Total Bilirubin 1.3 (*)    GFR, Estimated 56 (*)    All other components within normal limits  CBC WITH DIFFERENTIAL/PLATELET - Abnormal; Notable for the following  components:   WBC 11.6 (*)    Neutro Abs 9.3 (*)    Abs Immature Granulocytes 0.23 (*)    All other components within normal limits  BRAIN NATRIURETIC PEPTIDE - Abnormal; Notable for the following components:   B Natriuretic Peptide 455.5 (*)    All other components within normal limits  CULTURE, BLOOD (ROUTINE X 2)  CULTURE, BLOOD (ROUTINE X 2)  URINALYSIS, ROUTINE W REFLEX MICROSCOPIC  LACTIC ACID, PLASMA  TSH  SODIUM, URINE, RANDOM  OSMOLALITY, URINE  OSMOLALITY    EKG EKG Interpretation  Date/Time:  Friday May 25 2023 18:56:33 EDT Ventricular Rate:  112 PR Interval:    QRS Duration: 72 QT Interval:  324 QTC Calculation: 442 R Axis:   60 Text Interpretation: Atrial fibrillation with rapid ventricular response Abnormal ECG When compared with ECG of 22-Aug-2022 18:48, PREVIOUS ECG IS PRESENT when compared to prior, slightly faster afib. No STEMI Confirmed by Theda Belfast ([id number]) on 05/25/2023 7:02:11 PM  Radiology DG Chest 2 View  Result Date: 05/25/2023 CLINICAL DATA:  Extremity swelling EXAM: CHEST - 2 VIEW COMPARISON:  Chest x-ray dated May 21st 2024 FINDINGS: The heart size and mediastinal contours are within normal limits. Unchanged elevation of the right hemidiaphragm. Both lungs are clear. The visualized skeletal structures are unremarkable. IMPRESSION: No active cardiopulmonary disease. Electronically Signed   By: Allegra Lai M.D.   On: 05/25/2023 19:36   US Venous Img Lower Unilateral Right (DVT)  Result Date: 05/25/2023 CLINICAL DATA:  Right calf swelling and weeping for the past week. History of prostate  cancer. Evaluate for DVT. EXAM: RIGHT LOWER EXTREMITY VENOUS DOPPLER ULTRASOUND TECHNIQUE: Gray-scale sonography with graded compression, as well as color Doppler and duplex ultrasound were performed to evaluate the lower extremity deep venous systems from the level of the common femoral vein and including the common femoral, femoral, profunda femoral, popliteal and calf veins including the posterior tibial, peroneal and gastrocnemius veins when visible. The superficial great saphenous vein was also interrogated. Spectral Doppler was utilized to evaluate flow at rest and with distal augmentation maneuvers in the common femoral, femoral and popliteal veins. COMPARISON:  None Available. FINDINGS: Contralateral Common Femoral Vein: Respiratory phasicity is normal and symmetric with the symptomatic side. No evidence of thrombus. Normal compressibility. Common Femoral Vein: No evidence of thrombus. Normal compressibility, respiratory phasicity and response to augmentation. Saphenofemoral Junction: No evidence of thrombus. Normal compressibility and flow on color Doppler imaging. Profunda Femoral Vein: No evidence of thrombus. Normal compressibility and flow on color Doppler imaging. Femoral Vein: No evidence of thrombus. Normal compressibility, respiratory phasicity and response to augmentation. Popliteal Vein: No evidence of thrombus. Normal compressibility, respiratory phasicity and response to augmentation. Calf Veins: No evidence of thrombus. Normal compressibility and flow on color Doppler imaging. Superficial Great Saphenous Vein: No evidence of thrombus. Normal compressibility. Other Findings: Note is made of a mildly prominent though non pathologically enlarged right inguinal lymph node which measures 0.7 cm in diameter, presumably reactive in etiology. Sonographic evaluation of patient's area of discomfort involving the medial aspect of the right mid thigh correlates with an indeterminate 0.7 x 0.6 x 0.5 cm  minimally complex subcutaneous cystic nodule (images 32 through 38). This nodule does not demonstrate communication with the overlying dermal surface. No blood flow is demonstrated within this nodule. This nodule does not appear to communicate with adjacent vascular structures (as confirmed on cine series 2) Note is made of a 4.4 x 3.2 x 1.0 cm minimally complex right-sided  Baker's cyst. There is a moderate-to-large amount of subcutaneous edema at the level of the right lower leg and calf. IMPRESSION: 1. No evidence of DVT within right lower extremity. 2. Sonographic evaluation of the patient's area of discomfort involving medial aspect of the right mid thigh correlates with a 0.7 cm minimally complex subcutaneous cystic nodule, indeterminate and of doubtful clinical significance with differential considerations including a hematoma versus seroma or abscess. 3. Note made of a 4.4 cm minimally complex right-sided Baker's cyst. Electronically Signed   By: Simonne Come M.D.   On: 05/25/2023 13:29    Procedures Procedures    Medications Ordered in ED Medications - No data to display  ED Course/ Medical Decision Making/ A&P                             Medical Decision Making Amount and/or Complexity of Data Reviewed Labs: ordered. Radiology: ordered.  Risk Decision regarding hospitalization.    Kedar Eisley. is a 89 y.o. male with a past medical history significant for hypertension, hyperlipidemia, previous prostate cancer, atrial fibrillation on Eliquis therapy, CHF, recent diagnosis of alpha gal from a tick bite, and currently getting treated for cellulitis of his right leg who presents at the direction of his PCP for worsened peripheral edema in extremities, fatigue, hyponatremia, and continued infection.  According to patient and friend, patient has had the diagnosis of alpha gal from a tick bite from several weeks ago.  Patient started having rash in the right leg that was initially treated  with antibiotics.  Was started on doxycycline and then when it did not improve after several days was transition to Keflex which she has been on for 4 days.  The swelling and rash has been persistent and PCP was going to start Levaquin but when labs return from today showing worsening hyponatremia with a sodium down into the low 120s, patient was sent in for evaluation.  Patient reports worsening fatigue and feeling ill over the last week and peripheral edema is worsening.  Initially was in the legs and now is in both legs and bilateral hands.  Patient is not having significant pain in the chest and is not having shortness of breath, just feeling tired.  Patient is denying any fevers, chills, congestion, cough.  Denies nausea, vomiting, constipation, or diarrhea.  Denies urinary changes.  On exam, patient has significant pitting edema in both legs.  He has weeping rash on his right leg that has been wrapped.  Patient reports it has not responded to doxycycline or Keflex.  Has not yet taken the Levaquin prescribed today.  Patient has edema in both hands that are shiny and swollen.  He did not have rales on my exam and his chest and abdomen nontender.  Lungs were clear.  Patient had labs that indeed showed hyponatremia with a sodium of 122.  It was normal 2 months ago and even a week ago was higher.  Kidney function is 1.21.  Patient has a mild leukocytosis but has no anemia.  Patient has normal platelets as well.  Will add on a BNP given his heart failure and worsening edema.  Will get a lactic acid and will also get blood cultures.  Patient's albumin is slightly decreased at 3.4 but it is not significantly low.  With the patient's heart failure, worsening edema, dropping sodium, do not want to follow him with fluids.  I think he is having  degree of symptomatic hyponatremia.  Unclear if this is related to the tickborne illness or the cellulitis however with the worsened infection as well, I think he will  need antibiotics.  Will discuss with admitting team if they want Levaquin or want a different antibiotic.  He will need to be admitted for fluid monitoring, safe diuresis and electrolyte monitoring, and further treatment of the cellulitis.         Final Clinical Impression(s) / ED Diagnoses Final diagnoses:  Hyponatremia  Peripheral edema  Fatigue, unspecified type  Cellulitis, unspecified cellulitis site    Clinical Impression: 1. Hyponatremia   2. Peripheral edema   3. Fatigue, unspecified type   4. Cellulitis, unspecified cellulitis site     Disposition: Admit  This note was prepared with assistance of Dragon voice recognition software. Occasional wrong-word or sound-a-like substitutions may have occurred due to the inherent limitations of voice recognition software.     Kamalani Mastro, Canary Brim, MD 05/25/23 2227

## 2023-05-25 NOTE — Progress Notes (Signed)
This is a 89 year old male with past medical history of chronic diastolic heart failure, permanent atrial fibrillation, CKD stage III, HLD, HTN, inferior mesenteric vein thrombosis, nonrheumatic mitral valve regurg, and prostate cancer. Patient seen by PCP 05/15/2023. Patient seen for tick bite in the right axilla and a sore throat.  Physical exam at the time bull's-eye rash in the right axilla.  Red flat lesions on legs and abdomen.  Bilateral lower extremity edema noted. He was treated with doxycycline for 10 days and Magic mouthwash with lidocaine.  Labs ordered included Alpha gal panel, Lyme disease, [place] spotted fever sent off.  Patient returned to the office on 05/22/2023 with complaints of worsening rash on the right lower extremity and edema.  He also complains of low energy and fatigue.  Examination showed improvement of the abdominal rash.  However left lower extremity had erythema with blistering, swelling, pitting edema from the left ankle to the bottom of the knee.  Patient also has right calf pain with palpitation.  Wound cultures were collected.  Patient's antibiotic changed to Keflex 4 times daily and Doppler ultrasound ordered right lower extremity.  [facility] spotted fever and Lyme disease were negative.  Alpha gal panel was positive which reactions to beef, and pork. Doppler ultrasound negative for DVT.  Patient presents to [facility] with worsening rash on legs and arm and now generalized edema. Patient's sodium has been gradually trending down since labs done on 05/15/2023. Initial sodium 129.  Today sodium 122.  BNP mildly elevated at 455.  Patient requested. Repeat Lyme disease ordered.  TSH, urine and plasma osmolality, spot urine sodium ordered.  2D echo ordered

## 2023-05-25 NOTE — ED Notes (Signed)
Dr. Rush Landmark aware of patient's HR Afib 110s-120s. No new orders at this time.

## 2023-05-25 NOTE — Telephone Encounter (Signed)
Sent referral per provider request

## 2023-05-25 NOTE — ED Triage Notes (Signed)
Pt was told he has a low sodium level.  Pt states he has increased swelling to hands and both legs x 2 weeks.  Pt has been seen by PCP recently and was diagnosed AlphaGal from tick bite.

## 2023-05-25 NOTE — Telephone Encounter (Signed)
Pt notes he is not sure about how he wants to proceed would like you to make the decision on what is best notes he is having a lot of difficulty making these decisions now in his age

## 2023-05-26 ENCOUNTER — Encounter (HOSPITAL_COMMUNITY): Payer: Self-pay | Admitting: Family Medicine

## 2023-05-26 ENCOUNTER — Inpatient Hospital Stay (HOSPITAL_COMMUNITY): Payer: Medicare Other

## 2023-05-26 DIAGNOSIS — L03115 Cellulitis of right lower limb: Secondary | ICD-10-CM

## 2023-05-26 DIAGNOSIS — Z8546 Personal history of malignant neoplasm of prostate: Secondary | ICD-10-CM | POA: Diagnosis not present

## 2023-05-26 DIAGNOSIS — E785 Hyperlipidemia, unspecified: Secondary | ICD-10-CM | POA: Diagnosis present

## 2023-05-26 DIAGNOSIS — R0609 Other forms of dyspnea: Secondary | ICD-10-CM | POA: Diagnosis not present

## 2023-05-26 DIAGNOSIS — Z79899 Other long term (current) drug therapy: Secondary | ICD-10-CM | POA: Diagnosis not present

## 2023-05-26 DIAGNOSIS — Z820 Family history of epilepsy and other diseases of the nervous system: Secondary | ICD-10-CM | POA: Diagnosis not present

## 2023-05-26 DIAGNOSIS — N1831 Chronic kidney disease, stage 3a: Secondary | ICD-10-CM | POA: Diagnosis present

## 2023-05-26 DIAGNOSIS — I4821 Permanent atrial fibrillation: Secondary | ICD-10-CM | POA: Diagnosis present

## 2023-05-26 DIAGNOSIS — I10 Essential (primary) hypertension: Secondary | ICD-10-CM | POA: Diagnosis not present

## 2023-05-26 DIAGNOSIS — Z800 Family history of malignant neoplasm of digestive organs: Secondary | ICD-10-CM | POA: Diagnosis not present

## 2023-05-26 DIAGNOSIS — I5033 Acute on chronic diastolic (congestive) heart failure: Secondary | ICD-10-CM | POA: Diagnosis present

## 2023-05-26 DIAGNOSIS — I130 Hypertensive heart and chronic kidney disease with heart failure and stage 1 through stage 4 chronic kidney disease, or unspecified chronic kidney disease: Secondary | ICD-10-CM | POA: Diagnosis present

## 2023-05-26 DIAGNOSIS — Z7901 Long term (current) use of anticoagulants: Secondary | ICD-10-CM | POA: Diagnosis not present

## 2023-05-26 DIAGNOSIS — I4819 Other persistent atrial fibrillation: Secondary | ICD-10-CM

## 2023-05-26 DIAGNOSIS — Z818 Family history of other mental and behavioral disorders: Secondary | ICD-10-CM | POA: Diagnosis not present

## 2023-05-26 DIAGNOSIS — E871 Hypo-osmolality and hyponatremia: Secondary | ICD-10-CM | POA: Diagnosis present

## 2023-05-26 LAB — BASIC METABOLIC PANEL
Anion gap: 11 (ref 5–15)
Anion gap: 10 (ref 5–15)
Anion gap: 9 (ref 5–15)
BUN: 28 mg/dL — ABNORMAL HIGH (ref 8–23)
BUN: 27 mg/dL — ABNORMAL HIGH (ref 8–23)
BUN: 28 mg/dL — ABNORMAL HIGH (ref 8–23)
CO2: 26 mmol/L (ref 22–32)
CO2: 25 mmol/L (ref 22–32)
CO2: 25 mmol/L (ref 22–32)
Calcium: 8.6 mg/dL — ABNORMAL LOW (ref 8.9–10.3)
Calcium: 8.4 mg/dL — ABNORMAL LOW (ref 8.9–10.3)
Calcium: 8.4 mg/dL — ABNORMAL LOW (ref 8.9–10.3)
Chloride: 92 mmol/L — ABNORMAL LOW (ref 98–111)
Chloride: 92 mmol/L — ABNORMAL LOW (ref 98–111)
Chloride: 93 mmol/L — ABNORMAL LOW (ref 98–111)
Creatinine, Ser: 1.19 mg/dL (ref 0.61–1.24)
Creatinine, Ser: 1.22 mg/dL (ref 0.61–1.24)
Creatinine, Ser: 1.33 mg/dL — ABNORMAL HIGH (ref 0.61–1.24)
GFR, Estimated: 57 mL/min — ABNORMAL LOW (ref >60)
GFR, Estimated: 56 mL/min — ABNORMAL LOW (ref >60)
GFR, Estimated: 50 mL/min — ABNORMAL LOW (ref >60)
Glucose, Bld: 90 mg/dL (ref 70–99)
Glucose, Bld: 90 mg/dL (ref 70–99)
Glucose, Bld: 90 mg/dL (ref 70–99)
Potassium: 4.3 mmol/L (ref 3.5–5.1)
Potassium: 4.1 mmol/L (ref 3.5–5.1)
Potassium: 4.4 mmol/L (ref 3.5–5.1)
Sodium: 129 mmol/L — ABNORMAL LOW (ref 135–145)
Sodium: 127 mmol/L — ABNORMAL LOW (ref 135–145)
Sodium: 127 mmol/L — ABNORMAL LOW (ref 135–145)

## 2023-05-26 LAB — CBC
HCT: 38.5 % — ABNORMAL LOW (ref 39.0–52.0)
Hemoglobin: 13.5 g/dL (ref 13.0–17.0)
MCH: 31.7 pg (ref 26.0–34.0)
MCHC: 35.1 g/dL (ref 30.0–36.0)
MCV: 90.4 fL (ref 80.0–100.0)
Platelets: 242 10*3/uL (ref 150–400)
RBC: 4.26 MIL/uL (ref 4.22–5.81)
RDW: 13.7 % (ref 11.5–15.5)
WBC: 8.3 10*3/uL (ref 4.0–10.5)
nRBC: 0 % (ref 0.0–0.2)

## 2023-05-26 LAB — WOUND CULTURE
MICRO NUMBER:: 15008552
RESULT:: NO GROWTH
SPECIMEN QUALITY:: ADEQUATE

## 2023-05-26 LAB — ECHOCARDIOGRAM COMPLETE
AR max vel: 1.41 cm2
AV Area VTI: 1.5 cm2
AV Area mean vel: 1.34 cm2
AV Mean grad: 6.4 mmHg
AV Peak grad: 11.5 mmHg
Ao pk vel: 1.69 m/s
Height: 66 in
MV M vel: 4.7 m/s
MV Peak grad: 88.4 mmHg
Radius: 0.7 cm
S' Lateral: 2.4 cm
Weight: 2342.41 oz

## 2023-05-26 LAB — SODIUM, URINE, RANDOM: Sodium, Ur: 14 mmol/L

## 2023-05-26 LAB — OSMOLALITY: Osmolality: 275 mOsm/kg (ref 275–295)

## 2023-05-26 LAB — CULTURE, BLOOD (ROUTINE X 2)

## 2023-05-26 LAB — OSMOLALITY, URINE: Osmolality, Ur: 431 mOsm/kg (ref 300–900)

## 2023-05-26 LAB — TSH: TSH: 1.498 u[IU]/mL (ref 0.350–4.500)

## 2023-05-26 MED ORDER — CLORAZEPATE DIPOTASSIUM 3.75 MG PO TABS
15.0000 mg | ORAL_TABLET | Freq: Every day | ORAL | Status: DC
  Administered 2023-05-26 – 2023-05-28 (×3): 15 mg via ORAL
  Filled 2023-05-26 (×3): qty 4

## 2023-05-26 MED ORDER — SIMVASTATIN 20 MG PO TABS
10.0000 mg | ORAL_TABLET | Freq: Every day | ORAL | Status: DC
  Administered 2023-05-26 – 2023-05-28 (×3): 10 mg via ORAL
  Filled 2023-05-26 (×3): qty 1

## 2023-05-26 MED ORDER — TAMSULOSIN HCL 0.4 MG PO CAPS
0.4000 mg | ORAL_CAPSULE | Freq: Every day | ORAL | Status: DC
  Administered 2023-05-26 – 2023-05-28 (×3): 0.4 mg via ORAL
  Filled 2023-05-26 (×3): qty 1

## 2023-05-26 MED ORDER — METOPROLOL SUCCINATE ER 25 MG PO TB24: 12.5000 mg | ORAL_TABLET | Freq: Every day | ORAL | Status: DC

## 2023-05-26 MED ORDER — ACETAMINOPHEN 650 MG RE SUPP: 650.0000 mg | Freq: Four times a day (QID) | RECTAL | Status: DC | PRN

## 2023-05-26 MED ORDER — AMOXICILLIN-POT CLAVULANATE 875-125 MG PO TABS
1.0000 | ORAL_TABLET | Freq: Two times a day (BID) | ORAL | Status: DC
  Filled 2023-05-26: qty 1

## 2023-05-26 MED ORDER — SODIUM CHLORIDE 0.9 % IV SOLN
1.0000 g | INTRAVENOUS | Status: DC
  Administered 2023-05-26 – 2023-05-27 (×2): 1 g via INTRAVENOUS
  Filled 2023-05-26 (×2): qty 10

## 2023-05-26 MED ORDER — APIXABAN 5 MG PO TABS
5.0000 mg | ORAL_TABLET | Freq: Two times a day (BID) | ORAL | Status: DC
  Administered 2023-05-26 – 2023-05-29 (×7): 5 mg via ORAL
  Filled 2023-05-26 (×7): qty 1

## 2023-05-26 MED ORDER — FUROSEMIDE 10 MG/ML IJ SOLN
60.0000 mg | Freq: Two times a day (BID) | INTRAMUSCULAR | Status: DC
  Administered 2023-05-26 – 2023-05-28 (×4): 60 mg via INTRAVENOUS
  Filled 2023-05-26 (×5): qty 6

## 2023-05-26 MED ORDER — SODIUM CHLORIDE 0.9% FLUSH
3.0000 mL | Freq: Two times a day (BID) | INTRAVENOUS | Status: DC
  Administered 2023-05-26 – 2023-05-29 (×8): 3 mL via INTRAVENOUS

## 2023-05-26 MED ORDER — LATANOPROST 0.005 % OP SOLN
1.0000 [drp] | Freq: Every day | OPHTHALMIC | Status: DC
  Administered 2023-05-26 – 2023-05-28 (×3): 1 [drp] via OPHTHALMIC
  Filled 2023-05-26: qty 2.5

## 2023-05-26 MED ORDER — ONDANSETRON HCL 4 MG/2ML IJ SOLN: 4.0000 mg | Freq: Four times a day (QID) | INTRAMUSCULAR | Status: DC | PRN

## 2023-05-26 MED ORDER — METOPROLOL SUCCINATE ER 25 MG PO TB24
12.5000 mg | ORAL_TABLET | Freq: Every day | ORAL | Status: DC
  Administered 2023-05-26 – 2023-05-29 (×5): 12.5 mg via ORAL
  Filled 2023-05-26 (×5): qty 1

## 2023-05-26 MED ORDER — SPIRONOLACTONE 12.5 MG HALF TABLET
12.5000 mg | ORAL_TABLET | Freq: Every day | ORAL | Status: DC
  Administered 2023-05-26 – 2023-05-29 (×4): 12.5 mg via ORAL
  Filled 2023-05-26 (×4): qty 1

## 2023-05-26 MED ORDER — FUROSEMIDE 10 MG/ML IJ SOLN
20.0000 mg | Freq: Two times a day (BID) | INTRAMUSCULAR | Status: DC
  Administered 2023-05-26: 20 mg via INTRAVENOUS
  Filled 2023-05-26: qty 2

## 2023-05-26 MED ORDER — ONDANSETRON HCL 4 MG PO TABS: 4.0000 mg | ORAL_TABLET | Freq: Four times a day (QID) | ORAL | Status: DC | PRN

## 2023-05-26 MED ORDER — ACETAMINOPHEN 325 MG PO TABS
650.0000 mg | ORAL_TABLET | Freq: Four times a day (QID) | ORAL | Status: DC | PRN
  Administered 2023-05-26 – 2023-05-28 (×3): 650 mg via ORAL
  Filled 2023-05-26 (×3): qty 2

## 2023-05-26 NOTE — Assessment & Plan Note (Addendum)
Echocardiogram with preserved LV systolic function EF 70 to 75%, no LVH, RV with preserved systolic function, RVSP 43.4 mmHg, LA with severe dilatation, RA with moderate dilatation, severe mitral regurgitation, moderate aortic regurgitation.   Urine output is 3,750  ml  Systolic blood pressure 93 to 161 mmHg.  Continue with spironolactone and will add SGLT inh Transition to po furosemide in am.  Continue metoprolol for rate control atrial fibrillation. Out of bed to the chair and continue to encourage mobility.

## 2023-05-26 NOTE — Hospital Course (Addendum)
Mr. Geimer was admitted to the hospital with the working diagnosis of hyponatremia and right lower extremity cellulitis. Heart failure exacerbation.   89 yo male with the past medical history of hypertension, dyslipidemia, atrial fibrillation, heart failure and CKD who presented with  lower extremity edema. Reported 2 weeks of worsening lower extremity edema bilaterally, associated with erythema. As outpatient he was treated with doxycycline and cephalexin by his primary care provider. Blood work showed hyponatremia and he was referred to the ED for further evaluation.   Na 122, K 4,5 Cl 88, bicarbonate 25, glucose 106 bun 32, cr 1.21 BNP 455  Wbc 11.6 hgb 14.4 plt 278   Urine analysis SG 1,015, negative protein, negative leukocytes and negative Hgb. pH 7,0  Urine osmolality 431, urine Na 14.   Chest radiograph with right hemidiaphragm elevation with no effusions or infiltrates.   EKG 112 bpm, normal axis, normal intervals, atrial fibrillation rhythm with no significant ST segment or T wave changes.   Patient was placed on furosemide for diuresis.   06/02 responding well to diuresis. Change antibiotic therapy to oral cephalexin.  06/03 patient is euvolemic today, plan for possible transfer home tomorrow.

## 2023-05-26 NOTE — Progress Notes (Addendum)
Progress Note   Patient: Mark Stark. ZOX:[id number] DOB: 1928-04-27 DOA: 05/25/2023     0 DOS: the patient was seen and examined on 05/26/2023   Brief hospital course: Mr. Hosbach was admitted to the hospital with the working diagnosis of hyponatremia and right lower extremity cellulitis. Heart failure exacerbation.   89 yo male with the past medical history of hypertension, dyslipidemia, atrial fibrillation, heart failure and CKD who presented with  lower extremity edema. Reported 2 weeks of worsening lower extremity edema bilaterally, associated with erythema. As outpatient he was treated with doxycycline and cephalexin by his primary care provider. Blood work showed hyponatremia and he was referred to the ED for further evaluation.   Na 122, K 4,5 Cl 88, bicarbonate 25, glucose 106 bun 32, cr 1.21 BNP 455  Wbc 11.6 hgb 14.4 plt 278   Urine analysis SG 1,015, negative protein, negative leukocytes and negative Hgb. pH 7,0  Urine osmolality 431, urine Na 14.   Chest radiograph with right hemidiaphragm elevation with no effusions or infiltrates.   EKG 112 bpm, normal axis, normal intervals, atrial fibrillation rhythm with no significant ST segment or T wave changes.   Patient was placed on furosemide for diuresis.   Assessment and Plan: * Acute on chronic diastolic CHF (congestive heart failure) (HCC) 07/2022 echocardiogram with preserved LV systolic function  EF 60 to 65%, mild LVH. RV with preserved systolic function. LA with severe dilatation, severe mitral regurgitation, mild TR, mild aortic stenosis, mild to moderate aortic regurgitation.   Patient continue to have edema. Urine output is 1,100 ml  Systolic blood pressure 116 to 100 mmHg.  Increase furosemide to 60 mg IV q12 hrs Add spironolactone. No SGLT 2 inh due to cellulitis.  Continue metoprolol for rate control atrial fibrillation.   Cellulitis of right lower extremity Right lower extremity cellulitis. Right leg with  more edema than left.  Plan to continue therapy with ceftriaxone.   Persistent atrial fibrillation (HCC) Continue rate control with metoprolol and anticoagulation with apixaban.  Continue telemetry monitoring.   Chronic kidney disease, stage 3a (HCC) Hyponatremia.   High urine SG and high urine osmolality with low urinary Na. Follow up Na continue to be low at 127 with K at 4,4 and serum bicarbonate at 25. Serum cr is 1,33. Continue patient in edematous state.  Plan to increase dose of furosemide to 60 mg IV q12 hrs. Add spironolactone.    Essential hypertension Continue blood pressure monitoring. Aggressive diuresis and metoprolol.   Hyperlipidemia Continue with statin therapy.        Subjective: Patient with no chest pain, continue to have edema lower extremities, no chest pain  Physical Exam: Vitals:   05/26/23 0407 05/26/23 0907 05/26/23 0924 05/26/23 1244  BP: 117/77 100/65 116/79 100/75  Pulse: (!) 110 (!) 116 (!) 111   Resp: 19 20  20   Temp: (!) 97.5 F (36.4 C) 97.6 F (36.4 C)  97.6 F (36.4 C)  TempSrc:    Oral  SpO2: 100% 99%    Weight:      Height:       Neurology awake and alert ENT with mild pallor Cardiovascular with S1 and S2 present, irregularly irregular with no gallops, rubs or murmurs Mild JVD Positive lower extremity edema +++ pitting more right than left Respiratory with mild rales at bases with no wheezing or rhonchi Abdomen with no distention  Data Reviewed:    Family Communication: no family at the bedside   Disposition:  Status is: Inpatient Remains inpatient appropriate because: IV diuresis   Planned Discharge Destination: Home    Author: Coralie Keens, MD 05/26/2023 3:22 PM  For on call review [email or web address].

## 2023-05-26 NOTE — Progress Notes (Signed)
   05/26/23 0233  Assess: MEWS Score  Temp 97.8 F (36.6 C)  BP (!) 116/100  MAP (mmHg) 106  Pulse Rate (!) 131  Resp 18  Level of Consciousness Alert  SpO2 99 %  O2 Device Room Air  Assess: MEWS Score  MEWS Temp 0  MEWS Systolic 0  MEWS Pulse 3  MEWS RR 0  MEWS LOC 0  MEWS Score 3  MEWS Score Color Yellow  Assess: if the MEWS score is Yellow or Red  Were vital signs taken at a resting state? Yes  Focused Assessment No change from prior assessment  Does the patient meet 2 or more of the SIRS criteria? No  MEWS guidelines implemented  Yes, yellow  Treat  MEWS Interventions Considered administering scheduled or prn medications/treatments as ordered  Take Vital Signs  Increase Vital Sign Frequency  Yellow: Q2hr x1, continue Q4hrs until patient remains green for 12hrs  Escalate  MEWS: Escalate Yellow: Discuss with charge nurse and consider notifying provider and/or RRT  Notify: Charge Nurse/RN  Name of Charge Nurse/RN Notified Alona Bene, RN  Provider Notification  Provider Name/Title Lavone Neri. Opyd, MD  Date Provider Notified 05/26/23  Time Provider Notified 0300  Method of Notification Face-to-face  Notification Reason Other (Comment) (Yellow MEWS due to HR 131)  Provider response At bedside  Date of Provider Response 05/26/23  Time of Provider Response 0315  Assess: SIRS CRITERIA  SIRS Temperature  0  SIRS Pulse 1  SIRS Respirations  0  SIRS WBC 0  SIRS Score Sum  1

## 2023-05-26 NOTE — Progress Notes (Signed)
  Echocardiogram 2D Echocardiogram has been performed.  Delcie Roch 05/26/2023, 5:37 PM

## 2023-05-26 NOTE — Assessment & Plan Note (Addendum)
New diagnosis 1 month ago.  Remains in atrial fibrillation with controlled rate.  Patient has elected against anticoagulation due to history of epistaxis.  CHA2DS2-VASc score is at least 4 suggesting a 4.8% yearly stroke risk.  Risk versus benefits discussed with patient.  Recommended he discuss further with his cardiology team. -Continue Toprol-XL -Continue discussions regarding anticoagulation 

## 2023-05-26 NOTE — H&P (Signed)
History and Physical    Mark Stark. ZOX:[id number] DOB: [birth year] DOA: 05/25/2023  PCP: Donato Schultz, DO   Patient coming from: Home   Chief Complaint: Low sodium, b/l hand and leg swelling, right lower leg redness and swelling  HPI: Mark Stark. is a pleasant 89 y.o. male with medical history significant for hypertension, hyperlipidemia, atrial fibrillation on Eliquis, chronic HFpEF, and [name] who presents to the emergency department for evaluation of low sodium, swelling, and right lower extremity redness.  Patient has had 2 weeks of increased swelling in his bilateral lower extremities, more recent swelling involving bilateral hands, and right lower leg redness.  He was treated with doxycycline recently, continue to have lower right leg erythema and was started on Keflex a few days ago.  He had follow-up with his PCP, blood work was obtained, and he was referred to the ED for hyponatremia.  He has not had headaches, nausea, or confusion associated with this.    [facility] ED Course: Upon arrival to the ED, patient is found to be afebrile and saturating well on room air with elevated heart rate and stable blood pressure.  EKG demonstrates atrial fibrillation with rate 112.  Chest x-ray is negative for acute cardiopulmonary disease.  Labs are most notable for sodium 122, creatinine 1.21, WBC 11,600, and BNP 456.  Blood cultures were collected in the emergency department and the patient was transferred to [facility] for admission.  Review of Systems:  All other systems reviewed and apart from HPI, are negative.  Past Medical History:  Diagnosis Date   Acute diastolic CHF (congestive heart failure) (HCC) 10/25/2021   Anxiety    Aortic insufficiency    Atrial fibrillation (HCC)    Chronic diastolic CHF (congestive heart failure) (HCC)    Chronic kidney disease, stage 3a (HCC)    Diverticulitis    Dyslipidemia    Hypertension    Inferior mesenteric vein thrombosis  (HCC)    Mitral regurgitation    Nonrheumatic mitral valve regurgitation 10/28/2021   Paroxysmal atrial fibrillation (HCC) 01/12/2018   Persistent atrial fibrillation (HCC)    Prostate cancer [facility])     Past Surgical History:  Procedure Laterality Date   CARDIOVERSION N/A 03/01/2018   Procedure: CARDIOVERSION;  Surgeon: Lennette Bihari, MD;  Location: [facility] ENDOSCOPY;  Service: Cardiovascular;  Laterality: N/A;   CARDIOVERSION N/A 08/26/2021   Procedure: CARDIOVERSION;  Surgeon: Chrystie Nose, MD;  Location: [facility] ENDOSCOPY;  Service: Cardiovascular;  Laterality: N/A;   CARDIOVERSION N/A 12/06/2021   Procedure: CARDIOVERSION;  Surgeon: Little Ishikawa, MD;  Location: [facility] ENDOSCOPY;  Service: Cardiovascular;  Laterality: N/A;   CATARACT EXTRACTION  01/27/2010   right   CATARACT EXTRACTION  03/04/2010   left   Prostate seed implants     PROSTATE SURGERY      Social History:   reports that he has never smoked. He has never used smokeless tobacco. He reports that he does not drink alcohol and does not use drugs.  Allergies  Allergen Reactions   Sulfonamide Derivatives Other (See Comments)    Unknown - patient cannot remember reaction    Family History  Problem Relation Age of Onset   Dementia Mother    Alzheimer's disease Mother    Mental illness Mother        ALZ DISEASE   Cancer Brother 67       pancreatic cancer     Prior to Admission medications  Medication Sig Start Date End Date Taking? Authorizing Provider  apixaban (ELIQUIS) 5 MG TABS tablet Take 1 tablet (5 mg total) by mouth 2 (two) times daily. 11/15/22   Seabron Spates R, DO  b complex vitamins tablet Take 1 tablet by mouth daily.    [provider]  Calcium Carbonate-Vitamin D 600-400 MG-UNIT tablet Take 1 tablet by mouth daily.    [provider]  clorazepate (TRANXENE) 15 MG tablet TAKE 1 TABLET BY MOUTH EVERYDAY AT BEDTIME 05/07/23   Zola Button, Grayling Congress, DO  cromolyn (NASALCROM) 5.2  MG/ACT nasal spray Place 2 sprays into the nose 2 (two) times daily.    [provider]  doxycycline (VIBRA-TABS) 100 MG tablet Take 1 tablet (100 mg total) by mouth 2 (two) times daily. Patient not taking: Reported on 05/25/2023 05/15/23   Zola Button, Myrene Buddy R, DO  fenofibrate 160 MG tablet TAKE 1 TABLET BY MOUTH EVERY DAY 04/19/23   Zola Button, Grayling Congress, DO  fish oil-omega-3 fatty acids 1000 MG capsule Take 1 g by mouth 3 (three) times daily.    [provider]  latanoprost (XALATAN) 0.005 % ophthalmic solution Place 1 drop into the left eye at bedtime. 07/24/22   [provider]  levofloxacin (LEVAQUIN) 500 MG tablet Take 1 tablet (500 mg total) by mouth daily for 7 days. 05/25/23 06/01/23  Donato Schultz, DO  magic mouthwash w/lidocaine SOLN Take 5 mLs by mouth 4 (four) times daily as needed for mouth pain. Swish and Spit 05/15/23   Seabron Spates R, DO  metoprolol succinate (TOPROL XL) 25 MG 24 hr tablet Take 0.5 tablets (12.5 mg total) by mouth daily. 03/13/22   Jodelle Red, MD  Multiple Vitamins-Minerals (MULTIVITAMIN PO) Take 1 tablet by mouth daily.    [provider]  Multiple Vitamins-Minerals (PRESERVISION AREDS 2) CAPS 2 po qd Patient taking differently: Take 1 tablet by mouth in the morning and at bedtime. 07/05/17   Seabron Spates R, DO  potassium chloride (KLOR-CON) 10 MEQ tablet TAKE 1 TABLET BY MOUTH EVERY DAY 12/14/22   Jodelle Red, MD  simvastatin (ZOCOR) 10 MG tablet TAKE 1 TABLET BY MOUTH EVERYDAY AT BEDTIME 01/31/23   Jodelle Red, MD  tamsulosin (FLOMAX) 0.4 MG CAPS capsule Take 0.4 mg by mouth at bedtime. 11/24/21   [provider]  torsemide (DEMADEX) 20 MG tablet TAKE 1 TABLET BY MOUTH EVERY DAY 05/07/23   Jodelle Red, MD  traMADol (ULTRAM) 50 MG tablet Take 1 tablet (50 mg total) by mouth every 8 (eight) hours as needed for up to 5 days. 05/25/23 05/30/23  Donato Schultz, DO   vitamin C (ASCORBIC ACID) 500 MG tablet Take 500 mg by mouth daily.    [provider]    Physical Exam: Vitals:   05/25/23 2300 05/25/23 2325 05/26/23 0018 05/26/23 0233  BP: 112/76   (!) 116/100  Pulse: (!) 116 (!) 113  (!) 131  Resp: 12 13  18   Temp:   97.6 F (36.4 C) 97.8 F (36.6 C)  TempSrc:   Oral Oral  SpO2: 99% 98%  99%  Weight:      Height:         Constitutional: NAD, no pallor or cyanosis  Eyes: PERTLA, lids and conjunctivae normal ENMT: Mucous membranes are moist. Posterior pharynx clear of any exudate or lesions.   Neck: supple, no masses  Respiratory:  no wheezing, no crackles. No accessory muscle use.  Cardiovascular: S1 & S2 heard, regular rate and rhythm. Bilateral hand and lower leg edema.  Abdomen: No distension, no tenderness, soft. Bowel sounds active.  Musculoskeletal: no clubbing / cyanosis. No joint deformity upper and lower extremities.   Skin: Erythema, heat, and edema involving lower right leg. Skin otherwise warm, dry, well-perfused. Neurologic: Gross hearing deficit, CN 2-12 grossly intact otherwise. Moving all extremities. Alert and oriented.  Psychiatric: Calm. Cooperative.    Labs and Imaging on Admission: I have personally reviewed following labs and imaging studies  CBC: Recent Labs  Lab 05/25/23 1217 05/25/23 1853  WBC 9.8 11.6*  NEUTROABS 7.2 9.3*  HGB 14.0 14.4  HCT 42.8 41.9  MCV 92.8 89.1  PLT 297.0 278   Basic Metabolic Panel: Recent Labs  Lab 05/25/23 1217 05/25/23 1853  NA 123* 122*  K 5.2 No hemolysis seen* 4.5  CL 88* 88*  CO2 28 25  GLUCOSE 80 106*  BUN 34* 32*  CREATININE 1.19 1.21  CALCIUM 8.8 8.4*   GFR: Estimated Creatinine Clearance: 35.2 mL/min (by C-G formula based on SCr of 1.21 mg/dL). Liver Function Tests: Recent Labs  Lab 05/25/23 1217 05/25/23 1853  AST 29 37  ALT 23 27  ALKPHOS 54 55  BILITOT 1.3* 1.3*  PROT 6.1 6.3*  ALBUMIN 3.6 3.4*   No results for input(s): "LIPASE",  "AMYLASE" in the last 168 hours. No results for input(s): "AMMONIA" in the last 168 hours. Coagulation Profile: No results for input(s): "INR", "PROTIME" in the last 168 hours. Cardiac Enzymes: No results for input(s): "CKTOTAL", "CKMB", "CKMBINDEX", "TROPONINI" in the last 168 hours. BNP (last 3 results) No results for input(s): "PROBNP" in the last 8760 hours. HbA1C: No results for input(s): "HGBA1C" in the last 72 hours. CBG: No results for input(s): "GLUCAP" in the last 168 hours. Lipid Profile: No results for input(s): "CHOL", "HDL", "LDLCALC", "TRIG", "CHOLHDL", "LDLDIRECT" in the last 72 hours. Thyroid Function Tests: Recent Labs    05/25/23 2232  TSH 1.498   Anemia Panel: Recent Labs    05/25/23 1217  VITAMINB12 699   Urine analysis:    Component Value Date/Time   COLORURINE YELLOW 05/25/2023 2107   APPEARANCEUR CLEAR 05/25/2023 2107   LABSPEC 1.015 05/25/2023 2107   PHURINE 7.0 05/25/2023 2107   GLUCOSEU NEGATIVE 05/25/2023 2107   HGBUR NEGATIVE 05/25/2023 2107   HGBUR negative 09/23/2010 0838   BILIRUBINUR NEGATIVE 05/25/2023 2107   BILIRUBINUR neg 12/21/2015 1426   KETONESUR NEGATIVE 05/25/2023 2107   PROTEINUR NEGATIVE 05/25/2023 2107   UROBILINOGEN 0.2 12/21/2015 1426   UROBILINOGEN negative 09/23/2010 0838   NITRITE NEGATIVE 05/25/2023 2107   LEUKOCYTESUR NEGATIVE 05/25/2023 2107   Sepsis Labs: @LABRCNTIP (procalcitonin:4,lacticidven:4) ) Recent Results (from the past 240 hour(s))  Wound culture     Status: None (Preliminary result)   Collection Time: 05/22/23  3:34 PM   Specimen: Wound  Result Value Ref Range Status   MICRO NUMBER: [id number]  Preliminary   SPECIMEN QUALITY: Adequate  Preliminary   SOURCE: WOUND (SITE NOT SPECIFIED)  Preliminary   STATUS: PRELIMINARY  Preliminary   GRAM STAIN:   Preliminary    No epithelial cells seen No white blood cells seen No organisms seen   RESULT: No growth to date  Preliminary     Radiological Exams on  Admission: DG Chest 2 View  Result Date: 05/25/2023 CLINICAL DATA:  Extremity swelling EXAM: CHEST - 2 VIEW COMPARISON:  Chest x-ray dated May 21st 2024 FINDINGS: The heart size and mediastinal contours  are within normal limits. Unchanged elevation of the right hemidiaphragm. Both lungs are clear. The visualized skeletal structures are unremarkable. IMPRESSION: No active cardiopulmonary disease. Electronically Signed   By: Allegra Lai M.D.   On: 05/25/2023 19:36   US Venous Img Lower Unilateral Right (DVT)  Result Date: 05/25/2023 CLINICAL DATA:  Right calf swelling and weeping for the past week. History of prostate cancer. Evaluate for DVT. EXAM: RIGHT LOWER EXTREMITY VENOUS DOPPLER ULTRASOUND TECHNIQUE: Gray-scale sonography with graded compression, as well as color Doppler and duplex ultrasound were performed to evaluate the lower extremity deep venous systems from the level of the common femoral vein and including the common femoral, femoral, profunda femoral, popliteal and calf veins including the posterior tibial, peroneal and gastrocnemius veins when visible. The superficial great saphenous vein was also interrogated. Spectral Doppler was utilized to evaluate flow at rest and with distal augmentation maneuvers in the common femoral, femoral and popliteal veins. COMPARISON:  None Available. FINDINGS: Contralateral Common Femoral Vein: Respiratory phasicity is normal and symmetric with the symptomatic side. No evidence of thrombus. Normal compressibility. Common Femoral Vein: No evidence of thrombus. Normal compressibility, respiratory phasicity and response to augmentation. Saphenofemoral Junction: No evidence of thrombus. Normal compressibility and flow on color Doppler imaging. Profunda Femoral Vein: No evidence of thrombus. Normal compressibility and flow on color Doppler imaging. Femoral Vein: No evidence of thrombus. Normal compressibility, respiratory phasicity and response to augmentation.  Popliteal Vein: No evidence of thrombus. Normal compressibility, respiratory phasicity and response to augmentation. Calf Veins: No evidence of thrombus. Normal compressibility and flow on color Doppler imaging. Superficial Great Saphenous Vein: No evidence of thrombus. Normal compressibility. Other Findings: Note is made of a mildly prominent though non pathologically enlarged right inguinal lymph node which measures 0.7 cm in diameter, presumably reactive in etiology. Sonographic evaluation of patient's area of discomfort involving the medial aspect of the right mid thigh correlates with an indeterminate 0.7 x 0.6 x 0.5 cm minimally complex subcutaneous cystic nodule (images 32 through 38). This nodule does not demonstrate communication with the overlying dermal surface. No blood flow is demonstrated within this nodule. This nodule does not appear to communicate with adjacent vascular structures (as confirmed on cine series 2) Note is made of a 4.4 x 3.2 x 1.0 cm minimally complex right-sided Baker's cyst. There is a moderate-to-large amount of subcutaneous edema at the level of the right lower leg and calf. IMPRESSION: 1. No evidence of DVT within right lower extremity. 2. Sonographic evaluation of the patient's area of discomfort involving medial aspect of the right mid thigh correlates with a 0.7 cm minimally complex subcutaneous cystic nodule, indeterminate and of doubtful clinical significance with differential considerations including a hematoma versus seroma or abscess. 3. Note made of a 4.4 cm minimally complex right-sided Baker's cyst. Electronically Signed   By: Simonne Come M.D.   On: 05/25/2023 13:29    EKG: Independently reviewed. Atrial fibrillation, rate 112.   Assessment/Plan   1. Hyponatremia  - Serum sodium is 122 on admission in setting of hypervolemia, seems to be asymptomatic  - Restrict free water intake, diurese with IV Lasix, follow serial sodium levels   2. Acute on chronic  diastolic CHF  - Diurese with 20 mg IV Lasix q12h, follow sodium levels closely, monitor wt and I/Os   3. RLE cellulitis  - Nonpurulent, only slightly improved with doxycycline and Keflex  - Start Rocephin   4. Atrial fibrillation  - Continue Eliquis and metoprolol  5. [name]  - SCr is 1.21 on admission, appears close to baseline  - Renally-dose medications, monitor    DVT prophylaxis: Eliquis Code Status: Full  Level of Care: Level of care: Telemetry Medical Family Communication: Friends at bedside  Disposition Plan:  Patient is from: home  Anticipated d/c is to: TBD Anticipated d/c date is: 05/28/23  Patient currently: Pending improved sodium level  Consults called: None  Admission status: Inpatient     Briscoe Deutscher, MD [facility]  05/26/2023, 3:24 AM

## 2023-05-26 NOTE — Assessment & Plan Note (Signed)
Hyponatremia.   Renal function with serum cr at 1,26 with K at 4,0 and serum bicarbonate at 28. Na 132 and Mg 2.1   Patient will continue diuresis with SGLT 2 inh and spironolactone.  Transition to oral furosemide tomorrow.  Follow up renal function in am.

## 2023-05-26 NOTE — Assessment & Plan Note (Signed)
Right lower extremity cellulitis. Right leg with more edema than left.  Plan to continue therapy with ceftriaxone.

## 2023-05-26 NOTE — Assessment & Plan Note (Addendum)
Systolic blood pressure 97 to 161 mmHg.  Plan to continue metoprolol and diuretic therapy.

## 2023-05-26 NOTE — Assessment & Plan Note (Signed)
Continue with statin therapy.  ?

## 2023-05-27 DIAGNOSIS — N1831 Chronic kidney disease, stage 3a: Secondary | ICD-10-CM | POA: Diagnosis not present

## 2023-05-27 DIAGNOSIS — I4819 Other persistent atrial fibrillation: Secondary | ICD-10-CM | POA: Diagnosis not present

## 2023-05-27 DIAGNOSIS — I5033 Acute on chronic diastolic (congestive) heart failure: Secondary | ICD-10-CM | POA: Diagnosis not present

## 2023-05-27 DIAGNOSIS — L03115 Cellulitis of right lower limb: Secondary | ICD-10-CM | POA: Diagnosis not present

## 2023-05-27 LAB — BASIC METABOLIC PANEL
Anion gap: 10 (ref 5–15)
BUN: 34 mg/dL — ABNORMAL HIGH (ref 8–23)
CO2: 25 mmol/L (ref 22–32)
Calcium: 8.4 mg/dL — ABNORMAL LOW (ref 8.9–10.3)
Chloride: 93 mmol/L — ABNORMAL LOW (ref 98–111)
Creatinine, Ser: 1.24 mg/dL (ref 0.61–1.24)
GFR, Estimated: 55 mL/min — ABNORMAL LOW (ref >60)
Glucose, Bld: 104 mg/dL — ABNORMAL HIGH (ref 70–99)
Potassium: 4 mmol/L (ref 3.5–5.1)
Sodium: 128 mmol/L — ABNORMAL LOW (ref 135–145)

## 2023-05-27 LAB — CBC
HCT: 39.9 % (ref 39.0–52.0)
Hemoglobin: 13.6 g/dL (ref 13.0–17.0)
MCH: 30.9 pg (ref 26.0–34.0)
MCHC: 34.1 g/dL (ref 30.0–36.0)
MCV: 90.7 fL (ref 80.0–100.0)
Platelets: 251 10*3/uL (ref 150–400)
RBC: 4.4 MIL/uL (ref 4.22–5.81)
RDW: 13.8 % (ref 11.5–15.5)
WBC: 8.8 10*3/uL (ref 4.0–10.5)
nRBC: 0 % (ref 0.0–0.2)

## 2023-05-27 LAB — CULTURE, BLOOD (ROUTINE X 2): Special Requests: ADEQUATE

## 2023-05-27 LAB — MAGNESIUM: Magnesium: 2.1 mg/dL (ref 1.7–2.4)

## 2023-05-27 MED ORDER — RENA-VITE PO TABS
1.0000 | ORAL_TABLET | Freq: Every day | ORAL | Status: DC
  Administered 2023-05-27 – 2023-05-28 (×2): 1 via ORAL
  Filled 2023-05-27 (×2): qty 1

## 2023-05-27 MED ORDER — ENSURE ENLIVE PO LIQD
237.0000 mL | Freq: Two times a day (BID) | ORAL | Status: DC
  Administered 2023-05-27 – 2023-05-29 (×4): 237 mL via ORAL

## 2023-05-27 MED ORDER — CEPHALEXIN 250 MG PO CAPS
250.0000 mg | ORAL_CAPSULE | Freq: Three times a day (TID) | ORAL | Status: DC
  Administered 2023-05-27 – 2023-05-29 (×8): 250 mg via ORAL
  Filled 2023-05-27 (×9): qty 1

## 2023-05-27 NOTE — Progress Notes (Signed)
Progress Note   Patient: Mark Stark. ZOX:[id number] DOB: [birth year] DOA: 05/25/2023     1 DOS: the patient was seen and examined on 05/27/2023   Brief hospital course: Mr. Goode was admitted to the hospital with the working diagnosis of hyponatremia and right lower extremity cellulitis. Heart failure exacerbation.   89 yo male with the past medical history of hypertension, dyslipidemia, atrial fibrillation, heart failure and CKD who presented with  lower extremity edema. Reported 2 weeks of worsening lower extremity edema bilaterally, associated with erythema. As outpatient he was treated with doxycycline and cephalexin by his primary care provider. Blood work showed hyponatremia and he was referred to the ED for further evaluation.   Na 122, K 4,5 Cl 88, bicarbonate 25, glucose 106 bun 32, cr 1.21 BNP 455  Wbc 11.6 hgb 14.4 plt 278   Urine analysis SG 1,015, negative protein, negative leukocytes and negative Hgb. pH 7,0  Urine osmolality 431, urine Na 14.   Chest radiograph with right hemidiaphragm elevation with no effusions or infiltrates.   EKG 112 bpm, normal axis, normal intervals, atrial fibrillation rhythm with no significant ST segment or T wave changes.   Patient was placed on furosemide for diuresis.   06/02 responding well to diuresis. Change antibiotic therapy to oral cephalexin.   Assessment and Plan: * Acute on chronic diastolic CHF (congestive heart failure) (HCC) Echocardiogram with preserved LV systolic function EF 70 to 75%, no LVH, RV with preserved systolic function, RVSP 43.4 mmHg, LA with severe dilatation, RA with moderate dilatation, severe mitral regurgitation, moderate aortic regurgitation.   Urine output is 2,050 ml  Systolic blood pressure 97 to 811 mmHg.  Continue with furosemide to 60 mg IV q12 hrs and  spironolactone. No SGLT 2 inh due to cellulitis. No ARB due to risk of hypotension.  Continue metoprolol for rate control atrial fibrillation.    Cellulitis of right lower extremity Right lower extremity cellulitis. Right leg with more edema than left.  Clinically improving, will transition from IV ceftriaxone to oral cephalexin.   Persistent atrial fibrillation (HCC) Continue rate control with metoprolol and anticoagulation with apixaban.  Continue telemetry monitoring.   Chronic kidney disease, stage 3a (HCC) Hyponatremia.   Improving volume status, renal function today with serum cr at 1,24 with K at 4,0 and serum bicarbonate at 25. Na 128, Mg 2,1   Continue diuresis with spironolactone and furosemide.   Essential hypertension Continue blood pressure monitoring. Aggressive diuresis and metoprolol.   Hyperlipidemia Continue with statin therapy.        Subjective: Patient with improvement in edema, no chest pain, no orthopnea or PND. He has not been out of bed yet.   Physical Exam: Vitals:   05/26/23 1244 05/26/23 1647 05/26/23 1954 05/27/23 0426  BP: 100/75 90/70 110/64 97/61  Pulse:  (!) 109 94 96  Resp: 20 18 15 19   Temp: 97.6 F (36.4 C) (!) 97.5 F (36.4 C) 97.9 F (36.6 C) 97.7 F (36.5 C)  TempSrc: Oral Oral Oral Oral  SpO2:  99% 99% 98%  Weight:    62.6 kg  Height:       Neurology awake and alert ENT with mild pallor Cardiovascular with S1 and S2 present, irregularly irregular with no gallops or rubs, positive systolic murmur at the apex No JVD Lower extremity edema ++ pitting more right than left  Respiratory with no rales or wheezing Abdomen with no distention  Data Reviewed:    Family Communication: no family  at the bedside   Disposition: Status is: Inpatient Remains inpatient appropriate because: heart failure IV diuresis.   Planned Discharge Destination: Home    Author: Coralie Keens, MD 05/27/2023 8:54 AM  For on call review [email or web address].

## 2023-05-27 NOTE — Progress Notes (Signed)
Initial Nutrition Assessment  DOCUMENTATION CODES:   Not applicable  INTERVENTION:  - Add Ensure Enlive po BID, each supplement provides 350 kcal and 20 grams of protein.  - Add Rena-vit.   NUTRITION DIAGNOSIS:   Unintentional weight loss related to chronic illness as evidenced by edema.  GOAL:   Patient will meet greater than or equal to 90% of their needs  MONITOR:   Supplement acceptance, PO intake  REASON FOR ASSESSMENT:   Consult Assessment of nutrition requirement/status  ASSESSMENT:   89 y.o. male admits related to low sodium, bilateral leg and hand swelling and right lower leg redness. PMH includes: HTN, HLD, CKD stage3, CHF. Pt is currently receiving medical management for chronic diastolic CHF.  PMH includes: lasix, aldactone. Labs reviewed: Na low, BUN elevated.   RD attempted to call pt's room but no answer. Per record, pt ate 100% of his breakfast and lunch yesterday. Per record, there was a slight drop in weight, however this could be fluid related in setting of CHF. RD will add Ensure supplements and continue to monitor PO intakes.   NUTRITION - FOCUSED PHYSICAL EXAM:  Remote assessment.   Diet Order:   Diet Order             Diet regular Fluid consistency: Thin; Fluid restriction: 1200 mL Fluid  Diet effective now                   EDUCATION NEEDS:   Not appropriate for education at this time  Skin:  Skin Assessment: Reviewed RN Assessment  Last BM:  6/1  Height:   Ht Readings from Last 1 Encounters:  05/25/23 5\' 6"  (1.676 m)    Weight:   Wt Readings from Last 1 Encounters:  05/27/23 62.6 kg    Ideal Body Weight:     BMI:  Body mass index is 22.28 kg/m.  Estimated Nutritional Needs:   Kcal:  4098-1191 kcals  Protein:  80-95 gm  Fluid:  </= 1.2 L  Bethann Humble, RD, LDN, CNSC.

## 2023-05-27 NOTE — Evaluation (Signed)
Physical Therapy Evaluation Patient Details Name: Mark Stark. MRN: [id number] DOB: 01-14-1928 Today's Date: 05/27/2023  History of Present Illness  89 yo male presenting with low sodium swelling and R LE redness. Workup underway for R LE cellulitis, acute on chronic CHF and hyponatremia PMH HLD HTN Afib on eliquis CKDIII CHF diverticulitis aortic insufficiency prostate CA  Clinical Impression   Pt currently with functional limitiations due to the deficits listed below (see PT problem list). Pt indep and driving prior to admission living alone. Pt currently with balance deficits and recommendation for RW. Pt could benefit from increased (A) upon d/c from friend. Pt will benefit from skilled PT to increase their independence and safety with adls and balance to allow discharge HHPT pending progress.        Recommendations for follow up therapy are one component of a multi-disciplinary discharge planning process, led by the attending physician.  Recommendations may be updated based on patient status, additional functional criteria and insurance authorization.  Follow Up Recommendations       Assistance Recommended at Discharge PRN  Patient can return home with the following  Assistance with cooking/housework;Assist for transportation    Equipment Recommendations Rolling walker (2 wheels);BSC/3in1 (will consider RW vs rollator)  Recommendations for Other Services       Functional Status Assessment Patient has had a recent decline in their functional status and demonstrates the ability to make significant improvements in function in a reasonable and predictable amount of time.     Precautions / Restrictions Precautions Precautions: Fall Restrictions Weight Bearing Restrictions: No      Mobility  Bed Mobility Overal bed mobility: Modified Independent             General bed mobility comments: hob 40 degrees    Transfers Overall transfer level: Needs assistance Equipment  used: Rolling walker (2 wheels) Transfers: Sit to/from Stand Sit to Stand: Min assist           General transfer comment: heavy use of hands to power up from surface    Ambulation/Gait Ambulation/Gait assistance: Min assist Gait Distance (Feet): 10 Feet Assistive device: Rolling walker (2 wheels) Gait Pattern/deviations: Step-through pattern, Trunk flexed Gait velocity: slow     General Gait Details: Cues for keep RW close  Stairs            Wheelchair Mobility    Modified Rankin (Stroke Patients Only)       Balance Overall balance assessment: Mild deficits observed, not formally tested                                           Pertinent Vitals/Pain Pain Assessment Pain Assessment: 0-10 Pain Score: 2  Pain Location: L foot cramp Pain Descriptors / Indicators: Discomfort Pain Intervention(s): Monitored during session    Home Living Family/patient expects to be discharged to:: Private residence Living Arrangements: Alone Available Help at Discharge: Family;Friend(s);Available PRN/intermittently Type of Home: House Home Access: Stairs to enter Entrance Stairs-Rails: Can reach both Entrance Stairs-Number of Steps: 3   Home Layout: One level Home Equipment: Agricultural consultant (2 wheels);Shower seat;BSC/3in1;Cane - single point Additional Comments: reports some of DME is attic or garage but not using .    Prior Function Prior Level of Function : Independent/Modified Independent;Driving             Mobility Comments: uses the cane periodically if  uneven ground but not in the home ADLs Comments: drives only in very nearby to the house ( 10 mile radius of home), grocery shopping     Hand Dominance   Dominant Hand: Right    Extremity/Trunk Assessment   Upper Extremity Assessment Upper Extremity Assessment: Defer to OT evaluation    Lower Extremity Assessment Lower Extremity Assessment: Generalized weakness;RLE deficits/detail RLE  Deficits / Details: Noting erythema lower leg; blister noted on lower R LE prior to ankle    Cervical / Trunk Assessment Cervical / Trunk Assessment: Kyphotic  Communication   Communication: HOH  Cognition Arousal/Alertness: Awake/alert Behavior During Therapy: WFL for tasks assessed/performed Overall Cognitive Status: Impaired/Different from baseline                                 General Comments: pt reports feeling little confused since admission and its a little scary because its the first time i have ever felt that. pt overall oriented to place time adn situation. pt attempting to orient himself more by asking what is my room number here and informed 5n20.        General Comments      Exercises     Assessment/Plan    PT Assessment Patient needs continued PT services  PT Problem List Decreased strength;Decreased activity tolerance;Decreased balance;Decreased mobility;Decreased coordination;Decreased cognition;Decreased knowledge of use of DME;Decreased safety awareness;Decreased knowledge of precautions;Cardiopulmonary status limiting activity;Pain       PT Treatment Interventions DME instruction;Gait training;Stair training;Functional mobility training;Therapeutic activities;Therapeutic exercise;Balance training;Neuromuscular re-education;Cognitive remediation;Patient/family education    PT Goals (Current goals can be found in the Care Plan section)  Acute Rehab PT Goals Patient Stated Goal: Did not specifically state PT Goal Formulation: With patient Time For Goal Achievement: 06/10/23 Potential to Achieve Goals: Good    Frequency Min 3X/week     Co-evaluation               AM-PAC PT "6 Clicks" Mobility  Outcome Measure Help needed turning from your back to your side while in a flat bed without using bedrails?: None Help needed moving from lying on your back to sitting on the side of a flat bed without using bedrails?: A Little Help needed  moving to and from a bed to a chair (including a wheelchair)?: A Little Help needed standing up from a chair using your arms (e.g., wheelchair or bedside chair)?: A Little Help needed to walk in hospital room?: A Little Help needed climbing 3-5 steps with a railing? : A Lot 6 Click Score: 18    End of Session Equipment Utilized During Treatment: Gait belt Activity Tolerance: Patient tolerated treatment well Patient left: in bed;with call bell/phone within reach;with bed alarm set (Sitting EOB to take meds) Nurse Communication: Mobility status PT Visit Diagnosis: Unsteadiness on feet (R26.81);Muscle weakness (generalized) (M62.81)    Time: 1610-9604 PT Time Calculation (min) (ACUTE ONLY): 10 min   Charges:   PT Evaluation $PT Eval Low Complexity: 1 Low          Van Clines, PT  Acute Rehabilitation Services Office [phone] Secure Chat welcomed   Levi Aland 05/27/2023, 5:40 PM

## 2023-05-27 NOTE — Evaluation (Signed)
Occupational Therapy Evaluation Patient Details Name: Mark Stark. MRN: [id number] DOB: 09/10/1928 Today's Date: 05/27/2023   History of Present Illness 89 yo male presenting with low sodium swelling and R LE redness. Workup underway for R LE cellulitis, acute on chronic CHF and hyponatremia PMH HLD HTN Afib on eliquis CKDIII CHF diverticulitis aortic insufficiency prostate CA   Clinical Impression   PT admitted with R LE cellulitis. Pt currently with functional limitiations due to the deficits listed below (see OT problem list). Pt indep and driving prior to admission living alone. Pt currently with balance deficits and recommendation for RW. Pt could benefit from increased (A) upon d/c from friend. Pt will benefit from skilled OT to increase their independence and safety with adls and balance to allow discharge HHOT pending progress.       Recommendations for follow up therapy are one component of a multi-disciplinary discharge planning process, led by the attending physician.  Recommendations may be updated based on patient status, additional functional criteria and insurance authorization.   Assistance Recommended at Discharge Intermittent Supervision/Assistance  Patient can return home with the following A little help with walking and/or transfers;A little help with bathing/dressing/bathroom    Functional Status Assessment  Patient has had a recent decline in their functional status and demonstrates the ability to make significant improvements in function in a reasonable and predictable amount of time.  Equipment Recommendations  Other (comment) (RW)    Recommendations for Other Services       Precautions / Restrictions Precautions Precautions: Fall      Mobility Bed Mobility Overal bed mobility: Modified Independent             General bed mobility comments: hob 40 degrees    Transfers Overall transfer level: Needs assistance Equipment used: Rolling walker (2  wheels) Transfers: Sit to/from Stand Sit to Stand: Min assist           General transfer comment: heavy use of hands to power up from surface      Balance Overall balance assessment: Mild deficits observed, not formally tested                                         ADL either performed or assessed with clinical judgement   ADL Overall ADL's : Needs assistance/impaired Eating/Feeding: Set up;Sitting   Grooming: Wash/dry face;Wash/dry hands;Oral care;Minimal assistance;Standing   Upper Body Bathing: Minimal assistance;Sitting   Lower Body Bathing: Moderate assistance;Sit to/from stand           Toilet Transfer: Minimal assistance;Rolling walker (2 wheels)           Functional mobility during ADLs: Minimal assistance;Rolling walker (2 wheels) General ADL Comments: pt with LOB with head turns during session with RW     Vision Baseline Vision/History: 1 Wears glasses Ability to See in Adequate Light: 1 Impaired       Perception     Praxis      Pertinent Vitals/Pain Pain Assessment Pain Assessment: 0-10 Pain Score: 2  Pain Location: L foot cramp Pain Descriptors / Indicators: Discomfort Pain Intervention(s): Monitored during session, Limited activity within patient's tolerance     Hand Dominance Right   Extremity/Trunk Assessment Upper Extremity Assessment Upper Extremity Assessment: Overall WFL for tasks assessed       Cervical / Trunk Assessment Cervical / Trunk Assessment: Kyphotic   Communication Communication Communication: [facility]  Cognition Arousal/Alertness: Awake/alert Behavior During Therapy: WFL for tasks assessed/performed Overall Cognitive Status: Impaired/Different from baseline                                 General Comments: pt reports feeling little confused since admission and its a little scary because its the first time i have ever felt that. pt overall oriented to place time adn situation. pt  attempting to orient himself more by asking what is my room number here and informed 5n20.     General Comments  blister noted on lower R LE prior to ankle    Exercises     Shoulder Instructions      Home Living Family/patient expects to be discharged to:: Private residence Living Arrangements: Alone Available Help at Discharge: Family;Friend(s);Available PRN/intermittently Type of Home: House Home Access: Stairs to enter [employer] of Steps: 3 Entrance Stairs-Rails: Can reach both Home Layout: One level     Bathroom Shower/Tub: Chief Strategy Officer: Standard     Home Equipment: Agricultural consultant (2 wheels);Shower seat;BSC/3in1;Cane - single point   Additional Comments: reports some of DME is attic or garage but not using .      Prior Functioning/Environment Prior Level of Function : Independent/Modified Independent;Driving             Mobility Comments: uses the cane periodically if uneven ground but not in the home ADLs Comments: drives only in very nearby to the house ( 10 mile radius of home), grocery shopping        OT Problem List: Decreased strength;Decreased activity tolerance;Impaired balance (sitting and/or standing);Decreased cognition;Decreased safety awareness;Decreased knowledge of use of DME or AE;Decreased knowledge of precautions      OT Treatment/Interventions: Self-care/ADL training;Therapeutic exercise;Energy conservation;DME and/or AE instruction;Manual therapy;Modalities;Therapeutic activities;Cognitive remediation/compensation;Patient/family education;Balance training    OT Goals(Current goals can be found in the care plan section) Acute Rehab OT Goals Patient Stated Goal: to get stronger before home OT Goal Formulation: With patient Time For Goal Achievement: 06/10/23 Potential to Achieve Goals: Good  OT Frequency: Min 2X/week    Co-evaluation              AM-PAC OT "6 Clicks" Daily Activity     Outcome  Measure Help from another person eating meals?: A Little Help from another person taking care of personal grooming?: A Little Help from another person toileting, which includes using toliet, bedpan, or urinal?: A Little Help from another person bathing (including washing, rinsing, drying)?: A Little Help from another person to put on and taking off regular upper body clothing?: A Little Help from another person to put on and taking off regular lower body clothing?: A Little 6 Click Score: 18   End of Session Equipment Utilized During Treatment: Rolling walker (2 wheels) Nurse Communication: Mobility status;Precautions  Activity Tolerance: Patient tolerated treatment well Patient left: in chair;with call bell/phone within reach;with chair alarm set  OT Visit Diagnosis: Unsteadiness on feet (R26.81);Muscle weakness (generalized) (M62.81)                Time: 9562-1308 OT Time Calculation (min): 20 min Charges:  OT General Charges $OT Visit: 1 Visit OT Evaluation $OT Eval Moderate Complexity: 1 Mod   Brynn, OTR/L  Acute Rehabilitation Services Office: [phone] .   Mateo Flow 05/27/2023, 12:04 PM

## 2023-05-27 NOTE — Telephone Encounter (Signed)
Addressed during hospitalization. Will remove from Dr. Di Kindle inbasket.   Alver Sorrow, NP

## 2023-05-28 ENCOUNTER — Other Ambulatory Visit (HOSPITAL_COMMUNITY): Payer: Self-pay

## 2023-05-28 DIAGNOSIS — L03115 Cellulitis of right lower limb: Secondary | ICD-10-CM | POA: Diagnosis not present

## 2023-05-28 DIAGNOSIS — N1831 Chronic kidney disease, stage 3a: Secondary | ICD-10-CM | POA: Diagnosis not present

## 2023-05-28 DIAGNOSIS — I5033 Acute on chronic diastolic (congestive) heart failure: Secondary | ICD-10-CM | POA: Diagnosis not present

## 2023-05-28 DIAGNOSIS — I4819 Other persistent atrial fibrillation: Secondary | ICD-10-CM | POA: Diagnosis not present

## 2023-05-28 LAB — BASIC METABOLIC PANEL
Anion gap: 10 (ref 5–15)
BUN: 41 mg/dL — ABNORMAL HIGH (ref 8–23)
CO2: 28 mmol/L (ref 22–32)
Calcium: 8.7 mg/dL — ABNORMAL LOW (ref 8.9–10.3)
Chloride: 94 mmol/L — ABNORMAL LOW (ref 98–111)
Creatinine, Ser: 1.26 mg/dL — ABNORMAL HIGH (ref 0.61–1.24)
GFR, Estimated: 54 mL/min — ABNORMAL LOW (ref >60)
Glucose, Bld: 111 mg/dL — ABNORMAL HIGH (ref 70–99)
Potassium: 4 mmol/L (ref 3.5–5.1)
Sodium: 132 mmol/L — ABNORMAL LOW (ref 135–145)

## 2023-05-28 LAB — CBC
HCT: 41.8 % (ref 39.0–52.0)
Hemoglobin: 14.2 g/dL (ref 13.0–17.0)
MCH: 30.7 pg (ref 26.0–34.0)
MCHC: 34 g/dL (ref 30.0–36.0)
MCV: 90.5 fL (ref 80.0–100.0)
Platelets: 262 10*3/uL (ref 150–400)
RBC: 4.62 MIL/uL (ref 4.22–5.81)
RDW: 13.8 % (ref 11.5–15.5)
WBC: 11.6 10*3/uL — ABNORMAL HIGH (ref 4.0–10.5)
nRBC: 0 % (ref 0.0–0.2)

## 2023-05-28 LAB — ALPHA-GAL PANEL
Allergen, Mutton, f88: 0.47 kU/L — ABNORMAL HIGH
Allergen, Pork, f26: 0.4 kU/L — ABNORMAL HIGH
Beef: 1.35 kU/L — ABNORMAL HIGH
CLASS: 1
CLASS: 2
Class: 1
GALACTOSE-ALPHA-1,3-GALACTOSE IGE*: 9.28 kU/L — ABNORMAL HIGH (ref <0.10)

## 2023-05-28 LAB — CULTURE, BLOOD (ROUTINE X 2)
Culture: NO GROWTH
Special Requests: ADEQUATE

## 2023-05-28 LAB — ROCKY MTN SPOTTED FVR ABS PNL(IGG+IGM)
RMSF IgG: NOT DETECTED
RMSF IgM: NOT DETECTED

## 2023-05-28 LAB — INTERPRETATION:

## 2023-05-28 MED ORDER — EMPAGLIFLOZIN 10 MG PO TABS
10.0000 mg | ORAL_TABLET | Freq: Every day | ORAL | Status: DC
  Administered 2023-05-28 – 2023-05-29 (×2): 10 mg via ORAL
  Filled 2023-05-28 (×2): qty 1

## 2023-05-28 NOTE — Progress Notes (Signed)
Heart Failure Navigator Progress Note  Assessed for Heart & Vascular TOC clinic readiness.  Patient does not meet criteria due to EF 70-75% , had [facility] appointment on 06/24/2021. .   Navigator will sign off at this time.   Rhae Hammock, BSN, Scientist, clinical (histocompatibility and immunogenetics) Only

## 2023-05-28 NOTE — Progress Notes (Signed)
Occupational Therapy Treatment Patient Details Name: Mark Stark. MRN: [id number] DOB: Nov 29, 1928 Today's Date: 05/28/2023   History of present illness 89 yo male presenting with low sodium swelling and R LE redness. Workup underway for R LE cellulitis, acute on chronic CHF and hyponatremia PMH HLD HTN Afib on eliquis CKDIII CHF diverticulitis aortic insufficiency prostate CA   OT comments  Pt progressing towards OT goals this session. Not back to cognitive baseline. Would benefit from a pill box test assessment. Eager to ambulate with HR up to 134 at one point, able to maintain standing at sink for grooming tasks with assist for opening up toothpaste container and locating items around the sink. Pt reports that even with hearing aids he doesn't hear well and can't see well either. OT POC remains appropriate and OT will continue to follow acutely.    Recommendations for follow up therapy are one component of a multi-disciplinary discharge planning process, led by the attending physician.  Recommendations may be updated based on patient status, additional functional criteria and insurance authorization.    Assistance Recommended at Discharge Intermittent Supervision/Assistance  Patient can return home with the following  A little help with walking and/or transfers;A little help with bathing/dressing/bathroom   Equipment Recommendations  Other (comment) (RW)    Recommendations for Other Services      Precautions / Restrictions Precautions Precautions: Fall Restrictions Weight Bearing Restrictions: No       Mobility Bed Mobility               General bed mobility comments: walking with MS at beginning of session and on toilet at EOS    Transfers Overall transfer level: Needs assistance Equipment used: Rolling walker (2 wheels) Transfers: Sit to/from Stand Sit to Stand: Min assist           General transfer comment: steadying assist from toilet     Balance Overall  balance assessment: Mild deficits observed, not formally tested                                         ADL either performed or assessed with clinical judgement   ADL Overall ADL's : Needs assistance/impaired Eating/Feeding: Set up;Sitting   Grooming: Wash/dry face;Wash/dry hands;Oral care;Minimal assistance;Standing Grooming Details (indicate cue type and reason): to open containers/boxes small items                 Toilet Transfer: Minimal assistance;Rolling walker (2 wheels) Toilet Transfer Details (indicate cue type and reason): cues for safety with RW, assist to manage clothing prior to sitting         Functional mobility during ADLs: Minimal assistance;Rolling walker (2 wheels) General ADL Comments: no LOB this session    Extremity/Trunk Assessment Upper Extremity Assessment Upper Extremity Assessment: Generalized weakness (sometimes struggles to open containers at baseline)   Lower Extremity Assessment Lower Extremity Assessment: Defer to PT evaluation        Vision   Additional Comments: baseline vision "I don't very well anymore"   Perception     Praxis      Cognition Arousal/Alertness: Awake/alert Behavior During Therapy: WFL for tasks assessed/performed Overall Cognitive Status: Impaired/Different from baseline Area of Impairment: Memory, Awareness, Problem solving                     Memory: Decreased short-term memory     Awareness: Emergent  Problem Solving: Slow processing, Requires verbal cues General Comments: Pt overall reporting that "I don't feel like myself" 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        Exercises      Shoulder Instructions       General Comments      Pertinent Vitals/ Pain       Pain Assessment Pain  Assessment: Faces Faces Pain Scale: No hurt Pain Intervention(s): Monitored during session, Repositioned  Home Living                                          Prior Functioning/Environment              Frequency  Min 2X/week        Progress Toward Goals  OT Goals(current goals can now be found in the care plan section)  Progress towards OT goals: Progressing toward goals  Acute Rehab OT Goals Patient Stated Goal: be independent and feel like myself again OT Goal Formulation: With patient Time For Goal Achievement: 06/10/23 Potential to Achieve Goals: Good  Plan Discharge plan remains appropriate    Co-evaluation                 AM-PAC OT "6 Clicks" Daily Activity     Outcome Measure   Help from another person eating meals?: A Little Help from another person taking care of personal grooming?: A Little Help from another person toileting, which includes using toliet, bedpan, or urinal?: A Little Help from another person bathing (including washing, rinsing, drying)?: A Little Help from another person to put on and taking off regular upper body clothing?: A Little Help from another person to put on and taking off regular lower body clothing?: A Little 6 Click Score: 18    End of Session Equipment Utilized During Treatment: Rolling walker (2 wheels)  OT Visit Diagnosis: Unsteadiness on feet (R26.81);Muscle weakness (generalized) (M62.81)   Activity Tolerance Patient tolerated treatment well   Patient Left Other (comment) (on toilet, Pt aware to pull chord to call for assist)   Nurse Communication Mobility status;Precautions;Other (comment) (on toilet)        Time: 9147-8295 OT Time Calculation (min): 20 min  Charges: OT General Charges $OT Visit: 1 Visit OT Treatments $Self Care/Home Management : 8-22 mins  Mark Stark OTR/L Acute Rehabilitation Services Office: [phone]  [facility] 05/28/2023, 11:34 AM

## 2023-05-28 NOTE — TOC Benefit Eligibility Note (Signed)
Patient Product/process development scientist completed.    The patient is currently admitted and upon discharge could be taking Jardiance 10 mg.  The current 30 day co-pay is $35.00.   The patient is insured through [employer]   This test claim was processed through [facility]- copay amounts may vary at other pharmacies due to [employer], or as the patient moves through the different stages of their insurance plan.  Roland Earl, CPHT Pharmacy Patient Advocate Specialist [facility] Patient Advocate Team Direct Number: [phone]  Fax: [phone]

## 2023-05-28 NOTE — Progress Notes (Signed)
Progress Note   Patient: Mark Stark. ZOX:[id number] DOB: [birth year] DOA: 05/25/2023     2 DOS: the patient was seen and examined on 05/28/2023   Brief hospital course: Mark Stark was admitted to the hospital with the working diagnosis of hyponatremia and right lower extremity cellulitis. Heart failure exacerbation.   89 yo male with the past medical history of hypertension, dyslipidemia, atrial fibrillation, heart failure and CKD who presented with  lower extremity edema. Reported 2 weeks of worsening lower extremity edema bilaterally, associated with erythema. As outpatient he was treated with doxycycline and cephalexin by his primary care provider. Blood work showed hyponatremia and he was referred to the ED for further evaluation.   Na 122, K 4,5 Cl 88, bicarbonate 25, glucose 106 bun 32, cr 1.21 BNP 455  Wbc 11.6 hgb 14.4 plt 278   Urine analysis SG 1,015, negative protein, negative leukocytes and negative Hgb. pH 7,0  Urine osmolality 431, urine Na 14.   Chest radiograph with right hemidiaphragm elevation with no effusions or infiltrates.   EKG 112 bpm, normal axis, normal intervals, atrial fibrillation rhythm with no significant ST segment or T wave changes.   Patient was placed on furosemide for diuresis.   06/02 responding well to diuresis. Change antibiotic therapy to oral cephalexin.  06/03 patient is euvolemic today, plan for possible transfer home tomorrow.   Assessment and Plan: * Acute on chronic diastolic CHF (congestive heart failure) (HCC) Echocardiogram with preserved LV systolic function EF 70 to 75%, no LVH, RV with preserved systolic function, RVSP 43.4 mmHg, LA with severe dilatation, RA with moderate dilatation, severe mitral regurgitation, moderate aortic regurgitation.   Urine output is 3,750  ml  Systolic blood pressure 93 to 811 mmHg.  Continue with spironolactone and will add SGLT inh Transition to po furosemide in am.  Continue metoprolol for rate  control atrial fibrillation. Out of bed to the chair and continue to encourage mobility.    Cellulitis of right lower extremity Right lower extremity cellulitis. Right leg with more edema than left.  Clinically improving, continue with cephalexin.   Persistent atrial fibrillation (HCC) Continue rate control with metoprolol and anticoagulation with apixaban.  Continue telemetry monitoring.   Chronic kidney disease, stage 3a (HCC) Hyponatremia.   Renal function with serum cr at 1,26 with K at 4,0 and serum bicarbonate at 28. Na 132 and Mg 2.1   Patient will continue diuresis with SGLT 2 inh and spironolactone.  Transition to oral furosemide tomorrow.  Follow up renal function in am.     Essential hypertension Change furosemide to po tomorrow.  Hold pm dose of IV furosemide.  Continue with metoprolol.   Hyperlipidemia Continue with statin therapy.        Subjective: Patient with no chest pain or dyspnea, edema has improved, on the right leg and resolved on the left. This am he is seating in the chair at the side of the bed.   Physical Exam: Vitals:   05/28/23 0437 05/28/23 0747 05/28/23 0748 05/28/23 1132  BP: 109/60 100/69 100/69   Pulse: 94 68 69 94  Resp: 20     Temp: 98.1 F (36.7 C)  98 F (36.7 C)   TempSrc: Oral  Oral   SpO2: 98% 99% 98%   Weight: 61.1 kg     Height:       Neurology awake and alert ENT with mild pallor Cardiovascular with S1 and S2 present, irregularly irregular with no gallops, positive systolic murmur  at the apex No JVD Left lower extremity edema resolved, right lower extremity with + pitting Respiratory with no rales or wheezing, no rhonchi Abdomen with no distention  Right let erythema is improving, no purulence or drainage.  Data Reviewed:    Family Communication: no family at the bedside   Disposition: Status is: Inpatient Remains inpatient appropriate because: possible discharge tomorrow   Planned Discharge Destination:  Home    Author: Coralie Keens, MD 05/28/2023 1:29 PM  For on call review [email or web address].

## 2023-05-28 NOTE — Progress Notes (Signed)
Mobility Specialist Progress Note   05/28/23 1048  Mobility  Activity Ambulated with assistance in hallway  Level of Assistance Minimal assist, patient does 75% or more  Assistive Device Front wheel walker  Distance Ambulated (ft) 48 ft  Activity Response Tolerated well  Mobility Referral Yes  $Mobility charge 1 Mobility  Mobility Specialist Start Time (ACUTE ONLY) 1028  Mobility Specialist Stop Time (ACUTE ONLY) 1041  Mobility Specialist Time Calculation (min) (ACUTE ONLY) 13 min   Pre Mobility: 89 HR During Mobility: 127 HR  Received pt in bed having no c/o pain but expressing feeling "lazy". Agreeable to mobility. Stand by assist to EOB and minA to stand d/t pt trying to pull on RW despite cues on pushing up from base. Ambulated in hallway w/ a quick and narrow gait, required min cues on speed, foot placement and proximity to RW. Pt having no complaints during hallway ambulation. Pt passed off to PT for continued ambulation.     Frederico Hamman Mobility Specialist Please contact via SecureChat or  Rehab office at [phone]

## 2023-05-29 ENCOUNTER — Other Ambulatory Visit (HOSPITAL_COMMUNITY): Payer: Self-pay

## 2023-05-29 DIAGNOSIS — L03115 Cellulitis of right lower limb: Secondary | ICD-10-CM | POA: Diagnosis not present

## 2023-05-29 DIAGNOSIS — I5033 Acute on chronic diastolic (congestive) heart failure: Secondary | ICD-10-CM | POA: Diagnosis not present

## 2023-05-29 DIAGNOSIS — I4819 Other persistent atrial fibrillation: Secondary | ICD-10-CM | POA: Diagnosis not present

## 2023-05-29 DIAGNOSIS — N1831 Chronic kidney disease, stage 3a: Secondary | ICD-10-CM | POA: Diagnosis not present

## 2023-05-29 LAB — BASIC METABOLIC PANEL
Anion gap: 9 (ref 5–15)
BUN: 45 mg/dL — ABNORMAL HIGH (ref 8–23)
CO2: 26 mmol/L (ref 22–32)
Calcium: 8.7 mg/dL — ABNORMAL LOW (ref 8.9–10.3)
Chloride: 97 mmol/L — ABNORMAL LOW (ref 98–111)
Creatinine, Ser: 1.23 mg/dL (ref 0.61–1.24)
GFR, Estimated: 55 mL/min — ABNORMAL LOW (ref >60)
Glucose, Bld: 108 mg/dL — ABNORMAL HIGH (ref 70–99)
Potassium: 4.3 mmol/L (ref 3.5–5.1)
Sodium: 132 mmol/L — ABNORMAL LOW (ref 135–145)

## 2023-05-29 MED ORDER — TORSEMIDE 20 MG PO TABS
20.0000 mg | ORAL_TABLET | Freq: Every day | ORAL | Status: DC
Start: 2023-05-29 — End: 2023-07-09

## 2023-05-29 MED ORDER — ENSURE ENLIVE PO LIQD
237.0000 mL | Freq: Two times a day (BID) | ORAL | 0 refills | Status: AC
  Filled 2023-05-29: qty 14220, 30d supply, fill #0

## 2023-05-29 MED ORDER — TORSEMIDE 20 MG PO TABS
20.0000 mg | ORAL_TABLET | ORAL | Status: DC
Start: 2023-05-29 — End: 2023-05-29

## 2023-05-29 MED ORDER — TRAMADOL HCL 50 MG PO TABS
50.0000 mg | ORAL_TABLET | Freq: Once | ORAL | Status: AC
  Administered 2023-05-29: 50 mg via ORAL
  Filled 2023-05-29: qty 1

## 2023-05-29 MED ORDER — EMPAGLIFLOZIN 10 MG PO TABS
10.0000 mg | ORAL_TABLET | Freq: Every day | ORAL | 0 refills | Status: DC
  Filled 2023-05-29: qty 30, 30d supply, fill #0

## 2023-05-29 MED ORDER — ACETAMINOPHEN 325 MG PO TABS: 650.0000 mg | ORAL_TABLET | Freq: Four times a day (QID) | ORAL | Status: DC | PRN

## 2023-05-29 MED ORDER — SPIRONOLACTONE 25 MG PO TABS
12.5000 mg | ORAL_TABLET | Freq: Every day | ORAL | 0 refills | Status: DC
  Filled 2023-05-29: qty 15, 30d supply, fill #0

## 2023-05-29 MED ORDER — OXYCODONE HCL 5 MG PO TABS
2.5000 mg | ORAL_TABLET | Freq: Once | ORAL | Status: AC | PRN
  Administered 2023-05-29: 2.5 mg via ORAL
  Filled 2023-05-29: qty 1

## 2023-05-29 NOTE — Progress Notes (Addendum)
Discharge instructions reviewed with pt. Pt verbalized understanding of instructions.  Copy of instructions given to pt. [facility] filled new scripts for pt and meds were delivered to pt's room. Pt had a question about the jardiance, secure chat message sent to Dr Ella Jubilee to clarify question for pt.   Pt has a friend that will be coming to get him, pt states he is only about 10 minutes away, and plans to call him when he is dressed and has all paper work to go.   At 1431 questions/concerns clarified with MD, AVS updated, pt informed.   Pt d/c'd via wheelchair with belongings, with friend picking him up at the main entrance.   (Pt d/c'd with his black wallet, keys (2), tan pants, plaid shirt, tan bedroom shoes, another plaid shirt, 2 belts, underwear, electric shaver, hearing aid batteries all in a tan/pink bag).  Telephoned [name], she took pt's jeans to his home, pt informed.       Escorted by staff.    Annice Needy, RN SWOT

## 2023-05-29 NOTE — Plan of Care (Signed)

## 2023-05-29 NOTE — Discharge Summary (Addendum)
Physician Discharge Summary   Patient: Mark Stark. MRN: [id number] DOB: 09-30-1928  Admit date:     05/25/2023  Discharge date: 05/29/23  Discharge Physician: York Ram Selin Eisler   PCP: Donato Schultz, DO   Recommendations at discharge:    Patient will continue diuretic therapy with torsemide 20 mg daily, and added spironolactone and SGLT 2 inh. Holding on ARB due to risk of  hypotension. Follow up renal function and electrolytes in 7 days, holding K supplements for now since addition of spironolactone.  Completed antibiotic therapy for cellulitis. Follow up with Dr Zola Button in 7 to 10 days. Follow up with Cardiology as scheduled.    Discharge Diagnoses: Principal Problem:   Acute on chronic diastolic CHF (congestive heart failure) (HCC) Active Problems:   Cellulitis of right lower extremity   Persistent atrial fibrillation (HCC)   Chronic kidney disease, stage 3a (HCC)   Essential hypertension   Hyperlipidemia  Resolved Problems:   * No resolved hospital problems. [facility] Course: Mr. Bixler was admitted to the hospital with the working diagnosis of hyponatremia and right lower extremity cellulitis in the setting of heart failure exacerbation.   89 yo male with the past medical history of hypertension, dyslipidemia, atrial fibrillation, heart failure, mitral regurgitation and CKD who presented with  lower extremity edema. Reported 2 weeks of worsening lower extremity edema bilaterally, associated with erythema on the right leg. As outpatient he was treated with doxycycline and cephalexin by his primary care provider for cellulitis. Blood work showed hyponatremia and he was referred to the hospital for further evaluation.  On his initial physical examination his blood pressure was 112/72, HR 116, RR 13 and 02 saturation 98%, lungs with no wheezing or rales, heart with S1 and S2 present, irregularly irregular with systolic murmur at the apex, abdomen with no  distention and positive pitting bilateral lower extremity edema, more right than left.   Na 122, K 4,5 Cl 88, bicarbonate 25, glucose 106 bun 32, cr 1.21 BNP 455  Wbc 11.6 hgb 14.4 plt 278   Urine analysis SG 1,015, negative protein, negative leukocytes and negative Hgb. pH 7,0  Urine osmolality 431, urine Na 14.   Chest radiograph with right hemidiaphragm elevation with no effusions or infiltrates.   EKG 112 bpm, normal axis, normal intervals, atrial fibrillation rhythm with no significant ST segment or T wave changes.   Patient was placed on furosemide for diuresis and IV antibiotic therapy for cellulitis (present on admission).   06/02 responding well to diuresis. Change antibiotic therapy to oral cephalexin.  06/03 patient is euvolemic today, plan for possible transfer home tomorrow.  06/04 edema has resolved, erythema of his right lower extremity has disappeared. Plan to discharge home with home  health services and follow up as outpatient.    Assessment and Plan: * Acute on chronic diastolic CHF (congestive heart failure) (HCC) Echocardiogram with preserved LV systolic function EF 70 to 75%, no LVH, RV with preserved systolic function, RVSP 43.4 mmHg, LA with severe dilatation, RA with moderate dilatation, severe mitral regurgitation, moderate aortic regurgitation.   Patient was placed on IV furosemide for diuresis, negative fluid balance was achieved, - 6,269 ml, with significant improvement of his symptoms.   Patient will continue medical therapy with metoprolol, and added spironolactone and SGLT 2 inh.  Holding on ARB for now due to risk of hypotension.  Discharge home with home health service and close follow up as outpatient.   Cellulitis of  right lower extremity Right lower extremity cellulitis. Patient was treated with IV ceftriaxone and then transitioned to oral cephalexin. At the time of his discharge erythema and edema have resolved.  Will hold on further antibiotic  therapy and will recommend outpatient follow up.   Persistent atrial fibrillation (HCC) Continue rate control with metoprolol and anticoagulation with apixaban.   Chronic kidney disease, stage 3a (HCC) Hyponatremia.   Renal function has been stable at the time of his discharge serum cr is 1,23 with K at 4,3 and serum bicarbonate at 26. Na 132 and Mg 2,1   Plan to continue loop diuretic therapy with torsemide 20 mg daily, added spironolactone and SGLT 2 inh. Follow up renal function as outpatient.  Hold on K supplements for now.   Essential hypertension Systolic blood pressure 97 to 409 mmHg.  Plan to continue metoprolol and diuretic therapy.   Hyperlipidemia Continue with statin therapy.         Consultants: none  Procedures performed: none   Disposition: Home Diet recommendation:  Cardiac diet DISCHARGE MEDICATION: Allergies as of 05/29/2023       Reactions   Alpha-gal Other (See Comments)   Avoidance of meat, dairy products, and other products that contain alpha-gal. Individuals diagnosed with AGS should completely avoid the consumption of mammalian meat, including beef, pork, lamb, venison, goat, and rabbit.    Sulfonamide Derivatives Other (See Comments)   Unknown - patient cannot remember the reaction        Medication List     STOP taking these medications    cephALEXin 500 MG capsule Commonly known as: KEFLEX   doxycycline 100 MG tablet Commonly known as: VIBRA-TABS   levofloxacin 500 MG tablet Commonly known as: LEVAQUIN   potassium chloride 10 MEQ tablet Commonly known as: KLOR-CON       TAKE these medications    acetaminophen 325 MG tablet Commonly known as: TYLENOL Take 2 tablets (650 mg total) by mouth every 6 (six) hours as needed for mild pain or moderate pain (or Fever >/= 101).   apixaban 5 MG Tabs tablet Commonly known as: Eliquis Take 1 tablet (5 mg total) by mouth 2 (two) times daily.   ascorbic acid 500 MG tablet Commonly  known as: VITAMIN C Take 500 mg by mouth daily.   b complex vitamins tablet Take 1 tablet by mouth daily.   Calcium Carbonate-Vitamin D 600-400 MG-UNIT tablet Take 1 tablet by mouth daily.   clorazepate 15 MG tablet Commonly known as: TRANXENE TAKE 1 TABLET BY MOUTH EVERYDAY AT BEDTIME What changed: See the new instructions.   cromolyn 5.2 MG/ACT nasal spray Commonly known as: NASALCROM Place 2 sprays into both nostrils at bedtime.   feeding supplement Liqd Take 237 mLs by mouth 2 (two) times daily between meals.   fenofibrate 160 MG tablet TAKE 1 TABLET BY MOUTH EVERY DAY   fish oil-omega-3 fatty acids 1000 MG capsule Take 1 g by mouth 2 (two) times daily.   Jardiance 10 MG Tabs tablet Generic drug: empagliflozin Take 1 tablet (10 mg total) by mouth daily. Start taking on: May 30, 2023   latanoprost 0.005 % ophthalmic solution Commonly known as: XALATAN Place 1 drop into the left eye at bedtime.   magic mouthwash w/lidocaine Soln Take 5 mLs by mouth 4 (four) times daily as needed for mouth pain. Swish and Spit What changed:  reasons to take this additional instructions   metoprolol succinate 25 MG 24 hr tablet Commonly known as: Toprol  XL Take 0.5 tablets (12.5 mg total) by mouth daily. What changed: when to take this   MULTIVITAMIN PO Take 1 tablet by mouth daily with breakfast.   PreserVision AREDS 2 Caps 2 po qd What changed:  how much to take how to take this when to take this additional instructions   simvastatin 10 MG tablet Commonly known as: ZOCOR TAKE 1 TABLET BY MOUTH EVERYDAY AT BEDTIME What changed: See the new instructions.   spironolactone 25 MG tablet Commonly known as: ALDACTONE Take 1/2 tablet (12.5 mg total) by mouth daily. Start taking on: May 30, 2023   tamsulosin 0.4 MG Caps capsule Commonly known as: FLOMAX Take 0.4 mg by mouth at bedtime.   torsemide 20 MG tablet Commonly known as: DEMADEX Take 1 tablet (20 mg total) by  mouth daily. In case of weight gain 2 to 3 lbs in 24 hr or 5 lbs in 7 days take 2 tablets daily until weight back to baseline. What changed: additional instructions   traMADol 50 MG tablet Commonly known as: ULTRAM Take 1 tablet (50 mg total) by mouth every 8 (eight) hours as needed for up to 5 days. What changed:  when to take this reasons to take this        Discharge Exam: Filed Weights   05/27/23 0426 05/28/23 0437 05/29/23 0411  Weight: 62.6 kg 61.1 kg 62 kg   BP 95/70   Pulse (!) 106   Temp 98.1 F (36.7 C) (Oral)   Resp 19   Ht 5\' 6"  (1.676 m)   Wt 62 kg   SpO2 99%   BMI 22.06 kg/m   Patient with no chest pian or dyspnea, no PND, orthopnea or lower extremity edema.  Had hand palm pain at night.   Neurology awake and alert ENT with mild pallor Cardiovascular with S1 and S2 present, irregularly irregular with no gallops or rubs, positive systolic murmur at the apex No JVD No lower extremity edema Respiratory with no rales or wheezing, no rhonchi Abdomen with no distention   Condition at discharge: stable  The results of significant diagnostics from this hospitalization (including imaging, microbiology, ancillary and laboratory) are listed below for reference.   Imaging Studies: ECHOCARDIOGRAM COMPLETE  Result Date: 05/26/2023    ECHOCARDIOGRAM REPORT   Patient Name:   Kenton Mickley. Date of Exam: 05/26/2023 Medical Rec #:  [id number]       Height:       66.0 in Accession #:    [id number]      Weight:       146.4 lb Date of Birth:  27-May-1928        BSA:          1.751 m Patient Age:    89 years        BP:           90/70 mmHg Patient Gender: M               HR:           109 bpm. Exam Location:  Inpatient Procedure: 2D Echo, Color Doppler and Cardiac Doppler Indications:    dyspnea  History:        Patient has prior history of Echocardiogram examinations, most                 recent 08/23/2022. CHF, chronic kidney disease, Arrythmias:Atrial  Fibrillation, Signs/Symptoms:Edema; Risk Factors:Hypertension                 and Dyslipidemia.  Sonographer:    Delcie Roch RDCS Referring Phys: [id number] Amandalee Lacap DANIEL [employer] IMPRESSIONS  1. Left ventricular ejection fraction, by estimation, is 70 to 75%. The left ventricle has hyperdynamic function. The left ventricle has no regional wall motion abnormalities. Left ventricular diastolic parameters are indeterminate.  2. Right ventricular systolic function is normal. The right ventricular size is normal. There is mildly elevated pulmonary artery systolic pressure. The estimated right ventricular systolic pressure is 43.4 mmHg.  3. Left atrial size was severely dilated.  4. Right atrial size was moderately dilated.  5. The mitral valve is normal in structure. Severe mitral valve regurgitation. No evidence of mitral stenosis.  6. The aortic valve is calcified. There is moderate calcification of the aortic valve. There is moderate thickening of the aortic valve. Aortic valve regurgitation is moderate. Aortic valve sclerosis/calcification is present, without any evidence of aortic stenosis. Aortic valve mean gradient measures 6.4 mmHg. Aortic valve Vmax measures 1.69 m/s.  7. The inferior vena cava is dilated in size with >50% respiratory variability, suggesting right atrial pressure of 8 mmHg. Comparison(s): No significant change from prior study. Prior images reviewed side by side. FINDINGS  Left Ventricle: Left ventricular ejection fraction, by estimation, is 70 to 75%. The left ventricle has hyperdynamic function. The left ventricle has no regional wall motion abnormalities. The left ventricular internal cavity size was normal in size. There is no left ventricular hypertrophy. Left ventricular diastolic parameters are indeterminate. Right Ventricle: The right ventricular size is normal. No increase in right ventricular wall thickness. Right ventricular systolic function is normal. There is mildly elevated  pulmonary artery systolic pressure. The tricuspid regurgitant velocity is 3.18  m/s, and with an assumed right atrial pressure of 3 mmHg, the estimated right ventricular systolic pressure is 43.4 mmHg. Left Atrium: Left atrial size was severely dilated. Right Atrium: Right atrial size was moderately dilated. Pericardium: There is no evidence of pericardial effusion. Mitral Valve: The mitral valve is normal in structure. Mild mitral annular calcification. Severe mitral valve regurgitation. No evidence of mitral valve stenosis. Tricuspid Valve: The tricuspid valve is normal in structure. Tricuspid valve regurgitation is mild . No evidence of tricuspid stenosis. Aortic Valve: The aortic valve is calcified. There is moderate calcification of the aortic valve. There is moderate thickening of the aortic valve. Aortic valve regurgitation is moderate. Aortic valve sclerosis/calcification is present, without any evidence of aortic stenosis. Aortic valve mean gradient measures 6.4 mmHg. Aortic valve peak gradient measures 11.5 mmHg. Aortic valve area, by VTI measures 1.50 cm. Pulmonic Valve: The pulmonic valve was normal in structure. Pulmonic valve regurgitation is trivial. No evidence of pulmonic stenosis. Aorta: The aortic root is normal in size and structure. Venous: The inferior vena cava is dilated in size with greater than 50% respiratory variability, suggesting right atrial pressure of 8 mmHg. IAS/Shunts: No atrial level shunt detected by color flow Doppler.  LEFT VENTRICLE PLAX 2D LVIDd:         4.60 cm LVIDs:         2.40 cm LV PW:         1.00 cm LV IVS:        1.00 cm LVOT diam:     1.90 cm LV SV:         39 LV SV Index:   22 LVOT Area:     2.84  cm  RIGHT VENTRICLE             IVC RV Basal diam:  3.40 cm     IVC diam: 2.20 cm RV S prime:     11.40 cm/s TAPSE (M-mode): 1.5 cm LEFT ATRIUM              Index        RIGHT ATRIUM           Index LA diam:        5.30 cm  3.03 cm/m   RA Area:     24.50 cm LA Vol  (A2C):   114.0 ml 65.09 ml/m  RA Volume:   76.00 ml  43.39 ml/m LA Vol (A4C):   117.0 ml 66.80 ml/m LA Biplane Vol: 122.0 ml 69.66 ml/m  AORTIC VALVE AV Area (Vmax):    1.41 cm AV Area (Vmean):   1.34 cm AV Area (VTI):     1.50 cm AV Vmax:           169.40 cm/s AV Vmean:          117.400 cm/s AV VTI:            0.259 m AV Peak Grad:      11.5 mmHg AV Mean Grad:      6.4 mmHg LVOT Vmax:         84.22 cm/s LVOT Vmean:        55.300 cm/s LVOT VTI:          0.137 m LVOT/AV VTI ratio: 0.53  AORTA Ao Root diam: 3.30 cm Ao Asc diam:  3.70 cm MR Peak grad:    88.4 mmHg    TRICUSPID VALVE MR Mean grad:    58.0 mmHg    TR Peak grad:   40.4 mmHg MR Vmax:         470.00 cm/s  TR Vmax:        318.00 cm/s MR Vmean:        365.0 cm/s MR PISA:         3.08 cm     SHUNTS MR PISA Eff ROA: 25 mm       Systemic VTI:  0.14 m MR PISA Radius:  0.70 cm      Systemic Diam: 1.90 cm Donato Schultz MD Electronically signed by Donato Schultz MD Signature Date/Time: 05/26/2023/6:18:43 PM    Final    DG Chest 2 View  Result Date: 05/25/2023 CLINICAL DATA:  Extremity swelling EXAM: CHEST - 2 VIEW COMPARISON:  Chest x-ray dated May 21st 2024 FINDINGS: The heart size and mediastinal contours are within normal limits. Unchanged elevation of the right hemidiaphragm. Both lungs are clear. The visualized skeletal structures are unremarkable. IMPRESSION: No active cardiopulmonary disease. Electronically Signed   By: Allegra Lai M.D.   On: 05/25/2023 19:36   US Venous Img Lower Unilateral Right (DVT)  Result Date: 05/25/2023 CLINICAL DATA:  Right calf swelling and weeping for the past week. History of prostate cancer. Evaluate for DVT. EXAM: RIGHT LOWER EXTREMITY VENOUS DOPPLER ULTRASOUND TECHNIQUE: Gray-scale sonography with graded compression, as well as color Doppler and duplex ultrasound were performed to evaluate the lower extremity deep venous systems from the level of the common femoral vein and including the common femoral, femoral,  profunda femoral, popliteal and calf veins including the posterior tibial, peroneal and gastrocnemius veins when visible. The superficial great saphenous vein was also interrogated. Spectral Doppler was utilized to evaluate flow at rest  and with distal augmentation maneuvers in the common femoral, femoral and popliteal veins. COMPARISON:  None Available. FINDINGS: Contralateral Common Femoral Vein: Respiratory phasicity is normal and symmetric with the symptomatic side. No evidence of thrombus. Normal compressibility. Common Femoral Vein: No evidence of thrombus. Normal compressibility, respiratory phasicity and response to augmentation. Saphenofemoral Junction: No evidence of thrombus. Normal compressibility and flow on color Doppler imaging. Profunda Femoral Vein: No evidence of thrombus. Normal compressibility and flow on color Doppler imaging. Femoral Vein: No evidence of thrombus. Normal compressibility, respiratory phasicity and response to augmentation. Popliteal Vein: No evidence of thrombus. Normal compressibility, respiratory phasicity and response to augmentation. Calf Veins: No evidence of thrombus. Normal compressibility and flow on color Doppler imaging. Superficial Great Saphenous Vein: No evidence of thrombus. Normal compressibility. Other Findings: Note is made of a mildly prominent though non pathologically enlarged right inguinal lymph node which measures 0.7 cm in diameter, presumably reactive in etiology. Sonographic evaluation of patient's area of discomfort involving the medial aspect of the right mid thigh correlates with an indeterminate 0.7 x 0.6 x 0.5 cm minimally complex subcutaneous cystic nodule (images 32 through 38). This nodule does not demonstrate communication with the overlying dermal surface. No blood flow is demonstrated within this nodule. This nodule does not appear to communicate with adjacent vascular structures (as confirmed on cine series 2) Note is made of a 4.4 x 3.2 x  1.0 cm minimally complex right-sided Baker's cyst. There is a moderate-to-large amount of subcutaneous edema at the level of the right lower leg and calf. IMPRESSION: 1. No evidence of DVT within right lower extremity. 2. Sonographic evaluation of the patient's area of discomfort involving medial aspect of the right mid thigh correlates with a 0.7 cm minimally complex subcutaneous cystic nodule, indeterminate and of doubtful clinical significance with differential considerations including a hematoma versus seroma or abscess. 3. Note made of a 4.4 cm minimally complex right-sided Baker's cyst. Electronically Signed   By: Simonne Come M.D.   On: 05/25/2023 13:29   DG Chest 2 View  Result Date: 05/15/2023 CLINICAL DATA:  Bilateral hand and leg swelling. EXAM: CHEST - 2 VIEW COMPARISON:  Chest x-ray dated November 24, 2022. FINDINGS: The heart size and mediastinal contours are within normal limits. Normal pulmonary vascularity. Unchanged eventration of the right hemidiaphragm. No focal consolidation, pleural effusion, or pneumothorax. No acute osseous abnormality. IMPRESSION: No active cardiopulmonary disease. Electronically Signed   By: Obie Dredge M.D.   On: 05/15/2023 14:56    Microbiology: Results for orders placed or performed during the hospital encounter of 05/25/23  Blood culture (routine x 2)     Status: None (Preliminary result)   Collection Time: 05/25/23  9:20 PM   Specimen: BLOOD  Result Value Ref Range Status   Specimen Description   Final    BLOOD RIGHT ANTECUBITAL Performed at [facility], [address] [place], [place] [address]    Special Requests   Final    BOTTLES DRAWN AEROBIC AND ANAEROBIC Blood Culture adequate volume Performed at [facility], [address], [place], [place] [address]    Culture   Final    NO GROWTH 3 DAYS Performed at [facility], [address], [place], [place] [address]    Report Status PENDING  Incomplete  Blood  culture (routine x 2)     Status: None (Preliminary result)   Collection Time: 05/25/23  9:21 PM   Specimen: BLOOD  Result  Value Ref Range Status   Specimen Description   Final    BLOOD BLOOD LEFT HAND Performed at [facility], [address] [place], [place] [address]    Special Requests   Final    BOTTLES DRAWN AEROBIC AND ANAEROBIC Blood Culture adequate volume Performed at [facility], [address] [place], [place] [address]    Culture   Final    NO GROWTH 3 DAYS Performed at [facility], [address], [place], [place] [address]    Report Status PENDING  Incomplete    Labs: CBC: Recent Labs  Lab 05/25/23 1217 05/25/23 1853 05/26/23 0820 05/27/23 0206 05/28/23 0251  WBC 9.8 11.6* 8.3 8.8 11.6*  NEUTROABS 7.2 9.3*  --   --   --   HGB 14.0 14.4 13.5 13.6 14.2  HCT 42.8 41.9 38.5* 39.9 41.8  MCV 92.8 89.1 90.4 90.7 90.5  PLT 297.0 278 242 251 262   Basic Metabolic Panel: Recent Labs  Lab 05/26/23 0820 05/26/23 1438 05/27/23 0206 05/28/23 0251 05/29/23 0337  NA 127* 127* 128* 132* 132*  K 4.1 4.4 4.0 4.0 4.3  CL 92* 93* 93* 94* 97*  CO2 25 25 25 28 26   GLUCOSE 90 90 104* 111* 108*  BUN 27* 28* 34* 41* 45*  CREATININE 1.22 1.33* 1.24 1.26* 1.23  CALCIUM 8.4* 8.4* 8.4* 8.7* 8.7*  MG  --   --  2.1  --   --    Liver Function Tests: Recent Labs  Lab 05/25/23 1217 05/25/23 1853  AST 29 37  ALT 23 27  ALKPHOS 54 55  BILITOT 1.3* 1.3*  PROT 6.1 6.3*  ALBUMIN 3.6 3.4*   CBG: No results for input(s): "GLUCAP" in the last 168 hours.  Discharge time spent: greater than 30 minutes.  Signed: Coralie Keens, MD [facility] 05/29/2023

## 2023-05-29 NOTE — TOC Transition Note (Signed)
Transition of Care [facility]) - CM/SW Discharge Note   Patient Details  Name: Mark Stark. MRN: [id number] Date of Birth: 1928-09-06  [facility] CM/SW Contact:  Lawerance Sabal, RN Phone Number: 05/29/2023, 10:47 AM   Clinical Narrative:     [name] w patient at length at bedside, and updated his friend [name] at [phone] of DC plan at his request.   Patient states that he is from home, lives on [facility] [address] [facility] [address] and is a retired professor. He lives alone.   He has all needed DME at home from his deceased wife, and states his bathroom is accessible.   His close friends [name] and [name] are on vacation for the next +/- week.   He will have other friends available today to assist with DC to home.   Will DC with meds through [facility] pharmacy.   We discussed HH services, and he does not have a preference for agency. I told him I would find an agency that would guarantee a start of care for tomorrow, [name] was able to accept and ensure this.   I instructed liaison that patient is [facility] and that if he does not answer phone it would be ok to go and he would answer the door. I have also provided them with [name]'s number, and [name] provided with office number.   [name] very appreciative for coordination of care.    Final next level of care: Home w Home Health Services Barriers to Discharge: No Barriers Identified   Patient Goals and CMS Choice CMS Medicare.gov Compare Post Acute Care list provided to:: Patient Choice offered to / list presented to : Patient  Discharge Placement                         Discharge Plan and Services Additional resources added to the After Visit Summary for                  DME Arranged: N/A         HH Arranged: RN, PT, OT, Social Work [employer] Agency: [facility] Date [facility] Agency Contacted: 05/29/23 Time HH Agency Contacted: 1047 Representative spoke with at [facility] Agency: Kandee Keen  Social Determinants of Health  (SDOH) Interventions SDOH Screenings   Food Insecurity: No Food Insecurity (05/26/2023)  Housing: Low Risk  (05/26/2023)  Transportation Needs: No Transportation Needs (05/26/2023)  Utilities: Not At Risk (05/26/2023)  Alcohol Screen: Low Risk  (02/19/2023)  Depression (PHQ2-9): Medium Risk (05/15/2023)  Financial Resource Strain: Low Risk  (02/17/2022)  Physical Activity: Insufficiently Active (05/15/2023)  Social Connections: Moderately Integrated (09/26/2022)  Recent Concern: Social Connections - Moderately Isolated (08/29/2022)  Stress: Stress Concern Present (05/15/2023)  Tobacco Use: Low Risk  (05/26/2023)     Readmission Risk Interventions     No data to display

## 2023-05-29 NOTE — Progress Notes (Signed)
Mobility Specialist Progress Note   05/29/23 1236  Mobility  Activity Ambulated with assistance in hallway  Level of Assistance Standby assist, set-up cues, supervision of patient - no hands on  Assistive Device Front wheel walker  Distance Ambulated (ft) 480 ft  Range of Motion/Exercises Active;All extremities  Activity Response Tolerated well   Patient received in supine, sleep and easily aroused.  Agreeable to participate despite working with PT not long before writers entry. Ambulated mod I with slow steady gait. Returned to room without complaint or incident. Was left in bathroom with all needs met, call bell in reach.   Mark Stark Strength, BS EXP Mobility Specialist Please contact via SecureChat or Rehab office at [phone]

## 2023-05-29 NOTE — Progress Notes (Signed)
Physical Therapy Treatment Patient Details Name: Mark Stark. MRN: [id number] DOB: 1928-05-18 Today's Date: 05/29/2023   History of Present Illness 89 yo male presenting with low sodium swelling and R LE redness. Workup underway for R LE cellulitis, acute on chronic CHF and hyponatremia PMH HLD HTN Afib on eliquis CKDIII CHF diverticulitis aortic insufficiency prostate CA    PT Comments    Pt received in supine and agreeable to session. Pt making good progress towards functional mobility goals this session. Pt able to tolerate significantly increased gait distance with HR noted as high as 135bpm. Pt reports no DOE or need for rest breaks during trial. Pt reports no concerns about his home environment and states that he has his wife's RW that he can use. Education on using the RW at home for balance to reduce fall risk and activity progression. Acutely, pt continues to benefit from PT services to progress toward functional mobility goals.     Recommendations for follow up therapy are one component of a multi-disciplinary discharge planning process, led by the attending physician.  Recommendations may be updated based on patient status, additional functional criteria and insurance authorization.     Assistance Recommended at Discharge PRN  Patient can return home with the following Assistance with cooking/housework;Assist for transportation   Equipment Recommendations  Rolling walker (2 wheels);BSC/3in1    Recommendations for Other Services       Precautions / Restrictions Precautions Precautions: Fall Restrictions Weight Bearing Restrictions: No     Mobility  Bed Mobility Overal bed mobility: Modified Independent                  Transfers Overall transfer level: Needs assistance Equipment used: Rolling walker (2 wheels) Transfers: Sit to/from Stand Sit to Stand: Supervision           General transfer comment: cues for hand placement     Ambulation/Gait Ambulation/Gait assistance: Supervision Gait Distance (Feet): 380 Feet Assistive device: Rolling walker (2 wheels) Gait Pattern/deviations: Step-through pattern, Trunk flexed       General Gait Details: Cues for RW proximity and upright posture. Pt reporting no DOE or need for rest breaks      Balance Overall balance assessment: Mild deficits observed, not formally tested                                          Cognition Arousal/Alertness: Awake/alert Behavior During Therapy: WFL for tasks assessed/performed Overall Cognitive Status: Within Functional Limits for tasks assessed                                          Exercises      General Comments        Pertinent Vitals/Pain Pain Assessment Pain Assessment: No/denies pain     PT Goals (current goals can now be found in the care plan section) Acute Rehab PT Goals Patient Stated Goal: Did not specifically state PT Goal Formulation: With patient Time For Goal Achievement: 06/10/23 Potential to Achieve Goals: Good Progress towards PT goals: Progressing toward goals    Frequency    Min 3X/week      PT Plan Current plan remains appropriate       AM-PAC PT "6 Clicks" Mobility   Outcome Measure  Help needed turning  from your back to your side while in a flat bed without using bedrails?: None Help needed moving from lying on your back to sitting on the side of a flat bed without using bedrails?: None Help needed moving to and from a bed to a chair (including a wheelchair)?: A Little Help needed standing up from a chair using your arms (e.g., wheelchair or bedside chair)?: A Little Help needed to walk in hospital room?: A Little Help needed climbing 3-5 steps with a railing? : A Little 6 Click Score: 20    End of Session Equipment Utilized During Treatment: Gait belt Activity Tolerance: Patient tolerated treatment well Patient left: in bed;with call  bell/phone within reach;with bed alarm set Nurse Communication: Mobility status PT Visit Diagnosis: Unsteadiness on feet (R26.81);Muscle weakness (generalized) (M62.81)     Time: 0254-2706 PT Time Calculation (min) (ACUTE ONLY): 23 min  Charges:  $Gait Training: 23-37 mins                     Johny Shock, PTA Acute Rehabilitation Services Secure Chat Preferred  [phone]    Johny Shock 05/29/2023, 12:21 PM

## 2023-05-29 NOTE — Care Management Important Message (Signed)
Important Message  Patient Details  Name: Mark Stark. MRN: [id number] Date of Birth: 1928-12-01   Medicare Important Message Given:  Yes     Sherilyn Banker 05/29/2023, 4:27 PM

## 2023-05-30 DIAGNOSIS — Z7901 Long term (current) use of anticoagulants: Secondary | ICD-10-CM | POA: Diagnosis not present

## 2023-05-30 DIAGNOSIS — N1831 Chronic kidney disease, stage 3a: Secondary | ICD-10-CM | POA: Diagnosis not present

## 2023-05-30 DIAGNOSIS — Z79891 Long term (current) use of opiate analgesic: Secondary | ICD-10-CM | POA: Diagnosis not present

## 2023-05-30 DIAGNOSIS — I130 Hypertensive heart and chronic kidney disease with heart failure and stage 1 through stage 4 chronic kidney disease, or unspecified chronic kidney disease: Secondary | ICD-10-CM | POA: Diagnosis not present

## 2023-05-30 DIAGNOSIS — E785 Hyperlipidemia, unspecified: Secondary | ICD-10-CM | POA: Diagnosis not present

## 2023-05-30 DIAGNOSIS — L03115 Cellulitis of right lower limb: Secondary | ICD-10-CM | POA: Diagnosis not present

## 2023-05-30 DIAGNOSIS — I4819 Other persistent atrial fibrillation: Secondary | ICD-10-CM | POA: Diagnosis not present

## 2023-05-30 DIAGNOSIS — I340 Nonrheumatic mitral (valve) insufficiency: Secondary | ICD-10-CM | POA: Diagnosis not present

## 2023-05-30 DIAGNOSIS — I5033 Acute on chronic diastolic (congestive) heart failure: Secondary | ICD-10-CM | POA: Diagnosis not present

## 2023-05-30 DIAGNOSIS — Z7984 Long term (current) use of oral hypoglycemic drugs: Secondary | ICD-10-CM | POA: Diagnosis not present

## 2023-05-30 LAB — CULTURE, BLOOD (ROUTINE X 2): Culture: NO GROWTH

## 2023-05-30 LAB — LYME DISEASE DNA BY PCR(BORRELIA BURG): Lyme Disease(B.burgdorferi)PCR: NEGATIVE

## 2023-05-31 ENCOUNTER — Telehealth: Payer: Self-pay | Admitting: *Deleted

## 2023-05-31 ENCOUNTER — Telehealth: Payer: Self-pay | Admitting: Family Medicine

## 2023-05-31 ENCOUNTER — Encounter: Payer: Self-pay | Admitting: *Deleted

## 2023-05-31 DIAGNOSIS — N1831 Chronic kidney disease, stage 3a: Secondary | ICD-10-CM | POA: Diagnosis not present

## 2023-05-31 DIAGNOSIS — L03115 Cellulitis of right lower limb: Secondary | ICD-10-CM | POA: Diagnosis not present

## 2023-05-31 DIAGNOSIS — I4819 Other persistent atrial fibrillation: Secondary | ICD-10-CM | POA: Diagnosis not present

## 2023-05-31 DIAGNOSIS — Z7984 Long term (current) use of oral hypoglycemic drugs: Secondary | ICD-10-CM | POA: Diagnosis not present

## 2023-05-31 DIAGNOSIS — I130 Hypertensive heart and chronic kidney disease with heart failure and stage 1 through stage 4 chronic kidney disease, or unspecified chronic kidney disease: Secondary | ICD-10-CM | POA: Diagnosis not present

## 2023-05-31 DIAGNOSIS — I5033 Acute on chronic diastolic (congestive) heart failure: Secondary | ICD-10-CM | POA: Diagnosis not present

## 2023-05-31 NOTE — Telephone Encounter (Signed)
Rechetta from [facility] has called to inform us that the pt is experiencing severe numbness in his hand at night that is preventing him from sleeping. This is also making the patient a little depressed and that the pt feels like no one is addressing the issue. Please advise.  Rechetta: [phone]

## 2023-05-31 NOTE — Transitions of Care (Post Inpatient/ED Visit) (Signed)
   05/31/2023  Name: Mark Stark. MRN: [id number] DOB: 07-23-1928  Today's TOC FU Call Status: Today's TOC FU Call Status:: Unsuccessul Call (1st Attempt) Unsuccessful Call (1st Attempt) Date: 05/31/23  Attempted to reach the patient regarding the most recent Inpatient visit; left HIPAA compliant voice message requesting call back  Follow Up Plan: Additional outreach attempts will be made to reach the patient to complete the Transitions of Care (Post Inpatient visit) call.   Caryl Pina, RN, BSN, CCRN Alumnus RN CM Care Coordination/ Transition of Care- [facility] Care Management [phone]: direct office

## 2023-06-01 ENCOUNTER — Telehealth: Payer: Self-pay | Admitting: Family Medicine

## 2023-06-01 ENCOUNTER — Telehealth: Payer: Self-pay | Admitting: *Deleted

## 2023-06-01 ENCOUNTER — Encounter: Payer: Self-pay | Admitting: *Deleted

## 2023-06-01 ENCOUNTER — Other Ambulatory Visit: Payer: Self-pay | Admitting: Family Medicine

## 2023-06-01 DIAGNOSIS — Z7984 Long term (current) use of oral hypoglycemic drugs: Secondary | ICD-10-CM | POA: Diagnosis not present

## 2023-06-01 DIAGNOSIS — R200 Anesthesia of skin: Secondary | ICD-10-CM | POA: Insufficient documentation

## 2023-06-01 DIAGNOSIS — I130 Hypertensive heart and chronic kidney disease with heart failure and stage 1 through stage 4 chronic kidney disease, or unspecified chronic kidney disease: Secondary | ICD-10-CM | POA: Diagnosis not present

## 2023-06-01 DIAGNOSIS — L03115 Cellulitis of right lower limb: Secondary | ICD-10-CM | POA: Diagnosis not present

## 2023-06-01 DIAGNOSIS — N1831 Chronic kidney disease, stage 3a: Secondary | ICD-10-CM | POA: Diagnosis not present

## 2023-06-01 DIAGNOSIS — I5033 Acute on chronic diastolic (congestive) heart failure: Secondary | ICD-10-CM | POA: Diagnosis not present

## 2023-06-01 DIAGNOSIS — I4819 Other persistent atrial fibrillation: Secondary | ICD-10-CM | POA: Diagnosis not present

## 2023-06-01 NOTE — Progress Notes (Signed)
  Care Coordination   Note   06/01/2023 Name: Schyler Counsell. MRN: [id number] DOB: 10-31-1928  [name] is a 89 y.o. year old male who sees Zola Button, Grayling Congress, DO for primary care. I reached out to [name] by phone today to offer care coordination services.  Mr. Harr was given information about Care Coordination services today including:   The Care Coordination services include support from the care team which includes your Nurse Coordinator, Clinical Social Worker, or Pharmacist.  The Care Coordination team is here to help remove barriers to the health concerns and goals most important to you. Care Coordination services are voluntary, and the patient may decline or stop services at any time by request to their care team member.   Care Coordination Consent Status: Patient agreed to services and verbal consent obtained. Per Su Hilt   Follow up plan:  Telephone appointment with care coordination team member scheduled for:  06/18/23  Encounter Outcome:  Pt. Scheduled  [facility] Coordination Care Guide  Direct Dial: [phone]

## 2023-06-01 NOTE — Telephone Encounter (Signed)
Sharee from [facility] has called requesting verbal orders for physical therapy: 1 week 1, 2 week 2, and 1 week 2.  Also wants to inform us that is weight has been fluctuating over the last few days. Wednesday: 130 Thursday: 136.4 Today:137.4 Wants to know if pt should take an extra dose of TORSEMIDE due to the weight? Please advise. Also wanted to inform us that he has pain in both hands while sleeping.  Sharee: [phone]

## 2023-06-01 NOTE — Transitions of Care (Post Inpatient/ED Visit) (Signed)
06/01/2023  Name: Mark Stark. MRN: [id number] DOB: [birth year]  Today's TOC FU Call Status: Today's TOC FU Call Status:: Successful TOC FU Call Competed TOC FU Call Complete Date: 06/01/23  Transition Care Management Follow-up Telephone Call Date of Discharge: 05/29/23 Discharge Facility: [facility]) Type of Discharge: Inpatient Admission Primary Inpatient Discharge Diagnosis:: hyponatremia; (R) LE cellulitis How have you been since you were released from the hospital?: Better ("Okay but not so well; I am weak and tired.  I am having trouble with hand pain and numbness when I lie down at night.  Its been going on for awhile now.") Any questions or concerns?: No  Items Reviewed: Did you receive and understand the discharge instructions provided?: Yes Medications obtained,verified, and reconciled?: Partial Review Completed Reason for Partial Mediation Review: patient declined full medication review stating that home health has already reviewed and he has another inportant phone call to make; states he knows his medications and independently takes as prescribed Any new allergies since your discharge?: No Dietary orders reviewed?: Yes Type of Diet Ordered:: "As healthy as possible" Do you have support at home?: Yes People in Home: alone Name of Support/Comfort Primary Source: Reports independent in self-care activities; resides alone; reports "have lots of friends who" assists as/ if needed/ indicated  Medications Reviewed Today: Medications Reviewed Today     Reviewed by Michaela Corner, RN (Registered Nurse) on 06/01/23 at 1102  Med List Status: <None>   Medication Order Taking? Sig Documenting Provider Last Dose Status Informant  acetaminophen (TYLENOL) 325 MG tablet [id number]  Take 2 tablets (650 mg total) by mouth every 6 (six) hours as needed for mild pain or moderate pain (or Fever >/= 101). Arrien, York Ram, MD  Active   apixaban (ELIQUIS) 5 MG TABS tablet [id number]   Take 1 tablet (5 mg total) by mouth 2 (two) times daily. Donato Schultz, DO  Active Self  b complex vitamins tablet [id number]  Take 1 tablet by mouth daily. [provider]  Active Self  Calcium Carbonate-Vitamin D 600-400 MG-UNIT tablet [id number]  Take 1 tablet by mouth daily. [provider]  Active Self  clorazepate (TRANXENE) 15 MG tablet [id number]  TAKE 1 TABLET BY MOUTH EVERYDAY AT BEDTIME  Patient taking differently: Take 15 mg by mouth at bedtime.   Donato Schultz, DO  Active Self  cromolyn (NASALCROM) 5.2 MG/ACT nasal spray [id number]  Place 2 sprays into both nostrils at bedtime. [provider]  Active Self  empagliflozin (JARDIANCE) 10 MG TABS tablet [id number] Yes Take 1 tablet (10 mg total) by mouth daily. Arrien, York Ram, MD Taking Active   feeding supplement (ENSURE ENLIVE / ENSURE PLUS) LIQD [id number]  Take 237 mLs by mouth 2 (two) times daily between meals. Arrien, York Ram, MD  Active   fenofibrate 160 MG tablet [id number]  TAKE 1 TABLET BY MOUTH EVERY DAY  Patient taking differently: Take 160 mg by mouth daily.   Donato Schultz, DO  Active Self  fish oil-omega-3 fatty acids 1000 MG capsule [id number]  Take 1 g by mouth 2 (two) times daily. [provider]  Active Self           Med Note Salvatore Marvel   Sat May 26, 2023  7:13 PM)    latanoprost (XALATAN) 0.005 % ophthalmic solution [id number]  Place 1 drop into the left eye at bedtime. [provider]  Active Self  magic mouthwash w/lidocaine SOLN  [id number]  Take 5 mLs by mouth 4 (four) times daily as needed for mouth pain. Swish and Spit  Patient taking differently: Take 5 mLs by mouth 4 (four) times daily as needed for mouth pain (SWISH AND SPIT).   Donato Schultz, DO  Active Self  metoprolol succinate (TOPROL XL) 25 MG 24 hr tablet [id number]  Take 0.5 tablets (12.5 mg total) by mouth daily.  Patient taking differently: Take 12.5 mg by mouth at  bedtime.   Jodelle Red, MD  Active Self  Multiple Vitamins-Minerals (MULTIVITAMIN PO) [id number]  Take 1 tablet by mouth daily with breakfast. [provider]  Active Self  Multiple Vitamins-Minerals (PRESERVISION AREDS 2) CAPS [id number]  2 po qd  Patient taking differently: Take 1 capsule by mouth in the morning and at bedtime.   Donato Schultz, DO  Active Self  simvastatin (ZOCOR) 10 MG tablet [id number]  TAKE 1 TABLET BY MOUTH EVERYDAY AT BEDTIME  Patient taking differently: Take 10 mg by mouth in the morning.   Jodelle Red, MD  Active Self  spironolactone (ALDACTONE) 25 MG tablet [id number] Yes Take 1/2 tablet (12.5 mg total) by mouth daily. Arrien, York Ram, MD Taking Active   tamsulosin [facility]) 0.4 MG CAPS capsule [id number]  Take 0.4 mg by mouth at bedtime. [provider]  Active Self  torsemide (DEMADEX) 20 MG tablet [id number] Yes Take 1 tablet (20 mg total) by mouth daily. In case of weight gain 2 to 3 lbs in 24 hr or 5 lbs in 7 days take 2 tablets daily until weight back to baseline. Arrien, York Ram, MD Taking Active   vitamin C (ASCORBIC ACID) 500 MG tablet [id number]  Take 500 mg by mouth daily. [provider]  Active Self            Home Care and Equipment/Supplies: Were Home Health Services Ordered?: Yes Name of Home Health Agency:: [facility] Has Agency set up a time to come to your home?: Yes First Home Health Visit Date: 05/31/23 Any new equipment or medical supplies ordered?: No  Functional Questionnaire: Do you need assistance with bathing/showering or dressing?: No Do you need assistance with meal preparation?: No Do you need assistance with eating?: No Do you have difficulty maintaining continence: No Do you need assistance with getting out of bed/getting out of a chair/moving?: No Do you have difficulty managing or taking your medications?: No  Follow up appointments reviewed: PCP Follow-up  appointment confirmed?: No (Care coordination outreach in real time with scheduling care guide to facilitate scheduling of HFU-- provider out of office; patient adamantly declines seeing alternate provider at PCP practice; has routine follow up visit 06/11/23 states he will attend) MD Provider Line Number:[phone] Given: No (verified well-established with current PCP) Specialist Hospital Follow-up appointment confirmed?: NA (verified not indicated per hospital discharging provider discharge notes) Do you need transportation to your follow-up appointment?: No Do you understand care options if your condition(s) worsen?: Yes-patient verbalized understanding  SDOH Interventions Today    Flowsheet Row Most Recent Value  SDOH Interventions   Food Insecurity Interventions Intervention Not Indicated  Transportation Interventions Intervention Not Indicated  ["many friends" provide transportation]      TOC Interventions Today    Flowsheet Row Most Recent Value  TOC Interventions   TOC Interventions Discussed/Reviewed TOC Interventions Discussed  [patient declined assistance with scheduling sooner PCP appointment- only wants to see established PCP and no covering providers]      Interventions Today  Flowsheet Row Most Recent Value  Chronic Disease   Chronic disease during today's visit Other  [hyponatremia,  (R) LE cellulitis]  General Interventions   General Interventions Discussed/Reviewed General Interventions Discussed, Doctor Visits, Referral to Nurse, Communication with  Lavinia Sharps coordination outreach to schedule patient for RN CM outreach on 06/18/23]  Doctor Visits Discussed/Reviewed Doctor Visits Discussed, Specialist, PCP  PCP/Specialist Visits Compliance with follow-up visit  Communication with RN  Education Interventions   Provided Verbal Education On Nutrition  Nutrition Interventions   Nutrition Discussed/Reviewed Nutrition Discussed  Pharmacy Interventions   Pharmacy  Dicussed/Reviewed Pharmacy Topics Discussed      Caryl Pina, RN, BSN, CCRN Alumnus RN CM Care Coordination/ Transition of Care- [facility] Care Management [phone]: direct office

## 2023-06-01 NOTE — Telephone Encounter (Signed)
Rachetta notified that referral has been put in.

## 2023-06-04 NOTE — Telephone Encounter (Signed)
Spoke with Chase Picket and she would like to know how long to take and what is pt base weight (what should wt be).  Verbal orders given for PT and also advised for pt to take extra dose of torsemide.

## 2023-06-04 NOTE — Telephone Encounter (Signed)
Sharee called back stating the following info regarding pt:  Weight:  6.8.24 - 138 6.9.24 - 136 6.10.24 - 133  Pt feels weak due to lack of sleep. Pt is having pain in joints (pain 10/10) that is keeping him up at night.

## 2023-06-05 NOTE — Telephone Encounter (Signed)
Pt doesn't see cardiology until 06/25/23 but also does have a follow up with pcp on 06/11/23

## 2023-06-05 NOTE — Telephone Encounter (Signed)
Pt stated not feeling better , appt scheduled with Dr.Wendling for 06/06/23 , also advised if still not feeling better go to ED

## 2023-06-06 ENCOUNTER — Encounter: Payer: Self-pay | Admitting: Family Medicine

## 2023-06-06 ENCOUNTER — Ambulatory Visit (INDEPENDENT_AMBULATORY_CARE_PROVIDER_SITE_OTHER): Payer: Medicare Other | Admitting: Family Medicine

## 2023-06-06 VITALS — BP 122/88 | HR 44 | Temp 97.8°F | Ht 66.0 in | Wt 140.5 lb

## 2023-06-06 DIAGNOSIS — G4700 Insomnia, unspecified: Secondary | ICD-10-CM | POA: Diagnosis not present

## 2023-06-06 DIAGNOSIS — M79642 Pain in left hand: Secondary | ICD-10-CM | POA: Diagnosis not present

## 2023-06-06 DIAGNOSIS — R208 Other disturbances of skin sensation: Secondary | ICD-10-CM

## 2023-06-06 DIAGNOSIS — M79641 Pain in right hand: Secondary | ICD-10-CM | POA: Diagnosis not present

## 2023-06-06 NOTE — Addendum Note (Signed)
Addended by: Radene Gunning on: 06/06/2023 04:47 PM   Modules accepted: Level of Service

## 2023-06-06 NOTE — Progress Notes (Signed)
Chief Complaint  Patient presents with   Fatigue    Recent test results from PCP Intense pain in both hands    Insomnia    Subjective: Patient is a 89 y.o. male here for sleep issues. Here w 2 friends who help w hx.   4 weeks ago, started having issues sleeping. Sleeping 3-4 hrs nightly. Has been having thin/peeling skin of his hans along with burning. He had associated swelling with his hands. No fevers. 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 No new lotions, soaps, topicals, detergents. Tried using Aquaphor and petroleum jelly without relief. Sleep is his biggest issue right now. Compliant w torsemide, urinates around 30 min after taking med.  The swelling is gone down and the peeling skin had ensued.  He has no current symptoms right now other than peeling skin.  Past Medical History:  Diagnosis Date   Acute diastolic CHF (congestive heart failure) (HCC) 10/25/2021   Anxiety    Aortic insufficiency    Atrial fibrillation (HCC)    Chronic diastolic CHF (congestive heart failure) (HCC)    Chronic kidney disease, stage 3a (HCC)    Diverticulitis    Dyslipidemia    Hypertension    Inferior mesenteric vein thrombosis (HCC)    Mitral regurgitation    Nonrheumatic mitral valve regurgitation 10/28/2021   Paroxysmal atrial fibrillation (HCC) 01/12/2018   Persistent atrial fibrillation (HCC)    Prostate cancer (HCC)     Objective: BP 122/88 (BP Location: Left Arm, Patient Position: Sitting, Cuff Size: Normal)   Pulse (!) 44   Temp 97.8 F (36.6 C) (Oral)   Ht 5\' 6"  (1.676 m)   Wt 140 lb 8 oz (63.7 kg)   SpO2 94%   BMI 22.68 kg/m  General: Awake, appears stated age MSK: No TTP over the joints of the hands or musculature; there is no atrophy or asymmetry Skin: Peeling of the skin of the palmar surfaces bilaterally of the hand.  There is no excessive warmth, fluctuance, drainage, or edema. Neuro: Grip strength adequate, sensation intact to light touch; negative  Tinel's and Phalen's bilaterally Lungs: No accessory muscle use Psych: Age appropriate judgment and insight, normal affect and mood  Assessment and Plan: Insomnia, unspecified type  Burning sensation - Plan: CBC, Comprehensive metabolic panel, T4, free, B12  Bilateral hand pain - Plan: Ambulatory referral to Hand Surgery  Insomnia due to the burning.  Will check some metabolic causes for this through blood work.  Refer to Dr. Butler Denmark of the hand team.  He has a follow-up with his PCP in 5 days which I encouraged him to keep.  I did offer him a neuropathic medication, Effexor, which he politely declined.  He will let me know if he changes his mind before he sees his PCP. The patient and his friends voiced understanding and agreement to the plan.  I spent 30 minutes with the patient discussing the above plans/options in addition to reviewing his chart on the same date of visit.  Tuvia Woodrick Fillmore, DO 06/06/23  4:46 PM

## 2023-06-06 NOTE — Patient Instructions (Addendum)
Give [place] 2-3 business days to get the results of your labs back.   OK to take Tylenol 1000 mg (2 extra strength tabs) or 975 mg (3 regular strength tabs) every 6 hours as needed.  Ice/cold pack over area for 10-15 min twice daily.  If you change your mind about starting a medication to help with your hands, let me know.   If you do not hear anything about your referral in the next 1-2 weeks, call our office and ask for an update.  Let us know if you need anything.

## 2023-06-07 ENCOUNTER — Ambulatory Visit (HOSPITAL_BASED_OUTPATIENT_CLINIC_OR_DEPARTMENT_OTHER): Payer: Medicare Other | Admitting: Orthopaedic Surgery

## 2023-06-07 ENCOUNTER — Telehealth: Payer: Self-pay | Admitting: Family Medicine

## 2023-06-07 DIAGNOSIS — I5033 Acute on chronic diastolic (congestive) heart failure: Secondary | ICD-10-CM | POA: Diagnosis not present

## 2023-06-07 DIAGNOSIS — I4819 Other persistent atrial fibrillation: Secondary | ICD-10-CM | POA: Diagnosis not present

## 2023-06-07 DIAGNOSIS — I130 Hypertensive heart and chronic kidney disease with heart failure and stage 1 through stage 4 chronic kidney disease, or unspecified chronic kidney disease: Secondary | ICD-10-CM | POA: Diagnosis not present

## 2023-06-07 DIAGNOSIS — L03115 Cellulitis of right lower limb: Secondary | ICD-10-CM | POA: Diagnosis not present

## 2023-06-07 DIAGNOSIS — Z7984 Long term (current) use of oral hypoglycemic drugs: Secondary | ICD-10-CM | POA: Diagnosis not present

## 2023-06-07 DIAGNOSIS — N1831 Chronic kidney disease, stage 3a: Secondary | ICD-10-CM | POA: Diagnosis not present

## 2023-06-07 LAB — CBC
HCT: 42.7 % (ref 39.0–52.0)
Hemoglobin: 14.1 g/dL (ref 13.0–17.0)
MCHC: 33.1 g/dL (ref 30.0–36.0)
MCV: 93.1 fl (ref 78.0–100.0)
Platelets: 244 10*3/uL (ref 150.0–400.0)
RBC: 4.59 Mil/uL (ref 4.22–5.81)
RDW: 14.9 % (ref 11.5–15.5)
WBC: 8.7 10*3/uL (ref 4.0–10.5)

## 2023-06-07 LAB — COMPREHENSIVE METABOLIC PANEL
ALT: 19 U/L (ref 0–53)
AST: 26 U/L (ref 0–37)
Albumin: 3.8 g/dL (ref 3.5–5.2)
Alkaline Phosphatase: 56 U/L (ref 39–117)
BUN: 48 mg/dL — ABNORMAL HIGH (ref 6–23)
CO2: 29 mEq/L (ref 19–32)
Calcium: 9.2 mg/dL (ref 8.4–10.5)
Chloride: 91 mEq/L — ABNORMAL LOW (ref 96–112)
Creatinine, Ser: 1.78 mg/dL — ABNORMAL HIGH (ref 0.40–1.50)
GFR: 32.61 mL/min — ABNORMAL LOW (ref >60.00)
Glucose, Bld: 90 mg/dL (ref 70–99)
Potassium: 4.9 mEq/L (ref 3.5–5.1)
Sodium: 130 mEq/L — ABNORMAL LOW (ref 135–145)
Total Bilirubin: 1.2 mg/dL (ref 0.2–1.2)
Total Protein: 6.8 g/dL (ref 6.0–8.3)

## 2023-06-07 LAB — VITAMIN B12: Vitamin B-12: 484 pg/mL (ref 211–911)

## 2023-06-07 LAB — T4, FREE: Free T4: 1.35 ng/dL (ref 0.60–1.60)

## 2023-06-07 NOTE — Telephone Encounter (Signed)
Please advise 

## 2023-06-07 NOTE — Telephone Encounter (Signed)
[facility]) called to report the following:  Pt weight is currently out of CHS range (125-135) at 139lbs.  after pt goes to bed, pt has pain in his hands that lasts the night. Loraine Leriche wanted to look into possible medication like Cymbalta to help with this issue.

## 2023-06-08 DIAGNOSIS — Z7984 Long term (current) use of oral hypoglycemic drugs: Secondary | ICD-10-CM | POA: Diagnosis not present

## 2023-06-08 DIAGNOSIS — N1831 Chronic kidney disease, stage 3a: Secondary | ICD-10-CM | POA: Diagnosis not present

## 2023-06-08 DIAGNOSIS — I4819 Other persistent atrial fibrillation: Secondary | ICD-10-CM | POA: Diagnosis not present

## 2023-06-08 DIAGNOSIS — I5033 Acute on chronic diastolic (congestive) heart failure: Secondary | ICD-10-CM | POA: Diagnosis not present

## 2023-06-08 DIAGNOSIS — I130 Hypertensive heart and chronic kidney disease with heart failure and stage 1 through stage 4 chronic kidney disease, or unspecified chronic kidney disease: Secondary | ICD-10-CM | POA: Diagnosis not present

## 2023-06-08 DIAGNOSIS — L03115 Cellulitis of right lower limb: Secondary | ICD-10-CM | POA: Diagnosis not present

## 2023-06-08 NOTE — Telephone Encounter (Signed)
Pt has appt on Tuesday.

## 2023-06-11 ENCOUNTER — Encounter: Payer: Self-pay | Admitting: Family Medicine

## 2023-06-11 ENCOUNTER — Other Ambulatory Visit: Payer: Self-pay | Admitting: Family Medicine

## 2023-06-11 ENCOUNTER — Ambulatory Visit (INDEPENDENT_AMBULATORY_CARE_PROVIDER_SITE_OTHER): Payer: Medicare Other | Admitting: Family Medicine

## 2023-06-11 VITALS — BP 110/80 | HR 111 | Temp 98.6°F | Resp 18 | Ht 66.0 in | Wt 139.8 lb

## 2023-06-11 DIAGNOSIS — M79642 Pain in left hand: Secondary | ICD-10-CM

## 2023-06-11 DIAGNOSIS — I5032 Chronic diastolic (congestive) heart failure: Secondary | ICD-10-CM | POA: Diagnosis not present

## 2023-06-11 DIAGNOSIS — R200 Anesthesia of skin: Secondary | ICD-10-CM | POA: Diagnosis not present

## 2023-06-11 DIAGNOSIS — G4700 Insomnia, unspecified: Secondary | ICD-10-CM | POA: Diagnosis not present

## 2023-06-11 DIAGNOSIS — L03115 Cellulitis of right lower limb: Secondary | ICD-10-CM | POA: Diagnosis not present

## 2023-06-11 DIAGNOSIS — L0591 Pilonidal cyst without abscess: Secondary | ICD-10-CM

## 2023-06-11 DIAGNOSIS — E871 Hypo-osmolality and hyponatremia: Secondary | ICD-10-CM

## 2023-06-11 DIAGNOSIS — T781XXA Other adverse food reactions, not elsewhere classified, initial encounter: Secondary | ICD-10-CM | POA: Diagnosis not present

## 2023-06-11 DIAGNOSIS — F419 Anxiety disorder, unspecified: Secondary | ICD-10-CM | POA: Diagnosis not present

## 2023-06-11 DIAGNOSIS — M79641 Pain in right hand: Secondary | ICD-10-CM | POA: Diagnosis not present

## 2023-06-11 DIAGNOSIS — I480 Paroxysmal atrial fibrillation: Secondary | ICD-10-CM

## 2023-06-11 MED ORDER — MIRTAZAPINE 7.5 MG PO TABS
7.5000 mg | ORAL_TABLET | Freq: Every day | ORAL | 2 refills | Status: DC
Start: 2023-06-11 — End: 2023-06-27

## 2023-06-11 MED ORDER — SPIRONOLACTONE 25 MG PO TABS
12.5000 mg | ORAL_TABLET | Freq: Every day | ORAL | 2 refills | Status: DC
Start: 2023-06-11 — End: 2023-07-09

## 2023-06-11 MED ORDER — CLORAZEPATE DIPOTASSIUM 7.5 MG PO TABS
7.5000 mg | ORAL_TABLET | Freq: Two times a day (BID) | ORAL | 3 refills | Status: DC | PRN
Start: 2023-06-11 — End: 2023-06-27

## 2023-06-11 MED ORDER — EMPAGLIFLOZIN 10 MG PO TABS: 10.0000 mg | ORAL_TABLET | Freq: Every day | ORAL | 0 refills | Status: DC

## 2023-06-11 NOTE — Assessment & Plan Note (Signed)
Resolved

## 2023-06-11 NOTE — Assessment & Plan Note (Signed)
Wrist splint R hand Pt has app with Dr Amanda Pea but it is not until August  They will call to see if he can get in sooner  They were also told about the urgent care hours

## 2023-06-11 NOTE — Patient Instructions (Signed)

## 2023-06-11 NOTE — Progress Notes (Signed)
Established Patient Office Visit  Subjective   Patient ID: Mark Stark., male    DOB: 1928/08/23  Age: 89 y.o. MRN: [id number]  Chief Complaint  Patient presents with   Cellulitis   Follow-up    HPI Discussed the use of AI scribe software for clinical note transcription with the patient, who gave verbal consent to proceed.  Discussed the use of AI scribe software for clinical note transcription with the patient, who gave verbal consent to proceed.  History of Present Illness   The patient, with a history of congestive heart failure, presents with multiple complaints. The patient reports skin scaling on the legs, which they attribute to swelling. The patient also suspects they have an alpha-gal allergy, which they believe is causing a mystery illness. The patient has been experiencing nocturnal hand pain, which they describe as intense and primarily affecting the right hand. The pain is mostly in the thumb and first two fingers, and it disrupts the patient's sleep. The patient also reports a tender pilonidal cyst, which they had previously had incised in 1952. The patient's caretakers express concern about the patient's safety and suggest temporary skilled nursing and assisted living.      The patient, with a history of congestive heart failure, presents with multiple complaints. The patient reports skin scaling on the legs, which they attribute to swelling. The patient also suspects they have an alpha-gal allergy, which they believe is causing a mystery illness. The patient has been experiencing nocturnal hand pain, which they describe as intense and primarily affecting the right hand. The pain is mostly in the thumb and first two fingers, and it disrupts the patient's sleep. The patient also reports a tender pilonidal cyst, which they had previously had incised in 1952. The patient's caretakers express concern about the patient's safety and suggest temporary skilled nursing and assisted  living.      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   Diverticulitis 08/09/2022   Chronic kidney disease, stage 3a (HCC) 08/09/2022   Primary open angle glaucoma (POAG) of left eye, moderate stage 06/08/2022   Contusion of left orbit 01/05/2022   Chronic diastolic CHF (congestive heart failure) (HCC) 11/09/2021   Nonrheumatic mitral valve regurgitation 10/28/2021   Acute kidney injury superimposed on CKD (HCC) 10/28/2021   Acute diastolic CHF (congestive heart failure) (HCC) 10/25/2021   Secondary hypercoagulable state (HCC) 08/23/2021   Prostate CA (HCC) 07/28/2021   Thrombocytopenia (HCC) 03/30/2021   Screening for HIV (human immunodeficiency virus) 03/30/2021   Monocytosis 03/30/2021   Abnormal platelets (HCC) 03/28/2021   Hip pain 03/28/2021   Dry eyes, bilateral 05/27/2020   Epiretinal membrane (ERM) of both eyes 12/03/2019   Persistent atrial fibrillation (HCC)    Paroxysmal atrial fibrillation (HCC) 01/12/2018   Cystoid macular edema of right eye 11/29/2017   Macular degeneration 12/21/2015   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 eye with active  choroidal neovascularization (HCC) 04/30/2014   Myalgia and myositis 11/16/2013   Epiretinal membrane 11/07/2012   Posterior vitreous detachment 11/07/2012   Status post intraocular lens implant 11/07/2012   Fungal ear  infection 04/09/2012   Abnormal laboratory test 03/27/2011   Aortic insufficiency 03/27/2011   SYSTOLIC MURMUR 03/05/2011   EDEMA 12/05/2010   ECCHYMOSES, SPONTANEOUS 11/28/2010   CONTUSION, RIGHT THIGH 11/28/2010   DECREASED HEARING, BILATERAL 09/23/2010   Pain in limb 07/28/2009   ANKLE SPRAIN, LEFT 07/28/2009   CONTUSION, LOWER LEG, LEFT 07/28/2009   DERMATITIS, CONTACT, NOS 10/02/2007   Essential hypertension 05/07/2007   PROSTATE CANCER, HX OF 03/13/2007   Hyperlipidemia 01/15/2007   Anxiety 01/15/2007   H/O oral aphthous ulcers 01/15/2007   Past Medical History:  Diagnosis Date   Acute diastolic CHF (congestive heart failure) (HCC) 10/25/2021   Anxiety    Aortic insufficiency    Atrial fibrillation (HCC)    Chronic diastolic CHF (congestive heart failure) (HCC)    Chronic kidney disease, stage 3a (HCC)    Diverticulitis    Dyslipidemia    Hypertension    Inferior mesenteric vein thrombosis (HCC)    Mitral regurgitation    Nonrheumatic mitral valve regurgitation 10/28/2021   Paroxysmal atrial fibrillation (HCC) 01/12/2018   Persistent atrial fibrillation (HCC)    Prostate cancer [facility])    Past Surgical History:  Procedure Laterality Date   CARDIOVERSION N/A 03/01/2018   Procedure: CARDIOVERSION;  Surgeon: Lennette Bihari, MD;  Location: [facility] ENDOSCOPY;  Service: Cardiovascular;  Laterality: N/A;   CARDIOVERSION N/A 08/26/2021   Procedure: CARDIOVERSION;  Surgeon: Chrystie Nose, MD;  Location: [facility] ENDOSCOPY;  Service: Cardiovascular;  Laterality: N/A;   CARDIOVERSION N/A 12/06/2021   Procedure: CARDIOVERSION;  Surgeon: Little Ishikawa, MD;  Location: [facility] ENDOSCOPY;  Service: Cardiovascular;  Laterality: N/A;   CATARACT EXTRACTION  01/27/2010   right   CATARACT EXTRACTION  03/04/2010   left   Prostate seed implants     PROSTATE SURGERY     Social History   Tobacco Use   Smoking status: Never   Smokeless tobacco: Never  Vaping Use   Vaping Use: Never used   Substance Use Topics   Alcohol use: No    Comment: Little wine occasionally    Drug use: No   Social History   Socioeconomic History   Marital status: Widowed    Spouse name: Not on file   Number of children: Not on file   Years of education: Not on file   Highest education level: Not on file  Occupational History   Occupation: Professor---RETIRED    Employer: RETIRED    Comment: Retired  Tobacco Use   Smoking status: Never   Smokeless tobacco: Never  Vaping Use   Vaping Use: Never used  Substance and Sexual Activity   Alcohol use: No    Comment: Little wine occasionally    Drug use: No   Sexual activity: Never    Partners: Female  Other Topics Concern   Not on file  Social History Narrative   Not on file   Social Determinants of Health   Financial Resource Strain: Low Risk  (02/17/2022)   Overall Financial Resource Strain (CARDIA)    Difficulty of Paying Living Expenses: Not hard at all  Food Insecurity: No Food Insecurity (06/01/2023)   Hunger Vital Sign    Worried About Running Out of Food in the Last Year: Never true    Ran Out of Food in the Last  Year: Never true  Transportation Needs: No Transportation Needs (06/01/2023)   PRAPARE - Administrator, Civil Service (Medical): No    Lack of Transportation (Non-Medical): No  Physical Activity: Insufficiently Active (05/15/2023)   Exercise Vital Sign    Days of Exercise per Week: 2 days    Minutes of Exercise per Session: 10 min  Stress: Stress Concern Present (05/15/2023)   [employer] of Occupational Health - Occupational Stress Questionnaire    Feeling of Stress : To some extent  Social Connections: Moderately Integrated (09/26/2022)   Social Connection and Isolation Panel [NHANES]    Frequency of Communication with Friends and Family: Twice a week    Frequency of Social Gatherings with Friends and Family: Twice a week    Attends Religious Services: More than 4 times per year    Active Member of  [employer] or Organizations: Yes    Attends Banker Meetings: Never    Marital Status: Widowed  Recent Concern: Social Connections - Moderately Isolated (08/29/2022)   Social Connection and Isolation Panel [NHANES]    Frequency of Communication with Friends and Family: Three times a week    Frequency of Social Gatherings with Friends and Family: Twice a week    Attends Religious Services: More than 4 times per year    Active Member of [employer] or Organizations: No    Attends Banker Meetings: Never    Marital Status: Widowed  Intimate Partner Violence: Not At Risk (05/26/2023)   Humiliation, Afraid, Rape, and Kick questionnaire    Fear of Current or Ex-Partner: No    Emotionally Abused: No    Physically Abused: No    Sexually Abused: No   Family Status  Relation Name Status   Mother  Deceased   Father  Deceased   Brother  Deceased   MGM  Deceased   MGF  Deceased   PGM  Deceased   PGF  Deceased   Family History  Problem Relation Age of Onset   Dementia Mother    Alzheimer's disease Mother    Mental illness Mother        ALZ DISEASE   Cancer Brother 38       pancreatic cancer   Allergies  Allergen Reactions   Alpha-Gal Other (See Comments)    Avoidance of meat, dairy products, and other products that contain alpha-gal. Individuals diagnosed with AGS should completely avoid the consumption of mammalian meat, including beef, pork, lamb, venison, goat, and rabbit.    Sulfonamide Derivatives Other (See Comments)    Unknown - patient cannot remember the reaction      Review of Systems  Constitutional:  Negative for fever and malaise/fatigue.  HENT:  Negative for congestion.   Eyes:  Negative for blurred vision.  Respiratory:  Negative for cough and shortness of breath.   Cardiovascular:  Negative for chest pain, palpitations and leg swelling.  Gastrointestinal:  Negative for vomiting.  Musculoskeletal:  Negative for back pain.  Skin:  Negative for rash.   Neurological:  Positive for tingling and sensory change. Negative for loss of consciousness and headaches.  Psychiatric/Behavioral:  Positive for depression. The patient is nervous/anxious and has insomnia.       Objective:     BP 110/80 (BP Location: Left Arm, Patient Position: Sitting, Cuff Size: Normal)   Pulse (!) 111   Temp 98.6 F (37 C) (Oral)   Resp 18   Ht 5\' 6"  (1.676 m)   Wt 139  lb 12.8 oz (63.4 kg)   SpO2 98%   BMI 22.56 kg/m  BP Readings from Last 3 Encounters:  06/11/23 110/80  06/06/23 122/88  05/29/23 96/60   Wt Readings from Last 3 Encounters:  06/11/23 139 lb 12.8 oz (63.4 kg)  06/06/23 140 lb 8 oz (63.7 kg)  05/29/23 136 lb 11 oz (62 kg)   SpO2 Readings from Last 3 Encounters:  06/11/23 98%  06/06/23 94%  05/29/23 99%      Physical Exam Vitals and nursing note reviewed.  Constitutional:      Appearance: He is well-developed.  HENT:     Head: Normocephalic and atraumatic.  Eyes:     Pupils: Pupils are equal, round, and reactive to light.  Neck:     Thyroid: No thyromegaly.  Cardiovascular:     Rate and Rhythm: Normal rate and regular rhythm.     Heart sounds: No murmur heard. Pulmonary:     Effort: Pulmonary effort is normal. No respiratory distress.     Breath sounds: Normal breath sounds. No wheezing or rales.  Chest:     Chest wall: No tenderness.  Musculoskeletal:     Cervical back: Normal range of motion and neck supple.     Right hip: Tenderness present. Normal range of motion. Normal strength.     Left hip: Tenderness present. Normal range of motion. Normal strength.     Right foot: Bony tenderness present. No swelling.     Left foot: Bony tenderness present. No swelling.  Skin:    General: Skin is warm and dry.          Comments: Pilonidal cyst -- tender to touch Not draining   Neurological:     Mental Status: He is alert and oriented to person, place, and time.  Psychiatric:        Behavior: Behavior normal.         Thought Content: Thought content normal.        Judgment: Judgment normal.     No results found for any visits on 06/11/23.  Last CBC Lab Results  Component Value Date   WBC 8.7 06/06/2023   HGB 14.1 06/06/2023   HCT 42.7 06/06/2023   MCV 93.1 06/06/2023   MCH 30.7 05/28/2023   RDW 14.9 06/06/2023   PLT 244.0 06/06/2023   Last metabolic panel Lab Results  Component Value Date   GLUCOSE 90 06/06/2023   NA 130 (L) 06/06/2023   K 4.9 06/06/2023   CL 91 (L) 06/06/2023   CO2 29 06/06/2023   BUN 48 (H) 06/06/2023   CREATININE 1.78 (H) 06/06/2023   GFRNONAA 55 (L) 05/29/2023   CALCIUM 9.2 06/06/2023   PHOS 4.1 08/23/2022   PROT 6.8 06/06/2023   ALBUMIN 3.8 06/06/2023   BILITOT 1.2 06/06/2023   ALKPHOS 56 06/06/2023   AST 26 06/06/2023   ALT 19 06/06/2023   ANIONGAP 9 05/29/2023   Last lipids Lab Results  Component Value Date   CHOL 123 02/09/2023   HDL 48 02/09/2023   LDLCALC 57 02/09/2023   TRIG 93 02/09/2023   CHOLHDL 2.6 02/09/2023   Last hemoglobin A1c Lab Results  Component Value Date   HGBA1C 5.0 05/21/2012   Last thyroid functions Lab Results  Component Value Date   TSH 1.498 05/25/2023   Last vitamin D No results found for: "25OHVITD2", "25OHVITD3", "VD25OH" Last vitamin B12 and Folate Lab Results  Component Value Date   VITAMINB12 484 06/06/2023  The ASCVD Risk score (Arnett DK, et al., 2019) failed to calculate for the following reasons:   The 2019 ASCVD risk score is only valid for ages 69 to 49    Assessment & Plan:   Problem List Items Addressed This Visit       Unprioritized   Chronic diastolic CHF (congestive heart failure) (HCC)   Relevant Medications   spironolactone (ALDACTONE) 25 MG tablet   empagliflozin (JARDIANCE) 10 MG TABS tablet   Other Relevant Orders   Comprehensive metabolic panel   Pilonidal cyst   Relevant Orders   Ambulatory referral to General Surgery   Numbness in both hands    Wrist splint R hand Pt  has app with Dr Amanda Pea but it is not until August  They will call to see if he can get in sooner  They were also told about the urgent care hours       Insomnia   Hyponatremia    Will need to come back for labs       Cellulitis of right lower extremity    Resolved        Bilateral hand pain - Primary   Anxiety   Relevant Medications   clorazepate (TRANXENE) 7.5 MG tablet   mirtazapine (REMERON) 7.5 MG tablet   Allergic reaction to alpha-gal    No follow-ups on file.    Donato Schultz, DO ........................ 3                                                                                                                                                        +

## 2023-06-11 NOTE — Assessment & Plan Note (Signed)
Will need to come back for labs

## 2023-06-12 ENCOUNTER — Encounter: Payer: Self-pay | Admitting: Family Medicine

## 2023-06-12 ENCOUNTER — Other Ambulatory Visit: Payer: Self-pay

## 2023-06-12 ENCOUNTER — Telehealth: Payer: Self-pay | Admitting: Family Medicine

## 2023-06-12 DIAGNOSIS — I5033 Acute on chronic diastolic (congestive) heart failure: Secondary | ICD-10-CM | POA: Diagnosis not present

## 2023-06-12 DIAGNOSIS — E785 Hyperlipidemia, unspecified: Secondary | ICD-10-CM

## 2023-06-12 DIAGNOSIS — I4819 Other persistent atrial fibrillation: Secondary | ICD-10-CM

## 2023-06-12 DIAGNOSIS — L03115 Cellulitis of right lower limb: Secondary | ICD-10-CM | POA: Diagnosis not present

## 2023-06-12 DIAGNOSIS — Z79891 Long term (current) use of opiate analgesic: Secondary | ICD-10-CM

## 2023-06-12 DIAGNOSIS — N1831 Chronic kidney disease, stage 3a: Secondary | ICD-10-CM | POA: Diagnosis not present

## 2023-06-12 DIAGNOSIS — I340 Nonrheumatic mitral (valve) insufficiency: Secondary | ICD-10-CM

## 2023-06-12 DIAGNOSIS — Z7984 Long term (current) use of oral hypoglycemic drugs: Secondary | ICD-10-CM

## 2023-06-12 DIAGNOSIS — F419 Anxiety disorder, unspecified: Secondary | ICD-10-CM

## 2023-06-12 DIAGNOSIS — I130 Hypertensive heart and chronic kidney disease with heart failure and stage 1 through stage 4 chronic kidney disease, or unspecified chronic kidney disease: Secondary | ICD-10-CM | POA: Diagnosis not present

## 2023-06-12 DIAGNOSIS — Z7901 Long term (current) use of anticoagulants: Secondary | ICD-10-CM

## 2023-06-12 NOTE — Telephone Encounter (Signed)
[employer] (power of attorney) called and patient was questioning about the medication mirtazapine 7.5mg  . Patient thought was suppose to be for sleep but looking at papers says for anxiety so he just wants to clarify. Please call [phone] to advise of medication.

## 2023-06-12 NOTE — Telephone Encounter (Signed)
I spoke with Mark Stark and advised medication is for both and the confirmed with [place] that the Clorazepate is once a day

## 2023-06-12 NOTE — Telephone Encounter (Signed)
error 

## 2023-06-12 NOTE — Telephone Encounter (Signed)
Using this medication for both anxiety and sleep?

## 2023-06-13 ENCOUNTER — Telehealth: Payer: Self-pay | Admitting: Family Medicine

## 2023-06-13 DIAGNOSIS — L03115 Cellulitis of right lower limb: Secondary | ICD-10-CM | POA: Diagnosis not present

## 2023-06-13 DIAGNOSIS — I4819 Other persistent atrial fibrillation: Secondary | ICD-10-CM | POA: Diagnosis not present

## 2023-06-13 DIAGNOSIS — I130 Hypertensive heart and chronic kidney disease with heart failure and stage 1 through stage 4 chronic kidney disease, or unspecified chronic kidney disease: Secondary | ICD-10-CM | POA: Diagnosis not present

## 2023-06-13 DIAGNOSIS — I5033 Acute on chronic diastolic (congestive) heart failure: Secondary | ICD-10-CM | POA: Diagnosis not present

## 2023-06-13 DIAGNOSIS — Z7984 Long term (current) use of oral hypoglycemic drugs: Secondary | ICD-10-CM | POA: Diagnosis not present

## 2023-06-13 DIAGNOSIS — N1831 Chronic kidney disease, stage 3a: Secondary | ICD-10-CM | POA: Diagnosis not present

## 2023-06-13 NOTE — Telephone Encounter (Signed)
Home care dropped off paperwork to be filled out by provider  ASAP ([facility] FL2-6 pages) Document ok to be faxed when ready to [phone]. Document put at front office tray under providers name. Home Health rep stated pt is really needing document ASAP

## 2023-06-14 DIAGNOSIS — H43813 Vitreous degeneration, bilateral: Secondary | ICD-10-CM | POA: Diagnosis not present

## 2023-06-14 DIAGNOSIS — H353231 Exudative age-related macular degeneration, bilateral, with active choroidal neovascularization: Secondary | ICD-10-CM | POA: Diagnosis not present

## 2023-06-14 DIAGNOSIS — H401122 Primary open-angle glaucoma, left eye, moderate stage: Secondary | ICD-10-CM | POA: Diagnosis not present

## 2023-06-14 DIAGNOSIS — Z7901 Long term (current) use of anticoagulants: Secondary | ICD-10-CM | POA: Diagnosis not present

## 2023-06-14 DIAGNOSIS — H35373 Puckering of macula, bilateral: Secondary | ICD-10-CM | POA: Diagnosis not present

## 2023-06-15 DIAGNOSIS — I130 Hypertensive heart and chronic kidney disease with heart failure and stage 1 through stage 4 chronic kidney disease, or unspecified chronic kidney disease: Secondary | ICD-10-CM | POA: Diagnosis not present

## 2023-06-15 DIAGNOSIS — Z7984 Long term (current) use of oral hypoglycemic drugs: Secondary | ICD-10-CM | POA: Diagnosis not present

## 2023-06-15 DIAGNOSIS — I4819 Other persistent atrial fibrillation: Secondary | ICD-10-CM | POA: Diagnosis not present

## 2023-06-15 DIAGNOSIS — I5033 Acute on chronic diastolic (congestive) heart failure: Secondary | ICD-10-CM | POA: Diagnosis not present

## 2023-06-15 DIAGNOSIS — N1831 Chronic kidney disease, stage 3a: Secondary | ICD-10-CM | POA: Diagnosis not present

## 2023-06-15 DIAGNOSIS — L03115 Cellulitis of right lower limb: Secondary | ICD-10-CM | POA: Diagnosis not present

## 2023-06-15 NOTE — Telephone Encounter (Signed)
Received. Placed in folder 

## 2023-06-18 ENCOUNTER — Telehealth: Payer: Self-pay

## 2023-06-18 DIAGNOSIS — G5603 Carpal tunnel syndrome, bilateral upper limbs: Secondary | ICD-10-CM | POA: Diagnosis not present

## 2023-06-18 NOTE — Patient Outreach (Signed)
  Care Coordination   06/18/2023 Name: Mark Stark. MRN: [id number] DOB: 24-Apr-1928   Care Coordination Outreach Attempts:  An unsuccessful telephone outreach was attempted for a scheduled appointment today.  Follow Up Plan:  Additional outreach attempts will be made to offer the patient care coordination information and services.   Encounter Outcome:  No Answer   Care Coordination Interventions:  No, not indicated    Kathyrn Sheriff, RN, MSN, BSN, CCM [facility] Care Coordinator [phone]

## 2023-06-19 ENCOUNTER — Telehealth: Payer: Self-pay | Admitting: Family Medicine

## 2023-06-19 DIAGNOSIS — N1831 Chronic kidney disease, stage 3a: Secondary | ICD-10-CM | POA: Diagnosis not present

## 2023-06-19 DIAGNOSIS — I130 Hypertensive heart and chronic kidney disease with heart failure and stage 1 through stage 4 chronic kidney disease, or unspecified chronic kidney disease: Secondary | ICD-10-CM | POA: Diagnosis not present

## 2023-06-19 DIAGNOSIS — L03115 Cellulitis of right lower limb: Secondary | ICD-10-CM | POA: Diagnosis not present

## 2023-06-19 DIAGNOSIS — I5033 Acute on chronic diastolic (congestive) heart failure: Secondary | ICD-10-CM | POA: Diagnosis not present

## 2023-06-19 DIAGNOSIS — Z7984 Long term (current) use of oral hypoglycemic drugs: Secondary | ICD-10-CM | POA: Diagnosis not present

## 2023-06-19 DIAGNOSIS — I4819 Other persistent atrial fibrillation: Secondary | ICD-10-CM | POA: Diagnosis not present

## 2023-06-19 NOTE — Telephone Encounter (Signed)
[facility]) called to advised PCP that weight was 137.2 lbs.

## 2023-06-20 ENCOUNTER — Telehealth: Payer: Self-pay | Admitting: Family Medicine

## 2023-06-20 NOTE — Telephone Encounter (Signed)
Pt wants to know if he should refill his spironolactone (ALDACTONE) 25 MG tablet  or should he let it run out. Please call him to advise.

## 2023-06-20 NOTE — Telephone Encounter (Signed)
Amber with [facility] called to follow up on FL2 forms. Advised forms are in provider's box. Please call Amber back at [phone] to provide update.

## 2023-06-20 NOTE — Telephone Encounter (Signed)
Please advise 

## 2023-06-21 NOTE — Telephone Encounter (Signed)
Pt called. LDVM 

## 2023-06-22 NOTE — Telephone Encounter (Signed)
Bill (HPOA) called stating that the [facility] forms that were filled out were old forms and the new ones need to be filed out. He stated the facility he is being admitted to should be sending them over.

## 2023-06-22 NOTE — Telephone Encounter (Signed)
New form, med list, and vaccine faxed

## 2023-06-25 ENCOUNTER — Ambulatory Visit (HOSPITAL_BASED_OUTPATIENT_CLINIC_OR_DEPARTMENT_OTHER): Payer: Medicare Other | Admitting: Family

## 2023-06-25 NOTE — Telephone Encounter (Signed)
Please see message from Friday

## 2023-06-25 NOTE — Telephone Encounter (Signed)
Mark Stark (she says she is HPOA) called to follow up on [facility] paperwork for [facility]. Please call to advise the status of paperwork. [phone]

## 2023-06-26 DIAGNOSIS — L03115 Cellulitis of right lower limb: Secondary | ICD-10-CM | POA: Diagnosis not present

## 2023-06-26 DIAGNOSIS — Z7984 Long term (current) use of oral hypoglycemic drugs: Secondary | ICD-10-CM | POA: Diagnosis not present

## 2023-06-26 DIAGNOSIS — I4819 Other persistent atrial fibrillation: Secondary | ICD-10-CM | POA: Diagnosis not present

## 2023-06-26 DIAGNOSIS — N1831 Chronic kidney disease, stage 3a: Secondary | ICD-10-CM | POA: Diagnosis not present

## 2023-06-26 DIAGNOSIS — I5033 Acute on chronic diastolic (congestive) heart failure: Secondary | ICD-10-CM | POA: Diagnosis not present

## 2023-06-26 DIAGNOSIS — I130 Hypertensive heart and chronic kidney disease with heart failure and stage 1 through stage 4 chronic kidney disease, or unspecified chronic kidney disease: Secondary | ICD-10-CM | POA: Diagnosis not present

## 2023-06-27 ENCOUNTER — Encounter: Payer: Self-pay | Admitting: Adult Health

## 2023-06-27 ENCOUNTER — Non-Acute Institutional Stay (SKILLED_NURSING_FACILITY): Payer: Medicare Other | Admitting: Adult Health

## 2023-06-27 DIAGNOSIS — E785 Hyperlipidemia, unspecified: Secondary | ICD-10-CM | POA: Diagnosis not present

## 2023-06-27 DIAGNOSIS — I480 Paroxysmal atrial fibrillation: Secondary | ICD-10-CM | POA: Diagnosis not present

## 2023-06-27 DIAGNOSIS — J309 Allergic rhinitis, unspecified: Secondary | ICD-10-CM | POA: Diagnosis not present

## 2023-06-27 DIAGNOSIS — F419 Anxiety disorder, unspecified: Secondary | ICD-10-CM | POA: Diagnosis not present

## 2023-06-27 DIAGNOSIS — I5032 Chronic diastolic (congestive) heart failure: Secondary | ICD-10-CM

## 2023-06-27 DIAGNOSIS — I4819 Other persistent atrial fibrillation: Secondary | ICD-10-CM | POA: Diagnosis not present

## 2023-06-27 DIAGNOSIS — T781XXD Other adverse food reactions, not elsewhere classified, subsequent encounter: Secondary | ICD-10-CM | POA: Diagnosis not present

## 2023-06-27 DIAGNOSIS — D696 Thrombocytopenia, unspecified: Secondary | ICD-10-CM | POA: Diagnosis not present

## 2023-06-27 DIAGNOSIS — G609 Hereditary and idiopathic neuropathy, unspecified: Secondary | ICD-10-CM | POA: Diagnosis not present

## 2023-06-27 DIAGNOSIS — G629 Polyneuropathy, unspecified: Secondary | ICD-10-CM

## 2023-06-27 DIAGNOSIS — E871 Hypo-osmolality and hyponatremia: Secondary | ICD-10-CM | POA: Diagnosis not present

## 2023-06-27 DIAGNOSIS — L03115 Cellulitis of right lower limb: Secondary | ICD-10-CM | POA: Diagnosis not present

## 2023-06-27 DIAGNOSIS — L89890 Pressure ulcer of other site, unstageable: Secondary | ICD-10-CM | POA: Diagnosis not present

## 2023-06-27 DIAGNOSIS — N400 Enlarged prostate without lower urinary tract symptoms: Secondary | ICD-10-CM | POA: Diagnosis not present

## 2023-06-27 DIAGNOSIS — Z8546 Personal history of malignant neoplasm of prostate: Secondary | ICD-10-CM | POA: Diagnosis not present

## 2023-06-27 DIAGNOSIS — I5033 Acute on chronic diastolic (congestive) heart failure: Secondary | ICD-10-CM | POA: Diagnosis not present

## 2023-06-27 DIAGNOSIS — Z91018 Allergy to other foods: Secondary | ICD-10-CM | POA: Diagnosis not present

## 2023-06-27 DIAGNOSIS — L0591 Pilonidal cyst without abscess: Secondary | ICD-10-CM | POA: Diagnosis not present

## 2023-06-27 DIAGNOSIS — G4700 Insomnia, unspecified: Secondary | ICD-10-CM | POA: Diagnosis not present

## 2023-06-27 DIAGNOSIS — H401122 Primary open-angle glaucoma, left eye, moderate stage: Secondary | ICD-10-CM | POA: Diagnosis not present

## 2023-06-27 DIAGNOSIS — N1831 Chronic kidney disease, stage 3a: Secondary | ICD-10-CM | POA: Diagnosis not present

## 2023-06-27 DIAGNOSIS — I10 Essential (primary) hypertension: Secondary | ICD-10-CM | POA: Diagnosis not present

## 2023-06-27 DIAGNOSIS — F5101 Primary insomnia: Secondary | ICD-10-CM | POA: Diagnosis not present

## 2023-06-27 MED ORDER — CLORAZEPATE DIPOTASSIUM 7.5 MG PO TABS
7.5000 mg | ORAL_TABLET | Freq: Two times a day (BID) | ORAL | 3 refills | Status: DC | PRN
Start: 2023-06-27 — End: 2023-07-09

## 2023-06-27 MED ORDER — GABAPENTIN 100 MG PO CAPS
100.0000 mg | ORAL_CAPSULE | Freq: Every day | ORAL | 3 refills | Status: DC
Start: 2023-06-27 — End: 2023-07-09

## 2023-06-27 NOTE — Progress Notes (Addendum)
Location:  [facility] Room Number: 013A Place of Service:  SNF (31) Provider:  Kenard Gower, DNP, FNP-BC  Patient Care Team: Donato Schultz, DO as PCP - General Jodelle Red, MD as PCP - Cardiology (Cardiology) Marvis Repress, MD as Referring Physician (Ophthalmology) Cherlyn Roberts, MD as Consulting Physician (Dermatology) Belva Agee, MD as Consulting Physician (Urology) Belva Agee, MD as Consulting Physician (Urology) Bond, Doran Stabler, MD as Referring Physician (Ophthalmology)  Extended Emergency Contact Information Primary Emergency Contact: Ernest Pine, [place] [address] [place] of [place] Home Phone: [phone] Mobile Phone: [phone] Relation: Friend  Code Status:  Full Code  Goals of care: Advanced Directive information    06/27/2023   12:04 PM  Advanced Directives  Does Patient Have a Medical Advance Directive? Yes  Type of Estate agent of [place];Living will  Does patient want to make changes to medical advance directive? No - Patient declined  Copy of Healthcare Power of Attorney in Chart? Yes - validated most recent copy scanned in chart (See row information)  Would patient like information on creating a medical advance directive? No - Patient declined     Chief Complaint  Patient presents with   New Admit To SNF    New admit    HPI:  Pt is a 89 y.o. male seen who was admitted to [facility] SNF today, 06/27/23. He was accompanied by his caretaker. He was admitted from home. He has a PMH of congestive heart failure. He has bilateral lower extremity trace edema which according to his caretaker was a lot better than before. He stated that he had a tick bite 8 weeks ago and was treated with antibiotics X 20 days and now he has alpha gal syndrome. He complains of burning sensation on his right hand at night, mostly in the thumb and first 2  fingers. He denies having poor appetite. He has of pilonidal cyst which was previously incised in 1952. His caretaker is concerned about his safety and suggested temporary SNF or ALF.   Past Medical History:  Diagnosis Date   Acute diastolic CHF (congestive heart failure) (HCC) 10/25/2021   Anxiety    Aortic insufficiency    Atrial fibrillation (HCC)    Chronic diastolic CHF (congestive heart failure) (HCC)    Chronic kidney disease, stage 3a (HCC)    Diverticulitis    Dyslipidemia    Hypertension    Inferior mesenteric vein thrombosis (HCC)    Mitral regurgitation    Nonrheumatic mitral valve regurgitation 10/28/2021   Paroxysmal atrial fibrillation (HCC) 01/12/2018   Persistent atrial fibrillation (HCC)    Prostate cancer [facility])    Past Surgical History:  Procedure Laterality Date   CARDIOVERSION N/A 03/01/2018   Procedure: CARDIOVERSION;  Surgeon: Lennette Bihari, MD;  Location: [facility] ENDOSCOPY;  Service: Cardiovascular;  Laterality: N/A;   CARDIOVERSION N/A 08/26/2021   Procedure: CARDIOVERSION;  Surgeon: Chrystie Nose, MD;  Location: [facility] ENDOSCOPY;  Service: Cardiovascular;  Laterality: N/A;   CARDIOVERSION N/A 12/06/2021   Procedure: CARDIOVERSION;  Surgeon: Little Ishikawa, MD;  Location: [facility] ENDOSCOPY;  Service: Cardiovascular;  Laterality: N/A;   CATARACT EXTRACTION  01/27/2010   right   CATARACT EXTRACTION  03/04/2010   left   Prostate seed implants     PROSTATE SURGERY      Allergies  Allergen Reactions   Alpha-Gal Other (See Comments)    Avoidance  of meat, dairy products, and other products that contain alpha-gal. Individuals diagnosed with AGS should completely avoid the consumption of mammalian meat, including beef, pork, lamb, venison, goat, and rabbit.    Sulfonamide Derivatives Other (See Comments)    Unknown - patient cannot remember the reaction    Outpatient Encounter Medications as of 06/27/2023  Medication Sig   acetaminophen (TYLENOL) 325 MG tablet  Take 2 tablets (650 mg total) by mouth every 6 (six) hours as needed for mild pain or moderate pain (or Fever >/= 101).   apixaban (ELIQUIS) 5 MG TABS tablet Take 1 tablet (5 mg total) by mouth 2 (two) times daily.   b complex vitamins tablet Take 1 tablet by mouth daily.   Calcium Carbonate-Vitamin D 600-400 MG-UNIT tablet Take 1 tablet by mouth daily.   clorazepate (TRANXENE) 7.5 MG tablet Take 1 tablet (7.5 mg total) by mouth 2 (two) times daily as needed for anxiety.   cromolyn (NASALCROM) 5.2 MG/ACT nasal spray Place 2 sprays into both nostrils at bedtime.   empagliflozin (JARDIANCE) 10 MG TABS tablet Take 1 tablet (10 mg total) by mouth daily.   feeding supplement (ENSURE ENLIVE / ENSURE PLUS) LIQD Take 237 mLs by mouth 2 (two) times daily between meals.   fenofibrate 160 MG tablet Take 160 mg by mouth daily.   fish oil-omega-3 fatty acids 1000 MG capsule Take 1 g by mouth 2 (two) times daily.   latanoprost (XALATAN) 0.005 % ophthalmic solution Place 1 drop into the left eye at bedtime.   magic mouthwash w/lidocaine SOLN Take 5 mLs by mouth 4 (four) times daily as needed for mouth pain. Swish and Spit   metoprolol succinate (TOPROL XL) 25 MG 24 hr tablet Take 0.5 tablets (12.5 mg total) by mouth daily.   mirtazapine (REMERON) 7.5 MG tablet Take 1 tablet (7.5 mg total) by mouth at bedtime.   Multiple Vitamins-Minerals (MULTIVITAMIN PO) Take 1 tablet by mouth daily with breakfast.   Multiple Vitamins-Minerals (PRESERVISION AREDS 2) CAPS 2 po qd   simvastatin (ZOCOR) 10 MG tablet TAKE 1 TABLET BY MOUTH EVERYDAY AT BEDTIME   spironolactone (ALDACTONE) 25 MG tablet Take 1/2 tablet (12.5 mg total) by mouth daily.   torsemide (DEMADEX) 20 MG tablet Take 1 tablet (20 mg total) by mouth daily. In case of weight gain 2 to 3 lbs in 24 hr or 5 lbs in 7 days take 2 tablets daily until weight back to baseline.   vitamin C (ASCORBIC ACID) 500 MG tablet Take 500 mg by mouth daily.   [DISCONTINUED]  fenofibrate 160 MG tablet TAKE 1 TABLET BY MOUTH EVERY DAY   No facility-administered encounter medications on file as of 06/27/2023.    Review of Systems  Constitutional:  Negative for activity change, appetite change and fever.  HENT:  Negative for sore throat.   Eyes: Negative.   Cardiovascular:  Negative for chest pain and leg swelling.  Gastrointestinal:  Negative for abdominal distention, diarrhea and vomiting.  Genitourinary:  Negative for dysuria, frequency and urgency.  Skin:  Negative for color change.  Neurological:  Negative for dizziness and headaches.  Psychiatric/Behavioral:  Negative for behavioral problems and sleep disturbance. The patient is not nervous/anxious.       Immunization History  Administered Date(s) Administered   Influenza Whole 11/14/2007, 09/30/2008, 09/08/2009, 08/31/2010, 09/03/2013   Influenza, High Dose Seasonal PF 08/19/2015, 09/07/2017, 08/05/2019, 09/28/2022   Influenza-Unspecified 09/07/2014, 09/15/2016, 08/03/2020   PFIZER Comirnaty(Gray Top)Covid-19 Tri-Sucrose Vaccine 04/11/2021   PFIZER(Purple Top)SARS-COV-2  Vaccination 01/18/2020, 02/09/2020, 09/14/2020   Pfizer Covid-19 Vaccine Bivalent Booster 66yrs & up 10/04/2021, 05/15/2022   Pneumococcal Conjugate-13 09/07/2014   Pneumococcal Polysaccharide-23 09/22/2009   Td 12/01/2022   Tdap 12/25/2012   Unspecified SARS-COV-2 Vaccination 09/28/2022   Zoster Recombinant(Shingrix) 02/17/2022, 05/15/2022   Zoster, Live 03/19/2008   Pertinent  Health Maintenance Due  Topic Date Due   INFLUENZA VACCINE  07/26/2023      08/25/2022    9:30 AM 11/24/2022   11:18 AM 12/01/2022    2:42 PM 02/09/2023    1:25 PM 02/19/2023    1:06 PM  Fall Risk  Falls in the past year?   1 1 1   Was there an injury with Fall?   0 1 1  Fall Risk Category Calculator   1 2 2   Fall Risk Category (Retired)   Low    (RETIRED) Patient Fall Risk Level High fall risk High fall risk High fall risk    Patient at Risk for  Falls Due to   Impaired balance/gait Impaired balance/gait;Impaired mobility History of fall(s)  Fall risk Follow up   Falls evaluation completed Falls evaluation completed Falls evaluation completed     Vitals:   06/27/23 1153 06/27/23 1429  BP: 104/75   Pulse: (!) 117 93  Resp: 19   Temp: 97.9 F (36.6 C)   SpO2: 96%   Weight: 141 lb 8 oz (64.2 kg)   Height: 5\' 6"  (1.676 m)    Body mass index is 22.84 kg/m.  Physical Exam Constitutional:      Appearance: Normal appearance.  HENT:     Head: Normocephalic and atraumatic.     Mouth/Throat:     Mouth: Mucous membranes are moist.  Eyes:     Conjunctiva/sclera: Conjunctivae normal.  Cardiovascular:     Rate and Rhythm: Normal rate and regular rhythm.     Pulses: Normal pulses.     Heart sounds: Normal heart sounds.  Pulmonary:     Effort: Pulmonary effort is normal.     Breath sounds: Normal breath sounds.  Abdominal:     General: Bowel sounds are normal.     Palpations: Abdomen is soft.  Musculoskeletal:        General: No swelling. Normal range of motion.     Cervical back: Normal range of motion.  Skin:    General: Skin is warm and dry.  Neurological:     General: No focal deficit present.     Mental Status: He is alert and oriented to person, place, and time.  Psychiatric:        Mood and Affect: Mood normal.        Behavior: Behavior normal.        Thought Content: Thought content normal.        Judgment: Judgment normal.        Labs reviewed: Recent Labs    08/23/22 0312 08/24/22 0601 08/25/22 0619 08/31/22 1120 05/27/23 0206 05/28/23 0251 05/29/23 0337 06/06/23 1526  NA 140 137 137   < > 128* 132* 132* 130*  K 4.8 3.7 3.6   < > 4.0 4.0 4.3 4.9  CL 107 101 101   < > 93* 94* 97* 91*  CO2 24 28 30    < > 25 28 26 29   GLUCOSE 115* 92 94   < > 104* 111* 108* 90  BUN 20 26* 32*   < > 34* 41* 45* 48*  CREATININE 1.06 1.24 1.12   < >  1.24 1.26* 1.23 1.78*  CALCIUM 9.1 9.1 8.5*   < > 8.4* 8.7* 8.7*  9.2  MG 1.9 2.0 2.0  --  2.1  --   --   --   PHOS 4.1  --   --   --   --   --   --   --    < > = values in this interval not displayed.   Recent Labs    05/25/23 1217 05/25/23 1853 06/06/23 1526  AST 29 37 26  ALT 23 27 19   ALKPHOS 54 55 56  BILITOT 1.3* 1.3* 1.2  PROT 6.1 6.3* 6.8  ALBUMIN 3.6 3.4* 3.8   Recent Labs    05/15/23 1420 05/25/23 1217 05/25/23 1853 05/26/23 0820 05/27/23 0206 05/28/23 0251 06/06/23 1526  WBC 9.3 9.8 11.6*   < > 8.8 11.6* 8.7  NEUTROABS 8.2* 7.2 9.3*  --   --   --   --   HGB 13.9 14.0 14.4   < > 13.6 14.2 14.1  HCT 41.6 42.8 41.9   < > 39.9 41.8 42.7  MCV 95.0 92.8 89.1   < > 90.7 90.5 93.1  PLT 175.0 297.0 278   < > 251 262 244.0   < > = values in this interval not displayed.   Lab Results  Component Value Date   TSH 1.498 05/25/2023   Lab Results  Component Value Date   HGBA1C 5.0 05/21/2012   Lab Results  Component Value Date   CHOL 123 02/09/2023   HDL 48 02/09/2023   LDLCALC 57 02/09/2023   TRIG 93 02/09/2023   CHOLHDL 2.6 02/09/2023    Significant Diagnostic Results in last 30 days:  No results found.  Assessment/Plan  1. Neuropathy -  has burning sensation on right hand -  discontinue Mirtazapine and start on Gabapentin - gabapentin (NEURONTIN) 100 MG capsule; Take 1 capsule (100 mg total) by mouth at bedtime.  Dispense: 30 capsule; Refill: 3  2. Paroxysmal atrial fibrillation (HCC) -  rate-controlled -  continue Eliquis for anticoagulation and Metoprolol succinate for rate control  3. Anxiety -  mood is stable - clorazepate (TRANXENE) 7.5 MG tablet; Take 1 tablet (7.5 mg total) by mouth 2 (two) times daily as needed for anxiety.  Dispense: 30 tablet; Refill: 3  4. Chronic diastolic CHF (congestive heart failure) (HCC) -  no SOB -  continue Jardiance, Spironolactone and Torsemide  5. Pilonidal cyst -  was referred to [facility]  6. Allergic rhinitis, unspecified seasonality, unspecified  trigger -  continue Cromolyn sodium nasal spray  7. Hyperlipidemia, unspecified hyperlipidemia type Lab Results  Component Value Date   CHOL 123 02/09/2023   HDL 48 02/09/2023   LDLCALC 57 02/09/2023   TRIG 93 02/09/2023   CHOLHDL 2.6 02/09/2023   - fenofibrate 160 MG tablet; Take 160 mg by mouth daily.    Family/ staff Communication: Discussed plan of care with resident and charge nurse  Labs/tests ordered:  BMP    Kenard Gower, DNP, MSN, FNP-BC [facility] [phone] (Monday-Friday 8:00 a.m. - 5:00 p.m.) [phone] (after hours)

## 2023-06-29 ENCOUNTER — Encounter: Payer: Self-pay | Admitting: Nurse Practitioner

## 2023-06-29 ENCOUNTER — Non-Acute Institutional Stay (SKILLED_NURSING_FACILITY): Payer: Medicare Other | Admitting: Nurse Practitioner

## 2023-06-29 DIAGNOSIS — Z91018 Allergy to other foods: Secondary | ICD-10-CM

## 2023-06-29 DIAGNOSIS — I5032 Chronic diastolic (congestive) heart failure: Secondary | ICD-10-CM | POA: Diagnosis not present

## 2023-06-29 DIAGNOSIS — N400 Enlarged prostate without lower urinary tract symptoms: Secondary | ICD-10-CM

## 2023-06-29 DIAGNOSIS — E871 Hypo-osmolality and hyponatremia: Secondary | ICD-10-CM | POA: Diagnosis not present

## 2023-06-29 DIAGNOSIS — W19XXXA Unspecified fall, initial encounter: Secondary | ICD-10-CM | POA: Insufficient documentation

## 2023-06-29 DIAGNOSIS — F5101 Primary insomnia: Secondary | ICD-10-CM | POA: Diagnosis not present

## 2023-06-29 DIAGNOSIS — G609 Hereditary and idiopathic neuropathy, unspecified: Secondary | ICD-10-CM | POA: Diagnosis not present

## 2023-06-29 DIAGNOSIS — E785 Hyperlipidemia, unspecified: Secondary | ICD-10-CM

## 2023-06-29 DIAGNOSIS — I10 Essential (primary) hypertension: Secondary | ICD-10-CM

## 2023-06-29 DIAGNOSIS — N1831 Chronic kidney disease, stage 3a: Secondary | ICD-10-CM

## 2023-06-29 DIAGNOSIS — I4819 Other persistent atrial fibrillation: Secondary | ICD-10-CM

## 2023-06-29 DIAGNOSIS — G629 Polyneuropathy, unspecified: Secondary | ICD-10-CM | POA: Insufficient documentation

## 2023-06-29 NOTE — Assessment & Plan Note (Signed)
runs low, will decrease Torsemide to 10mg /20mg  every day, weight daily, may give 20mg  if weight gains 2-3Ibs/24hr or 3-5Ibs/7days. Obsere.

## 2023-06-29 NOTE — Progress Notes (Signed)
Location:   [facility] Room Number: NO/13/A Place of Service:  SNF (31) Provider: Arna Snipe Anvita Hirata NP  Donato Schultz, DO  Patient Care Team: Zola Button, Grayling Congress, DO as PCP - General Jodelle Red, MD as PCP - Cardiology (Cardiology) Marvis Repress, MD as Referring Physician (Ophthalmology) Cherlyn Roberts, MD as Consulting Physician (Dermatology) Belva Agee, MD as Consulting Physician (Urology) Belva Agee, MD as Consulting Physician (Urology) Bond, Doran Stabler, MD as Referring Physician (Ophthalmology)  Extended Emergency Contact Information Primary Emergency Contact: Ernest Pine, [place] [address] [place] of [place] Home Phone: [phone] Mobile Phone: [phone] Relation: Friend  Code Status: DNR Goals of care: Advanced Directive information    06/27/2023   12:04 PM  Advanced Directives  Does Patient Have a Medical Advance Directive? Yes  Type of Estate agent of [place];Living will  Does patient want to make changes to medical advance directive? No - Patient declined  Copy of Healthcare Power of Attorney in Chart? Yes - validated most recent copy scanned in chart (See row information)  Would patient like information on creating a medical advance directive? No - Patient declined     Chief Complaint  Patient presents with  . Acute Visit    Patient is being seen for low blood pressure    HPI:  Pt is a 89 y.o. male seen today for an acute visit for low Bp, HR 110bpm, the patient denied dizziness, HA, change of vision, SOB, cough, chest pain/pressure, or palpitation.   Alpha-gal, no skin rash presently  Peripheral neuropathy, uses wrist brace, Gabapentin is effective, also helped with sleep at night.   HTN, runs low  CHF on Spironolactone, Torsemide, Jardiance, compensated, trace edema BLE, clear lungs  Afib, HR 110bpm on Metoprolol 12.5mg  every day, Eliquis  Pilonidal cyst, pending  surgery  HLD fenofibrate, Simvastatin  Allergic rhinitis, takes Cromolyn nasal spray.  Anxiety/insomnia, Clorazepate prn  Hyponatremia, Na 130 06/06/23  CKD Bun/creat 48/1.78 06/06/23  Urinary frequency, Hx of prostate cancer s/p seeding implant radiation tx   Past Medical History:  Diagnosis Date  . Acute diastolic CHF (congestive heart failure) (HCC) 10/25/2021  . Anxiety   . Aortic insufficiency   . Atrial fibrillation (HCC)   . Chronic diastolic CHF (congestive heart failure) (HCC)   . Chronic kidney disease, stage 3a (HCC)   . Diverticulitis   . Dyslipidemia   . Hypertension   . Inferior mesenteric vein thrombosis (HCC)   . Mitral regurgitation   . Nonrheumatic mitral valve regurgitation 10/28/2021  . Paroxysmal atrial fibrillation (HCC) 01/12/2018  . Persistent atrial fibrillation (HCC)   . Prostate cancer [facility])    Past Surgical History:  Procedure Laterality Date  . CARDIOVERSION N/A 03/01/2018   Procedure: CARDIOVERSION;  Surgeon: Lennette Bihari, MD;  Location: [facility] ENDOSCOPY;  Service: Cardiovascular;  Laterality: N/A;  . CARDIOVERSION N/A 08/26/2021   Procedure: CARDIOVERSION;  Surgeon: Chrystie Nose, MD;  Location: [facility] ENDOSCOPY;  Service: Cardiovascular;  Laterality: N/A;  . CARDIOVERSION N/A 12/06/2021   Procedure: CARDIOVERSION;  Surgeon: Little Ishikawa, MD;  Location: [facility] ENDOSCOPY;  Service: Cardiovascular;  Laterality: N/A;  . CATARACT EXTRACTION  01/27/2010   right  . CATARACT EXTRACTION  03/04/2010   left  . Prostate seed implants    . PROSTATE SURGERY      Allergies  Allergen Reactions  . Alpha-Gal Other (See Comments)    Avoidance of meat,  dairy products, and other products that contain alpha-gal. Individuals diagnosed with AGS should completely avoid the consumption of mammalian meat, including beef, pork, lamb, venison, goat, and rabbit.   . Sulfonamide Derivatives Other (See Comments)    Unknown - patient cannot remember the reaction     Allergies as of 06/29/2023       Reactions   Alpha-gal Other (See Comments)   Avoidance of meat, dairy products, and other products that contain alpha-gal. Individuals diagnosed with AGS should completely avoid the consumption of mammalian meat, including beef, pork, lamb, venison, goat, and rabbit.    Sulfonamide Derivatives Other (See Comments)   Unknown - patient cannot remember the reaction        Medication List        Accurate as of June 29, 2023  3:42 PM. If you have any questions, ask your nurse or doctor.          acetaminophen 325 MG tablet Commonly known as: TYLENOL Take 2 tablets (650 mg total) by mouth every 6 (six) hours as needed for mild pain or moderate pain (or Fever >/= 101).   apixaban 5 MG Tabs tablet Commonly known as: Eliquis Take 1 tablet (5 mg total) by mouth 2 (two) times daily.   ascorbic acid 500 MG tablet Commonly known as: VITAMIN C Take 500 mg by mouth daily.   b complex vitamins tablet Take 1 tablet by mouth daily.   Calcium Carbonate-Vitamin D 600-400 MG-UNIT tablet Take 1 tablet by mouth daily.   clorazepate 7.5 MG tablet Commonly known as: TRANXENE Take 1 tablet (7.5 mg total) by mouth 2 (two) times daily as needed for anxiety.   cromolyn 5.2 MG/ACT nasal spray Commonly known as: NASALCROM Place 2 sprays into both nostrils at bedtime.   empagliflozin 10 MG Tabs tablet Commonly known as: JARDIANCE Take 1 tablet (10 mg total) by mouth daily.   fenofibrate 160 MG tablet Take 160 mg by mouth daily.   fish oil-omega-3 fatty acids 1000 MG capsule Take 1 g by mouth 2 (two) times daily.   gabapentin 100 MG capsule Commonly known as: NEURONTIN Take 1 capsule (100 mg total) by mouth at bedtime.   latanoprost 0.005 % ophthalmic solution Commonly known as: XALATAN Place 1 drop into the left eye at bedtime.   magic mouthwash w/lidocaine Soln Take 5 mLs by mouth 4 (four) times daily as needed for mouth pain. Swish and Spit    metoprolol succinate 25 MG 24 hr tablet Commonly known as: Toprol XL Take 0.5 tablets (12.5 mg total) by mouth daily.   MULTIVITAMIN PO Take 1 tablet by mouth daily with breakfast.   PreserVision AREDS 2 Caps 2 po qd   simvastatin 10 MG tablet Commonly known as: ZOCOR TAKE 1 TABLET BY MOUTH EVERYDAY AT BEDTIME   spironolactone 25 MG tablet Commonly known as: ALDACTONE Take 1/2 tablet (12.5 mg total) by mouth daily.   torsemide 20 MG tablet Commonly known as: DEMADEX Take 1 tablet (20 mg total) by mouth daily. In case of weight gain 2 to 3 lbs in 24 hr or 5 lbs in 7 days take 2 tablets daily until weight back to baseline.        Review of Systems  Constitutional:  Negative for appetite change, fatigue and fever.  HENT:  Negative for congestion and trouble swallowing.   Eyes:  Negative for visual disturbance.  Respiratory:  Negative for cough, chest tightness, shortness of breath and wheezing.   Cardiovascular:  Positive for leg swelling. Negative for chest pain and palpitations.  Gastrointestinal:  Negative for abdominal pain, constipation, nausea and vomiting.  Genitourinary:  Positive for frequency. Negative for dysuria and urgency.  Skin:  Negative for color change.  Neurological:  Negative for weakness, light-headedness and headaches.  Psychiatric/Behavioral:  Negative for confusion and sleep disturbance. The patient is not nervous/anxious.     Immunization History  Administered Date(s) Administered  . Influenza Whole 11/14/2007, 09/30/2008, 09/08/2009, 08/31/2010, 09/03/2013  . Influenza, High Dose Seasonal PF 08/19/2015, 09/07/2017, 08/05/2019, 09/28/2022  . Influenza-Unspecified 09/07/2014, 09/15/2016, 08/03/2020  . PFIZER Comirnaty(Gray Top)Covid-19 Tri-Sucrose Vaccine 04/11/2021  . PFIZER(Purple Top)SARS-COV-2 Vaccination 01/18/2020, 02/09/2020, 09/14/2020  . [employer], [employer] 72yrs & up 10/04/2021, 05/15/2022  . Pneumococcal  Conjugate-13 09/07/2014  . Pneumococcal Polysaccharide-23 09/22/2009  . Td 12/01/2022  . Tdap 12/25/2012  . Unspecified SARS-COV-2 Vaccination 09/28/2022  . Zoster Recombinant(Shingrix) 02/17/2022, 05/15/2022  . Zoster, Live 03/19/2008   Pertinent  Health Maintenance Due  Topic Date Due  . INFLUENZA VACCINE  07/26/2023      08/25/2022    9:30 AM 11/24/2022   11:18 AM 12/01/2022    2:42 PM 02/09/2023    1:25 PM 02/19/2023    1:06 PM  Fall Risk  Falls in the past year?   1 1 1   Was there an injury with Fall?   0 1 1  Fall Risk Category Calculator   1 2 2   Fall Risk Category (Retired)   Low    (RETIRED) Patient Fall Risk Level High fall risk High fall risk High fall risk    Patient at Risk for Falls Due to   Impaired balance/gait Impaired balance/gait;Impaired mobility History of fall(s)  Fall risk Follow up   Falls evaluation completed Falls evaluation completed Falls evaluation completed   Functional Status Survey:    There were no vitals filed for this visit. There is no height or weight on file to calculate BMI. Physical Exam Vitals and nursing note reviewed.  Constitutional:      Appearance: Normal appearance.  HENT:     Head: Normocephalic and atraumatic.     Nose: Nose normal.     Mouth/Throat:     Mouth: Mucous membranes are moist.  Eyes:     Extraocular Movements: Extraocular movements intact.     Conjunctiva/sclera: Conjunctivae normal.     Pupils: Pupils are equal, round, and reactive to light.  Cardiovascular:     Rate and Rhythm: Tachycardia present. Rhythm irregular.     Heart sounds: No murmur heard. Pulmonary:     Effort: Pulmonary effort is normal.     Breath sounds: Normal breath sounds. No wheezing or rales.  Abdominal:     General: Bowel sounds are normal.     Palpations: Abdomen is soft.     Tenderness: There is no abdominal tenderness.  Musculoskeletal:     Cervical back: Normal range of motion and neck supple.     Right lower leg: Edema present.      Left lower leg: Edema present.     Comments: Trace edema BLE  Skin:    General: Skin is warm and dry.  Neurological:     General: No focal deficit present.     Mental Status: He is alert and oriented to person, place, and time. Mental status is at baseline.     Motor: No weakness.     Gait: Gait normal.  Psychiatric:        Mood and  Affect: Mood normal.        Behavior: Behavior normal.        Thought Content: Thought content normal.        Judgment: Judgment normal.    Labs reviewed: Recent Labs    08/23/22 0312 08/24/22 0601 08/25/22 0619 08/31/22 1120 05/27/23 0206 05/28/23 0251 05/29/23 0337 06/06/23 1526  NA 140 137 137   < > 128* 132* 132* 130*  K 4.8 3.7 3.6   < > 4.0 4.0 4.3 4.9  CL 107 101 101   < > 93* 94* 97* 91*  CO2 24 28 30    < > 25 28 26 29   GLUCOSE 115* 92 94   < > 104* 111* 108* 90  BUN 20 26* 32*   < > 34* 41* 45* 48*  CREATININE 1.06 1.24 1.12   < > 1.24 1.26* 1.23 1.78*  CALCIUM 9.1 9.1 8.5*   < > 8.4* 8.7* 8.7* 9.2  MG 1.9 2.0 2.0  --  2.1  --   --   --   PHOS 4.1  --   --   --   --   --   --   --    < > = values in this interval not displayed.   Recent Labs    05/25/23 1217 05/25/23 1853 06/06/23 1526  AST 29 37 26  ALT 23 27 19   ALKPHOS 54 55 56  BILITOT 1.3* 1.3* 1.2  PROT 6.1 6.3* 6.8  ALBUMIN 3.6 3.4* 3.8   Recent Labs    05/15/23 1420 05/25/23 1217 05/25/23 1853 05/26/23 0820 05/27/23 0206 05/28/23 0251 06/06/23 1526  WBC 9.3 9.8 11.6*   < > 8.8 11.6* 8.7  NEUTROABS 8.2* 7.2 9.3*  --   --   --   --   HGB 13.9 14.0 14.4   < > 13.6 14.2 14.1  HCT 41.6 42.8 41.9   < > 39.9 41.8 42.7  MCV 95.0 92.8 89.1   < > 90.7 90.5 93.1  PLT 175.0 297.0 278   < > 251 262 244.0   < > = values in this interval not displayed.   Lab Results  Component Value Date   TSH 1.498 05/25/2023   Lab Results  Component Value Date   HGBA1C 5.0 05/21/2012   Lab Results  Component Value Date   CHOL 123 02/09/2023   HDL 48 02/09/2023    LDLCALC 57 02/09/2023   TRIG 93 02/09/2023   CHOLHDL 2.6 02/09/2023    Significant Diagnostic Results in last 30 days:  No results found.  Assessment/Plan: Chronic diastolic CHF (congestive heart failure) (HCC) on Spironolactone, Torsemide, Jardiance, compensated, trace edema BLE, clear lungs  Persistent atrial fibrillation (HCC) HR 110bpm on Metoprolol 12.5mg  every day, Eliquis  Essential hypertension  runs low, will decrease Torsemide to 10mg /20mg  every day, weight daily, may give 20mg  if weight gains 2-3Ibs/24hr or 3-5Ibs/7days. Obsere.   Allergy to alpha-gal no skin rash presently  Peripheral neuropathy uses wrist brace, Gabapentin is effective, also helped with sleep at night.   Hyperlipidemia  fenofibrate, Simvastatin  Hyponatremia  Na 130 06/06/23  Insomnia  Clorazepate prn  Chronic kidney disease, stage 3a (HCC) Bun/creat 48/1.78 06/06/23  BPH (benign prostatic hyperplasia) Hx of prostate cancer s/p seeding implant radiation tx, x1 urination at night.     Family/ staff Communication: plan of care reviewed with the patient and charge nurse.   Labs/tests ordered:  CBC/diff, CMP/eGFR  Time spend 35 minutes.

## 2023-06-29 NOTE — Assessment & Plan Note (Signed)
uses wrist brace, Gabapentin is effective, also helped with sleep at night.

## 2023-06-29 NOTE — Assessment & Plan Note (Signed)
HR 110bpm on Metoprolol 12.5mg  every day, Eliquis

## 2023-06-29 NOTE — Assessment & Plan Note (Signed)
Hx of prostate cancer s/p seeding implant radiation tx, x1 urination at night.

## 2023-06-29 NOTE — Assessment & Plan Note (Signed)
Bun/creat 48/1.78 06/06/23

## 2023-06-29 NOTE — Assessment & Plan Note (Signed)
fenofibrate, Simvastatin

## 2023-06-29 NOTE — Assessment & Plan Note (Addendum)
Na 130 06/06/23, repeat CBC/diff, CMP/eGFR

## 2023-06-29 NOTE — Progress Notes (Signed)
This encounter was created in error - please disregard.

## 2023-06-29 NOTE — Assessment & Plan Note (Signed)
no skin rash presently

## 2023-06-29 NOTE — Assessment & Plan Note (Signed)
Clorazepate prn

## 2023-06-29 NOTE — Assessment & Plan Note (Signed)
on Spironolactone, Torsemide, Jardiance, compensated, trace edema BLE, clear lungs

## 2023-07-02 ENCOUNTER — Encounter: Payer: Self-pay | Admitting: Nurse Practitioner

## 2023-07-02 ENCOUNTER — Non-Acute Institutional Stay (SKILLED_NURSING_FACILITY): Payer: Medicare Other | Admitting: Nurse Practitioner

## 2023-07-02 DIAGNOSIS — G609 Hereditary and idiopathic neuropathy, unspecified: Secondary | ICD-10-CM

## 2023-07-02 DIAGNOSIS — I480 Paroxysmal atrial fibrillation: Secondary | ICD-10-CM

## 2023-07-02 DIAGNOSIS — I5032 Chronic diastolic (congestive) heart failure: Secondary | ICD-10-CM

## 2023-07-02 DIAGNOSIS — E871 Hypo-osmolality and hyponatremia: Secondary | ICD-10-CM

## 2023-07-02 DIAGNOSIS — I10 Essential (primary) hypertension: Secondary | ICD-10-CM

## 2023-07-02 DIAGNOSIS — N400 Enlarged prostate without lower urinary tract symptoms: Secondary | ICD-10-CM | POA: Diagnosis not present

## 2023-07-02 DIAGNOSIS — N1831 Chronic kidney disease, stage 3a: Secondary | ICD-10-CM | POA: Diagnosis not present

## 2023-07-02 DIAGNOSIS — F5101 Primary insomnia: Secondary | ICD-10-CM | POA: Diagnosis not present

## 2023-07-02 NOTE — Assessment & Plan Note (Signed)
Na 130 06/06/23

## 2023-07-02 NOTE — Assessment & Plan Note (Signed)
Bun/creat 48/1.78 06/06/23

## 2023-07-02 NOTE — Assessment & Plan Note (Signed)
Normalized bp, since Torsemide to 10mg /20mg  every day 06/30/23,  weight daily, may give 20mg  if weight gains 2-3Ibs/24hr or 3-5Ibs/7days. Obsere.

## 2023-07-02 NOTE — Assessment & Plan Note (Signed)
uses wrist brace, Gabapentin is effective, also helped with sleep at night initially.

## 2023-07-02 NOTE — Assessment & Plan Note (Signed)
Also stated his insomnia better with Gabapentin initially, taking Clorazepate prn. Will try Mirtazapine 7.5mg  at bedtime to help the patient to sleep better.

## 2023-07-02 NOTE — Assessment & Plan Note (Signed)
controlled heart rate, on Metoprolol 12.5mg  every day, Eliquis

## 2023-07-02 NOTE — Progress Notes (Signed)
Location:   S[facility]  Nursing Home Room Number: 13 Place of Service:  SNF (31) Provider: Arna Snipe Desyre Calma NP  Donato Schultz, DO  Patient Care Team: Donato Schultz, DO as PCP - General Jodelle Red, MD as PCP - Cardiology (Cardiology) Marvis Repress, MD as Referring Physician (Ophthalmology) Cherlyn Roberts, MD as Consulting Physician (Dermatology) Belva Agee, MD as Consulting Physician (Urology) Belva Agee, MD as Consulting Physician (Urology) Bond, Doran Stabler, MD as Referring Physician (Ophthalmology)  Extended Emergency Contact Information Primary Emergency Contact: Ernest Pine, [place] [address] [place] of [place] Home Phone: [phone] Mobile Phone: [phone] Relation: Friend  Code Status: DNR Goals of care: Advanced Directive information    06/27/2023   12:04 PM  Advanced Directives  Does Patient Have a Medical Advance Directive? Yes  Type of Estate agent of [place];Living will  Does patient want to make changes to medical advance directive? No - Patient declined  Copy of Healthcare Power of Attorney in Chart? Yes - validated most recent copy scanned in chart (See row information)  Would patient like information on creating a medical advance directive? No - Patient declined     Chief Complaint  Patient presents with   Acute Visit    Urinary frequency, insomnia.     HPI:  Pt is a 89 y.o. male seen today for an acute visit for Hx of urinary frequency, desires resuming Flomax. Also stated his insomnia better with Gabapentin initially, taking Clorazepate prn.     Low Bp, normalized since 06/29/23 Torsemide was reduced.  Alpha-gal, no skin rash presently             Peripheral neuropathy, uses wrist brace, Gabapentin is effective, also helped with sleep at night initially.              HTN, controlled.             CHF on Spironolactone, Torsemide, Jardiance, compensated, trace edema  BLE, clear lungs(Torsemide to 10mg /20mg  every day 06/30/23,  weight daily, may give 20mg  if weight gains 2-3Ibs/24hr or 3-5Ibs/7days)             Afib, controlled heart rate, on Metoprolol 12.5mg  every day, Eliquis             Pilonidal cyst, pending surgery             HLD fenofibrate, Simvastatin             Allergic rhinitis, takes Cromolyn nasal spray.             Anxiety/insomnia, Clorazepate prn, adding Mirtazapine             Hyponatremia, Na 130 06/06/23             CKD Bun/creat 48/1.78 06/06/23             Urinary frequency, Hx of prostate cancer s/p seeding implant radiation tx, resumed Tamsulosin 0.4mg  06/30/23    Past Medical History:  Diagnosis Date   Acute diastolic CHF (congestive heart failure) (HCC) 10/25/2021   Anxiety    Aortic insufficiency    Atrial fibrillation (HCC)    Chronic diastolic CHF (congestive heart failure) (HCC)    Chronic kidney disease, stage 3a (HCC)    Diverticulitis    Dyslipidemia    Hypertension    Inferior mesenteric vein thrombosis (HCC)    Mitral regurgitation    Nonrheumatic mitral valve regurgitation 10/28/2021  Paroxysmal atrial fibrillation (HCC) 01/12/2018   Persistent atrial fibrillation (HCC)    Prostate cancer [facility])    Past Surgical History:  Procedure Laterality Date   CARDIOVERSION N/A 03/01/2018   Procedure: CARDIOVERSION;  Surgeon: Lennette Bihari, MD;  Location: [facility] ENDOSCOPY;  Service: Cardiovascular;  Laterality: N/A;   CARDIOVERSION N/A 08/26/2021   Procedure: CARDIOVERSION;  Surgeon: Chrystie Nose, MD;  Location: [facility] ENDOSCOPY;  Service: Cardiovascular;  Laterality: N/A;   CARDIOVERSION N/A 12/06/2021   Procedure: CARDIOVERSION;  Surgeon: Little Ishikawa, MD;  Location: [facility] ENDOSCOPY;  Service: Cardiovascular;  Laterality: N/A;   CATARACT EXTRACTION  01/27/2010   right   CATARACT EXTRACTION  03/04/2010   left   Prostate seed implants     PROSTATE SURGERY      Allergies  Allergen Reactions   Alpha-Gal Other (See  Comments)    Avoidance of meat, dairy products, and other products that contain alpha-gal. Individuals diagnosed with AGS should completely avoid the consumption of mammalian meat, including beef, pork, lamb, venison, goat, and rabbit.    Sulfonamide Derivatives Other (See Comments)    Unknown - patient cannot remember the reaction    Allergies as of 07/02/2023       Reactions   Alpha-gal Other (See Comments)   Avoidance of meat, dairy products, and other products that contain alpha-gal. Individuals diagnosed with AGS should completely avoid the consumption of mammalian meat, including beef, pork, lamb, venison, goat, and rabbit.    Sulfonamide Derivatives Other (See Comments)   Unknown - patient cannot remember the reaction        Medication List        Accurate as of July 02, 2023 11:59 PM. If you have any questions, ask your nurse or doctor.          acetaminophen 325 MG tablet Commonly known as: TYLENOL Take 2 tablets (650 mg total) by mouth every 6 (six) hours as needed for mild pain or moderate pain (or Fever >/= 101).   apixaban 5 MG Tabs tablet Commonly known as: Eliquis Take 1 tablet (5 mg total) by mouth 2 (two) times daily.   ascorbic acid 500 MG tablet Commonly known as: VITAMIN C Take 500 mg by mouth daily.   b complex vitamins tablet Take 1 tablet by mouth daily.   Calcium Carbonate-Vitamin D 600-400 MG-UNIT tablet Take 1 tablet by mouth daily.   clorazepate 7.5 MG tablet Commonly known as: TRANXENE Take 1 tablet (7.5 mg total) by mouth 2 (two) times daily as needed for anxiety.   cromolyn 5.2 MG/ACT nasal spray Commonly known as: NASALCROM Place 2 sprays into both nostrils at bedtime.   empagliflozin 10 MG Tabs tablet Commonly known as: JARDIANCE Take 1 tablet (10 mg total) by mouth daily.   fenofibrate 160 MG tablet Take 160 mg by mouth daily.   fish oil-omega-3 fatty acids 1000 MG capsule Take 1 g by mouth 2 (two) times daily.   gabapentin  100 MG capsule Commonly known as: NEURONTIN Take 1 capsule (100 mg total) by mouth at bedtime.   latanoprost 0.005 % ophthalmic solution Commonly known as: XALATAN Place 1 drop into the left eye at bedtime.   magic mouthwash w/lidocaine Soln Take 5 mLs by mouth 4 (four) times daily as needed for mouth pain. Swish and Spit   metoprolol succinate 25 MG 24 hr tablet Commonly known as: Toprol XL Take 0.5 tablets (12.5 mg total) by mouth daily.   MULTIVITAMIN PO Take 1 tablet  by mouth daily with breakfast.   PreserVision AREDS 2 Caps 2 po qd   simvastatin 10 MG tablet Commonly known as: ZOCOR TAKE 1 TABLET BY MOUTH EVERYDAY AT BEDTIME   spironolactone 25 MG tablet Commonly known as: ALDACTONE Take 1/2 tablet (12.5 mg total) by mouth daily.   torsemide 20 MG tablet Commonly known as: DEMADEX Take 1 tablet (20 mg total) by mouth daily. In case of weight gain 2 to 3 lbs in 24 hr or 5 lbs in 7 days take 2 tablets daily until weight back to baseline.        Review of Systems  Constitutional:  Negative for appetite change, fatigue and fever.  HENT:  Negative for congestion and trouble swallowing.   Eyes:  Negative for visual disturbance.  Respiratory:  Negative for cough, chest tightness, shortness of breath and wheezing.   Cardiovascular:  Positive for leg swelling. Negative for chest pain and palpitations.  Gastrointestinal:  Negative for abdominal pain, constipation, nausea and vomiting.  Genitourinary:  Positive for frequency. Negative for dysuria and urgency.  Skin:  Negative for color change.  Neurological:  Negative for weakness, light-headedness and headaches.  Psychiatric/Behavioral:  Positive for sleep disturbance. Negative for confusion. The patient is nervous/anxious.     Immunization History  Administered Date(s) Administered   Influenza Whole 11/14/2007, 09/30/2008, 09/08/2009, 08/31/2010, 09/03/2013   Influenza, High Dose Seasonal PF 08/19/2015, 09/07/2017,  08/05/2019, 09/28/2022   Influenza-Unspecified 09/07/2014, 09/15/2016, 08/03/2020   PFIZER Comirnaty(Gray Top)Covid-19 Tri-Sucrose Vaccine 04/11/2021   PFIZER(Purple Top)SARS-COV-2 Vaccination 01/18/2020, 02/09/2020, 09/14/2020   Pfizer Covid-19 Vaccine Bivalent Booster 43yrs & up 10/04/2021, 05/15/2022   Pneumococcal Conjugate-13 09/07/2014   Pneumococcal Polysaccharide-23 09/22/2009   Td 12/01/2022   Tdap 12/25/2012   Unspecified SARS-COV-2 Vaccination 09/28/2022   Zoster Recombinant(Shingrix) 02/17/2022, 05/15/2022   Zoster, Live 03/19/2008   Pertinent  Health Maintenance Due  Topic Date Due   INFLUENZA VACCINE  07/26/2023      08/25/2022    9:30 AM 11/24/2022   11:18 AM 12/01/2022    2:42 PM 02/09/2023    1:25 PM 02/19/2023    1:06 PM  Fall Risk  Falls in the past year?   1 1 1   Was there an injury with Fall?   0 1 1  Fall Risk Category Calculator   1 2 2   Fall Risk Category (Retired)   Low    (RETIRED) Patient Fall Risk Level High fall risk High fall risk High fall risk    Patient at Risk for Falls Due to   Impaired balance/gait Impaired balance/gait;Impaired mobility History of fall(s)  Fall risk Follow up   Falls evaluation completed Falls evaluation completed Falls evaluation completed   Functional Status Survey:    Vitals:   07/02/23 1410  BP: 111/72  Pulse: 98  Resp: 19  Temp: (!) 96.9 F (36.1 C)  SpO2: 99%  Weight: 141 lb 3.2 oz (64 kg)   Body mass index is 22.79 kg/m. Physical Exam Vitals and nursing note reviewed.  Constitutional:      Appearance: Normal appearance.  HENT:     Head: Normocephalic and atraumatic.     Nose: Nose normal.     Mouth/Throat:     Mouth: Mucous membranes are moist.  Eyes:     Extraocular Movements: Extraocular movements intact.     Conjunctiva/sclera: Conjunctivae normal.     Pupils: Pupils are equal, round, and reactive to light.  Cardiovascular:     Rate and Rhythm: Normal rate. Rhythm  irregular.     Heart sounds: No  murmur heard. Pulmonary:     Effort: Pulmonary effort is normal.     Breath sounds: Normal breath sounds. No wheezing or rales.  Abdominal:     General: Bowel sounds are normal.     Palpations: Abdomen is soft.     Tenderness: There is no abdominal tenderness.  Musculoskeletal:     Cervical back: Normal range of motion and neck supple.     Right lower leg: Edema present.     Left lower leg: Edema present.     Comments: Trace edema BLE  Skin:    General: Skin is warm and dry.  Neurological:     General: No focal deficit present.     Mental Status: He is alert and oriented to person, place, and time. Mental status is at baseline.     Motor: No weakness.     Gait: Gait normal.  Psychiatric:        Behavior: Behavior normal.        Thought Content: Thought content normal.        Judgment: Judgment normal.     Comments: Appears anxious.      Labs reviewed: Recent Labs    08/23/22 0312 08/24/22 0601 08/25/22 0619 08/31/22 1120 05/27/23 0206 05/28/23 0251 05/29/23 0337 06/06/23 1526  NA 140 137 137   < > 128* 132* 132* 130*  K 4.8 3.7 3.6   < > 4.0 4.0 4.3 4.9  CL 107 101 101   < > 93* 94* 97* 91*  CO2 24 28 30    < > 25 28 26 29   GLUCOSE 115* 92 94   < > 104* 111* 108* 90  BUN 20 26* 32*   < > 34* 41* 45* 48*  CREATININE 1.06 1.24 1.12   < > 1.24 1.26* 1.23 1.78*  CALCIUM 9.1 9.1 8.5*   < > 8.4* 8.7* 8.7* 9.2  MG 1.9 2.0 2.0  --  2.1  --   --   --   PHOS 4.1  --   --   --   --   --   --   --    < > = values in this interval not displayed.   Recent Labs    05/25/23 1217 05/25/23 1853 06/06/23 1526  AST 29 37 26  ALT 23 27 19   ALKPHOS 54 55 56  BILITOT 1.3* 1.3* 1.2  PROT 6.1 6.3* 6.8  ALBUMIN 3.6 3.4* 3.8   Recent Labs    05/15/23 1420 05/25/23 1217 05/25/23 1853 05/26/23 0820 05/27/23 0206 05/28/23 0251 06/06/23 1526  WBC 9.3 9.8 11.6*   < > 8.8 11.6* 8.7  NEUTROABS 8.2* 7.2 9.3*  --   --   --   --   HGB 13.9 14.0 14.4   < > 13.6 14.2 14.1  HCT 41.6  42.8 41.9   < > 39.9 41.8 42.7  MCV 95.0 92.8 89.1   < > 90.7 90.5 93.1  PLT 175.0 297.0 278   < > 251 262 244.0   < > = values in this interval not displayed.   Lab Results  Component Value Date   TSH 1.498 05/25/2023   Lab Results  Component Value Date   HGBA1C 5.0 05/21/2012   Lab Results  Component Value Date   CHOL 123 02/09/2023   HDL 48 02/09/2023   LDLCALC 57 02/09/2023   TRIG 93 02/09/2023   CHOLHDL 2.6 02/09/2023  Significant Diagnostic Results in last 30 days:  No results found.  Assessment/Plan: BPH (benign prostatic hyperplasia) Hx of prostate cancer s/p seeding implant radiation tx, x1 urination at night. Resumed Tamsulosin 0.4mg  every day 06/30/23 per pt's request.   Insomnia  Also stated his insomnia better with Gabapentin initially, taking Clorazepate prn. Will try Mirtazapine 7.5mg  at bedtime to help the patient to sleep better.     Essential hypertension Normalized bp, since Torsemide to 10mg /20mg  every day 06/30/23,  weight daily, may give 20mg  if weight gains 2-3Ibs/24hr or 3-5Ibs/7days. Obsere.   Peripheral neuropathy uses wrist brace, Gabapentin is effective, also helped with sleep at night initially.  Chronic diastolic CHF (congestive heart failure) (HCC) on Spironolactone, Torsemide, Jardiance, compensated, trace edema BLE, clear lungs(Torsemide to 10mg /20mg  every day 06/30/23,  weight daily, may give 20mg  if weight gains 2-3Ibs/24hr or 3-5Ibs/7days)  Paroxysmal atrial fibrillation (HCC)  controlled heart rate, on Metoprolol 12.5mg  every day, Eliquis  Hyponatremia  Na 130 06/06/23  Chronic kidney disease, stage 3a (HCC) Bun/creat 48/1.78 06/06/23    Family/ staff Communication: plan of care reviewed with the patient and charge nurse.   Labs/tests ordered:  none  Time spend 35 minutes.

## 2023-07-02 NOTE — Assessment & Plan Note (Signed)
on Spironolactone, Torsemide, Jardiance, compensated, trace edema BLE, clear lungs(Torsemide to 10mg /20mg  every day 06/30/23,  weight daily, may give 20mg  if weight gains 2-3Ibs/24hr or 3-5Ibs/7days)

## 2023-07-02 NOTE — Assessment & Plan Note (Signed)
Hx of prostate cancer s/p seeding implant radiation tx, x1 urination at night. Resumed Tamsulosin 0.4mg  every day 06/30/23 per pt's request.

## 2023-07-03 LAB — BASIC METABOLIC PANEL
BUN: 35 — AB (ref 4–21)
CO2: 28 — AB (ref 13–22)
Chloride: 96 — AB (ref 99–108)
Creatinine: 1.4 — AB (ref 0.6–1.3)
Glucose: 71
Potassium: 4.7 mEq/L (ref 3.5–5.1)
Sodium: 131 — AB (ref 137–147)

## 2023-07-03 LAB — CBC: RBC: 4.84 (ref 3.87–5.11)

## 2023-07-03 LAB — COMPREHENSIVE METABOLIC PANEL
Albumin: 3.4 — AB (ref 3.5–5.0)
Calcium: 8.6 — AB (ref 8.7–10.7)
Globulin: 3

## 2023-07-03 LAB — CBC AND DIFFERENTIAL
HCT: 44 (ref 41–53)
Hemoglobin: 14.7 (ref 13.5–17.5)
Neutrophils Absolute: 4275
Platelets: 249 10*3/uL (ref 150–400)
WBC: 7.5

## 2023-07-03 LAB — HEPATIC FUNCTION PANEL
ALT: 14 U/L (ref 10–40)
AST: 24 (ref 14–40)
Alkaline Phosphatase: 66 (ref 25–125)

## 2023-07-04 ENCOUNTER — Ambulatory Visit: Payer: Self-pay | Admitting: Licensed Clinical Social Worker

## 2023-07-04 NOTE — Patient Instructions (Signed)
Visit Information  Thank you for taking time to visit with me today. Please don't hesitate to contact me if I can be of assistance to you.   Following are the goals we discussed today:   Goals Addressed             This Visit's Progress    Patient Stated he is planning on having a procedure done on his back. He is staying at SNF now so that he can receive care once procedure on back is done       Interventions  Spoke with client about client needs Discussed program support with LCSW. Client said he is staying now in SNF. He said he is planning on having back procedure for cyst removal from his back. He wanted to be in SNF for care once back procedure is done Client said he has appointment tomorrow with surgeon Discussed pain issues of client.  Client did not mention any significant pain issues Client has support of friend, Wonda Olds Client had significant hearing issues during phone call. Not sure if client was not wearing hearing aid or had bad phone connection. But client had hearing issues during call Encouraged client to call LCSW as needed for SW support at [phone].        Our next appointment is by telephone on 08/28/23 at 10:00 AM   Please call the care guide team at [phone] if you need to cancel or reschedule your appointment.   If you are experiencing a Mental Health or Behavioral Health Crisis or need someone to talk to, please go to [facility] [address], [place] [phone])   The patient verbalized understanding of instructions, educational materials, and care plan provided today and DECLINED offer to receive copy of patient instructions, educational materials, and care plan.   The patient has been provided with contact information for the care management team and has been advised to call with any health related questions or concerns.   Kelton Pillar.Eldin Bonsell MSW, LCSW Licensed Visual merchandiser [facility]  Care Management [phone]

## 2023-07-04 NOTE — Patient Outreach (Signed)
  Care Coordination   Follow Up Visit Note   07/04/2023 Name: Mark Stark. MRN: [id number] DOB: May 15, 1928  Mark Stark. is a 89 y.o. year old male who sees Zola Button, Grayling Congress, DO for primary care. I spoke with  Mark Stark. by phone today.  What matters to the patients health and wellness today?  Patient Stated he is planning on having a procedure done on his back. He is staying at SNF now so that he can receive care once procedure on back is done    Goals Addressed             This Visit's Progress    Patient Stated he is planning on having a procedure done on his back. He is staying at SNF now so that he can receive care once procedure on back is done       Interventions  Spoke with client about client needs Discussed program support with LCSW. Client said he is staying now in SNF. He said he is planning on having back procedure for cyst removal from his back. He wanted to be in SNF for care once back procedure is done Client said he has appointment tomorrow with surgeon Discussed pain issues of client.  Client did not mention any significant pain issues Client has support of friend, Wonda Olds Client had significant hearing issues during phone call. Not sure if client was not wearing hearing aid or had bad phone connection. But client had hearing issues during call Encouraged client to call LCSW as needed for SW support at [phone].        SDOH assessments and interventions completed:  Yes  SDOH Interventions Today    Flowsheet Row Most Recent Value  SDOH Interventions   Depression Interventions/Treatment  Counseling  Physical Activity Interventions Other (Comments)  [some mobility challenges]  Stress Interventions Provide Counseling  [client has stress related to managing medical needs]        Care Coordination Interventions:  Yes, provided   Interventions Today    Flowsheet Row Most Recent Value  Chronic Disease   Chronic disease during  today's visit Other  [spoke with client about client needs]  General Interventions   General Interventions Discussed/Reviewed General Interventions Discussed, Community Resources  Exercise Interventions   Exercise Discussed/Reviewed Physical Activity  [client has some mobility challenges]  Education Interventions   Education Provided Provided Education  Provided Verbal Education On [employer] said he is staying at [facility] currently. He said he is planning on having procedure for his back and wanted to be in SNF for care following his procedure. He said he has appointment with surgeon tomorrow]  Mental Health Interventions   Mental Health Discussed/Reviewed Coping Strategies  [client feels that his mood is stable. He did not mention any mood issues]  Pharmacy Interventions   Pharmacy Dicussed/Reviewed Pharmacy Topics Discussed        Follow up plan: Follow up call scheduled for 08/28/23 at 10:00 AM    Encounter Outcome:  Pt. Visit Completed   Kelton Pillar.Rossie Scarfone MSW, LCSW Licensed Visual merchandiser [facility] Care Management [phone]

## 2023-07-05 DIAGNOSIS — L89890 Pressure ulcer of other site, unstageable: Secondary | ICD-10-CM | POA: Diagnosis not present

## 2023-07-09 ENCOUNTER — Encounter: Payer: Self-pay | Admitting: Orthopedic Surgery

## 2023-07-09 ENCOUNTER — Non-Acute Institutional Stay (SKILLED_NURSING_FACILITY): Payer: Medicare Other | Admitting: Orthopedic Surgery

## 2023-07-09 DIAGNOSIS — G609 Hereditary and idiopathic neuropathy, unspecified: Secondary | ICD-10-CM | POA: Diagnosis not present

## 2023-07-09 DIAGNOSIS — H401122 Primary open-angle glaucoma, left eye, moderate stage: Secondary | ICD-10-CM

## 2023-07-09 DIAGNOSIS — J309 Allergic rhinitis, unspecified: Secondary | ICD-10-CM

## 2023-07-09 DIAGNOSIS — F5101 Primary insomnia: Secondary | ICD-10-CM

## 2023-07-09 DIAGNOSIS — E785 Hyperlipidemia, unspecified: Secondary | ICD-10-CM

## 2023-07-09 DIAGNOSIS — F419 Anxiety disorder, unspecified: Secondary | ICD-10-CM | POA: Diagnosis not present

## 2023-07-09 DIAGNOSIS — N400 Enlarged prostate without lower urinary tract symptoms: Secondary | ICD-10-CM

## 2023-07-09 DIAGNOSIS — I480 Paroxysmal atrial fibrillation: Secondary | ICD-10-CM

## 2023-07-09 DIAGNOSIS — I5032 Chronic diastolic (congestive) heart failure: Secondary | ICD-10-CM

## 2023-07-09 MED ORDER — EMPAGLIFLOZIN 10 MG PO TABS
10.0000 mg | ORAL_TABLET | Freq: Every day | ORAL | 0 refills | Status: DC
Start: 2023-07-09 — End: 2023-10-23

## 2023-07-09 MED ORDER — METOPROLOL SUCCINATE ER 25 MG PO TB24
12.5000 mg | ORAL_TABLET | Freq: Every day | ORAL | 0 refills | Status: DC
Start: 2023-07-09 — End: 2023-10-08

## 2023-07-09 MED ORDER — TORSEMIDE 10 MG PO TABS
10.0000 mg | ORAL_TABLET | Freq: Every day | ORAL | 0 refills | Status: DC
Start: 2023-07-09 — End: 2023-11-05

## 2023-07-09 MED ORDER — FENOFIBRATE 160 MG PO TABS
160.0000 mg | ORAL_TABLET | Freq: Every day | ORAL | 0 refills | Status: DC
Start: 2023-07-09 — End: 2023-12-28

## 2023-07-09 MED ORDER — LATANOPROST 0.005 % OP SOLN
1.0000 [drp] | Freq: Every day | OPHTHALMIC | 0 refills | Status: DC
Start: 2023-07-09 — End: 2024-10-02

## 2023-07-09 MED ORDER — TAMSULOSIN HCL 0.4 MG PO CAPS: 0.4000 mg | ORAL_CAPSULE | Freq: Every day | ORAL | 0 refills | Status: DC

## 2023-07-09 MED ORDER — MIRTAZAPINE 7.5 MG PO TABS
7.5000 mg | ORAL_TABLET | Freq: Every day | ORAL | 0 refills | Status: DC
Start: 2023-07-09 — End: 2023-07-25

## 2023-07-09 MED ORDER — CLORAZEPATE DIPOTASSIUM 7.5 MG PO TABS
7.5000 mg | ORAL_TABLET | Freq: Two times a day (BID) | ORAL | 0 refills | Status: DC | PRN
Start: 2023-07-09 — End: 2023-10-08

## 2023-07-09 MED ORDER — GABAPENTIN 100 MG PO CAPS
200.0000 mg | ORAL_CAPSULE | Freq: Every day | ORAL | 0 refills | Status: DC
Start: 2023-07-09 — End: 2023-08-09

## 2023-07-09 MED ORDER — TORSEMIDE 20 MG PO TABS: 20.0000 mg | ORAL_TABLET | Freq: Every day | ORAL | 0 refills | Status: DC

## 2023-07-09 MED ORDER — APIXABAN 5 MG PO TABS
5.0000 mg | ORAL_TABLET | Freq: Two times a day (BID) | ORAL | 0 refills | Status: DC
Start: 2023-07-09 — End: 2023-10-08

## 2023-07-09 MED ORDER — SPIRONOLACTONE 25 MG PO TABS
12.5000 mg | ORAL_TABLET | Freq: Every day | ORAL | 0 refills | Status: DC
Start: 2023-07-09 — End: 2023-10-08

## 2023-07-09 MED ORDER — SIMVASTATIN 10 MG PO TABS: 10.0000 mg | ORAL_TABLET | Freq: Every day | ORAL | 0 refills | Status: DC

## 2023-07-09 MED ORDER — CROMOLYN SODIUM 5.2 MG/ACT NA AERS
2.0000 | INHALATION_SPRAY | Freq: Every day | NASAL | 0 refills | Status: DC
Start: 2023-07-09 — End: 2023-08-09

## 2023-07-09 NOTE — Progress Notes (Signed)
Location:   [facility]  Nursing Home Room Number: 13-A Place of Service:  SNF [phone])  Provider: Hazle Nordmann, NP  PCP: Donato Schultz, DO Patient Care Team: Donato Schultz, DO as PCP - General Jodelle Red, MD as PCP - Cardiology (Cardiology) Marvis Repress, MD as Referring Physician (Ophthalmology) Cherlyn Roberts, MD as Consulting Physician (Dermatology) Belva Agee, MD as Consulting Physician (Urology) Belva Agee, MD as Consulting Physician (Urology) Bond, Doran Stabler, MD as Referring Physician (Ophthalmology)  Extended Emergency Contact Information Primary Emergency Contact: Ernest Pine, [place] [address] [address] [place] of [place] Home Phone: [phone] Mobile Phone: [phone] Relation: Friend  Code Status: DNR Goals of care:  Advanced Directive information    07/09/2023   10:58 AM  Advanced Directives  Does Patient Have a Medical Advance Directive? Yes  Type of Advance Directive Living will;Healthcare Power of Attorney  Does patient want to make changes to medical advance directive? No - Patient declined  Copy of Healthcare Power of Attorney in Chart? Yes - validated most recent copy scanned in chart (See row information)     Allergies  Allergen Reactions   Alpha-Gal Other (See Comments)    Avoidance of meat, dairy products, and other products that contain alpha-gal. Individuals diagnosed with AGS should completely avoid the consumption of mammalian meat, including beef, pork, lamb, venison, goat, and rabbit.    Sulfonamide Derivatives Other (See Comments)    Unknown - patient cannot remember the reaction    Chief Complaint  Patient presents with   Discharge Note    Discharge from SNF    HPI:  89 y.o. male seen today for discharge evaluation.   He currently resides on the skilled nursing unit at [facility]. PMH: CHF, aortic insufficiency, mitral valve regurgitation, HTN, PAF,  peripheral neuropathy, CKD, prostate cancer, thrombocytopenia, diverticulitis, macular degeneration, and weakness.   Hospitalized 05/31- 06/04 due to acute exacerbation of CHF, hyponatremia and right extremity cellulitis. Echo LVEF 70-75%. He was given IV furosemide and symptoms improved. Discharged with torsemide and spirolactone.Cardiology also recommended starting Jardiance. No recent weight fluctuations, edema or sob. Cellulitis right leg resolved with IV ceftriaxone and oral cephalexin. Na+ 131 07/03/2023.   Today, he plans to discharge home 07/16. He lives alone. He has neighbors and friends that check on him often. He also has a [employer] manager. No recent falls or injuries at [facility]. HH PT/OT ordered. Appetite fair. LBM today. Afebrile. Vitals stable.   07/03 admission weight 141 lbs 07/15- 141.3 lbs     Past Medical History:  Diagnosis Date   Acute diastolic CHF (congestive heart failure) (HCC) 10/25/2021   Anxiety    Aortic insufficiency    Atrial fibrillation (HCC)    Chronic diastolic CHF (congestive heart failure) (HCC)    Chronic kidney disease, stage 3a (HCC)    Diverticulitis    Dyslipidemia    Hypertension    Inferior mesenteric vein thrombosis (HCC)    Mitral regurgitation    Nonrheumatic mitral valve regurgitation 10/28/2021   Paroxysmal atrial fibrillation (HCC) 01/12/2018   Persistent atrial fibrillation (HCC)    Prostate cancer [facility])     Past Surgical History:  Procedure Laterality Date   CARDIOVERSION N/A 03/01/2018   Procedure: CARDIOVERSION;  Surgeon: Lennette Bihari, MD;  Location: [facility] ENDOSCOPY;  Service: Cardiovascular;  Laterality: N/A;   CARDIOVERSION N/A 08/26/2021   Procedure: CARDIOVERSION;  Surgeon: Chrystie Nose, MD;  Location: [facility] ENDOSCOPY;  Service: Cardiovascular;  Laterality: N/A;   CARDIOVERSION N/A 12/06/2021   Procedure: CARDIOVERSION;  Surgeon: Little Ishikawa, MD;  Location[facility] ENDOSCOPY;  Service: Cardiovascular;   Laterality: N/A;   CATARACT EXTRACTION  01/27/2010   right   CATARACT EXTRACTION  03/04/2010   left   Prostate seed implants     PROSTATE SURGERY        reports that he has never smoked. He has never used smokeless tobacco. He reports that he does not drink alcohol and does not use drugs. Social History   Socioeconomic History   Marital status: Widowed    Spouse name: Not on file   Number of children: Not on file   Years of education: Not on file   Highest education level: Not on file  Occupational History   Occupation: Professor---RETIRED    Employer: RETIRED    Comment: Retired  Tobacco Use   Smoking status: Never   Smokeless tobacco: Never  Vaping Use   Vaping status: Never Used  Substance and Sexual Activity   Alcohol use: No    Comment: Little wine occasionally    Drug use: No   Sexual activity: Never    Partners: Female  Other Topics Concern   Not on file  Social History Narrative   Not on file   Social Determinants of Health   Financial Resource Strain: Low Risk  (02/17/2022)   Overall Financial Resource Strain (CARDIA)    Difficulty of Paying Living Expenses: Not hard at all  Food Insecurity: No Food Insecurity (06/01/2023)   Hunger Vital Sign    Worried About Running Out of Food in the Last Year: Never true    Ran Out of Food in the Last Year: Never true  Transportation Needs: No Transportation Needs (06/01/2023)   PRAPARE - Administrator, Civil Service (Medical): No    Lack of Transportation (Non-Medical): No  Physical Activity: Insufficiently Active (07/04/2023)   Exercise Vital Sign    Days of Exercise per Week: 1 day    Minutes of Exercise per Session: 10 min  Stress: Stress Concern Present (07/04/2023)   [employer] of Occupational Health - Occupational Stress Questionnaire    Feeling of Stress : To some extent  Social Connections: Moderately Integrated (09/26/2022)   Social Connection and Isolation Panel [NHANES]    Frequency of  Communication with Friends and Family: Twice a week    Frequency of Social Gatherings with Friends and Family: Twice a week    Attends Religious Services: More than 4 times per year    Active Member of [employer] or Organizations: Yes    Attends Banker Meetings: Never    Marital Status: Widowed  Recent Concern: Social Connections - Moderately Isolated (08/29/2022)   Social Connection and Isolation Panel [NHANES]    Frequency of Communication with Friends and Family: Three times a week    Frequency of Social Gatherings with Friends and Family: Twice a week    Attends Religious Services: More than 4 times per year    Active Member of [employer] or Organizations: No    Attends Banker Meetings: Never    Marital Status: Widowed  Intimate Partner Violence: Not At Risk (05/26/2023)   Humiliation, Afraid, Rape, and Kick questionnaire    Fear of Current or Ex-Partner: No    Emotionally Abused: No    Physically Abused: No    Sexually Abused: No   Functional Status Survey:  Allergies  Allergen Reactions   Alpha-Gal Other (See Comments)    Avoidance of meat, dairy products, and other products that contain alpha-gal. Individuals diagnosed with AGS should completely avoid the consumption of mammalian meat, including beef, pork, lamb, venison, goat, and rabbit.    Sulfonamide Derivatives Other (See Comments)    Unknown - patient cannot remember the reaction    Pertinent  Health Maintenance Due  Topic Date Due   INFLUENZA VACCINE  07/26/2023    Medications: Allergies as of 07/09/2023       Reactions   Alpha-gal Other (See Comments)   Avoidance of meat, dairy products, and other products that contain alpha-gal. Individuals diagnosed with AGS should completely avoid the consumption of mammalian meat, including beef, pork, lamb, venison, goat, and rabbit.    Sulfonamide Derivatives Other (See Comments)   Unknown - patient cannot remember the reaction        Medication  List        Accurate as of July 09, 2023 11:02 AM. If you have any questions, ask your nurse or doctor.          acetaminophen 325 MG tablet Commonly known as: TYLENOL Take 2 tablets (650 mg total) by mouth every 6 (six) hours as needed for mild pain or moderate pain (or Fever >/= 101).   apixaban 5 MG Tabs tablet Commonly known as: Eliquis Take 1 tablet (5 mg total) by mouth 2 (two) times daily.   ascorbic acid 500 MG tablet Commonly known as: VITAMIN C Take 500 mg by mouth daily.   b complex vitamins tablet Take 1 tablet by mouth daily.   Calcium Carbonate-Vitamin D 600-400 MG-UNIT tablet Take 1 tablet by mouth daily.   clorazepate 7.5 MG tablet Commonly known as: TRANXENE Take 1 tablet (7.5 mg total) by mouth 2 (two) times daily as needed for anxiety.   cromolyn 5.2 MG/ACT nasal spray Commonly known as: NASALCROM Place 2 sprays into both nostrils at bedtime.   empagliflozin 10 MG Tabs tablet Commonly known as: JARDIANCE Take 1 tablet (10 mg total) by mouth daily.   fenofibrate 160 MG tablet Take 160 mg by mouth daily.   fish oil-omega-3 fatty acids 1000 MG capsule Take 1 g by mouth 2 (two) times daily.   gabapentin 100 MG capsule Commonly known as: NEURONTIN Take 200 mg by mouth at bedtime. What changed: Another medication with the same name was removed. Continue taking this medication, and follow the directions you see here. Changed by: Luberta Robertson Cecilia Nishikawa   latanoprost 0.005 % ophthalmic solution Commonly known as: XALATAN Place 1 drop into the left eye at bedtime.   magic mouthwash w/lidocaine Soln Take 5 mLs by mouth 4 (four) times daily as needed for mouth pain. Swish and Spit   metoprolol succinate 25 MG 24 hr tablet Commonly known as: Toprol XL Take 0.5 tablets (12.5 mg total) by mouth daily.   mirtazapine 7.5 MG tablet Commonly known as: REMERON Take 7.5 mg by mouth at bedtime.   PreserVision AREDS 2 Caps 2 po qd   simvastatin 10 MG  tablet Commonly known as: ZOCOR TAKE 1 TABLET BY MOUTH EVERYDAY AT BEDTIME   spironolactone 25 MG tablet Commonly known as: ALDACTONE Take 1/2 tablet (12.5 mg total) by mouth daily.   tamsulosin 0.4 MG Caps capsule Commonly known as: FLOMAX Take 0.4 mg by mouth daily.   THEREMS PO Take 1 tablet by mouth daily.   torsemide 10 MG tablet Commonly known as: DEMADEX Take  10 mg by mouth daily.   torsemide 20 MG tablet Commonly known as: DEMADEX Take 1 tablet (20 mg total) by mouth daily. In case of weight gain 2 to 3 lbs in 24 hr or 5 lbs in 7 days take 2 tablets daily until weight back to baseline.        Review of Systems  Constitutional:  Negative for activity change and appetite change.  HENT:  Positive for congestion. Negative for trouble swallowing.   Eyes:  Negative for visual disturbance.  Respiratory:  Negative for cough, shortness of breath and wheezing.   Cardiovascular:  Negative for chest pain and leg swelling.  Gastrointestinal:  Negative for abdominal distention and abdominal pain.  Genitourinary:  Positive for frequency. Negative for dysuria and hematuria.  Musculoskeletal:  Positive for gait problem.  Skin:  Negative for wound.  Neurological:  Positive for weakness. Negative for dizziness and light-headedness.  Psychiatric/Behavioral:  Positive for sleep disturbance. Negative for confusion and dysphoric mood. The patient is nervous/anxious.     Vitals:   07/09/23 1052  BP: 120/70  Pulse: 80  Resp: 18  Temp: (!) 97.3 F (36.3 C)  SpO2: 96%  Weight: 141 lb 4.8 oz (64.1 kg)  Height: 5\' 6"  (1.676 m)   Body mass index is 22.81 kg/m. Physical Exam Vitals reviewed.  Constitutional:      General: He is not in acute distress. HENT:     Head: Normocephalic.  Eyes:     General:        Right eye: No discharge.        Left eye: No discharge.  Cardiovascular:     Rate and Rhythm: Normal rate. Rhythm irregular.     Pulses: Normal pulses.     Heart  sounds: Normal heart sounds.  Pulmonary:     Effort: Pulmonary effort is normal. No respiratory distress.     Breath sounds: Normal breath sounds. No wheezing or rales.  Abdominal:     General: Bowel sounds are normal.     Palpations: Abdomen is soft.  Musculoskeletal:     Cervical back: Neck supple.     Right lower leg: No edema.     Left lower leg: No edema.  Skin:    General: Skin is warm.     Capillary Refill: Capillary refill takes less than 2 seconds.  Neurological:     General: No focal deficit present.     Mental Status: He is alert and oriented to person, place, and time.     Motor: No weakness.     Gait: Gait normal.  Psychiatric:        Mood and Affect: Mood normal.     Labs reviewed: Basic Metabolic Panel: Recent Labs    08/23/22 0312 08/24/22 0601 08/25/22 0619 08/31/22 1120 05/27/23 0206 05/28/23 0251 05/29/23 0337 06/06/23 1526 07/03/23 0000  NA 140 137 137   < > 128* 132* 132* 130* 131*  K 4.8 3.7 3.6   < > 4.0 4.0 4.3 4.9 4.7  CL 107 101 101   < > 93* 94* 97* 91* 96*  CO2 24 28 30    < > 25 28 26 29  28*  GLUCOSE 115* 92 94   < > 104* 111* 108* 90  --   BUN 20 26* 32*   < > 34* 41* 45* 48* 35*  CREATININE 1.06 1.24 1.12   < > 1.24 1.26* 1.23 1.78* 1.4*  CALCIUM 9.1 9.1 8.5*   < > 8.4*  8.7* 8.7* 9.2 8.6*  MG 1.9 2.0 2.0  --  2.1  --   --   --   --   PHOS 4.1  --   --   --   --   --   --   --   --    < > = values in this interval not displayed.   Liver Function Tests: Recent Labs    05/25/23 1217 05/25/23 1853 06/06/23 1526 07/03/23 0000  AST 29 37 26 24  ALT 23 27 19 14   ALKPHOS 54 55 56 66  BILITOT 1.3* 1.3* 1.2  --   PROT 6.1 6.3* 6.8  --   ALBUMIN 3.6 3.4* 3.8 3.4*   No results for input(s): "LIPASE", "AMYLASE" in the last 8760 hours. No results for input(s): "AMMONIA" in the last 8760 hours. CBC: Recent Labs    05/25/23 1217 05/25/23 1853 05/26/23 0820 05/27/23 0206 05/28/23 0251 06/06/23 1526 07/03/23 0000  WBC 9.8 11.6*    < > 8.8 11.6* 8.7 7.5  NEUTROABS 7.2 9.3*  --   --   --   --  4,275.00  HGB 14.0 14.4   < > 13.6 14.2 14.1 14.7  HCT 42.8 41.9   < > 39.9 41.8 42.7 44  MCV 92.8 89.1   < > 90.7 90.5 93.1  --   PLT 297.0 278   < > 251 262 244.0 249   < > = values in this interval not displayed.   Cardiac Enzymes: Recent Labs    08/22/22 1853 08/23/22 0312 11/24/22 1226  CKTOTAL 203 130 251   BNP: Invalid input(s): "POCBNP" CBG: No results for input(s): "GLUCAP" in the last 8760 hours.  Procedures and Imaging Studies During Stay: No results found.  Assessment/Plan:   1. Chronic diastolic CHF (congestive heart failure) (HCC) - hospitalized 05/31-06/04 - BNP was 455.5 - echo LVEF 70-75% - compensated - cont daily weights - cont Jardiance, torsemide and spirolactone - torsemide (DEMADEX) 20 MG tablet; Take 1 tablet (20 mg total) by mouth daily. In case of weight gain 2 to 3 lbs in 24 hr or 5 lbs in 7 days take 2 tablets daily until weight back to baseline.  Dispense: 30 tablet; Refill: 0 - torsemide (DEMADEX) 10 MG tablet; Take 1 tablet (10 mg total) by mouth daily.  Dispense: 30 tablet; Refill: 0 - spironolactone (ALDACTONE) 25 MG tablet; Take 1/2 tablet (12.5 mg total) by mouth daily.  Dispense: 15 tablet; Refill: 0 - empagliflozin (JARDIANCE) 10 MG TABS tablet; Take 1 tablet (10 mg total) by mouth daily.  Dispense: 30 tablet; Refill: 0  2. Hyperlipidemia, unspecified hyperlipidemia type - cont simvastatin and fenofibrate - simvastatin (ZOCOR) 10 MG tablet; Take 1 tablet (10 mg total) by mouth at bedtime.  Dispense: 30 tablet; Refill: 0 - fenofibrate 160 MG tablet; Take 1 tablet (160 mg total) by mouth daily.  Dispense: 30 tablet; Refill: 0  3. Anxiety - no recent panic attacks - cont clorazepate prn - clorazepate (TRANXENE) 7.5 MG tablet; Take 1 tablet (7.5 mg total) by mouth 2 (two) times daily as needed for anxiety.  Dispense: 30 tablet; Refill: 0  4. Paroxysmal atrial fibrillation  (HCC) - HR< 100 with metoprolol - cont Eliquis for clot prevention - metoprolol succinate (TOPROL XL) 25 MG 24 hr tablet; Take 0.5 tablets (12.5 mg total) by mouth daily.  Dispense: 15 tablet; Refill: 0 - apixaban (ELIQUIS) 5 MG TABS tablet; Take 1 tablet (5 mg total) by mouth  2 (two) times daily.  Dispense: 60 tablet; Refill: 0  5. Benign prostatic hyperplasia without lower urinary tract symptoms - started 07/06 - tamsulosin (FLOMAX) 0.4 MG CAPS capsule; Take 1 capsule (0.4 mg total) by mouth daily.  Dispense: 30 capsule; Refill: 0  6. Idiopathic peripheral neuropathy - stable with gabapentin - gabapentin (NEURONTIN) 100 MG capsule; Take 2 capsules (200 mg total) by mouth at bedtime.  Dispense: 60 capsule; Refill: 0  7. Allergic rhinitis, unspecified seasonality, unspecified trigger - cromolyn (NASALCROM) 5.2 MG/ACT nasal spray; Place 2 sprays into both nostrils at bedtime.  Dispense: 7 mL; Refill: 0  8. Primary insomnia - started 07/08 - mirtazapine (REMERON) 7.5 MG tablet; Take 1 tablet (7.5 mg total) by mouth at bedtime.  Dispense: 30 tablet; Refill: 0  9. Primary open angle glaucoma (POAG) of left eye, moderate stage - latanoprost (XALATAN) 0.005 % ophthalmic solution; Place 1 drop into the left eye at bedtime.  Dispense: 2.5 mL; Refill: 0    Patient is being discharged with the following home health services:  PT/OT  Patient is being discharged with the following durable medical equipment:  None  Patient has been advised to f/u with their PCP in 1-2 weeks to bring them up to date on their rehab stay.  Social services at facility was responsible for arranging this appointment.  Pt was provided with a 30 day supply of prescriptions for medications and refills must be obtained from their PCP.  For controlled substances, a more limited supply may be provided adequate until PCP appointment only.  Future labs/tests needed:  bmp

## 2023-07-10 ENCOUNTER — Non-Acute Institutional Stay (SKILLED_NURSING_FACILITY): Payer: Medicare Other | Admitting: Family Medicine

## 2023-07-10 DIAGNOSIS — Z8546 Personal history of malignant neoplasm of prostate: Secondary | ICD-10-CM

## 2023-07-10 DIAGNOSIS — I10 Essential (primary) hypertension: Secondary | ICD-10-CM

## 2023-07-10 DIAGNOSIS — N1831 Chronic kidney disease, stage 3a: Secondary | ICD-10-CM

## 2023-07-10 DIAGNOSIS — Z91018 Allergy to other foods: Secondary | ICD-10-CM

## 2023-07-10 DIAGNOSIS — I480 Paroxysmal atrial fibrillation: Secondary | ICD-10-CM

## 2023-07-10 DIAGNOSIS — I5033 Acute on chronic diastolic (congestive) heart failure: Secondary | ICD-10-CM | POA: Diagnosis not present

## 2023-07-10 DIAGNOSIS — E785 Hyperlipidemia, unspecified: Secondary | ICD-10-CM

## 2023-07-10 DIAGNOSIS — G609 Hereditary and idiopathic neuropathy, unspecified: Secondary | ICD-10-CM

## 2023-07-10 NOTE — Progress Notes (Signed)
Provider:  Jacalyn Lefevre, MD Location:      Place of Service:     PCP: Zola Button, Grayling Congress, DO Patient Care Team: Zola Button, Grayling Congress, DO as PCP - General Jodelle Red, MD as PCP - Cardiology (Cardiology) Marvis Repress, MD as Referring Physician (Ophthalmology) Cherlyn Roberts, MD as Consulting Physician (Dermatology) Belva Agee, MD as Consulting Physician (Urology) Belva Agee, MD as Consulting Physician (Urology) Bond, Doran Stabler, MD as Referring Physician (Ophthalmology)  Extended Emergency Contact Information Primary Emergency Contact: Ernest Pine, [place] [address] D[place] of [place] Home Phone: [phone] Mobile Phone: [phone] Relation: Friend  Code Status:  Goals of Care: Advanced Directive information    07/09/2023   10:58 AM  Advanced Directives  Does Patient Have a Medical Advance Directive? Yes  Type of Advance Directive Living will;Healthcare Power of Attorney  Does patient want to make changes to medical advance directive? No - Patient declined  Copy of Healthcare Power of Attorney in Chart? Yes - validated most recent copy scanned in chart (See row information)      No chief complaint on file.   HPI: Patient is a 89 y.o. male seen today for admission to [facility].  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  He is admitted here in preparation for pilonidal surgery. He has a history of alpha gal syndrome from [facility] Star tick bite.  He does have some peripheral neuropathy that he associates with alpha gal but there may be some other etiology such as cervical disc disease or carpal tunnel.  He has a history of A-fib and takes metoprolol and Eliquis for that.  There is also a history of prostate cancer that was treated with seeding radiation. He is a widower x 7 years.  He tells me he is independent in all ADLs.  Past Medical History:   Diagnosis Date   Acute diastolic CHF (congestive heart failure) (HCC) 10/25/2021   Anxiety    Aortic insufficiency    Atrial fibrillation (HCC)    Chronic diastolic CHF (congestive heart failure) (HCC)    Chronic kidney disease, stage 3a (HCC)    Diverticulitis    Dyslipidemia    Hypertension    Inferior mesenteric vein thrombosis (HCC)    Mitral regurgitation    Nonrheumatic mitral valve regurgitation 10/28/2021   Paroxysmal atrial fibrillation (HCC) 01/12/2018   Persistent atrial fibrillation (HCC)    Prostate cancer [facility])    Past Surgical History:  Procedure Laterality Date   CARDIOVERSION N/A 03/01/2018   Procedure: CARDIOVERSION;  Surgeon: Lennette Bihari, MD;  Location: [facility] ENDOSCOPY;  Service: Cardiovascular;  Laterality: N/A;   CARDIOVERSION N/A 08/26/2021   Procedure: CARDIOVERSION;  Surgeon: Chrystie Nose, MD;  Location: [facility] ENDOSCOPY;  Service: Cardiovascular;  Laterality: N/A;   CARDIOVERSION N/A 12/06/2021   Procedure: CARDIOVERSION;  Surgeon: Little Ishikawa, MD;  Location: [facility] ENDOSCOPY;  Service: Cardiovascular;  Laterality: N/A;   CATARACT EXTRACTION  01/27/2010   right   CATARACT EXTRACTION  03/04/2010   left   Prostate seed implants     PROSTATE SURGERY      reports that he has never smoked. He has never used smokeless tobacco. He reports that he does not drink alcohol and does not use drugs. Social History   Socioeconomic History   Marital status: Widowed    Spouse name: Not on file  Number of children: Not on file   Years of education: Not on file   Highest education level: Not on file  Occupational History   Occupation: Professor---RETIRED    Employer: RETIRED    Comment: Retired  Tobacco Use   Smoking status: Never   Smokeless tobacco: Never  Vaping Use   Vaping status: Never Used  Substance and Sexual Activity   Alcohol use: No    Comment: Little wine occasionally    Drug use: No   Sexual activity: Never    Partners: Female  Other  Topics Concern   Not on file  Social History Narrative   Not on file   Social Determinants of Health   Financial Resource Strain: Low Risk  (02/17/2022)   Overall Financial Resource Strain (CARDIA)    Difficulty of Paying Living Expenses: Not hard at all  Food Insecurity: No Food Insecurity (06/01/2023)   Hunger Vital Sign    Worried About Running Out of Food in the Last Year: Never true    Ran Out of Food in the Last Year: Never true  Transportation Needs: No Transportation Needs (06/01/2023)   PRAPARE - Administrator, Civil Service (Medical): No    Lack of Transportation (Non-Medical): No  Physical Activity: Insufficiently Active (07/04/2023)   Exercise Vital Sign    Days of Exercise per Week: 1 day    Minutes of Exercise per Session: 10 min  Stress: Stress Concern Present (07/04/2023)   [employer] of Occupational Health - Occupational Stress Questionnaire    Feeling of Stress : To some extent  Social Connections: Moderately Integrated (09/26/2022)   Social Connection and Isolation Panel [NHANES]    Frequency of Communication with Friends and Family: Twice a week    Frequency of Social Gatherings with Friends and Family: Twice a week    Attends Religious Services: More than 4 times per year    Active Member of [employer] or Organizations: Yes    Attends Banker Meetings: Never    Marital Status: Widowed  Recent Concern: Social Connections - Moderately Isolated (08/29/2022)   Social Connection and Isolation Panel [NHANES]    Frequency of Communication with Friends and Family: Three times a week    Frequency of Social Gatherings with Friends and Family: Twice a week    Attends Religious Services: More than 4 times per year    Active Member of [employer] or Organizations: No    Attends Banker Meetings: Never    Marital Status: Widowed  Intimate Partner Violence: Not At Risk (05/26/2023)   Humiliation, Afraid, Rape, and Kick questionnaire    Fear of  Current or Ex-Partner: No    Emotionally Abused: No    Physically Abused: No    Sexually Abused: No    Functional Status Survey:    Family History  Problem Relation Age of Onset   Dementia Mother    Alzheimer's disease Mother    Mental illness Mother        ALZ DISEASE   Cancer Brother 17       pancreatic cancer    Health Maintenance  Topic Date Due   COVID-19 Vaccine (8 - 2023-24 season) 11/23/2022   INFLUENZA VACCINE  07/26/2023   Medicare Annual Wellness (AWV)  02/20/2024   DTaP/Tdap/Td (3 - Td or Tdap) 12/01/2032   Pneumonia Vaccine 44+ Years old  Completed   Zoster Vaccines- Shingrix  Completed   HPV VACCINES  Aged Out    Allergies  Allergen Reactions   Alpha-Gal Other (See Comments)    Avoidance of meat, dairy products, and other products that contain alpha-gal. Individuals diagnosed with AGS should completely avoid the consumption of mammalian meat, including beef, pork, lamb, venison, goat, and rabbit.    Sulfonamide Derivatives Other (See Comments)    Unknown - patient cannot remember the reaction    Outpatient Encounter Medications as of 07/10/2023  Medication Sig   acetaminophen (TYLENOL) 325 MG tablet Take 2 tablets (650 mg total) by mouth every 6 (six) hours as needed for mild pain or moderate pain (or Fever >/= 101).   apixaban (ELIQUIS) 5 MG TABS tablet Take 1 tablet (5 mg total) by mouth 2 (two) times daily.   b complex vitamins tablet Take 1 tablet by mouth daily.   Calcium Carbonate-Vitamin D 600-400 MG-UNIT tablet Take 1 tablet by mouth daily.   clorazepate (TRANXENE) 7.5 MG tablet Take 1 tablet (7.5 mg total) by mouth 2 (two) times daily as needed for anxiety.   cromolyn (NASALCROM) 5.2 MG/ACT nasal spray Place 2 sprays into both nostrils at bedtime.   empagliflozin (JARDIANCE) 10 MG TABS tablet Take 1 tablet (10 mg total) by mouth daily.   fenofibrate 160 MG tablet Take 1 tablet (160 mg total) by mouth daily.   fish oil-omega-3 fatty acids 1000 MG  capsule Take 1 g by mouth 2 (two) times daily.   gabapentin (NEURONTIN) 100 MG capsule Take 2 capsules (200 mg total) by mouth at bedtime.   latanoprost (XALATAN) 0.005 % ophthalmic solution Place 1 drop into the left eye at bedtime.   magic mouthwash w/lidocaine SOLN Take 5 mLs by mouth 4 (four) times daily as needed for mouth pain. Swish and Spit   metoprolol succinate (TOPROL XL) 25 MG 24 hr tablet Take 0.5 tablets (12.5 mg total) by mouth daily.   mirtazapine (REMERON) 7.5 MG tablet Take 1 tablet (7.5 mg total) by mouth at bedtime.   Multiple Vitamin (THEREMS PO) Take 1 tablet by mouth daily.   Multiple Vitamins-Minerals (PRESERVISION AREDS 2) CAPS 2 po qd   simvastatin (ZOCOR) 10 MG tablet Take 1 tablet (10 mg total) by mouth at bedtime.   spironolactone (ALDACTONE) 25 MG tablet Take 1/2 tablet (12.5 mg total) by mouth daily.   tamsulosin (FLOMAX) 0.4 MG CAPS capsule Take 1 capsule (0.4 mg total) by mouth daily.   torsemide (DEMADEX) 10 MG tablet Take 1 tablet (10 mg total) by mouth daily.   torsemide (DEMADEX) 20 MG tablet Take 1 tablet (20 mg total) by mouth daily. In case of weight gain 2 to 3 lbs in 24 hr or 5 lbs in 7 days take 2 tablets daily until weight back to baseline.   vitamin C (ASCORBIC ACID) 500 MG tablet Take 500 mg by mouth daily.   No facility-administered encounter medications on file as of 07/10/2023.    Review of Systems  Constitutional: Negative.   HENT: Negative.    Respiratory: Negative.    Cardiovascular: Negative.   Genitourinary: Negative.   Neurological: Negative.   Psychiatric/Behavioral: Negative.      There were no vitals filed for this visit. There is no height or weight on file to calculate BMI. Physical Exam Vitals and nursing note reviewed.  Constitutional:      Appearance: Normal appearance.  Eyes:     Extraocular Movements: Extraocular movements intact.     Pupils: Pupils are equal, round, and reactive to light.  Cardiovascular:     Rate  and  Rhythm: Normal rate. Rhythm irregular.  Pulmonary:     Effort: Pulmonary effort is normal.     Breath sounds: Normal breath sounds.  Abdominal:     General: Bowel sounds are normal.     Palpations: Abdomen is soft.  Musculoskeletal:        General: Normal range of motion.  Neurological:     General: No focal deficit present.     Mental Status: He is alert and oriented to person, place, and time.  Psychiatric:        Mood and Affect: Mood normal.        Behavior: Behavior normal.     Labs reviewed: Basic Metabolic Panel: Recent Labs    08/23/22 0312 08/24/22 0601 08/25/22 0619 08/31/22 1120 05/27/23 0206 05/28/23 0251 05/29/23 0337 06/06/23 1526 07/03/23 0000  NA 140 137 137   < > 128* 132* 132* 130* 131*  K 4.8 3.7 3.6   < > 4.0 4.0 4.3 4.9 4.7  CL 107 101 101   < > 93* 94* 97* 91* 96*  CO2 24 28 30    < > 25 28 26 29  28*  GLUCOSE 115* 92 94   < > 104* 111* 108* 90  --   BUN 20 26* 32*   < > 34* 41* 45* 48* 35*  CREATININE 1.06 1.24 1.12   < > 1.24 1.26* 1.23 1.78* 1.4*  CALCIUM 9.1 9.1 8.5*   < > 8.4* 8.7* 8.7* 9.2 8.6*  MG 1.9 2.0 2.0  --  2.1  --   --   --   --   PHOS 4.1  --   --   --   --   --   --   --   --    < > = values in this interval not displayed.   Liver Function Tests: Recent Labs    05/25/23 1217 05/25/23 1853 06/06/23 1526 07/03/23 0000  AST 29 37 26 24  ALT 23 27 19 14   ALKPHOS 54 55 56 66  BILITOT 1.3* 1.3* 1.2  --   PROT 6.1 6.3* 6.8  --   ALBUMIN 3.6 3.4* 3.8 3.4*   No results for input(s): "LIPASE", "AMYLASE" in the last 8760 hours. No results for input(s): "AMMONIA" in the last 8760 hours. CBC: Recent Labs    05/25/23 1217 05/25/23 1853 05/26/23 0820 05/27/23 0206 05/28/23 0251 06/06/23 1526 07/03/23 0000  WBC 9.8 11.6*   < > 8.8 11.6* 8.7 7.5  NEUTROABS 7.2 9.3*  --   --   --   --  4,275.00  HGB 14.0 14.4   < > 13.6 14.2 14.1 14.7  HCT 42.8 41.9   < > 39.9 41.8 42.7 44  MCV 92.8 89.1   < > 90.7 90.5 93.1  --   PLT  297.0 278   < > 251 262 244.0 249   < > = values in this interval not displayed.   Cardiac Enzymes: Recent Labs    08/22/22 1853 08/23/22 0312 11/24/22 1226  CKTOTAL 203 130 251   BNP: Invalid input(s): "POCBNP" Lab Results  Component Value Date   HGBA1C 5.0 05/21/2012   Lab Results  Component Value Date   TSH 1.498 05/25/2023   Lab Results  Component Value Date   VITAMINB12 484 06/06/2023   No results found for: "FOLATE" Lab Results  Component Value Date   IRON 118 09/05/2021   TIBC 355.6 09/05/2021   FERRITIN 135.9 09/05/2021  Imaging and Procedures obtained prior to SNF admission: ECHOCARDIOGRAM COMPLETE  Result Date: 05/26/2023    ECHOCARDIOGRAM REPORT   Patient Name:   Maxten Shuler. Date of Exam: 05/26/2023 Medical Rec #:  [id number]       Height:       66.0 in Accession #:    [id number]      Weight:       146.4 lb Date of Birth:  1928-09-02        BSA:          1.751 m Patient Age:    89 years        BP:           90/70 mmHg Patient Gender: M               HR:           109 bpm. Exam Location:  Inpatient Procedure: 2D Echo, Color Doppler and Cardiac Doppler Indications:    dyspnea  History:        Patient has prior history of Echocardiogram examinations, most                 recent 08/23/2022. CHF, chronic kidney disease, Arrythmias:Atrial                 Fibrillation, Signs/Symptoms:Edema; Risk Factors:Hypertension                 and Dyslipidemia.  Sonographer:    Delcie Roch RDCS Referring Phys: [id number] MAURICIO DANIEL ARRIEN IMPRESSIONS  1. Left ventricular ejection fraction, by estimation, is 70 to 75%. The left ventricle has hyperdynamic function. The left ventricle has no regional wall motion abnormalities. Left ventricular diastolic parameters are indeterminate.  2. Right ventricular systolic function is normal. The right ventricular size is normal. There is mildly elevated pulmonary artery systolic pressure. The estimated right ventricular systolic pressure is  43.4 mmHg.  3. Left atrial size was severely dilated.  4. Right atrial size was moderately dilated.  5. The mitral valve is normal in structure. Severe mitral valve regurgitation. No evidence of mitral stenosis.  6. The aortic valve is calcified. There is moderate calcification of the aortic valve. There is moderate thickening of the aortic valve. Aortic valve regurgitation is moderate. Aortic valve sclerosis/calcification is present, without any evidence of aortic stenosis. Aortic valve mean gradient measures 6.4 mmHg. Aortic valve Vmax measures 1.69 m/s.  7. The inferior vena cava is dilated in size with >50% respiratory variability, suggesting right atrial pressure of 8 mmHg. Comparison(s): No significant change from prior study. Prior images reviewed side by side. FINDINGS  Left Ventricle: Left ventricular ejection fraction, by estimation, is 70 to 75%. The left ventricle has hyperdynamic function. The left ventricle has no regional wall motion abnormalities. The left ventricular internal cavity size was normal in size. There is no left ventricular hypertrophy. Left ventricular diastolic parameters are indeterminate. Right Ventricle: The right ventricular size is normal. No increase in right ventricular wall thickness. Right ventricular systolic function is normal. There is mildly elevated pulmonary artery systolic pressure. The tricuspid regurgitant velocity is 3.18  m/s, and with an assumed right atrial pressure of 3 mmHg, the estimated right ventricular systolic pressure is 43.4 mmHg. Left Atrium: Left atrial size was severely dilated. Right Atrium: Right atrial size was moderately dilated. Pericardium: There is no evidence of pericardial effusion. Mitral Valve: The mitral valve is normal in structure. Mild mitral annular calcification. Severe mitral valve regurgitation. No evidence of  mitral valve stenosis. Tricuspid Valve: The tricuspid valve is normal in structure. Tricuspid valve regurgitation is mild .  No evidence of tricuspid stenosis. Aortic Valve: The aortic valve is calcified. There is moderate calcification of the aortic valve. There is moderate thickening of the aortic valve. Aortic valve regurgitation is moderate. Aortic valve sclerosis/calcification is present, without any evidence of aortic stenosis. Aortic valve mean gradient measures 6.4 mmHg. Aortic valve peak gradient measures 11.5 mmHg. Aortic valve area, by VTI measures 1.50 cm. Pulmonic Valve: The pulmonic valve was normal in structure. Pulmonic valve regurgitation is trivial. No evidence of pulmonic stenosis. Aorta: The aortic root is normal in size and structure. Venous: The inferior vena cava is dilated in size with greater than 50% respiratory variability, suggesting right atrial pressure of 8 mmHg. IAS/Shunts: No atrial level shunt detected by color flow Doppler.  LEFT VENTRICLE PLAX 2D LVIDd:         4.60 cm LVIDs:         2.40 cm LV PW:         1.00 cm LV IVS:        1.00 cm LVOT diam:     1.90 cm LV SV:         39 LV SV Index:   22 LVOT Area:     2.84 cm  RIGHT VENTRICLE             IVC RV Basal diam:  3.40 cm     IVC diam: 2.20 cm RV S prime:     11.40 cm/s TAPSE (M-mode): 1.5 cm LEFT ATRIUM              Index        RIGHT ATRIUM           Index LA diam:        5.30 cm  3.03 cm/m   RA Area:     24.50 cm LA Vol (A2C):   114.0 ml 65.09 ml/m  RA Volume:   76.00 ml  43.39 ml/m LA Vol (A4C):   117.0 ml 66.80 ml/m LA Biplane Vol: 122.0 ml 69.66 ml/m  AORTIC VALVE AV Area (Vmax):    1.41 cm AV Area (Vmean):   1.34 cm AV Area (VTI):     1.50 cm AV Vmax:           169.40 cm/s AV Vmean:          117.400 cm/s AV VTI:            0.259 m AV Peak Grad:      11.5 mmHg AV Mean Grad:      6.4 mmHg LVOT Vmax:         84.22 cm/s LVOT Vmean:        55.300 cm/s LVOT VTI:          0.137 m LVOT/AV VTI ratio: 0.53  AORTA Ao Root diam: 3.30 cm Ao Asc diam:  3.70 cm MR Peak grad:    88.4 mmHg    TRICUSPID VALVE MR Mean grad:    58.0 mmHg    TR Peak grad:    40.4 mmHg MR Vmax:         470.00 cm/s  TR Vmax:        318.00 cm/s MR Vmean:        365.0 cm/s MR PISA:         3.08 cm     SHUNTS MR PISA Eff ROA: 25 mm  Systemic VTI:  0.14 m MR PISA Radius:  0.70 cm      Systemic Diam: 1.90 cm Donato Schultz MD Electronically signed by Donato Schultz MD Signature Date/Time: 05/26/2023/6:18:43 PM    Final    DG Chest 2 View  Result Date: 05/25/2023 CLINICAL DATA:  Extremity swelling EXAM: CHEST - 2 VIEW COMPARISON:  Chest x-ray dated May 21st 2024 FINDINGS: The heart size and mediastinal contours are within normal limits. Unchanged elevation of the right hemidiaphragm. Both lungs are clear. The visualized skeletal structures are unremarkable. IMPRESSION: No active cardiopulmonary disease. Electronically Signed   By: Allegra Lai M.D.   On: 05/25/2023 19:36   US Venous Img Lower Unilateral Right (DVT)  Result Date: 05/25/2023 CLINICAL DATA:  Right calf swelling and weeping for the past week. History of prostate cancer. Evaluate for DVT. EXAM: RIGHT LOWER EXTREMITY VENOUS DOPPLER ULTRASOUND TECHNIQUE: Gray-scale sonography with graded compression, as well as color Doppler and duplex ultrasound were performed to evaluate the lower extremity deep venous systems from the level of the common femoral vein and including the common femoral, femoral, profunda femoral, popliteal and calf veins including the posterior tibial, peroneal and gastrocnemius veins when visible. The superficial great saphenous vein was also interrogated. Spectral Doppler was utilized to evaluate flow at rest and with distal augmentation maneuvers in the common femoral, femoral and popliteal veins. COMPARISON:  None Available. FINDINGS: Contralateral Common Femoral Vein: Respiratory phasicity is normal and symmetric with the symptomatic side. No evidence of thrombus. Normal compressibility. Common Femoral Vein: No evidence of thrombus. Normal compressibility, respiratory phasicity and response to  augmentation. Saphenofemoral Junction: No evidence of thrombus. Normal compressibility and flow on color Doppler imaging. Profunda Femoral Vein: No evidence of thrombus. Normal compressibility and flow on color Doppler imaging. Femoral Vein: No evidence of thrombus. Normal compressibility, respiratory phasicity and response to augmentation. Popliteal Vein: No evidence of thrombus. Normal compressibility, respiratory phasicity and response to augmentation. Calf Veins: No evidence of thrombus. Normal compressibility and flow on color Doppler imaging. Superficial Great Saphenous Vein: No evidence of thrombus. Normal compressibility. Other Findings: Note is made of a mildly prominent though non pathologically enlarged right inguinal lymph node which measures 0.7 cm in diameter, presumably reactive in etiology. Sonographic evaluation of patient's area of discomfort involving the medial aspect of the right mid thigh correlates with an indeterminate 0.7 x 0.6 x 0.5 cm minimally complex subcutaneous cystic nodule (images 32 through 38). This nodule does not demonstrate communication with the overlying dermal surface. No blood flow is demonstrated within this nodule. This nodule does not appear to communicate with adjacent vascular structures (as confirmed on cine series 2) Note is made of a 4.4 x 3.2 x 1.0 cm minimally complex right-sided Baker's cyst. There is a moderate-to-large amount of subcutaneous edema at the level of the right lower leg and calf. IMPRESSION: 1. No evidence of DVT within right lower extremity. 2. Sonographic evaluation of the patient's area of discomfort involving medial aspect of the right mid thigh correlates with a 0.7 cm minimally complex subcutaneous cystic nodule, indeterminate and of doubtful clinical significance with differential considerations including a hematoma versus seroma or abscess. 3. Note made of a 4.4 cm minimally complex right-sided Baker's cyst. Electronically Signed   By: Simonne Come M.D.   On: 05/25/2023 13:29    Assessment/Plan 1. Acute on chronic diastolic CHF (congestive heart failure) (HCC) On Demadex and Jardiance.  Well compensated  2. Allergy to alpha-gal As noted above patient associates  his neuropathy type symptoms in his hands with alpha gal.  Could be related to cervical disc disease.  He plans to pursue further evaluation upon discharge  3. Chronic kidney disease, stage 3a (HCC) Currently stable.  Creatinine varies 1.2-1.4 generally  4. Essential hypertension Blood pressure medicine was adjusted since his admission here and is within normal limits  5. Hyperlipidemia, unspecified hyperlipidemia type Treated with statin fenofibrate  6. Paroxysmal atrial fibrillation (HCC) Rate control controlled with metoprolol  7. Idiopathic peripheral neuropathy Penton 200 mg seems to be effective.  Uncertain etiology  8. PROSTATE CANCER, HX OF Is post radioactive seed implantation   Family/ staff Communication:   Labs/tests ordered:  Bertram Millard. Hyacinth Meeker, MD [facility] [address] [place], [place] [address] Office [phone]

## 2023-07-12 ENCOUNTER — Ambulatory Visit (INDEPENDENT_AMBULATORY_CARE_PROVIDER_SITE_OTHER): Payer: Medicare Other | Admitting: Family Medicine

## 2023-07-12 ENCOUNTER — Encounter: Payer: Self-pay | Admitting: Family Medicine

## 2023-07-12 VITALS — BP 100/60 | HR 97 | Temp 98.1°F | Resp 18 | Ht 66.0 in | Wt 139.0 lb

## 2023-07-12 DIAGNOSIS — S40861A Insect bite (nonvenomous) of right upper arm, initial encounter: Secondary | ICD-10-CM

## 2023-07-12 DIAGNOSIS — I10 Essential (primary) hypertension: Secondary | ICD-10-CM

## 2023-07-12 DIAGNOSIS — N179 Acute kidney failure, unspecified: Secondary | ICD-10-CM

## 2023-07-12 DIAGNOSIS — F419 Anxiety disorder, unspecified: Secondary | ICD-10-CM

## 2023-07-12 DIAGNOSIS — W57XXXA Bitten or stung by nonvenomous insect and other nonvenomous arthropods, initial encounter: Secondary | ICD-10-CM

## 2023-07-12 DIAGNOSIS — E785 Hyperlipidemia, unspecified: Secondary | ICD-10-CM

## 2023-07-12 DIAGNOSIS — L0591 Pilonidal cyst without abscess: Secondary | ICD-10-CM

## 2023-07-12 DIAGNOSIS — I480 Paroxysmal atrial fibrillation: Secondary | ICD-10-CM | POA: Diagnosis not present

## 2023-07-12 DIAGNOSIS — L089 Local infection of the skin and subcutaneous tissue, unspecified: Secondary | ICD-10-CM | POA: Diagnosis not present

## 2023-07-12 DIAGNOSIS — R200 Anesthesia of skin: Secondary | ICD-10-CM | POA: Diagnosis not present

## 2023-07-12 DIAGNOSIS — E871 Hypo-osmolality and hyponatremia: Secondary | ICD-10-CM

## 2023-07-12 DIAGNOSIS — Z91018 Allergy to other foods: Secondary | ICD-10-CM

## 2023-07-12 DIAGNOSIS — I351 Nonrheumatic aortic (valve) insufficiency: Secondary | ICD-10-CM | POA: Diagnosis not present

## 2023-07-12 DIAGNOSIS — I5033 Acute on chronic diastolic (congestive) heart failure: Secondary | ICD-10-CM | POA: Diagnosis not present

## 2023-07-12 DIAGNOSIS — I5032 Chronic diastolic (congestive) heart failure: Secondary | ICD-10-CM | POA: Diagnosis not present

## 2023-07-12 DIAGNOSIS — N1831 Chronic kidney disease, stage 3a: Secondary | ICD-10-CM | POA: Diagnosis not present

## 2023-07-12 DIAGNOSIS — I5031 Acute diastolic (congestive) heart failure: Secondary | ICD-10-CM

## 2023-07-12 DIAGNOSIS — M79641 Pain in right hand: Secondary | ICD-10-CM

## 2023-07-12 LAB — LIPID PANEL
Cholesterol: 105 mg/dL (ref 0–200)
HDL: 48.6 mg/dL (ref >39.00)
LDL Cholesterol: 43 mg/dL (ref 0–99)
NonHDL: 56.86
Total CHOL/HDL Ratio: 2
Triglycerides: 67 mg/dL (ref 0.0–149.0)
VLDL: 13.4 mg/dL (ref 0.0–40.0)

## 2023-07-12 LAB — CBC WITH DIFFERENTIAL/PLATELET
Basophils Absolute: 0 10*3/uL (ref 0.0–0.1)
Basophils Relative: 0.6 % (ref 0.0–3.0)
Eosinophils Absolute: 0 10*3/uL (ref 0.0–0.7)
Eosinophils Relative: 0.7 % (ref 0.0–5.0)
HCT: 43.5 % (ref 39.0–52.0)
Hemoglobin: 14.7 g/dL (ref 13.0–17.0)
Lymphocytes Relative: 16.1 % (ref 12.0–46.0)
Lymphs Abs: 1.1 10*3/uL (ref 0.7–4.0)
MCHC: 33.8 g/dL (ref 30.0–36.0)
MCV: 93.4 fl (ref 78.0–100.0)
Monocytes Absolute: 1.3 10*3/uL — ABNORMAL HIGH (ref 0.1–1.0)
Monocytes Relative: 18.6 % — ABNORMAL HIGH (ref 3.0–12.0)
Neutro Abs: 4.3 10*3/uL (ref 1.4–7.7)
Neutrophils Relative %: 64 % (ref 43.0–77.0)
Platelets: 182 10*3/uL (ref 150.0–400.0)
RBC: 4.66 Mil/uL (ref 4.22–5.81)
RDW: 17.1 % — ABNORMAL HIGH (ref 11.5–15.5)
WBC: 6.7 10*3/uL (ref 4.0–10.5)

## 2023-07-12 LAB — COMPREHENSIVE METABOLIC PANEL
ALT: 17 U/L (ref 0–53)
AST: 29 U/L (ref 0–37)
Albumin: 3.8 g/dL (ref 3.5–5.2)
Alkaline Phosphatase: 60 U/L (ref 39–117)
BUN: 35 mg/dL — ABNORMAL HIGH (ref 6–23)
CO2: 29 mEq/L (ref 19–32)
Calcium: 9.2 mg/dL (ref 8.4–10.5)
Chloride: 94 mEq/L — ABNORMAL LOW (ref 96–112)
Creatinine, Ser: 1.56 mg/dL — ABNORMAL HIGH (ref 0.40–1.50)
GFR: 38.18 mL/min — ABNORMAL LOW (ref >60.00)
Glucose, Bld: 80 mg/dL (ref 70–99)
Potassium: 5.4 mEq/L — ABNORMAL HIGH (ref 3.5–5.1)
Sodium: 130 mEq/L — ABNORMAL LOW (ref 135–145)
Total Bilirubin: 1.4 mg/dL — ABNORMAL HIGH (ref 0.2–1.2)
Total Protein: 6.6 g/dL (ref 6.0–8.3)

## 2023-07-12 NOTE — Assessment & Plan Note (Signed)
Stable

## 2023-07-12 NOTE — Assessment & Plan Note (Signed)
On torsemide. 

## 2023-07-12 NOTE — Assessment & Plan Note (Signed)
Well controlled, no changes to meds. Encouraged heart healthy diet such as the DASH diet and exercise as tolerated.  °

## 2023-07-12 NOTE — Assessment & Plan Note (Signed)
Per nephrology 

## 2023-07-12 NOTE — Progress Notes (Signed)
Established Patient Office Visit  Subjective   Patient ID: Mark Stark., male    DOB: 1928/06/01  Age: 89 y.o. MRN: [id number]  Chief Complaint  Patient presents with   Follow-up    HPI Discussed the use of AI scribe software for clinical note transcription with the patient, who gave verbal consent to proceed.  History of Present Illness   The patient, with a history of heart disease and neuropathic pain, presents with multiple health concerns. He recently had a pilonidal cyst evaluated by a surgeon who determined that it was small, non-aggressive, and did not require surgery. The patient was advised to avoid bruising the area.  The patient also has a cyst on his knee, discovered during an ultrasound. He was unaware of its presence and it is not causing any discomfort.  The patient's neuropathic pain has been managed with gabapentin, taken twice at bedtime. This provides relief for about five to six hours, after which the pain returns.  The patient has dietary restrictions due to an alpha-gal allergy, likely caused by a tick bite. He is considering retesting for the allergy and potentially reintroducing restricted foods to his diet to see if they cause any adverse reactions.  Lastly, the patient has been experiencing issues with his hands. He has seen a hand specialist and has been using splints, but this has not provided any relief. He is considering seeing a neurologist for further evaluation.      Patient Active Problem List   Diagnosis Date Noted   Fall 06/29/2023   Allergy to alpha-gal 06/29/2023   Peripheral neuropathy 06/29/2023   Bilateral hand pain 06/11/2023   Insomnia 06/11/2023   Pilonidal cyst 06/11/2023   Allergic reaction to alpha-gal 06/11/2023   Numbness in both hands 06/01/2023   Hyponatremia 05/25/2023   Cellulitis of right lower extremity 05/22/2023   Tick bite of right axilla with infection 05/15/2023   SOB (shortness of breath) 05/15/2023   Hematoma of  left lower leg 01/05/2023   Acute on chronic diastolic CHF (congestive heart failure) (HCC) 08/24/2022   BPH (benign prostatic hyperplasia) 08/23/2022   Generalized weakness 08/22/2022   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   Diverticulitis 08/09/2022   Chronic kidney disease, stage 3a (HCC) 08/09/2022   Primary open angle glaucoma (POAG) of left eye, moderate stage 06/08/2022   Contusion of left orbit 01/05/2022   Chronic diastolic CHF (congestive heart failure) (HCC) 11/09/2021   Nonrheumatic mitral valve regurgitation 10/28/2021   Acute kidney injury superimposed on CKD (HCC) 10/28/2021   Acute diastolic CHF (congestive heart failure) (HCC) 10/25/2021   Secondary hypercoagulable state (HCC) 08/23/2021   Prostate CA (HCC) 07/28/2021   Thrombocytopenia (HCC) 03/30/2021   Screening for HIV (human immunodeficiency virus) 03/30/2021   Monocytosis 03/30/2021   Abnormal platelets (HCC) 03/28/2021   Hip pain 03/28/2021   Dry eyes, bilateral 05/27/2020   Epiretinal membrane (ERM) of both eyes 12/03/2019   Persistent atrial fibrillation (HCC)    Paroxysmal atrial fibrillation (HCC) 01/12/2018   Cystoid macular edema of right eye 11/29/2017   Macular degeneration 12/21/2015   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 eye with active choroidal neovascularization (HCC) 04/30/2014   Myalgia and myositis 11/16/2013   Epiretinal membrane 11/07/2012   Posterior vitreous detachment 11/07/2012   Status post intraocular lens implant 11/07/2012   Fungal ear infection 04/09/2012   Abnormal laboratory test 03/27/2011   Aortic insufficiency 03/27/2011   SYSTOLIC MURMUR 03/05/2011   EDEMA 12/05/2010   ECCHYMOSES,  SPONTANEOUS 11/28/2010   CONTUSION, RIGHT THIGH 11/28/2010   DECREASED HEARING, BILATERAL 09/23/2010   Pain in limb 07/28/2009   ANKLE SPRAIN, LEFT 07/28/2009   CONTUSION, LOWER LEG, LEFT 07/28/2009   DERMATITIS, CONTACT, NOS 10/02/2007   Essential hypertension  05/07/2007   PROSTATE CANCER, HX OF 03/13/2007   Hyperlipidemia 01/15/2007   Anxiety 01/15/2007   H/O oral aphthous ulcers 01/15/2007   Past Medical History:  Diagnosis Date   Acute diastolic CHF (congestive heart failure) (HCC) 10/25/2021   Anxiety    Aortic insufficiency    Atrial fibrillation (HCC)    Chronic diastolic CHF (congestive heart failure) (HCC)    Chronic kidney disease, stage 3a (HCC)    Diverticulitis    Dyslipidemia    Hypertension    Inferior mesenteric vein thrombosis (HCC)    Mitral regurgitation    Nonrheumatic mitral valve regurgitation 10/28/2021   Paroxysmal atrial fibrillation (HCC) 01/12/2018   Persistent atrial fibrillation (HCC)    Prostate cancer [facility])    Past Surgical History:  Procedure Laterality Date   CARDIOVERSION N/A 03/01/2018   Procedure: CARDIOVERSION;  Surgeon: Lennette Bihari, MD;  Location: [facility] ENDOSCOPY;  Service: Cardiovascular;  Laterality: N/A;   CARDIOVERSION N/A 08/26/2021   Procedure: CARDIOVERSION;  Surgeon: Chrystie Nose, MD;  Location: [facility] ENDOSCOPY;  Service: Cardiovascular;  Laterality: N/A;   CARDIOVERSION N/A 12/06/2021   Procedure: CARDIOVERSION;  Surgeon: Little Ishikawa, MD;  Location: [facility] ENDOSCOPY;  Service: Cardiovascular;  Laterality: N/A;   CATARACT EXTRACTION  01/27/2010   right   CATARACT EXTRACTION  03/04/2010   left   Prostate seed implants     PROSTATE SURGERY     Social History   Tobacco Use   Smoking status: Never   Smokeless tobacco: Never  Vaping Use   Vaping status: Never Used  Substance Use Topics   Alcohol use: No    Comment: Little wine occasionally    Drug use: No   Social History   Socioeconomic History   Marital status: Widowed    Spouse name: Not on file   Number of children: Not on file   Years of education: Not on file   Highest education level: Not on file  Occupational History   Occupation: Professor---RETIRED    Employer: RETIRED    Comment: Retired  Tobacco Use    Smoking status: Never   Smokeless tobacco: Never  Vaping Use   Vaping status: Never Used  Substance and Sexual Activity   Alcohol use: No    Comment: Little wine occasionally    Drug use: No   Sexual activity: Never    Partners: Female  Other Topics Concern   Not on file  Social History Narrative   Not on file   Social Determinants of Health   Financial Resource Strain: Low Risk  (02/17/2022)   Overall Financial Resource Strain (CARDIA)    Difficulty of Paying Living Expenses: Not hard at all  Food Insecurity: No Food Insecurity (06/01/2023)   Hunger Vital Sign    Worried About Running Out of Food in the Last Year: Never true    Ran Out of Food in the Last Year: Never true  Transportation Needs: No Transportation Needs (06/01/2023)   PRAPARE - Administrator, Civil Service (Medical): No    Lack of Transportation (Non-Medical): No  Physical Activity: Insufficiently Active (07/04/2023)   Exercise Vital Sign    Days of Exercise per Week: 1 day    Minutes of Exercise  per Session: 10 min  Stress: Stress Concern Present (07/04/2023)   [employer] of Occupational Health - Occupational Stress Questionnaire    Feeling of Stress : To some extent  Social Connections: Moderately Integrated (09/26/2022)   Social Connection and Isolation Panel [NHANES]    Frequency of Communication with Friends and Family: Twice a week    Frequency of Social Gatherings with Friends and Family: Twice a week    Attends Religious Services: More than 4 times per year    Active Member of [employer] or Organizations: Yes    Attends Banker Meetings: Never    Marital Status: Widowed  Recent Concern: Social Connections - Moderately Isolated (08/29/2022)   Social Connection and Isolation Panel [NHANES]    Frequency of Communication with Friends and Family: Three times a week    Frequency of Social Gatherings with Friends and Family: Twice a week    Attends Religious Services: More than 4 times  per year    Active Member of [employer] or Organizations: No    Attends Banker Meetings: Never    Marital Status: Widowed  Intimate Partner Violence: Not At Risk (05/26/2023)   Humiliation, Afraid, Rape, and Kick questionnaire    Fear of Current or Ex-Partner: No    Emotionally Abused: No    Physically Abused: No    Sexually Abused: No   Family Status  Relation Name Status   Mother  Deceased   Father  Deceased   Brother  Deceased   MGM  Deceased   MGF  Deceased   PGM  Deceased   PGF  Deceased  No partnership data on file   Family History  Problem Relation Age of Onset   Dementia Mother    Alzheimer's disease Mother    Mental illness Mother        ALZ DISEASE   Cancer Brother 30       pancreatic cancer   Allergies  Allergen Reactions   Alpha-Gal Other (See Comments)    Avoidance of meat, dairy products, and other products that contain alpha-gal. Individuals diagnosed with AGS should completely avoid the consumption of mammalian meat, including beef, pork, lamb, venison, goat, and rabbit.    Sulfonamide Derivatives Other (See Comments)    Unknown - patient cannot remember the reaction      Review of Systems  Constitutional:  Negative for fever and malaise/fatigue.  HENT:  Negative for congestion.   Eyes:  Negative for blurred vision.  Respiratory:  Negative for shortness of breath.   Cardiovascular:  Negative for chest pain, palpitations and leg swelling.  Gastrointestinal:  Negative for abdominal pain, blood in stool and nausea.  Genitourinary:  Negative for dysuria and frequency.  Musculoskeletal:  Negative for falls.  Skin:  Negative for rash.  Neurological:  Negative for dizziness, loss of consciousness and headaches.  Endo/Heme/Allergies:  Negative for environmental allergies.  Psychiatric/Behavioral:  Negative for depression. The patient is not nervous/anxious.       Objective:     BP 100/60 (BP Location: Left Arm, Patient Position: Sitting, Cuff  Size: Normal)   Pulse 97   Temp 98.1 F (36.7 C) (Oral)   Resp 18   Ht 5\' 6"  (1.676 m)   Wt 139 lb (63 kg)   SpO2 98%   BMI 22.44 kg/m  BP Readings from Last 3 Encounters:  07/12/23 100/60  07/10/23 120/70  07/09/23 120/70   Wt Readings from Last 3 Encounters:  07/12/23 139 lb (63 kg)  07/09/23 141 lb 4.8 oz (64.1 kg)  07/02/23 141 lb 3.2 oz (64 kg)   SpO2 Readings from Last 3 Encounters:  07/12/23 98%  07/09/23 96%  07/02/23 99%      Physical Exam Vitals and nursing note reviewed.  Constitutional:      General: He is not in acute distress.    Appearance: Normal appearance. He is well-developed.  HENT:     Head: Normocephalic and atraumatic.  Eyes:     General: No scleral icterus.       Right eye: No discharge.        Left eye: No discharge.  Cardiovascular:     Rate and Rhythm: Normal rate and regular rhythm.     Heart sounds: No murmur heard. Pulmonary:     Effort: Pulmonary effort is normal. No respiratory distress.     Breath sounds: Normal breath sounds.  Musculoskeletal:        General: Normal range of motion.     Cervical back: Normal range of motion and neck supple.     Right lower leg: No edema.     Left lower leg: No edema.  Skin:    General: Skin is warm and dry.  Neurological:     General: No focal deficit present.     Mental Status: He is alert and oriented to person, place, and time.  Psychiatric:        Mood and Affect: Mood normal.        Behavior: Behavior normal.        Thought Content: Thought content normal.        Judgment: Judgment normal.      No results found for any visits on 07/12/23.  Last CBC Lab Results  Component Value Date   WBC 7.5 07/03/2023   HGB 14.7 07/03/2023   HCT 44 07/03/2023   MCV 93.1 06/06/2023   MCH 30.7 05/28/2023   RDW 14.9 06/06/2023   PLT 249 07/03/2023   Last metabolic panel Lab Results  Component Value Date   GLUCOSE 90 06/06/2023   NA 131 (A) 07/03/2023   K 4.7 07/03/2023   CL 96 (A)  07/03/2023   CO2 28 (A) 07/03/2023   BUN 35 (A) 07/03/2023   CREATININE 1.4 (A) 07/03/2023   GFR 32.61 (L) 06/06/2023   CALCIUM 8.6 (A) 07/03/2023   PHOS 4.1 08/23/2022   PROT 6.8 06/06/2023   ALBUMIN 3.4 (A) 07/03/2023   BILITOT 1.2 06/06/2023   ALKPHOS 66 07/03/2023   AST 24 07/03/2023   ALT 14 07/03/2023   ANIONGAP 9 05/29/2023   Last lipids Lab Results  Component Value Date   CHOL 123 02/09/2023   HDL 48 02/09/2023   LDLCALC 57 02/09/2023   TRIG 93 02/09/2023   CHOLHDL 2.6 02/09/2023   Last hemoglobin A1c Lab Results  Component Value Date   HGBA1C 5.0 05/21/2012   Last thyroid functions Lab Results  Component Value Date   TSH 1.498 05/25/2023   Last vitamin D No results found for: "25OHVITD2", "25OHVITD3", "VD25OH" Last vitamin B12 and Folate Lab Results  Component Value Date   VITAMINB12 484 06/06/2023      The ASCVD Risk score (Arnett DK, et al., 2019) failed to calculate for the following reasons:   The 2019 ASCVD risk score is only valid for ages 31 to 6    Assessment & Plan:   Problem List Items Addressed This Visit       Unprioritized   Acute kidney  injury superimposed on CKD (HCC)   Tick bite of right axilla with infection - Primary   Relevant Orders   Alpha-Gal Panel   Lipid panel   CBC with Differential/Platelet   Comprehensive metabolic panel   Acute on chronic diastolic CHF (congestive heart failure) (HCC)   Pilonidal cyst   Numbness in both hands   Bilateral hand pain   Anxiety   Allergy to alpha-gal   Hyponatremia   Relevant Orders   Lipid panel   CBC with Differential/Platelet   Comprehensive metabolic panel   Chronic diastolic CHF (congestive heart failure) (HCC)   Paroxysmal atrial fibrillation (HCC)    Stable       Hyperlipidemia    Encourage heart healthy diet such as MIND or DASH diet, increase exercise, avoid trans fats, simple carbohydrates and processed foods, consider a krill or fish or flaxseed oil cap daily.         Relevant Orders   Lipid panel   CBC with Differential/Platelet   Comprehensive metabolic panel   Essential hypertension    Well controlled, no changes to meds. Encouraged heart healthy diet such as the DASH diet and exercise as tolerated.        Chronic kidney disease, stage 3a [facility])    Per nephrology      Aortic insufficiency    Per cardiology      Acute diastolic CHF (congestive heart failure) (HCC)    On torsemide        Return in about 3 months (around 10/12/2023), or if symptoms worsen or fail to improve.    Donato Schultz, DO

## 2023-07-12 NOTE — Assessment & Plan Note (Signed)
Per cardiology 

## 2023-07-12 NOTE — Patient Instructions (Signed)
Tick Bite Information, Adult  Ticks are insects that draw blood for food. They climb onto people and animals that brush against the leaves and grasses that they live in. They then bite and attach to the skin. Most ticks are harmless, but some ticks may carry germs that can cause disease. These germs are spread to a person through a bite. To lower your risk of getting a disease from a tick bite, make sure you: Take steps to prevent tick bites. Check for ticks after being outdoors where ticks live. Watch for symptoms of disease if a tick attached to you or if you think a tick bit you. How can I prevent tick bites? Take these steps to help prevent tick bites when you go outdoors in an area where ticks live: Before you go outdoors: Wear long sleeves and long pants to protect your skin from ticks. Wear light-colored clothing so you can see ticks easier. Tuck your pant legs into your socks. Apply insect repellent that has DEET (20% or higher), picaridin, or IR3535 in it to the following areas: Any bare skin. Avoid areas around the eyes and mouth. Edges of clothing, like the top of your boots, the bottom of your pant legs, and your sleeve cuffs. Consider applying an insect repellant that contains permethrin. Follow the instructions on the label. Do not apply permethrin directly to the skin. Instead, apply to the following areas: Clothing and shoes. Outdoor gear and tents. When you are outdoors: Avoid walking through areas with long grass. If you are walking on a trail, stay in the middle of the trail so your skin, hair, and clothing do not touch the bushes. Check for ticks on your clothing, hair, and skin often while you are outdoors. Check again before you go inside. When you go indoors: Check your clothing for ticks. Tumble dry clothes in a dryer on high heat for at least 10 minutes. If clothes are damp, additional time may be needed. If clothes require washing, use hot water. Check your gear and  pets. Shower soon after being outdoors. Check your body for ticks. Do a full body check using a mirror. Be sure to check your scalp, neck, armpits, waist, groin, and joint areas. These are the spots where ticks attach themselves most often. What is the best way to remove a tick?  Remove the tick as soon as possible. Removing it can prevent germs from passing to your body. Do not remove the tick with your bare fingers. Do not try to remove a tick with heat, alcohol, petroleum jelly, or fingernail polish. These things can cause the tick to salivate and regurgitate into your bloodstream, increasing your risk of getting a disease. To remove a tick that is crawling on your skin: Go outside and brush the tick off. Use tape or a lint roller. To remove a tick that is attached to your skin: Wash your hands. If you have gloves, put them on. Use a fine-tipped tweezer, curved forceps, or a tick-removal tool to gently grasp the tick as close to your skin and the tick's head as possible. Gently pull with a steady, upward, and even pressure until the tick lets go. While removing the tick: Take care to keep the tick's head attached to its body. Do not twist or jerk the tick. This can make the tick's head or mouth parts break off and stay in your skin. If this happens, try to remove the mouth parts with tweezers. If you cannot remove them, leave   the area alone and let the skin heal. Do not squeeze or crush the tick's body. This could force disease-carrying fluids from the tick into your body. What should I do after removing a tick? Clean the bite area and your hands with soap and water, rubbing alcohol, or an iodine scrub. If an antiseptic cream or ointment is available, put a small amount on the bite area. Wash and disinfect any tools that you used to remove the tick. How should I dispose of a tick? To dispose of a live tick, use one of these methods: Place it in rubbing alcohol. Place it in a sealed bag  or container, and throw it away. Wrap it tightly in tape, and throw it away. Flush it down the toilet. Where to find more information Centers for Disease Control and Prevention: cdc.gov/ticks [place] Environmental Protection Agency: epa.gov/insect-repellents Contact a health care provider if: You have symptoms of a disease after a tick bite. Symptoms of a tick-borne disease can occur from moments after the tick bites to 30 days after a tick is removed. Symptoms include: Fever or chills. A red rash that makes a circle (bull's-eye rash) in the bite area. Redness and swelling in the bite area. Headache or stiff neck. Muscle, joint, or bone pain. Abnormal tiredness. Numbness in your legs or trouble walking or moving your legs. Tender or swollen lymph glands. Abdominal pain, vomiting, diarrhea, or weight loss. Get help right away if: You are not able to remove a tick. You have muscle weakness or paralysis. Your symptoms get worse or you experience new symptoms. You find an engorged tick on your skin and you are in an area where there is a higher risk of disease from ticks. Summary Ticks may carry germs that can spread to a person through a bite. These germs can cause disease. Wear protective clothing and use insect repellent to prevent tick bites. Follow the instructions on the label. If you find a tick on your body, remove it as soon as possible. If the tick is attached, do not try to remove it with heat, alcohol, petroleum jelly, or fingernail polish. If you have symptoms of a disease after being bitten by a tick, contact a health care provider. This information is not intended to replace advice given to you by your health care provider. Make sure you discuss any questions you have with your health care provider. Document Revised: 03/13/2022 Document Reviewed: 03/13/2022 Elsevier Patient Education  2024 [employer]  

## 2023-07-12 NOTE — Assessment & Plan Note (Signed)
Encourage heart healthy diet such as MIND or DASH diet, increase exercise, avoid trans fats, simple carbohydrates and processed foods, consider a krill or fish or flaxseed oil cap daily.  °

## 2023-07-13 LAB — ALPHA-GAL PANEL
Allergen, Mutton, f88: 0.21 kU/L — ABNORMAL HIGH
Allergen, Pork, f26: 0.14 kU/L — ABNORMAL HIGH

## 2023-07-15 ENCOUNTER — Encounter (HOSPITAL_BASED_OUTPATIENT_CLINIC_OR_DEPARTMENT_OTHER): Payer: Self-pay

## 2023-07-15 ENCOUNTER — Other Ambulatory Visit: Payer: Self-pay

## 2023-07-15 ENCOUNTER — Emergency Department (HOSPITAL_BASED_OUTPATIENT_CLINIC_OR_DEPARTMENT_OTHER)
Admission: EM | Admit: 2023-07-15 | Discharge: 2023-07-15 | Disposition: A | Discharge status: Home / Self Care | Payer: Medicare Other | Attending: Emergency Medicine | Admitting: Emergency Medicine

## 2023-07-15 DIAGNOSIS — I959 Hypotension, unspecified: Secondary | ICD-10-CM | POA: Insufficient documentation

## 2023-07-15 DIAGNOSIS — I130 Hypertensive heart and chronic kidney disease with heart failure and stage 1 through stage 4 chronic kidney disease, or unspecified chronic kidney disease: Secondary | ICD-10-CM | POA: Diagnosis not present

## 2023-07-15 DIAGNOSIS — W01198A Fall on same level from slipping, tripping and stumbling with subsequent striking against other object, initial encounter: Secondary | ICD-10-CM | POA: Diagnosis not present

## 2023-07-15 DIAGNOSIS — S59902A Unspecified injury of left elbow, initial encounter: Secondary | ICD-10-CM | POA: Diagnosis not present

## 2023-07-15 DIAGNOSIS — Z8546 Personal history of malignant neoplasm of prostate: Secondary | ICD-10-CM | POA: Diagnosis not present

## 2023-07-15 DIAGNOSIS — I5033 Acute on chronic diastolic (congestive) heart failure: Secondary | ICD-10-CM | POA: Insufficient documentation

## 2023-07-15 DIAGNOSIS — Z79899 Other long term (current) drug therapy: Secondary | ICD-10-CM | POA: Insufficient documentation

## 2023-07-15 DIAGNOSIS — Z7901 Long term (current) use of anticoagulants: Secondary | ICD-10-CM | POA: Diagnosis not present

## 2023-07-15 DIAGNOSIS — W19XXXA Unspecified fall, initial encounter: Secondary | ICD-10-CM

## 2023-07-15 DIAGNOSIS — S51012A Laceration without foreign body of left elbow, initial encounter: Secondary | ICD-10-CM | POA: Diagnosis not present

## 2023-07-15 DIAGNOSIS — N183 Chronic kidney disease, stage 3 unspecified: Secondary | ICD-10-CM | POA: Insufficient documentation

## 2023-07-15 LAB — URINALYSIS, W/ REFLEX TO CULTURE (INFECTION SUSPECTED)
Bilirubin Urine: NEGATIVE
Glucose, UA: >=500 mg/dL — AB
Hgb urine dipstick: NEGATIVE
Ketones, ur: NEGATIVE mg/dL
Leukocytes,Ua: NEGATIVE
Nitrite: NEGATIVE
Protein, ur: NEGATIVE mg/dL
Specific Gravity, Urine: 1.02 (ref 1.005–1.030)
pH: 8.5 — ABNORMAL HIGH (ref 5.0–8.0)

## 2023-07-15 LAB — CBC WITH DIFFERENTIAL/PLATELET
Abs Immature Granulocytes: 0.05 K/uL (ref 0.00–0.07)
Basophils Absolute: 0 K/uL (ref 0.0–0.1)
Basophils Relative: 1 %
Eosinophils Absolute: 0.1 K/uL (ref 0.0–0.5)
Eosinophils Relative: 1 %
HCT: 41.5 % (ref 39.0–52.0)
Hemoglobin: 13.8 g/dL (ref 13.0–17.0)
Immature Granulocytes: 1 %
Lymphocytes Relative: 22 %
Lymphs Abs: 1.2 K/uL (ref 0.7–4.0)
MCH: 31.2 pg (ref 26.0–34.0)
MCHC: 33.3 g/dL (ref 30.0–36.0)
MCV: 93.9 fL (ref 80.0–100.0)
Monocytes Absolute: 0.8 K/uL (ref 0.1–1.0)
Monocytes Relative: 15 %
Neutro Abs: 3.3 K/uL (ref 1.7–7.7)
Neutrophils Relative %: 60 %
Platelets: 168 K/uL (ref 150–400)
RBC: 4.42 MIL/uL (ref 4.22–5.81)
RDW: 16.2 % — ABNORMAL HIGH (ref 11.5–15.5)
WBC: 5.4 K/uL (ref 4.0–10.5)
nRBC: 0 % (ref 0.0–0.2)

## 2023-07-15 LAB — COMPREHENSIVE METABOLIC PANEL
ALT: 17 U/L (ref 0–44)
AST: 28 U/L (ref 15–41)
Albumin: 3.2 g/dL — ABNORMAL LOW (ref 3.5–5.0)
Alkaline Phosphatase: 63 U/L (ref 38–126)
Anion gap: 11 (ref 5–15)
BUN: 34 mg/dL — ABNORMAL HIGH (ref 8–23)
CO2: 25 mmol/L (ref 22–32)
Calcium: 8.5 mg/dL — ABNORMAL LOW (ref 8.9–10.3)
Chloride: 100 mmol/L (ref 98–111)
Creatinine, Ser: 1.56 mg/dL — ABNORMAL HIGH (ref 0.61–1.24)
GFR, Estimated: 41 mL/min — ABNORMAL LOW (ref >60)
Glucose, Bld: 125 mg/dL — ABNORMAL HIGH (ref 70–99)
Potassium: 5.2 mmol/L — ABNORMAL HIGH (ref 3.5–5.1)
Sodium: 136 mmol/L (ref 135–145)
Total Bilirubin: 1 mg/dL (ref 0.3–1.2)
Total Protein: 6.4 g/dL — ABNORMAL LOW (ref 6.5–8.1)

## 2023-07-15 MED ORDER — LOKELMA 10 G PO PACK: 10.0000 g | PACK | Freq: Every day | ORAL | 0 refills | Status: AC

## 2023-07-15 MED ORDER — SODIUM ZIRCONIUM CYCLOSILICATE 10 G PO PACK
10.0000 g | PACK | Freq: Once | ORAL | Status: AC
  Administered 2023-07-15: 10 g via ORAL
  Filled 2023-07-15: qty 1

## 2023-07-15 NOTE — ED Provider Notes (Signed)
[facility] Provider Note   CSN: [id number] Arrival date & time: 07/15/23  1804     History {Add pertinent medical, surgical, social history, OB history to HPI:1} Chief Complaint  Patient presents with   Hypotension    Mark Stark. is a 89 y.o. male.  HPI      89 year old male with a history of atrial fibrillation, acute diastolic congestive heart failure, CKD stage III, hypertension, inferior mesenteric vein thrombosis, prostate cancer,  Doing regular blood pressure check as he does daily, BP 80s/50s. 2 days has measured low. Has been low off and on since having tick bite, has alpha gal  Have not taken metoprolol for about 2 days because of concerns of blood pressures being low  Gabapentin for neuropathy was NEW 5 days ago  Past Medical History:  Diagnosis Date   Acute diastolic CHF (congestive heart failure) (HCC) 10/25/2021   Anxiety    Aortic insufficiency    Atrial fibrillation (HCC)    Chronic diastolic CHF (congestive heart failure) (HCC)    Chronic kidney disease, stage 3a (HCC)    Diverticulitis    Dyslipidemia    Hypertension    Inferior mesenteric vein thrombosis (HCC)    Mitral regurgitation    Nonrheumatic mitral valve regurgitation 10/28/2021   Paroxysmal atrial fibrillation (HCC) 01/12/2018   Persistent atrial fibrillation (HCC)    Prostate cancer [facility])     Past Surgical History:  Procedure Laterality Date   CARDIOVERSION N/A 03/01/2018   Procedure: CARDIOVERSION;  Surgeon: Lennette Bihari, MD;  Location: [facility] ENDOSCOPY;  Service: Cardiovascular;  Laterality: N/A;   CARDIOVERSION N/A 08/26/2021   Procedure: CARDIOVERSION;  Surgeon: Chrystie Nose, MD;  Location: [facility] ENDOSCOPY;  Service: Cardiovascular;  Laterality: N/A;   CARDIOVERSION N/A 12/06/2021   Procedure: CARDIOVERSION;  Surgeon: Little Ishikawa, MD;  Location: [facility] ENDOSCOPY;  Service: Cardiovascular;  Laterality: N/A;   CATARACT EXTRACTION   01/27/2010   right   CATARACT EXTRACTION  03/04/2010   left   Prostate seed implants     PROSTATE SURGERY       Home Medications Prior to Admission medications   Medication Sig Start Date End Date Taking? Authorizing Provider  acetaminophen (TYLENOL) 325 MG tablet Take 2 tablets (650 mg total) by mouth every 6 (six) hours as needed for mild pain or moderate pain (or Fever >/= 101). 05/29/23   Arrien, York Ram, MD  apixaban (ELIQUIS) 5 MG TABS tablet Take 1 tablet (5 mg total) by mouth 2 (two) times daily. 07/09/23   Fargo, Amy E, NP  b complex vitamins tablet Take 1 tablet by mouth daily.    [provider]  Calcium Carbonate-Vitamin D 600-400 MG-UNIT tablet Take 1 tablet by mouth daily.    [provider]  clorazepate (TRANXENE) 7.5 MG tablet Take 1 tablet (7.5 mg total) by mouth 2 (two) times daily as needed for anxiety. 07/09/23   Fargo, Amy E, NP  cromolyn (NASALCROM) 5.2 MG/ACT nasal spray Place 2 sprays into both nostrils at bedtime. 07/09/23   Fargo, Amy E, NP  empagliflozin (JARDIANCE) 10 MG TABS tablet Take 1 tablet (10 mg total) by mouth daily. 07/09/23   Fargo, Amy E, NP  fenofibrate 160 MG tablet Take 1 tablet (160 mg total) by mouth daily. 07/09/23   Fargo, Amy E, NP  fish oil-omega-3 fatty acids 1000 MG capsule Take 1 g by mouth 2 (two) times daily.    [provider]  gabapentin (NEURONTIN) 100 MG capsule Take 2 capsules (200 mg total) by mouth at bedtime. 07/09/23   Fargo, Amy E, NP  latanoprost (XALATAN) 0.005 % ophthalmic solution Place 1 drop into the left eye at bedtime. 07/09/23   Fargo, Amy E, NP  magic mouthwash w/lidocaine SOLN Take 5 mLs by mouth 4 (four) times daily as needed for mouth pain. Swish and Spit 05/15/23   Seabron Spates R, DO  metoprolol succinate (TOPROL XL) 25 MG 24 hr tablet Take 0.5 tablets (12.5 mg total) by mouth daily. 07/09/23   Fargo, Amy E, NP  mirtazapine (REMERON) 7.5 MG tablet Take 1 tablet (7.5 mg total) by mouth  at bedtime. 07/09/23   Fargo, Amy E, NP  Multiple Vitamin (THEREMS PO) Take 1 tablet by mouth daily.    [provider]  Multiple Vitamins-Minerals (PRESERVISION AREDS 2) CAPS 2 po qd 07/05/17   Zola Button, Grayling Congress, DO  simvastatin (ZOCOR) 10 MG tablet Take 1 tablet (10 mg total) by mouth at bedtime. 07/09/23   Fargo, Amy E, NP  spironolactone (ALDACTONE) 25 MG tablet Take 1/2 tablet (12.5 mg total) by mouth daily. 07/09/23 08/08/23  Octavia Heir, NP  tamsulosin (FLOMAX) 0.4 MG CAPS capsule Take 1 capsule (0.4 mg total) by mouth daily. 07/09/23   Fargo, Amy E, NP  torsemide (DEMADEX) 10 MG tablet Take 1 tablet (10 mg total) by mouth daily. 07/09/23   Fargo, Amy E, NP  torsemide (DEMADEX) 20 MG tablet Take 1 tablet (20 mg total) by mouth daily. In case of weight gain 2 to 3 lbs in 24 hr or 5 lbs in 7 days take 2 tablets daily until weight back to baseline. 07/09/23   Fargo, Amy E, NP  vitamin C (ASCORBIC ACID) 500 MG tablet Take 500 mg by mouth daily.    [provider]      Allergies    Alpha-gal and Sulfonamide derivatives    Review of Systems   Review of Systems  Physical Exam Updated Vital Signs BP 120/71 (BP Location: Left Arm)   Pulse 88   Temp (!) 97.4 F (36.3 C)   Resp 18   Ht 5\' 6"  (1.676 m)   Wt 63 kg   SpO2 99%   BMI 22.42 kg/m  Physical Exam  ED Results / Procedures / Treatments   Labs (all labs ordered are listed, but only abnormal results are displayed) Labs Reviewed  CBC WITH DIFFERENTIAL/PLATELET  COMPREHENSIVE METABOLIC PANEL    EKG None  Radiology No results found.  Procedures Procedures  {Document cardiac monitor, telemetry assessment procedure when appropriate:1}  Medications Ordered in ED Medications - No data to display  ED Course/ Medical Decision Making/ A&P   {   Click here for ABCD2, HEART and other calculatorsREFRESH Note before signing :1}                          Medical Decision Making Amount and/or Complexity of Data  Reviewed Labs: ordered.  Risk Prescription drug management.   ***  {Document critical care time when appropriate:1} {Document review of labs and clinical decision tools ie heart score, Chads2Vasc2 etc:1}  {Document your independent review of radiology images, and any outside records:1} {Document your discussion with family members, caretakers, and with consultants:1} {Document social determinants of health affecting pt's care:1} {Document your decision making why or why not admission, treatments were needed:1} Final Clinical Impression(s) / ED Diagnoses Final diagnoses:  None    Rx / DC Orders ED Discharge Orders     None

## 2023-07-15 NOTE — ED Notes (Signed)
Pt was offered WC for DC but politely declined.

## 2023-07-15 NOTE — ED Triage Notes (Signed)
Patient noticed his blood pressure was 100/80 during his daily check. He recently started new medications and feels like his blood pressure was low  due to that. He stated he has no symptoms. He has an appointment with cardiologist tomorrow.

## 2023-07-16 ENCOUNTER — Ambulatory Visit (INDEPENDENT_AMBULATORY_CARE_PROVIDER_SITE_OTHER): Payer: Medicare Other

## 2023-07-16 ENCOUNTER — Ambulatory Visit (INDEPENDENT_AMBULATORY_CARE_PROVIDER_SITE_OTHER): Payer: Medicare Other | Admitting: Cardiology

## 2023-07-16 ENCOUNTER — Encounter (HOSPITAL_BASED_OUTPATIENT_CLINIC_OR_DEPARTMENT_OTHER): Payer: Self-pay | Admitting: Cardiology

## 2023-07-16 ENCOUNTER — Ambulatory Visit (INDEPENDENT_AMBULATORY_CARE_PROVIDER_SITE_OTHER): Payer: Medicare Other | Admitting: Student

## 2023-07-16 ENCOUNTER — Other Ambulatory Visit: Payer: Self-pay | Admitting: Family Medicine

## 2023-07-16 VITALS — BP 106/56 | HR 93 | Ht 66.0 in | Wt 144.5 lb

## 2023-07-16 DIAGNOSIS — M25511 Pain in right shoulder: Secondary | ICD-10-CM

## 2023-07-16 DIAGNOSIS — Z7901 Long term (current) use of anticoagulants: Secondary | ICD-10-CM

## 2023-07-16 DIAGNOSIS — I340 Nonrheumatic mitral (valve) insufficiency: Secondary | ICD-10-CM | POA: Diagnosis not present

## 2023-07-16 DIAGNOSIS — I4821 Permanent atrial fibrillation: Secondary | ICD-10-CM | POA: Diagnosis not present

## 2023-07-16 DIAGNOSIS — I5032 Chronic diastolic (congestive) heart failure: Secondary | ICD-10-CM

## 2023-07-16 DIAGNOSIS — E871 Hypo-osmolality and hyponatremia: Secondary | ICD-10-CM

## 2023-07-16 DIAGNOSIS — M19011 Primary osteoarthritis, right shoulder: Secondary | ICD-10-CM | POA: Diagnosis not present

## 2023-07-16 DIAGNOSIS — D6869 Other thrombophilia: Secondary | ICD-10-CM

## 2023-07-16 NOTE — Progress Notes (Signed)
Cardiology Office Note:  .    Date:  07/16/2023  ID:  Mark Munson., DOB Mar 14, 1928, MRN [id number] PCP: Mark Stark, Mark Stark  [facility] Providers Cardiologist:  Mark Red, MD { Click to update primary MD,subspecialty MD or APP then REFRESH:1}    History of Present Illness: .    Mark Stark. is a 89 y.o. male with a hx of permanent atrial fibrillation, chronic diastolic CHF, CKD stage 3a, hypertension, dyslipidemia, aortic insufficiency, mitral regurgitation, inferior mesenteric vein thrombosis, prostate cancer, and anxiety, who is seen for follow up.    Cardiovascular history: On presentation to the hospital 10/25/2021 he complained of worsening shortness of breath, LE edema, and orthopnea. CXR revealed small bilateral effusions and mild interstitial edema. He was admitted for congestive heart failure. He did well for approximately 9 mos, but he was admitted to the hospital on 08/22/2022 after calling EMS due to progressive generalized weakness. It was also noted he was "seeing things". He denied any chest or abdominal pain and/or discomfort. EMS noted his blood pressure was low on arrival and improved with fluid therapy. He did exhibit dysarthria on examination at the ER. Given his symptoms and history he was admitted from the ER to be treated in the hospital and undergo further testing. Both initial MRI and and additional MRI showed no evidence of CVA. Symptoms improved.    He was seen in the ED 11/24/2022 for a mechanical fall. He had slipped and landed on his back, striking his head against the ground. He was able to walk but had difficulty getting in and out of a chair due to leg pain. Initial laboratory evaluation with a BUN of 24, creatinine 1.33, leukocytosis to 11.9, hemoglobin 10.3 which was noted to be a significant drop for this patient. CT head and C-spine with no acute traumatic findings.  Chest x-ray, pelvis x-ray with no acute fracture.  CT L-spine with no  acute fracture but did show multilevel foraminal stenosis.  He was found to have some small decreased pulses on the left and thus a CTA aortobifem was obtained. Discharged in stable condition.   In follow-up 12/2022, he had reported his mechanical fall was on 11/22/22. He had a raised, erythematous lesion of his left shin with abrasive skin wounds/discoloration ranging from his left ankle to just below the knee. This used to be constantly painful, but gradually diminished to rare twinges of pain. He was concerned due to slow healing times. He was doing well on his torsemide BID regimen. Occasionally he would skip a dose if he needed to go somewhere. Blood pressures were stable. Heart rate ranged 60-110 at home. Heart rates at home much lower typically than in office. Heart murmur much quieter when euvolemic. We discussed management/dosing of torsemide. He was euvolemic on exam. We discussed changing to daily torsemide and given PRN instructions on when to take a second daily dose. We have had to balance between optimizing heart rate and preventing hypotension. Discussed options and decided to continue his continue current dose of metoprolol.   At his visit 03/2023, he complained of urinary frequency. He was taking Torsemide every day faithfully, then struggled with frequency for 6-7 hours. We reviewed his blood pressure log for March to April with mostly well controlled blood pressures. One low reading of 95/79. In clinic his BP was 130/84. Continued to stay active with maintaining 50 ft rows of peas, beets, lettuce, and radishes. Also noted mild swelling especially in  his left leg since his mechanical fall. He stated his injury was 90% healed. No medication changes at that visit.  He was admitted to the hospital 05/25/2023 with the working diagnosis of hyponatremia and right lower extremity cellulitis in the setting of heart failure exacerbation. Placed on furosemide for diuresis and IV antibiotic therapy for  cellulitis. Discharged 6/4 in stable condition.  On 07/15/23 he had presented to the ED due to concern for blood pressures hypotensive to 80s/50s at home. He therefore had not taken metoprolol for about 2 days. Thought to be related to new gabapentin prescription. He suffered a mechanical fall while ambulating out of the ED with two skin tears to the elbow.   Today, he is accompanied by his friend. He states that he is feeling okay from a cardiovascular perspective. He presents a blood pressure log which is personally reviewed. He had been feeling well and asymptomatic during the time of his hypotensive episodes. In the office today, his blood pressure is 106/56. From time to time he is aware of a little swelling in his ankles bilaterally. Generally has been stable.  He has right shoulder pain since his fall yesterday while at the ED. He is experiencing significant limitations to his range of motion, improved with holding onto his right shoulder with his left hand.  Continues to struggle with dietary restrictions secondary to his alpha-gal allergy. Prior to his tick bite he had been following a stable diet.  After sleeping for 45 minutes, he would experience a fierce burning sensation in his bilateral hands, which persisted until he gets up and tries to be active. Takes two capsules of gabapentin when he goes to bed, and he is now able to sleep through the night.  Additionally he is concerned about potential medication interactions as he has been prescribed multiple new prescriptions in recent months.   He denies any palpitations, chest pain, shortness of breath, peripheral edema, lightheadedness, headaches, syncope, orthopnea, or PND.  ROS:  Please see the history of present illness. ROS otherwise negative except as noted.  (+) Right shoulder pain from recent mechanical fall (+) Intermittent ankle swelling bilaterally  Studies Reviewed: .         Echocardiogram  05/26/2023:  1. Left  ventricular ejection fraction, by estimation, is 70 to 75%. The  left ventricle has hyperdynamic function. The left ventricle has no  regional wall motion abnormalities. Left ventricular diastolic parameters  are indeterminate.   2. Right ventricular systolic function is normal. The right ventricular  size is normal. There is mildly elevated pulmonary artery systolic  pressure. The estimated right ventricular systolic pressure is 43.4 mmHg.   3. Left atrial size was severely dilated.   4. Right atrial size was moderately dilated.   5. The mitral valve is normal in structure. Severe mitral valve  regurgitation. No evidence of mitral stenosis.   6. The aortic valve is calcified. There is moderate calcification of the  aortic valve. There is moderate thickening of the aortic valve. Aortic  valve regurgitation is moderate. Aortic valve sclerosis/calcification is  present, without any evidence of  aortic stenosis. Aortic valve mean gradient measures 6.4 mmHg. Aortic  valve Vmax measures 1.69 m/s.   7. The inferior vena cava is dilated in size with >50% respiratory  variability, suggesting right atrial pressure of 8 mmHg.   Comparison(s): No significant change from prior study. Prior images  reviewed side by side.   Risk Assessment/Calculations:    {Does this patient have ATRIAL  FIBRILLATION?:[phone]}         Physical Exam:    VS:  BP (!) 106/56 (BP Location: Right Arm, Patient Position: Sitting, Cuff Size: Normal)   Pulse 93   Ht 5\' 6"  (1.676 m)   Wt 144 lb 8 oz (65.5 kg)   SpO2 98%   BMI 23.32 kg/m    Wt Readings from Last 3 Encounters:  07/16/23 144 lb 8 oz (65.5 kg)  07/15/23 138 lb 14.2 oz (63 kg)  07/12/23 139 lb (63 kg)    GEN: Well nourished, well developed in no acute distress HEENT: Normal, moist mucous membranes NECK: No JVD CARDIAC: ***Irregularly irregular, normal S1 and S2, no rubs or gallops. ***No murmur. VASCULAR: Radial and DP pulses 2+ bilaterally. No  carotid bruits RESPIRATORY:  Clear to auscultation without rales, wheezing or rhonchi  ABDOMEN: Soft, non-tender, non-distended MUSCULOSKELETAL:  Ambulates independently SKIN: Warm and dry, no edema NEUROLOGIC:  Alert and oriented x 3. No focal neuro deficits noted. PSYCHIATRIC:  Normal affect   ASSESSMENT AND PLAN: .    Chronic diastolic heart failure Moderate to severe mitral regurgitation when volume overloaded -NYHA class 3 on admission, now NYHA class I-II -echo with low normal EF, moderate to severe MR at peak congestion -murmur much quieter when euvolemic -continue metoprolol succinate 12.5 mg daily, avoid bradycardia but also avoid tachycardia with afib, Heart rates at home much lower typically than in office -continue daily torsemide, has PRN instructions on when to take a second daily dose -if symptoms become difficult to manage, would consider TEE to determine if mitraclip would be beneficial. He prefers to avoid procedures based on prior conversations, but if otherwise we cannot manage his symptoms, would re-discuss -bmet today to monitor Cr/K with current diuretic    Atrial fibrillation, permanent Atypical atrial flutter -CHA2DS2/VAS Stroke Risk Points=4  -continue apixaban 5 mg BID. Meets age criteria, monitor renal function. Weight is >60 kg -did not hold rhythm with cardioversion or amiodarone followed by cardioversion. Asymptomatic. No further plans for rhythm control -has been a balance between optimizing heart rate and preventing hypotension. Discussed options, we will continue current dose of metoprolol    Hyperlipidemia -continue simvastatin, reasonable for age -on fenofibrate as well   Cardiac risk counseling and prevention recommendations: -recommend heart healthy/Mediterranean diet, with whole grains, fruits, vegetable, fish, lean meats, nuts, and olive oil. Limit salt. -recommend moderate walking, 3-5 times/week for 30-50 minutes each session. Aim for at  least 150 minutes.week. Goal should be pace of 3 miles/hours, or walking 1.5 miles in 30 minutes -recommend avoidance of tobacco products. Avoid excess alcohol.   Dispo: Follow-up in 3 months, or sooner as needed.  I,Mathew Stumpf,acting as a Neurosurgeon for [employer], MD.,have documented all relevant documentation on the behalf of Mark Red, MD,as directed by  Mark Red, MD while in the presence of Mark Red, MD.  I, Mark Red, MD, have reviewed all documentation for this visit. The documentation on 07/16/23 for the exam, diagnosis, procedures, and orders are all accurate and complete.   Signed, Mark Red, MD

## 2023-07-16 NOTE — ED Notes (Signed)
Pt was discharged by [facility], RN, at which point he declined a wheelchair. Pt suffered mechanical fall while walking to discharge window with his family. Fall occurred just in front of laboratory window. It was not directly witnessed by staff. 2 staff members Aleene Davidson, [place]) walking nearby saw the patient was on the ground directly following his fall and alerted nursing staff. Pt was assisted to a standing position and placed in wheelchair by nursing staff. CN notified EDP who came to assess patient. When asked what caused his fall pt stated "its these shoes". He stated that his shoe caught the floor as he was walking and he tripped. Denied hitting his head or any injury other than his R elbow hurting. EDP removed pts jacket to find 2 nickel-sized skin tears with minimal bleed-through to his shirt. Pt and family maintained they would like to go home as planned. EDP and CN spoke with pt and he was agreeable to wound care only prior to going home. Pt's dc was  undone and pt taken back to his room for wound care. Wound cleanser, and bacitracin applied (per edp verbal order) along with simple dressing. Pt declined further assessment or treatment. Pt wheeled out in wheelchair by staff.

## 2023-07-16 NOTE — Patient Instructions (Signed)
Medication Instructions:  The current medical regimen is effective;  continue present plan and medications.   *If you need a refill on your cardiac medications before your next appointment, please call your pharmacy*   Lab Work: None   Testing/Procedures: None   Follow-Up: At [facility], you and your health needs are our priority.  As part of our continuing mission to provide you with exceptional heart care, we have created designated Provider Care Teams.  These Care Teams include your primary Cardiologist (physician) and Advanced Practice Providers (APPs -  Physician Assistants and Nurse Practitioners) who all work together to provide you with the care you need, when you need it.  We recommend signing up for the patient portal called "MyChart".  Sign up information is provided on this After Visit Summary.  MyChart is used to connect with patients for Virtual Visits (Telemedicine).  Patients are able to view lab/test results, encounter notes, upcoming appointments, etc.  Non-urgent messages can be sent to your provider as well.   To learn more about what you can do with MyChart, go to [email or web address].    Your next appointment:   3 month(s)  Provider:   Bridgette Christopher, MD    Other Instructions None  

## 2023-07-17 ENCOUNTER — Encounter (HOSPITAL_BASED_OUTPATIENT_CLINIC_OR_DEPARTMENT_OTHER): Payer: Self-pay | Admitting: Cardiology

## 2023-07-18 LAB — INTERPRETATION:

## 2023-07-18 LAB — ALPHA-GAL PANEL
Beef: 0.47 kU/L — ABNORMAL HIGH
CLASS: 1
GALACTOSE-ALPHA-1,3-GALACTOSE IGE*: 3.01 kU/L — ABNORMAL HIGH (ref <0.10)

## 2023-07-18 NOTE — Progress Notes (Signed)
Chief Complaint: Right shoulder pain     History of Present Illness:    Mark Stark. is a 89 y.o. male presenting today for evaluation of his right shoulder.  Patient was seen in the emergency department yesterday for evaluation of low blood pressure.  He was evaluated and discharged and while ambulating out of the ED, he tripped and fell onto his right side.  The fall was witnessed and he did not hit his head.  He is on Eliquis for atrial fibrillation.  Patient had 2 skin tears to his right elbow which were cleaned and dressed.  Patient states that since waking up this morning he has noticed increased right shoulder pain.  This is located mostly over the anterior shoulder and is mild to moderate in severity.  He does have some pain with shoulder range of motion.  Denies any numbness or tingling.   Surgical History:   None  PMH/PSH/Family History/Social History/Meds/Allergies:    Past Medical History:  Diagnosis Date   Acute diastolic CHF (congestive heart failure) (HCC) 10/25/2021   Anxiety    Aortic insufficiency    Atrial fibrillation (HCC)    Chronic diastolic CHF (congestive heart failure) (HCC)    Chronic kidney disease, stage 3a (HCC)    Diverticulitis    Dyslipidemia    Hypertension    Inferior mesenteric vein thrombosis (HCC)    Mitral regurgitation    Nonrheumatic mitral valve regurgitation 10/28/2021   Paroxysmal atrial fibrillation (HCC) 01/12/2018   Persistent atrial fibrillation (HCC)    Prostate cancer [facility])    Past Surgical History:  Procedure Laterality Date   CARDIOVERSION N/A 03/01/2018   Procedure: CARDIOVERSION;  Surgeon: Lennette Bihari, MD;  Location: [facility] ENDOSCOPY;  Service: Cardiovascular;  Laterality: N/A;   CARDIOVERSION N/A 08/26/2021   Procedure: CARDIOVERSION;  Surgeon: Chrystie Nose, MD;  Location: [facility] ENDOSCOPY;  Service: Cardiovascular;  Laterality: N/A;   CARDIOVERSION N/A 12/06/2021   Procedure:  CARDIOVERSION;  Surgeon: Little Ishikawa, MD;  Location: [facility] ENDOSCOPY;  Service: Cardiovascular;  Laterality: N/A;   CATARACT EXTRACTION  01/27/2010   right   CATARACT EXTRACTION  03/04/2010   left   Prostate seed implants     PROSTATE SURGERY     Social History   Socioeconomic History   Marital status: Widowed    Spouse name: Not on file   Number of children: Not on file   Years of education: Not on file   Highest education level: Not on file  Occupational History   Occupation: Professor---RETIRED    Employer: RETIRED    Comment: Retired  Tobacco Use   Smoking status: Never   Smokeless tobacco: Never  Vaping Use   Vaping status: Never Used  Substance and Sexual Activity   Alcohol use: No    Comment: Little wine occasionally    Drug use: No   Sexual activity: Never    Partners: Female  Other Topics Concern   Not on file  Social History Narrative   Not on file   Social Determinants of Health   Financial Resource Strain: Low Risk  (02/17/2022)   Overall Financial Resource Strain (CARDIA)    Difficulty of Paying Living Expenses: Not hard at all  Food Insecurity: No Food Insecurity (06/01/2023)   Hunger Vital Sign  Worried About Programme researcher, broadcasting/film/video in the Last Year: Never true    Ran Out of Food in the Last Year: Never true  Transportation Needs: No Transportation Needs (06/01/2023)   PRAPARE - Administrator, Civil Service (Medical): No    Lack of Transportation (Non-Medical): No  Physical Activity: Insufficiently Active (07/04/2023)   Exercise Vital Sign    Days of Exercise per Week: 1 day    Minutes of Exercise per Session: 10 min  Stress: Stress Concern Present (07/04/2023)   [employer] of Occupational Health - Occupational Stress Questionnaire    Feeling of Stress : To some extent  Social Connections: Moderately Integrated (09/26/2022)   Social Connection and Isolation Panel [NHANES]    Frequency of Communication with Friends and Family:  Twice a week    Frequency of Social Gatherings with Friends and Family: Twice a week    Attends Religious Services: More than 4 times per year    Active Member of [employer] or Organizations: Yes    Attends Banker Meetings: Never    Marital Status: Widowed  Recent Concern: Social Connections - Moderately Isolated (08/29/2022)   Social Connection and Isolation Panel [NHANES]    Frequency of Communication with Friends and Family: Three times a week    Frequency of Social Gatherings with Friends and Family: Twice a week    Attends Religious Services: More than 4 times per year    Active Member of [employer] or Organizations: No    Attends Banker Meetings: Never    Marital Status: Widowed   Family History  Problem Relation Age of Onset   Dementia Mother    Alzheimer's disease Mother    Mental illness Mother        ALZ DISEASE   Cancer Brother 62       pancreatic cancer   Allergies  Allergen Reactions   Alpha-Gal Other (See Comments)    Avoidance of meat, dairy products, and other products that contain alpha-gal. Individuals diagnosed with AGS should completely avoid the consumption of mammalian meat, including beef, pork, lamb, venison, goat, and rabbit.    Sulfonamide Derivatives Other (See Comments)    Unknown - patient cannot remember the reaction   Current Outpatient Medications  Medication Sig Dispense Refill   acetaminophen (TYLENOL) 325 MG tablet Take 2 tablets (650 mg total) by mouth every 6 (six) hours as needed for mild pain or moderate pain (or Fever >/= 101).     apixaban (ELIQUIS) 5 MG TABS tablet Take 1 tablet (5 mg total) by mouth 2 (two) times daily. 60 tablet 0   b complex vitamins tablet Take 1 tablet by mouth daily.     Calcium Carbonate-Vitamin D 600-400 MG-UNIT tablet Take 1 tablet by mouth daily.     clorazepate (TRANXENE) 7.5 MG tablet Take 1 tablet (7.5 mg total) by mouth 2 (two) times daily as needed for anxiety. 30 tablet 0   cromolyn  (NASALCROM) 5.2 MG/ACT nasal spray Place 2 sprays into both nostrils at bedtime. 7 mL 0   empagliflozin (JARDIANCE) 10 MG TABS tablet Take 1 tablet (10 mg total) by mouth daily. 30 tablet 0   fenofibrate 160 MG tablet Take 1 tablet (160 mg total) by mouth daily. 30 tablet 0   fish oil-omega-3 fatty acids 1000 MG capsule Take 1 g by mouth 2 (two) times daily.     gabapentin (NEURONTIN) 100 MG capsule Take 2 capsules (200 mg total) by mouth at  bedtime. 60 capsule 0   latanoprost (XALATAN) 0.005 % ophthalmic solution Place 1 drop into the left eye at bedtime. 2.5 mL 0   magic mouthwash w/lidocaine SOLN Take 5 mLs by mouth 4 (four) times daily as needed for mouth pain. Swish and Spit 240 mL 0   metoprolol succinate (TOPROL XL) 25 MG 24 hr tablet Take 0.5 tablets (12.5 mg total) by mouth daily. 15 tablet 0   mirtazapine (REMERON) 7.5 MG tablet Take 1 tablet (7.5 mg total) by mouth at bedtime. 30 tablet 0   Multiple Vitamin (THEREMS PO) Take 1 tablet by mouth daily.     Multiple Vitamins-Minerals (PRESERVISION AREDS 2) CAPS 2 po qd     simvastatin (ZOCOR) 10 MG tablet Take 1 tablet (10 mg total) by mouth at bedtime. 30 tablet 0   spironolactone (ALDACTONE) 25 MG tablet Take 1/2 tablet (12.5 mg total) by mouth daily. 15 tablet 0   tamsulosin (FLOMAX) 0.4 MG CAPS capsule Take 1 capsule (0.4 mg total) by mouth daily. 30 capsule 0   torsemide (DEMADEX) 10 MG tablet Take 1 tablet (10 mg total) by mouth daily. 30 tablet 0   torsemide (DEMADEX) 20 MG tablet Take 1 tablet (20 mg total) by mouth daily. In case of weight gain 2 to 3 lbs in 24 hr or 5 lbs in 7 days take 2 tablets daily until weight back to baseline. 30 tablet 0   vitamin C (ASCORBIC ACID) 500 MG tablet Take 500 mg by mouth daily.     No current facility-administered medications for this visit.   No results found.  Review of Systems:   A ROS was performed including pertinent positives and negatives as documented in the HPI.  Physical Exam :    Constitutional: NAD and appears stated age Neurological: Alert and oriented Psych: Appropriate affect and cooperative There were no vitals taken for this visit.   Comprehensive Musculoskeletal Exam:    No significant tenderness over right proximal humerus, glenohumeral joint, or scapula.  Able to actively forward elevate unassisted to 90 degrees, however is able to press on the shoulder with contralateral side to allow elevation to 150 degrees.  Distal neurosensory exam intact and radial pulses 2+.  Imaging:   Xray (right shoulder 3 views): Negative for fracture or dislocation.   I personally reviewed and interpreted the radiographs.   Assessment:   89 y.o. male with right shoulder pain after sustaining a fall yesterday.  X-rays are negative for any acute bony abnormality and patient does report some improvement in his symptoms.  At this point I recommended pain control as needed with Tylenol as well as utilizing ice as needed.  Should he continue to have issues I would be happy to see him back for further evaluation, but patient mainly wanted to ensure that nothing was broken or significantly damaged.  Plan :    -Recommend Tylenol for pain control -Return to clinic as needed     I personally saw and evaluated the patient, and participated in the management and treatment plan.  Hazle Nordmann, PA-C Orthopedics  This document was dictated using Conservation officer, historic buildings. A reasonable attempt at proof reading has been made to minimize errors.

## 2023-07-20 ENCOUNTER — Telehealth: Payer: Self-pay | Admitting: Family Medicine

## 2023-07-20 DIAGNOSIS — N401 Enlarged prostate with lower urinary tract symptoms: Secondary | ICD-10-CM | POA: Diagnosis not present

## 2023-07-20 DIAGNOSIS — N1831 Chronic kidney disease, stage 3a: Secondary | ICD-10-CM | POA: Diagnosis not present

## 2023-07-20 DIAGNOSIS — Z8546 Personal history of malignant neoplasm of prostate: Secondary | ICD-10-CM | POA: Diagnosis not present

## 2023-07-20 DIAGNOSIS — G609 Hereditary and idiopathic neuropathy, unspecified: Secondary | ICD-10-CM | POA: Diagnosis not present

## 2023-07-20 DIAGNOSIS — F419 Anxiety disorder, unspecified: Secondary | ICD-10-CM | POA: Diagnosis not present

## 2023-07-20 DIAGNOSIS — H401122 Primary open-angle glaucoma, left eye, moderate stage: Secondary | ICD-10-CM | POA: Diagnosis not present

## 2023-07-20 DIAGNOSIS — D696 Thrombocytopenia, unspecified: Secondary | ICD-10-CM | POA: Diagnosis not present

## 2023-07-20 DIAGNOSIS — I5033 Acute on chronic diastolic (congestive) heart failure: Secondary | ICD-10-CM | POA: Diagnosis not present

## 2023-07-20 DIAGNOSIS — H3530 Unspecified macular degeneration: Secondary | ICD-10-CM | POA: Diagnosis not present

## 2023-07-20 DIAGNOSIS — Z7901 Long term (current) use of anticoagulants: Secondary | ICD-10-CM | POA: Diagnosis not present

## 2023-07-20 DIAGNOSIS — I130 Hypertensive heart and chronic kidney disease with heart failure and stage 1 through stage 4 chronic kidney disease, or unspecified chronic kidney disease: Secondary | ICD-10-CM | POA: Diagnosis not present

## 2023-07-20 DIAGNOSIS — I340 Nonrheumatic mitral (valve) insufficiency: Secondary | ICD-10-CM | POA: Diagnosis not present

## 2023-07-20 DIAGNOSIS — F5101 Primary insomnia: Secondary | ICD-10-CM | POA: Diagnosis not present

## 2023-07-20 DIAGNOSIS — Z7984 Long term (current) use of oral hypoglycemic drugs: Secondary | ICD-10-CM | POA: Diagnosis not present

## 2023-07-20 DIAGNOSIS — R350 Frequency of micturition: Secondary | ICD-10-CM | POA: Diagnosis not present

## 2023-07-20 DIAGNOSIS — I4819 Other persistent atrial fibrillation: Secondary | ICD-10-CM | POA: Diagnosis not present

## 2023-07-20 DIAGNOSIS — J309 Allergic rhinitis, unspecified: Secondary | ICD-10-CM | POA: Diagnosis not present

## 2023-07-20 DIAGNOSIS — I351 Nonrheumatic aortic (valve) insufficiency: Secondary | ICD-10-CM | POA: Diagnosis not present

## 2023-07-20 DIAGNOSIS — E785 Hyperlipidemia, unspecified: Secondary | ICD-10-CM | POA: Diagnosis not present

## 2023-07-20 NOTE — Telephone Encounter (Signed)
Caller/Agency: Judie Grieve ([facility]) Callback Number: [phone], ok to LDVM Requesting OT/PT/Skilled Nursing/Social Work/Speech Therapy: PT Frequency: 1 w 4

## 2023-07-20 NOTE — Telephone Encounter (Signed)
Verbal given 

## 2023-07-25 ENCOUNTER — Telehealth: Payer: Self-pay | Admitting: Family Medicine

## 2023-07-25 ENCOUNTER — Other Ambulatory Visit: Payer: Self-pay | Admitting: Family Medicine

## 2023-07-25 DIAGNOSIS — F5101 Primary insomnia: Secondary | ICD-10-CM

## 2023-07-25 MED ORDER — MIRTAZAPINE 7.5 MG PO TABS
7.5000 mg | ORAL_TABLET | Freq: Every day | ORAL | 1 refills | Status: DC
Start: 2023-07-25 — End: 2024-02-21

## 2023-07-25 NOTE — Telephone Encounter (Signed)
Pt said that he was prescribed mirtazapine (REMERON) 7.5 MG tablet [ and thought that he did not need to take it but he thinks that he does need to be back on it. I advised patient that it looks like Amy Coletta Memos may have prescribed it. Pt is not sure who that is and is hard of hearing. He would like Dr. Laury Axon to prescribe this medication and send it to [facility] on [address]. Pt would like a call back if there is an issue.

## 2023-07-26 ENCOUNTER — Other Ambulatory Visit (INDEPENDENT_AMBULATORY_CARE_PROVIDER_SITE_OTHER): Payer: Medicare Other

## 2023-07-26 DIAGNOSIS — E871 Hypo-osmolality and hyponatremia: Secondary | ICD-10-CM | POA: Diagnosis not present

## 2023-07-26 LAB — COMPREHENSIVE METABOLIC PANEL
ALT: 17 U/L (ref 0–53)
AST: 29 U/L (ref 0–37)
Albumin: 3.8 g/dL (ref 3.5–5.2)
Alkaline Phosphatase: 68 U/L (ref 39–117)
BUN: 25 mg/dL — ABNORMAL HIGH (ref 6–23)
CO2: 28 mEq/L (ref 19–32)
Calcium: 8.9 mg/dL (ref 8.4–10.5)
Chloride: 99 mEq/L (ref 96–112)
Creatinine, Ser: 1.3 mg/dL (ref 0.40–1.50)
GFR: 47.5 mL/min — ABNORMAL LOW (ref >60.00)
Glucose, Bld: 99 mg/dL (ref 70–99)
Potassium: 4.9 mEq/L (ref 3.5–5.1)
Sodium: 133 mEq/L — ABNORMAL LOW (ref 135–145)
Total Bilirubin: 1.1 mg/dL (ref 0.2–1.2)
Total Protein: 6.5 g/dL (ref 6.0–8.3)

## 2023-07-27 DIAGNOSIS — D696 Thrombocytopenia, unspecified: Secondary | ICD-10-CM | POA: Diagnosis not present

## 2023-07-27 DIAGNOSIS — I4819 Other persistent atrial fibrillation: Secondary | ICD-10-CM | POA: Diagnosis not present

## 2023-07-27 DIAGNOSIS — N401 Enlarged prostate with lower urinary tract symptoms: Secondary | ICD-10-CM | POA: Diagnosis not present

## 2023-07-27 DIAGNOSIS — I5033 Acute on chronic diastolic (congestive) heart failure: Secondary | ICD-10-CM | POA: Diagnosis not present

## 2023-07-27 DIAGNOSIS — N1831 Chronic kidney disease, stage 3a: Secondary | ICD-10-CM | POA: Diagnosis not present

## 2023-07-27 DIAGNOSIS — I130 Hypertensive heart and chronic kidney disease with heart failure and stage 1 through stage 4 chronic kidney disease, or unspecified chronic kidney disease: Secondary | ICD-10-CM | POA: Diagnosis not present

## 2023-08-03 ENCOUNTER — Other Ambulatory Visit: Payer: Self-pay | Admitting: Orthopedic Surgery

## 2023-08-03 ENCOUNTER — Encounter: Payer: Self-pay | Admitting: Family Medicine

## 2023-08-03 DIAGNOSIS — I130 Hypertensive heart and chronic kidney disease with heart failure and stage 1 through stage 4 chronic kidney disease, or unspecified chronic kidney disease: Secondary | ICD-10-CM | POA: Diagnosis not present

## 2023-08-03 DIAGNOSIS — I5032 Chronic diastolic (congestive) heart failure: Secondary | ICD-10-CM

## 2023-08-03 DIAGNOSIS — N1831 Chronic kidney disease, stage 3a: Secondary | ICD-10-CM | POA: Diagnosis not present

## 2023-08-03 DIAGNOSIS — N401 Enlarged prostate with lower urinary tract symptoms: Secondary | ICD-10-CM | POA: Diagnosis not present

## 2023-08-03 DIAGNOSIS — I4819 Other persistent atrial fibrillation: Secondary | ICD-10-CM | POA: Diagnosis not present

## 2023-08-03 DIAGNOSIS — I5033 Acute on chronic diastolic (congestive) heart failure: Secondary | ICD-10-CM | POA: Diagnosis not present

## 2023-08-03 DIAGNOSIS — D696 Thrombocytopenia, unspecified: Secondary | ICD-10-CM | POA: Diagnosis not present

## 2023-08-06 ENCOUNTER — Encounter: Payer: Self-pay | Admitting: Family Medicine

## 2023-08-07 ENCOUNTER — Other Ambulatory Visit: Payer: Self-pay | Admitting: Orthopedic Surgery

## 2023-08-07 DIAGNOSIS — I5032 Chronic diastolic (congestive) heart failure: Secondary | ICD-10-CM

## 2023-08-09 ENCOUNTER — Other Ambulatory Visit: Payer: Self-pay | Admitting: Family Medicine

## 2023-08-09 ENCOUNTER — Other Ambulatory Visit: Payer: Self-pay | Admitting: Orthopedic Surgery

## 2023-08-09 ENCOUNTER — Other Ambulatory Visit: Payer: Self-pay | Admitting: Cardiology

## 2023-08-09 DIAGNOSIS — G609 Hereditary and idiopathic neuropathy, unspecified: Secondary | ICD-10-CM

## 2023-08-09 DIAGNOSIS — H43813 Vitreous degeneration, bilateral: Secondary | ICD-10-CM | POA: Diagnosis not present

## 2023-08-09 DIAGNOSIS — H35373 Puckering of macula, bilateral: Secondary | ICD-10-CM | POA: Diagnosis not present

## 2023-08-09 DIAGNOSIS — H401122 Primary open-angle glaucoma, left eye, moderate stage: Secondary | ICD-10-CM | POA: Diagnosis not present

## 2023-08-09 DIAGNOSIS — J309 Allergic rhinitis, unspecified: Secondary | ICD-10-CM

## 2023-08-09 DIAGNOSIS — I5032 Chronic diastolic (congestive) heart failure: Secondary | ICD-10-CM

## 2023-08-09 DIAGNOSIS — H353231 Exudative age-related macular degeneration, bilateral, with active choroidal neovascularization: Secondary | ICD-10-CM | POA: Diagnosis not present

## 2023-08-10 ENCOUNTER — Ambulatory Visit: Payer: Medicare Other | Admitting: Family Medicine

## 2023-08-10 DIAGNOSIS — I130 Hypertensive heart and chronic kidney disease with heart failure and stage 1 through stage 4 chronic kidney disease, or unspecified chronic kidney disease: Secondary | ICD-10-CM | POA: Diagnosis not present

## 2023-08-10 DIAGNOSIS — N1831 Chronic kidney disease, stage 3a: Secondary | ICD-10-CM | POA: Diagnosis not present

## 2023-08-10 DIAGNOSIS — I4819 Other persistent atrial fibrillation: Secondary | ICD-10-CM | POA: Diagnosis not present

## 2023-08-10 DIAGNOSIS — N401 Enlarged prostate with lower urinary tract symptoms: Secondary | ICD-10-CM | POA: Diagnosis not present

## 2023-08-10 DIAGNOSIS — D696 Thrombocytopenia, unspecified: Secondary | ICD-10-CM | POA: Diagnosis not present

## 2023-08-10 DIAGNOSIS — I5033 Acute on chronic diastolic (congestive) heart failure: Secondary | ICD-10-CM | POA: Diagnosis not present

## 2023-08-15 DIAGNOSIS — I5033 Acute on chronic diastolic (congestive) heart failure: Secondary | ICD-10-CM | POA: Diagnosis not present

## 2023-08-15 DIAGNOSIS — N1831 Chronic kidney disease, stage 3a: Secondary | ICD-10-CM | POA: Diagnosis not present

## 2023-08-15 DIAGNOSIS — D696 Thrombocytopenia, unspecified: Secondary | ICD-10-CM | POA: Diagnosis not present

## 2023-08-15 DIAGNOSIS — I4819 Other persistent atrial fibrillation: Secondary | ICD-10-CM | POA: Diagnosis not present

## 2023-08-15 DIAGNOSIS — I130 Hypertensive heart and chronic kidney disease with heart failure and stage 1 through stage 4 chronic kidney disease, or unspecified chronic kidney disease: Secondary | ICD-10-CM | POA: Diagnosis not present

## 2023-08-15 DIAGNOSIS — N401 Enlarged prostate with lower urinary tract symptoms: Secondary | ICD-10-CM | POA: Diagnosis not present

## 2023-08-21 ENCOUNTER — Other Ambulatory Visit: Payer: Self-pay | Admitting: Orthopedic Surgery

## 2023-08-21 DIAGNOSIS — I5032 Chronic diastolic (congestive) heart failure: Secondary | ICD-10-CM

## 2023-08-28 ENCOUNTER — Telehealth: Payer: Self-pay | Admitting: Family Medicine

## 2023-08-28 ENCOUNTER — Ambulatory Visit: Payer: Self-pay | Admitting: Licensed Clinical Social Worker

## 2023-08-28 NOTE — Telephone Encounter (Signed)
Pt called asking if it would be good idea to take a trip up to [place] 200 miles away given his condition. Pt also wanted to know if it would be ok to forego Furosemide the day of travel as having to urinate every 15-20 mins would make for a long trip.

## 2023-08-28 NOTE — Patient Outreach (Signed)
  Care Coordination   08/28/2023 Name: Mark Stark. MRN: [id number] DOB: 12/29/1927   Care Coordination Outreach Attempts:  An unsuccessful telephone outreach was attempted today to offer the patient information about available care coordination services.  Follow Up Plan:  Additional outreach attempts will be made to offer the patient care coordination information and services.   Encounter Outcome:  No Answer   Care Coordination Interventions:  No, not indicated    Kelton Pillar.Demontae Antunes MSW, LCSW Licensed Visual merchandiser [facility] Care Management [phone]

## 2023-08-29 ENCOUNTER — Telehealth: Payer: Self-pay | Admitting: *Deleted

## 2023-08-29 NOTE — Progress Notes (Signed)
  Care Coordination Note  08/29/2023 Name: [name]. MRN: [id number] DOB: 10/08/1928  Mark Stark. is a 89 y.o. year old male who is a primary care patient of Donato Schultz, DO and is actively engaged with the care management team. I reached out to Mark Stark. by phone today to assist with re-scheduling a follow up visit with the Licensed Clinical Social Worker  Follow up plan: Patient declines further follow up and engagement by the care management team. Appropriate care team members and provider have been notified via electronic communication.   [facility]  Care Coordination Care Guide  Direct Dial: [phone]

## 2023-08-29 NOTE — Telephone Encounter (Signed)
Attempted to call patient twice. Call had busy signal both times.

## 2023-08-31 NOTE — Telephone Encounter (Signed)
Pt called to follow up on questions regarding travel. Advised pt of PCP instructions per guidance from [facility].

## 2023-09-02 ENCOUNTER — Other Ambulatory Visit: Payer: Self-pay | Admitting: Orthopedic Surgery

## 2023-09-02 DIAGNOSIS — J309 Allergic rhinitis, unspecified: Secondary | ICD-10-CM

## 2023-09-02 DIAGNOSIS — I5032 Chronic diastolic (congestive) heart failure: Secondary | ICD-10-CM

## 2023-09-02 DIAGNOSIS — G609 Hereditary and idiopathic neuropathy, unspecified: Secondary | ICD-10-CM

## 2023-09-04 DIAGNOSIS — Z23 Encounter for immunization: Secondary | ICD-10-CM | POA: Diagnosis not present

## 2023-09-10 DIAGNOSIS — R9721 Rising PSA following treatment for malignant neoplasm of prostate: Secondary | ICD-10-CM | POA: Diagnosis not present

## 2023-09-12 ENCOUNTER — Telehealth: Payer: Self-pay | Admitting: Family Medicine

## 2023-09-12 ENCOUNTER — Other Ambulatory Visit: Payer: Self-pay | Admitting: Orthopedic Surgery

## 2023-09-12 DIAGNOSIS — J309 Allergic rhinitis, unspecified: Secondary | ICD-10-CM

## 2023-09-12 DIAGNOSIS — G609 Hereditary and idiopathic neuropathy, unspecified: Secondary | ICD-10-CM

## 2023-09-12 DIAGNOSIS — I5032 Chronic diastolic (congestive) heart failure: Secondary | ICD-10-CM

## 2023-09-12 MED ORDER — GABAPENTIN 100 MG PO CAPS
200.0000 mg | ORAL_CAPSULE | Freq: Every day | ORAL | 2 refills | Status: DC
Start: 2023-09-12 — End: 2023-10-08

## 2023-09-12 NOTE — Telephone Encounter (Signed)
Patient called and would like a med refill on gabapentin (NEURONTIN) 100 MG capsule . [facility] to [facility] [address]

## 2023-09-12 NOTE — Telephone Encounter (Signed)
Refill sent.

## 2023-09-13 ENCOUNTER — Other Ambulatory Visit: Payer: Self-pay | Admitting: Orthopedic Surgery

## 2023-09-13 DIAGNOSIS — J309 Allergic rhinitis, unspecified: Secondary | ICD-10-CM

## 2023-09-13 DIAGNOSIS — I5032 Chronic diastolic (congestive) heart failure: Secondary | ICD-10-CM

## 2023-09-17 DIAGNOSIS — R351 Nocturia: Secondary | ICD-10-CM | POA: Diagnosis not present

## 2023-09-17 DIAGNOSIS — N401 Enlarged prostate with lower urinary tract symptoms: Secondary | ICD-10-CM | POA: Diagnosis not present

## 2023-09-17 DIAGNOSIS — C61 Malignant neoplasm of prostate: Secondary | ICD-10-CM | POA: Diagnosis not present

## 2023-09-18 ENCOUNTER — Other Ambulatory Visit: Payer: Self-pay | Admitting: Orthopedic Surgery

## 2023-09-18 DIAGNOSIS — I5032 Chronic diastolic (congestive) heart failure: Secondary | ICD-10-CM

## 2023-09-24 ENCOUNTER — Other Ambulatory Visit: Payer: Self-pay | Admitting: Orthopedic Surgery

## 2023-09-24 DIAGNOSIS — H401122 Primary open-angle glaucoma, left eye, moderate stage: Secondary | ICD-10-CM | POA: Diagnosis not present

## 2023-09-24 DIAGNOSIS — I5032 Chronic diastolic (congestive) heart failure: Secondary | ICD-10-CM

## 2023-09-26 DIAGNOSIS — H353221 Exudative age-related macular degeneration, left eye, with active choroidal neovascularization: Secondary | ICD-10-CM | POA: Diagnosis not present

## 2023-09-26 DIAGNOSIS — Z961 Presence of intraocular lens: Secondary | ICD-10-CM | POA: Diagnosis not present

## 2023-09-26 DIAGNOSIS — H52203 Unspecified astigmatism, bilateral: Secondary | ICD-10-CM | POA: Diagnosis not present

## 2023-09-26 DIAGNOSIS — H353113 Nonexudative age-related macular degeneration, right eye, advanced atrophic without subfoveal involvement: Secondary | ICD-10-CM | POA: Diagnosis not present

## 2023-10-04 DIAGNOSIS — H35363 Drusen (degenerative) of macula, bilateral: Secondary | ICD-10-CM | POA: Diagnosis not present

## 2023-10-04 DIAGNOSIS — H353231 Exudative age-related macular degeneration, bilateral, with active choroidal neovascularization: Secondary | ICD-10-CM | POA: Diagnosis not present

## 2023-10-04 DIAGNOSIS — H35373 Puckering of macula, bilateral: Secondary | ICD-10-CM | POA: Diagnosis not present

## 2023-10-08 ENCOUNTER — Ambulatory Visit (INDEPENDENT_AMBULATORY_CARE_PROVIDER_SITE_OTHER): Payer: Medicare Other | Admitting: Family Medicine

## 2023-10-08 ENCOUNTER — Encounter: Payer: Self-pay | Admitting: Family Medicine

## 2023-10-08 VITALS — BP 120/80 | HR 72 | Temp 98.1°F | Resp 18 | Ht 66.0 in | Wt 151.8 lb

## 2023-10-08 DIAGNOSIS — R200 Anesthesia of skin: Secondary | ICD-10-CM

## 2023-10-08 DIAGNOSIS — I5032 Chronic diastolic (congestive) heart failure: Secondary | ICD-10-CM | POA: Diagnosis not present

## 2023-10-08 DIAGNOSIS — E785 Hyperlipidemia, unspecified: Secondary | ICD-10-CM

## 2023-10-08 DIAGNOSIS — G609 Hereditary and idiopathic neuropathy, unspecified: Secondary | ICD-10-CM

## 2023-10-08 DIAGNOSIS — J309 Allergic rhinitis, unspecified: Secondary | ICD-10-CM

## 2023-10-08 DIAGNOSIS — I10 Essential (primary) hypertension: Secondary | ICD-10-CM

## 2023-10-08 DIAGNOSIS — F419 Anxiety disorder, unspecified: Secondary | ICD-10-CM

## 2023-10-08 DIAGNOSIS — Z8546 Personal history of malignant neoplasm of prostate: Secondary | ICD-10-CM

## 2023-10-08 DIAGNOSIS — I480 Paroxysmal atrial fibrillation: Secondary | ICD-10-CM | POA: Diagnosis not present

## 2023-10-08 LAB — COMPREHENSIVE METABOLIC PANEL
ALT: 16 U/L (ref 0–53)
AST: 23 U/L (ref 0–37)
Albumin: 3.7 g/dL (ref 3.5–5.2)
Alkaline Phosphatase: 59 U/L (ref 39–117)
BUN: 32 mg/dL — ABNORMAL HIGH (ref 6–23)
CO2: 30 meq/L (ref 19–32)
Calcium: 9.1 mg/dL (ref 8.4–10.5)
Chloride: 103 meq/L (ref 96–112)
Creatinine, Ser: 1.29 mg/dL (ref 0.40–1.50)
GFR: 47.88 mL/min — ABNORMAL LOW (ref >60.00)
Glucose, Bld: 75 mg/dL (ref 70–99)
Potassium: 5 meq/L (ref 3.5–5.1)
Sodium: 138 meq/L (ref 135–145)
Total Bilirubin: 0.8 mg/dL (ref 0.2–1.2)
Total Protein: 6.4 g/dL (ref 6.0–8.3)

## 2023-10-08 MED ORDER — SIMVASTATIN 10 MG PO TABS
10.0000 mg | ORAL_TABLET | Freq: Every day | ORAL | 1 refills | Status: DC
Start: 2023-10-08 — End: 2024-04-07

## 2023-10-08 MED ORDER — CLORAZEPATE DIPOTASSIUM 7.5 MG PO TABS
7.5000 mg | ORAL_TABLET | Freq: Two times a day (BID) | ORAL | 0 refills | Status: DC | PRN
Start: 2023-10-08 — End: 2023-12-14

## 2023-10-08 MED ORDER — APIXABAN 5 MG PO TABS
5.0000 mg | ORAL_TABLET | Freq: Two times a day (BID) | ORAL | 1 refills | Status: DC
Start: 2023-10-08 — End: 2024-04-07

## 2023-10-08 MED ORDER — SPIRONOLACTONE 25 MG PO TABS
12.5000 mg | ORAL_TABLET | Freq: Every day | ORAL | 1 refills | Status: DC
Start: 2023-10-08 — End: 2023-11-09

## 2023-10-08 MED ORDER — CROMOLYN SODIUM 5.2 MG/ACT NA AERS
2.0000 | INHALATION_SPRAY | Freq: Every day | NASAL | 3 refills | Status: DC
Start: 2023-10-08 — End: 2024-02-19

## 2023-10-08 MED ORDER — GABAPENTIN 100 MG PO CAPS
200.0000 mg | ORAL_CAPSULE | Freq: Every day | ORAL | 1 refills | Status: DC
Start: 2023-10-08 — End: 2024-01-09

## 2023-10-08 NOTE — Assessment & Plan Note (Signed)
Per u rology

## 2023-10-08 NOTE — Assessment & Plan Note (Signed)
Well controlled, no changes to meds. Encouraged heart healthy diet such as the DASH diet and exercise as tolerated.  °

## 2023-10-08 NOTE — Assessment & Plan Note (Signed)
Encourage heart healthy diet such as MIND or DASH diet, increase exercise, avoid trans fats, simple carbohydrates and processed foods, consider a krill or fish or flaxseed oil cap daily.

## 2023-10-08 NOTE — Patient Instructions (Signed)
How to Use a [place] Mor  Canes are used to help with walking. Using a cane makes you more stable, reduces pain, and eases strain on certain muscle groups. There are various kinds of canes. Most have either a single point, four points (quad cane), or three points at the bottom. The best kind of cane for you depends on what you need it for. People with arthritis generally do well with a single-point cane. People who have certain neurological conditions, such as people who have had a stroke, may do better with a quad cane because it allows them to put more weight on it (support more of their body weight). How to choose a cane that fits It is important to use a cane that fits properly. A cane fits properly if the top of the cane comes to your wrist joint when you are standing upright with your arm relaxed at your side. How to use your cane Hold your cane in the hand opposite the injured or weaker side. Always move the cane and the foot of the weaker side with each other (in unison). Walking Put as much weight on the cane as necessary to make walking comfortable, stable, and smooth. Stand tall with good posture and look ahead, not down at your feet. Hold the cane about 2 inches (5 cm) in front or to the side of you. Each time you take a step with your injured leg, move the cane at the same time to help balance you. Going up steps Step first with your stronger foot. Move the cane and the weaker foot up the step at the same time. Always hold the railing with your free hand. Going down steps Step down first with the cane and your weaker foot. Then follow with your stronger foot. Always hold the railing with your free hand. Safety tips for home Take these steps to make your home safer when you are walking with a cane: Be familiar with your home environment. Have sturdy handrails in your bathrooms and hallways. Wear nonslip, comfortable, well-fitting shoes. Use night-lights in the dark. Keep floor  surfaces clean and dry. Keep high-traffic areas uncluttered. Remove any rugs, cords, or loose objects from the floor. Contact a health care provider if: You still feel unsteady on your feet while using the cane. You develop new pain, such as pain in your back, shoulder, wrist, or hip. You develop any numbness or tingling. Get help right away if: You fall. Summary Using a cane makes you more stable, reduces pain, and eases strain on certain muscle groups. A cane fits properly if the top of the cane comes to your wrist joint when you are standing upright with your arm relaxed at your side. The best kind of cane for you depends on what you need it for. Hold your cane in the hand opposite the injured or weaker side. Always move the cane and the foot of the weaker side with each other (in unison). This information is not intended to replace advice given to you by your health care provider. Make sure you discuss any questions you have with your health care provider. Document Revised: 09/28/2020 Document Reviewed: 09/28/2020 Elsevier Patient Education  2024 [employer].

## 2023-10-08 NOTE — Progress Notes (Signed)
Established Patient Office Visit  Subjective   Patient ID: Mark Ryce., male    DOB: [birth year]  Age: 89 y.o. MRN: [id number]  Chief Complaint  Patient presents with   Follow-up    HPI Discussed the use of AI scribe software for clinical note transcription with the patient, who gave verbal consent to proceed.  History of Present Illness   The patient, with a history of heart disease, presents after a fall in the garden. He reports losing his balance and falling backwards, hitting his head. He denies dizziness or other symptoms from the fall, but has noticed ongoing balance issues. He has been using a cane for support and has considered using a walker.  The patient's heart disease has been stable, and he has been able to maintain his usual activities. He has noticed an improvement in leg swelling, which he attributes to his medication regimen. However, he has stopped taking metoprolol due to concerns about low blood pressure.  The patient also reports tingling and numbness in his fingertips, which he has noticed since starting gabapentin for hand problems. He has not noticed any improvement in these symptoms with the medication.  Additionally, the patient has been managing glaucoma and age-related macular degeneration (AMD) with regular injections and reports that these conditions are stable. He is also monitoring a slow-growing prostate cancer, and has noticed a recent increase in his PSA levels.      Patient Active Problem List   Diagnosis Date Noted   Fall 06/29/2023   Allergy to alpha-gal 06/29/2023   Peripheral neuropathy 06/29/2023   Bilateral hand pain 06/11/2023   Insomnia 06/11/2023   Pilonidal cyst 06/11/2023   Allergic reaction to alpha-gal 06/11/2023   Numbness in both hands 06/01/2023   Hyponatremia 05/25/2023   Cellulitis of right lower extremity 05/22/2023   Tick bite of right axilla with infection 05/15/2023   SOB (shortness of breath) 05/15/2023   Hematoma of  left lower leg 01/05/2023   Acute on chronic diastolic CHF (congestive heart failure) (HCC) 08/24/2022   BPH (benign prostatic hyperplasia) 08/23/2022   Generalized weakness 08/22/2022   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   Diverticulitis 08/09/2022   Chronic kidney disease, stage 3a (HCC) 08/09/2022   Primary open angle glaucoma (POAG) of left eye, moderate stage 06/08/2022   Contusion of left orbit 01/05/2022   Chronic diastolic CHF (congestive heart failure) (HCC) 11/09/2021   Nonrheumatic mitral valve regurgitation 10/28/2021   Acute kidney injury superimposed on CKD (HCC) 10/28/2021   Acute diastolic CHF (congestive heart failure) (HCC) 10/25/2021   Secondary hypercoagulable state (HCC) 08/23/2021   Prostate CA (HCC) 07/28/2021   Thrombocytopenia (HCC) 03/30/2021   Screening for HIV (human immunodeficiency virus) 03/30/2021   Monocytosis 03/30/2021   Abnormal platelets (HCC) 03/28/2021   Hip pain 03/28/2021   Dry eyes, bilateral 05/27/2020   Epiretinal membrane (ERM) of both eyes 12/03/2019   Persistent atrial fibrillation (HCC)    Paroxysmal atrial fibrillation (HCC) 01/12/2018   Cystoid macular edema of right eye 11/29/2017   Macular degeneration 12/21/2015   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 eye with active choroidal neovascularization (HCC) 04/30/2014   Myalgia and myositis 11/16/2013   Epiretinal membrane 11/07/2012   Posterior vitreous detachment 11/07/2012   Status post intraocular lens implant 11/07/2012   Fungal ear infection 04/09/2012   Abnormal laboratory test 03/27/2011   Aortic insufficiency 03/27/2011   SYSTOLIC MURMUR 03/05/2011   EDEMA 12/05/2010   ECCHYMOSES, SPONTANEOUS 11/28/2010   CONTUSION,  RIGHT THIGH 11/28/2010   DECREASED HEARING, BILATERAL 09/23/2010   Pain in limb 07/28/2009   ANKLE SPRAIN, LEFT 07/28/2009   CONTUSION, LOWER LEG, LEFT 07/28/2009   DERMATITIS, CONTACT, NOS 10/02/2007   Essential hypertension  05/07/2007   PROSTATE CANCER, HX OF 03/13/2007   Hyperlipidemia 01/15/2007   Anxiety 01/15/2007   H/O oral aphthous ulcers 01/15/2007   Past Medical History:  Diagnosis Date   Acute diastolic CHF (congestive heart failure) (HCC) 10/25/2021   Anxiety    Aortic insufficiency    Atrial fibrillation (HCC)    Chronic diastolic CHF (congestive heart failure) (HCC)    Chronic kidney disease, stage 3a (HCC)    Diverticulitis    Dyslipidemia    Hypertension    Inferior mesenteric vein thrombosis (HCC)    Mitral regurgitation    Nonrheumatic mitral valve regurgitation 10/28/2021   Paroxysmal atrial fibrillation (HCC) 01/12/2018   Persistent atrial fibrillation (HCC)    Prostate cancer [facility])    Past Surgical History:  Procedure Laterality Date   CARDIOVERSION N/A 03/01/2018   Procedure: CARDIOVERSION;  Surgeon: Lennette Bihari, MD;  Location: [facility] ENDOSCOPY;  Service: Cardiovascular;  Laterality: N/A;   CARDIOVERSION N/A 08/26/2021   Procedure: CARDIOVERSION;  Surgeon: Chrystie Nose, MD;  Location: [facility] ENDOSCOPY;  Service: Cardiovascular;  Laterality: N/A;   CARDIOVERSION N/A 12/06/2021   Procedure: CARDIOVERSION;  Surgeon: Little Ishikawa, MD;  Location: [facility] ENDOSCOPY;  Service: Cardiovascular;  Laterality: N/A;   CATARACT EXTRACTION  01/27/2010   right   CATARACT EXTRACTION  03/04/2010   left   Prostate seed implants     PROSTATE SURGERY     Social History   Tobacco Use   Smoking status: Never   Smokeless tobacco: Never  Vaping Use   Vaping status: Never Used  Substance Use Topics   Alcohol use: No    Comment: Little wine occasionally    Drug use: No   Social History   Socioeconomic History   Marital status: Widowed    Spouse name: Not on file   Number of children: Not on file   Years of education: Not on file   Highest education level: Not on file  Occupational History   Occupation: Professor---RETIRED    Employer: RETIRED    Comment: Retired  Tobacco Use    Smoking status: Never   Smokeless tobacco: Never  Vaping Use   Vaping status: Never Used  Substance and Sexual Activity   Alcohol use: No    Comment: Little wine occasionally    Drug use: No   Sexual activity: Never    Partners: Female  Other Topics Concern   Not on file  Social History Narrative   Not on file   Social Determinants of Health   Financial Resource Strain: Low Risk  (02/17/2022)   Overall Financial Resource Strain (CARDIA)    Difficulty of Paying Living Expenses: Not hard at all  Food Insecurity: No Food Insecurity (06/01/2023)   Hunger Vital Sign    Worried About Running Out of Food in the Last Year: Never true    Ran Out of Food in the Last Year: Never true  Transportation Needs: No Transportation Needs (06/01/2023)   PRAPARE - Administrator, Civil Service (Medical): No    Lack of Transportation (Non-Medical): No  Physical Activity: Insufficiently Active (07/04/2023)   Exercise Vital Sign    Days of Exercise per Week: 1 day    Minutes of Exercise per Session: 10 min  Stress: Stress Concern Present (07/04/2023)   [employer] of Occupational Health - Occupational Stress Questionnaire    Feeling of Stress : To some extent  Social Connections: Unknown (09/26/2022)   Social Connection and Isolation Panel [NHANES]    Frequency of Communication with Friends and Family: Twice a week    Frequency of Social Gatherings with Friends and Family: Twice a week    Attends Religious Services: More than 4 times per year    Active Member of [employer] or Organizations: Yes    Attends Banker Meetings: Never    Marital Status: Not on file  Recent Concern: Social Connections - Moderately Isolated (08/29/2022)   Social Connection and Isolation Panel [NHANES]    Frequency of Communication with Friends and Family: Three times a week    Frequency of Social Gatherings with Friends and Family: Twice a week    Attends Religious Services: More than 4 times per year     Active Member of [employer] or Organizations: No    Attends Banker Meetings: Never    Marital Status: Widowed  Intimate Partner Violence: Not At Risk (05/26/2023)   Humiliation, Afraid, Rape, and Kick questionnaire    Fear of Current or Ex-Partner: No    Emotionally Abused: No    Physically Abused: No    Sexually Abused: No   Family Status  Relation Name Status   Mother  Deceased   Father  Deceased   Brother  Deceased   MGM  Deceased   MGF  Deceased   PGM  Deceased   PGF  Deceased  No partnership data on file   Family History  Problem Relation Age of Onset   Dementia Mother    Alzheimer's disease Mother    Mental illness Mother        ALZ DISEASE   Cancer Brother 44       pancreatic cancer   Allergies  Allergen Reactions   Alpha-Gal Other (See Comments)    Avoidance of meat, dairy products, and other products that contain alpha-gal. Individuals diagnosed with AGS should completely avoid the consumption of mammalian meat, including beef, pork, lamb, venison, goat, and rabbit.    Sulfonamide Derivatives Other (See Comments)    Unknown - patient cannot remember the reaction      Review of Systems  Constitutional:  Negative for fever and malaise/fatigue.  HENT:  Negative for congestion.   Eyes:  Negative for blurred vision.  Respiratory:  Negative for shortness of breath.   Cardiovascular:  Negative for chest pain, palpitations and leg swelling.  Gastrointestinal:  Negative for abdominal pain, blood in stool and nausea.  Genitourinary:  Negative for dysuria and frequency.  Musculoskeletal:  Negative for falls.  Skin:  Negative for rash.  Neurological:  Negative for dizziness, loss of consciousness and headaches.  Endo/Heme/Allergies:  Negative for environmental allergies.  Psychiatric/Behavioral:  Negative for depression. The patient is not nervous/anxious.       Objective:     BP 120/80 (BP Location: Left Arm, Patient Position: Sitting, Cuff Size:  Normal)   Pulse 72   Temp 98.1 F (36.7 C) (Oral)   Resp 18   Ht 5\' 6"  (1.676 m)   Wt 151 lb 12.8 oz (68.9 kg)   SpO2 100%   BMI 24.50 kg/m  BP Readings from Last 3 Encounters:  10/08/23 120/80  07/16/23 (!) 106/56  07/15/23 113/78   Wt Readings from Last 3 Encounters:  10/08/23 151 lb 12.8 oz (68.9  kg)  07/16/23 144 lb 8 oz (65.5 kg)  07/15/23 138 lb 14.2 oz (63 kg)   SpO2 Readings from Last 3 Encounters:  10/08/23 100%  07/16/23 98%  07/15/23 98%      Physical Exam Vitals and nursing note reviewed.  Constitutional:      General: He is not in acute distress.    Appearance: Normal appearance. He is well-developed.  HENT:     Head: Normocephalic and atraumatic.  Eyes:     General: No scleral icterus.       Right eye: No discharge.        Left eye: No discharge.  Cardiovascular:     Rate and Rhythm: Normal rate and regular rhythm.     Heart sounds: No murmur heard. Pulmonary:     Effort: Pulmonary effort is normal. No respiratory distress.     Breath sounds: Normal breath sounds.  Musculoskeletal:        General: Normal range of motion.     Cervical back: Normal range of motion and neck supple.     Right lower leg: No edema.     Left lower leg: No edema.  Skin:    General: Skin is warm and dry.  Neurological:     Mental Status: He is alert and oriented to person, place, and time.  Psychiatric:        Mood and Affect: Mood normal.        Behavior: Behavior normal.        Thought Content: Thought content normal.        Judgment: Judgment normal.      No results found for any visits on 10/08/23.  Last CBC Lab Results  Component Value Date   WBC 5.4 07/15/2023   HGB 13.8 07/15/2023   HCT 41.5 07/15/2023   MCV 93.9 07/15/2023   MCH 31.2 07/15/2023   RDW 16.2 (H) 07/15/2023   PLT 168 07/15/2023   Last metabolic panel Lab Results  Component Value Date   GLUCOSE 99 07/26/2023   NA 133 (L) 07/26/2023   K 4.9 07/26/2023   CL 99 07/26/2023   CO2 28  07/26/2023   BUN 25 (H) 07/26/2023   CREATININE 1.30 07/26/2023   GFR 47.50 (L) 07/26/2023   CALCIUM 8.9 07/26/2023   PHOS 4.1 08/23/2022   PROT 6.5 07/26/2023   ALBUMIN 3.8 07/26/2023   BILITOT 1.1 07/26/2023   ALKPHOS 68 07/26/2023   AST 29 07/26/2023   ALT 17 07/26/2023   ANIONGAP 11 07/15/2023   Last lipids Lab Results  Component Value Date   CHOL 105 07/12/2023   HDL 48.60 07/12/2023   LDLCALC 43 07/12/2023   TRIG 67.0 07/12/2023   CHOLHDL 2 07/12/2023   Last hemoglobin A1c Lab Results  Component Value Date   HGBA1C 5.0 05/21/2012   Last thyroid functions Lab Results  Component Value Date   TSH 1.498 05/25/2023   Last vitamin D No results found for: "25OHVITD2", "25OHVITD3", "VD25OH" Last vitamin B12 and Folate Lab Results  Component Value Date   VITAMINB12 484 06/06/2023      The ASCVD Risk score (Arnett DK, et al., 2019) failed to calculate for the following reasons:   The 2019 ASCVD risk score is only valid for ages 42 to 3    Assessment & Plan:   Problem List Items Addressed This Visit       Unprioritized   Numbness in both hands - Primary   Relevant Orders   Ambulatory  referral to Hand Surgery   Anxiety   Relevant Medications   clorazepate (TRANXENE) 7.5 MG tablet   Chronic diastolic CHF (congestive heart failure) (HCC)   Relevant Medications   spironolactone (ALDACTONE) 25 MG tablet   simvastatin (ZOCOR) 10 MG tablet   apixaban (ELIQUIS) 5 MG TABS tablet   Other Relevant Orders   Comprehensive metabolic panel   Peripheral neuropathy   Relevant Medications   clorazepate (TRANXENE) 7.5 MG tablet   gabapentin (NEURONTIN) 100 MG capsule   Paroxysmal atrial fibrillation (HCC)   Relevant Medications   spironolactone (ALDACTONE) 25 MG tablet   simvastatin (ZOCOR) 10 MG tablet   apixaban (ELIQUIS) 5 MG TABS tablet   PROSTATE CANCER, HX OF    Per urology      Hyperlipidemia    Encourage heart healthy diet such as MIND or DASH diet,  increase exercise, avoid trans fats, simple carbohydrates and processed foods, consider a krill or fish or flaxseed oil cap daily.        Relevant Medications   spironolactone (ALDACTONE) 25 MG tablet   simvastatin (ZOCOR) 10 MG tablet   apixaban (ELIQUIS) 5 MG TABS tablet   Other Relevant Orders   Comprehensive metabolic panel   Essential hypertension    Well controlled, no changes to meds. Encouraged heart healthy diet such as the DASH diet and exercise as tolerated.        Relevant Medications   spironolactone (ALDACTONE) 25 MG tablet   simvastatin (ZOCOR) 10 MG tablet   apixaban (ELIQUIS) 5 MG TABS tablet   Other Visit Diagnoses     Allergic rhinitis, unspecified seasonality, unspecified trigger       Relevant Medications   cromolyn (NASALCROM) 5.2 MG/ACT nasal spray     Assessment and Plan    Fall with head trauma Recent fall in the garden with subsequent bruising on the face. No dizziness or loss of consciousness reported. Patient has a history of balance issues. -Continue use of cane for stability. -Consider physical therapy for balance exercises.  Hypotension Patient-reported low blood pressure readings at home. Patient has self-discontinued Metoprolol due to concerns of low blood pressure. -Message to cardiologist regarding low blood pressure and rapid heart rate. -Check blood pressure and heart rate at follow-up with nurse practitioner on 11/05/2023.  Prostate Cancer Patient reports rising PSA levels. Last seen by Dr. Chauncy Lean who suggested re-evaluation in six months. -Encourage patient to schedule follow-up appointment with Dr. Chauncy Lean.  Age-related Macular Degeneration (AMD) and Glaucoma AMD stable with injections every eight weeks. Glaucoma improving with treatment in both eyes. -Continue current treatment plan.  Hearing Impairment Patient reports plans to get new hearing aids. -Encourage patient to update the clinic once new hearing aids are obtained.  General  Health Maintenance -Check kidney function and sodium levels today. -Continue current medications as prescribed. -Encourage patient to continue with balance exercises at home.        Return if symptoms worsen or fail to improve.    Donato Schultz, DO

## 2023-10-11 DIAGNOSIS — H903 Sensorineural hearing loss, bilateral: Secondary | ICD-10-CM | POA: Diagnosis not present

## 2023-10-13 ENCOUNTER — Other Ambulatory Visit: Payer: Self-pay | Admitting: Orthopedic Surgery

## 2023-10-13 DIAGNOSIS — I5032 Chronic diastolic (congestive) heart failure: Secondary | ICD-10-CM

## 2023-10-18 ENCOUNTER — Ambulatory Visit (HOSPITAL_BASED_OUTPATIENT_CLINIC_OR_DEPARTMENT_OTHER): Payer: Medicare Other | Admitting: Cardiology

## 2023-10-20 ENCOUNTER — Other Ambulatory Visit: Payer: Self-pay | Admitting: Orthopedic Surgery

## 2023-10-20 DIAGNOSIS — I5032 Chronic diastolic (congestive) heart failure: Secondary | ICD-10-CM

## 2023-10-23 ENCOUNTER — Telehealth: Payer: Self-pay | Admitting: Family Medicine

## 2023-10-23 ENCOUNTER — Other Ambulatory Visit: Payer: Self-pay | Admitting: Family Medicine

## 2023-10-23 DIAGNOSIS — I5032 Chronic diastolic (congestive) heart failure: Secondary | ICD-10-CM

## 2023-10-23 NOTE — Telephone Encounter (Signed)
Pt called to advise that his prescription for Jardiance keeps going to Hazle Nordmann, NP instead of Dr. Zola Button. Patient said he was given 3 pills but he does not have anymore and this is not a medication you should stop. Please send refill to [facility] on [address].

## 2023-10-23 NOTE — Telephone Encounter (Signed)
Rx sent 

## 2023-11-05 ENCOUNTER — Ambulatory Visit (INDEPENDENT_AMBULATORY_CARE_PROVIDER_SITE_OTHER): Payer: Medicare Other | Admitting: Family

## 2023-11-05 ENCOUNTER — Encounter (HOSPITAL_BASED_OUTPATIENT_CLINIC_OR_DEPARTMENT_OTHER): Payer: Self-pay | Admitting: Family

## 2023-11-05 VITALS — BP 102/60 | HR 106 | Ht 65.0 in | Wt 154.4 lb

## 2023-11-05 DIAGNOSIS — I5032 Chronic diastolic (congestive) heart failure: Secondary | ICD-10-CM | POA: Diagnosis not present

## 2023-11-05 DIAGNOSIS — I4821 Permanent atrial fibrillation: Secondary | ICD-10-CM | POA: Diagnosis not present

## 2023-11-05 DIAGNOSIS — D6859 Other primary thrombophilia: Secondary | ICD-10-CM

## 2023-11-05 NOTE — Patient Instructions (Signed)
Medication Instructions:  Your physician has recommended you make the following change in your medication:  Take Torsemide 20 mg in the morning then 10 mg with lunch for 2 days then continue Torsemide 20 mg daily.  *If you need a refill on your cardiac medications before your next appointment, please call your pharmacy*  Follow-Up: At [facility], you and your health needs are our priority.  As part of our continuing mission to provide you with exceptional heart care, we have created designated Provider Care Teams.  These Care Teams include your primary Cardiologist (physician) and Advanced Practice Providers (APPs -  Physician Assistants and Nurse Practitioners) who all work together to provide you with the care you need, when you need it.  We recommend signing up for the patient portal called "MyChart".  Sign up information is provided on this After Visit Summary.  MyChart is used to connect with patients for Virtual Visits (Telemedicine).  Patients are able to view lab/test results, encounter notes, upcoming appointments, etc.  Non-urgent messages can be sent to your provider as well.   To learn more about what you can do with MyChart, go to [email or web address].    Your next appointment:   3 month(s)  Provider:   Jodelle Red, MD

## 2023-11-05 NOTE — Progress Notes (Signed)
Cardiology Office Note:  .   Date:  11/05/2023  ID:  Mark Munson., DOB 12-15-1928, MRN [id number] PCP: Zola Button, Grayling Congress, DO   [facility] Providers Cardiologist:  Jodelle Red, MD    History of Present Illness: .   Mark Akes. is a 89 y.o. male with history of permanent atrial fibrillation, chronic diastolic heart failure, [name], hypertension, dyslipidemia, aortic insufficiency, mitral regurgitation, inferior mesenteric vein thrombosis, prostate cancer, anxiety, alpha gal. Has not maintained NSR despite prior cardioversion and amiodarone.   Hospitalized 10/2021 with worsening dyspnea, bilateral pleural effusion, congestive heart failure.  Readmitted 07/2022 with progressive weakness, hypotension.  MRI with no evidence of CVA.  Admitted 04/2019 for hyponatremia and RLE cellulitis in the setting of heart failure exacerbation.  Treated with IV diuresis and antibiotic therapy.  Seen 07/14/2023 in ED with hypotension 80 over 50s at home.  Suffered mechanical fall while ambulating out of the ED with 2 skin tears to the elbow.  Hypotension felt to be related to new gabapentin prescription.  Last seen 07/16/2023 by Dr. Cristal Deer NYHA II overall euvolemic.  Metoprolol succinate 12.5 mg daily and torsemide were continued.  He was noted that her symptoms more difficult to manage consider TEE due to intermittent MitraClip beneficial though previously preferred to avoid procedures.  Pleasant gentleman who presents today for follow up independently. Recently enjoyed a trip to [place] to visit his son and daughter in law, had a kind friend who drove him there. Notes "I stay a little short of breath". Still exercising by aerobic walking. Does note a dry cough which has been on going unchanged for a couple of months. Notes his weight is up about 2 lbs over the last week. Weight today up 10 lbs compared to clinic visit 4 months ago.  Does note eating larger evening meal.  He is no  longer taking Metoprolol as was having persistent hypotension.  Does attribute some of this is the low energy level to relative hypotension.  Home readings show Systolic BP 103-111. HR 55-89bpm. Checks blood pressure first thing in the morning then takes his medications.   ROS: Please see the history of present illness.    All other systems reviewed and are negative.   Studies Reviewed: .        Cardiac Studies & Procedures       ECHOCARDIOGRAM  ECHOCARDIOGRAM COMPLETE 05/26/2023  Narrative ECHOCARDIOGRAM REPORT    Patient Name:   Mark Taiwo. Date of Exam: 05/26/2023 Medical Rec #:  [id number]       Height:       66.0 in Accession #:    [id number]      Weight:       146.4 lb Date of Birth:  1928/12/18        BSA:          1.751 m Patient Age:    89 years        BP:           90/70 mmHg Patient Gender: M               HR:           109 bpm. Exam Location:  Inpatient  Procedure: 2D Echo, Color Doppler and Cardiac Doppler  Indications:    dyspnea  History:        Patient has prior history of Echocardiogram examinations, most recent 08/23/2022. CHF, chronic kidney disease, Arrythmias:Atrial Fibrillation, Signs/Symptoms:Edema; Risk  Factors:Hypertension and Dyslipidemia.  Sonographer:    Delcie Roch RDCS Referring Phys: [id number] Mark DANIEL ARRIEN  IMPRESSIONS   1. Left ventricular ejection fraction, by estimation, is 70 to 75%. The left ventricle has hyperdynamic function. The left ventricle has no regional wall motion abnormalities. Left ventricular diastolic parameters are indeterminate. 2. Right ventricular systolic function is normal. The right ventricular size is normal. There is mildly elevated pulmonary artery systolic pressure. The estimated right ventricular systolic pressure is 43.4 mmHg. 3. Left atrial size was severely dilated. 4. Right atrial size was moderately dilated. 5. The mitral valve is normal in structure. Severe mitral valve regurgitation. No  evidence of mitral stenosis. 6. The aortic valve is calcified. There is moderate calcification of the aortic valve. There is moderate thickening of the aortic valve. Aortic valve regurgitation is moderate. Aortic valve sclerosis/calcification is present, without any evidence of aortic stenosis. Aortic valve mean gradient measures 6.4 mmHg. Aortic valve Vmax measures 1.69 m/s. 7. The inferior vena cava is dilated in size with >50% respiratory variability, suggesting right atrial pressure of 8 mmHg.  Comparison(s): No significant change from prior study. Prior images reviewed side by side.  FINDINGS Left Ventricle: Left ventricular ejection fraction, by estimation, is 70 to 75%. The left ventricle has hyperdynamic function. The left ventricle has no regional wall motion abnormalities. The left ventricular internal cavity size was normal in size. There is no left ventricular hypertrophy. Left ventricular diastolic parameters are indeterminate.  Right Ventricle: The right ventricular size is normal. No increase in right ventricular wall thickness. Right ventricular systolic function is normal. There is mildly elevated pulmonary artery systolic pressure. The tricuspid regurgitant velocity is 3.18 m/s, and with an assumed right atrial pressure of 3 mmHg, the estimated right ventricular systolic pressure is 43.4 mmHg.  Left Atrium: Left atrial size was severely dilated.  Right Atrium: Right atrial size was moderately dilated.  Pericardium: There is no evidence of pericardial effusion.  Mitral Valve: The mitral valve is normal in structure. Mild mitral annular calcification. Severe mitral valve regurgitation. No evidence of mitral valve stenosis.  Tricuspid Valve: The tricuspid valve is normal in structure. Tricuspid valve regurgitation is mild . No evidence of tricuspid stenosis.  Aortic Valve: The aortic valve is calcified. There is moderate calcification of the aortic valve. There is moderate  thickening of the aortic valve. Aortic valve regurgitation is moderate. Aortic valve sclerosis/calcification is present, without any evidence of aortic stenosis. Aortic valve mean gradient measures 6.4 mmHg. Aortic valve peak gradient measures 11.5 mmHg. Aortic valve area, by VTI measures 1.50 cm.  Pulmonic Valve: The pulmonic valve was normal in structure. Pulmonic valve regurgitation is trivial. No evidence of pulmonic stenosis.  Aorta: The aortic root is normal in size and structure.  Venous: The inferior vena cava is dilated in size with greater than 50% respiratory variability, suggesting right atrial pressure of 8 mmHg.  IAS/Shunts: No atrial level shunt detected by color flow Doppler.   LEFT VENTRICLE PLAX 2D LVIDd:         4.60 cm LVIDs:         2.40 cm LV PW:         1.00 cm LV IVS:        1.00 cm LVOT diam:     1.90 cm LV SV:         39 LV SV Index:   22 LVOT Area:     2.84 cm   RIGHT VENTRICLE  IVC RV Basal diam:  3.40 cm     IVC diam: 2.20 cm RV S prime:     11.40 cm/s TAPSE (M-mode): 1.5 cm  LEFT ATRIUM              Index        RIGHT ATRIUM           Index LA diam:        5.30 cm  3.03 cm/m   RA Area:     24.50 cm LA Vol (A2C):   114.0 ml 65.09 ml/m  RA Volume:   76.00 ml  43.39 ml/m LA Vol (A4C):   117.0 ml 66.80 ml/m LA Biplane Vol: 122.0 ml 69.66 ml/m AORTIC VALVE AV Area (Vmax):    1.41 cm AV Area (Vmean):   1.34 cm AV Area (VTI):     1.50 cm AV Vmax:           169.40 cm/s AV Vmean:          117.400 cm/s AV VTI:            0.259 m AV Peak Grad:      11.5 mmHg AV Mean Grad:      6.4 mmHg LVOT Vmax:         84.22 cm/s LVOT Vmean:        55.300 cm/s LVOT VTI:          0.137 m LVOT/AV VTI ratio: 0.53  AORTA Ao Root diam: 3.30 cm Ao Asc diam:  3.70 cm  MR Peak grad:    88.4 mmHg    TRICUSPID VALVE MR Mean grad:    58.0 mmHg    TR Peak grad:   40.4 mmHg MR Vmax:         470.00 cm/s  TR Vmax:        318.00 cm/s MR Vmean:         365.0 cm/s MR PISA:         3.08 cm     SHUNTS MR PISA Eff ROA: 25 mm       Systemic VTI:  0.14 m MR PISA Radius:  0.70 cm      Systemic Diam: 1.90 cm  Donato Schultz MD Electronically signed by Donato Schultz MD Signature Date/Time: 05/26/2023/6:18:43 PM    Final    MONITORS  CARDIAC EVENT MONITOR 01/21/2018  Narrative Atrial fib Rare 2 - 3 second pauses.           Risk Assessment/Calculations:    CHA2DS2-VASc Score = 5   This indicates a 7.2% annual risk of stroke. The patient's score is based upon: CHF History: 1 HTN History: 1 Diabetes History: 0 Stroke History: 0 Vascular Disease History: 1 Age Score: 2 Gender Score: 0            Physical Exam:   VS:  BP 102/60   Pulse (!) 128   Ht 5\' 5"  (1.651 m)   Wt 154 lb 6.4 oz (70 kg)   SpO2 98%   BMI 25.69 kg/m    Wt Readings from Last 3 Encounters:  11/05/23 154 lb 6.4 oz (70 kg)  10/08/23 151 lb 12.8 oz (68.9 kg)  07/16/23 144 lb 8 oz (65.5 kg)    Vitals:   11/05/23 0915 11/05/23 0949  BP: 102/60   Pulse: (!) 128 (!) 106  Height: 5\' 5"  (1.651 m)   Weight: 154 lb 6.4 oz (70 kg)   SpO2: 98%   BMI (  Calculated): 25.69     GEN: Well nourished, well developed in no acute distress NECK: No JVD; No carotid bruits CARDIAC: IRIR, no murmurs, rubs, gallops RESPIRATORY:  Clear to auscultation without rales, wheezing or rhonchi  ABDOMEN: Soft, non-tender, non-distended EXTREMITIES:  No edema; No deformity   ASSESSMENT AND PLAN: .    Chronic diastolic heart failure /moderate to severe MR - Notes dry cough x 2 months with 2 lb weight gain over last week at home and 10 lb weight gain over 4 mos since last OV concerning for volume overload.  Increase torsemide to 20 mg a.m. and 10 mg at lunch for 2 days.  Then return to 20 mg daily.  Continue spironolactone 12.5 mg daily.  Atrial fibrillation/hypercoagulable state- Rate controlled with home readings 55-9 bpm despite stopping Metoprolol as was having hypotension. He will  contact us if heart rate at home persistently >100 bpm.  Initial heart rate in clinic today 128 bpm after ambulating which improved to 106 bpm.  If we need to resume beta-blocker in the future may need to stop spironolactone to allow BP room.  HLD- Continue Simvastatin, Fenofibrate. Appropriate regimen given age.        Dispo: follow up in 3-4 months with Dr. Cristal Deer  Signed, Alver Sorrow, NP

## 2023-11-09 ENCOUNTER — Other Ambulatory Visit: Payer: Self-pay | Admitting: Orthopedic Surgery

## 2023-11-09 ENCOUNTER — Other Ambulatory Visit: Payer: Self-pay | Admitting: Family Medicine

## 2023-11-09 DIAGNOSIS — I5032 Chronic diastolic (congestive) heart failure: Secondary | ICD-10-CM

## 2023-12-04 NOTE — Progress Notes (Signed)
 Ophthalmology Clinic note    CHIEF COMPLAINT 9 week follow up with OCT scan. Vision is same  HISTORY OF PRESENT ILLNESSES Mark Stark. is a 89 y.o. male who presents to the clinic today for a follow-up evaluation of subretinal  hemorrhage OS, s/p Vabysmo  OS x 9 on 10/04/23. Patient has a hx of exudative AMD OU,  s/p IVA OS x 14 on 07/06/22 and s/p IVA OD x 9 on 11/25/20 (first on 02/06/19). He has  a hx of PVD OU, and PC IOL OU by Dr. Leslee. Dx with A fib, is now wearing heart monitor and taking Eliquis .      INTERVAL OF HEALTH:   - Hx of Arthritis in hip and spine.   - Cardioversion 08/26/21, put on amiodorone for CHF and Afib  - Hospitalized 11/22 for four days.  - Fall on left side 01/03/21. He had orbital bruise around left eye and reports that fall did not effect his vision. Also had a bruise on his left leg.  - Previously on amiodarone , but off of it at present.   - Patient recently hospitalized for a tick bite with alpha gal  - Today the patient reports health is stable    MEDICATIONS Outpatient Encounter Medications as of 12/06/2023  Medication Sig Dispense Refill  . amiodarone  (PACERONE ) 200 mg tablet Take 200 mg by mouth.    . apixaban  (Eliquis ) 5 mg tab Take 5 mg by mouth 2 (two) times a day.    . b complex vitamins cap capsule Take 1 capsule by mouth Once Daily.    . calcium  carbonate (OS-CAL) 1500 mg (600 mg calcium ) tablet Take 600 mg by mouth 2 (two) times a day with meals.    . clorazepate  (TRANXENE ) 15 mg tablet Take 15 mg by mouth 2 (two) times a day.    . cromolyn  (NASALCROM ) 5.2 mg/spray (4 %) spry nasal spray Administer 1 spray into affected nostril(s) 4 (four) times a day.    . doxazosin  (CARDURA ) 4 mg tablet Take 4 mg by mouth nightly.    . fenofibrate  (LOFIBRA) 160 mg tablet Take 160 mg by mouth Once Daily.    . latanoprost  (XALATAN ) 0.005 % ophthalmic solution Administer 1 drop into each eyes at bedtime. 2.5 mL 11  . metoprolol  succinate (TOPROL   XL) 100 mg 24 hr tablet Take 100 mg by mouth Once Daily.    SABRA omega 3-dha-epa-fish oil (OMEGA 3) 1,000 mg capsule Take 1 g by mouth Once Daily.    . potassium chloride  (KLOR-CON ) 10 mEq ER tablet Take  by mouth.    . simvastatin  (ZOCOR ) 10 mg tablet Take 10 mg by mouth.    . tamsulosin  (FLOMAX ) 0.4 mg cap TAKE 1 CAPSULE BY MOUTH EVERYDAY AT BEDTIME    . torsemide  (DEMADEX ) 20 mg tablet Take 20 mg by mouth Once Daily.    . vit C,E-Zn-coppr-lutein-zeaxan (PreserVision AREDS-2) 250-90-40-1 mg cap capsule Take 1 tablet by mouth 2 (two) times a day.     Facility-Administered Encounter Medications as of 12/06/2023  Medication Dose Route Frequency Provider Last Rate Last Admin  . [COMPLETED] faricimab -svoa (VABYSMO ) intravitreal syringe 6 mg  6 mg intravitreal Once PRN Craig Michael Greven, MD   6 mg at 12/06/23 1202    ALLERGIES Allergies  Allergen Reactions  . Sulfamethoxazole Other (See Comments)    Pt. Doesn't remember - adverse reaction was so long ago    PAST MEDICAL HISTORY Past Medical History:  Diagnosis Date  . Arthritis   . Atrial fibrillation (CMD)   . Cataract   . CHF (congestive heart failure) (CMD)   . Macular degeneration   . Prostate cancer (CMD) 2008   Med Hx: Prostate cancer (HCC); PSA normal now   Past Surgical History:  Procedure Laterality Date  . AVASTIN  INJECTION Bilateral    Procedure: AVASTIN  INJECTION  . CARDIOVERSION  03/01/2018   Procedure: CARDIOVERSION  . CATARACT EXTRACTION     Procedure: CATARACT EXTRACTION; Dr. McCuen  O.U. 2011    SOCIAL HISTORY Social History   Tobacco Use  . Smoking status: Former    Types: Cigarettes    Start date: 11/07/1972  . Smokeless tobacco: Never  Substance Use Topics  . Alcohol use: Yes  . Drug use: No    FAMILY HISTORY Family History  Problem Relation Name Age of Onset  . Hypertension Father    . Stroke Father    . Cancer Maternal Uncle    . Blindness Paternal Uncle    . Amblyopia Neg Hx    .  Cataracts Neg Hx    . Diabetes Neg Hx    . Glaucoma Neg Hx    . Macular degeneration Neg Hx    . Retinal detachment Neg Hx    . Strabismus Neg Hx    . Thyroid  disease Neg Hx      OPHTHALMIC REVIEW OF SYSTEMS Negative unless otherwise specified.  Redness,  Irritation, pain, discharge, dryness, itching Flashes, floaters, visual field defect Loss of vision, blurred vision , Double vision, pain on eye movement, misaligned eyes Glare, Lid abnormalities      OPHTHALMIC EXAM    Base Eye Exam     Visual Acuity (Snellen - Linear)       Right Left   Dist cc CF at 3' 20/60   Dist ph cc  NI    Correction: Glasses         Tonometry (Tonopen: Tetracaine OU, 11:14 AM)       Right Left   Pressure 13 14         Pupils       APD   Right None   Left None         Neuro/Psych     Oriented x3: Yes   Mood/Affect: Normal         Dilation     Both eyes: 2.5% Phenylephrine , 0.5% Proparacaine, 1.0% Tropicamide @ 11:09 AM           Slit Lamp and Fundus Exam     External Exam       Right Left   External Normal Normal         Fundus Exam       Right Left   Disc Normal Normal   C/D Ratio 0.6 0.7   Macula Atrophy, subretinal fibrosis, in c shape outside of ped Heme gone, drusen and rpe changes, ERM   Vessels Normal Normal   Periphery attached, flat attached, flat              ASSESSMENT   1. Exudative age-related macular degeneration, left eye, with active choroidal neovascularization (CMD)  OCT, Macula - OU - Both Eyes   Intravitreal Injection, Pharmacologic Agent - OS - Left Eye   faricimab -svoa (VABYSMO ) intravitreal syringe 6 mg    2. Exudative age-related macular degeneration, right eye, with active choroidal neovascularization (CMD)  OCT, Macula - OU - Both Eyes    3. Primary open  angle glaucoma (POAG) of left eye, moderate stage          Exudative age related macular degeneration, left eye  (progressed to wet 05/26/20). Goal is  improvement and then maintenance. Heme resolved last visit. No heme or active leakage today. Recommend continuing treatment with Vabysmo  injection OS. Patient understands and elects to proceed with treatment. Vabysmo  OS today.   Exudative age related macular degeneration, right  eye (Progressed to wet 02/06/19). No heme or active leakage. Previously discussed treatment options vs observation and he wishes to continue with observation as this is his worse eye.    POAG OS.  Followed by Dr. Geneva     PLAN: Obtained OCT today Vabysmo  OS x 10 today  Continue AREDS 2 vitamins Recommend lubricant drops for dry eye symptoms Continue latan OU and following up with Dr. Geneva Return in about 9 weeks (around 02/07/2024) for OCT.       Diagnosis:  Exudative AMD with subfoveal CNV left eye    Procedure:  Intravitreal Vabysmo  #10 left eye    SURGEON:                                        Lorrene HERO. Greven, M.D.     ANESTHESIA:                                               Topical    The patient was identified.  The efficacy of anti-VEGF agents and alternative therapies and results of clinical trials had been reviewed.  The risks of the procedure including potential systemic risks as well as the local risks like infection, endophthalmitis,  retinal detachment and cataract were discussed.  The right eye was marked and a time out was performed identifying the right eye as the correct eye.     Subsequently, the patient was placed in the supine position.  A lid speculum was placed to expose the eye.  Topical anesthetic drops were given  prior to placing the speculum.  Subsequently, 4% Lidocaine  on a sterile pledget was held over the superotemporal quadrant giving adequate anesthesia.  The area was then cleaned extensively with Betadine swabs and allowed to dry.  At this time, 3.5 mm  posterior to the limbus utilizing a 30-gauge needle, 0.05 cc of Vabysmo  was injected into the vitreous cavity under direct  visualization.  The needle was then withdrawn from the eye and the retina was visualized.  The central retinal artery was patent.   The medication was noted to be in the vitreous cavity.    There were no  complications.   Discharge instructions were reviewed.  Patient Instructions  Post operative injection instructions  1.  Most patients experience some blurred vision, floaters, and itching or mild discomfort following an injection.  This typically lasts a few days or less and is normal.  2.  If you experience increased levels of eye pain and discomfort, significant decreased vision, flashes of light, increased redness, sensitivity to light, fever or other unexpected symptoms , please contact us  immediately.  3.  Tylenol  or Motrin or Aleve may be taken for a headache after the injection.  4.  Avoid rubbing your eyes.  5.  Always wash your hands prior to putting in eye drops.  How to use eyedrops.  Tilt your head back slightly and gently pull your lower eyelid down.  Squeeze one drop into the pocket formed by pulling your lid down.  If using more than one type of drop, wait 5 minutes between medications.  Eye Drop Instructions  You may use artificial tear drops if your eye is irritated.  Brands include Refresh and Systane.       Lorrene HERO. Cheree MD, FACS Charlie JUDITHANN Ferrier Professor and [facility] Department of Ophthalmology [facility] of Medicine  Abbreviations DM diabetes mellitus; DME diabetic macular edema; PDR proliferative diabetic retinopathy; NPDR non proliferative diabetic retinopathy; Ex AMD exudative age related macular degeneration; Dry AMD  Dry age related macular degeneration; BRVO  Branch retinal vein occlusion; CRVO central retinal vein occlusion; ME macular edema; RD retinal detachment; RT retinal tear; SB scleral buckle; PPV pars plana vitrecctomy; OAG Open angle glaucoma; CE  IOL cataract extraction with intraocular lens;  PC IOL  posterior chamber  intraocular lens; VH  Vitreous hemorrhage; PRP  panretinal laser photocoagulation;  AC IOL anterior chamber intraocular lens IVK intravitreal kenalog; IVA intravitreal avastin ; IVL intravitreal Lucentis; IVE Intravitreal Eylea; MFC multifocal choroiditis; PVD posterior vitreous detachment; OD right eye; OS left eye; OU  Both eyes;  VMT  vitreomacular traction;  MI  Myocardial infarction;   COPD  Chronic obstructive pulmonary disease, CRF  Chronic renal failure; HTN  Hypertension; MH  Macular hole; ERM epiretinal membrane; NVD neovascularization of the disc,; NVE neovascularization elsewhere; AREDS age related eye disease study I agree the documentation is accurate and complete.  Electronically signed by: Lorrene Cheree MD 12/06/2023 12:02 PM

## 2023-12-06 DIAGNOSIS — H353231 Exudative age-related macular degeneration, bilateral, with active choroidal neovascularization: Secondary | ICD-10-CM | POA: Diagnosis not present

## 2023-12-14 ENCOUNTER — Other Ambulatory Visit: Payer: Self-pay | Admitting: Family Medicine

## 2023-12-14 ENCOUNTER — Other Ambulatory Visit: Payer: Self-pay | Admitting: Orthopedic Surgery

## 2023-12-14 DIAGNOSIS — I5032 Chronic diastolic (congestive) heart failure: Secondary | ICD-10-CM

## 2023-12-14 DIAGNOSIS — F419 Anxiety disorder, unspecified: Secondary | ICD-10-CM

## 2023-12-14 NOTE — Telephone Encounter (Signed)
Requesting: clorazepate 7.5mg   Contract: 08/22/21 UDS: 02/10/22 Last Visit: 10/08/23  Next Visit: 04/07/24 Last Refill: 10/08/23 #30 and 0RF   Please Advise

## 2023-12-28 ENCOUNTER — Other Ambulatory Visit: Payer: Self-pay | Admitting: Family Medicine

## 2023-12-28 DIAGNOSIS — E785 Hyperlipidemia, unspecified: Secondary | ICD-10-CM

## 2024-01-08 ENCOUNTER — Other Ambulatory Visit: Payer: Self-pay | Admitting: Orthopedic Surgery

## 2024-01-08 DIAGNOSIS — I5032 Chronic diastolic (congestive) heart failure: Secondary | ICD-10-CM

## 2024-01-09 ENCOUNTER — Other Ambulatory Visit: Payer: Self-pay | Admitting: Family Medicine

## 2024-01-09 DIAGNOSIS — G609 Hereditary and idiopathic neuropathy, unspecified: Secondary | ICD-10-CM

## 2024-01-11 ENCOUNTER — Telehealth: Payer: Self-pay | Admitting: Family Medicine

## 2024-01-11 DIAGNOSIS — G609 Hereditary and idiopathic neuropathy, unspecified: Secondary | ICD-10-CM

## 2024-01-11 MED ORDER — GABAPENTIN 100 MG PO CAPS
200.0000 mg | ORAL_CAPSULE | Freq: Every day | ORAL | 1 refills | Status: DC
Start: 2024-01-11 — End: 2024-04-07

## 2024-01-11 NOTE — Telephone Encounter (Signed)
Rx re-sent to [facility] 

## 2024-01-11 NOTE — Telephone Encounter (Signed)
Copied from CRM [id number]. Topic: Clinical - Prescription Issue >> Jan 11, 2024 10:03 AM Marica Otter wrote: Reason for CRM: Patient sates pharmacy on file states they don't have prescription refill for his gabapentin (NEURONTIN) 100 MG capsule, system shows receipt by pharmacy on 01/09/2024 and patient is out of medication.

## 2024-01-16 ENCOUNTER — Other Ambulatory Visit: Payer: Self-pay | Admitting: Family Medicine

## 2024-01-16 DIAGNOSIS — F419 Anxiety disorder, unspecified: Secondary | ICD-10-CM

## 2024-01-16 DIAGNOSIS — H401122 Primary open-angle glaucoma, left eye, moderate stage: Secondary | ICD-10-CM | POA: Diagnosis not present

## 2024-01-16 NOTE — Telephone Encounter (Signed)
Requesting: chlorazepate 7.5mg   Contract: None UDS:02/10/22 Last Visit: 10/08/23 Next Visit: 04/07/24 Last Refill: 12/14/23 # 30 and 0RF   Please Advise

## 2024-01-20 ENCOUNTER — Other Ambulatory Visit: Payer: Self-pay | Admitting: Orthopedic Surgery

## 2024-01-20 DIAGNOSIS — I5032 Chronic diastolic (congestive) heart failure: Secondary | ICD-10-CM

## 2024-01-24 ENCOUNTER — Other Ambulatory Visit: Payer: Self-pay | Admitting: Orthopedic Surgery

## 2024-01-24 ENCOUNTER — Other Ambulatory Visit: Payer: Self-pay | Admitting: Family Medicine

## 2024-01-24 DIAGNOSIS — I5032 Chronic diastolic (congestive) heart failure: Secondary | ICD-10-CM

## 2024-01-24 MED ORDER — TORSEMIDE 20 MG PO TABS
20.0000 mg | ORAL_TABLET | Freq: Every day | ORAL | 0 refills | Status: DC
Start: 2024-01-24 — End: 2024-02-15

## 2024-01-24 NOTE — Telephone Encounter (Signed)
Copied from CRM [id number]. Topic: Clinical - Medication Refill >> Jan 24, 2024  9:08 AM Drema Balzarine wrote: Most Recent Primary Care Visit:  Provider: Seabron Spates R  Department: [facility]  Visit Type: OFFICE VISIT  Date: 10/08/2023  Medication: Torsemide - 90 day supply please    Has the patient contacted their pharmacy? Yes, says an Amy Fargo filled med before? Patient not familiar with this nurse practioner - says he knows Dr.Lowne Chase filled this before    Is this the correct pharmacy for this prescription? Yes If no, delete pharmacy and type the correct one.  This is the patient's preferred pharmacy:   [id number] [place], [place] - [address] [place] [address] Phone: [phone] Fax: [phone]   Has the prescription been filled recently? Yes  Is the patient out of the medication? Yes, has been out for 2 days and he says he really needs this because he has history of heart failure and not taking meds effects his heart/lungs  Has the patient been seen for an appointment in the last year OR does the patient have an upcoming appointment? Yes  Can we respond through MyChart? No  Agent: Please be advised that Rx refills may take up to 3 business days. We ask that you follow-up with your pharmacy.

## 2024-02-07 DIAGNOSIS — H35362 Drusen (degenerative) of macula, left eye: Secondary | ICD-10-CM | POA: Diagnosis not present

## 2024-02-07 DIAGNOSIS — H401122 Primary open-angle glaucoma, left eye, moderate stage: Secondary | ICD-10-CM | POA: Diagnosis not present

## 2024-02-07 DIAGNOSIS — H353231 Exudative age-related macular degeneration, bilateral, with active choroidal neovascularization: Secondary | ICD-10-CM | POA: Diagnosis not present

## 2024-02-07 DIAGNOSIS — H35373 Puckering of macula, bilateral: Secondary | ICD-10-CM | POA: Diagnosis not present

## 2024-02-14 NOTE — Progress Notes (Signed)
Cardiology Office Note:  .    Date:  02/15/2024  ID:  Mark Stark., DOB [birth year], MRN [id number] PCP: Mark Stark, Grayling Congress, DO  [facility] Providers Cardiologist:  Jodelle Red, MD     History of Present Illness: .    Mark Fristoe. is a 89 y.o. male with a hx of permanent atrial fibrillation, chronic diastolic CHF, CKD stage 3a, hypertension, dyslipidemia, aortic insufficiency, mitral regurgitation, inferior mesenteric vein thrombosis, prostate cancer, and anxiety, who is seen for follow up.    Cardiovascular history: On presentation to the hospital 10/25/2021 he complained of worsening shortness of breath, LE edema, and orthopnea. CXR revealed small bilateral effusions and mild interstitial edema. He was admitted for congestive heart failure. We have struggled with balance of blood pressure, afib, and fluid balance.  Today: No specific concerns, although he feels fatigued/exhausted in the evenings. Notes that he can overcome this if he has somewhere to be/visit. Brings his BP and HR log today. BP has ranged 102-125/68-84. HR 49-107, most 60-70s. Weights have been steady, 147-151 lbs.  LE edema has been much improved on torsemide. Has not required more than once daily.  No lightheadedness/presyncope. No chest pain. No syncope or palpitations.   ROS:  Please see the history of present illness. ROS otherwise negative except as noted.   Studies Reviewed: [name]          Physical Exam:    VS:  BP 130/76   Pulse (!) 111   Ht 5\' 5"  (1.651 m)   Wt 154 lb (69.9 kg)   SpO2 97%   BMI 25.63 kg/m    Wt Readings from Last 3 Encounters:  02/15/24 154 lb (69.9 kg)  11/05/23 154 lb 6.4 oz (70 kg)  10/08/23 151 lb 12.8 oz (68.9 kg)    GEN: Well nourished, well developed in no acute distress HEENT: Normal, moist mucous membranes NECK: No JVD CARDIAC: irregularly irregular rhythm, normal S1 and S2, no rubs or gallops. 1/6 systolic murmur. VASCULAR: Radial and DP pulses  2+ bilaterally. No carotid bruits RESPIRATORY:  Clear to auscultation without rales, wheezing or rhonchi  ABDOMEN: Soft, non-tender, non-distended MUSCULOSKELETAL:  Ambulates independently SKIN: Warm and dry, no edema NEUROLOGIC:  Alert and oriented x 3. No focal neuro deficits noted. PSYCHIATRIC:  Normal affect    ASSESSMENT AND PLAN: .    Chronic diastolic heart failure Moderate to severe mitral regurgitation when volume overloaded -NYHA class II -echo with normal EF, moderate to severe MR at peak congestion -murmur much quieter when euvolemic -continue metoprolol succinate 12.5 mg daily, avoid bradycardia but also avoid tachycardia with afib, Heart rates at home much lower typically than in office -continue daily torsemide, has PRN instructions on when to take a second daily dose, has not required.  -now tolerating Jardiance, spironolactone. No BP room for ARB/ARNI. If BP drops to symptomatic lows in the future, would cut back/stop spironolactone -if symptoms become difficult to manage, would consider TEE to determine if mitraclip would be beneficial. He prefers to avoid procedures based on prior conversations, but if otherwise we cannot manage his symptoms, would re-discuss   Atrial fibrillation, permanent Atypical atrial flutter -CHA2DS2/VAS Stroke Risk Points=4  -continue apixaban 5 mg BID. Meets age criteria, monitor renal function. Weight is >60 kg -did not hold rhythm with cardioversion or amiodarone followed by cardioversion. Asymptomatic. No further plans for rhythm control -has been a balance between optimizing heart rate and preventing hypotension. Discussed options, we will  continue current dose of metoprolol    Hyperlipidemia -continue simvastatin, reasonable for age -on fenofibrate as well   Dispo: Follow-up in 3 months, or sooner as needed.  Signed, Jodelle Red, MD

## 2024-02-15 ENCOUNTER — Ambulatory Visit (HOSPITAL_BASED_OUTPATIENT_CLINIC_OR_DEPARTMENT_OTHER): Payer: Medicare Other | Admitting: Cardiology

## 2024-02-15 ENCOUNTER — Encounter (HOSPITAL_BASED_OUTPATIENT_CLINIC_OR_DEPARTMENT_OTHER): Payer: Self-pay | Admitting: Cardiology

## 2024-02-15 VITALS — BP 130/76 | HR 111 | Ht 65.0 in | Wt 154.0 lb

## 2024-02-15 DIAGNOSIS — I5032 Chronic diastolic (congestive) heart failure: Secondary | ICD-10-CM

## 2024-02-15 DIAGNOSIS — Z7901 Long term (current) use of anticoagulants: Secondary | ICD-10-CM

## 2024-02-15 DIAGNOSIS — I340 Nonrheumatic mitral (valve) insufficiency: Secondary | ICD-10-CM

## 2024-02-15 DIAGNOSIS — D6869 Other thrombophilia: Secondary | ICD-10-CM | POA: Diagnosis not present

## 2024-02-15 DIAGNOSIS — I4821 Permanent atrial fibrillation: Secondary | ICD-10-CM | POA: Diagnosis not present

## 2024-02-15 MED ORDER — TORSEMIDE 20 MG PO TABS: 20.0000 mg | ORAL_TABLET | Freq: Every day | ORAL | 3 refills | Status: DC

## 2024-02-15 NOTE — Patient Instructions (Signed)
Medication Instructions:  Your physician recommends that you continue on your current medications as directed. Please refer to the Current Medication list given to you today.  Follow-Up: At [facility], you and your health needs are our priority.  As part of our continuing mission to provide you with exceptional heart care, we have created designated Provider Care Teams.  These Care Teams include your primary Cardiologist (physician) and Advanced Practice Providers (APPs -  Physician Assistants and Nurse Practitioners) who all work together to provide you with the care you need, when you need it.  We recommend signing up for the patient portal called "MyChart".  Sign up information is provided on this After Visit Summary.  MyChart is used to connect with patients for Virtual Visits (Telemedicine).  Patients are able to view lab/test results, encounter notes, upcoming appointments, etc.  Non-urgent messages can be sent to your provider as well.   To learn more about what you can do with MyChart, go to [email or web address].    Your next appointment:   3 months with Gillian Shields, NP or Eligha Bridegroom, NP 6 months with Dr. Cristal Deer

## 2024-02-17 ENCOUNTER — Other Ambulatory Visit: Payer: Self-pay | Admitting: Orthopedic Surgery

## 2024-02-17 ENCOUNTER — Other Ambulatory Visit: Payer: Self-pay | Admitting: Family Medicine

## 2024-02-17 DIAGNOSIS — J309 Allergic rhinitis, unspecified: Secondary | ICD-10-CM

## 2024-02-17 DIAGNOSIS — I5032 Chronic diastolic (congestive) heart failure: Secondary | ICD-10-CM

## 2024-02-17 DIAGNOSIS — F419 Anxiety disorder, unspecified: Secondary | ICD-10-CM

## 2024-02-18 NOTE — Telephone Encounter (Signed)
 Requesting: Tranxene Contract: 2023 UDS: 2023 Last OV: 10/08/23 Next OV: 04/07/24 Last Refill: 01/17/24, #30--0 RF Database:   Please advise

## 2024-02-21 ENCOUNTER — Ambulatory Visit: Payer: Medicare Other | Admitting: *Deleted

## 2024-02-21 VITALS — BP 120/70 | HR 108 | Ht 65.0 in | Wt 155.2 lb

## 2024-02-21 DIAGNOSIS — Z0000 Encounter for general adult medical examination without abnormal findings: Secondary | ICD-10-CM

## 2024-02-21 NOTE — Patient Instructions (Signed)
 Mark Stark , Thank you for taking time to come for your Medicare Wellness Visit. I appreciate your ongoing commitment to your health goals. Please review the following plan we discussed and let me know if I can assist you in the future.     This is a list of the screening recommended for you and due dates:  Health Maintenance  Topic Date Due   COVID-19 Vaccine (8 - 2024-25 season) 08/26/2023   Medicare Annual Wellness Visit  02/20/2025   DTaP/Tdap/Td vaccine (3 - Td or Tdap) 12/01/2032   Pneumonia Vaccine  Completed   Flu Shot  Completed   Zoster (Shingles) Vaccine  Completed   HPV Vaccine  Aged Out     Next appointment: Follow up in one year for your annual wellness visit.   Preventive Care 6 Years and Older, Male Preventive care refers to lifestyle choices and visits with your health care provider that can promote health and wellness. What does preventive care include? A yearly physical exam. This is also called an annual well check. Dental exams once or twice a year. Routine eye exams. Ask your health care provider how often you should have your eyes checked. Personal lifestyle choices, including: Daily care of your teeth and gums. Regular physical activity. Eating a healthy diet. Avoiding tobacco and drug use. Limiting alcohol use. Practicing safe sex. Taking low doses of aspirin every day. Taking vitamin and mineral supplements as recommended by your health care provider. What happens during an annual well check? The services and screenings done by your health care provider during your annual well check will depend on your age, overall health, lifestyle risk factors, and family history of disease. Counseling  Your health care provider may ask you questions about your: Alcohol use. Tobacco use. Drug use. Emotional well-being. Home and relationship well-being. Sexual activity. Eating habits. History of falls. Memory and ability to understand (cognition). Work and  work Astronomer. Screening  You may have the following tests or measurements: Height, weight, and BMI. Blood pressure. Lipid and cholesterol levels. These may be checked every 5 years, or more frequently if you are over 52 years old. Skin check. Lung cancer screening. You may have this screening every year starting at age 76 if you have a 30-pack-year history of smoking and currently smoke or have quit within the past 15 years. Fecal occult blood test (FOBT) of the stool. You may have this test every year starting at age 35. Flexible sigmoidoscopy or colonoscopy. You may have a sigmoidoscopy every 5 years or a colonoscopy every 10 years starting at age 35. Prostate cancer screening. Recommendations will vary depending on your family history and other risks. Hepatitis C blood test. Hepatitis B blood test. Sexually transmitted disease (STD) testing. Diabetes screening. This is done by checking your blood sugar (glucose) after you have not eaten for a while (fasting). You may have this done every 1-3 years. Abdominal aortic aneurysm (AAA) screening. You may need this if you are a current or former smoker. Osteoporosis. You may be screened starting at age 66 if you are at high risk. Talk with your health care provider about your test results, treatment options, and if necessary, the need for more tests. Vaccines  Your health care provider may recommend certain vaccines, such as: Influenza vaccine. This is recommended every year. Tetanus, diphtheria, and acellular pertussis (Tdap, Td) vaccine. You may need a Td booster every 10 years. Zoster vaccine. You may need this after age 81. Pneumococcal 13-valent conjugate (PCV13)  vaccine. One dose is recommended after age 32. Pneumococcal polysaccharide (PPSV23) vaccine. One dose is recommended after age 71. Talk to your health care provider about which screenings and vaccines you need and how often you need them. This information is not intended to  replace advice given to you by your health care provider. Make sure you discuss any questions you have with your health care provider. Document Released: 01/07/2016 Document Revised: 08/30/2016 Document Reviewed: 10/12/2015 Elsevier Interactive Patient Education  2017 [employer].  Fall Prevention in the Home Falls can cause injuries. They can happen to people of all ages. There are many things you can do to make your home safe and to help prevent falls. What can I do on the outside of my home? Regularly fix the edges of walkways and driveways and fix any cracks. Remove anything that might make you trip as you walk through a door, such as a raised step or threshold. Trim any bushes or trees on the path to your home. Use bright outdoor lighting. Clear any walking paths of anything that might make someone trip, such as rocks or tools. Regularly check to see if handrails are loose or broken. Make sure that both sides of any steps have handrails. Any raised decks and porches should have guardrails on the edges. Have any leaves, snow, or ice cleared regularly. Use sand or salt on walking paths during winter. Clean up any spills in your garage right away. This includes oil or grease spills. What can I do in the bathroom? Use night lights. Install grab bars by the toilet and in the tub and shower. Do not use towel bars as grab bars. Use non-skid mats or decals in the tub or shower. If you need to sit down in the shower, use a plastic, non-slip stool. Keep the floor dry. Clean up any water that spills on the floor as soon as it happens. Remove soap buildup in the tub or shower regularly. Attach bath mats securely with double-sided non-slip rug tape. Do not have throw rugs and other things on the floor that can make you trip. What can I do in the bedroom? Use night lights. Make sure that you have a light by your bed that is easy to reach. Do not use any sheets or blankets that are too big for  your bed. They should not hang down onto the floor. Have a firm chair that has side arms. You can use this for support while you get dressed. Do not have throw rugs and other things on the floor that can make you trip. What can I do in the kitchen? Clean up any spills right away. Avoid walking on wet floors. Keep items that you use a lot in easy-to-reach places. If you need to reach something above you, use a strong step stool that has a grab bar. Keep electrical cords out of the way. Do not use floor polish or wax that makes floors slippery. If you must use wax, use non-skid floor wax. Do not have throw rugs and other things on the floor that can make you trip. What can I do with my stairs? Do not leave any items on the stairs. Make sure that there are handrails on both sides of the stairs and use them. Fix handrails that are broken or loose. Make sure that handrails are as long as the stairways. Check any carpeting to make sure that it is firmly attached to the stairs. Fix any carpet that is loose  or worn. Avoid having throw rugs at the top or bottom of the stairs. If you do have throw rugs, attach them to the floor with carpet tape. Make sure that you have a light switch at the top of the stairs and the bottom of the stairs. If you do not have them, ask someone to add them for you. What else can I do to help prevent falls? Wear shoes that: Do not have high heels. Have rubber bottoms. Are comfortable and fit you well. Are closed at the toe. Do not wear sandals. If you use a stepladder: Make sure that it is fully opened. Do not climb a closed stepladder. Make sure that both sides of the stepladder are locked into place. Ask someone to hold it for you, if possible. Clearly mark and make sure that you can see: Any grab bars or handrails. First and last steps. Where the edge of each step is. Use tools that help you move around (mobility aids) if they are needed. These  include: Canes. Walkers. Scooters. Crutches. Turn on the lights when you go into a dark area. Replace any light bulbs as soon as they burn out. Set up your furniture so you have a clear path. Avoid moving your furniture around. If any of your floors are uneven, fix them. If there are any pets around you, be aware of where they are. Review your medicines with your doctor. Some medicines can make you feel dizzy. This can increase your chance of falling. Ask your doctor what other things that you can do to help prevent falls. This information is not intended to replace advice given to you by your health care provider. Make sure you discuss any questions you have with your health care provider. Document Released: 10/07/2009 Document Revised: 05/18/2016 Document Reviewed: 01/15/2015 Elsevier Interactive Patient Education  2017 [employer].

## 2024-02-21 NOTE — Progress Notes (Signed)
 Subjective:   Mark Stark. is a 89 y.o. male who presents for Medicare Annual/Subsequent preventive examination.  Visit Complete: In person  Cardiac Risk Factors include: advanced age (>71men, >39 women);dyslipidemia;hypertension;male gender     Objective:    Today's Vitals   02/21/24 1047  BP: 120/70  Pulse: (!) 108  Weight: 155 lb 3.2 oz (70.4 kg)  Height: 5\' 5"  (1.651 m)   Body mass index is 25.83 kg/m.     02/21/2024   10:48 AM 07/09/2023   10:58 AM 06/27/2023   12:04 PM 05/25/2023    6:48 PM 02/19/2023    1:01 PM 11/24/2022   11:18 AM 08/23/2022    2:00 PM  Advanced Directives  Does Patient Have a Medical Advance Directive? Yes Yes Yes No;Yes Yes No Yes  Type of Estate agent of [place];Living will Living will;Healthcare Power of [employer] Power of [place];Living will Healthcare Power of [place];Living will Healthcare Power of [place];Living will  Living will  Does patient want to make changes to medical advance directive? No - Patient declined No - Patient declined No - Patient declined No - Patient declined No - Patient declined  No - Patient declined  Copy of Healthcare Power of Attorney in Chart? No - copy requested Yes - validated most recent copy scanned in chart (See row information) Yes - validated most recent copy scanned in chart (See row information) No - copy requested Yes - validated most recent copy scanned in chart (See row information)    Would patient like information on creating a medical advance directive?   No - Patient declined No - Patient declined       Current Medications (verified) Outpatient Encounter Medications as of 02/21/2024  Medication Sig   acetaminophen (TYLENOL) 325 MG tablet Take 2 tablets (650 mg total) by mouth every 6 (six) hours as needed for mild pain or moderate pain (or Fever >/= 101).   apixaban (ELIQUIS) 5 MG TABS tablet Take 1 tablet (5 mg total) by mouth 2 (two) times daily.   b complex  vitamins tablet Take 1 tablet by mouth daily.   Calcium Carbonate-Vitamin D 600-400 MG-UNIT tablet Take 1 tablet by mouth daily.   clorazepate (TRANXENE) 7.5 MG tablet TAKE 1 TABLET (7.5 MG TOTAL) BY MOUTH 2 (TWO) TIMES DAILY AS NEEDED FOR ANXIETY.   cromolyn (NASALCROM) 5.2 MG/ACT nasal spray PLACE 2 SPRAYS INTO BOTH NOSTRILS AT BEDTIME.   fenofibrate 160 MG tablet Take 1 tablet (160 mg total) by mouth daily.   fish oil-omega-3 fatty acids 1000 MG capsule Take 1 g by mouth 2 (two) times daily.   gabapentin (NEURONTIN) 100 MG capsule Take 2 capsules (200 mg total) by mouth at bedtime.   JARDIANCE 10 MG TABS tablet TAKE 1 TABLET BY MOUTH EVERY DAY   latanoprost (XALATAN) 0.005 % ophthalmic solution Place 1 drop into the left eye at bedtime.   magic mouthwash w/lidocaine SOLN Take 5 mLs by mouth 4 (four) times daily as needed for mouth pain. Swish and Spit   metoprolol succinate (TOPROL-XL) 25 MG 24 hr tablet Take 12.5 mg by mouth daily.   Multiple Vitamin (THEREMS PO) Take 1 tablet by mouth daily.   Multiple Vitamins-Minerals (PRESERVISION AREDS 2) CAPS 2 po qd   simvastatin (ZOCOR) 10 MG tablet Take 1 tablet (10 mg total) by mouth at bedtime.   spironolactone (ALDACTONE) 25 MG tablet Take 0.5 tablets (12.5 mg total) by mouth daily.   tamsulosin (FLOMAX) 0.4  MG CAPS capsule Take 1 capsule (0.4 mg total) by mouth daily.   torsemide (DEMADEX) 20 MG tablet Take 1 tablet (20 mg total) by mouth daily. In case of weight gain 2 to 3 lbs in 24 hr or 5 lbs in 7 days take 2 tablets daily until weight back to baseline.   vitamin C (ASCORBIC ACID) 500 MG tablet Take 500 mg by mouth daily.   [DISCONTINUED] mirtazapine (REMERON) 7.5 MG tablet Take 1 tablet (7.5 mg total) by mouth at bedtime. (Patient not taking: Reported on 02/15/2024)   No facility-administered encounter medications on file as of 02/21/2024.    Allergies (verified) Alpha-gal and Sulfonamide derivatives   History: Past Medical History:   Diagnosis Date   Acute diastolic CHF (congestive heart failure) (HCC) 10/25/2021   Anxiety    Aortic insufficiency    Atrial fibrillation (HCC)    Chronic diastolic CHF (congestive heart failure) (HCC)    Chronic kidney disease, stage 3a (HCC)    Diverticulitis    Dyslipidemia    Hypertension    Inferior mesenteric vein thrombosis (HCC)    Mitral regurgitation    Nonrheumatic mitral valve regurgitation 10/28/2021   Paroxysmal atrial fibrillation (HCC) 01/12/2018   Persistent atrial fibrillation (HCC)    Prostate cancer [facility])    Past Surgical History:  Procedure Laterality Date   CARDIOVERSION N/A 03/01/2018   Procedure: CARDIOVERSION;  Surgeon: Lennette Bihari, MD;  Location: [facility] END[facility]COPY;  Service: Cardiovascular;  Laterality: N/A;   CARDIOVERSION N/A 08/26/2021   Procedure: CARDIOVERSION;  Surgeon: Chrystie Nose, MD;  Location: [facility] ENDOSCOPY;  Service: Cardiovascular;  Laterality: N/A;   CARDIOVERSION N/A 12/06/2021   Procedure: CARDIOVERSION;  Surgeon: Little Ishikawa, MD;  Location: [facility] ENDOSCOPY;  Service: Cardiovascular;  Laterality: N/A;   CATARACT EXTRACTION  01/27/2010   right   CATARACT EXTRACTION  03/04/2010   left   Prostate seed implants     PROSTATE SURGERY     Family History  Problem Relation Age of Onset   Dementia Mother    Alzheimer's disease Mother    Mental illness Mother        ALZ DISEASE   Cancer Brother 43       pancreatic cancer   Social History   Socioeconomic History   Marital status: Widowed    Spouse name: Not on file   Number of children: Not on file   Years of education: Not on file   Highest education level: Not on file  Occupational History   Occupation: Professor---RETIRED    Employer: RETIRED    Comment: Retired  Tobacco Use   Smoking status: Never   Smokeless tobacco: Never  Vaping Use   Vaping status: Never Used  Substance and Sexual Activity   Alcohol use: No    Comment: Little wine occasionally    Drug use: No    Sexual activity: Never    Partners: Female  Other Topics Concern   Not on file  Social History Narrative   Not on file   Social Drivers of Health   Financial Resource Strain: Low Risk  (02/21/2024)   Overall Financial Resource Strain (CARDIA)    Difficulty of Paying Living Expenses: Not hard at all  Food Insecurity: No Food Insecurity (02/21/2024)   Hunger Vital Sign    Worried About Running Out of Food in the Last Year: Never true    Ran Out of Food in the Last Year: Never true  Transportation Needs: No Transportation Needs (02/21/2024)  PRAPARE - Administrator, Civil Service (Medical): No    Lack of Transportation (Non-Medical): No  Physical Activity: Inactive (02/21/2024)   Exercise Vital Sign    Days of Exercise per Week: 0 days    Minutes of Exercise per Session: 0 min  Stress: No Stress Concern Present (02/21/2024)   [employer] of Occupational Health - Occupational Stress Questionnaire    Feeling of Stress : Only a little  Social Connections: Moderately Isolated (02/21/2024)   Social Connection and Isolation Panel [NHANES]    Frequency of Communication with Friends and Family: More than three times a week    Frequency of Social Gatherings with Friends and Family: Three times a week    Attends Religious Services: More than 4 times per year    Active Member of Clubs or Organizations: No    Attends Banker Meetings: Never    Marital Status: Widowed    Tobacco Counseling Counseling given: Not Answered   Clinical Intake:  Pre-visit preparation completed: Yes  Pain : No/denies pain  BMI - recorded: 25.83 Nutritional Status: BMI 25 -29 Overweight Nutritional Risks: None Diabetes: No  How often do you need to have someone help you when you read instructions, pamphlets, or other written materials from your doctor or pharmacy?: 1 - Never  Interpreter Needed?: No  Information entered by :: Donne Anon, CMA   Activities of Daily  Living    02/21/2024   10:51 AM 05/26/2023    2:59 AM  In your present state of health, do you have any difficulty performing the following activities:  Hearing? 1 0  Comment wears hearing aids   Vision? 1 0  Difficulty concentrating or making decisions? 0 0  Walking or climbing stairs? 0 1  Dressing or bathing? 0 1  Doing errands, shopping? 0 0  Preparing Food and eating ? N   Using the Toilet? N   In the past six months, have you accidently leaked urine? N   Do you have problems with loss of bowel control? Y   Comment occasionally after a large meal   Managing your Medications? N   Managing your Finances? N   Housekeeping or managing your Housekeeping? N     Patient Care Team: Zola Button, Grayling Congress, DO as PCP - General Jodelle Red, MD as PCP - Cardiology (Cardiology) Marvis Repress, MD as Referring Physician (Ophthalmology) Cherlyn Roberts, MD as Consulting Physician (Dermatology) Bond, Doran Stabler, MD as Referring Physician (Ophthalmology) Bjorn Pippin, MD as Attending Physician (Urology)  Indicate any recent Medical Services you may have received from other than [facility] providers in the past year (date may be approximate).     Assessment:   This is a routine wellness examination for [place].  Hearing/Vision screen No results found.   Goals Addressed   None    Depression Screen    02/21/2024   11:15 AM 10/08/2023   11:39 AM 07/04/2023   10:56 AM 05/15/2023    9:42 AM 04/03/2023    1:14 PM 02/19/2023    1:11 PM 01/24/2023   11:51 AM  PHQ 2/9 Scores  PHQ - 2 Score 0 0 2 2 2  0 2  PHQ- 9 Score   6 6 6  4     Fall Risk    02/21/2024   11:04 AM 10/08/2023   11:39 AM 02/19/2023    1:06 PM 02/09/2023    1:25 PM 12/01/2022    2:42 PM  Fall Risk  Falls in the past year? 1 1 1 1 1   Comment   tripped over [place] in patio    Number falls in past yr: 0 1 0 0 0  Injury with Fall? 0 1 1 1  0  Risk for fall due to : History of fall(s)  History of fall(s) Impaired  balance/gait;Impaired mobility Impaired balance/gait  Follow up Falls evaluation completed Falls evaluation completed Falls evaluation completed Falls evaluation completed Falls evaluation completed    MEDICARE RISK AT HOME: Medicare Risk at Home Any stairs in or around the home?: Yes If so, are there any without handrails?: No Home free of loose throw rugs in walkways, pet beds, electrical cords, etc?: Yes Adequate lighting in your home to reduce risk of falls?: Yes Life alert?: Yes Use of a cane, walker or w/c?: No Grab bars in the bathroom?: Yes Shower chair or bench in shower?: No Elevated toilet seat or a handicapped toilet?: No  TIMED UP AND GO:  Was the test performed?  Yes  Length of time to ambulate 10 feet: 9 sec Gait slow and steady without use of assistive device    Cognitive Function:    01/14/2019    1:33 PM 12/22/2016    2:05 PM  MMSE - Mini Mental State Exam  Orientation to time 5 5  Orientation to Place 5 5  Registration 3 3  Attention/ Calculation 5 5  Recall 3 3  Language- name 2 objects 2 2  Language- repeat 1 1  Language- follow 3 step command 3 3  Language- read & follow direction 1 1  Write a sentence 1 1  Copy design 1 1  Total score 30 30        02/21/2024   11:17 AM 02/19/2023    1:28 PM  6CIT Screen  What Year? 0 points 0 points  What month? 0 points 0 points  What time? 0 points 0 points  Count back from 20 0 points 0 points  Months in reverse 0 points 0 points  Repeat phrase 2 points 4 points  Total Score 2 points 4 points    Immunizations Immunization History  Administered Date(s) Administered   Influenza Whole 11/14/2007, 09/30/2008, 09/08/2009, 08/31/2010, 09/03/2013   Influenza, High Dose Seasonal PF 08/19/2015, 09/07/2017, 08/05/2019, 09/28/2022, 09/04/2023   Influenza-Unspecified 09/07/2014, 09/15/2016, 08/03/2020   PFIZER Comirnaty(Gray Top)Covid-19 Tri-Sucrose Vaccine 04/11/2021   PFIZER(Purple Top)SARS-COV-2 Vaccination  01/18/2020, 02/09/2020, 09/14/2020   Pfizer Covid-19 Vaccine Bivalent Booster 40yrs & up 10/04/2021, 05/15/2022   Pneumococcal Conjugate-13 09/07/2014   Pneumococcal Polysaccharide-23 09/22/2009   Respiratory Syncytial Virus Vaccine,Recomb Aduvanted(Arexvy) 09/13/2023   Td 12/01/2022   Tdap 12/25/2012   Unspecified SARS-COV-2 Vaccination 09/28/2022   Zoster Recombinant(Shingrix) 02/17/2022, 05/15/2022   Zoster, Live 03/19/2008    TDAP status: Up to date  Flu Vaccine status: Up to date  Pneumococcal vaccine status: Up to date  Covid-19 vaccine status: Information provided on how to obtain vaccines.   Qualifies for Shingles Vaccine? Yes   Zostavax completed Yes   Shingrix Completed?: Yes  Screening Tests Health Maintenance  Topic Date Due   COVID-19 Vaccine (8 - 2024-25 season) 08/26/2023   Medicare Annual Wellness (AWV)  02/20/2024   DTaP/Tdap/Td (3 - Td or Tdap) 12/01/2032   Pneumonia Vaccine 29+ Years old  Completed   INFLUENZA VACCINE  Completed   Zoster Vaccines- Shingrix  Completed   HPV VACCINES  Aged Out    Health Maintenance  Health Maintenance Due  Topic Date Due  COVID-19 Vaccine (8 - 2024-25 season) 08/26/2023   Medicare Annual Wellness (AWV)  02/20/2024    Colorectal cancer screening: No longer required.   Lung Cancer Screening: (Low Dose CT Chest recommended if Age 68-80 years, 20 pack-year currently smoking OR have quit w/in 15years.) does not qualify.   Additional Screening:  Hepatitis C Screening: does not qualify  Vision Screening: Recommended annual ophthalmology exams for early detection of glaucoma and other disorders of the eye. Is the patient up to date with their annual eye exam?  Yes  Who is the provider or what is the name of the office in which the patient attends annual eye exams? Dr. Marvis Repress If pt is not established with a provider, would they like to be referred to a provider to establish care? No .   Dental Screening:  Recommended annual dental exams for proper oral hygiene  Diabetic Foot Exam: N/a  Community Resource Referral / Chronic Care Management: CRR required this visit?  No   CCM required this visit?  No     Plan:     I have personally reviewed and noted the following in the patient's chart:   Medical and social history Use of alcohol, tobacco or illicit drugs  Current medications and supplements including opioid prescriptions. Patient is not currently taking opioid prescriptions. Functional ability and status Nutritional status Physical activity Advanced directives List of other physicians Hospitalizations, surgeries, and ER visits in previous 12 months Vitals Screenings to include cognitive, depression, and falls Referrals and appointments  In addition, I have reviewed and discussed with patient certain preventive protocols, quality metrics, and best practice recommendations. A written personalized care plan for preventive services as well as general preventive health recommendations were provided to patient.     Donne Anon, CMA   02/21/2024   After Visit Summary: (In Person-Declined) Patient declined AVS at this time.  Nurse Notes: None

## 2024-03-18 ENCOUNTER — Other Ambulatory Visit: Payer: Self-pay | Admitting: Family Medicine

## 2024-03-18 DIAGNOSIS — F419 Anxiety disorder, unspecified: Secondary | ICD-10-CM

## 2024-03-18 NOTE — Telephone Encounter (Signed)
 Requesting: clorazepate 7.5mg   Contract:08/22/21 UDS: 02/10/22 Last Visit: 10/08/23 Next Visit: 04/07/24 Last Refill: 02/19/24 #30 and 0RF   Please Advise

## 2024-03-26 DIAGNOSIS — C61 Malignant neoplasm of prostate: Secondary | ICD-10-CM | POA: Diagnosis not present

## 2024-04-02 DIAGNOSIS — R351 Nocturia: Secondary | ICD-10-CM | POA: Diagnosis not present

## 2024-04-02 DIAGNOSIS — C61 Malignant neoplasm of prostate: Secondary | ICD-10-CM | POA: Diagnosis not present

## 2024-04-02 DIAGNOSIS — N401 Enlarged prostate with lower urinary tract symptoms: Secondary | ICD-10-CM | POA: Diagnosis not present

## 2024-04-03 DIAGNOSIS — H353231 Exudative age-related macular degeneration, bilateral, with active choroidal neovascularization: Secondary | ICD-10-CM | POA: Diagnosis not present

## 2024-04-07 ENCOUNTER — Ambulatory Visit (INDEPENDENT_AMBULATORY_CARE_PROVIDER_SITE_OTHER): Payer: Medicare Other | Admitting: Family Medicine

## 2024-04-07 ENCOUNTER — Encounter: Payer: Self-pay | Admitting: Family Medicine

## 2024-04-07 VITALS — BP 128/88 | HR 117 | Temp 98.1°F | Resp 16 | Ht 65.0 in | Wt 156.2 lb

## 2024-04-07 DIAGNOSIS — G609 Hereditary and idiopathic neuropathy, unspecified: Secondary | ICD-10-CM | POA: Diagnosis not present

## 2024-04-07 DIAGNOSIS — Z789 Other specified health status: Secondary | ICD-10-CM

## 2024-04-07 DIAGNOSIS — M25512 Pain in left shoulder: Secondary | ICD-10-CM | POA: Diagnosis not present

## 2024-04-07 DIAGNOSIS — G8929 Other chronic pain: Secondary | ICD-10-CM

## 2024-04-07 DIAGNOSIS — I480 Paroxysmal atrial fibrillation: Secondary | ICD-10-CM | POA: Diagnosis not present

## 2024-04-07 DIAGNOSIS — E785 Hyperlipidemia, unspecified: Secondary | ICD-10-CM | POA: Diagnosis not present

## 2024-04-07 DIAGNOSIS — I10 Essential (primary) hypertension: Secondary | ICD-10-CM | POA: Diagnosis not present

## 2024-04-07 DIAGNOSIS — M25511 Pain in right shoulder: Secondary | ICD-10-CM

## 2024-04-07 LAB — COMPREHENSIVE METABOLIC PANEL WITH GFR
ALT: 18 U/L (ref 0–53)
AST: 33 U/L (ref 0–37)
Albumin: 4.5 g/dL (ref 3.5–5.2)
Alkaline Phosphatase: 53 U/L (ref 39–117)
BUN: 27 mg/dL — ABNORMAL HIGH (ref 6–23)
CO2: 29 meq/L (ref 19–32)
Calcium: 9.8 mg/dL (ref 8.4–10.5)
Chloride: 99 meq/L (ref 96–112)
Creatinine, Ser: 1.65 mg/dL — ABNORMAL HIGH (ref 0.40–1.50)
GFR: 35.51 mL/min — ABNORMAL LOW (ref >60.00)
Glucose, Bld: 92 mg/dL (ref 70–99)
Potassium: 4.8 meq/L (ref 3.5–5.1)
Sodium: 138 meq/L (ref 135–145)
Total Bilirubin: 1.4 mg/dL — ABNORMAL HIGH (ref 0.2–1.2)
Total Protein: 6.6 g/dL (ref 6.0–8.3)

## 2024-04-07 LAB — LIPID PANEL
Cholesterol: 111 mg/dL (ref 0–200)
HDL: 47.2 mg/dL (ref >39.00)
LDL Cholesterol: 49 mg/dL (ref 0–99)
NonHDL: 64.29
Total CHOL/HDL Ratio: 2
Triglycerides: 75 mg/dL (ref 0.0–149.0)
VLDL: 15 mg/dL (ref 0.0–40.0)

## 2024-04-07 MED ORDER — SIMVASTATIN 10 MG PO TABS: 10.0000 mg | ORAL_TABLET | Freq: Every day | ORAL | 1 refills | Status: DC

## 2024-04-07 MED ORDER — APIXABAN 5 MG PO TABS
5.0000 mg | ORAL_TABLET | Freq: Two times a day (BID) | ORAL | 1 refills | Status: DC
Start: 2024-04-07 — End: 2024-08-15

## 2024-04-07 MED ORDER — GABAPENTIN 100 MG PO CAPS: 200.0000 mg | ORAL_CAPSULE | Freq: Every day | ORAL | 1 refills | Status: DC

## 2024-04-07 NOTE — Progress Notes (Signed)
 Established Patient Office Visit  Subjective   Patient ID: Mark Printy., male    DOB: February 21, 1928  Age: 89 y.o. MRN: [id number]  Chief Complaint  Patient presents with   Hyperlipidemia   Follow-up    HPI Discussed the use of AI scribe software for clinical note transcription with the patient, who gave verbal consent to proceed.  History of Present Illness Mark Millon. is a 89 year old male with atrial fibrillation and hypertension who presents with concerns about low systolic blood pressure and shoulder pain.  He has been experiencing low systolic blood pressure for the past two weeks, which he associates with low energy levels. His diastolic pressure remains stable. He takes spironolactone at half a tablet daily and has stopped metoprolol, suspecting it contributed to the low blood pressure. He also takes torsemide daily and Jardiance for heart protection. Physical activity, such as gardening, seems to improve his energy levels. He is concerned about the potential impact of his low blood pressure on his atrial fibrillation management.  He describes bilateral shoulder pain following falls, one occurring two years ago and another last fall. The pain is more pronounced in the right shoulder, especially when sitting or sleeping in certain positions. He can perform activities like gardening without pain but experiences discomfort in specific positions. He has not seen an orthopedic specialist for this issue. The pain is intermittent and manageable, but it affects his ability to find comfortable sleeping positions.  He inquires about his kidney function, noting that he did not receive a detailed report from his last blood work. He assumes there would have been communication if there were any issues. He tries to maintain hydration, especially considering the diuretic effect of torsemide.  He expresses concern about his hearing aids, which occasionally malfunction, and inquires about the need  for a measles vaccine, noting he does not recall receiving one in childhood.   Patient Active Problem List   Diagnosis Date Noted   Fall 06/29/2023   Allergy to alpha-gal 06/29/2023   Peripheral neuropathy 06/29/2023   Bilateral hand pain 06/11/2023   Insomnia 06/11/2023   Pilonidal cyst 06/11/2023   Allergic reaction to alpha-gal 06/11/2023   Numbness in both hands 06/01/2023   Hyponatremia 05/25/2023   Cellulitis of right lower extremity 05/22/2023   Tick bite of right axilla with infection 05/15/2023   SOB (shortness of breath) 05/15/2023   Hematoma of left lower leg 01/05/2023   Acute on chronic diastolic CHF (congestive heart failure) (HCC) 08/24/2022   BPH (benign prostatic hyperplasia) 08/23/2022   Generalized weakness 08/22/2022   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   Diverticulitis 08/09/2022   Chronic kidney disease, stage 3a (HCC) 08/09/2022   Primary open angle glaucoma (POAG) of left eye, moderate stage 06/08/2022   Contusion of left orbit 01/05/2022   Chronic diastolic CHF (congestive heart failure) (HCC) 11/09/2021   Nonrheumatic mitral valve regurgitation 10/28/2021   Acute kidney injury superimposed on CKD (HCC) 10/28/2021   Acute diastolic CHF (congestive heart failure) (HCC) 10/25/2021   Secondary hypercoagulable state (HCC) 08/23/2021   Prostate CA (HCC) 07/28/2021   Thrombocytopenia (HCC) 03/30/2021   Screening for HIV (human immunodeficiency virus) 03/30/2021   Monocytosis 03/30/2021   Abnormal platelets (HCC) 03/28/2021   Hip pain 03/28/2021   Dry eyes, bilateral 05/27/2020   Epiretinal membrane (ERM) of both eyes 12/03/2019   Persistent atrial fibrillation (HCC)    Paroxysmal atrial fibrillation (HCC) 01/12/2018   Cystoid macular edema of  right eye 11/29/2017   Macular degeneration 12/21/2015   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 eye with active choroidal neovascularization (HCC) 04/30/2014   Myalgia and myositis  11/16/2013   Epiretinal membrane 11/07/2012   Posterior vitreous detachment 11/07/2012   Status post intraocular lens implant 11/07/2012   Fungal ear infection 04/09/2012   Abnormal laboratory test 03/27/2011   Aortic insufficiency 03/27/2011   SYSTOLIC MURMUR 03/05/2011   EDEMA 12/05/2010   ECCHYMOSES, SPONTANEOUS 11/28/2010   CONTUSION, RIGHT THIGH 11/28/2010   DECREASED HEARING, BILATERAL 09/23/2010   Pain in limb 07/28/2009   ANKLE SPRAIN, LEFT 07/28/2009   CONTUSION, LOWER LEG, LEFT 07/28/2009   DERMATITIS, CONTACT, NOS 10/02/2007   Essential hypertension 05/07/2007   PROSTATE CANCER, HX OF 03/13/2007   Hyperlipidemia 01/15/2007   Anxiety 01/15/2007   H/O oral aphthous ulcers 01/15/2007   Past Medical History:  Diagnosis Date   Acute diastolic CHF (congestive heart failure) (HCC) 10/25/2021   Anxiety    Aortic insufficiency    Atrial fibrillation (HCC)    Chronic diastolic CHF (congestive heart failure) (HCC)    Chronic kidney disease, stage 3a (HCC)    Diverticulitis    Dyslipidemia    Hypertension    Inferior mesenteric vein thrombosis (HCC)    Mitral regurgitation    Nonrheumatic mitral valve regurgitation 10/28/2021   Paroxysmal atrial fibrillation (HCC) 01/12/2018   Persistent atrial fibrillation (HCC)    Prostate cancer [facility])    Past Surgical History:  Procedure Laterality Date   CARDIOVERSION N/A 03/01/2018   Procedure: CARDIOVERSION;  Surgeon: Millicent Ally, MD;  Location: [facility] ENDOSCOPY;  Service: Cardiovascular;  Laterality: N/A;   CARDIOVERSION N/A 08/26/2021   Procedure: CARDIOVERSION;  Surgeon: Hazle Lites, MD;  Location: [facility] ENDOSCOPY;  Service: Cardiovascular;  Laterality: N/A;   CARDIOVERSION N/A 12/06/2021   Procedure: CARDIOVERSION;  Surgeon: Wendie Hamburg, MD;  Location: [facility] ENDOSCOPY;  Service: Cardiovascular;  Laterality: N/A;   CATARACT EXTRACTION  01/27/2010   right   CATARACT EXTRACTION  03/04/2010   left   Prostate seed implants      PROSTATE SURGERY     Social History   Tobacco Use   Smoking status: Never   Smokeless tobacco: Never  Vaping Use   Vaping status: Never Used  Substance Use Topics   Alcohol use: No    Comment: Little wine occasionally    Drug use: No   Social History   Socioeconomic History   Marital status: Widowed    Spouse name: Not on file   Number of children: Not on file   Years of education: Not on file   Highest education level: Not on file  Occupational History   Occupation: Professor---RETIRED    Employer: RETIRED    Comment: Retired  Tobacco Use   Smoking status: Never   Smokeless tobacco: Never  Vaping Use   Vaping status: Never Used  Substance and Sexual Activity   Alcohol use: No    Comment: Little wine occasionally    Drug use: No   Sexual activity: Never    Partners: Female  Other Topics Concern   Not on file  Social History Narrative   Not on file   Social Drivers of Health   Financial Resource Strain: Low Risk  (02/21/2024)   Overall Financial Resource Strain (CARDIA)    Difficulty of Paying Living Expenses: Not hard at all  Food Insecurity: No Food Insecurity (02/21/2024)   Hunger Vital Sign    Worried About Running  Out of Food in the Last Year: Never true    Ran Out of Food in the Last Year: Never true  Transportation Needs: No Transportation Needs (02/21/2024)   PRAPARE - Administrator, Civil Service (Medical): No    Lack of Transportation (Non-Medical): No  Physical Activity: Inactive (02/21/2024)   Exercise Vital Sign    Days of Exercise per Week: 0 days    Minutes of Exercise per Session: 0 min  Stress: No Stress Concern Present (02/21/2024)   [employer] of Occupational Health - Occupational Stress Questionnaire    Feeling of Stress : Only a little  Social Connections: Moderately Isolated (02/21/2024)   Social Connection and Isolation Panel [NHANES]    Frequency of Communication with Friends and Family: More than three times a  week    Frequency of Social Gatherings with Friends and Family: Three times a week    Attends Religious Services: More than 4 times per year    Active Member of Clubs or Organizations: No    Attends Banker Meetings: Never    Marital Status: Widowed  Intimate Partner Violence: Not At Risk (02/21/2024)   Humiliation, Afraid, Rape, and Kick questionnaire    Fear of Current or Ex-Partner: No    Emotionally Abused: No    Physically Abused: No    Sexually Abused: No   Family Status  Relation Name Status   Mother  Deceased   Father  Deceased   Brother  Deceased   MGM  Deceased   MGF  Deceased   PGM  Deceased   PGF  Deceased  No partnership data on file   Family History  Problem Relation Age of Onset   Dementia Mother    Alzheimer's disease Mother    Mental illness Mother        ALZ DISEASE   Cancer Brother 39       pancreatic cancer   Allergies  Allergen Reactions   Alpha-Gal Other (See Comments)    Avoidance of meat, dairy products, and other products that contain alpha-gal. Individuals diagnosed with AGS should completely avoid the consumption of mammalian meat, including beef, pork, lamb, venison, goat, and rabbit.    Sulfonamide Derivatives Other (See Comments)    Unknown - patient cannot remember the reaction    Review of Systems  Constitutional:  Negative for chills, fever and malaise/fatigue.  HENT:  Negative for congestion and hearing loss.   Eyes:  Negative for blurred vision and discharge.  Respiratory:  Negative for cough, sputum production and shortness of breath.   Cardiovascular:  Negative for chest pain, palpitations and leg swelling.  Gastrointestinal:  Negative for abdominal pain, blood in stool, constipation, diarrhea, heartburn, nausea and vomiting.  Genitourinary:  Negative for dysuria, frequency, hematuria and urgency.  Musculoskeletal:  Negative for back pain, falls and myalgias.  Skin:  Negative for rash.  Neurological:  Negative for  dizziness, sensory change, loss of consciousness, weakness and headaches.  Endo/Heme/Allergies:  Negative for environmental allergies. Does not bruise/bleed easily.  Psychiatric/Behavioral:  Negative for depression and suicidal ideas. The patient is not nervous/anxious and does not have insomnia.       Objective:     BP 128/88 (BP Location: Left Arm, Patient Position: Sitting, Cuff Size: Normal)   Pulse (!) 117   Temp 98.1 F (36.7 C) (Oral)   Resp 16   Ht 5\' 5"  (1.651 m)   Wt 156 lb 3.2 oz (70.9 kg)   SpO2 99%  BMI 25.99 kg/m  BP Readings from Last 3 Encounters:  04/07/24 128/88  02/21/24 120/70  02/15/24 130/76   Wt Readings from Last 3 Encounters:  04/07/24 156 lb 3.2 oz (70.9 kg)  02/21/24 155 lb 3.2 oz (70.4 kg)  02/15/24 154 lb (69.9 kg)   SpO2 Readings from Last 3 Encounters:  04/07/24 99%  02/15/24 97%  11/05/23 98%      Physical Exam Vitals and nursing note reviewed.  Constitutional:      General: He is not in acute distress.    Appearance: Normal appearance. He is well-developed.  HENT:     Head: Normocephalic and atraumatic.  Eyes:     General: No scleral icterus.       Right eye: No discharge.        Left eye: No discharge.  Cardiovascular:     Rate and Rhythm: Normal rate and regular rhythm.     Heart sounds: No murmur heard. Pulmonary:     Effort: Pulmonary effort is normal. No respiratory distress.     Breath sounds: Normal breath sounds.  Musculoskeletal:        General: Normal range of motion.     Cervical back: Normal range of motion and neck supple.     Right lower leg: No edema.     Left lower leg: No edema.  Skin:    General: Skin is warm and dry.  Neurological:     General: No focal deficit present.     Mental Status: He is alert and oriented to person, place, and time.  Psychiatric:        Mood and Affect: Mood normal.        Behavior: Behavior normal.        Thought Content: Thought content normal.        Judgment: Judgment  normal.      No results found for any visits on 04/07/24.  Last CBC Lab Results  Component Value Date   WBC 5.4 07/15/2023   HGB 13.8 07/15/2023   HCT 41.5 07/15/2023   MCV 93.9 07/15/2023   MCH 31.2 07/15/2023   RDW 16.2 (H) 07/15/2023   PLT 168 07/15/2023   Last metabolic panel Lab Results  Component Value Date   GLUCOSE 75 10/08/2023   NA 138 10/08/2023   K 5.0 10/08/2023   CL 103 10/08/2023   CO2 30 10/08/2023   BUN 32 (H) 10/08/2023   CREATININE 1.29 10/08/2023   GFR 47.88 (L) 10/08/2023   CALCIUM 9.1 10/08/2023   PHOS 4.1 08/23/2022   PROT 6.4 10/08/2023   ALBUMIN 3.7 10/08/2023   BILITOT 0.8 10/08/2023   ALKPHOS 59 10/08/2023   AST 23 10/08/2023   ALT 16 10/08/2023   ANIONGAP 11 07/15/2023   Last lipids Lab Results  Component Value Date   CHOL 105 07/12/2023   HDL 48.60 07/12/2023   LDLCALC 43 07/12/2023   TRIG 67.0 07/12/2023   CHOLHDL 2 07/12/2023   Last hemoglobin A1c Lab Results  Component Value Date   HGBA1C 5.0 05/21/2012   Last thyroid functions Lab Results  Component Value Date   TSH 1.498 05/25/2023   Last vitamin D No results found for: "25OHVITD2", "25OHVITD3", "VD25OH" Last vitamin B12 and Folate Lab Results  Component Value Date   VITAMINB12 484 06/06/2023      The ASCVD Risk score (Arnett DK, et al., 2019) failed to calculate for the following reasons:   The 2019 ASCVD risk score is only valid for  ages 36 to 68    Assessment & Plan:   Problem List Items Addressed This Visit       Unprioritized   Peripheral neuropathy   Relevant Medications   gabapentin (NEURONTIN) 100 MG capsule   Paroxysmal atrial fibrillation (HCC)   Relevant Medications   simvastatin (ZOCOR) 10 MG tablet   apixaban (ELIQUIS) 5 MG TABS tablet   Hyperlipidemia   Encourage heart healthy diet such as MIND or DASH diet, increase exercise, avoid trans fats, simple carbohydrates and processed foods, consider a krill or fish or flaxseed oil cap  daily.        Relevant Medications   simvastatin (ZOCOR) 10 MG tablet   apixaban (ELIQUIS) 5 MG TABS tablet   Other Relevant Orders   Comprehensive metabolic panel with GFR   Lipid panel   Essential hypertension   Well controlled, no changes to meds. Encouraged heart healthy diet such as the DASH diet and exercise as tolerated.        Relevant Medications   simvastatin (ZOCOR) 10 MG tablet   apixaban (ELIQUIS) 5 MG TABS tablet   Other Visit Diagnoses       Chronic pain of both shoulders    -  Primary   Relevant Medications   gabapentin (NEURONTIN) 100 MG capsule   Other Relevant Orders   Ambulatory referral to Orthopedic Surgery     No history of vaccination for measles, mumps, rubella (MMR)       Relevant Orders   Measles/Mumps/Rubella Immunity     Assessment and Plan Assessment & Plan Burning Hand Syndrome   He has been taking gabapentin for neuropathic pain in his hands and is considering reducing the dose to assess its necessity. Reduce gabapentin to one tablet and monitor for symptom return. If symptoms do not return, consider discontinuing gabapentin.  Shoulder Pain   He reports bilateral shoulder pain following falls, with the right shoulder more affected. The pain is intermittent and affects certain activities and positions. A suspected tendon injury requires an orthopedic evaluation. Refer to an orthopedic specialist at [facility] for assessment.  Hypotension   He experiences low systolic blood pressure, contributing to low energy levels. He is currently on spironolactone and torsemide, with metoprolol discontinued. A cardiologist consultation is necessary before any medication changes.  Kidney Function Monitoring   There is concern about kidney function due to previous tests indicating increased workload. Order blood work to recheck kidney function and send a detailed report of the results to him by mail.  Measles Immunity   His measles vaccination status is  uncertain. Check measles titers in blood work to assess immunity and determine the need for vaccination.    Return in about 6 months (around 10/07/2024).    Mark Squillace R Lowne Chase, DO

## 2024-04-07 NOTE — Assessment & Plan Note (Signed)
 Well controlled, no changes to meds. Encouraged heart healthy diet such as the DASH diet and exercise as tolerated.

## 2024-04-07 NOTE — Assessment & Plan Note (Signed)
 Encourage heart healthy diet such as MIND or DASH diet, increase exercise, avoid trans fats, simple carbohydrates and processed foods, consider a krill or fish or flaxseed oil cap daily.

## 2024-04-07 NOTE — Patient Instructions (Signed)
Shoulder Pain Many things can cause shoulder pain, including: An injury to the shoulder. Overuse of the shoulder. Arthritis. The source of the pain can be: Inflammation. An injury to the shoulder joint. An injury to a tendon, ligament, or bone. Follow these instructions at home: Pay attention to changes in your symptoms. Let your health care provider know about them. Follow these instructions to relieve your pain. If you have a removable sling: Wear the sling as told by your provider. Remove it only as told by your provider. Check the skin around the sling every day. Tell your provider about any concerns. Loosen the sling if your fingers tingle, become numb, or become cold. Keep the sling clean. If the sling is not waterproof: Do not let it get wet. Remove it to shower or bathe. Move your arm as little as possible, but keep your hand moving to prevent swelling. Managing pain, stiffness, and swelling  If told, put ice on the painful area. If you have a removable sling or immobilizer, remove it as told by your provider. Put ice in a plastic bag. Place a towel between your skin and the bag. Leave the ice on for 20 minutes, 2-3 times a day. If your skin turns bright red, remove the ice right away to prevent skin damage. The risk of damage is higher if you cannot feel pain, heat, or cold. Move your fingers often to reduce stiffness and swelling. Squeeze a soft ball or a foam pad as much as possible. This helps to keep the shoulder from swelling. It also helps to strengthen the arm. General instructions Take over-the-counter and prescription medicines only as told by your provider. Exercise may help with pain management. Perform exercises if told by your provider. You may be referred to a physical therapist to help in your recovery process. Keep all follow-up visits in order to avoid any type of permanent shoulder disability or chronic pain problems. Contact a health care provider  if: Your pain is not relieved with medicines. New pain develops in your arm, hand, or fingers. You loosen your sling and your arm, hand, or fingers remain tingly, numb, swollen, or painful. Get help right away if: Your arm, hand, or fingers turn white or blue. This information is not intended to replace advice given to you by your health care provider. Make sure you discuss any questions you have with your health care provider. Document Revised: 07/14/2022 Document Reviewed: 07/14/2022 Elsevier Patient Education  2024 Elsevier Inc.  

## 2024-04-08 LAB — MEASLES/MUMPS/RUBELLA IMMUNITY
Mumps IgG: 106 [AU]/ml
Rubella: 29.4 {index}
Rubeola IgG: >300 [AU]/ml

## 2024-04-09 ENCOUNTER — Other Ambulatory Visit: Payer: Self-pay | Admitting: Family Medicine

## 2024-04-09 DIAGNOSIS — I5032 Chronic diastolic (congestive) heart failure: Secondary | ICD-10-CM

## 2024-04-22 ENCOUNTER — Other Ambulatory Visit: Payer: Self-pay | Admitting: Orthopedic Surgery

## 2024-04-22 DIAGNOSIS — I5032 Chronic diastolic (congestive) heart failure: Secondary | ICD-10-CM

## 2024-04-23 NOTE — Telephone Encounter (Signed)
 High risk warning and pcp not Immunologist care provider.

## 2024-04-29 ENCOUNTER — Other Ambulatory Visit: Payer: Self-pay | Admitting: Family

## 2024-04-29 DIAGNOSIS — F419 Anxiety disorder, unspecified: Secondary | ICD-10-CM

## 2024-05-05 DIAGNOSIS — M25512 Pain in left shoulder: Secondary | ICD-10-CM | POA: Diagnosis not present

## 2024-05-05 DIAGNOSIS — M25511 Pain in right shoulder: Secondary | ICD-10-CM | POA: Diagnosis not present

## 2024-05-05 NOTE — Telephone Encounter (Unsigned)
 Copied from CRM [id number]. Topic: Clinical - Medication Refill >> May 05, 2024  9:26 AM Emylou G wrote: Medication: clorazepate  (TRANXENE ) 7.5 MG tablet  Would like long refills.. or automatic refills  Has the patient contacted their pharmacy? Yes (Agent: If no, request that the patient contact the pharmacy for the refill. If patient does not wish to contact the pharmacy document the reason why and proceed with request.) (Agent: If yes, when and what did the pharmacy advise?) they are asking for wrong one?  This is the patient's preferred pharmacy:  [id number] [place], [place] - [address] [place] [address] Phone: [phone] Fax: [phone]  Is this the correct pharmacy for this prescription? Yes If no, delete pharmacy and type the correct one.   Has the prescription been filled recently? No  Is the patient out of the medication? Yes  Has the patient been seen for an appointment in the last year OR does the patient have an upcoming appointment? Yes  Can we respond through MyChart? No  Agent: Please be advised that Rx refills may take up to 3 business days. We ask that you follow-up with your pharmacy.

## 2024-05-09 ENCOUNTER — Telehealth: Payer: Self-pay | Admitting: Neurology

## 2024-05-09 NOTE — Telephone Encounter (Signed)
 Copied from CRM [id number]. Topic: Clinical - Medical Advice >> May 09, 2024  1:58 PM Aisha D wrote: Reason for CRM: Pt stated that he no longer needs to take the clorazepate  (TRANXENE ) 7.5 MG tablet. Pt stated that he doesn't have the same level of anxiety he had when the medication was first prescribed. Pt stated that he wants to take 3 quarters for a week, 1 half for a week, 1 quarter for a week and then stop the medication. Pt would like for Dr.Lowne or the nurse to give him a call back regarding this.

## 2024-05-12 NOTE — Telephone Encounter (Signed)
 Spoke with patient. Pt states he will titrate down the med as described below and will call if he needs further assistance.

## 2024-05-14 NOTE — Progress Notes (Signed)
 Ophthalmology Department Clinical Visit Note     CHIEF COMPLAINT Patient presents for Glaucoma   HISTORY OF PRESENT ILLNESS: Mark Edward Naugle Jr. is a 89 y.o. old male who presents to the clinic today for:   HPI     Glaucoma   The patient is here for a return visit.  The patient is currently taking glaucoma medications (Latanoprost  x1 OU).  The patient always complies with treatment.  The patient reports they are tolerating drops well.  The patient does not need an eye medication refill.      Last edited by Marny CHRISTELLA [name], COA on 05/15/2024 12:41 PM.     First seen by bb 06/26/2022 Referred by Dr. Greven for high IOP OS @ [facility] on 06/08/2022: IOP 20, 34; OS orbital bruising; nerve 0.6 OU; started brim x2 OS exARMD OU - sees [facility] for aVEGF OU [place] OU - Dr. Leslee Neat on eliquis   PhD in Sociology [facility]  CURRENT MEDICATIONS: Current Outpatient Medications on File Prior to Visit (Ophthalmic Drugs)  Medication Sig  . [DISCONTINUED] latanoprost  (XALATAN ) 0.005 % ophthalmic solution Administer 1 drop into each eyes at bedtime.   Current Outpatient Medications on File Prior to Visit (Other)  Medication Sig  . vit C,E-Zn-coppr-lutein-zeaxan (PreserVision AREDS-2) 250-90-40-1 mg cap capsule Take 1 tablet by mouth 2 (two) times a day.  . amiodarone  (PACERONE ) 200 mg tablet Take 200 mg by mouth.  . apixaban  (Eliquis ) 5 mg tab Take 5 mg by mouth 2 (two) times a day.  . b complex vitamins cap capsule Take 1 capsule by mouth Once Daily.  . calcium  carbonate (OS-CAL) 1500 mg (600 mg calcium ) tablet Take 600 mg by mouth 2 (two) times a day with meals.  . clorazepate  (TRANXENE ) 15 mg tablet Take 15 mg by mouth 2 (two) times a day.  . cromolyn  (NASALCROM ) 5.2 mg/spray (4 %) spry nasal spray Administer 1 spray into affected nostril(s) 4 (four) times a day.  . doxazosin  (CARDURA ) 4 mg tablet Take 4 mg by mouth nightly.  . fenofibrate  (LOFIBRA) 160 mg tablet Take 160 mg by  mouth Once Daily.  . metoprolol  succinate (TOPROL  XL) 100 mg 24 hr tablet Take 100 mg by mouth Once Daily.  SABRA omega 3-dha-epa-fish oil (OMEGA 3) 1,000 mg capsule Take 1 g by mouth Once Daily.  . potassium chloride  (KLOR-CON ) 10 mEq ER tablet Take  by mouth.  . simvastatin  (ZOCOR ) 10 mg tablet Take 10 mg by mouth.  . tamsulosin  (FLOMAX ) 0.4 mg cap TAKE 1 CAPSULE BY MOUTH EVERYDAY AT BEDTIME  . torsemide  (DEMADEX ) 20 mg tablet Take 20 mg by mouth Once Daily.    Referring physician: No referring provider defined for this encounter.  ALLERGIES Allergies  Allergen Reactions  . Sulfamethoxazole Other (See Comments)    Pt. Doesn't remember - adverse reaction was so long ago    PAST MEDICAL HISTORY Past Medical History:  Diagnosis Date  . Arthritis   . Atrial fibrillation    (CMD)   . Cataract   . CHF (congestive heart failure)    (CMD)   . Macular degeneration   . Prostate cancer    (CMD) 2008   Med Hx: Prostate cancer (HCC); PSA normal now   Past Surgical History:  Procedure Laterality Date  . AVASTIN  INJECTION Bilateral    Procedure: AVASTIN  INJECTION  . CARDIOVERSION  03/01/2018   Procedure: CARDIOVERSION  . CATARACT EXTRACTION     Procedure: CATARACT EXTRACTION; Dr. McCuen  O.U. 2011    FAMILY HISTORY Family History  Problem Relation Name Age of Onset  . Hypertension Father    . Stroke Father    . Cancer Maternal Uncle    . Blindness Paternal Uncle    . Amblyopia Neg Hx    . Cataracts Neg Hx    . Diabetes Neg Hx    . Glaucoma Neg Hx    . Macular degeneration Neg Hx    . Retinal detachment Neg Hx    . Strabismus Neg Hx    . Thyroid  disease Neg Hx      SOCIAL HISTORY Social History   Tobacco Use  . Smoking status: Former    Types: Cigarettes    Start date: 11/07/1972  . Smokeless tobacco: Never  Substance Use Topics  . Alcohol use: Yes  . Drug use: No        OPHTHALMIC EXAM:  Base Eye Exam     Visual Acuity (Snellen - Linear)       Right Left    Dist cc 20/800 20/60   Dist ph cc NI NI    Correction: Glasses         Tonometry (Applanation, 1:07 PM)       Right Left   Pressure 18 16         Pupils       Pupils APD   Right PERRL None   Left PERRL None         Neuro/Psych     Oriented x3: Yes   Mood/Affect: Normal           Slit Lamp and Fundus Exam     Slit Lamp Exam       Right Left   Lids/Lashes Mild ptosis Mild ptosis   Conjunctiva/Sclera White and quiet White and quiet   Cornea Clear Clear   Anterior Chamber Deep and quiet Deep and quiet   Iris Round and reactive Round and reactive   Lens PC IOL PC IOL            IMAGING AND PROCEDURES:  @EYRES @     ASSESSMENT:  1. Primary open angle glaucoma (POAG) of left eye, moderate stage       Patient is tolerating and taking medication as prescribed. IOP good OU. Continue drops the same  Recheck in 5 months  PLAN:  Latanoprost  x1 OU Reviewed proper drop installation technique  Return in about 5 months (around 10/15/2024).  Ophthalmic Meds Ordered this visit:  New Medications Ordered This Visit  Medications  . latanoprost  (XALATAN ) 0.005 % ophthalmic solution    Sig: Administer 1 drop into both eyes at bedtime.    Dispense:  2.5 mL    Refill:  11      Patient Instructions   Reminder: Please refrigerate extra bottles until you begin use.  Drug EYE R = Right L = Left B = Both Breakfast Lunch Supper Bedtime Time Frame    Latanoprost  (Teal green top)  B    X   HOW TO TAKE EYEDROPS 1. Make sure you understand when, and exactly how often, to take your eyedrop medicine. Preferably, learn the names of your medicines, or at least be able to identify your eyedrops by the color of the cap of the bottle (e.g., "I take the purple top 2 times a day in the right eye and the yellow top 1 time a day in both eyes."). 2. Always wash your hands prior to instilling your eyedrops.  3. If you are putting drops in both eyes, then sit down  or lie down to put in your drops. This is because it is important to close the eye getting drops for 2 minutes immediately after instilling the drop (this markedly improves absorption). If you are putting the drops in both eyes, then you will be closing both eyes for at least 2 minutes, therefore you should be sitting or lying down. If you must stand to put in the drops (need to look in mirror), then have a chair next to you so that you can sit just after instilling the drops and close your eyes for 2 minutes. If you are only instilling an eyedrop in one eye, then you only have to close that eye after the drop, and therefore you don't necessarily have to sit or lie. 4. If you are taking more than one drop at a time in the same eye, then you must separate the drops by at least 10 minutes. 5. To put in the drop, hold the bottle in your dominant hand between your forefinger and thumb and invert the bottle over the targeted eye. Your head should be tilted back and you should roll your eyes upward. 6. With your other hand, pull the lower eyelid down and try to instill the drop in the "pouch" made by pulling the lid down, or on the lower part of the eye ball. Position the bottle close to the eye, but do not touch the eye with the dropper tip. Squeeze the sides of the bottle and a drop of the medicine will come out. 7. You will feel the drop when it contacts the eye, and then you should immediately close the eye, or if you are putting the drop in both eyes, move your hands immediately to the other eye, and place the drop in it, and then close both eyes as recommended in #2 above. 8. While the eye/s are closed, use a tissue to wipe away any medication that remains on the lid or lashes. You may do this during the 2 minutes that the eye remains closed, just do not open the eye to wipe it. 9. Always remember to put the cap back on the bottle and store the bottle away from pets and not in direct sunlight. The bottle does  not have to be refrigerated, but it may be if that is preferred (some people prefer feeling the cooled eyedrop on the eye). 10. Never stop the drops without conferring with your doctor first, even if you feel that the drops are not helping your eye. If you believe you are experiencing side-effects from the eyedrops, then discuss that, or any issues or concerns, with your doctor.    Explained the diagnoses, plan, and follow up with the patient and they expressed understanding.  Patient expressed understanding of the importance of proper follow up care.   This document serves as a record of services personally performed by J. Thresa Bracket. It was created on their behalf by My Rayleen, a trained medical scribe. The creation of this record is the provider's dictation and/or activities during the visit. I agree the documentation is accurate and complete.  Electronically signed by: JINNY Thresa Bracket, MD 05/18/2024 4:33 PM

## 2024-05-15 DIAGNOSIS — H401122 Primary open-angle glaucoma, left eye, moderate stage: Secondary | ICD-10-CM | POA: Diagnosis not present

## 2024-05-26 ENCOUNTER — Encounter (HOSPITAL_BASED_OUTPATIENT_CLINIC_OR_DEPARTMENT_OTHER): Payer: Self-pay | Admitting: Nurse Practitioner

## 2024-05-26 ENCOUNTER — Ambulatory Visit (INDEPENDENT_AMBULATORY_CARE_PROVIDER_SITE_OTHER): Payer: Medicare Other | Admitting: Nurse Practitioner

## 2024-05-26 VITALS — BP 126/70 | HR 117 | Ht 66.0 in | Wt 150.0 lb

## 2024-05-26 DIAGNOSIS — R634 Abnormal weight loss: Secondary | ICD-10-CM

## 2024-05-26 DIAGNOSIS — E782 Mixed hyperlipidemia: Secondary | ICD-10-CM

## 2024-05-26 DIAGNOSIS — F419 Anxiety disorder, unspecified: Secondary | ICD-10-CM

## 2024-05-26 DIAGNOSIS — I10 Essential (primary) hypertension: Secondary | ICD-10-CM | POA: Diagnosis not present

## 2024-05-26 DIAGNOSIS — Z7901 Long term (current) use of anticoagulants: Secondary | ICD-10-CM | POA: Diagnosis not present

## 2024-05-26 DIAGNOSIS — I5032 Chronic diastolic (congestive) heart failure: Secondary | ICD-10-CM | POA: Diagnosis not present

## 2024-05-26 DIAGNOSIS — I4821 Permanent atrial fibrillation: Secondary | ICD-10-CM | POA: Diagnosis not present

## 2024-05-26 DIAGNOSIS — I340 Nonrheumatic mitral (valve) insufficiency: Secondary | ICD-10-CM

## 2024-05-26 NOTE — Progress Notes (Signed)
 Cardiology Office Note   Date:  05/26/2024  ID:  Mark Stark., DOB 01-Jan-1928, MRN [id number] PCP: Estill Hemming, DO  [facility] Providers Cardiologist:  Sheryle Donning, MD     [facility] Hypertension Dyslipidemia Aortic insufficiency Moderate to severe on TTE 05/26/2023 Consideration given to mitral clip >> Med mgmt continued Atrial fibrillation on chronic anticoagulation Mitral regurgitation Inferior mesenteric vein thrombosis Prostate cancer Anxiety  Admission 10/25/2021 with worsening shortness of breath, LE edema, and orthopnea. CXR revealed small bilateral effusions and mild interstitial edema.   Readmission 07/2022 with progressive weakness, hypertension.  MRI revealed no evidence of CVA.  Admission 04/2023 for hyponatremia and RLE cellulitis in the setting of heart failure exacerbation.  Treated with IV diuretic recess and antibiotic therapy.  Seen 07/14/2023 in ED with hypotension 80 over 50s at home.  Suffered mechanical fall while ambulating out of the ED with 2 skin tears to the elbow.  Hypertension felt to be secondary to new gabapentin  Rx.   Last cardiology clinic visit 02/15/2024 with Dr. Veryl Gottron.  Feeling fatigued/exhausted in the evenings.  Home BP 102-125/68/84, HR of 60 to 70 bpm.  Weight stable.  No lightheadedness, presyncope, or syncope.  Dr. Veryl Gottron noted that murmur is much more quiet when patient is euvolemic.  He was continued on daily torsemide  with as needed instructions for second dose.  No room for ARB/ARNI, continuing Jardiance  and spironolactone .  He continued to wish to avoid MitraClip if possible. 3 month f/u recommended.   History of Present Illness History of Present Illness Mark Stark. is a very pleasant 89 year old male who is here today for follow-up of atrial fibrillation and leg edema. He reports significant diuresis from torsemide , leading to frequent urination and occasional sleep disruption.  Torsemide  has currently  improved his edema, so he does not want to discontinue the medication.  We discussed skipping a dose on days that he cannot be at home. He carefully monitors daily weight, BP, pulse, and O2 sat. Weight is stable. Home BP ranges 102-124/69-83 mmHg. He reports decreased appetite due to diminished taste and smell. He has lost approximately 7 lbs in the past 2 months. He eats regular meals but admits he probably needs to supplement with protein shakes. Recent mild increase in PSA which is concerning to him 2/2 prior prostate cancer. He experiences low systolic blood pressure associated with fatigue, especially after physical activity, and episodes of cold sweats post-exertion. He maintains adequate hydration. He has history of atrial fibrillation with three unsuccessful cardioversions. He is asymptomatic, even at times when HR is elevated. No bleeding issues. He denies chest pain, shortness of breath, orthopnea, PND, presyncope, syncope.  ROS: See HPI  Studies Reviewed EKG Interpretation Date/Time:  Monday May 26 2024 11:28:30 EDT Ventricular Rate:  117 PR Interval:    QRS Duration:  74 QT Interval:  266 QTC Calculation: 371 R Axis:   -3  Text Interpretation: Atrial fibrillation with rapid ventricular response When compared with ECG of 15-Jul-2023 18:24, PREVIOUS ECG IS PRESENT Confirmed by Slater Duncan [phone]) on 05/26/2024 11:40:59 AM     No results found for: "LIPOA"  Risk Assessment/Calculations  CHA2DS2-VASc Score = 5   This indicates a 7.2% annual risk of stroke. The patient's score is based upon: CHF History: 1 HTN History: 1 Diabetes History: 0 Stroke History: 0 Vascular Disease History: 1 Age Score: 2 Gender Score: 0            Physical Exam  VS:  BP 126/70 (BP Location: Left Arm, Patient Position: Sitting, Cuff Size: Normal)   Pulse (!) 117   Ht 5\' 6"  (1.676 m)   Wt 150 lb (68 kg)   SpO2 96%   BMI 24.21 kg/m    Wt Readings from Last 3 Encounters:  05/26/24 150 lb  (68 kg)  04/07/24 156 lb 3.2 oz (70.9 kg)  02/21/24 155 lb 3.2 oz (70.4 kg)    GEN: Well nourished, well developed in no acute distress NECK: No JVD; No carotid bruits CARDIAC: Irregular RR, no murmurs, rubs, gallops RESPIRATORY:  Clear to auscultation without rales, wheezing or rhonchi  ABDOMEN: Soft, non-tender, non-distended EXTREMITIES:  No edema; No deformity   ASSESSMENT AND PLAN Assessment & Plan Permanent Atrial Fibrillation on chronic anticoagulation HR initially elevated at 117 bpm, but improved to 100 bpm with rest. He is asymptomatic with A-fib but asks about ablation due to his son's successful ablation. Previous cardioversions were unsuccessful. We discussed risks of invasive procedure and he would like to continue with conservative management. No bleeding concerns. Creatinine on 04/07/2024 was 1.65, and increased from previous. We will recheck CBC and BMET today.  If creatinine remains > 1.5, we will need to reduce Eliquis  to 2.5 mg twice daily.  Continue Eliquis  for stroke prevention for CHA2DS2-VASc score of 5.  Valve Disease Echo 05/26/2023 with severe mitral valve regurgitation, moderate AI, hyperdynamic LVEF, bilateral atrial enlargement, mildly elevated PASP. No significant change from echo 07/2022. He denies chest pain, DOE, edema, orthopnea, palpitations, presyncope, syncope.  We will continue to monitor clinically for now.  Chronic HFpEF NYHA Class II symptoms.  Echo with hyperdynamic EF, moderate to severe AI.  Weight is stable and he appears euvolemic on exam. No DOE, orthopnea, PND. Reports leg edema is well controlled on torsemide . He has not required additional torsemide  for weight gain/edema. We discussed holding a dose of torsemide  in the setting of being out of his home for long periods of the day. BP is soft at times, but overall well controlled.  Will continue low-dose spironolactone , torsemide , and Jardiance . Will repeat BMET today.   Hypertension   BP is well  controlled. He is concerned that intermittent low systolic BP is contributing to fatigue. No presyncope or syncope. He will continue to liberalize salt intake to manage soft blood pressure.  No change in antihypertensive therapy today.  Hyperlipidemia  Lipid panel completed 04/07/2024 with total cholesterol 111, triglycerides 75, LDL 49, HDL 47.20.  Lipids are well-controlled.  Continue simvastatin .  Weight Loss   His weight decreased from 148 lbs to 141 lbs, with a decline in appetite contributing to the loss. He is concerned about weight loss in the context of rising PSA levels. Encouraged the use of protein shakes to supplement nutrition.   Anxiety   He has long-term use of clorazepate  for anxiety related to past professional activities and is currently tapering off the medication without withdrawal symptoms. Continue tapering off clorazepate  as planned.         Disposition: 6 months with Dr. Veryl Gottron or APP  Signed, Slater Duncan, NP-C

## 2024-05-26 NOTE — Patient Instructions (Signed)
 Medication Instructions:   Your physician recommends that you continue on your current medications as directed. Please refer to the Current Medication list given to you today.   *If you need a refill on your cardiac medications before your next appointment, please call your pharmacy*  Lab Work:  TODAY!!!! CBC/BMET  If you have labs (blood work) drawn today and your tests are completely normal, you will receive your results only by: MyChart Message (if you have MyChart) OR A paper copy in the mail If you have any lab test that is abnormal or we need to change your treatment, we will call you to review the results.  Testing/Procedures:  None ordered.  Follow-Up: At [facility], you and your health needs are our priority.  As part of our continuing mission to provide you with exceptional heart care, our providers are all part of one team.  This team includes your primary Cardiologist (physician) and Advanced Practice Providers or APPs (Physician Assistants and Nurse Practitioners) who all work together to provide you with the care you need, when you need it.  Your next appointment:   6 month(s)  Provider:   Sheryle Donning, MD, Slater Duncan, NP, or Neomi Banks, NP    We recommend signing up for the patient portal called "MyChart".  Sign up information is provided on this After Visit Summary.  MyChart is used to connect with patients for Virtual Visits (Telemedicine).  Patients are able to view lab/test results, encounter notes, upcoming appointments, etc.  Non-urgent messages can be sent to your provider as well.   To learn more about what you can do with MyChart, go to [email or web address].   Other Instructions  Your physician wants you to follow-up in: 6 months.  You will receive a reminder letter in the mail two months in advance. If you don't receive a letter, please call our office to schedule the follow-up appointment.

## 2024-05-27 ENCOUNTER — Ambulatory Visit (HOSPITAL_BASED_OUTPATIENT_CLINIC_OR_DEPARTMENT_OTHER): Payer: Self-pay | Admitting: Nurse Practitioner

## 2024-05-27 LAB — BASIC METABOLIC PANEL WITH GFR
BUN/Creatinine Ratio: 22 (ref 10–24)
BUN: 32 mg/dL (ref 10–36)
CO2: 25 mmol/L (ref 20–29)
Calcium: 9.7 mg/dL (ref 8.6–10.2)
Chloride: 97 mmol/L (ref 96–106)
Creatinine, Ser: 1.47 mg/dL — ABNORMAL HIGH (ref 0.76–1.27)
Glucose: 78 mg/dL (ref 70–99)
Potassium: 4.9 mmol/L (ref 3.5–5.2)
Sodium: 137 mmol/L (ref 134–144)
eGFR: 44 mL/min/{1.73_m2} — ABNORMAL LOW (ref >59)

## 2024-05-27 LAB — CBC
Hematocrit: 53.1 % — ABNORMAL HIGH (ref 37.5–51.0)
Hemoglobin: 17.6 g/dL (ref 13.0–17.7)
MCH: 31.6 pg (ref 26.6–33.0)
MCHC: 33.1 g/dL (ref 31.5–35.7)
MCV: 95 fL (ref 79–97)
Platelets: 182 10*3/uL (ref 150–450)
RBC: 5.57 x10E6/uL (ref 4.14–5.80)
RDW: 13.2 % (ref 11.6–15.4)
WBC: 7.6 10*3/uL (ref 3.4–10.8)

## 2024-06-05 DIAGNOSIS — H35373 Puckering of macula, bilateral: Secondary | ICD-10-CM | POA: Diagnosis not present

## 2024-06-05 DIAGNOSIS — H353231 Exudative age-related macular degeneration, bilateral, with active choroidal neovascularization: Secondary | ICD-10-CM | POA: Diagnosis not present

## 2024-06-12 DIAGNOSIS — L570 Actinic keratosis: Secondary | ICD-10-CM | POA: Diagnosis not present

## 2024-06-12 DIAGNOSIS — D225 Melanocytic nevi of trunk: Secondary | ICD-10-CM | POA: Diagnosis not present

## 2024-06-12 DIAGNOSIS — L821 Other seborrheic keratosis: Secondary | ICD-10-CM | POA: Diagnosis not present

## 2024-06-12 DIAGNOSIS — L814 Other melanin hyperpigmentation: Secondary | ICD-10-CM | POA: Diagnosis not present

## 2024-06-15 ENCOUNTER — Other Ambulatory Visit: Payer: Self-pay | Admitting: Family Medicine

## 2024-06-15 DIAGNOSIS — J309 Allergic rhinitis, unspecified: Secondary | ICD-10-CM

## 2024-07-07 ENCOUNTER — Ambulatory Visit: Payer: Self-pay | Admitting: *Deleted

## 2024-07-07 ENCOUNTER — Emergency Department (HOSPITAL_BASED_OUTPATIENT_CLINIC_OR_DEPARTMENT_OTHER)
Admission: EM | Admit: 2024-07-07 | Discharge: 2024-07-07 | Disposition: A | Discharge status: Home / Self Care | Attending: Emergency Medicine | Admitting: Emergency Medicine

## 2024-07-07 ENCOUNTER — Emergency Department (HOSPITAL_BASED_OUTPATIENT_CLINIC_OR_DEPARTMENT_OTHER)

## 2024-07-07 ENCOUNTER — Other Ambulatory Visit (HOSPITAL_BASED_OUTPATIENT_CLINIC_OR_DEPARTMENT_OTHER): Payer: Self-pay

## 2024-07-07 ENCOUNTER — Other Ambulatory Visit: Payer: Self-pay

## 2024-07-07 DIAGNOSIS — G4700 Insomnia, unspecified: Secondary | ICD-10-CM | POA: Insufficient documentation

## 2024-07-07 DIAGNOSIS — H538 Other visual disturbances: Secondary | ICD-10-CM | POA: Diagnosis not present

## 2024-07-07 DIAGNOSIS — I4891 Unspecified atrial fibrillation: Secondary | ICD-10-CM | POA: Diagnosis not present

## 2024-07-07 DIAGNOSIS — I6523 Occlusion and stenosis of bilateral carotid arteries: Secondary | ICD-10-CM | POA: Diagnosis not present

## 2024-07-07 DIAGNOSIS — E860 Dehydration: Secondary | ICD-10-CM | POA: Insufficient documentation

## 2024-07-07 DIAGNOSIS — Z7901 Long term (current) use of anticoagulants: Secondary | ICD-10-CM | POA: Insufficient documentation

## 2024-07-07 DIAGNOSIS — R29818 Other symptoms and signs involving the nervous system: Secondary | ICD-10-CM | POA: Diagnosis not present

## 2024-07-07 DIAGNOSIS — R531 Weakness: Secondary | ICD-10-CM | POA: Diagnosis not present

## 2024-07-07 LAB — CBC WITH DIFFERENTIAL/PLATELET
Abs Immature Granulocytes: 0.09 K/uL — ABNORMAL HIGH (ref 0.00–0.07)
Basophils Absolute: 0.1 K/uL (ref 0.0–0.1)
Basophils Relative: 1 %
Eosinophils Absolute: 0.1 K/uL (ref 0.0–0.5)
Eosinophils Relative: 2 %
HCT: 45.8 % (ref 39.0–52.0)
Hemoglobin: 15.4 g/dL (ref 13.0–17.0)
Immature Granulocytes: 1 %
Lymphocytes Relative: 26 %
Lymphs Abs: 2 K/uL (ref 0.7–4.0)
MCH: 31.7 pg (ref 26.0–34.0)
MCHC: 33.6 g/dL (ref 30.0–36.0)
MCV: 94.2 fL (ref 80.0–100.0)
Monocytes Absolute: 1.2 K/uL — ABNORMAL HIGH (ref 0.1–1.0)
Monocytes Relative: 15 %
Neutro Abs: 4.3 K/uL (ref 1.7–7.7)
Neutrophils Relative %: 55 %
Platelets: 162 K/uL (ref 150–400)
RBC: 4.86 MIL/uL (ref 4.22–5.81)
RDW: 15.4 % (ref 11.5–15.5)
WBC: 7.8 K/uL (ref 4.0–10.5)
nRBC: 0 % (ref 0.0–0.2)

## 2024-07-07 LAB — COMPREHENSIVE METABOLIC PANEL WITH GFR
ALT: 24 U/L (ref 0–44)
AST: 42 U/L — ABNORMAL HIGH (ref 15–41)
Albumin: 4.1 g/dL (ref 3.5–5.0)
Alkaline Phosphatase: 52 U/L (ref 38–126)
Anion gap: 12 (ref 5–15)
BUN: 33 mg/dL — ABNORMAL HIGH (ref 8–23)
CO2: 24 mmol/L (ref 22–32)
Calcium: 9 mg/dL (ref 8.9–10.3)
Chloride: 98 mmol/L (ref 98–111)
Creatinine, Ser: 1.42 mg/dL — ABNORMAL HIGH (ref 0.61–1.24)
GFR, Estimated: 46 mL/min — ABNORMAL LOW (ref >60)
Glucose, Bld: 96 mg/dL (ref 70–99)
Potassium: 4.5 mmol/L (ref 3.5–5.1)
Sodium: 134 mmol/L — ABNORMAL LOW (ref 135–145)
Total Bilirubin: 1.6 mg/dL — ABNORMAL HIGH (ref 0.0–1.2)
Total Protein: 6.2 g/dL — ABNORMAL LOW (ref 6.5–8.1)

## 2024-07-07 LAB — URINALYSIS, ROUTINE W REFLEX MICROSCOPIC
Bilirubin Urine: NEGATIVE
Glucose, UA: 100 mg/dL — AB
Hgb urine dipstick: NEGATIVE
Ketones, ur: NEGATIVE mg/dL
Leukocytes,Ua: NEGATIVE
Nitrite: NEGATIVE
Protein, ur: NEGATIVE mg/dL
Specific Gravity, Urine: 1.01 (ref 1.005–1.030)
pH: 6 (ref 5.0–8.0)

## 2024-07-07 LAB — PROTIME-INR
INR: 1.2 (ref 0.8–1.2)
Prothrombin Time: 15.7 s — ABNORMAL HIGH (ref 11.4–15.2)

## 2024-07-07 MED ORDER — CLONAZEPAM 0.5 MG PO TABS
0.5000 mg | ORAL_TABLET | Freq: Every day | ORAL | 0 refills | Status: DC
  Filled 2024-07-07: qty 30, 30d supply, fill #0

## 2024-07-07 MED ORDER — SODIUM CHLORIDE 0.9 % IV BOLUS
500.0000 mL | Freq: Once | INTRAVENOUS | Status: AC
  Administered 2024-07-07: 500 mL via INTRAVENOUS

## 2024-07-07 NOTE — ED Notes (Signed)
 Discharge instructions reviewed with patient. Patient verbalizes understanding, no further questions at this time. Medications/prescriptions and follow up information provided. No acute distress noted at time of departure.

## 2024-07-07 NOTE — Telephone Encounter (Signed)
 FYI Only or Action Required?: FYI only for provider.  Patient was last seen in primary care on 04/07/2024 by Antonio Meth, [name], DO.  Called Nurse Triage reporting No chief complaint on file..  Symptoms began today.  Interventions attempted: Nothing.  Symptoms are: unsure.  Triage Disposition: Go to ED Now (or PCP Triage)  Patient/caregiver understands and will follow disposition?: Caller is not with patient- but going to residence now. Unable to triage- advised ED if patient is requesting    Reason for Disposition  Patient sounds very sick or weak to the triager    Patient has called [facility]- they are not with patient- has advised he would like to go to ED- not feeling well-they are not with patient. Advised Take him. Unable to properly triage.  Answer Assessment - Initial Assessment Questions 1. DESCRIPTION: Describe your dizziness.     Dizziness, trouble seeing, shaky 2. LIGHTHEADED: Do you feel lightheaded? (e.g., somewhat faint, woozy, weak upon standing)     dizziness  4. SEVERITY: How bad is it?  Do you feel like you are going to faint? Can you stand and walk?     unsure 5. ONSET:  When did the dizziness begin?     This morning  7. HEART RATE: Can you tell me your heart rate? How many beats in 15 seconds?  (Note: Not all patients can do this.)       unsure 8. CAUSE: What do you think is causing the dizziness? (e.g., decreased fluids or food, diarrhea, emotional distress, heat exposure, new medicine, sudden standing, vomiting; unknown)     unsure 9. RECURRENT SYMPTOM: Have you had dizziness before? If Yes, ask: When was the last time? What happened that time?     Unsure 10. OTHER SYMPTOMS: Do you have any other symptoms? (e.g., fever, chest pain, vomiting, diarrhea, bleeding)       Trouble with vision  Protocols used: Dizziness - Lightheadedness-A-AH    Copied from CRM [id number]. Topic: Clinical - Red Word Triage >> Jul 07, 2024  9:38 AM  Berneda FALCON wrote: Red Word that prompted transfer to Nurse Triage: POA Clara Idell is calling in on behalf of patient. States he woke up this morning stating that he was having trouble with his vision, feeling shaky, and had some problems walking as well.

## 2024-07-07 NOTE — ED Triage Notes (Addendum)
 States he has had increasing weakness in bilateral legs over the past few days, worse today. Blurred vision starting at appx 0430. Stroke screen negative. States he feels he could be dehydrated, doing a lot of work in the kitchen the past few days, decreased sleep.  Hx a.fib, takes eliquis .

## 2024-07-07 NOTE — Discharge Instructions (Signed)
 Follow up with Dr. Antonio for recheck.  Return if any problems.

## 2024-07-07 NOTE — ED Provider Notes (Signed)
 [facility] Provider Note   CSN: [id number] Arrival date & time: 07/07/24  1048     Patient presents with: Blurred Vision   Mark Stark. is a 89 y.o. male.   Pt complains of increased weakness.  Patient reports that his legs have felt weak today.  Patient reports that he recently has had increased activity.  Patient states that he bought 200 pounds of tomatoes and has been canning tomato juice.  Patient reports that he does not think he has been drinking enough fluids.  Patient also states that he does not feel like he gets enough protein in his diet.  Patient reports he eats a lot of vegetables.  Patient also reports that he has been taking a sleeping medication since 1982.  Patient reports he has been having difficulty getting the medication filled.  He asked to be tapered off of the medication.  The PA that patient sees provided patient with a taper schedule.  Patient reports he has been having difficulty sleeping.  Patient denies any fever or chills he has not had any abdominal pain.  Patient denies any falls he has not had any injuries.  Patient has not had any chest pain he has not had any shortness of breath he denies any abdominal pain.  Patient has a past medical history of atrial fibrillation.  He states that his rate.  He has taking his medications today.        Prior to Admission medications   Medication Sig Start Date End Date Taking? Authorizing Provider  clonazePAM  (KLONOPIN ) 0.5 MG tablet Take 1 tablet (0.5 mg total) by mouth at bedtime. 07/07/24 08/06/24 Yes Michaila Kenney K, PA-C  acetaminophen  (TYLENOL ) 325 MG tablet Take 2 tablets (650 mg total) by mouth every 6 (six) hours as needed for mild pain or moderate pain (or Fever >/= 101). 05/29/23   Arrien, Elidia Sieving, MD  apixaban  (ELIQUIS ) 5 MG TABS tablet Take 1 tablet (5 mg total) by mouth 2 (two) times daily. 04/07/24   Lowne Chase, Yvonne R, DO  b complex vitamins tablet  Take 1 tablet by mouth daily.    [provider]  Calcium  Carbonate-Vitamin D  600-400 MG-UNIT tablet Take 1 tablet by mouth daily.    [provider]  clorazepate  (TRANXENE ) 7.5 MG tablet TAKE 1 TABLET (7.5 MG TOTAL) BY MOUTH 2 (TWO) TIMES DAILY AS NEEDED FOR ANXIETY. Patient taking differently: Take 0.5 mg by mouth as needed for anxiety. Pt takes half of a tablet nightly 03/19/24   Webb, Padonda B, FNP  cromolyn  (NASALCROM ) 5.2 MG/ACT nasal spray Place 2 sprays into both nostrils at bedtime. 06/16/24   Antonio Cyndee Jamee JONELLE, DO  fenofibrate  160 MG tablet Take 1 tablet (160 mg total) by mouth daily. 12/28/23   Lowne Chase, Yvonne R, DO  fish oil-omega-3 fatty acids  1000 MG capsule Take 1 g by mouth 2 (two) times daily.    [provider]  gabapentin  (NEURONTIN ) 100 MG capsule Take 2 capsules (200 mg total) by mouth at bedtime. 04/07/24   Lowne Chase, Yvonne R, DO  JARDIANCE  10 MG TABS tablet TAKE 1 TABLET BY MOUTH EVERY DAY 02/19/24   Antonio Cyndee, Yvonne R, DO  latanoprost  (XALATAN ) 0.005 % ophthalmic solution Place 1 drop into the left eye at bedtime. 07/09/23   Fargo, Amy E, NP  magic mouthwash w/lidocaine  SOLN Take 5 mLs by mouth 4 (four) times daily as needed for mouth pain. Swish  and Spit 05/15/23   Lowne Chase, Yvonne R, DO  Multiple Vitamin (THEREMS PO) Take 1 tablet by mouth daily.    [provider]  Multiple Vitamins-Minerals (PRESERVISION AREDS 2) CAPS 2 po qd 07/05/17   Antonio Meth, Yvonne R, DO  simvastatin  (ZOCOR ) 10 MG tablet Take 1 tablet (10 mg total) by mouth at bedtime. 04/07/24   Antonio Meth Rockers R, DO  spironolactone  (ALDACTONE ) 25 MG tablet TAKE 1/2 TABLET BY MOUTH EVERY DAY 04/10/24   Antonio Meth, Yvonne R, DO  tamsulosin  (FLOMAX ) 0.4 MG CAPS capsule Take 1 capsule (0.4 mg total) by mouth daily. 07/09/23   Fargo, Amy E, NP  torsemide  (DEMADEX ) 20 MG tablet Take 1 tablet (20 mg total) by mouth daily. In case of weight gain 2 to 3 lbs in 24 hr or 5 lbs  in 7 days take 2 tablets daily until weight back to baseline. 02/15/24   Lonni Slain, MD  vitamin C  (ASCORBIC ACID ) 500 MG tablet Take 500 mg by mouth daily.    [provider]    Allergies: Alpha-gal, Sulfamethoxazole, and Sulfonamide derivatives    Review of Systems  Neurological:  Positive for weakness.  All other systems reviewed and are negative.   Updated Vital Signs BP 103/82   Pulse (!) 118   Temp 97.7 F (36.5 C)   Resp (!) 23   Ht 5' 6 (1.676 m)   Wt 64.4 kg   SpO2 99%   BMI 22.92 kg/m   Physical Exam Vitals and nursing note reviewed.  Constitutional:      Appearance: He is well-developed.  HENT:     Head: Normocephalic.     Nose: Nose normal.     Mouth/Throat:     Mouth: Mucous membranes are moist.  Eyes:     Pupils: Pupils are equal, round, and reactive to light.  Cardiovascular:     Rate and Rhythm: Normal rate.  Pulmonary:     Effort: Pulmonary effort is normal.  Abdominal:     General: There is no distension.  Musculoskeletal:        General: Normal range of motion.     Cervical back: Normal range of motion.  Skin:    General: Skin is warm.  Neurological:     General: No focal deficit present.     Mental Status: He is alert and oriented to person, place, and time.     Cranial Nerves: No cranial nerve deficit.     Sensory: No sensory deficit.     Motor: No weakness.     Coordination: Coordination normal.     Gait: Gait normal.  Psychiatric:        Mood and Affect: Mood normal.     (all labs ordered are listed, but only abnormal results are displayed) Labs Reviewed  CBC WITH DIFFERENTIAL/PLATELET - Abnormal; Notable for the following components:      Result Value   Monocytes Absolute 1.2 (*)    Abs Immature Granulocytes 0.09 (*)    All other components within normal limits  COMPREHENSIVE METABOLIC PANEL WITH GFR - Abnormal; Notable for the following components:   Sodium 134 (*)    BUN 33 (*)    Creatinine, Ser 1.42  (*)    Total Protein 6.2 (*)    AST 42 (*)    Total Bilirubin 1.6 (*)    GFR, Estimated 46 (*)    All other components within normal limits  URINALYSIS, ROUTINE W REFLEX MICROSCOPIC - Abnormal;  Notable for the following components:   Glucose, UA 100 (*)    All other components within normal limits  PROTIME-INR - Abnormal; Notable for the following components:   Prothrombin Time 15.7 (*)    All other components within normal limits    EKG: EKG Interpretation Date/Time:  Monday July 07 2024 11:01:49 EDT Ventricular Rate:  118 PR Interval:    QRS Duration:  75 QT Interval:  328 QTC Calculation: 460 R Axis:   47  Text Interpretation: Atrial fibrillation No significant change since last tracing Confirmed by Dean Clarity [phone] on 07/07/2024 11:06:53 AM  Radiology: CT Head Wo Contrast Result Date: 07/07/2024 CLINICAL DATA:  Neuro deficit, acute, stroke suspected states he has had increasing weakness in bilateral legs over the past few days, worse today. Blurred vision starting at appx 0430. Stroke screen negative. States he feels he could be dehydrated, doing a lot of work in the kitchen EXAM: CT HEAD WITHOUT CONTRAST TECHNIQUE: Contiguous axial images were obtained from the base of the skull through the vertex without intravenous contrast. RADIATION DOSE REDUCTION: This exam was performed according to the departmental dose-optimization program which includes automated exposure control, adjustment of the mA and/or kV according to patient size and/or use of iterative reconstruction technique. COMPARISON:  CT head 11/24/2022 FINDINGS: Brain: Cerebral ventricle sizes are concordant with the degree of cerebral volume loss. Patchy and confluent areas of decreased attenuation are noted throughout the deep and periventricular white matter of the cerebral hemispheres bilaterally, compatible with chronic microvascular ischemic disease. No evidence of large-territorial acute infarction. No parenchymal  hemorrhage. No mass lesion. No extra-axial collection. No mass effect or midline shift. No hydrocephalus. Basilar cisterns are patent. Vascular: No hyperdense vessel. Atherosclerotic calcifications are present within the cavernous internal carotid arteries. Skull: No acute fracture or focal lesion. Sinuses/Orbits: Paranasal sinuses and mastoid air cells are clear. Bilateral lens replacement. Otherwise the orbits are unremarkable. Other: None. IMPRESSION: No acute intracranial abnormality. Electronically Signed   By: Morgane  Naveau M.D.   On: 07/07/2024 13:01     Procedures   Medications Ordered in the ED  sodium chloride  0.9 % bolus 500 mL (500 mLs Intravenous New Bag/Given 07/07/24 1247)  sodium chloride  0.9 % bolus 500 mL (500 mLs Intravenous New Bag/Given 07/07/24 1455)                                    Medical Decision Making Patient complains of generalized weakness and fatigue.  Patient reports he has not been sleeping well.  He has also been canning tomato juice and has had more activity than usual.  Amount and/or Complexity of Data Reviewed Independent Historian:     Details: Patient is here with a care provider. External Data Reviewed: notes.    Details: Primary care notes reviewed Labs: ordered. Decision-making details documented in ED Course.    Details: Labs ordered reviewed and interpreted.  BUN is 33 creatinine is 1.42.  Radiology: ordered. ECG/medicine tests: ordered and independent interpretation performed. Decision-making details documented in ED Course.    Details: EKG atrial fibrillation   Risk Prescription drug management. Risk Details: Dr. Havlin into see and examine patient.  She discussed symptoms with patient.  Patient may be slightly dehydrated he is given 700 cc of IV fluid.  Orthostatics are normal.  Patient is able to ambulate without difficulty.  I spoke with the pharmacist here at the outpatient pharmacy who  advised that they do not keep Tranxene .  I spoke  to the emergency department pharmacist Vern[place] who compared equivalent medications.  She advised clonazepam  0.5 mg at bedtime.  Patient is given a prescription.  I have advised him to call his primary care provider to make an appointment to be seen for further evaluation.  Patient is discharged in stable condition.        Final diagnoses:  Dehydration  Insomnia, unspecified type    ED Discharge Orders          Ordered    clonazePAM  (KLONOPIN ) 0.5 MG tablet  Daily at bedtime        07/07/24 1525               [place] Sonny POUR, PA-C 07/07/24 1607    Dean Clarity, MD 07/08/24 [phone]

## 2024-07-09 ENCOUNTER — Other Ambulatory Visit: Payer: Self-pay | Admitting: Family Medicine

## 2024-07-09 DIAGNOSIS — E785 Hyperlipidemia, unspecified: Secondary | ICD-10-CM

## 2024-07-15 ENCOUNTER — Encounter: Payer: Self-pay | Admitting: Family Medicine

## 2024-07-15 ENCOUNTER — Ambulatory Visit (INDEPENDENT_AMBULATORY_CARE_PROVIDER_SITE_OTHER): Admitting: Family Medicine

## 2024-07-15 VITALS — BP 110/70 | Temp 97.8°F | Resp 18 | Ht 66.0 in | Wt 151.8 lb

## 2024-07-15 DIAGNOSIS — F5101 Primary insomnia: Secondary | ICD-10-CM

## 2024-07-15 DIAGNOSIS — E860 Dehydration: Secondary | ICD-10-CM | POA: Diagnosis not present

## 2024-07-15 MED ORDER — CLONAZEPAM 0.5 MG PO TABS: 0.5000 mg | ORAL_TABLET | Freq: Every day | ORAL | 1 refills | Status: DC

## 2024-07-15 NOTE — Patient Instructions (Signed)
 Insomnia Insomnia is a sleep disorder that makes it difficult to fall asleep or stay asleep. Insomnia can cause fatigue, low energy, difficulty concentrating, mood swings, and poor performance at work or school. There are three different ways to classify insomnia: Difficulty falling asleep. Difficulty staying asleep. Waking up too early in the morning. Any type of insomnia can be long-term (chronic) or short-term (acute). Both are common. Short-term insomnia usually lasts for 3 months or less. Chronic insomnia occurs at least three times a week for longer than 3 months. What are the causes? Insomnia may be caused by another condition, situation, or substance, such as: Having certain mental health conditions, such as anxiety and depression. Using caffeine, alcohol, tobacco, or drugs. Having gastrointestinal conditions, such as gastroesophageal reflux disease (GERD). Having certain medical conditions. These include: Asthma. Alzheimer's disease. Stroke. Chronic pain. An overactive thyroid gland (hyperthyroidism). Other sleep disorders, such as restless legs syndrome and sleep apnea. Menopause. Sometimes, the cause of insomnia may not be known. What increases the risk? Risk factors for insomnia include: Gender. Females are affected more often than males. Age. Insomnia is more common as people get older. Stress and certain medical and mental health conditions. Lack of exercise. Having an irregular work schedule. This may include working night shifts and traveling between different time zones. What are the signs or symptoms? If you have insomnia, the main symptom is having trouble falling asleep or having trouble staying asleep. This may lead to other symptoms, such as: Feeling tired or having low energy. Feeling nervous about going to sleep. Not feeling rested in the morning. Having trouble concentrating. Feeling irritable, anxious, or depressed. How is this diagnosed? This condition  may be diagnosed based on: Your symptoms and medical history. Your health care provider may ask about: Your sleep habits. Any medical conditions you have. Your mental health. A physical exam. How is this treated? Treatment for insomnia depends on the cause. Treatment may focus on treating an underlying condition that is causing the insomnia. Treatment may also include: Medicines to help you sleep. Counseling or therapy. Lifestyle adjustments to help you sleep better. Follow these instructions at home: Eating and drinking  Limit or avoid alcohol, caffeinated beverages, and products that contain nicotine and tobacco, especially close to bedtime. These can disrupt your sleep. Do not eat a large meal or eat spicy foods right before bedtime. This can lead to digestive discomfort that can make it hard for you to sleep. Sleep habits  Keep a sleep diary to help you and your health care provider figure out what could be causing your insomnia. Write down: When you sleep. When you wake up during the night. How well you sleep and how rested you feel the next day. Any side effects of medicines you are taking. What you eat and drink. Make your bedroom a dark, comfortable place where it is easy to fall asleep. Put up shades or blackout curtains to block light from outside. Use a white noise machine to block noise. Keep the temperature cool. Limit screen use before bedtime. This includes: Not watching TV. Not using your smartphone, tablet, or computer. Stick to a routine that includes going to bed and waking up at the same times every day and night. This can help you fall asleep faster. Consider making a quiet activity, such as reading, part of your nighttime routine. Try to avoid taking naps during the day so that you sleep better at night. Get out of bed if you are still awake after  15 minutes of trying to sleep. Keep the lights down, but try reading or doing a quiet activity. When you feel  sleepy, go back to bed. General instructions Take over-the-counter and prescription medicines only as told by your health care provider. Exercise regularly as told by your health care provider. However, avoid exercising in the hours right before bedtime. Use relaxation techniques to manage stress. Ask your health care provider to suggest some techniques that may work well for you. These may include: Breathing exercises. Routines to release muscle tension. Visualizing peaceful scenes. Make sure that you drive carefully. Do not drive if you feel very sleepy. Keep all follow-up visits. This is important. Contact a health care provider if: You are tired throughout the day. You have trouble in your daily routine due to sleepiness. You continue to have sleep problems, or your sleep problems get worse. Get help right away if: You have thoughts about hurting yourself or someone else. Get help right away if you feel like you may hurt yourself or others, or have thoughts about taking your own life. Go to your nearest emergency room or: Call 911. Call the National Suicide Prevention Lifeline at (906)021-1611 or 988. This is open 24 hours a day. Text the Crisis Text Line at 325-069-2793. Summary Insomnia is a sleep disorder that makes it difficult to fall asleep or stay asleep. Insomnia can be long-term (chronic) or short-term (acute). Treatment for insomnia depends on the cause. Treatment may focus on treating an underlying condition that is causing the insomnia. Keep a sleep diary to help you and your health care provider figure out what could be causing your insomnia. This information is not intended to replace advice given to you by your health care provider. Make sure you discuss any questions you have with your health care provider. Document Revised: 11/21/2021 Document Reviewed: 11/21/2021 Elsevier Patient Education  2024 [employer].

## 2024-07-15 NOTE — Addendum Note (Signed)
 Addended by: TRECIA ALMARIE GRADE on: 07/15/2024 01:08 PM   Modules accepted: Orders

## 2024-07-15 NOTE — Progress Notes (Signed)
 Established Patient Office Visit  Subjective   Patient ID: Mark Kallenbach., male    DOB: 02/25/1928  Age: 89 y.o. MRN: [id number]  Chief Complaint  Patient presents with   ED follow up    HPI Discussed the use of AI scribe software for clinical note transcription with the patient, who gave verbal consent to proceed.  History of Present Illness Mark Clipper. is a 89 year old male who presents with dizziness, malaise, and sleep disturbances.  He has been experiencing dizziness, malaise, and wobbly legs for the past week. A CT scan at the emergency room returned normal results. Despite this, he continues to experience weakness and malaise. He suspects his symptoms may be related to his medication, Jardiance , which he did not take yesterday and subsequently felt better, experiencing a productive day with 'tired in a good way' at the end. He has been on Jardiance  since February.  He has a history of taking chlorazepate for sleep, which he has been weaning off. He transitioned to clonazepam , which allows him to sleep slightly better, waking up at 3 AM to urinate but able to return to sleep for an additional hour or so. He is getting 4 to 5 hours of sleep per night, which is less than the 7 to 8 hours he desires. He experiences frequent urination at night, which he attributes to Jardiance  and possibly torsemide , both diuretics. He also takes spironolactone , another diuretic, which may contribute to dehydration.  He has been on Synthroid since 1982. There was a mention of dehydration in the emergency room, thought to be related to his medication regimen. He drinks a significant amount of water to counteract dehydration. He has been actively involved in making tomato juice and pickled beets, which he acknowledges may have contributed to his fatigue. He has scaled back his activities post-hospital visit to rest and hydrate adequately.  He limits his driving to within half a mile of his house. No  fever. Reports dizziness, malaise, and frequent nighttime urination. Reports feeling weak and wobbly.   Patient Active Problem List   Diagnosis Date Noted   Fall 06/29/2023   Allergy to alpha-gal 06/29/2023   Peripheral neuropathy 06/29/2023   Bilateral hand pain 06/11/2023   Insomnia 06/11/2023   Pilonidal cyst 06/11/2023   Allergic reaction to alpha-gal 06/11/2023   Numbness in both hands 06/01/2023   Hyponatremia 05/25/2023   Cellulitis of right lower extremity 05/22/2023   Tick bite of right axilla with infection 05/15/2023   SOB (shortness of breath) 05/15/2023   Hematoma of left lower leg 01/05/2023   Acute on chronic diastolic CHF (congestive heart failure) (HCC) 08/24/2022   BPH (benign prostatic hyperplasia) 08/23/2022   Generalized weakness 08/22/2022   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   Diverticulitis 08/09/2022   Chronic kidney disease, stage 3a (HCC) 08/09/2022   Primary open angle glaucoma (POAG) of left eye, moderate stage 06/08/2022   Contusion of left orbit 01/05/2022   Chronic diastolic CHF (congestive heart failure) (HCC) 11/09/2021   Nonrheumatic mitral valve regurgitation 10/28/2021   Acute kidney injury superimposed on CKD (HCC) 10/28/2021   Acute diastolic CHF (congestive heart failure) (HCC) 10/25/2021   Secondary hypercoagulable state (HCC) 08/23/2021   Prostate CA (HCC) 07/28/2021   Thrombocytopenia (HCC) 03/30/2021   Screening for HIV (human immunodeficiency virus) 03/30/2021   Monocytosis 03/30/2021   Abnormal platelets (HCC) 03/28/2021   Hip pain 03/28/2021   Dry eyes, bilateral 05/27/2020   Epiretinal membrane (ERM)  of both eyes 12/03/2019   Persistent atrial fibrillation (HCC)    Paroxysmal atrial fibrillation (HCC) 01/12/2018   Cystoid macular edema of right eye 11/29/2017   Macular degeneration 12/21/2015   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 eye with active choroidal neovascularization (HCC) 04/30/2014    Myalgia and myositis 11/16/2013   Epiretinal membrane 11/07/2012   Posterior vitreous detachment 11/07/2012   Status post intraocular lens implant 11/07/2012   Fungal ear infection 04/09/2012   Abnormal laboratory test 03/27/2011   Aortic insufficiency 03/27/2011   SYSTOLIC MURMUR 03/05/2011   EDEMA 12/05/2010   ECCHYMOSES, SPONTANEOUS 11/28/2010   CONTUSION, RIGHT THIGH 11/28/2010   DECREASED HEARING, BILATERAL 09/23/2010   Pain in limb 07/28/2009   ANKLE SPRAIN, LEFT 07/28/2009   CONTUSION, LOWER LEG, LEFT 07/28/2009   DERMATITIS, CONTACT, NOS 10/02/2007   Essential hypertension 05/07/2007   PROSTATE CANCER, HX OF 03/13/2007   Hyperlipidemia 01/15/2007   Anxiety 01/15/2007   H/O oral aphthous ulcers 01/15/2007   Past Medical History:  Diagnosis Date   Acute diastolic CHF (congestive heart failure) (HCC) 10/25/2021   Anxiety    Aortic insufficiency    Atrial fibrillation (HCC)    Chronic diastolic CHF (congestive heart failure) (HCC)    Chronic kidney disease, stage 3a (HCC)    Diverticulitis    Dyslipidemia    Hypertension    Inferior mesenteric vein thrombosis (HCC)    Mitral regurgitation    Nonrheumatic mitral valve regurgitation 10/28/2021   Paroxysmal atrial fibrillation (HCC) 01/12/2018   Persistent atrial fibrillation (HCC)    Prostate cancer [facility])    Past Surgical History:  Procedure Laterality Date   CARDIOVERSION N/A 03/01/2018   Procedure: CARDIOVERSION;  Surgeon: Burnard Debby LABOR, MD;  Location: [facility] ENDOSCOPY;  Service: Cardiovascular;  Laterality: N/A;   CARDIOVERSION N/A 08/26/2021   Procedure: CARDIOVERSION;  Surgeon: Mona Vinie BROCKS, MD;  Location: [facility] ENDOSCOPY;  Service: Cardiovascular;  Laterality: N/A;   CARDIOVERSION N/A 12/06/2021   Procedure: CARDIOVERSION;  Surgeon: Kate Lonni CROME, MD;  Location: [facility] ENDOSCOPY;  Service: Cardiovascular;  Laterality: N/A;   CATARACT EXTRACTION  01/27/2010   right   CATARACT EXTRACTION  03/04/2010   left    Prostate seed implants     PROSTATE SURGERY     Social History   Tobacco Use   Smoking status: Never   Smokeless tobacco: Never  Vaping Use   Vaping status: Never Used  Substance Use Topics   Alcohol use: No    Comment: Little wine occasionally    Drug use: No   Social History   Socioeconomic History   Marital status: Widowed    Spouse name: Not on file   Number of children: Not on file   Years of education: Not on file   Highest education level: Not on file  Occupational History   Occupation: Professor---RETIRED    Employer: RETIRED    Comment: Retired  Tobacco Use   Smoking status: Never   Smokeless tobacco: Never  Vaping Use   Vaping status: Never Used  Substance and Sexual Activity   Alcohol use: No    Comment: Little wine occasionally    Drug use: No   Sexual activity: Never    Partners: Female  Other Topics Concern   Not on file  Social History Narrative   Not on file   Social Drivers of Health   Financial Resource Strain: Low Risk  (02/21/2024)   Overall Financial Resource Strain (CARDIA)    Difficulty of Paying  Living Expenses: Not hard at all  Food Insecurity: No Food Insecurity (02/21/2024)   Hunger Vital Sign    Worried About Running Out of Food in the Last Year: Never true    Ran Out of Food in the Last Year: Never true  Transportation Needs: No Transportation Needs (02/21/2024)   PRAPARE - Administrator, Civil Service (Medical): No    Lack of Transportation (Non-Medical): No  Physical Activity: Inactive (02/21/2024)   Exercise Vital Sign    Days of Exercise per Week: 0 days    Minutes of Exercise per Session: 0 min  Stress: No Stress Concern Present (02/21/2024)   [employer] of Occupational Health - Occupational Stress Questionnaire    Feeling of Stress : Only a little  Social Connections: Moderately Isolated (02/21/2024)   Social Connection and Isolation Panel    Frequency of Communication with Friends and Family: More than  three times a week    Frequency of Social Gatherings with Friends and Family: Three times a week    Attends Religious Services: More than 4 times per year    Active Member of Clubs or Organizations: No    Attends Banker Meetings: Never    Marital Status: Widowed  Intimate Partner Violence: Not At Risk (02/21/2024)   Humiliation, Afraid, Rape, and Kick questionnaire    Fear of Current or Ex-Partner: No    Emotionally Abused: No    Physically Abused: No    Sexually Abused: No   Family Status  Relation Name Status   Mother  Deceased   Father  Deceased   Brother  Deceased   MGM  Deceased   MGF  Deceased   PGM  Deceased   PGF  Deceased  No partnership data on file   Family History  Problem Relation Age of Onset   Dementia Mother    Alzheimer's disease Mother    Mental illness Mother        ALZ DISEASE   Cancer Brother 57       pancreatic cancer   Allergies  Allergen Reactions   Alpha-Gal Other (See Comments)    Avoidance of meat, dairy products, and other products that contain alpha-gal. Individuals diagnosed with AGS should completely avoid the consumption of mammalian meat, including beef, pork, lamb, venison, goat, and rabbit.    Sulfamethoxazole Other (See Comments)    sulfamethoxazole   Sulfonamide Derivatives Other (See Comments)    Unknown - patient cannot remember the reaction      Review of Systems  Constitutional:  Positive for malaise/fatigue. Negative for fever.  HENT:  Negative for congestion.   Eyes:  Negative for blurred vision.  Respiratory:  Negative for cough and shortness of breath.   Cardiovascular:  Negative for chest pain, palpitations and leg swelling.  Gastrointestinal:  Negative for abdominal pain, blood in stool, nausea and vomiting.  Genitourinary:  Negative for dysuria and frequency.  Musculoskeletal:  Negative for back pain and falls.  Skin:  Negative for rash.  Neurological:  Positive for weakness. Negative for dizziness,  loss of consciousness and headaches.  Endo/Heme/Allergies:  Negative for environmental allergies.  Psychiatric/Behavioral:  Negative for depression. The patient is not nervous/anxious.       Objective:     BP 110/70 (BP Location: Left Arm, Patient Position: Sitting, Cuff Size: Normal)   Temp 97.8 F (36.6 C) (Oral)   Resp 18   Ht 5' 6 (1.676 m)   Wt 151 lb 12.8 oz (68.9  kg)   BMI 24.50 kg/m  BP Readings from Last 3 Encounters:  07/15/24 110/70  07/07/24 103/82  05/26/24 126/70   Wt Readings from Last 3 Encounters:  07/15/24 151 lb 12.8 oz (68.9 kg)  07/07/24 142 lb (64.4 kg)  05/26/24 150 lb (68 kg)   SpO2 Readings from Last 3 Encounters:  07/07/24 98%  05/26/24 96%  04/07/24 99%      Physical Exam Vitals and nursing note reviewed.  Constitutional:      General: He is not in acute distress.    Appearance: Normal appearance. He is well-developed.  HENT:     Head: Normocephalic and atraumatic.  Eyes:     General: No scleral icterus.       Right eye: No discharge.        Left eye: No discharge.  Cardiovascular:     Rate and Rhythm: Normal rate and regular rhythm.     Heart sounds: No murmur heard. Pulmonary:     Effort: Pulmonary effort is normal. No respiratory distress.     Breath sounds: Normal breath sounds.  Musculoskeletal:        General: Normal range of motion.     Cervical back: Normal range of motion and neck supple.     Right lower leg: No edema.     Left lower leg: No edema.  Skin:    General: Skin is warm and dry.  Neurological:     Mental Status: He is alert and oriented to person, place, and time.  Psychiatric:        Mood and Affect: Mood normal.        Behavior: Behavior normal.        Thought Content: Thought content normal.        Judgment: Judgment normal.      No results found for any visits on 07/15/24.  Last CBC Lab Results  Component Value Date   WBC 7.8 07/07/2024   HGB 15.4 07/07/2024   HCT 45.8 07/07/2024   MCV 94.2  07/07/2024   MCH 31.7 07/07/2024   RDW 15.4 07/07/2024   PLT 162 07/07/2024   Last metabolic panel Lab Results  Component Value Date   GLUCOSE 96 07/07/2024   NA 134 (L) 07/07/2024   K 4.5 07/07/2024   CL 98 07/07/2024   CO2 24 07/07/2024   BUN 33 (H) 07/07/2024   CREATININE 1.42 (H) 07/07/2024   GFRNONAA 46 (L) 07/07/2024   CALCIUM  9.0 07/07/2024   PHOS 4.1 08/23/2022   PROT 6.2 (L) 07/07/2024   ALBUMIN 4.1 07/07/2024   BILITOT 1.6 (H) 07/07/2024   ALKPHOS 52 07/07/2024   AST 42 (H) 07/07/2024   ALT 24 07/07/2024   ANIONGAP 12 07/07/2024   Last lipids Lab Results  Component Value Date   CHOL 111 04/07/2024   HDL 47.20 04/07/2024   LDLCALC 49 04/07/2024   TRIG 75.0 04/07/2024   CHOLHDL 2 04/07/2024   Last hemoglobin A1c Lab Results  Component Value Date   HGBA1C 5.0 05/21/2012   Last thyroid  functions Lab Results  Component Value Date   TSH 1.498 05/25/2023   Last vitamin D  No results found for: 25OHVITD2, 25OHVITD3, VD25OH Last vitamin B12 and Folate Lab Results  Component Value Date   VITAMINB12 484 06/06/2023      The ASCVD Risk score (Arnett DK, et al., 2019) failed to calculate for the following reasons:   The 2019 ASCVD risk score is only valid for ages 12 to 76  Assessment & Plan:   Problem List Items Addressed This Visit       Unprioritized   Insomnia   Relevant Medications   clonazePAM  (KLONOPIN ) 0.5 MG tablet   Other Visit Diagnoses       Dehydration    -  Primary   Relevant Orders   Comprehensive metabolic panel with GFR     Assessment and Plan Assessment & Plan Malaise and Dizziness   He reports experiencing malaise, fatigue, dizziness, and unsteady gait for one week. A CT scan and other tests in the ER were normal. Jardiance  is suspected to contribute to these symptoms, as he improved after skipping a dose. Dehydration, possibly due to diuretic use, was noted. While Jardiance  has cardiovascular benefits, its side  effects may outweigh the benefits in this case. Hold Jardiance  for one week to assess symptom improvement. Recheck kidney function today to evaluate dehydration improvement and ensure adequate hydration.  Insomnia   He experiences nocturia at 3 AM, leading to difficulty returning to sleep and resulting in 4-5 hours of sleep per night. Previously used chlorazepate, which is now difficult to obtain. Clonazepam  was prescribed as an alternative, but early awakening persists. Diuretics, including Jardiance , torsemide , and spironolactone , may contribute to nocturia. Sedating medications pose fall risks, especially in the elderly. Continue clonazepam  and assess sleep improvement. Transfer clonazepam  prescription to [facility] on [address]. Hold Jardiance  for one week to assess its impact on nocturia and sleep.  Polypharmacy and Diuretic Use   He is on multiple diuretics, including torsemide  and spironolactone , which may contribute to dehydration and nocturia. Careful monitoring of kidney function and hydration status is required. Discussed balancing medication benefits with potential side effects, especially given his age. Recheck kidney function today to evaluate dehydration improvement and ensure adequate hydration. Hold Jardiance  for one week to assess symptom improvement.  Goals of Care   At 89 years old, the appropriateness of continuing certain medications, such as chlorazepate, was discussed, given his age and difficulty obtaining it. He values sleep and is cautious about fall risks associated with sedating medications. Emphasized balancing medication benefits with quality of life and safety. Discuss risks and benefits of continuing clonazepam  versus chlorazepate. Ensure awareness of fall risks associated with sedating medications.  Follow-up   Follow-up is required to assess the impact of holding Jardiance  and to monitor kidney function and sleep quality. Will communicate with Dr. Lonni if symptoms  improve after holding Jardiance . Follow up in 3-4 weeks to assess symptoms and kidney function. Communicate with Dr. Lonni if symptoms improve after holding Jardiance .  `  Return in about 3 weeks (around 08/05/2024).    Obera Stauch R Lowne Chase, DO

## 2024-07-16 ENCOUNTER — Other Ambulatory Visit (INDEPENDENT_AMBULATORY_CARE_PROVIDER_SITE_OTHER)

## 2024-07-16 DIAGNOSIS — E860 Dehydration: Secondary | ICD-10-CM

## 2024-07-16 LAB — COMPREHENSIVE METABOLIC PANEL WITH GFR
ALT: 21 U/L (ref 0–53)
AST: 30 U/L (ref 0–37)
Albumin: 4.2 g/dL (ref 3.5–5.2)
Alkaline Phosphatase: 45 U/L (ref 39–117)
BUN: 30 mg/dL — ABNORMAL HIGH (ref 6–23)
CO2: 30 meq/L (ref 19–32)
Calcium: 9.3 mg/dL (ref 8.4–10.5)
Chloride: 95 meq/L — ABNORMAL LOW (ref 96–112)
Creatinine, Ser: 1.53 mg/dL — ABNORMAL HIGH (ref 0.40–1.50)
GFR: 38.8 mL/min — ABNORMAL LOW (ref >60.00)
Glucose, Bld: 93 mg/dL (ref 70–99)
Potassium: 4.6 meq/L (ref 3.5–5.1)
Sodium: 132 meq/L — ABNORMAL LOW (ref 135–145)
Total Bilirubin: 2.1 mg/dL — ABNORMAL HIGH (ref 0.2–1.2)
Total Protein: 6.6 g/dL (ref 6.0–8.3)

## 2024-07-20 ENCOUNTER — Ambulatory Visit: Payer: Self-pay | Admitting: Family Medicine

## 2024-07-20 DIAGNOSIS — N1832 Chronic kidney disease, stage 3b: Secondary | ICD-10-CM

## 2024-08-04 ENCOUNTER — Other Ambulatory Visit: Payer: Self-pay | Admitting: Family

## 2024-08-04 DIAGNOSIS — F419 Anxiety disorder, unspecified: Secondary | ICD-10-CM

## 2024-08-07 DIAGNOSIS — H353231 Exudative age-related macular degeneration, bilateral, with active choroidal neovascularization: Secondary | ICD-10-CM | POA: Diagnosis not present

## 2024-08-11 ENCOUNTER — Ambulatory Visit (INDEPENDENT_AMBULATORY_CARE_PROVIDER_SITE_OTHER): Admitting: Family Medicine

## 2024-08-11 ENCOUNTER — Encounter: Payer: Self-pay | Admitting: Family Medicine

## 2024-08-11 VITALS — BP 108/80 | HR 61 | Temp 97.8°F | Resp 18 | Ht 66.0 in | Wt 156.2 lb

## 2024-08-11 DIAGNOSIS — I5032 Chronic diastolic (congestive) heart failure: Secondary | ICD-10-CM

## 2024-08-11 DIAGNOSIS — N1832 Chronic kidney disease, stage 3b: Secondary | ICD-10-CM | POA: Diagnosis not present

## 2024-08-11 DIAGNOSIS — B372 Candidiasis of skin and nail: Secondary | ICD-10-CM | POA: Diagnosis not present

## 2024-08-11 LAB — COMPREHENSIVE METABOLIC PANEL WITH GFR
ALT: 17 U/L (ref 0–53)
AST: 27 U/L (ref 0–37)
Albumin: 4.4 g/dL (ref 3.5–5.2)
Alkaline Phosphatase: 45 U/L (ref 39–117)
BUN: 29 mg/dL — ABNORMAL HIGH (ref 6–23)
CO2: 29 meq/L (ref 19–32)
Calcium: 9.6 mg/dL (ref 8.4–10.5)
Chloride: 95 meq/L — ABNORMAL LOW (ref 96–112)
Creatinine, Ser: 1.5 mg/dL (ref 0.40–1.50)
GFR: 39.72 mL/min — ABNORMAL LOW (ref >60.00)
Glucose, Bld: 96 mg/dL (ref 70–99)
Potassium: 5.1 meq/L (ref 3.5–5.1)
Sodium: 133 meq/L — ABNORMAL LOW (ref 135–145)
Total Bilirubin: 1.4 mg/dL — ABNORMAL HIGH (ref 0.2–1.2)
Total Protein: 6.5 g/dL (ref 6.0–8.3)

## 2024-08-11 MED ORDER — NYSTATIN 100000 UNIT/GM EX CREA: 1.0000 | TOPICAL_CREAM | Freq: Two times a day (BID) | CUTANEOUS | 0 refills | Status: DC

## 2024-08-11 MED ORDER — EMPAGLIFLOZIN 10 MG PO TABS
10.0000 mg | ORAL_TABLET | Freq: Every day | ORAL | Status: DC
Start: 2024-08-11 — End: 2024-08-15

## 2024-08-11 NOTE — Progress Notes (Signed)
 Subjective:    Patient ID: Mark Stark., male    DOB: 1928/12/06, 89 y.o.   MRN: [id number]  Chief Complaint  Patient presents with   Dehydration   Follow-up    HPI Patient is in today for f/u Discussed the use of AI scribe software for clinical note transcription with the patient, who gave verbal consent to proceed.  History of Present Illness Mark Stark. is a 89 year old male with congestive heart failure and prostate cancer who presents with concerns about medication side effects and a hernia.  He experiences frequent urination every ten to fifteen minutes due to diuretic use, leading to dehydration during medical visits. He has been off Jardiance  for 26 days, which he believes has reduced his malaise but has led to increased shortness of breath, heavy legs, and swollen ankles. Previously, while on Jardiance , he experienced a urinary tract yeast infection, which resolved after discontinuation of the medication. He is concerned about protecting his kidneys and is considering resuming Jardiance .  He is currently taking torsemide , which he associates with low blood pressure and malaise. He reports fatigue and malaise, which he is willing to tolerate as long as they are identified.  He has a diminished sense of taste and smell, which fluctuates in severity. He also reports a small left-sided hernia that has recently begun to bulge but is not painful. He is mindful of the risks of anesthesia at his age.  He has a history of prostate cancer diagnosed in 2008, with PSA levels that have been rising recently, reaching 4.0. He is concerned about the potential for metastasis and is awaiting further evaluation.  He is experiencing difficulty obtaining flurazepam, which he uses to improve his sleep by approximately two hours. He wakes up to urinate but is able to return to sleep, which he finds beneficial.  He requests a handicap sticker due to slow walking.   Past Medical History:   Diagnosis Date   Acute diastolic CHF (congestive heart failure) (HCC) 10/25/2021   Anxiety    Aortic insufficiency    Atrial fibrillation (HCC)    Chronic diastolic CHF (congestive heart failure) (HCC)    Chronic kidney disease, stage 3a (HCC)    Diverticulitis    Dyslipidemia    Hypertension    Inferior mesenteric vein thrombosis (HCC)    Mitral regurgitation    Nonrheumatic mitral valve regurgitation 10/28/2021   Paroxysmal atrial fibrillation (HCC) 01/12/2018   Persistent atrial fibrillation (HCC)    Prostate cancer [facility])     Past Surgical History:  Procedure Laterality Date   CARDIOVERSION N/A 03/01/2018   Procedure: CARDIOVERSION;  Surgeon: Burnard Debby LABOR, MD;  Location: [facility] ENDOSCOPY;  Service: Cardiovascular;  Laterality: N/A;   CARDIOVERSION N/A 08/26/2021   Procedure: CARDIOVERSION;  Surgeon: Mona Vinie BROCKS, MD;  Location: [facility] ENDOSCOPY;  Service: Cardiovascular;  Laterality: N/A;   CARDIOVERSION N/A 12/06/2021   Procedure: CARDIOVERSION;  Surgeon: Kate Lonni CROME, MD;  Location: [facility] ENDOSCOPY;  Service: Cardiovascular;  Laterality: N/A;   CATARACT EXTRACTION  01/27/2010   right   CATARACT EXTRACTION  03/04/2010   left   Prostate seed implants     PROSTATE SURGERY      Family History  Problem Relation Age of Onset   Dementia Mother    Alzheimer's disease Mother    Mental illness Mother        ALZ DISEASE   Cancer Brother 23       pancreatic cancer  Social History   Socioeconomic History   Marital status: Widowed    Spouse name: Not on file   Number of children: Not on file   Years of education: Not on file   Highest education level: Not on file  Occupational History   Occupation: Professor---RETIRED    Employer: RETIRED    Comment: Retired  Tobacco Use   Smoking status: Never   Smokeless tobacco: Never  Vaping Use   Vaping status: Never Used  Substance and Sexual Activity   Alcohol use: No    Comment: Little wine occasionally    Drug use:  No   Sexual activity: Never    Partners: Female  Other Topics Concern   Not on file  Social History Narrative   Not on file   Social Drivers of Health   Financial Resource Strain: Low Risk  (02/21/2024)   Overall Financial Resource Strain (CARDIA)    Difficulty of Paying Living Expenses: Not hard at all  Food Insecurity: No Food Insecurity (02/21/2024)   Hunger Vital Sign    Worried About Running Out of Food in the Last Year: Never true    Ran Out of Food in the Last Year: Never true  Transportation Needs: No Transportation Needs (02/21/2024)   PRAPARE - Administrator, Civil Service (Medical): No    Lack of Transportation (Non-Medical): No  Physical Activity: Inactive (02/21/2024)   Exercise Vital Sign    Days of Exercise per Week: 0 days    Minutes of Exercise per Session: 0 min  Stress: No Stress Concern Present (02/21/2024)   [employer] of Occupational Health - Occupational Stress Questionnaire    Feeling of Stress : Only a little  Social Connections: Moderately Isolated (02/21/2024)   Social Connection and Isolation Panel    Frequency of Communication with Friends and Family: More than three times a week    Frequency of Social Gatherings with Friends and Family: Three times a week    Attends Religious Services: More than 4 times per year    Active Member of Clubs or Organizations: No    Attends Banker Meetings: Never    Marital Status: Widowed  Intimate Partner Violence: Not At Risk (02/21/2024)   Humiliation, Afraid, Rape, and Kick questionnaire    Fear of Current or Ex-Partner: No    Emotionally Abused: No    Physically Abused: No    Sexually Abused: No    Outpatient Medications Prior to Visit  Medication Sig Dispense Refill   acetaminophen  (TYLENOL ) 325 MG tablet Take 2 tablets (650 mg total) by mouth every 6 (six) hours as needed for mild pain or moderate pain (or Fever >/= 101).     apixaban  (ELIQUIS ) 5 MG TABS tablet Take 1 tablet (5  mg total) by mouth 2 (two) times daily. 180 tablet 1   b complex vitamins tablet Take 1 tablet by mouth daily.     Calcium  Carbonate-Vitamin D  600-400 MG-UNIT tablet Take 1 tablet by mouth daily.     clonazePAM  (KLONOPIN ) 0.5 MG tablet Take 1 tablet (0.5 mg total) by mouth at bedtime. 30 tablet 1   cromolyn  (NASALCROM ) 5.2 MG/ACT nasal spray Place 2 sprays into both nostrils at bedtime. 26 mL 5   fenofibrate  160 MG tablet TAKE 1 TABLET BY MOUTH EVERY DAY 90 tablet 1   fish oil-omega-3 fatty acids  1000 MG capsule Take 1 g by mouth 2 (two) times daily.     latanoprost  (XALATAN ) 0.005 % ophthalmic  solution Place 1 drop into the left eye at bedtime. 2.5 mL 0   Multiple Vitamin (THEREMS PO) Take 1 tablet by mouth daily.     Multiple Vitamins-Minerals (PRESERVISION AREDS 2) CAPS 2 po qd     simvastatin  (ZOCOR ) 10 MG tablet Take 1 tablet (10 mg total) by mouth at bedtime. 90 tablet 1   spironolactone  (ALDACTONE ) 25 MG tablet TAKE 1/2 TABLET BY MOUTH EVERY DAY 45 tablet 1   tamsulosin  (FLOMAX ) 0.4 MG CAPS capsule Take 1 capsule (0.4 mg total) by mouth daily. 30 capsule 0   torsemide  (DEMADEX ) 20 MG tablet Take 1 tablet (20 mg total) by mouth daily. In case of weight gain 2 to 3 lbs in 24 hr or 5 lbs in 7 days take 2 tablets daily until weight back to baseline. 90 tablet 3   vitamin C  (ASCORBIC ACID ) 500 MG tablet Take 500 mg by mouth daily.     JARDIANCE  10 MG TABS tablet TAKE 1 TABLET BY MOUTH EVERY DAY 90 tablet 1   gabapentin  (NEURONTIN ) 100 MG capsule Take 2 capsules (200 mg total) by mouth at bedtime. (Patient not taking: Reported on 08/11/2024) 180 capsule 1   No facility-administered medications prior to visit.    Allergies  Allergen Reactions   Alpha-Gal Other (See Comments)    Avoidance of meat, dairy products, and other products that contain alpha-gal. Individuals diagnosed with AGS should completely avoid the consumption of mammalian meat, including beef, pork, lamb, venison, goat, and  rabbit.    Sulfamethoxazole Other (See Comments)    sulfamethoxazole   Sulfonamide Derivatives Other (See Comments)    Unknown - patient cannot remember the reaction    Review of Systems  Constitutional:  Negative for fever and malaise/fatigue.  HENT:  Negative for congestion.   Eyes:  Negative for blurred vision.  Respiratory:  Negative for shortness of breath.   Cardiovascular:  Negative for chest pain, palpitations and leg swelling.  Gastrointestinal:  Negative for abdominal pain, blood in stool and nausea.  Genitourinary:  Negative for dysuria and frequency.  Musculoskeletal:  Negative for falls.  Skin:  Negative for rash.  Neurological:  Negative for dizziness, loss of consciousness and headaches.  Endo/Heme/Allergies:  Negative for environmental allergies.  Psychiatric/Behavioral:  Negative for depression. The patient is not nervous/anxious.        Objective:    Physical Exam Vitals and nursing note reviewed.  Constitutional:      General: He is not in acute distress.    Appearance: Normal appearance. He is well-developed.  HENT:     Head: Normocephalic and atraumatic.  Eyes:     General: No scleral icterus.       Right eye: No discharge.        Left eye: No discharge.  Cardiovascular:     Rate and Rhythm: Normal rate and regular rhythm.     Heart sounds: Murmur heard.  Pulmonary:     Effort: Pulmonary effort is normal. No respiratory distress.     Breath sounds: Normal breath sounds.  Musculoskeletal:        General: Normal range of motion.     Cervical back: Normal range of motion and neck supple.     Right lower leg: No edema.     Left lower leg: No edema.  Skin:    General: Skin is warm and dry.  Neurological:     General: No focal deficit present.     Mental Status: He is alert and  oriented to person, place, and time.  Psychiatric:        Mood and Affect: Mood normal.        Behavior: Behavior normal.        Thought Content: Thought content normal.         Judgment: Judgment normal.     BP 108/80 (BP Location: Right Arm, Patient Position: Sitting, Cuff Size: Normal)   Pulse 61   Temp 97.8 F (36.6 C) (Oral)   Resp 18   Ht 5' 6 (1.676 m)   Wt 156 lb 3.2 oz (70.9 kg)   SpO2 98%   BMI 25.21 kg/m  Wt Readings from Last 3 Encounters:  08/11/24 156 lb 3.2 oz (70.9 kg)  07/15/24 151 lb 12.8 oz (68.9 kg)  07/07/24 142 lb (64.4 kg)    Diabetic Foot Exam - Simple   No data filed    Lab Results  Component Value Date   WBC 7.8 07/07/2024   HGB 15.4 07/07/2024   HCT 45.8 07/07/2024   PLT 162 07/07/2024   GLUCOSE 93 07/16/2024   CHOL 111 04/07/2024   TRIG 75.0 04/07/2024   HDL 47.20 04/07/2024   LDLCALC 49 04/07/2024   ALT 21 07/16/2024   AST 30 07/16/2024   NA 132 (L) 07/16/2024   K 4.6 07/16/2024   CL 95 (L) 07/16/2024   CREATININE 1.53 (H) 07/16/2024   BUN 30 (H) 07/16/2024   CO2 30 07/16/2024   TSH 1.498 05/25/2023   PSA 0.35 12/27/2009   INR 1.2 07/07/2024   HGBA1C 5.0 05/21/2012    Lab Results  Component Value Date   TSH 1.498 05/25/2023   Lab Results  Component Value Date   WBC 7.8 07/07/2024   HGB 15.4 07/07/2024   HCT 45.8 07/07/2024   MCV 94.2 07/07/2024   PLT 162 07/07/2024   Lab Results  Component Value Date   NA 132 (L) 07/16/2024   K 4.6 07/16/2024   CO2 30 07/16/2024   GLUCOSE 93 07/16/2024   BUN 30 (H) 07/16/2024   CREATININE 1.53 (H) 07/16/2024   BILITOT 2.1 (H) 07/16/2024   ALKPHOS 45 07/16/2024   AST 30 07/16/2024   ALT 21 07/16/2024   PROT 6.6 07/16/2024   ALBUMIN 4.2 07/16/2024   CALCIUM  9.3 07/16/2024   ANIONGAP 12 07/07/2024   EGFR 44 (L) 05/26/2024   GFR 38.80 (L) 07/16/2024   Lab Results  Component Value Date   CHOL 111 04/07/2024   Lab Results  Component Value Date   HDL 47.20 04/07/2024   Lab Results  Component Value Date   LDLCALC 49 04/07/2024   Lab Results  Component Value Date   TRIG 75.0 04/07/2024   Lab Results  Component Value Date   CHOLHDL 2  04/07/2024   Lab Results  Component Value Date   HGBA1C 5.0 05/21/2012       Assessment & Plan:  Chronic renal impairment, stage 3b (HCC) -     Empagliflozin ; Take 1 tablet (10 mg total) by mouth daily before breakfast. -     Comprehensive metabolic panel with GFR  Chronic diastolic CHF (congestive heart failure) (HCC) -     Empagliflozin ; Take 1 tablet (10 mg total) by mouth daily before breakfast.  Yeast dermatitis -     Nystatin ; Apply 1 Application topically 2 (two) times daily.  Dispense: 30 g; Refill: 0   Assessment and Plan Assessment & Plan Congestive heart failure with lower extremity edema   He experiences  shortness of breath, heavy legs, and swollen ankles, which were less severe while on Jardiance . Diuretics are necessary for fluid retention but cause fatigue and malaise. Jardiance  is cardio-protective and may alleviate symptoms. Continue torsemide  as prescribed by Dr. Lonni. Restart Jardiance  10 mg daily to assess symptom improvement. Monitor for yeast infections and use prescribed cream if needed.  Chronic kidney disease   He is concerned about kidney protection, especially regarding dehydration and medication effects. He has been off Jardiance  for 26 days to evaluate its impact on malaise and kidney function. Jardiance  provides kidney protection. Restart Jardiance  10 mg daily for kidney and heart protection. Recheck kidney function today.  Recurrent cutaneous and genital candidiasis   He experienced a yeast infection while on Jardiance , which resolved after stopping the medication. The infection was cutaneous, affecting the groin and under the foreskin. Yeast infections are a common side effect of Jardiance . Prescribe antifungal cream for use if yeast infection recurs.  Left inguinal hernia   He reports a bulging left inguinal hernia that is not painful and reduces spontaneously. He is concerned about potential surgery due to age and anesthesia risks and prefers  to avoid surgery unless necessary. Recommend consultation with a surgeon for evaluation and documentation of the hernia. Discuss with urologist during the upcoming visit.  Insomnia   He reports improved sleep with flurazepam, though it is increasingly difficult to obtain due to its status as an older, controlled medication. Continue flurazepam as prescribed. Address pharmacy issues as they arise.   Oyinkansola Truax R Lowne Chase, DO

## 2024-08-12 ENCOUNTER — Telehealth: Payer: Self-pay | Admitting: *Deleted

## 2024-08-12 ENCOUNTER — Ambulatory Visit: Payer: Self-pay | Admitting: Family Medicine

## 2024-08-12 NOTE — Telephone Encounter (Signed)
 Copied from CRM #[id number]. Topic: Clinical - Medication Question >> Aug 12, 2024  9:17 AM Martinique E wrote: Reason for CRM: Patient called in stating that he had an appointment with his PCP yesterday and does not feel comfortable starting back up on Jardiance  due to the side effects. Patient questioning if there is an alternative medication for him to take that does not have as many side effects. Callback number [phone].

## 2024-08-13 NOTE — Telephone Encounter (Signed)
 See lab results.

## 2024-08-14 ENCOUNTER — Other Ambulatory Visit: Payer: Self-pay | Admitting: Family Medicine

## 2024-08-14 DIAGNOSIS — I480 Paroxysmal atrial fibrillation: Secondary | ICD-10-CM

## 2024-08-15 ENCOUNTER — Other Ambulatory Visit: Payer: Self-pay

## 2024-08-15 MED ORDER — DAPAGLIFLOZIN PROPANEDIOL 5 MG PO TABS: 5.0000 mg | ORAL_TABLET | Freq: Every day | ORAL | 2 refills | Status: DC

## 2024-08-16 ENCOUNTER — Other Ambulatory Visit: Payer: Self-pay | Admitting: Family Medicine

## 2024-08-16 DIAGNOSIS — I5032 Chronic diastolic (congestive) heart failure: Secondary | ICD-10-CM

## 2024-09-01 ENCOUNTER — Telehealth: Payer: Self-pay

## 2024-09-01 ENCOUNTER — Ambulatory Visit: Payer: Self-pay

## 2024-09-01 NOTE — Telephone Encounter (Signed)
 Pt made aware there is a wait at this time for prescription  COVID vaccines

## 2024-09-01 NOTE — Telephone Encounter (Signed)
 Copied from CRM [id number]. Topic: Clinical - Medication Question >> Sep 01, 2024 11:25 AM Franky GRADE wrote: Reason for CRM: Patient is calling with an update on a change in medication from Jardiance  to farxiga , he states the farxiga  is helping alot better than jardiance  with less side effects.

## 2024-09-01 NOTE — Telephone Encounter (Signed)
 Copied from CRM [id number]. Topic: Clinical - Request for Lab/Test Order >> Sep 01, 2024 11:26 AM Mark Stark wrote: Reason for CRM: Patient is requesting a written order for a Covid vaccine so he can take the pharmacies where he can get the new vaccine.

## 2024-09-01 NOTE — Telephone Encounter (Signed)
 Appt scheduled

## 2024-09-01 NOTE — Telephone Encounter (Signed)
 fyi

## 2024-09-01 NOTE — Telephone Encounter (Signed)
 FYI Only or Action Required?: Action required by provider: pt request paper rx for COVID.  Patient was last seen in primary care on 08/11/2024 by Antonio Meth, [name], DO.  Called Nurse Triage reporting Foot Swelling.  Symptoms began several weeks ago.  Interventions attempted: Rest, hydration, or home remedies.  Symptoms are: unchangedno.  Triage Disposition: See PCP When Office is Open (Within 3 Days)  Patient/caregiver understands and will follow disposition?: Yes   Copied from CRM #[id number]. Topic: Clinical - Red Word Triage >> Sep 01, 2024 11:27 AM Franky GRADE wrote: Red Word that prompted transfer to Nurse Triage: Patient has been experiencing feet and ankle swelling due to in taking about 8-10 glass of water a day based on recommendation due to his kidney function. He has also experienced Low blood pressure, 101-114 with dips to 88 at times, lack of energy. Reason for Disposition  [1] MILD swelling of both ankles (i.e., pedal edema) AND [2] new-onset or getting worse  Answer Assessment - Initial Assessment Questions 1. ONSET: When did the swelling start? (e.g., minutes, hours, days)     Several weeks 2. LOCATION: What part of the leg is swollen?  Are both legs swollen or just one leg?     Bilateral feet and ankles 3. SEVERITY: How bad is the swelling? (e.g., localized; mild, moderate, severe)     moderate 4. REDNESS: Is there redness or signs of infection?     Discoloration of bilateral leg - pt stated color comes from fall from last year 5. PAIN: Is the swelling painful to touch? If Yes, ask: How painful is it?   (Scale 1-10; mild, moderate or severe)     0 6. FEVER: Do you have a fever? If Yes, ask: What is it, how was it measured, and when did it start?      no 7. CAUSE: What do you think is causing the leg swelling?     unknown 8. MEDICAL HISTORY: Do you have a history of blood clots (e.g., DVT), cancer, heart failure, kidney disease, or liver  failure?     na 9. RECURRENT SYMPTOM: Have you had leg swelling before? If Yes, ask: When was the last time? What happened that time?     yes 10. OTHER SYMPTOMS: Do you have any other symptoms? (e.g., chest pain, difficulty breathing)       SOB at times 11. PREGNANCY: Is there any chance you are pregnant? When was your last menstrual period?       Na  Pt requests prescription for COVID vaccine.  Protocols used: Leg Swelling and Edema-A-AH

## 2024-09-02 DIAGNOSIS — Z23 Encounter for immunization: Secondary | ICD-10-CM | POA: Diagnosis not present

## 2024-09-04 ENCOUNTER — Ambulatory Visit (INDEPENDENT_AMBULATORY_CARE_PROVIDER_SITE_OTHER): Admitting: Family Medicine

## 2024-09-04 ENCOUNTER — Ambulatory Visit: Payer: Self-pay | Admitting: Family Medicine

## 2024-09-04 ENCOUNTER — Ambulatory Visit (HOSPITAL_BASED_OUTPATIENT_CLINIC_OR_DEPARTMENT_OTHER)
Admission: RE | Admit: 2024-09-04 | Discharge: 2024-09-04 | Disposition: A | Discharge status: Home / Self Care | Source: Ambulatory Visit | Attending: Family Medicine | Admitting: Family Medicine

## 2024-09-04 ENCOUNTER — Other Ambulatory Visit (HOSPITAL_BASED_OUTPATIENT_CLINIC_OR_DEPARTMENT_OTHER): Payer: Self-pay

## 2024-09-04 VITALS — BP 112/70 | HR 99 | Temp 98.0°F | Resp 15 | Ht 66.0 in | Wt 156.4 lb

## 2024-09-04 DIAGNOSIS — F419 Anxiety disorder, unspecified: Secondary | ICD-10-CM

## 2024-09-04 DIAGNOSIS — I509 Heart failure, unspecified: Secondary | ICD-10-CM | POA: Diagnosis not present

## 2024-09-04 DIAGNOSIS — R600 Localized edema: Secondary | ICD-10-CM | POA: Diagnosis not present

## 2024-09-04 DIAGNOSIS — E871 Hypo-osmolality and hyponatremia: Secondary | ICD-10-CM | POA: Diagnosis not present

## 2024-09-04 DIAGNOSIS — I10 Essential (primary) hypertension: Secondary | ICD-10-CM | POA: Diagnosis not present

## 2024-09-04 DIAGNOSIS — N1832 Chronic kidney disease, stage 3b: Secondary | ICD-10-CM | POA: Diagnosis not present

## 2024-09-04 DIAGNOSIS — I5032 Chronic diastolic (congestive) heart failure: Secondary | ICD-10-CM | POA: Insufficient documentation

## 2024-09-04 DIAGNOSIS — Z8546 Personal history of malignant neoplasm of prostate: Secondary | ICD-10-CM

## 2024-09-04 DIAGNOSIS — Z23 Encounter for immunization: Secondary | ICD-10-CM

## 2024-09-04 DIAGNOSIS — R9389 Abnormal findings on diagnostic imaging of other specified body structures: Secondary | ICD-10-CM | POA: Diagnosis not present

## 2024-09-04 DIAGNOSIS — F5101 Primary insomnia: Secondary | ICD-10-CM | POA: Diagnosis not present

## 2024-09-04 DIAGNOSIS — I480 Paroxysmal atrial fibrillation: Secondary | ICD-10-CM

## 2024-09-04 DIAGNOSIS — I700 Atherosclerosis of aorta: Secondary | ICD-10-CM | POA: Diagnosis not present

## 2024-09-04 LAB — POC URINALSYSI DIPSTICK (AUTOMATED)
Bilirubin, UA: NEGATIVE
Blood, UA: NEGATIVE
Glucose, UA: POSITIVE — AB
Ketones, UA: NEGATIVE
Leukocytes, UA: NEGATIVE
Nitrite, UA: NEGATIVE
Protein, UA: NEGATIVE
Spec Grav, UA: <=1.005 — AB (ref 1.010–1.025)
Urobilinogen, UA: 0.2 U/dL
pH, UA: 7.5 (ref 5.0–8.0)

## 2024-09-04 MED ORDER — COVID-19 MRNA VAC-TRIS(PFIZER) 30 MCG/0.3ML IM SUSY
0.3000 mL | PREFILLED_SYRINGE | Freq: Once | INTRAMUSCULAR | 0 refills | Status: AC
  Filled 2024-09-04: qty 0.3, 1d supply, fill #0

## 2024-09-04 MED ORDER — CLONAZEPAM 0.5 MG PO TABS: 0.5000 mg | ORAL_TABLET | Freq: Every day | ORAL | 3 refills | Status: DC

## 2024-09-04 MED ORDER — COMIRNATY 30 MCG/0.3ML IM SUSY
0.3000 mL | PREFILLED_SYRINGE | Freq: Once | INTRAMUSCULAR | 0 refills | Status: AC
  Filled 2024-09-04: qty 0.3, 1d supply, fill #0

## 2024-09-04 MED ORDER — COVID-19 MRNA VAC-TRIS(PFIZER) 30 MCG/0.3ML IM SUSY: 0.3000 mL | PREFILLED_SYRINGE | Freq: Once | INTRAMUSCULAR | 0 refills | Status: DC

## 2024-09-04 NOTE — Telephone Encounter (Signed)
-----   Message from Jamee JONELLE Antonio Cyndee sent at 09/04/2024  4:34 PM EDT ----- Normal xray---  please call pt  ----- Message ----- From: Rebecka, Rad Results In Sent: 09/04/2024   2:27 PM EDT To: Jamee JONELLE Antonio Cyndee, DO

## 2024-09-04 NOTE — Telephone Encounter (Signed)
 Spoke to patient about normal xray results.

## 2024-09-04 NOTE — Progress Notes (Signed)
 Subjective:    Patient ID: Mark Stark., male    DOB: 01/17/1928, 89 y.o.   MRN: [id number]  Chief Complaint  Patient presents with   Foot Swelling    Bilaterl Ankles and feet swelling says he is taking a diuretic     HPI Patient is in today for foot swelling.   Discussed the use of AI scribe software for clinical note transcription with the patient, who gave verbal consent to proceed.  History of Present Illness Ermias Tomeo. is a 89 year old male who presents with recent abdominal pain and decreased energy.  He has experienced swollen ankles and legs, particularly on the left side, which have been ongoing. Despite the swelling, he is still able to wear his shoes. He reports taking his medications faithfully.  Approximately two weeks ago, around Labor Day, he experienced an 'event' characterized by vague symptoms followed by ten days of significant abdominal pain and frequent small bowel movements, up to eight times a day. This has since resolved to his normal pattern of two painless bowel movements per day. The abdominal pain was low in location with no associated vomiting or nausea. He also reports a decrease in energy since the event, stating he cannot do as much as he could in July and August.  No chest pain, vomiting, nausea, urinary problems aside from increased urination due to diuretics, and shortness of breath. He reports decreased energy and low abdominal pain.  He is currently taking Eliquis  regularly and mentions a change from Jardiance  to Farxiga , and reports that Farxiga , like Jardiance , has caused urinary tract yeast infections. He is also taking torasemide and spironolactone , with the latter at a half dose, and clonazepam  for sleep, which has improved his sleep quality.  He expresses concern about his low systolic blood pressure, which has been as low as 88, and notes his weight is stable between 144 to 148 pounds. He mentions difficulty using compression socks and does  not elevate his legs at home, as he does not have a recliner.    Past Medical History:  Diagnosis Date   Acute diastolic CHF (congestive heart failure) (HCC) 10/25/2021   Anxiety    Aortic insufficiency    Atrial fibrillation (HCC)    Chronic diastolic CHF (congestive heart failure) (HCC)    Chronic kidney disease, stage 3a (HCC)    Diverticulitis    Dyslipidemia    Hypertension    Inferior mesenteric vein thrombosis (HCC)    Mitral regurgitation    Nonrheumatic mitral valve regurgitation 10/28/2021   Paroxysmal atrial fibrillation (HCC) 01/12/2018   Persistent atrial fibrillation (HCC)    Prostate cancer [facility])     Past Surgical History:  Procedure Laterality Date   CARDIOVERSION N/A 03/01/2018   Procedure: CARDIOVERSION;  Surgeon: Burnard Debby LABOR, MD;  Location: [facility] ENDOSCOPY;  Service: Cardiovascular;  Laterality: N/A;   CARDIOVERSION N/A 08/26/2021   Procedure: CARDIOVERSION;  Surgeon: Mona Vinie BROCKS, MD;  Location: [facility] ENDOSCOPY;  Service: Cardiovascular;  Laterality: N/A;   CARDIOVERSION N/A 12/06/2021   Procedure: CARDIOVERSION;  Surgeon: Kate Lonni CROME, MD;  Location: [facility] ENDOSCOPY;  Service: Cardiovascular;  Laterality: N/A;   CATARACT EXTRACTION  01/27/2010   right   CATARACT EXTRACTION  03/04/2010   left   Prostate seed implants     PROSTATE SURGERY      Family History  Problem Relation Age of Onset   Dementia Mother    Alzheimer's disease Mother    Mental  illness Mother        ALZ DISEASE   Cancer Brother 28       pancreatic cancer    Social History   Socioeconomic History   Marital status: Widowed    Spouse name: Not on file   Number of children: Not on file   Years of education: Not on file   Highest education level: Not on file  Occupational History   Occupation: Professor---RETIRED    Employer: RETIRED    Comment: Retired  Tobacco Use   Smoking status: Never   Smokeless tobacco: Never  Vaping Use   Vaping status: Never Used  Substance  and Sexual Activity   Alcohol use: No    Comment: Little wine occasionally    Drug use: No   Sexual activity: Never    Partners: Female  Other Topics Concern   Not on file  Social History Narrative   Not on file   Social Drivers of Health   Financial Resource Strain: Low Risk  (02/21/2024)   Overall Financial Resource Strain (CARDIA)    Difficulty of Paying Living Expenses: Not hard at all  Food Insecurity: No Food Insecurity (02/21/2024)   Hunger Vital Sign    Worried About Running Out of Food in the Last Year: Never true    Ran Out of Food in the Last Year: Never true  Transportation Needs: No Transportation Needs (02/21/2024)   PRAPARE - Administrator, Civil Service (Medical): No    Lack of Transportation (Non-Medical): No  Physical Activity: Inactive (02/21/2024)   Exercise Vital Sign    Days of Exercise per Week: 0 days    Minutes of Exercise per Session: 0 min  Stress: No Stress Concern Present (02/21/2024)   [employer] of Occupational Health - Occupational Stress Questionnaire    Feeling of Stress : Only a little  Social Connections: Moderately Isolated (02/21/2024)   Social Connection and Isolation Panel    Frequency of Communication with Friends and Family: More than three times a week    Frequency of Social Gatherings with Friends and Family: Three times a week    Attends Religious Services: More than 4 times per year    Active Member of Clubs or Organizations: No    Attends Banker Meetings: Never    Marital Status: Widowed  Intimate Partner Violence: Not At Risk (02/21/2024)   Humiliation, Afraid, Rape, and Kick questionnaire    Fear of Current or Ex-Partner: No    Emotionally Abused: No    Physically Abused: No    Sexually Abused: No    Outpatient Medications Prior to Visit  Medication Sig Dispense Refill   acetaminophen  (TYLENOL ) 325 MG tablet Take 2 tablets (650 mg total) by mouth every 6 (six) hours as needed for mild pain or  moderate pain (or Fever >/= 101).     apixaban  (ELIQUIS ) 5 MG TABS tablet Take 1 tablet (5 mg total) by mouth 2 (two) times daily. 180 tablet 0   b complex vitamins tablet Take 1 tablet by mouth daily.     Calcium  Carbonate-Vitamin D  600-400 MG-UNIT tablet Take 1 tablet by mouth daily.     clonazePAM  (KLONOPIN ) 0.5 MG tablet Take 1 tablet (0.5 mg total) by mouth at bedtime. 30 tablet 1   cromolyn  (NASALCROM ) 5.2 MG/ACT nasal spray Place 2 sprays into both nostrils at bedtime. 26 mL 5   dapagliflozin  propanediol (FARXIGA ) 5 MG TABS tablet Take 1 tablet (5 mg total) by  mouth daily before breakfast. 30 tablet 2   fenofibrate  160 MG tablet TAKE 1 TABLET BY MOUTH EVERY DAY 90 tablet 1   fish oil-omega-3 fatty acids  1000 MG capsule Take 1 g by mouth 2 (two) times daily.     latanoprost  (XALATAN ) 0.005 % ophthalmic solution Place 1 drop into the left eye at bedtime. 2.5 mL 0   Multiple Vitamin (THEREMS PO) Take 1 tablet by mouth daily.     Multiple Vitamins-Minerals (PRESERVISION AREDS 2) CAPS 2 po qd     nystatin  cream (MYCOSTATIN ) Apply 1 Application topically 2 (two) times daily. 30 g 0   simvastatin  (ZOCOR ) 10 MG tablet Take 1 tablet (10 mg total) by mouth at bedtime. 90 tablet 1   spironolactone  (ALDACTONE ) 25 MG tablet TAKE 1/2 TABLET BY MOUTH EVERY DAY 45 tablet 1   tamsulosin  (FLOMAX ) 0.4 MG CAPS capsule Take 1 capsule (0.4 mg total) by mouth daily. 30 capsule 0   torsemide  (DEMADEX ) 20 MG tablet Take 1 tablet (20 mg total) by mouth daily. In case of weight gain 2 to 3 lbs in 24 hr or 5 lbs in 7 days take 2 tablets daily until weight back to baseline. 90 tablet 3   vitamin C  (ASCORBIC ACID ) 500 MG tablet Take 500 mg by mouth daily.     gabapentin  (NEURONTIN ) 100 MG capsule Take 2 capsules (200 mg total) by mouth at bedtime. (Patient not taking: Reported on 08/11/2024) 180 capsule 1   No facility-administered medications prior to visit.    Allergies  Allergen Reactions   Alpha-Gal Other (See  Comments)    Avoidance of meat, dairy products, and other products that contain alpha-gal. Individuals diagnosed with AGS should completely avoid the consumption of mammalian meat, including beef, pork, lamb, venison, goat, and rabbit.    Sulfamethoxazole Other (See Comments)    sulfamethoxazole   Sulfonamide Derivatives Other (See Comments)    Unknown - patient cannot remember the reaction    Review of Systems  Constitutional:  Negative for fever and malaise/fatigue.  HENT:  Negative for congestion.   Eyes:  Negative for blurred vision.  Respiratory:  Negative for cough and shortness of breath.   Cardiovascular:  Positive for leg swelling. Negative for chest pain and palpitations.  Gastrointestinal:  Negative for vomiting.  Musculoskeletal:  Negative for back pain.  Skin:  Negative for rash.  Neurological:  Negative for loss of consciousness and headaches.       Objective:    Physical Exam Vitals and nursing note reviewed.  Constitutional:      General: He is not in acute distress.    Appearance: Normal appearance. He is well-developed.  HENT:     Head: Normocephalic and atraumatic.  Eyes:     General: No scleral icterus.       Right eye: No discharge.        Left eye: No discharge.  Cardiovascular:     Rate and Rhythm: Normal rate and regular rhythm.     Heart sounds: No murmur heard. Pulmonary:     Effort: Pulmonary effort is normal. No respiratory distress.     Breath sounds: Normal breath sounds.  Musculoskeletal:        General: Normal range of motion.     Cervical back: Normal range of motion and neck supple.     Right lower leg: No edema.     Left lower leg: No edema.  Skin:    General: Skin is warm and dry.  Neurological:     Mental Status: He is alert and oriented to person, place, and time.  Psychiatric:        Mood and Affect: Mood normal.        Behavior: Behavior normal.        Thought Content: Thought content normal.        Judgment: Judgment  normal.     BP 112/70   Pulse 99   Temp 98 F (36.7 C) (Oral)   Resp 15   Ht 5' 6 (1.676 m)   Wt 156 lb 6.4 oz (70.9 kg)   SpO2 98%   BMI 25.24 kg/m  Wt Readings from Last 3 Encounters:  09/04/24 156 lb 6.4 oz (70.9 kg)  08/11/24 156 lb 3.2 oz (70.9 kg)  07/15/24 151 lb 12.8 oz (68.9 kg)    Diabetic Foot Exam - Simple   No data filed    Lab Results  Component Value Date   WBC 7.8 07/07/2024   HGB 15.4 07/07/2024   HCT 45.8 07/07/2024   PLT 162 07/07/2024   GLUCOSE 96 08/11/2024   CHOL 111 04/07/2024   TRIG 75.0 04/07/2024   HDL 47.20 04/07/2024   LDLCALC 49 04/07/2024   ALT 17 08/11/2024   AST 27 08/11/2024   NA 133 (L) 08/11/2024   K 5.1 08/11/2024   CL 95 (L) 08/11/2024   CREATININE 1.50 08/11/2024   BUN 29 (H) 08/11/2024   CO2 29 08/11/2024   TSH 1.498 05/25/2023   PSA 0.35 12/27/2009   INR 1.2 07/07/2024   HGBA1C 5.0 05/21/2012    Lab Results  Component Value Date   TSH 1.498 05/25/2023   Lab Results  Component Value Date   WBC 7.8 07/07/2024   HGB 15.4 07/07/2024   HCT 45.8 07/07/2024   MCV 94.2 07/07/2024   PLT 162 07/07/2024   Lab Results  Component Value Date   NA 133 (L) 08/11/2024   K 5.1 08/11/2024   CO2 29 08/11/2024   GLUCOSE 96 08/11/2024   BUN 29 (H) 08/11/2024   CREATININE 1.50 08/11/2024   BILITOT 1.4 (H) 08/11/2024   ALKPHOS 45 08/11/2024   AST 27 08/11/2024   ALT 17 08/11/2024   PROT 6.5 08/11/2024   ALBUMIN 4.4 08/11/2024   CALCIUM  9.6 08/11/2024   ANIONGAP 12 07/07/2024   EGFR 44 (L) 05/26/2024   GFR 39.72 (L) 08/11/2024   Lab Results  Component Value Date   CHOL 111 04/07/2024   Lab Results  Component Value Date   HDL 47.20 04/07/2024   Lab Results  Component Value Date   LDLCALC 49 04/07/2024   Lab Results  Component Value Date   TRIG 75.0 04/07/2024   Lab Results  Component Value Date   CHOLHDL 2 04/07/2024   Lab Results  Component Value Date   HGBA1C 5.0 05/21/2012       Assessment &  Plan:  Chronic diastolic CHF (congestive heart failure) (HCC) -     CBC with Differential/Platelet -     Comprehensive metabolic panel with GFR -     Lipid panel -     TSH  Paroxysmal atrial fibrillation (HCC) -     EKG 12-Lead  Chronic renal impairment, stage 3b (HCC)  Anxiety  Essential hypertension -     Lipid panel -     TSH  Personal history of malignant neoplasm of prostate  Hyponatremia  Lower extremity edema -     CBC with Differential/Platelet -  Comprehensive metabolic panel with GFR -     Lipid panel -     TSH  Assessment and Plan Assessment & Plan Chronic diastolic heart failure with lower extremity edema   He experiences chronic diastolic heart failure with persistent lower extremity edema, particularly in the left leg, with a recent increase in swelling. There have been no recent changes in diet or significant weight loss. He struggles with compression stockings and does not elevate his legs regularly. Diuretics are necessary for fluid management but may contribute to hypotension. Increase spironolactone  to a whole pill for the rest of the week and weekend, then return to half a pill on Monday. Encourage leg elevation to reduce edema. Order blood work to monitor kidney function and electrolytes, an EKG to assess for any changes, and a chest x-ray to rule out underlying issues contributing to symptoms.  Paroxysmal atrial fibrillation   His paroxysmal atrial fibrillation is managed with Eliquis . He has not experienced recent episodes of chest pain or dyspnea but reports feeling more tired recently. There are no acute symptoms suggestive of atrial fibrillation exacerbation.  Chronic kidney disease, stage 3b   He has chronic kidney disease stage 3b and is on diuretics, which may affect kidney function. Blood pressure is occasionally low, possibly related to diuretic use. Order blood work to monitor kidney function and electrolytes.  Essential hypertension   He has  essential hypertension with recent episodes of low systolic blood pressure, as low as 88 mmHg. Diuretics may be contributing to hypotension. No significant weight loss is reported. Monitor blood pressure closely, especially with changes in diuretic dosage.  Insomnia   His insomnia is managed with clonazepam , which provides better sleep. There is concern about potential issues with refills due to it being a controlled substance. Melatonin was discussed as a potential over-the-counter alternative if issues with clonazepam  refills arise. Continue clonazepam  for insomnia management.    Aliayah Tyer R Lowne Chase, DO

## 2024-09-05 ENCOUNTER — Encounter: Payer: Self-pay | Admitting: Family Medicine

## 2024-09-05 LAB — LIPID PANEL
Cholesterol: 92 mg/dL (ref 0–200)
HDL: 35.3 mg/dL — ABNORMAL LOW (ref >39.00)
LDL Cholesterol: 40 mg/dL (ref 0–99)
NonHDL: 57.11
Total CHOL/HDL Ratio: 3
Triglycerides: 86 mg/dL (ref 0.0–149.0)
VLDL: 17.2 mg/dL (ref 0.0–40.0)

## 2024-09-05 LAB — CBC WITH DIFFERENTIAL/PLATELET
Basophils Absolute: 0.1 K/uL (ref 0.0–0.1)
Basophils Relative: 0.9 % (ref 0.0–3.0)
Eosinophils Absolute: 0.1 K/uL (ref 0.0–0.7)
Eosinophils Relative: 0.9 % (ref 0.0–5.0)
HCT: 42.2 % (ref 39.0–52.0)
Hemoglobin: 14.1 g/dL (ref 13.0–17.0)
Lymphocytes Relative: 8 % — ABNORMAL LOW (ref 12.0–46.0)
Lymphs Abs: 0.6 K/uL — ABNORMAL LOW (ref 0.7–4.0)
MCHC: 33.5 g/dL (ref 30.0–36.0)
MCV: 94.5 fl (ref 78.0–100.0)
Monocytes Absolute: 1 K/uL (ref 0.1–1.0)
Monocytes Relative: 14.4 % — ABNORMAL HIGH (ref 3.0–12.0)
Neutro Abs: 5.5 K/uL (ref 1.4–7.7)
Neutrophils Relative %: 75.8 % (ref 43.0–77.0)
Platelets: 179 K/uL (ref 150.0–400.0)
RBC: 4.47 Mil/uL (ref 4.22–5.81)
RDW: 13.9 % (ref 11.5–15.5)
WBC: 7.3 K/uL (ref 4.0–10.5)

## 2024-09-05 LAB — COMPREHENSIVE METABOLIC PANEL WITH GFR
ALT: 20 U/L (ref 0–53)
AST: 30 U/L (ref 0–37)
Albumin: 4.1 g/dL (ref 3.5–5.2)
Alkaline Phosphatase: 53 U/L (ref 39–117)
BUN: 27 mg/dL — ABNORMAL HIGH (ref 6–23)
CO2: 28 meq/L (ref 19–32)
Calcium: 9.4 mg/dL (ref 8.4–10.5)
Chloride: 90 meq/L — ABNORMAL LOW (ref 96–112)
Creatinine, Ser: 1.46 mg/dL (ref 0.40–1.50)
GFR: 41.01 mL/min — ABNORMAL LOW (ref >60.00)
Glucose, Bld: 89 mg/dL (ref 70–99)
Potassium: 5.1 meq/L (ref 3.5–5.1)
Sodium: 128 meq/L — ABNORMAL LOW (ref 135–145)
Total Bilirubin: 1.4 mg/dL — ABNORMAL HIGH (ref 0.2–1.2)
Total Protein: 6.5 g/dL (ref 6.0–8.3)

## 2024-09-05 LAB — TSH: TSH: 2.07 u[IU]/mL (ref 0.35–5.50)

## 2024-09-17 DIAGNOSIS — C61 Malignant neoplasm of prostate: Secondary | ICD-10-CM | POA: Diagnosis not present

## 2024-09-19 ENCOUNTER — Other Ambulatory Visit: Payer: Self-pay | Admitting: Family Medicine

## 2024-09-19 DIAGNOSIS — B372 Candidiasis of skin and nail: Secondary | ICD-10-CM

## 2024-09-22 ENCOUNTER — Encounter (HOSPITAL_BASED_OUTPATIENT_CLINIC_OR_DEPARTMENT_OTHER): Payer: Self-pay | Admitting: Cardiology

## 2024-09-22 ENCOUNTER — Ambulatory Visit (INDEPENDENT_AMBULATORY_CARE_PROVIDER_SITE_OTHER): Admitting: Cardiology

## 2024-09-22 ENCOUNTER — Telehealth: Payer: Self-pay | Admitting: Cardiology

## 2024-09-22 VITALS — BP 118/80 | HR 113 | Ht 66.0 in | Wt 156.8 lb

## 2024-09-22 DIAGNOSIS — I4821 Permanent atrial fibrillation: Secondary | ICD-10-CM

## 2024-09-22 DIAGNOSIS — I5033 Acute on chronic diastolic (congestive) heart failure: Secondary | ICD-10-CM

## 2024-09-22 DIAGNOSIS — D6869 Other thrombophilia: Secondary | ICD-10-CM

## 2024-09-22 DIAGNOSIS — I340 Nonrheumatic mitral (valve) insufficiency: Secondary | ICD-10-CM | POA: Diagnosis not present

## 2024-09-22 DIAGNOSIS — Z7901 Long term (current) use of anticoagulants: Secondary | ICD-10-CM

## 2024-09-22 DIAGNOSIS — R0602 Shortness of breath: Secondary | ICD-10-CM

## 2024-09-22 DIAGNOSIS — R600 Localized edema: Secondary | ICD-10-CM

## 2024-09-22 NOTE — Patient Instructions (Addendum)
 medication Instructions:   Your physician recommends that you continue on your current medications as directed. Please refer to the Current Medication list given to you today.   *If you need a refill on your cardiac medications before your next appointment, please call your pharmacy*  Lab Work:  None ordered.  If you have labs (blood work) drawn today and your tests are completely normal, you will receive your results only by: MyChart Message (if you have MyChart) OR A paper copy in the mail If you have any lab test that is abnormal or we need to change your treatment, we will call you to review the results.  Testing/Procedures:  None ordered.  Follow-Up: At [facility], you and your health needs are our priority.  As part of our continuing mission to provide you with exceptional heart care, our providers are all part of one team.  This team includes your primary Cardiologist (physician) and Advanced Practice Providers or APPs (Physician Assistants and Nurse Practitioners) who all work together to provide you with the care you need, when you need it.  Your next appointment:   3 month(s)  Provider:   Shelda Bruckner, MD    We recommend signing up for the patient portal called MyChart.  Sign up information is provided on this After Visit Summary.  MyChart is used to connect with patients for Virtual Visits (Telemedicine).  Patients are able to view lab/test results, encounter notes, upcoming appointments, etc.  Non-urgent messages can be sent to your provider as well.   To learn more about what you can do with MyChart, go to [email or web address].   Other Instructions       Go back to taking the full dose of torsemide  every day. This should gradually bring your weight back to your normal range. If your weight isn't back under 148 lbs within 3 days (by Friday, assuming Tuesday, Wednesday, Thursday you took the full pill), take an extra dose of torsemide  (one in  the morning and one in the early afternoon, at least 4 hours between doses) until your weight is back under 148 lbs, then return to once daily torsemide  (full tablet). If you have taken the torsemide  twice a day for a total of 5 days, and your weight is not under 148 lbs yet, please call us .

## 2024-09-22 NOTE — Telephone Encounter (Signed)
 Pt c/o Shortness Of Breath: STAT if SOB developed within the last 24 hours or pt is noticeably SOB on the phone  1. Are you currently SOB (can you hear that pt is SOB on the phone)? no  2. How long have you been experiencing SOB? For about two weeks.   3. Are you SOB when sitting or when up moving around? Mostly when he moves around, and it comes and goes. With activity it is worse.   4. Are you currently experiencing any other symptoms? Decline in energy, a little dizzy on his feet.

## 2024-09-22 NOTE — Telephone Encounter (Signed)
 Spoke with C Pleasants regarding patient.  She has new patient appointment today and will to give up so patient can be seen Patient complaining of shortness of breath, dizzy at times, and decreased energy and feels it is his heart Scheduled appointment today at 2:20 pm, ok per Dr Christopher  Advised [place]

## 2024-09-22 NOTE — Progress Notes (Signed)
 Cardiology Office Note:  .    Date:  09/22/2024  ID:  Deward FORBES Verma Mickey., DOB [birth year], MRN [id number] PCP: Antonio Meth, [name], DO  [facility] Providers Cardiologist:  Shelda Bruckner, MD     History of Present Illness: .    Yacqub Baston. is a 89 y.o. male with a hx of permanent atrial fibrillation, chronic diastolic CHF, CKD stage 3b, dyslipidemia, mitral regurgitation, prostate cancer, and anxiety, who is seen for follow up.    Cardiovascular history: On presentation to the hospital 10/25/2021 he complained of worsening shortness of breath, LE edema, and orthopnea. CXR revealed small bilateral effusions and mild interstitial edema. He was admitted for congestive heart failure. We have struggled with balance of blood pressure, afib, and fluid balance.  Today: Seen as a same day visit after family friend (on [place]) called regarding patient. She noted that patient has been having shortness of breath, dizziness, and decreased energy for the last two weeks.  Has noticed worsening LE edema for the last few days (on review, appears this has actually been several weeks). Has also noticed dry cough. No fevers/chills. Weights have been steady within 144-147 lbs except for the last few days, up to 151 lbs yesterday. HR has been largely steady, with ranges 57-114, most 60s-70s at rest. Has felt more short of breath on exertion as well.   On history review, he notes that recently, when urinating, he also had to have a small bowel movement, up to 8 in a day. Cut back to 1/2 tablet of torsemide  as he thought this was causing the increased bowel movements, but this also was when he started to retain fluid. Decreasing the torsemide  did not change the bowel movement frequency. His stools are now back to normal. He is now taking 1/2 tab of torsemide  daily, notes there were several days when he took no torsemide .  We discussed plan for diuretics (see summary in AVS and below). We discussed balance  between edema and avoiding dehydration/worsening kidney function. He has appt with urology next week as well. This is for rising PSA (has history of remote prostate cancer).   Reviewed his recent labs, noted sodium was low, unclear if this was hyper or hypovolemic hyponatremia (ie from diuretic use vs. From volume overload).  We reviewed this and his chronic kidney disease stage 3b, monitoring labs. Has follow up scheduled with her 10/14.   ROS:  Please see the history of present illness. ROS otherwise negative except as noted.   Studies Reviewed: SABRA    EKG Interpretation Date/Time:  Monday September 22 2024 14:49:35 EDT Ventricular Rate:  113 PR Interval:    QRS Duration:  70 QT Interval:  268 QTC Calculation: 367 R Axis:   20  Text Interpretation: Atrial flutter with variable A-V block Confirmed by Bruckner Shelda [phone]) on 09/22/2024 3:08:23 PM     Physical Exam:    VS:  BP 118/80   Pulse (!) 113   Ht 5' 6 (1.676 m)   Wt 156 lb 12.8 oz (71.1 kg)   SpO2 98%   BMI 25.31 kg/m    Wt Readings from Last 3 Encounters:  09/22/24 156 lb 12.8 oz (71.1 kg)  09/04/24 156 lb 6.4 oz (70.9 kg)  08/11/24 156 lb 3.2 oz (70.9 kg)    GEN: Well nourished, well developed in no acute distress HEENT: Normal, moist mucous membranes NECK: JVD to low neck at 90 degrees CARDIAC: irregularly irregular rhythm, normal S1  and S2, no rubs or gallops. 1/6 systolic murmur. VASCULAR: Radial and DP pulses 2+ bilaterally. No carotid bruits RESPIRATORY:  Clear to auscultation without wheezing or rhonchi. Bilateral mild rales ABDOMEN: Soft, non-tender, non-distended MUSCULOSKELETAL:  Ambulates independently SKIN: Warm and dry, bilateral 2+ LE edema NEUROLOGIC:  Alert and oriented x 3. No focal neuro deficits noted. PSYCHIATRIC:  Normal affect    ASSESSMENT AND PLAN: .    Acute on chronic diastolic heart failure 2/2 diuretic changes LE edema, dyspnea on exertion Moderate to severe mitral  regurgitation when volume overloaded Chronic kidney disease, stage 3b -NYHA class III today, volume up on exam -suspect this is 2/2 diuretic changes (see HPI). Given the following instructions:  -Go back to taking the full dose of torsemide  every day. This should gradually bring your weight back to your normal range. If your weight isn't back under 148 lbs within 3 days (by Friday, assuming Tuesday, Wednesday, Thursday you took the full pill), take an extra dose of torsemide  (one in the morning and one in the early afternoon, at least 4 hours between doses) until your weight is back under 148 lbs, then return to once daily torsemide  (full tablet). If you have taken the torsemide  twice a day for a total of 5 days, and your weight is not under 148 lbs yet, please call us .   -has hyponatremia on recent labs, but BUN/Cr also lower than recent, this was when he was taking less diuretic. Suspect this is hypervolemic hyponatremia and will improve with returning to his diuretic regimen. He has follow up 10/14 with Dr. Antonio Meth and believes they discussed re-checking labs at that visit. -echo with normal EF, moderate to severe MR at peak congestion, but murmur much quieter when euvolemic -continue metoprolol  succinate 12.5 mg daily, avoid bradycardia but also avoid tachycardia with afib, Heart rates at home much lower typically than in office -now tolerating spironolactone . No BP room for ARB/ARNI. If BP drops to symptomatic lows in the future, would cut back/stop spironolactone  -not on SGLT2i due to UTI -if symptoms become difficult to manage, would consider TEE to determine if mitraclip would be beneficial. He prefers to avoid procedures based on prior conversations, but if otherwise we cannot manage his symptoms, would re-discuss   Atrial fibrillation, permanent Atypical atrial flutter -CHA2DS2/VAS Stroke Risk Points=4  -continue apixaban  5 mg BID. Meets age criteria, monitor renal function. Weight is  >60 kg -did not hold rhythm with cardioversion or amiodarone  followed by cardioversion. Asymptomatic. No further plans for rhythm control -has been a balance between optimizing heart rate and preventing hypotension. Discussed options, we will continue current dose of metoprolol     Hyperlipidemia -continue simvastatin , reasonable for age -on fenofibrate  as well   Dispo: Keep appt with me 12/3. Has appt with urology next week and PCP on 10/14 as well.   Total time of encounter: I spent 41 minutes dedicated to the care of this patient on the date of this encounter to include pre-visit review of records, face-to-face time with the patient discussing conditions above, and clinical documentation with the electronic health record. We specifically spent time today discussing history, pattern of symptoms, likely etiology, plan for management, signs/symptoms to watch for   Signed, Shelda Bruckner, MD

## 2024-09-26 ENCOUNTER — Telehealth: Payer: Self-pay | Admitting: Cardiology

## 2024-09-26 NOTE — Telephone Encounter (Signed)
 Spoke with patient and he wanted to updated on Torsemide  20 mg daily   Monday 150.4 lb Today 146.4 lb   Has only had to take 1 Torsemide  daily  Ankles and legs are still swollen, about the same and still has dry cough  Thinks breathing better except cough  Urinating more frequently   Advised patient to continue daily for another week and call back with update If gains 2 to 3 lbs in 24 hours ok to take an extra Torsemide  Call sooner if any worsening symptoms   Will forward to Dr Lonni for review

## 2024-09-26 NOTE — Telephone Encounter (Signed)
 Discussed with Dr Lonni who agreed with plan

## 2024-09-26 NOTE — Telephone Encounter (Signed)
 Pt called in asking to speak with a nurse about his last appt and to give his updates on weight and swelling.

## 2024-09-29 ENCOUNTER — Other Ambulatory Visit: Payer: Self-pay

## 2024-09-29 ENCOUNTER — Emergency Department (HOSPITAL_COMMUNITY)

## 2024-09-29 ENCOUNTER — Encounter (HOSPITAL_COMMUNITY): Payer: Self-pay | Admitting: *Deleted

## 2024-09-29 ENCOUNTER — Inpatient Hospital Stay (HOSPITAL_COMMUNITY)
Admission: EM | Admit: 2024-09-29 | Discharge: 2024-10-25 | DRG: 871 | Disposition: E | Attending: Internal Medicine | Admitting: Internal Medicine

## 2024-09-29 DIAGNOSIS — E871 Hypo-osmolality and hyponatremia: Secondary | ICD-10-CM | POA: Diagnosis not present

## 2024-09-29 DIAGNOSIS — Z800 Family history of malignant neoplasm of digestive organs: Secondary | ICD-10-CM

## 2024-09-29 DIAGNOSIS — E872 Acidosis, unspecified: Secondary | ICD-10-CM | POA: Diagnosis present

## 2024-09-29 DIAGNOSIS — R339 Retention of urine, unspecified: Secondary | ICD-10-CM | POA: Diagnosis not present

## 2024-09-29 DIAGNOSIS — I080 Rheumatic disorders of both mitral and aortic valves: Secondary | ICD-10-CM | POA: Diagnosis present

## 2024-09-29 DIAGNOSIS — L03115 Cellulitis of right lower limb: Secondary | ICD-10-CM | POA: Diagnosis present

## 2024-09-29 DIAGNOSIS — R6520 Severe sepsis without septic shock: Secondary | ICD-10-CM | POA: Diagnosis present

## 2024-09-29 DIAGNOSIS — D649 Anemia, unspecified: Secondary | ICD-10-CM | POA: Diagnosis present

## 2024-09-29 DIAGNOSIS — E785 Hyperlipidemia, unspecified: Secondary | ICD-10-CM | POA: Diagnosis present

## 2024-09-29 DIAGNOSIS — E860 Dehydration: Secondary | ICD-10-CM | POA: Diagnosis present

## 2024-09-29 DIAGNOSIS — R443 Hallucinations, unspecified: Secondary | ICD-10-CM | POA: Diagnosis not present

## 2024-09-29 DIAGNOSIS — L0390 Cellulitis, unspecified: Secondary | ICD-10-CM | POA: Diagnosis not present

## 2024-09-29 DIAGNOSIS — I130 Hypertensive heart and chronic kidney disease with heart failure and stage 1 through stage 4 chronic kidney disease, or unspecified chronic kidney disease: Secondary | ICD-10-CM | POA: Diagnosis present

## 2024-09-29 DIAGNOSIS — A419 Sepsis, unspecified organism: Secondary | ICD-10-CM | POA: Diagnosis not present

## 2024-09-29 DIAGNOSIS — Z66 Do not resuscitate: Secondary | ICD-10-CM | POA: Diagnosis not present

## 2024-09-29 DIAGNOSIS — S80821A Blister (nonthermal), right lower leg, initial encounter: Secondary | ICD-10-CM | POA: Diagnosis present

## 2024-09-29 DIAGNOSIS — I10 Essential (primary) hypertension: Secondary | ICD-10-CM | POA: Diagnosis present

## 2024-09-29 DIAGNOSIS — R000 Tachycardia, unspecified: Secondary | ICD-10-CM | POA: Diagnosis not present

## 2024-09-29 DIAGNOSIS — Z882 Allergy status to sulfonamides status: Secondary | ICD-10-CM

## 2024-09-29 DIAGNOSIS — X58XXXA Exposure to other specified factors, initial encounter: Secondary | ICD-10-CM | POA: Diagnosis present

## 2024-09-29 DIAGNOSIS — Z7901 Long term (current) use of anticoagulants: Secondary | ICD-10-CM

## 2024-09-29 DIAGNOSIS — Z79899 Other long term (current) drug therapy: Secondary | ICD-10-CM

## 2024-09-29 DIAGNOSIS — N179 Acute kidney failure, unspecified: Secondary | ICD-10-CM | POA: Diagnosis present

## 2024-09-29 DIAGNOSIS — N1832 Chronic kidney disease, stage 3b: Secondary | ICD-10-CM | POA: Diagnosis present

## 2024-09-29 DIAGNOSIS — I480 Paroxysmal atrial fibrillation: Secondary | ICD-10-CM | POA: Diagnosis not present

## 2024-09-29 DIAGNOSIS — Z1152 Encounter for screening for COVID-19: Secondary | ICD-10-CM

## 2024-09-29 DIAGNOSIS — Z91014 Allergy to mammalian meats: Secondary | ICD-10-CM

## 2024-09-29 DIAGNOSIS — Z820 Family history of epilepsy and other diseases of the nervous system: Secondary | ICD-10-CM | POA: Diagnosis not present

## 2024-09-29 DIAGNOSIS — I700 Atherosclerosis of aorta: Secondary | ICD-10-CM | POA: Diagnosis not present

## 2024-09-29 DIAGNOSIS — Z818 Family history of other mental and behavioral disorders: Secondary | ICD-10-CM | POA: Diagnosis not present

## 2024-09-29 DIAGNOSIS — R001 Bradycardia, unspecified: Secondary | ICD-10-CM | POA: Diagnosis not present

## 2024-09-29 DIAGNOSIS — Z87440 Personal history of urinary (tract) infections: Secondary | ICD-10-CM

## 2024-09-29 DIAGNOSIS — I4821 Permanent atrial fibrillation: Secondary | ICD-10-CM | POA: Diagnosis present

## 2024-09-29 DIAGNOSIS — Z8546 Personal history of malignant neoplasm of prostate: Secondary | ICD-10-CM | POA: Diagnosis not present

## 2024-09-29 DIAGNOSIS — R609 Edema, unspecified: Secondary | ICD-10-CM | POA: Diagnosis not present

## 2024-09-29 DIAGNOSIS — F419 Anxiety disorder, unspecified: Secondary | ICD-10-CM | POA: Diagnosis present

## 2024-09-29 DIAGNOSIS — I5033 Acute on chronic diastolic (congestive) heart failure: Secondary | ICD-10-CM | POA: Diagnosis present

## 2024-09-29 DIAGNOSIS — T8383XA Hemorrhage of genitourinary prosthetic devices, implants and grafts, initial encounter: Secondary | ICD-10-CM | POA: Diagnosis not present

## 2024-09-29 DIAGNOSIS — Y846 Urinary catheterization as the cause of abnormal reaction of the patient, or of later complication, without mention of misadventure at the time of the procedure: Secondary | ICD-10-CM | POA: Diagnosis not present

## 2024-09-29 DIAGNOSIS — R9389 Abnormal findings on diagnostic imaging of other specified body structures: Secondary | ICD-10-CM | POA: Diagnosis not present

## 2024-09-29 DIAGNOSIS — R238 Other skin changes: Secondary | ICD-10-CM | POA: Diagnosis present

## 2024-09-29 DIAGNOSIS — I469 Cardiac arrest, cause unspecified: Secondary | ICD-10-CM | POA: Diagnosis not present

## 2024-09-29 DIAGNOSIS — R509 Fever, unspecified: Secondary | ICD-10-CM | POA: Diagnosis not present

## 2024-09-29 LAB — RESP PANEL BY RT-PCR (RSV, FLU A&B, COVID)  RVPGX2
Influenza A by PCR: NEGATIVE
Influenza B by PCR: NEGATIVE
Resp Syncytial Virus by PCR: NEGATIVE
SARS Coronavirus 2 by RT PCR: NEGATIVE

## 2024-09-29 LAB — MAGNESIUM: Magnesium: 2.1 mg/dL (ref 1.7–2.4)

## 2024-09-29 LAB — BASIC METABOLIC PANEL WITH GFR
Anion gap: 10 (ref 5–15)
BUN: 33 mg/dL — ABNORMAL HIGH (ref 8–23)
CO2: 22 mmol/L (ref 22–32)
Calcium: 8.1 mg/dL — ABNORMAL LOW (ref 8.9–10.3)
Chloride: 88 mmol/L — ABNORMAL LOW (ref 98–111)
Creatinine, Ser: 1.4 mg/dL — ABNORMAL HIGH (ref 0.61–1.24)
GFR, Estimated: 47 mL/min — ABNORMAL LOW (ref >60)
Glucose, Bld: 122 mg/dL — ABNORMAL HIGH (ref 70–99)
Potassium: 4.6 mmol/L (ref 3.5–5.1)
Sodium: 120 mmol/L — ABNORMAL LOW (ref 135–145)

## 2024-09-29 LAB — URINALYSIS, W/ REFLEX TO CULTURE (INFECTION SUSPECTED)
Bacteria, UA: NONE SEEN
Bilirubin Urine: NEGATIVE
Glucose, UA: NEGATIVE mg/dL
Hgb urine dipstick: NEGATIVE
Ketones, ur: NEGATIVE mg/dL
Leukocytes,Ua: NEGATIVE
Nitrite: NEGATIVE
Protein, ur: NEGATIVE mg/dL
Specific Gravity, Urine: 1.011 (ref 1.005–1.030)
pH: 6 (ref 5.0–8.0)

## 2024-09-29 LAB — CBC WITH DIFFERENTIAL/PLATELET
Abs Immature Granulocytes: 0.14 K/uL — ABNORMAL HIGH (ref 0.00–0.07)
Basophils Absolute: 0 K/uL (ref 0.0–0.1)
Basophils Relative: 0 %
Eosinophils Absolute: 0 K/uL (ref 0.0–0.5)
Eosinophils Relative: 0 %
HCT: 41.1 % (ref 39.0–52.0)
Hemoglobin: 13.4 g/dL (ref 13.0–17.0)
Immature Granulocytes: 1 %
Lymphocytes Relative: 5 %
Lymphs Abs: 0.6 K/uL — ABNORMAL LOW (ref 0.7–4.0)
MCH: 28.9 pg (ref 26.0–34.0)
MCHC: 32.6 g/dL (ref 30.0–36.0)
MCV: 88.8 fL (ref 80.0–100.0)
Monocytes Absolute: 1.1 K/uL — ABNORMAL HIGH (ref 0.1–1.0)
Monocytes Relative: 10 %
Neutro Abs: 9.5 K/uL — ABNORMAL HIGH (ref 1.7–7.7)
Neutrophils Relative %: 84 %
Platelets: 209 K/uL (ref 150–400)
RBC: 4.63 MIL/uL (ref 4.22–5.81)
RDW: 14 % (ref 11.5–15.5)
WBC: 11.4 K/uL — ABNORMAL HIGH (ref 4.0–10.5)
nRBC: 0 % (ref 0.0–0.2)

## 2024-09-29 LAB — COMPREHENSIVE METABOLIC PANEL WITH GFR
ALT: 29 U/L (ref 0–44)
AST: 42 U/L — ABNORMAL HIGH (ref 15–41)
Albumin: 3.4 g/dL — ABNORMAL LOW (ref 3.5–5.0)
Alkaline Phosphatase: 92 U/L (ref 38–126)
Anion gap: 12 (ref 5–15)
BUN: 36 mg/dL — ABNORMAL HIGH (ref 8–23)
CO2: 22 mmol/L (ref 22–32)
Calcium: 8.9 mg/dL (ref 8.9–10.3)
Chloride: 85 mmol/L — ABNORMAL LOW (ref 98–111)
Creatinine, Ser: 1.57 mg/dL — ABNORMAL HIGH (ref 0.61–1.24)
GFR, Estimated: 41 mL/min — ABNORMAL LOW (ref >60)
Glucose, Bld: 108 mg/dL — ABNORMAL HIGH (ref 70–99)
Potassium: 4.6 mmol/L (ref 3.5–5.1)
Sodium: 119 mmol/L — CL (ref 135–145)
Total Bilirubin: 1 mg/dL (ref 0.0–1.2)
Total Protein: 6.3 g/dL — ABNORMAL LOW (ref 6.5–8.1)

## 2024-09-29 LAB — TSH: TSH: 1.52 u[IU]/mL (ref 0.350–4.500)

## 2024-09-29 LAB — I-STAT CG4 LACTIC ACID, ED
Lactic Acid, Venous: 2.1 mmol/L (ref 0.5–1.9)
Lactic Acid, Venous: 1.4 mmol/L (ref 0.5–1.9)

## 2024-09-29 LAB — PROTIME-INR
INR: 1.6 — ABNORMAL HIGH (ref 0.8–1.2)
Prothrombin Time: 19.7 s — ABNORMAL HIGH (ref 11.4–15.2)

## 2024-09-29 LAB — MRSA NEXT GEN BY PCR, NASAL: MRSA by PCR Next Gen: DETECTED — AB

## 2024-09-29 LAB — PRO BRAIN NATRIURETIC PEPTIDE: Pro Brain Natriuretic Peptide: 7276 pg/mL — ABNORMAL HIGH (ref <300.0)

## 2024-09-29 LAB — SODIUM, URINE, RANDOM: Sodium, Ur: <30 mmol/L

## 2024-09-29 LAB — PHOSPHORUS: Phosphorus: 3 mg/dL (ref 2.5–4.6)

## 2024-09-29 MED ORDER — METRONIDAZOLE 500 MG/100ML IV SOLN
500.0000 mg | Freq: Once | INTRAVENOUS | Status: AC
  Administered 2024-09-29: 500 mg via INTRAVENOUS
  Filled 2024-09-29: qty 100

## 2024-09-29 MED ORDER — VITAMIN C 500 MG PO TABS
500.0000 mg | ORAL_TABLET | Freq: Every day | ORAL | Status: DC
  Administered 2024-09-30 – 2024-10-01 (×2): 500 mg via ORAL
  Filled 2024-09-29 (×2): qty 1

## 2024-09-29 MED ORDER — CHLORHEXIDINE GLUCONATE CLOTH 2 % EX PADS
6.0000 | MEDICATED_PAD | Freq: Every day | CUTANEOUS | Status: DC
  Administered 2024-09-29 – 2024-10-01 (×3): 6 via TOPICAL

## 2024-09-29 MED ORDER — LATANOPROST 0.005 % OP SOLN
1.0000 [drp] | Freq: Every day | OPHTHALMIC | Status: DC
  Administered 2024-09-29 – 2024-10-01 (×3): 1 [drp] via OPHTHALMIC
  Filled 2024-09-29: qty 2.5

## 2024-09-29 MED ORDER — ACETAMINOPHEN 325 MG PO TABS
650.0000 mg | ORAL_TABLET | Freq: Four times a day (QID) | ORAL | Status: DC | PRN
  Administered 2024-09-30: 650 mg via ORAL
  Filled 2024-09-29 (×2): qty 2

## 2024-09-29 MED ORDER — HYDROMORPHONE HCL 1 MG/ML IJ SOLN
0.5000 mg | INTRAMUSCULAR | Status: DC | PRN
  Administered 2024-10-01 – 2024-10-02 (×3): 0.5 mg via INTRAVENOUS
  Filled 2024-09-29 (×3): qty 1

## 2024-09-29 MED ORDER — ACETAMINOPHEN 650 MG RE SUPP: 650.0000 mg | Freq: Four times a day (QID) | RECTAL | Status: DC | PRN

## 2024-09-29 MED ORDER — SODIUM CHLORIDE 0.9 % IV SOLN
2.0000 g | Freq: Every day | INTRAVENOUS | Status: DC
  Administered 2024-09-30 – 2024-10-01 (×2): 2 g via INTRAVENOUS
  Filled 2024-09-29 (×2): qty 12.5

## 2024-09-29 MED ORDER — LACTATED RINGERS IV SOLN: INTRAVENOUS | Status: DC

## 2024-09-29 MED ORDER — ONDANSETRON HCL 4 MG PO TABS: 4.0000 mg | ORAL_TABLET | Freq: Four times a day (QID) | ORAL | Status: DC | PRN

## 2024-09-29 MED ORDER — SIMVASTATIN 10 MG PO TABS
10.0000 mg | ORAL_TABLET | Freq: Every day | ORAL | Status: DC
Start: 2024-09-29 — End: 2024-10-02
  Administered 2024-09-29 – 2024-10-01 (×3): 10 mg via ORAL
  Filled 2024-09-29 (×3): qty 1

## 2024-09-29 MED ORDER — VANCOMYCIN HCL 1500 MG/300ML IV SOLN
1500.0000 mg | Freq: Once | INTRAVENOUS | Status: AC
  Administered 2024-09-29: 1500 mg via INTRAVENOUS
  Filled 2024-09-29: qty 300

## 2024-09-29 MED ORDER — VANCOMYCIN HCL IN DEXTROSE 1-5 GM/200ML-% IV SOLN: 1000.0000 mg | Freq: Once | INTRAVENOUS | Status: DC

## 2024-09-29 MED ORDER — LACTATED RINGERS IV BOLUS (SEPSIS)
500.0000 mL | Freq: Once | INTRAVENOUS | Status: AC
  Administered 2024-09-29: 500 mL via INTRAVENOUS

## 2024-09-29 MED ORDER — SODIUM CHLORIDE 0.9 % IV SOLN
2.0000 g | Freq: Once | INTRAVENOUS | Status: AC
  Administered 2024-09-29: 2 g via INTRAVENOUS
  Filled 2024-09-29: qty 12.5

## 2024-09-29 MED ORDER — OMEGA-3-ACID ETHYL ESTERS 1 G PO CAPS
1.0000 g | ORAL_CAPSULE | Freq: Two times a day (BID) | ORAL | Status: DC
  Administered 2024-09-29 – 2024-10-01 (×5): 1 g via ORAL
  Filled 2024-09-29 (×6): qty 1

## 2024-09-29 MED ORDER — APIXABAN 2.5 MG PO TABS
2.5000 mg | ORAL_TABLET | Freq: Two times a day (BID) | ORAL | Status: DC
  Administered 2024-09-29 – 2024-10-01 (×5): 2.5 mg via ORAL
  Filled 2024-09-29 (×5): qty 1

## 2024-09-29 MED ORDER — ORAL CARE MOUTH RINSE: 15.0000 mL | OROMUCOSAL | Status: DC | PRN

## 2024-09-29 MED ORDER — VANCOMYCIN HCL 1250 MG/250ML IV SOLN
1250.0000 mg | INTRAVENOUS | Status: DC
  Administered 2024-09-30: 1250 mg via INTRAVENOUS
  Filled 2024-09-29: qty 250

## 2024-09-29 MED ORDER — ONDANSETRON HCL 4 MG/2ML IJ SOLN: 4.0000 mg | Freq: Four times a day (QID) | INTRAMUSCULAR | Status: DC | PRN

## 2024-09-29 MED ORDER — CLONAZEPAM 0.5 MG PO TABS
0.5000 mg | ORAL_TABLET | Freq: Every day | ORAL | Status: DC
  Administered 2024-09-29 – 2024-10-01 (×3): 0.5 mg via ORAL
  Filled 2024-09-29 (×3): qty 1

## 2024-09-29 NOTE — Plan of Care (Signed)
   Problem: Education: Goal: Knowledge of General Education information will improve Description: Including pain rating scale, medication(s)/side effects and non-pharmacologic comfort measures Outcome: Progressing   Problem: Health Behavior/Discharge Planning: Goal: Ability to manage health-related needs will improve Outcome: Progressing   Problem: Activity: Goal: Risk for activity intolerance will decrease Outcome: Progressing   Problem: Coping: Goal: Level of anxiety will decrease Outcome: Progressing   Problem: Pain Managment: Goal: General experience of comfort will improve and/or be controlled Outcome: Progressing

## 2024-09-29 NOTE — ED Notes (Signed)
ED PA at BS 

## 2024-09-29 NOTE — Telephone Encounter (Signed)
  Pt c/o swelling: STAT is pt has developed SOB within 24 hours  How much weight have you gained and in what time span? 6lbs  If swelling, where is the swelling located? legs  Are you currently taking a fluid pill? yes  Are you currently SOB? yes  Do you have a log of your daily weights (if so, list)?    Have you gained 3 pounds in a day or 5 pounds in a week? yes  Have you traveled recently? No  His bp 95/74 and legs are weeping. Please advise

## 2024-09-29 NOTE — Consult Note (Signed)
 WOC Nurse Consult Note: Reason for Consult: draining blister R lower leg  Wound type: bulla R calf that has ruptured  Pressure Injury POA: NA  Measurement: see nursing flowsheet  Wound bed: small amount of red moist with purple discoloration underneath ruptured bulla  Drainage (amount, consistency, odor) serous  Periwound: erythema and edema (being treated for cellulitis)  Dressing procedure/placement/frequency: Cleanse R lower leg ruptured blister with NS, apply Xeroform gauze (Lawson [phone]) to wound bed daily, cover with ABD pad and secure with Kerlix roll gauze beginning right above toes and ending right below knee.    POC discussed with bedside nurse. WOC team will not follow. Re-consult if further needs arise.   Thank you,    Powell Bar MSN, RN-BC, [employer]

## 2024-09-29 NOTE — ED Notes (Signed)
Admitting MD at BS. Family at BS. 

## 2024-09-29 NOTE — Sepsis Progress Note (Signed)
 Elink monitoring for the code sepsis protocol.

## 2024-09-29 NOTE — ED Triage Notes (Signed)
 BIB [facility] from home for R leg redness, swelling and pain. Sx c/w cellulitis. Pt met criteria for sepsis PTA. Sx onset Friday. Rates pain 5/10. Pt reports sx preceded by chills, and shaking on Friday. HR 120-140 afib, with h/o same. CBG 151. RR 24, 98% RA. 110/76. ETCO2 30. NSL L AC. LR 500cc IVF given. Alert, NAD, calm, interactive. A&)x4.

## 2024-09-29 NOTE — Sepsis Progress Note (Signed)
 Notified bedside nurse of need to draw repeat lactic acid.

## 2024-09-29 NOTE — ED Notes (Signed)
 Admitting MD at [facility] speaking with pt and family. No changes. VSS. ABT complete. IVF infusing.

## 2024-09-29 NOTE — Telephone Encounter (Signed)
 Someone called in stating to please tell Dr. Lonni pt is on the way to the hospital because no one called her back. I was not able to verify who was calling because she disconnected before I could ask.

## 2024-09-29 NOTE — ED Notes (Signed)
 EDP at [facility]. Xray at [facility] complete. Family into room, at [facility]. No changes. IVF and ABT infusing. VSS.

## 2024-09-29 NOTE — Progress Notes (Signed)
 Pharmacy Antibiotic Note  Mark Stark. is a 89 y.o. male admitted on 09/29/2024 with cellulitis.  Pharmacy has been consulted for Vancomycin and Cefepime dosing.  Plan: Vancomycin 1500 mg IV now, then 1250 mg IV q48 hr (est AUC 469 based on SCr 1.57; Vd 0.72) Measure vancomycin AUC at steady state as indicated SCr q48 while on vanc Cefepime 2 g IV q24 hr - further dose adjustments per standing pharmacy protocol   Height: 5' 6 (167.6 cm) Weight: 70.8 kg (156 lb) IBW/kg (Calculated) : 63.8  Temp (24hrs), Avg:97.6 F (36.4 C), Min:97.6 F (36.4 C), Max:97.6 F (36.4 C)  Recent Labs  Lab 09/29/24 1317 09/29/24 1332 09/29/24 1550  WBC 11.4*  --   --   CREATININE 1.57*  --   --   LATICACIDVEN  --  2.1* 1.4    Estimated Creatinine Clearance: 26 mL/min (A) (by C-G formula based on SCr of 1.57 mg/dL (H)).    Allergies  Allergen Reactions   Alpha-Gal Other (See Comments)    Avoidance of meat, dairy products, and other products that contain alpha-gal. Individuals diagnosed with AGS should completely avoid the consumption of mammalian meat, including beef, pork, lamb, venison, goat, and rabbit.    Sulfamethoxazole    Sulfonamide Derivatives Other (See Comments)    Unknown - patient cannot remember the reaction    Antimicrobials this admission: 10/6 vancomycin >>  10/6 cefepime >>   Dose adjustments this admission: N/a  Microbiology results: 10/6 BCx: sent   Thank you for allowing pharmacy to be a part of this patient's care.  Winter Trefz A 09/29/2024 6:12 PM

## 2024-09-29 NOTE — H&P (Signed)
 History and Physical    Patient: Mark Stark. FMW:[id number] DOB: 1928/07/18 DOA: 09/29/2024 DOS: the patient was seen and examined on 09/29/2024 PCP: Antonio Cyndee Jamee JONELLE, DO   Patient coming from: Home  Chief Complaint:  Chief Complaint  Patient presents with   Leg Pain   HPI: Mark Stark. is a 89 y.o. male with medical history significant of chronic diastolic heart failure, paroxysmal atrial fibrillation (on Eliquis ), chronic kidney disease stage IIIa, hypertension, dyslipidemia, hyperlipidemia, anxiety and history of prostate cancer; who presented to the hospital secondary to right LE pain, redness and worsening swelling.  Patient reports symptom has been present approximately since 09/25/24 and worsening; initially with concerns of chills increased swelling and weight gain patient contacted his cardiology office with recommendation to increase his diuretic dosage.  Despite adjustments patient continued noticing more swelling and pain in his right lower extremity with subsequent development over the weekend of significant redness and even a blister that bursted  on its own.  Patient reported associated chills along with his symptoms.  No chest pain, palpitations, nausea, vomiting, shortness of breath, sick contacts, dysuria, hematuria, melena, hematochezia, abdominal pain, diarrhea, focal weaknesses or any other complaints.   Workup in the ED demonstrating elevated heart rate, elevated WBCs, elevated lactic acid, and worsening renal function.  Patient has met criteria for severe sepsis in the setting of right lower extremity cellulitic process.    Cultures taken, fluid resuscitation initiated and IV antibiotics started.  TRH contacted to place patient in the hospital for further evaluation and management.  Review of Systems: As mentioned in the history of present illness. All other systems reviewed and are negative. Past Medical History:  Diagnosis Date   Acute diastolic CHF  (congestive heart failure) (HCC) 10/25/2021   Anxiety    Aortic insufficiency    Atrial fibrillation (HCC)    Chronic diastolic CHF (congestive heart failure) (HCC)    Chronic kidney disease, stage 3a (HCC)    Diverticulitis    Dyslipidemia    Hypertension    Inferior mesenteric vein thrombosis    Mitral regurgitation    Nonrheumatic mitral valve regurgitation 10/28/2021   Paroxysmal atrial fibrillation (HCC) 01/12/2018   Persistent atrial fibrillation (HCC)    Prostate cancer [facility])    Past Surgical History:  Procedure Laterality Date   CARDIOVERSION N/A 03/01/2018   Procedure: CARDIOVERSION;  Surgeon: Burnard Debby LABOR, MD;  Location: [facility] ENDOSCOPY;  Service: Cardiovascular;  Laterality: N/A;   CARDIOVERSION N/A 08/26/2021   Procedure: CARDIOVERSION;  Surgeon: Mona Vinie BROCKS, MD;  Location: [facility] ENDOSCOPY;  Service: Cardiovascular;  Laterality: N/A;   CARDIOVERSION N/A 12/06/2021   Procedure: CARDIOVERSION;  Surgeon: Kate Lonni CROME, MD;  Location: [facility] ENDOSCOPY;  Service: Cardiovascular;  Laterality: N/A;   CATARACT EXTRACTION  01/27/2010   right   CATARACT EXTRACTION  03/04/2010   left   Prostate seed implants     PROSTATE SURGERY     Social History:  reports that he has never smoked. He has never used smokeless tobacco. He reports that he does not drink alcohol and does not use drugs.  Allergies  Allergen Reactions   Alpha-Gal Other (See Comments)    Avoidance of meat, dairy products, and other products that contain alpha-gal. Individuals diagnosed with AGS should completely avoid the consumption of mammalian meat, including beef, pork, lamb, venison, goat, and rabbit.    Sulfamethoxazole    Sulfonamide Derivatives Other (See Comments)    Unknown - patient cannot remember  the reaction    Family History  Problem Relation Age of Onset   Dementia Mother    Alzheimer's disease Mother    Mental illness Mother        ALZ DISEASE   Cancer Brother 70       pancreatic cancer     Prior to Admission medications   Medication Sig Start Date End Date Taking? Authorizing Provider  acetaminophen  (TYLENOL ) 325 MG tablet Take 2 tablets (650 mg total) by mouth every 6 (six) hours as needed for mild pain or moderate pain (or Fever >/= 101). 05/29/23   Arrien, Elidia Sieving, MD  apixaban  (ELIQUIS ) 5 MG TABS tablet Take 1 tablet (5 mg total) by mouth 2 (two) times daily. 08/15/24   Antonio Cyndee Rockers R, DO  Calcium  Carbonate-Vitamin D  600-400 MG-UNIT tablet Take 1 tablet by mouth daily.    [provider]  clonazePAM  (KLONOPIN ) 0.5 MG tablet Take 1 tablet (0.5 mg total) by mouth at bedtime. 09/04/24 01/02/25  Lowne Chase, Yvonne R, DO  cromolyn  (NASALCROM ) 5.2 MG/ACT nasal spray Place 2 sprays into both nostrils at bedtime. 06/16/24   Antonio Cyndee Rockers R, DO  fenofibrate  160 MG tablet TAKE 1 TABLET BY MOUTH EVERY DAY 07/09/24   Antonio Cyndee, Yvonne R, DO  fish oil-omega-3 fatty acids  1000 MG capsule Take 1 g by mouth 2 (two) times daily.    [provider]  latanoprost  (XALATAN ) 0.005 % ophthalmic solution Place 1 drop into the left eye at bedtime. 07/09/23   Fargo, Amy E, NP  Multiple Vitamin (THEREMS PO) Take 1 tablet by mouth daily.    [provider]  Multiple Vitamins-Minerals (PRESERVISION AREDS 2) CAPS 2 po qd 07/05/17   Antonio Cyndee, Yvonne R, DO  nystatin  cream (MYCOSTATIN ) APPLY TO AFFECTED AREA TWICE A DAY 09/19/24   Antonio Cyndee, Rockers [name], DO  simvastatin  (ZOCOR ) 10 MG tablet Take 1 tablet (10 mg total) by mouth at bedtime. 04/07/24   Antonio Cyndee Rockers R, DO  spironolactone  (ALDACTONE ) 25 MG tablet TAKE 1/2 TABLET BY MOUTH EVERY DAY 04/10/24   Antonio Cyndee, Yvonne R, DO  tamsulosin  (FLOMAX ) 0.4 MG CAPS capsule Take 1 capsule (0.4 mg total) by mouth daily. 07/09/23   Fargo, Amy E, NP  torsemide  (DEMADEX ) 20 MG tablet Take 1 tablet (20 mg total) by mouth daily. In case of weight gain 2 to 3 lbs in 24 hr or 5 lbs in 7 days take 2 tablets daily until weight  back to baseline. Patient taking differently: Take 10 mg by mouth daily. In case of weight gain 2 to 3 lbs in 24 hr or 5 lbs in 7 days take 2 tablets daily until weight back to baseline. 02/15/24   Lonni Slain, MD  vitamin C  (ASCORBIC ACID ) 500 MG tablet Take 500 mg by mouth daily.    [provider]    Physical Exam: Vitals:   09/29/24 1622 09/29/24 1623 09/29/24 1929 09/29/24 2000  BP: 110/78   100/62  Pulse: (!) 128 (!) 121  (!) 126  Resp:    18  Temp:   97.8 F (36.6 C)   TempSrc:   Axillary   SpO2: 99% 95%  99%  Weight:      Height:       General exam: Alert, awake, oriented x 3; in no major distress. Respiratory system: Good air movement bilaterally; no using accessory muscles; no wheezing or crackles.  Good saturation on room air. Cardiovascular system: Irregularly irregular,  no rubs, no gallops, no JVD.  Positive murmur appreciated on exam. Gastrointestinal system: Abdomen is nondistended, soft and nontender. No organomegaly or masses felt. Normal bowel sounds heard. Central nervous system: Open 4 limbs spontaneously.  No focal neurological deficits. Extremities: No cyanosis or clubbing; 2 to-3+ edema appreciated bilaterally. Skin: No petechiae; significant redness/erythematous changes on his right lower extremity extending from his knees to his feet; positive internal/posterior aspect blister partially persisted and with serous drainage.  Psychiatry: Judgement and insight appear normal. Mood & affect appropriate.   Data Reviewed: Comprehensive metabolic panel: Sodium 119, potassium 4.6, chloride 85, bicarb 22, glucose 108, BUN 36, creatinine 1.57, AST 42, ALT 29 and GFR 41 CBC: WBCs 11.4, hemoglobin 13.4, platelet count 209K BNP: 7276 Lactic acid 2.1 Respiratory panel by PCR: Negative for COVID, influenza and RSV Urinalysis: Yellow color, clear appearance, specific gravity 1.0 11, negative ketones, negative nitrite and negative leukocyte esterase. MRSA  PCR: Detected. Magnesium 2.1 Phosphorus: 3.0   Assessment and Plan: 1-severe sepsis secondary to right lower extremity cellulitis - Patient met criteria to severe sepsis with tachycardia, elevated WBCs, elevated lactic acid, positive source of infection identified and acute kidney injury as part of organ dysfunction.   -follow Cultures results. - IV antibiotic has been started - Keep leg elevated as much as possible - Fluid resuscitation has been provided - As needed analgesics has been ordered - Follow clinical response.  2-acute kidney injury on chronic kidney disease - Stage III a at baseline - Minimize nephrotoxic agent -Provide fluid resuscitation - Follow renal function trend.  3-chronic diastolic heart failure - With most likely a component of mild exacerbation - Initially holding diuretics in the setting of acute kidney injury in the presence of severe sepsis with elevated lactic acid and requiring fluid resuscitation and readjustment resume diuretics as soon as possible - Patient will need to follow-up with cardiology service as most likely his ongoing heart failure mediated by tachyarrhythmia.  4-paroxysmal atrial fibrillation - At baseline do not use any rate control agents - Continue Eliquis  for secondary prevention - Follow electrolytes and maintain potassium above 4 and magnesium above 2 as much as possible. -might need low dose b-blocker with assistance of midodrine (BP runs low at baseline per patient; versus initiating treatment with amiodarone .  5-hyponatremia - In the setting of dehydration and component of hemodilution with fluid overload at baseline - Fluid resuscitation will be provided - Patient is completely asymptomatic - Follow electrolytes trend. - Initially holding diuretics.  6-makes hyperlipidemia - Continue statin and Lovaza .  7-history of anxiety -Continue treatment with Klonopin .  8-prior history of prostate cancer -Planning to resume the  use of Flomax  on 09/30/2024 - Continue patient follow-up with urology service.    Advance Care Planning:   Code Status: Full Code   Consults: none   Family Communication: Friend at bedside.  Severity of Illness: The appropriate patient status for this patient is INPATIENT. Inpatient status is judged to be reasonable and necessary in order to provide the required intensity of service to ensure the patient's safety. The patient's presenting symptoms, physical exam findings, and initial radiographic and laboratory data in the context of their chronic comorbidities is felt to place them at high risk for further clinical deterioration. Furthermore, it is not anticipated that the patient will be medically stable for discharge from the hospital within 2 midnights of admission.   * I certify that at the point of admission it is my clinical judgment that the patient will  require inpatient hospital care spanning beyond 2 midnights from the point of admission due to high intensity of service, high risk for further deterioration and high frequency of surveillance required.*  Author: Eric Nunnery, MD 09/29/2024 8:34 PM  For on call review [email or web address].

## 2024-09-29 NOTE — ED Provider Notes (Signed)
 [place] EMERGENCY DEPARTMENT AT [facility] Provider Note   CSN: [id number] Arrival date & time: 09/29/24  1248     Patient presents with: Leg Pain   Mark Doig. is a 89 y.o. male with past medical history of hyperlipidemia, hypertension, A-fib, congestive heart failure, CKD stage 3a presenting to ER with complaint of right lower leg pain, swelling, erythema with blister to posterior calf with is draining. Has chills at the onset of symptoms, seems to be progressing rapidly.   No associated CP, SOB, cough, abdominal pain. Has not been seen for this yet. Has not missed Eliquis . Meeting sepsis criteria with systolic of 90 with EMS, tachycardia and increased RR. Vitals improved s/p 500ns bolus.     Leg Pain      Prior to Admission medications   Medication Sig Start Date End Date Taking? Authorizing Provider  acetaminophen  (TYLENOL ) 325 MG tablet Take 2 tablets (650 mg total) by mouth every 6 (six) hours as needed for mild pain or moderate pain (or Fever >/= 101). 05/29/23   Arrien, Elidia Sieving, MD  apixaban  (ELIQUIS ) 5 MG TABS tablet Take 1 tablet (5 mg total) by mouth 2 (two) times daily. 08/15/24   Antonio Cyndee Jamee JONELLE, DO  Calcium  Carbonate-Vitamin D  600-400 MG-UNIT tablet Take 1 tablet by mouth daily.    [provider]  clonazePAM  (KLONOPIN ) 0.5 MG tablet Take 1 tablet (0.5 mg total) by mouth at bedtime. 09/04/24 01/02/25  Lowne Chase, Yvonne R, DO  cromolyn  (NASALCROM ) 5.2 MG/ACT nasal spray Place 2 sprays into both nostrils at bedtime. 06/16/24   Antonio Cyndee Jamee R, DO  fenofibrate  160 MG tablet TAKE 1 TABLET BY MOUTH EVERY DAY 07/09/24   Antonio Cyndee, Yvonne R, DO  fish oil-omega-3 fatty acids  1000 MG capsule Take 1 g by mouth 2 (two) times daily.    [provider]  latanoprost  (XALATAN ) 0.005 % ophthalmic solution Place 1 drop into the left eye at bedtime. 07/09/23   Fargo, Amy E, NP  Multiple Vitamin (THEREMS PO) Take 1 tablet by mouth daily.     [provider]  Multiple Vitamins-Minerals (PRESERVISION AREDS 2) CAPS 2 po qd 07/05/17   Antonio Cyndee, Yvonne R, DO  nystatin  cream (MYCOSTATIN ) APPLY TO AFFECTED AREA TWICE A DAY 09/19/24   Antonio Cyndee, Jamee JONELLE, DO  simvastatin  (ZOCOR ) 10 MG tablet Take 1 tablet (10 mg total) by mouth at bedtime. 04/07/24   Lowne Chase, Yvonne R, DO  spironolactone  (ALDACTONE ) 25 MG tablet TAKE 1/2 TABLET BY MOUTH EVERY DAY 04/10/24   Antonio Cyndee, Yvonne R, DO  tamsulosin  (FLOMAX ) 0.4 MG CAPS capsule Take 1 capsule (0.4 mg total) by mouth daily. 07/09/23   Fargo, Amy E, NP  torsemide  (DEMADEX ) 20 MG tablet Take 1 tablet (20 mg total) by mouth daily. In case of weight gain 2 to 3 lbs in 24 hr or 5 lbs in 7 days take 2 tablets daily until weight back to baseline. Patient taking differently: Take 10 mg by mouth daily. In case of weight gain 2 to 3 lbs in 24 hr or 5 lbs in 7 days take 2 tablets daily until weight back to baseline. 02/15/24   Lonni Slain, MD  vitamin C  (ASCORBIC ACID ) 500 MG tablet Take 500 mg by mouth daily.    [provider]    Allergies: Alpha-gal, Sulfamethoxazole, and Sulfonamide derivatives    Review of Systems  Updated Vital Signs BP 102/70   Pulse 95  Temp 97.6 F (36.4 C) (Oral)   Resp 16   Ht 5' 6 (1.676 m)   Wt 70.8 kg   SpO2 95%   BMI 25.18 kg/m   Physical Exam Vitals and nursing note reviewed.  Constitutional:      General: He is not in acute distress.    Appearance: He is not toxic-appearing.  HENT:     Head: Normocephalic and atraumatic.  Eyes:     General: No scleral icterus.    Conjunctiva/sclera: Conjunctivae normal.  Cardiovascular:     Rate and Rhythm: Normal rate and regular rhythm.     Pulses: Normal pulses.     Heart sounds: Normal heart sounds.  Pulmonary:     Effort: Pulmonary effort is normal. No respiratory distress.     Breath sounds: Normal breath sounds.  Abdominal:     General: Abdomen is flat. Bowel sounds are  normal.     Palpations: Abdomen is soft.     Tenderness: There is no abdominal tenderness.  Musculoskeletal:        General: Swelling and tenderness present.     Right lower leg: Edema present.     Left lower leg: No edema.  Skin:    General: Skin is warm and dry.     Findings: Erythema present. No lesion.  Neurological:     General: No focal deficit present.     Mental Status: He is alert and oriented to person, place, and time. Mental status is at baseline.         (all labs ordered are listed, but only abnormal results are displayed) Labs Reviewed  COMPREHENSIVE METABOLIC PANEL WITH GFR - Abnormal; Notable for the following components:      Result Value   Sodium 119 (*)    Chloride 85 (*)    Glucose, Bld 108 (*)    BUN 36 (*)    Creatinine, Ser 1.57 (*)    Total Protein 6.3 (*)    Albumin 3.4 (*)    AST 42 (*)    GFR, Estimated 41 (*)    All other components within normal limits  CBC WITH DIFFERENTIAL/PLATELET - Abnormal; Notable for the following components:   WBC 11.4 (*)    Neutro Abs 9.5 (*)    Lymphs Abs 0.6 (*)    Monocytes Absolute 1.1 (*)    Abs Immature Granulocytes 0.14 (*)    All other components within normal limits  PROTIME-INR - Abnormal; Notable for the following components:   Prothrombin Time 19.7 (*)    INR 1.6 (*)    All other components within normal limits  PRO BRAIN NATRIURETIC PEPTIDE - Abnormal; Notable for the following components:   Pro Brain Natriuretic Peptide 7,276.0 (*)    All other components within normal limits  I-STAT CG4 LACTIC ACID, ED - Abnormal; Notable for the following components:   Lactic Acid, Venous 2.1 (*)    All other components within normal limits  RESP PANEL BY RT-PCR (RSV, FLU A&B, COVID)  RVPGX2  CULTURE, BLOOD (ROUTINE X 2)  CULTURE, BLOOD (ROUTINE X 2)  URINALYSIS, W/ REFLEX TO CULTURE (INFECTION SUSPECTED)  I-STAT CG4 LACTIC ACID, ED    EKG: None  Radiology: DG Chest Port 1 View Result Date:  09/29/2024 EXAM: 1 VIEW(S) XRAY OF THE CHEST 09/29/2024 02:00:28 PM COMPARISON: 09/04/2024 CLINICAL HISTORY: Questionable sepsis - evaluate for abnormality. FINDINGS: LUNGS AND PLEURA: Chronic elevation of right hemidiaphragm. No focal pulmonary opacity. No pulmonary edema. No pleural effusion. No  pneumothorax. HEART AND MEDIASTINUM: Aortic atherosclerosis. Borderline enlargement of the cardiopericardial silhouette without edema. BONES AND SOFT TISSUES: No acute osseous abnormality. IMPRESSION: 1. No acute abnormalities. 2. Aortic atherosclerosis. 3. Chronic elevation of the right hemidiaphragm. 4. Borderline enlargement of the cardiopericardial silhouette without edema. Electronically signed by: Ryan Salvage MD 09/29/2024 02:41 PM EDT RP Workstation: [id number]     .Critical Care  Performed by: Shermon Warren SAILOR, PA-C Authorized by: Shermon Warren SAILOR, PA-C   Critical care provider statement:    Critical care time (minutes):  30   Critical care was necessary to treat or prevent imminent or life-threatening deterioration of the following conditions:  Sepsis   Critical care was time spent personally by me on the following activities:  Development of treatment plan with patient or surrogate, discussions with consultants, evaluation of patient's response to treatment, examination of patient, ordering and review of laboratory studies, ordering and review of radiographic studies, ordering and performing treatments and interventions, pulse oximetry, re-evaluation of patient's condition and review of old charts    Medications Ordered in the ED  lactated ringers infusion ( Intravenous New Bag/Given 09/29/24 1404)  apixaban  (ELIQUIS ) tablet 2.5 mg (has no administration in time range)  ascorbic acid  (VITAMIN C ) tablet 500 mg (has no administration in time range)  simvastatin  (ZOCOR ) tablet 10 mg (has no administration in time range)  latanoprost  (XALATAN ) 0.005 % ophthalmic solution 1 drop (has no  administration in time range)  fish oil-omega-3 fatty acids  capsule 1 g (has no administration in time range)  clonazePAM  (KLONOPIN ) tablet 0.5 mg (has no administration in time range)  lactated ringers bolus 500 mL (0 mLs Intravenous Stopped 09/29/24 1404)  ceFEPIme (MAXIPIME) 2 g in sodium chloride  0.9 % 100 mL IVPB (0 g Intravenous Stopped 09/29/24 1405)  metroNIDAZOLE (FLAGYL) IVPB 500 mg (0 mg Intravenous Stopped 09/29/24 1457)  vancomycin (VANCOREADY) IVPB 1500 mg/300 mL (1,500 mg Intravenous New Bag/Given 09/29/24 1340)                                    Medical Decision Making Amount and/or Complexity of Data Reviewed Labs: ordered. Radiology: ordered.  Risk Prescription drug management. Decision regarding hospitalization.   This patient presents to the ED for concern of sepsis, this involves an extensive number of treatment options, and is a complaint that carries with it a high risk of complications and morbidity.  The differential diagnosis includes cellulitis, URI, pneumonia, urinary tract infection, dehydration, electrolyte abnormality   Co morbidities that complicate the patient evaluation  Hyperlipidemia, hypertension, A-fib, congestive heart failure, CKD stage 3a   Additional history obtained:  Additional history obtained from recent echo is 05/26/2023 shows EF of 70 to 75%   Lab Tests:  I personally interpreted labs.  The pertinent results include:   CBC shows a leukocytosis of 11.4 CMP shows sodium of 119, BUN and creatinine appear about baseline for this patient Blood cultures obtained.  Initial lactic was mildly elevated at 2.1, will obtain repeat lactic BNP elevated at 7200   Imaging Studies ordered:  I ordered imaging studies including chest x-ray I independently visualized and interpreted imaging which showed no acute findings, borderline cardiomegaly with no congestion on chest x-ray. I agree with the radiologist interpretation   Cardiac  Monitoring: / EKG:  The patient was maintained on a cardiac monitor.  I personally viewed and interpreted the cardiac monitored which showed an underlying rhythm of:  A-fib RVR with rate of 111   Consultations Obtained:  I requested consultation with the hospitalist team for admission,  and discussed lab and imaging findings as well as pertinent plan.   Problem List / ED Course / Critical interventions / Medication management  On arrival patient was meeting sepsis criteria with probable source being right lower leg cellulitis.  On exam he is not having any crepitus or severe pain nor severe pain with passive range of motion of his ankle or knee joint.  He is neurovascularly intact.  He has negative Nikolsky sign.  His blood pressures were initially soft but did improve after 500 cc bolus with EMS.  I did order sepsis fluid bolus on initial presentation of patient, antibiotics and obtained blood cultures and lactic. After additional 500 cc bolus lactic had improved to 1.4.  His labs are remarkable for a leukocytosis.  Hyponatremia of 119, but no confusion, nausea vomiting or diarrhea suspect hyponatremia may be secondary to fluid overload.  His chest x-ray shows no acute findings. Given presentation, I do think DVT is less likely and patient is anticoagulated.  Patient will require IV antibiotics, admission for sepsis and hyponatremia.  Would likely benefit from repeat echo due to elevated BNP as well. I ordered medication including  Reevaluation of the patient after these medicines showed that the patient improved I have reviewed the patients home medicines and have made adjustments as needed        Final diagnoses:  Sepsis, due to unspecified organism, unspecified whether acute organ dysfunction present [facility])  Hyponatremia  Cellulitis of right leg    ED Discharge Orders     None          Shermon Warren LOISE DEVONNA 09/29/24 1748    Garrick Charleston, MD 09/30/24 1559

## 2024-09-30 DIAGNOSIS — A419 Sepsis, unspecified organism: Secondary | ICD-10-CM | POA: Diagnosis not present

## 2024-09-30 DIAGNOSIS — R6520 Severe sepsis without septic shock: Secondary | ICD-10-CM | POA: Diagnosis not present

## 2024-09-30 LAB — BASIC METABOLIC PANEL WITH GFR
Anion gap: 10 (ref 5–15)
BUN: 34 mg/dL — ABNORMAL HIGH (ref 8–23)
CO2: 22 mmol/L (ref 22–32)
Calcium: 8.1 mg/dL — ABNORMAL LOW (ref 8.9–10.3)
Chloride: 89 mmol/L — ABNORMAL LOW (ref 98–111)
Creatinine, Ser: 1.39 mg/dL — ABNORMAL HIGH (ref 0.61–1.24)
GFR, Estimated: 47 mL/min — ABNORMAL LOW (ref >60)
Glucose, Bld: 94 mg/dL (ref 70–99)
Potassium: 4.6 mmol/L (ref 3.5–5.1)
Sodium: 121 mmol/L — ABNORMAL LOW (ref 135–145)

## 2024-09-30 LAB — CBC
HCT: 34.7 % — ABNORMAL LOW (ref 39.0–52.0)
Hemoglobin: 11.9 g/dL — ABNORMAL LOW (ref 13.0–17.0)
MCH: 30.1 pg (ref 26.0–34.0)
MCHC: 34.3 g/dL (ref 30.0–36.0)
MCV: 87.6 fL (ref 80.0–100.0)
Platelets: 188 K/uL (ref 150–400)
RBC: 3.96 MIL/uL — ABNORMAL LOW (ref 4.22–5.81)
RDW: 13.9 % (ref 11.5–15.5)
WBC: 8.1 K/uL (ref 4.0–10.5)
nRBC: 0 % (ref 0.0–0.2)

## 2024-09-30 LAB — SODIUM
Sodium: 122 mmol/L — ABNORMAL LOW (ref 135–145)
Sodium: 122 mmol/L — ABNORMAL LOW (ref 135–145)

## 2024-09-30 LAB — OSMOLALITY, URINE: Osmolality, Ur: 360 mosm/kg (ref 300–900)

## 2024-09-30 MED ORDER — SODIUM CHLORIDE 1 G PO TABS
1.0000 g | ORAL_TABLET | Freq: Three times a day (TID) | ORAL | Status: DC
  Administered 2024-09-30 – 2024-10-01 (×4): 1 g via ORAL
  Filled 2024-09-30 (×4): qty 1

## 2024-09-30 NOTE — Consult Note (Addendum)
 Cardiology Consultation   Patient ID: Mark Stark. MRN: [id number]; DOB: Jan 31, 1928  Admit date: 09/29/2024 Date of Consult: 09/30/2024  PCP:  Antonio Cyndee Jamee JONELLE, DO   [facility] Providers Cardiologist:  Shelda Bruckner, MD        Patient Profile: Mark Stark. is a 89 y.o. male with a hx of permanent atrial fibrillation, atypical atrial flutter, chronic HFpEF, CKD stage 3b, moderate-severe mitral regurgitation, moderate AI, hyponatremia, dyslipidemia, prostate cancer, anxiety, UTI who is being seen 09/30/2024 for the evaluation of CHF at the request of Dr. Cheryle.  History of Present Illness: Mark Stark follows with Dr. Bruckner with complex history above. Regarding his arrhythmia history, he did not previously hold rhythm with cardioversion or amiodarone  followed by cardioversion so has been managed with rate control strategy. He has also struggled with balance between fluid, kidney and sodium levels, and blood pressure. Dr. Bruckner did raise question of potential TEE to determine if MitraClip would be beneficial, the patient preferred to avoid procedures based on prior conversations. He is not on SGLT2i due to prior UTI.   He recently saw Dr. Bruckner 09/22/24 at which time he was felt to be volume up. He had decreased his torsemide  to 1/2 tablet daily (at times no tablets) as he thought this was causing increase number of bowel movements. His torsemide  was increased back to 20mg  daily with recommendation that if weight wasn't back under 148 in 3 days, to take an extra dose BID, and to call if weight was not under 148lb within 5 days. HR 113 at OV, not referenced plan to continue Toprol  12.5mg  daily. PCP note from 03/2024 referenced that the patient stopped this due to low BP and the patient confirms he has not been on this in a long time. He carried out the diuretic plan but failed to improve so came to the [facility] reporting worsening edema along with RLE  swelling/pain/redness and a blister on his RLE that popped leaking serous fluid associated with chills as well. No fever. Workup in ED demonstrated elevated HR (similar to recent trends), lactic acidosis of 2.1, mild leukocytosis of 11.4. He was admitted for concern for sepsis in the setting of RLE cellulitis. Labs also notable for worsening of hyponatremia to 118, Cr 1.57 (baseline appears 1.4-1.6 recently), Hgb 13.4->11.9, TSH ok, pBNP 7276, BCx pending, albumin 3.4. CXR shows no acute abnormalities, +aortic atherosclerosis, chronic elevation of right hemidiaphragm, borderline enlargement of cardiopericardial silhouette without edema. He was placed on broad spectrum abx and wound care consulted for ruptured bulla in right calf area.  He was also placed on IV fluids and has order to start sodium chloride  tablets. Spironolactone  and torsemide  were held. Measured weight 158.3lb (recent OP weights in August through October have been 156lb). He denies any recent increase in baseline DOE. No resting dyspnea, orthopnea. No chest pain or palpitations. Remains in permanent AF with HR 120s-130s currently. When we speak, he uses a few medical terms like serous or T cells. He denies a medical background but reports his guilty pleasure has been watching Emergency shows on television and picking up lingo from prior hospitalizations.  This is a photo the ER took on admission 10/6:      Past Medical History:  Diagnosis Date   Acute diastolic CHF (congestive heart failure) (HCC) 10/25/2021   Anxiety    Aortic insufficiency    Atrial fibrillation (HCC)    Chronic diastolic CHF (congestive heart failure) (HCC)  Chronic kidney disease, stage 3a (HCC)    Diverticulitis    Dyslipidemia    Hypertension    Inferior mesenteric vein thrombosis    Mitral regurgitation    Nonrheumatic mitral valve regurgitation 10/28/2021   Paroxysmal atrial fibrillation (HCC) 01/12/2018   Persistent atrial fibrillation (HCC)     Prostate cancer [facility])     Past Surgical History:  Procedure Laterality Date   CARDIOVERSION N/A 03/01/2018   Procedure: CARDIOVERSION;  Surgeon: Burnard Debby LABOR, MD;  Location: [facility] ENDOSCOPY;  Service: Cardiovascular;  Laterality: N/A;   CARDIOVERSION N/A 08/26/2021   Procedure: CARDIOVERSION;  Surgeon: Mona Vinie BROCKS, MD;  Location: [facility] ENDOSCOPY;  Service: Cardiovascular;  Laterality: N/A;   CARDIOVERSION N/A 12/06/2021   Procedure: CARDIOVERSION;  Surgeon: Kate Lonni CROME, MD;  Location: [facility] ENDOSCOPY;  Service: Cardiovascular;  Laterality: N/A;   CATARACT EXTRACTION  01/27/2010   right   CATARACT EXTRACTION  03/04/2010   left   Prostate seed implants     PROSTATE SURGERY       Home Medications:  Prior to Admission medications   Medication Sig Start Date End Date Taking? Authorizing Provider  acetaminophen  (TYLENOL ) 325 MG tablet Take 2 tablets (650 mg total) by mouth every 6 (six) hours as needed for mild pain or moderate pain (or Fever >/= 101). 05/29/23   Arrien, Elidia Sieving, MD  apixaban  (ELIQUIS ) 5 MG TABS tablet Take 1 tablet (5 mg total) by mouth 2 (two) times daily. 08/15/24   Antonio Cyndee Rockers R, DO  Calcium  Carbonate-Vitamin D  600-400 MG-UNIT tablet Take 1 tablet by mouth daily.    [provider]  clonazePAM  (KLONOPIN ) 0.5 MG tablet Take 1 tablet (0.5 mg total) by mouth at bedtime. 09/04/24 01/02/25  Lowne Chase, Yvonne R, DO  cromolyn  (NASALCROM ) 5.2 MG/ACT nasal spray Place 2 sprays into both nostrils at bedtime. 06/16/24   Antonio Cyndee Rockers R, DO  fenofibrate  160 MG tablet TAKE 1 TABLET BY MOUTH EVERY DAY 07/09/24   Antonio Cyndee, Yvonne R, DO  fish oil-omega-3 fatty acids  1000 MG capsule Take 1 g by mouth 2 (two) times daily.    [provider]  latanoprost  (XALATAN ) 0.005 % ophthalmic solution Place 1 drop into the left eye at bedtime. 07/09/23   Fargo, Amy E, NP  Multiple Vitamin (THEREMS PO) Take 1 tablet by mouth daily.    [provider]   Multiple Vitamins-Minerals (PRESERVISION AREDS 2) CAPS 2 po qd 07/05/17   Antonio Cyndee, Yvonne R, DO  nystatin  cream (MYCOSTATIN ) APPLY TO AFFECTED AREA TWICE A DAY 09/19/24   Antonio Cyndee, Rockers [name], DO  simvastatin  (ZOCOR ) 10 MG tablet Take 1 tablet (10 mg total) by mouth at bedtime. 04/07/24   Antonio Cyndee Rockers R, DO  spironolactone  (ALDACTONE ) 25 MG tablet TAKE 1/2 TABLET BY MOUTH EVERY DAY 04/10/24   Antonio Cyndee, Yvonne R, DO  tamsulosin  (FLOMAX ) 0.4 MG CAPS capsule Take 1 capsule (0.4 mg total) by mouth daily. 07/09/23   Fargo, Amy E, NP  torsemide  (DEMADEX ) 20 MG tablet Take 1 tablet (20 mg total) by mouth daily. In case of weight gain 2 to 3 lbs in 24 hr or 5 lbs in 7 days take 2 tablets daily until weight back to baseline. Patient taking differently: Take 10 mg by mouth daily. In case of weight gain 2 to 3 lbs in 24 hr or 5 lbs in 7 days take 2 tablets daily until weight back to baseline. 02/15/24   Lonni Slain, MD  vitamin C  (ASCORBIC ACID ) 500 MG tablet Take 500 mg by mouth daily.    [provider]    Scheduled Meds:  apixaban   2.5 mg Oral BID   ascorbic acid   500 mg Oral Daily   Chlorhexidine Gluconate Cloth  6 each Topical Daily   clonazePAM   0.5 mg Oral QHS   latanoprost   1 drop Left Eye QHS   omega-3 acid ethyl esters  1 g Oral BID   simvastatin   10 mg Oral QHS   sodium chloride   1 g Oral TID WC   Continuous Infusions:  ceFEPime (MAXIPIME) IV 2 g (09/30/24 1046)   vancomycin     PRN Meds: acetaminophen  **OR** acetaminophen , HYDROmorphone (DILAUDID) injection, ondansetron  **OR** ondansetron  (ZOFRAN ) IV, mouth rinse  Allergies:    Allergies  Allergen Reactions   Alpha-Gal Other (See Comments)    Avoidance of meat, dairy products, and other products that contain alpha-gal. Individuals diagnosed with AGS should completely avoid the consumption of mammalian meat, including beef, pork, lamb, venison, goat, and rabbit.    Sulfamethoxazole    Sulfonamide  Derivatives Other (See Comments)    Unknown - patient cannot remember the reaction    Social History:   Social History   Socioeconomic History   Marital status: Widowed    Spouse name: Not on file   Number of children: Not on file   Years of education: Not on file   Highest education level: Not on file  Occupational History   Occupation: Professor---RETIRED    Employer: RETIRED    Comment: Retired  Tobacco Use   Smoking status: Never   Smokeless tobacco: Never  Vaping Use   Vaping status: Never Used  Substance and Sexual Activity   Alcohol use: No    Comment: Little wine occasionally    Drug use: No   Sexual activity: Never    Partners: Female  Other Topics Concern   Not on file  Social History Narrative   Not on file   Social Drivers of Health   Financial Resource Strain: Low Risk  (02/21/2024)   Overall Financial Resource Strain (CARDIA)    Difficulty of Paying Living Expenses: Not hard at all  Food Insecurity: No Food Insecurity (09/29/2024)   Hunger Vital Sign    Worried About Running Out of Food in the Last Year: Never true    Ran Out of Food in the Last Year: Never true  Transportation Needs: No Transportation Needs (09/29/2024)   PRAPARE - Administrator, Civil Service (Medical): No    Lack of Transportation (Non-Medical): No  Physical Activity: Inactive (02/21/2024)   Exercise Vital Sign    Days of Exercise per Week: 0 days    Minutes of Exercise per Session: 0 min  Stress: No Stress Concern Present (02/21/2024)   [employer] of Occupational Health - Occupational Stress Questionnaire    Feeling of Stress : Only a little  Social Connections: Moderately Isolated (09/29/2024)   Social Connection and Isolation Panel    Frequency of Communication with Friends and Family: More than three times a week    Frequency of Social Gatherings with Friends and Family: More than three times a week    Attends Religious Services: More than 4 times per year     Active Member of [employer] or Organizations: No    Attends Banker Meetings: Never    Marital Status: Widowed  Intimate Partner Violence: Not At Risk (09/29/2024)   Humiliation, Afraid, Rape, and  Kick questionnaire    Fear of Current or Ex-Partner: No    Emotionally Abused: No    Physically Abused: No    Sexually Abused: No    Family History:    Family History  Problem Relation Age of Onset   Dementia Mother    Alzheimer's disease Mother    Mental illness Mother        ALZ DISEASE   Cancer Brother 69       pancreatic cancer     ROS:  Please see the history of present illness.  All other ROS reviewed and negative.     Physical Exam/Data: Vitals:   09/30/24 0800 09/30/24 0900 09/30/24 1000 09/30/24 1100  BP: 110/88 119/80 107/75 113/77  Pulse: 67 (!) 121 (!) 138 99  Resp: (!) 23 17 17  (!) 27  Temp:      TempSrc:      SpO2: 95% 94% 96% 96%  Weight:      Height:        Intake/Output Summary (Last 24 hours) at 09/30/2024 1117 Last data filed at 09/30/2024 0930 Gross per 24 hour  Intake 4492.99 ml  Output 475 ml  Net 4017.99 ml      09/29/2024    1:03 PM 09/22/2024    2:46 PM 09/04/2024   12:53 PM  Last 3 Weights  Weight (lbs) 156 lb 156 lb 12.8 oz 156 lb 6.4 oz  Weight (kg) 70.761 kg 71.124 kg 70.943 kg     Body mass index is 25.18 kg/m.  General: Well developed, well nourished, in no acute distress. Head: Normocephalic, atraumatic, sclera non-icteric, no xanthomas, nares are without discharge. Neck: Negative for carotid bruits. JVP not elevated. Lungs: Slight decrease BS R base otherwise clear bilaterally to auscultation without wheezes, rales, or rhonchi. Breathing is unlabored. Heart: Irregularly irregular tachycardic S1 S2. Soft apical murmur. No rubs/gallops.  Abdomen: Soft, non-tender, non-distended with normoactive bowel sounds. No rebound/guarding. Extremities: No clubbing or cyanosis. Mild BLE edema with RLE erythema wrapped Neuro: Alert and  oriented X 3. Verbally astute. Moves all extremities spontaneously. Psych:  Responds to questions appropriately with a normal affect.   EKG:  The EKG was personally reviewed and demonstrates:  atrial fib 116bpm, no acute STT changes Telemetry:  Telemetry was personally reviewed and demonstrates:  Afib HR 1teens-120s  Relevant CV Studies:  2d echo 05/2023  1. Left ventricular ejection fraction, by estimation, is 70 to 75%. The  left ventricle has hyperdynamic function. The left ventricle has no  regional wall motion abnormalities. Left ventricular diastolic parameters  are indeterminate.   2. Right ventricular systolic function is normal. The right ventricular  size is normal. There is mildly elevated pulmonary artery systolic  pressure. The estimated right ventricular systolic pressure is 43.4 mmHg.   3. Left atrial size was severely dilated.   4. Right atrial size was moderately dilated.   5. The mitral valve is normal in structure. Severe mitral valve  regurgitation. No evidence of mitral stenosis.   6. The aortic valve is calcified. There is moderate calcification of the  aortic valve. There is moderate thickening of the aortic valve. Aortic  valve regurgitation is moderate. Aortic valve sclerosis/calcification is  present, without any evidence of  aortic stenosis. Aortic valve mean gradient measures 6.4 mmHg. Aortic  valve Vmax measures 1.69 m/s.   7. The inferior vena cava is dilated in size with >50% respiratory  variability, suggesting right atrial pressure of 8 mmHg.   Comparison(s): No  significant change from prior study. Prior images  reviewed side by side.   Laboratory Data: High Sensitivity Troponin:  No results for input(s): TROPONINIHS in the last 720 hours.   Chemistry Recent Labs  Lab 09/29/24 1317 09/29/24 1737 09/29/24 2054 09/30/24 0324  NA 119*  --  120* 121*  K 4.6  --  4.6 4.6  CL 85*  --  88* 89*  CO2 22  --  22 22  GLUCOSE 108*  --  122* 94  BUN  36*  --  33* 34*  CREATININE 1.57*  --  1.40* 1.39*  CALCIUM  8.9  --  8.1* 8.1*  MG  --  2.1  --   --   GFRNONAA 41*  --  47* 47*  ANIONGAP 12  --  10 10    Recent Labs  Lab 09/29/24 1317  PROT 6.3*  ALBUMIN 3.4*  AST 42*  ALT 29  ALKPHOS 92  BILITOT 1.0   Lipids No results for input(s): CHOL, TRIG, HDL, LABVLDL, LDLCALC, CHOLHDL in the last 168 hours.  Hematology Recent Labs  Lab 09/29/24 1317 09/30/24 0324  WBC 11.4* 8.1  RBC 4.63 3.96*  HGB 13.4 11.9*  HCT 41.1 34.7*  MCV 88.8 87.6  MCH 28.9 30.1  MCHC 32.6 34.3  RDW 14.0 13.9  PLT 209 188   Thyroid   Recent Labs  Lab 09/29/24 1737  TSH 1.520    BNP Recent Labs  Lab 09/29/24 1317  PROBNP 7,276.0*    DDimer No results for input(s): DDIMER in the last 168 hours.  Radiology/Studies:  DG Chest Port 1 View Result Date: 09/29/2024 EXAM: 1 VIEW(S) XRAY OF THE CHEST 09/29/2024 02:00:28 PM COMPARISON: 09/04/2024 CLINICAL HISTORY: Questionable sepsis - evaluate for abnormality. FINDINGS: LUNGS AND PLEURA: Chronic elevation of right hemidiaphragm. No focal pulmonary opacity. No pulmonary edema. No pleural effusion. No pneumothorax. HEART AND MEDIASTINUM: Aortic atherosclerosis. Borderline enlargement of the cardiopericardial silhouette without edema. BONES AND SOFT TISSUES: No acute osseous abnormality. IMPRESSION: 1. No acute abnormalities. 2. Aortic atherosclerosis. 3. Chronic elevation of the right hemidiaphragm. 4. Borderline enlargement of the cardiopericardial silhouette without edema. Electronically signed by: Ryan Salvage MD 09/29/2024 02:41 PM EDT RP Workstation: [id number]     Assessment and Plan:  1. RLE erythema, pain, bulla rupture, leukocytosis with concern for RLE cellulitis 2. Acute on chronic HFpEF 3. Permanent atrial fibrillation, here with continued RVR 4. Severe mitral regurgitation, moderate aortic regurgitation 5. Acute on chronic hyponatremia with CKD stage 3b 6. Mild  anemia  Complex case with history above. Patient has recently walked a fine line with frequent diuretic adjustment as outpatient trying to balance hyponatremia, renal function, dehydration, hypotension, along with weight gain and worsening LE edema. He has had issues with AF RVR, though previously had to stop metoprolol  due to hypotension. Agree with clinical concern for cellulitis on admission. Given complex nature of this case, will review further with MD for additional recommendations. Do not suspect IV amio would be helpful here given chronic nature of his afib. Digoxin also less ideal with CKD. I preliminarily discussed apixaban  dosing with Dr. Francyne who agrees Eliquis  2.5mg  BID dosing is appropriate for now as he tends to straddle the border of Cr dosing and has advanced age.   Risk Assessment/Risk Scores:       New York  Heart Association (NYHA) Functional Class NYHA Class II  CHA2DS2-VASc Score = 5   This indicates a 7.2% annual risk of stroke. The patient's score is based upon:  CHF History: 1 HTN History: 1 Diabetes History: 0 Stroke History: 0 Vascular Disease History: 1 Age Score: 2 Gender Score: 0        For questions or updates, please contact [facility] Please consult [email or web address] for contact info under      Signed, Dayna N Dunn, PA-C  09/30/2024 11:17 AM   I have seen and examined the patient along with Dayna N Dunn, PA-C .  I have reviewed the chart, notes and new data.  I agree with PA/NP's note.  Key new complaints: He has chronic bilateral leg edema. Unable to put on compression stockings by himself anymore. Spends most of day sitting chair, legs down. Presentation with classic chills/rigor and abrupt onset of leg redness and heat. Sounds like classic erysipelas. Denies dyspnea or chest pain Key examination changes: irregular rhythm, mild RVR (110-120), BP better (120s/70s). Clear lungs. Intense red-purple rash on R calf area, some serous  oozing. Bilateral 3+ soft pitting edema. Minimally reduced albumin. Renal function parameters moderately abnormal (at baseline) Key new findings / data: marked hyponatremia. Minimally reduced albumin. Renal function parameters moderately abnormal (at baseline), creat has been approx 1.5  PLAN: - no signs or symptoms of right heart failure at this time, but appears to be hypervolemic - ventricular rate is appropriate for acute illness; as this improves, would hope ventricular rate would be <110 bpm. If not may need to add rate control meds, but there are no great options due to lowish BP, renal dysfunction. We could add back metoprolol , but would need midodrine to prop up the BP. For now just monitor. -  the 2.5 mg dose of Eliquis  is probably safer for him, with a creatinine that is frequently >1.5 - check AM cortisol; keep off diuretics for now. Urinary Na/FeNa would be misleading with recent torsemide  and spironolactone , but can check in a couple of days if Na does not improve. Water restrict.  - keep legs elevated/compression stockings   Jerel Balding, MD, [facility] [phone] 09/30/2024, 2:01 PM

## 2024-09-30 NOTE — Progress Notes (Signed)
 PROGRESS NOTE    Mark Stark.  FMW:[id number] DOB: [birth year] DOA: 09/29/2024 PCP: Antonio Cyndee Jamee JONELLE, DO   Brief Narrative:  89 y.o. male with medical history significant of chronic diastolic heart failure, paroxysmal atrial fibrillation (on Eliquis ), chronic kidney disease stage IIIa, hypertension, dyslipidemia, hyperlipidemia, anxiety and history of prostate cancer presented with worsening right lower extremity pain, redness and swelling despite adjustment of his diuretic regimen as an outpatient as per his cardiology office.  On presentation, he was tachycardic with elevated WBCs, lactic acid and was found to have right lower extremity cellulitis.  He was started on IV fluids and antibiotics.  Assessment & Plan:   Severe sepsis: Present on admission Right lower extremity cellulitis - Presented with hypotension tachycardia, elevated WBCs, lactic acid with cellulitis - Continue broad-spectrum antibiotics.  Follow cultures.  Blood pressure intermittently on the lower side and patient is tachycardic.  DC IV fluids because of volume overload. - Leg elevation -Patient had a ruptured bulla in the right calf area: Wound care as per wound care consult recommendations  Acute on chronic diastolic heart failure -Looks to be and some volume overload with bilateral lower extremity pitting edema.  DC IV fluids.  Blood pressure on the lower side: Might need IV diuretics if blood pressure improves.  Consult cardiology.  Strict input and output.  Daily weights.  Fluid restriction  Paroxysmal A-fib with RVR -Currently remains tachycardic.  Continue Eliquis .  If continues to remain tachycardic, might need amiodarone .  Doubt that patient would tolerate beta-blocker or Cardizem.  CKD stage IIIb - Creatinine currently at baseline.  Monitor.  Leukocytosis-resolved  Acute on chronic hyponatremia - Sodium 119 on presentation.  Recent baseline of high 120s.  Will DC IV fluids.  Continue fluid  restriction.  Start salt tablets.  Monitor sodium.  Hyperlipidemia -Continue simvastatin  and Lovaza   History of anxiety -Continue Klonopin  at bedtime  History of prostate cancer -Outpatient follow-up with urology.  Resume Flomax  only if blood pressure allows    DVT prophylaxis: Eliquis  Code Status: DNR.  Discussed with patient in detail at bedside and he agrees for DNR Family Communication: None at bedside Disposition Plan: Status is: Inpatient Remains inpatient appropriate because: Of severity of illness    Consultants: Cardiology  Procedures: None  Antimicrobials: Cefepime and vancomycin from 09/29/2024 onwards   Subjective: Patient seen and examined at bedside.  Denies any current palpitations, chest pain, fever or vomiting.  Feels weak.  Objective: Vitals:   09/30/24 0404 09/30/24 0500 09/30/24 0600 09/30/24 0745  BP: 102/63 101/68 (!) 114/46   Pulse: (!) 118 (!) 133 (!) 147   Resp: (!) 21 15 18    Temp:    (!) 97.4 F (36.3 C)  TempSrc:    Oral  SpO2: 94% 93% 95%   Weight:      Height:        Intake/Output Summary (Last 24 hours) at 09/30/2024 1055 Last data filed at 09/30/2024 0930 Gross per 24 hour  Intake 4492.99 ml  Output 475 ml  Net 4017.99 ml   Filed Weights   09/29/24 1303  Weight: 70.8 kg    Examination:  General exam: Appears calm and comfortable.  Elderly man lying in bed. Respiratory system: Bilateral decreased breath sounds at bases with scattered crackles Cardiovascular system: S1 & S2 heard, tachycardic  gastrointestinal system: Abdomen is nondistended, soft and nontender. Normal bowel sounds heard. Extremities: No cyanosis, clubbing; bilateral lower extremity pitting edema present Central nervous system: Alert and oriented.  No focal neurological deficits. Moving extremities Skin: Right lower extremity is wrapped with some redness extending above the dressing psychiatry: Judgement and insight appear normal. Mood & affect appropriate.      Data Reviewed: I have personally reviewed following labs and imaging studies  CBC: Recent Labs  Lab 09/29/24 1317 09/30/24 0324  WBC 11.4* 8.1  NEUTROABS 9.5*  --   HGB 13.4 11.9*  HCT 41.1 34.7*  MCV 88.8 87.6  PLT 209 188   Basic Metabolic Panel: Recent Labs  Lab 09/29/24 1317 09/29/24 1737 09/29/24 2054 09/30/24 0324  NA 119*  --  120* 121*  K 4.6  --  4.6 4.6  CL 85*  --  88* 89*  CO2 22  --  22 22  GLUCOSE 108*  --  122* 94  BUN 36*  --  33* 34*  CREATININE 1.57*  --  1.40* 1.39*  CALCIUM  8.9  --  8.1* 8.1*  MG  --  2.1  --   --   PHOS  --  3.0  --   --    GFR: Estimated Creatinine Clearance: 29.3 mL/min (A) (by C-G formula based on SCr of 1.39 mg/dL (H)). Liver Function Tests: Recent Labs  Lab 09/29/24 1317  AST 42*  ALT 29  ALKPHOS 92  BILITOT 1.0  PROT 6.3*  ALBUMIN 3.4*   No results for input(s): LIPASE, AMYLASE in the last 168 hours. No results for input(s): AMMONIA in the last 168 hours. Coagulation Profile: Recent Labs  Lab 09/29/24 1317  INR 1.6*   Cardiac Enzymes: No results for input(s): CKTOTAL, CKMB, CKMBINDEX, TROPONINI in the last 168 hours. BNP (last 3 results) Recent Labs    09/29/24 1317  PROBNP 7,276.0*   HbA1C: No results for input(s): HGBA1C in the last 72 hours. CBG: No results for input(s): GLUCAP in the last 168 hours. Lipid Profile: No results for input(s): CHOL, HDL, LDLCALC, TRIG, CHOLHDL, LDLDIRECT in the last 72 hours. Thyroid  Function Tests: Recent Labs    09/29/24 1737  TSH 1.520   Anemia Panel: No results for input(s): VITAMINB12, FOLATE, FERRITIN, TIBC, IRON, RETICCTPCT in the last 72 hours. Sepsis Labs: Recent Labs  Lab 09/29/24 1332 09/29/24 1550  LATICACIDVEN 2.1* 1.4    Recent Results (from the past 240 hours)  Culture, blood (Routine x 2)     Status: None (Preliminary result)   Collection Time: 09/29/24  1:17 PM   Specimen: BLOOD  Result  Value Ref Range Status   Specimen Description   Final    BLOOD SITE NOT SPECIFIED Performed at [facility], [address], [place], [place] [address]    Special Requests   Final    BOTTLES DRAWN AEROBIC AND ANAEROBIC Blood Culture results may not be optimal due to an inadequate volume of blood received in culture bottles Performed at [facility], [address] [place], [place] [address]    Culture   Final    NO GROWTH < 24 HOURS Performed at [facility], [address] [place], [place] [address]    Report Status PENDING  Incomplete  Resp panel by RT-PCR (RSV, Flu A&B, Covid) Anterior Nasal Swab     Status: None   Collection Time: 09/29/24  2:21 PM   Specimen: Anterior Nasal Swab  Result Value Ref Range Status   SARS Coronavirus 2 by RT PCR NEGATIVE NEGATIVE Final    Comment: (NOTE) SARS-CoV-2 target nucleic acids are NOT DETECTED.  The  SARS-CoV-2 RNA is generally detectable in upper respiratory specimens during the acute phase of infection. The lowest concentration of SARS-CoV-2 viral copies this assay can detect is 138 copies/mL. A negative result does not preclude SARS-Cov-2 infection and should not be used as the sole basis for treatment or other patient management decisions. A negative result may occur with  improper specimen collection/handling, submission of specimen other than nasopharyngeal swab, presence of viral mutation(s) within the areas targeted by this assay, and inadequate number of viral copies(<138 copies/mL). A negative result must be combined with clinical observations, patient history, and epidemiological information. The expected result is Negative.  Fact Sheet for Patients:  BloggerCourse.com  Fact Sheet for Healthcare Providers:  SeriousBroker.it  This test is no t yet approved or cleared by the [place]  FDA and  has been authorized for detection  and/or diagnosis of SARS-CoV-2 by FDA under an Emergency Use Authorization (EUA). This EUA will remain  in effect (meaning this test can be used) for the duration of the COVID-19 declaration under Section 564(b)(1) of the Act, 21 U.S.C.section 360bbb-3(b)(1), unless the authorization is terminated  or revoked sooner.       Influenza A by PCR NEGATIVE NEGATIVE Final   Influenza B by PCR NEGATIVE NEGATIVE Final    Comment: (NOTE) The Xpert Xpress SARS-CoV-2/FLU/RSV plus assay is intended as an aid in the diagnosis of influenza from Nasopharyngeal swab specimens and should not be used as a sole basis for treatment. Nasal washings and aspirates are unacceptable for Xpert Xpress SARS-CoV-2/FLU/RSV testing.  Fact Sheet for Patients: BloggerCourse.com  Fact Sheet for Healthcare Providers: SeriousBroker.it  This test is not yet approved or cleared by the [place]  FDA and has been authorized for detection and/or diagnosis of SARS-CoV-2 by FDA under an Emergency Use Authorization (EUA). This EUA will remain in effect (meaning this test can be used) for the duration of the COVID-19 declaration under Section 564(b)(1) of the Act, 21 U.S.C. section 360bbb-3(b)(1), unless the authorization is terminated or revoked.     Resp Syncytial Virus by PCR NEGATIVE NEGATIVE Final    Comment: (NOTE) Fact Sheet for Patients: BloggerCourse.com  Fact Sheet for Healthcare Providers: SeriousBroker.it  This test is not yet approved or cleared by the [place]  FDA and has been authorized for detection and/or diagnosis of SARS-CoV-2 by FDA under an Emergency Use Authorization (EUA). This EUA will remain in effect (meaning this test can be used) for the duration of the COVID-19 declaration under Section 564(b)(1) of the Act, 21 U.S.C. section 360bbb-3(b)(1), unless the authorization is terminated  or revoked.  Performed at [facility], [address], [place], [place] [address]   MRSA Next Gen by PCR, Nasal     Status: Abnormal   Collection Time: 09/29/24  5:20 PM   Specimen: Nasal Mucosa; Nasal Swab  Result Value Ref Range Status   MRSA by PCR Next Gen DETECTED (A) NOT DETECTED Final    Comment: (NOTE) The GeneXpert MRSA Assay (FDA approved for NASAL specimens only), is one component of a comprehensive MRSA colonization surveillance program. It is not intended to diagnose MRSA infection nor to guide or monitor treatment for MRSA infections. Test performance is not FDA approved in patients less than 63 years old. Performed at [facility], [address], [place], [place] [address]   Culture, blood (Routine x 2)     Status: None (Preliminary result)   Collection Time: 09/29/24  5:37 PM   Specimen: BLOOD LEFT  HAND  Result Value Ref Range Status   Specimen Description   Final    BLOOD LEFT HAND Performed at [facility], [address] [place], [place] [address]    Special Requests   Final    BOTTLES DRAWN AEROBIC AND ANAEROBIC Blood Culture adequate volume Performed at [facility], [address], [place], [place] [address]    Culture   Final    NO GROWTH < 24 HOURS Performed at [facility], [address] [place], [place] [address]    Report Status PENDING  Incomplete         Radiology Studies: DG Chest Port 1 View Result Date: 09/29/2024 EXAM: 1 VIEW(S) XRAY OF THE CHEST 09/29/2024 02:00:28 PM COMPARISON: 09/04/2024 CLINICAL HISTORY: Questionable sepsis - evaluate for abnormality. FINDINGS: LUNGS AND PLEURA: Chronic elevation of right hemidiaphragm. No focal pulmonary opacity. No pulmonary edema. No pleural effusion. No pneumothorax. HEART AND MEDIASTINUM: Aortic atherosclerosis. Borderline enlargement of the cardiopericardial silhouette without edema. BONES AND SOFT TISSUES: No acute  osseous abnormality. IMPRESSION: 1. No acute abnormalities. 2. Aortic atherosclerosis. 3. Chronic elevation of the right hemidiaphragm. 4. Borderline enlargement of the cardiopericardial silhouette without edema. Electronically signed by: Ryan Salvage MD 09/29/2024 02:41 PM EDT RP Workstation: [id number]        Scheduled Meds:  apixaban   2.5 mg Oral BID   ascorbic acid   500 mg Oral Daily   Chlorhexidine Gluconate Cloth  6 each Topical Daily   clonazePAM   0.5 mg Oral QHS   latanoprost   1 drop Left Eye QHS   omega-3 acid ethyl esters  1 g Oral BID   simvastatin   10 mg Oral QHS   Continuous Infusions:  ceFEPime (MAXIPIME) IV 2 g (09/30/24 1046)   vancomycin            Sophie Mao, MD [facility] 09/30/2024, 10:55 AM

## 2024-09-30 NOTE — Plan of Care (Signed)
  Problem: Education: Goal: Knowledge of General Education information will improve Description: Including pain rating scale, medication(s)/side effects and non-pharmacologic comfort measures Outcome: Progressing   Problem: Nutrition: Goal: Adequate nutrition will be maintained Outcome: Progressing   Problem: Pain Managment: Goal: General experience of comfort will improve and/or be controlled Outcome: Progressing   Problem: Activity: Goal: Risk for activity intolerance will decrease Outcome: Not Progressing

## 2024-10-01 DIAGNOSIS — R6520 Severe sepsis without septic shock: Secondary | ICD-10-CM | POA: Diagnosis not present

## 2024-10-01 DIAGNOSIS — L03115 Cellulitis of right lower limb: Secondary | ICD-10-CM | POA: Diagnosis not present

## 2024-10-01 DIAGNOSIS — I480 Paroxysmal atrial fibrillation: Secondary | ICD-10-CM | POA: Diagnosis present

## 2024-10-01 DIAGNOSIS — A419 Sepsis, unspecified organism: Secondary | ICD-10-CM | POA: Diagnosis not present

## 2024-10-01 DIAGNOSIS — E871 Hypo-osmolality and hyponatremia: Secondary | ICD-10-CM | POA: Diagnosis not present

## 2024-10-01 LAB — CBC WITH DIFFERENTIAL/PLATELET
Abs Immature Granulocytes: 0.26 K/uL — ABNORMAL HIGH (ref 0.00–0.07)
Basophils Absolute: 0.1 K/uL (ref 0.0–0.1)
Basophils Relative: 1 %
Eosinophils Absolute: 0.1 K/uL (ref 0.0–0.5)
Eosinophils Relative: 2 %
HCT: 40.6 % (ref 39.0–52.0)
Hemoglobin: 13.3 g/dL (ref 13.0–17.0)
Immature Granulocytes: 3 %
Lymphocytes Relative: 11 %
Lymphs Abs: 1 K/uL (ref 0.7–4.0)
MCH: 28.9 pg (ref 26.0–34.0)
MCHC: 32.8 g/dL (ref 30.0–36.0)
MCV: 88.3 fL (ref 80.0–100.0)
Monocytes Absolute: 1.3 K/uL — ABNORMAL HIGH (ref 0.1–1.0)
Monocytes Relative: 14 %
Neutro Abs: 6.4 K/uL (ref 1.7–7.7)
Neutrophils Relative %: 69 %
Platelets: 230 K/uL (ref 150–400)
RBC: 4.6 MIL/uL (ref 4.22–5.81)
RDW: 14.2 % (ref 11.5–15.5)
WBC: 9.1 K/uL (ref 4.0–10.5)
nRBC: 0 % (ref 0.0–0.2)

## 2024-10-01 LAB — BASIC METABOLIC PANEL WITH GFR
Anion gap: 12 (ref 5–15)
BUN: 32 mg/dL — ABNORMAL HIGH (ref 8–23)
CO2: 21 mmol/L — ABNORMAL LOW (ref 22–32)
Calcium: 8.6 mg/dL — ABNORMAL LOW (ref 8.9–10.3)
Chloride: 91 mmol/L — ABNORMAL LOW (ref 98–111)
Creatinine, Ser: 1.22 mg/dL (ref 0.61–1.24)
GFR, Estimated: 55 mL/min — ABNORMAL LOW (ref >60)
Glucose, Bld: 97 mg/dL (ref 70–99)
Potassium: 4.6 mmol/L (ref 3.5–5.1)
Sodium: 123 mmol/L — ABNORMAL LOW (ref 135–145)

## 2024-10-01 LAB — C DIFFICILE QUICK SCREEN W PCR REFLEX
C Diff antigen: NEGATIVE
C Diff interpretation: NOT DETECTED
C Diff toxin: NEGATIVE

## 2024-10-01 LAB — C-REACTIVE PROTEIN: CRP: 4.9 mg/dL — ABNORMAL HIGH (ref <1.0)

## 2024-10-01 LAB — SODIUM: Sodium: 124 mmol/L — ABNORMAL LOW (ref 135–145)

## 2024-10-01 LAB — MAGNESIUM: Magnesium: 2.2 mg/dL (ref 1.7–2.4)

## 2024-10-01 MED ORDER — MUPIROCIN 2 % EX OINT
1.0000 | TOPICAL_OINTMENT | Freq: Two times a day (BID) | CUTANEOUS | Status: DC
  Administered 2024-10-01 (×2): 1 via NASAL
  Filled 2024-10-01: qty 22

## 2024-10-01 MED ORDER — SODIUM CHLORIDE 0.9 % IV SOLN
2.0000 g | Freq: Two times a day (BID) | INTRAVENOUS | Status: DC
  Administered 2024-10-01: 2 g via INTRAVENOUS
  Filled 2024-10-01: qty 12.5

## 2024-10-01 MED ORDER — VANCOMYCIN HCL 1250 MG/250ML IV SOLN
1250.0000 mg | INTRAVENOUS | Status: DC
  Filled 2024-10-01: qty 250

## 2024-10-01 MED ORDER — MELATONIN 5 MG PO TABS
5.0000 mg | ORAL_TABLET | Freq: Once | ORAL | Status: AC
  Administered 2024-10-01: 5 mg via ORAL
  Filled 2024-10-01: qty 1

## 2024-10-01 MED ORDER — METOPROLOL TARTRATE 25 MG PO TABS
25.0000 mg | ORAL_TABLET | Freq: Three times a day (TID) | ORAL | Status: DC
  Administered 2024-10-01 (×3): 25 mg via ORAL
  Filled 2024-10-01 (×3): qty 1

## 2024-10-01 NOTE — Consult Note (Signed)
 I have been asked to see the patient by Dr. Nydia Distance for evaluation and management of difficult foley placement.  History of present illness: 89 y.o. man with multiple medical problems including  chronic diastolic heart failure, paroxysmal atrial fibrillation (on Eliquis ), chronic kidney disease stage IIIa and history of prostate cancer presented with worsening right lower extremity pain/redness  found to have right lower extremity cellulitis.  He was started on IV fluids and antibiotics. Patient had elevated PVR and attempted catheter placement was unsuccessful.  Urology was called to help with catheter placed and blood at meatus after attempt.     Review of systems: A 12 point comprehensive review of systems was obtained and is negative unless otherwise stated in the history of present illness.  Patient Active Problem List   Diagnosis Date Noted   Paroxysmal atrial fibrillation with RVR (HCC) 10/01/2024   Severe sepsis (HCC) 09/29/2024   Fall 06/29/2023   Allergy to alpha-gal 06/29/2023   Peripheral neuropathy 06/29/2023   Bilateral hand pain 06/11/2023   Insomnia 06/11/2023   Pilonidal cyst 06/11/2023   Allergic reaction to alpha-gal 06/11/2023   Numbness in both hands 06/01/2023   Hyponatremia 05/25/2023   Cellulitis of right lower extremity 05/22/2023   Tick bite of right axilla with infection 05/15/2023   SOB (shortness of breath) 05/15/2023   Hematoma of left lower leg 01/05/2023   Acute on chronic diastolic CHF (congestive heart failure) (HCC) 08/24/2022   BPH (benign prostatic hyperplasia) 08/23/2022   Generalized weakness 08/22/2022   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   Diverticulitis 08/09/2022   Chronic kidney disease, stage 3a (HCC) 08/09/2022   Primary open angle glaucoma (POAG) of left eye, moderate stage 06/08/2022   Contusion of left orbit 01/05/2022   Chronic diastolic CHF (congestive heart failure) (HCC) 11/09/2021   Nonrheumatic mitral valve  regurgitation 10/28/2021   Acute kidney injury superimposed on CKD 10/28/2021   Acute diastolic CHF (congestive heart failure) (HCC) 10/25/2021   Secondary hypercoagulable state 08/23/2021   Prostate CA (HCC) 07/28/2021   Thrombocytopenia 03/30/2021   Screening for HIV (human immunodeficiency virus) 03/30/2021   Monocytosis 03/30/2021   Abnormal platelets (HCC) 03/28/2021   Hip pain 03/28/2021   Dry eyes, bilateral 05/27/2020   Epiretinal membrane (ERM) of both eyes 12/03/2019   Persistent atrial fibrillation (HCC)    Paroxysmal atrial fibrillation (HCC) 01/12/2018   Cystoid macular edema of right eye 11/29/2017   Macular degeneration 12/21/2015   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 eye with active choroidal neovascularization (HCC) 04/30/2014   Myalgia and myositis 11/16/2013   Epiretinal membrane 11/07/2012   Posterior vitreous detachment 11/07/2012   Status post intraocular lens implant 11/07/2012   Fungal ear infection 04/09/2012   Abnormal laboratory test 03/27/2011   Aortic insufficiency 03/27/2011   SYSTOLIC MURMUR 03/05/2011   EDEMA 12/05/2010   ECCHYMOSES, SPONTANEOUS 11/28/2010   CONTUSION, RIGHT THIGH 11/28/2010   DECREASED HEARING, BILATERAL 09/23/2010   Pain in limb 07/28/2009   ANKLE SPRAIN, LEFT 07/28/2009   CONTUSION, LOWER LEG, LEFT 07/28/2009   DERMATITIS, CONTACT, NOS 10/02/2007   Essential hypertension 05/07/2007   PROSTATE CANCER, HX OF 03/13/2007   Hyperlipidemia 01/15/2007   Anxiety 01/15/2007   H/O oral aphthous ulcers 01/15/2007    No current facility-administered medications on file prior to encounter.   Current Outpatient Medications on File Prior to Encounter  Medication Sig Dispense Refill   apixaban  (ELIQUIS ) 5 MG TABS tablet Take 1 tablet (5 mg total) by mouth  2 (two) times daily. 180 tablet 0   Calcium  Carbonate-Vitamin D  600-400 MG-UNIT tablet Take 1 tablet by mouth daily.     clonazePAM  (KLONOPIN ) 0.5 MG tablet Take 1  tablet (0.5 mg total) by mouth at bedtime. 30 tablet 3   cromolyn  (NASALCROM ) 5.2 MG/ACT nasal spray Place 2 sprays into both nostrils at bedtime. 26 mL 5   fenofibrate  160 MG tablet TAKE 1 TABLET BY MOUTH EVERY DAY 90 tablet 1   fish oil-omega-3 fatty acids  1000 MG capsule Take 1 g by mouth 2 (two) times daily.     latanoprost  (XALATAN ) 0.005 % ophthalmic solution Place 1 drop into the left eye at bedtime. 2.5 mL 0   Multiple Vitamins-Minerals (PRESERVISION AREDS 2) CAPS 2 po qd (Patient taking differently: Take 2 capsules by mouth daily. 2 po qd)     simvastatin  (ZOCOR ) 10 MG tablet Take 1 tablet (10 mg total) by mouth at bedtime. 90 tablet 1   spironolactone  (ALDACTONE ) 25 MG tablet TAKE 1/2 TABLET BY MOUTH EVERY DAY 45 tablet 1   tamsulosin  (FLOMAX ) 0.4 MG CAPS capsule Take 1 capsule (0.4 mg total) by mouth daily. 30 capsule 0   torsemide  (DEMADEX ) 20 MG tablet Take 1 tablet (20 mg total) by mouth daily. In case of weight gain 2 to 3 lbs in 24 hr or 5 lbs in 7 days take 2 tablets daily until weight back to baseline. (Patient taking differently: Take 10 mg by mouth daily. In case of weight gain 2 to 3 lbs in 24 hr or 5 lbs in 7 days take 2 tablets daily until weight back to baseline.) 90 tablet 3   vitamin C  (ASCORBIC ACID ) 500 MG tablet Take 500 mg by mouth daily.     acetaminophen  (TYLENOL ) 325 MG tablet Take 2 tablets (650 mg total) by mouth every 6 (six) hours as needed for mild pain or moderate pain (or Fever >/= 101). (Patient not taking: Reported on 09/30/2024)     Multiple Vitamin (THEREMS PO) Take 1 tablet by mouth daily. (Patient not taking: Reported on 09/30/2024)     nystatin  cream (MYCOSTATIN ) APPLY TO AFFECTED AREA TWICE A DAY (Patient not taking: Reported on 09/30/2024) 30 g 0    Past Medical History:  Diagnosis Date   Acute diastolic CHF (congestive heart failure) (HCC) 10/25/2021   Anxiety    Aortic insufficiency    Atrial fibrillation (HCC)    Chronic diastolic CHF (congestive  heart failure) (HCC)    Chronic kidney disease, stage 3a (HCC)    Diverticulitis    Dyslipidemia    Hypertension    Inferior mesenteric vein thrombosis    Mitral regurgitation    Nonrheumatic mitral valve regurgitation 10/28/2021   Paroxysmal atrial fibrillation (HCC) 01/12/2018   Persistent atrial fibrillation (HCC)    Prostate cancer [facility])     Past Surgical History:  Procedure Laterality Date   CARDIOVERSION N/A 03/01/2018   Procedure: CARDIOVERSION;  Surgeon: Burnard Debby LABOR, MD;  Location: [facility] ENDOSCOPY;  Service: Cardiovascular;  Laterality: N/A;   CARDIOVERSION N/A 08/26/2021   Procedure: CARDIOVERSION;  Surgeon: Mona Vinie BROCKS, MD;  Location: [facility] ENDOSCOPY;  Service: Cardiovascular;  Laterality: N/A;   CARDIOVERSION N/A 12/06/2021   Procedure: CARDIOVERSION;  Surgeon: Kate Lonni CROME, MD;  Location: [facility] ENDOSCOPY;  Service: Cardiovascular;  Laterality: N/A;   CATARACT EXTRACTION  01/27/2010   right   CATARACT EXTRACTION  03/04/2010   left   Prostate seed implants     PROSTATE SURGERY  Social History   Tobacco Use   Smoking status: Never   Smokeless tobacco: Never  Vaping Use   Vaping status: Never Used  Substance Use Topics   Alcohol use: No    Comment: Little wine occasionally    Drug use: No    Family History  Problem Relation Age of Onset   Dementia Mother    Alzheimer's disease Mother    Mental illness Mother        ALZ DISEASE   Cancer Brother 75       pancreatic cancer    PE: Vitals:   10/01/24 1200 10/01/24 1300 10/01/24 1430 10/01/24 1627  BP: (!) 127/96 114/78  127/60  Pulse: (!) 145 (!) 129 (!) 118 (!) 110  Resp: 17 16 (!) 21   Temp: (!) 97 F (36.1 C)     TempSrc: Oral     SpO2: 100% 100% 100%   Weight:      Height:       Patient appears to be in no acute distress  patient is alert and oriented x3 Atraumatic normocephalic head No cervical or supraclavicular lymphadenopathy appreciated No increased work of breathing, no  audible wheezes/rhonchi Regular sinus rhythm/rate Abdomen is soft, nontender, nondistended, no CVA or suprapubic tenderness GU: penis retracted with redundant foreskin, blood at meatus Lower extremities are symmetric without appreciable edema Grossly neurologically intact No identifiable skin lesions  Recent Labs    09/29/24 1317 09/30/24 0324 10/01/24 0310  WBC 11.4* 8.1 9.1  HGB 13.4 11.9* 13.3  HCT 41.1 34.7* 40.6   Recent Labs    09/29/24 2054 09/30/24 0324 09/30/24 1159 09/30/24 1721 09/30/24 2332 10/01/24 0310  NA 120* 121*   < > 122* 124* 123*  K 4.6 4.6  --   --   --  4.6  CL 88* 89*  --   --   --  91*  CO2 22 22  --   --   --  21*  GLUCOSE 122* 94  --   --   --  97  BUN 33* 34*  --   --   --  32*  CREATININE 1.40* 1.39*  --   --   --  1.22  CALCIUM  8.1* 8.1*  --   --   --  8.6*   < > = values in this interval not displayed.   Recent Labs    09/29/24 1317  INR 1.6*   No results for input(s): LABURIN in the last 72 hours. Results for orders placed or performed during the hospital encounter of 09/29/24  Culture, blood (Routine x 2)     Status: None (Preliminary result)   Collection Time: 09/29/24  1:17 PM   Specimen: BLOOD  Result Value Ref Range Status   Specimen Description   Final    BLOOD SITE NOT SPECIFIED Performed at [facility], [address] [place], [place] [address]    Special Requests   Final    BOTTLES DRAWN AEROBIC AND ANAEROBIC Blood Culture results may not be optimal due to an inadequate volume of blood received in culture bottles Performed at [facility], [address], [place], [place] [address]    Culture   Final    NO GROWTH 2 DAYS Performed at [facility], [address], [place], [place] [address]    Report Status PENDING  Incomplete  Resp panel by RT-PCR (RSV, Flu A&B, Covid) Anterior Nasal Swab     Status:  None   Collection Time: 09/29/24  2:21 PM   Specimen: Anterior Nasal  Swab  Result Value Ref Range Status   SARS Coronavirus 2 by RT PCR NEGATIVE NEGATIVE Final    Comment: (NOTE) SARS-CoV-2 target nucleic acids are NOT DETECTED.  The SARS-CoV-2 RNA is generally detectable in upper respiratory specimens during the acute phase of infection. The lowest concentration of SARS-CoV-2 viral copies this assay can detect is 138 copies/mL. A negative result does not preclude SARS-Cov-2 infection and should not be used as the sole basis for treatment or other patient management decisions. A negative result may occur with  improper specimen collection/handling, submission of specimen other than nasopharyngeal swab, presence of viral mutation(s) within the areas targeted by this assay, and inadequate number of viral copies(<138 copies/mL). A negative result must be combined with clinical observations, patient history, and epidemiological information. The expected result is Negative.  Fact Sheet for Patients:  BloggerCourse.com  Fact Sheet for Healthcare Providers:  SeriousBroker.it  This test is no t yet approved or cleared by the [place]  FDA and  has been authorized for detection and/or diagnosis of SARS-CoV-2 by FDA under an Emergency Use Authorization (EUA). This EUA will remain  in effect (meaning this test can be used) for the duration of the COVID-19 declaration under Section 564(b)(1) of the Act, 21 U.S.C.section 360bbb-3(b)(1), unless the authorization is terminated  or revoked sooner.       Influenza A by PCR NEGATIVE NEGATIVE Final   Influenza B by PCR NEGATIVE NEGATIVE Final    Comment: (NOTE) The Xpert Xpress SARS-CoV-2/FLU/RSV plus assay is intended as an aid in the diagnosis of influenza from Nasopharyngeal swab specimens and should not be used as a sole basis for treatment. Nasal washings and aspirates are unacceptable for Xpert Xpress SARS-CoV-2/FLU/RSV testing.  Fact Sheet for  Patients: BloggerCourse.com  Fact Sheet for Healthcare Providers: SeriousBroker.it  This test is not yet approved or cleared by the [place]  FDA and has been authorized for detection and/or diagnosis of SARS-CoV-2 by FDA under an Emergency Use Authorization (EUA). This EUA will remain in effect (meaning this test can be used) for the duration of the COVID-19 declaration under Section 564(b)(1) of the Act, 21 U.S.C. section 360bbb-3(b)(1), unless the authorization is terminated or revoked.     Resp Syncytial Virus by PCR NEGATIVE NEGATIVE Final    Comment: (NOTE) Fact Sheet for Patients: BloggerCourse.com  Fact Sheet for Healthcare Providers: SeriousBroker.it  This test is not yet approved or cleared by the [place]  FDA and has been authorized for detection and/or diagnosis of SARS-CoV-2 by FDA under an Emergency Use Authorization (EUA). This EUA will remain in effect (meaning this test can be used) for the duration of the COVID-19 declaration under Section 564(b)(1) of the Act, 21 U.S.C. section 360bbb-3(b)(1), unless the authorization is terminated or revoked.  Performed at [facility], [address] [place], [place] [address]   MRSA Next Gen by PCR, Nasal     Status: Abnormal   Collection Time: 09/29/24  5:20 PM   Specimen: Nasal Mucosa; Nasal Swab  Result Value Ref Range Status   MRSA by PCR Next Gen DETECTED (A) NOT DETECTED Final    Comment: (NOTE) The GeneXpert MRSA Assay (FDA approved for NASAL specimens only), is one component of a comprehensive MRSA colonization surveillance program. It is not intended to diagnose MRSA infection nor to guide or monitor treatment for MRSA infections. Test performance is not FDA approved in patients  less than 67 years old. Performed at [facility], [address], [place],  [place] [address]   Culture, blood (Routine x 2)     Status: None (Preliminary result)   Collection Time: 09/29/24  5:37 PM   Specimen: BLOOD LEFT HAND  Result Value Ref Range Status   Specimen Description   Final    BLOOD LEFT HAND Performed at [facility], [address], [place], [place] [address]    Special Requests   Final    BOTTLES DRAWN AEROBIC AND ANAEROBIC Blood Culture adequate volume Performed at [facility], [address], [place], [place] [address]    Culture   Final    NO GROWTH 2 DAYS Performed at [facility], [address], [place], [place] [address]    Report Status PENDING  Incomplete  C Difficile Quick Screen w PCR reflex     Status: None   Collection Time: 10/01/24  9:36 AM   Specimen: STOOL  Result Value Ref Range Status   C Diff antigen NEGATIVE NEGATIVE Final   C Diff toxin NEGATIVE NEGATIVE Final   C Diff interpretation No C. difficile detected.  Final    Comment: Performed at [facility], [address], [place], [place] [address]    Procedure:  Difficult foley placement 18Fr council tip foley  Patient was prepped and draped in usual sterile fashion.  Lidocaine  jelly was instilled into urethra.  The flexible cystoscope was placed in the urethral meatus and advanced into the bladder under direct visualization. There was a false passage seen at 6 o'clock with small amount of blood clot.  A sensor wire was advanced through the cystoscope into the bladder and the cystoscope was removed.  An 18Fr council tip foley was placed over the wire and into the bladder until return of urine was seen and it was hubbed.  The foley was inflated with 10cc sterile water.  The wire was removed and the catheter was hooked up to a drainage bag.  Clear yellow urine was seen in tubing.    Assessment/Plan: Urinary retention History of prostate cancer Difficult foley placement  -recommend patient continue foley catheter for 5-7  days to allow false passage to heal -pt reports at baseline he voids without difficulty.  If patient is still inpatient in 5-7 days, please do void trial in AM only    Jasraj Lappe D Neelie Welshans

## 2024-10-01 NOTE — Progress Notes (Addendum)
  Progress Note  Patient Name: Mark Stark. Date of Encounter: 10/01/2024 [facility] Cardiologist: Shelda Bruckner, MD  Interval Summary    Had hallucinations last night.  Remembers thinking that the nurses were witches. This appeared to happen after he received melatonin 5 mg around midnight. It happened before he received pain medication (Dilaudid) at 4 AM. He had been been taking clonazepam  to help with sleep for the last roughly 1 month and before that had taken clorazepate . Ventricular rate remains faster, 130s. BP is better. He is very fearful of receiving amiodarone  due to the side effects his wife had with that medication.  Vital Signs Vitals:   10/01/24 0600 10/01/24 0700 10/01/24 0800 10/01/24 0900  BP: (!) 125/92 (!) 129/92 134/72 (!) 139/90  Pulse: 66 (!) 132 (!) 133 (!) 116  Resp: (!) 21 20 (!) 23 (!) 21  Temp:   (!) 97.3 F (36.3 C)   TempSrc:   Oral   SpO2: 99% 99% 99% 99%  Weight:      Height:        Intake/Output Summary (Last 24 hours) at 10/01/2024 1023 Last data filed at 10/01/2024 0448 Gross per 24 hour  Intake 1611.44 ml  Output 600 ml  Net 1011.44 ml      10/01/2024    4:24 AM 09/30/2024   12:00 PM 09/29/2024    1:03 PM  Last 3 Weights  Weight (lbs) 160 lb 4.4 oz 158 lb 4.6 oz 156 lb  Weight (kg) 72.7 kg 71.8 kg 70.761 kg      Telemetry/ECG  AFib w RVR - Personally Reviewed  Physical Exam  GEN: No acute distress.   Neck: No JVD Cardiac: irregular, tachycardia, systolic murmur heard throughout the precordium, no systolic murmurs, rubs, or gallops.  Respiratory: Clear to auscultation bilaterally. GI: Soft, nontender, non-distended  MS: 2+ pitting edema and erythema of the right calf, 1+ soft pitting edema of the left calf  Assessment & Plan   Seems to be improving from the point of view of sepsis, with markedly improved blood pressure. Add metoprolol  for ventricular rate control. Will try to avoid IV amiodarone  if we  can, but I told him this might be the only logical option if conventional rate medicines are limited by hypotension. Remains moderately hyponatremic and not in any respiratory distress.  Renal function parameters a little better and at his baseline.  I think we can continue holding diuretic 1 more day.  However, he has gained roughly 4 pounds since admission and is roughly 4 pounds above his weight at his last cardiology office appointment less than 2 weeks ago.  Would be very cautious with additional administration of salt tablets. Check a.m. cortisol Avoid melatonin and any other new sedatives.  Not sure if it is best to continue the benzodiazepine, that he has been taking chronically, or try to stop it    For questions or updates, please contact [facility] Please consult [email or web address] for contact info under         Signed, Jerel Balding, MD

## 2024-10-01 NOTE — Progress Notes (Signed)
 This nurse attempted to in/out cath per Dr. Lavonne order. This nurse was unable to advance the straight cath due to resistance. Heron, RN attempted to advance catheter. At this time, dark red blood return (approximately 10 mL). Informed Dr. Davia of inability to advance catheter and request for urology consult.

## 2024-10-01 NOTE — Plan of Care (Signed)
  Problem: Clinical Measurements: Goal: Cardiovascular complication will be avoided Outcome: Not Progressing   Problem: Coping: Goal: Level of anxiety will decrease Outcome: Not Progressing   Problem: Elimination: Goal: Will not experience complications related to urinary retention Outcome: Not Progressing

## 2024-10-01 NOTE — Consult Note (Signed)
 [facility]  HISTORY AND PHYSICAL  Mark Stark. is an 89 y.o. male.    Chief Complaint: RLE swelling  HPI: Pt is a 36M with a PMH sig for HTN, Afib on Eliquis , [name], HLD, and prostate cancer who is seen at the request of Dr Davia for eval and recs re: hyponatremia.    Pt presented 09/29/24 with reports of increasing rednesf and swelling of the R leg.  Found to be septic with increased WBC ct, elevated  HR, and elevated lactic acid.  Na on admission was 119.  He received IVF, treatment for sepsis, and was also started on salt tabs 10/7.    Normally takes torsemide  at home, this has been held.    Urine osms on 10/6 showed Uosm of 360 with urine sodium < 30, Na 119 there.  TSH WNL.  Cortisol pending.  In this setting we are asked to see.  Weights have been going up for the last several days.    PMH: Past Medical History:  Diagnosis Date   Acute diastolic CHF (congestive heart failure) (HCC) 10/25/2021   Anxiety    Aortic insufficiency    Atrial fibrillation (HCC)    Chronic diastolic CHF (congestive heart failure) (HCC)    Chronic kidney disease, stage 3a (HCC)    Diverticulitis    Dyslipidemia    Hypertension    Inferior mesenteric vein thrombosis    Mitral regurgitation    Nonrheumatic mitral valve regurgitation 10/28/2021   Paroxysmal atrial fibrillation (HCC) 01/12/2018   Persistent atrial fibrillation (HCC)    Prostate cancer (HCC)    PSH: Past Surgical History:  Procedure Laterality Date   CARDIOVERSION N/A 03/01/2018   Procedure: CARDIOVERSION;  Surgeon: Burnard Debby LABOR, MD;  Location: [facility] ENDOSCOPY;  Service: Cardiovascular;  Laterality: N/A;   CARDIOVERSION N/A 08/26/2021   Procedure: CARDIOVERSION;  Surgeon: Mona Vinie BROCKS, MD;  Location: [facility] ENDOSCOPY;  Service: Cardiovascular;  Laterality: N/A;   CARDIOVERSION N/A 12/06/2021   Procedure: CARDIOVERSION;  Surgeon: Kate Lonni CROME, MD;  Location: [facility] ENDOSCOPY;  Service: Cardiovascular;  Laterality:  N/A;   CATARACT EXTRACTION  01/27/2010   right   CATARACT EXTRACTION  03/04/2010   left   Prostate seed implants     PROSTATE SURGERY      Past Medical History:  Diagnosis Date   Acute diastolic CHF (congestive heart failure) (HCC) 10/25/2021   Anxiety    Aortic insufficiency    Atrial fibrillation (HCC)    Chronic diastolic CHF (congestive heart failure) (HCC)    Chronic kidney disease, stage 3a (HCC)    Diverticulitis    Dyslipidemia    Hypertension    Inferior mesenteric vein thrombosis    Mitral regurgitation    Nonrheumatic mitral valve regurgitation 10/28/2021   Paroxysmal atrial fibrillation (HCC) 01/12/2018   Persistent atrial fibrillation (HCC)    Prostate cancer (HCC)     Medications:  Scheduled:  apixaban   2.5 mg Oral BID   ascorbic acid   500 mg Oral Daily   Chlorhexidine Gluconate Cloth  6 each Topical Daily   clonazePAM   0.5 mg Oral QHS   latanoprost   1 drop Left Eye QHS   metoprolol  tartrate  25 mg Oral TID   mupirocin ointment  1 Application Nasal BID   omega-3 acid ethyl esters  1 g Oral BID   simvastatin   10 mg Oral QHS   sodium chloride   1 g Oral TID WC    Medications Prior to Admission  Medication Sig Dispense Refill   apixaban  (ELIQUIS ) 5 MG TABS tablet Take 1 tablet (5 mg total) by mouth 2 (two) times daily. 180 tablet 0   Calcium  Carbonate-Vitamin D  600-400 MG-UNIT tablet Take 1 tablet by mouth daily.     clonazePAM  (KLONOPIN ) 0.5 MG tablet Take 1 tablet (0.5 mg total) by mouth at bedtime. 30 tablet 3   cromolyn  (NASALCROM ) 5.2 MG/ACT nasal spray Place 2 sprays into both nostrils at bedtime. 26 mL 5   fenofibrate  160 MG tablet TAKE 1 TABLET BY MOUTH EVERY DAY 90 tablet 1   fish oil-omega-3 fatty acids  1000 MG capsule Take 1 g by mouth 2 (two) times daily.     latanoprost  (XALATAN ) 0.005 % ophthalmic solution Place 1 drop into the left eye at bedtime. 2.5 mL 0   Multiple Vitamins-Minerals (PRESERVISION AREDS 2) CAPS 2 po qd (Patient taking  differently: Take 2 capsules by mouth daily. 2 po qd)     simvastatin  (ZOCOR ) 10 MG tablet Take 1 tablet (10 mg total) by mouth at bedtime. 90 tablet 1   spironolactone  (ALDACTONE ) 25 MG tablet TAKE 1/2 TABLET BY MOUTH EVERY DAY 45 tablet 1   tamsulosin  (FLOMAX ) 0.4 MG CAPS capsule Take 1 capsule (0.4 mg total) by mouth daily. 30 capsule 0   torsemide  (DEMADEX ) 20 MG tablet Take 1 tablet (20 mg total) by mouth daily. In case of weight gain 2 to 3 lbs in 24 hr or 5 lbs in 7 days take 2 tablets daily until weight back to baseline. (Patient taking differently: Take 10 mg by mouth daily. In case of weight gain 2 to 3 lbs in 24 hr or 5 lbs in 7 days take 2 tablets daily until weight back to baseline.) 90 tablet 3   vitamin C  (ASCORBIC ACID ) 500 MG tablet Take 500 mg by mouth daily.     acetaminophen  (TYLENOL ) 325 MG tablet Take 2 tablets (650 mg total) by mouth every 6 (six) hours as needed for mild pain or moderate pain (or Fever >/= 101). (Patient not taking: Reported on 09/30/2024)     Multiple Vitamin (THEREMS PO) Take 1 tablet by mouth daily. (Patient not taking: Reported on 09/30/2024)     nystatin  cream (MYCOSTATIN ) APPLY TO AFFECTED AREA TWICE A DAY (Patient not taking: Reported on 09/30/2024) 30 g 0    ALLERGIES:   Allergies  Allergen Reactions   Alpha-Gal Other (See Comments)    Avoidance of meat, dairy products, and other products that contain alpha-gal. Individuals diagnosed with AGS should completely avoid the consumption of mammalian meat, including beef, pork, lamb, venison, goat, and rabbit.    Sulfamethoxazole    Sulfonamide Derivatives Other (See Comments)    Unknown - patient cannot remember the reaction    FAM HX: Family History  Problem Relation Age of Onset   Dementia Mother    Alzheimer's disease Mother    Mental illness Mother        ALZ DISEASE   Cancer Brother 16       pancreatic cancer    Social History:   reports that he has never smoked. He has never used  smokeless tobacco. He reports that he does not drink alcohol and does not use drugs.  ROS: ROS: all other systems reviewed and are negative except as per HPI  Blood pressure 114/78, pulse (!) 118, temperature (!) 97 F (36.1 C), temperature source Oral, resp. rate (!) 21, height 5' 6 (1.676 m), weight 72.7 kg, SpO2 100%.  PHYSICAL EXAM: Physical Exam GEN older, lying in bed, NAD HEENT EOMI PERRL NECK + JVD with HJ reflux PULM clear CV irregular  ABD soft EXT 3+ pitting edema R, 2+ pitting edema L, goes all the way up to the backs of the thighs NEURO AAO x 3    Results for orders placed or performed during the hospital encounter of 09/29/24 (from the past 48 hours)  I-Stat Lactic Acid, ED     Status: None   Collection Time: 09/29/24  3:50 PM  Result Value Ref Range   Lactic Acid, Venous 1.4 0.5 - 1.9 mmol/L  MRSA Next Gen by PCR, Nasal     Status: Abnormal   Collection Time: 09/29/24  5:20 PM   Specimen: Nasal Mucosa; Nasal Swab  Result Value Ref Range   MRSA by PCR Next Gen DETECTED (A) NOT DETECTED    Comment: (NOTE) The GeneXpert MRSA Assay (FDA approved for NASAL specimens only), is one component of a comprehensive MRSA colonization surveillance program. It is not intended to diagnose MRSA infection nor to guide or monitor treatment for MRSA infections. Test performance is not FDA approved in patients less than 20 years old. Performed at [facility], [address], [place], [place] [address]   Culture, blood (Routine x 2)     Status: None (Preliminary result)   Collection Time: 09/29/24  5:37 PM   Specimen: BLOOD LEFT HAND  Result Value Ref Range   Specimen Description      BLOOD LEFT HAND Performed at [facility], [address], [place], [place] [address]    Special Requests      BOTTLES DRAWN AEROBIC AND ANAEROBIC Blood Culture adequate volume Performed at [facility], [address] [place], [place] [address]     Culture      NO GROWTH 2 DAYS Performed at [facility], [address] [place], [place] [address]    Report Status PENDING   Magnesium     Status: None   Collection Time: 09/29/24  5:37 PM  Result Value Ref Range   Magnesium 2.1 1.7 - 2.4 mg/dL    Comment: Performed at [facility], [address] [place], [place] [address]  Phosphorus     Status: None   Collection Time: 09/29/24  5:37 PM  Result Value Ref Range   Phosphorus 3.0 2.5 - 4.6 mg/dL    Comment: Performed at [facility], [address] [place], [place] [address]  TSH     Status: None   Collection Time: 09/29/24  5:37 PM  Result Value Ref Range   TSH 1.520 0.350 - 4.500 uIU/mL    Comment: Performed at [facility], [address], [place], [place] [address]  Basic metabolic panel with GFR     Status: Abnormal   Collection Time: 09/29/24  8:54 PM  Result Value Ref Range   Sodium 120 (L) 135 - 145 mmol/L   Potassium 4.6 3.5 - 5.1 mmol/L   Chloride 88 (L) 98 - 111 mmol/L   CO2 22 22 - 32 mmol/L   Glucose, Bld 122 (H) 70 - 99 mg/dL    Comment: Glucose reference range applies only to samples taken after fasting for at least 8 hours.   BUN 33 (H) 8 - 23 mg/dL   Creatinine, Ser 8.59 (H) 0.61 - 1.24 mg/dL   Calcium  8.1 (L) 8.9 - 10.3 mg/dL   GFR, Estimated 47 (  L) >60 mL/min    Comment: (NOTE) Calculated using the CKD-EPI Creatinine Equation (2021)    Anion gap 10 5 - 15    Comment: Performed at [facility], [address] [place], [place] [address]  Basic metabolic panel     Status: Abnormal   Collection Time: 09/30/24  3:24 AM  Result Value Ref Range   Sodium 121 (L) 135 - 145 mmol/L   Potassium 4.6 3.5 - 5.1 mmol/L   Chloride 89 (L) 98 - 111 mmol/L   CO2 22 22 - 32 mmol/L   Glucose, Bld 94 70 - 99 mg/dL    Comment: Glucose reference range applies only to samples taken after fasting for at least 8 hours.   BUN 34 (H) 8 - 23 mg/dL    Creatinine, Ser 8.60 (H) 0.61 - 1.24 mg/dL   Calcium  8.1 (L) 8.9 - 10.3 mg/dL   GFR, Estimated 47 (L) >60 mL/min    Comment: (NOTE) Calculated using the CKD-EPI Creatinine Equation (2021)    Anion gap 10 5 - 15    Comment: Performed at [facility], [address], [place], [place] [address]  CBC     Status: Abnormal   Collection Time: 09/30/24  3:24 AM  Result Value Ref Range   WBC 8.1 4.0 - 10.5 K/uL   RBC 3.96 (L) 4.22 - 5.81 MIL/uL   Hemoglobin 11.9 (L) 13.0 - 17.0 g/dL   HCT 65.2 (L) 60.9 - 47.9 %   MCV 87.6 80.0 - 100.0 fL   MCH 30.1 26.0 - 34.0 pg   MCHC 34.3 30.0 - 36.0 g/dL   RDW 86.0 88.4 - 84.4 %   Platelets 188 150 - 400 K/uL   nRBC 0.0 0.0 - 0.2 %    Comment: Performed at [facility], [address], [place], [place] [address]  Sodium     Status: Abnormal   Collection Time: 09/30/24 11:59 AM  Result Value Ref Range   Sodium 122 (L) 135 - 145 mmol/L    Comment: Performed at [facility], [address] [place], [place] [address]  Sodium     Status: Abnormal   Collection Time: 09/30/24  5:21 PM  Result Value Ref Range   Sodium 122 (L) 135 - 145 mmol/L    Comment: Performed at [facility], [address] [place], [place] [address]  Sodium     Status: Abnormal   Collection Time: 09/30/24 11:32 PM  Result Value Ref Range   Sodium 124 (L) 135 - 145 mmol/L    Comment: Performed at [facility], [address], [place], [place] [address]  CBC with Differential/Platelet     Status: Abnormal   Collection Time: 10/01/24  3:10 AM  Result Value Ref Range   WBC 9.1 4.0 - 10.5 K/uL   RBC 4.60 4.22 - 5.81 MIL/uL   Hemoglobin 13.3 13.0 - 17.0 g/dL   HCT 59.3 60.9 - 47.9 %   MCV 88.3 80.0 - 100.0 fL   MCH 28.9 26.0 - 34.0 pg   MCHC 32.8 30.0 - 36.0 g/dL   RDW 85.7 88.4 - 84.4 %   Platelets 230 150 - 400 K/uL   nRBC 0.0 0.0 - 0.2 %   Neutrophils Relative % 69 %   Neutro  Abs 6.4 1.7 - 7.7 K/uL   Lymphocytes Relative 11 %   Lymphs Abs 1.0 0.7 - 4.0 K/uL   Monocytes Relative  14 %   Monocytes Absolute 1.3 (H) 0.1 - 1.0 K/uL   Eosinophils Relative 2 %   Eosinophils Absolute 0.1 0.0 - 0.5 K/uL   Basophils Relative 1 %   Basophils Absolute 0.1 0.0 - 0.1 K/uL   Immature Granulocytes 3 %   Abs Immature Granulocytes 0.26 (H) 0.00 - 0.07 K/uL    Comment: Performed at [facility], [address], [place], [place] [address]  Basic metabolic panel with GFR     Status: Abnormal   Collection Time: 10/01/24  3:10 AM  Result Value Ref Range   Sodium 123 (L) 135 - 145 mmol/L   Potassium 4.6 3.5 - 5.1 mmol/L   Chloride 91 (L) 98 - 111 mmol/L   CO2 21 (L) 22 - 32 mmol/L   Glucose, Bld 97 70 - 99 mg/dL    Comment: Glucose reference range applies only to samples taken after fasting for at least 8 hours.   BUN 32 (H) 8 - 23 mg/dL   Creatinine, Ser 8.77 0.61 - 1.24 mg/dL   Calcium  8.6 (L) 8.9 - 10.3 mg/dL   GFR, Estimated 55 (L) >60 mL/min    Comment: (NOTE) Calculated using the CKD-EPI Creatinine Equation (2021)    Anion gap 12 5 - 15    Comment: Performed at [facility], [address] [place], [place] [address]  Magnesium     Status: None   Collection Time: 10/01/24  3:10 AM  Result Value Ref Range   Magnesium 2.2 1.7 - 2.4 mg/dL    Comment: Performed at [facility], [address], [place], [place] [address]  C-reactive protein     Status: Abnormal   Collection Time: 10/01/24  3:10 AM  Result Value Ref Range   CRP 4.9 (H) <1.0 mg/dL    Comment: Performed at [facility], [address] [place], [place] [address]  C Difficile Quick Screen w PCR reflex     Status: None   Collection Time: 10/01/24  9:36 AM   Specimen: STOOL  Result Value Ref Range   C Diff antigen NEGATIVE NEGATIVE   C Diff toxin NEGATIVE NEGATIVE   C Diff interpretation No C. difficile detected.     Comment: Performed at  [facility], [address], [place], [place] [address]    No results found.  Assessment/Plan  Hyponatremia:  -urine osms at admission not consistent with SIADH- combined with volume exam and history that pt was not losing weight on increased dose of torsemide , suspect hypervolemic hyponatremia.  - recommend restarting diuretics-- however with Afib this is tricky- would favor slightly better rate control to allow for optimal free water excretion/ don't want to exacerbate Afib as well  - do not think salt tabs are indicated- will stop  - fluid restriction,daily weights  - AM cortisol tomorrow  2.  Afib: looks like has been 116-145 today  - appreciate cardiology  - ON Eliquis  and metop  - considering amiodarone   3.  Severe sepsis  - RLE cellulitis  - on cefepime   4.  Dispo: admitted  GEARLINE NORRIS 10/01/2024, 3:16 PM

## 2024-10-01 NOTE — Progress Notes (Signed)
 Patient stated when he is discharged he would feel more comfortable going to rehab at [facility]. This nurse will pass this information along to primary provider and CM for continuity of care.

## 2024-10-01 NOTE — Progress Notes (Signed)
 [facility]                                                                              Mark Stark, is a 89 y.o. male, DOB - 24-Apr-1928, FMW:[id number] Admit date - 09/29/2024    Outpatient Primary MD for the patient is Mark Stark, [name], DO  LOS - 2  days  Chief Complaint  Patient presents with   Leg Pain       Brief summary   Patient is a 89 y.o. male with medical history significant of chronic diastolic heart failure, paroxysmal atrial fibrillation (on Eliquis ), chronic kidney disease stage IIIa, hypertension, dyslipidemia, hyperlipidemia, anxiety and history of prostate cancer presented with worsening right lower extremity pain, redness and swelling despite adjustment of his diuretic regimen as an outpatient as per his cardiology office.  On presentation, he was tachycardic with elevated WBCs, lactic acid and was found to have right lower extremity cellulitis.  He was started on IV fluids and antibiotics.    Assessment & Plan    Principal Problem:   Severe sepsis (HCC), POA, right lower extremity cellulitis - Presented with hypotension tachycardia, elevated WBCs, lactic acid with cellulitis - Continue IV vancomycin, cefepime, dressing changes  - Wound care following. -Patient had a ruptured bulla in the right calf area: Wound care as per recommendations   Acute on chronic diastolic heart failure - IV fluids were discontinued.  Outpatient on spironolactone , torsemide  - Cardiology following, will follow recommendations.  Continue fluid restriction, currently off diuretics.  Acute on hyponatremia - Baseline sodium between 128-133, presented with sodium of 119 - Started on salt tablets 1 g 3 times daily on 10/7, sodium 123 - Will consult nephrology, ?  Tolvaptan   Paroxysmal A-fib with RVR -Currently remains tachycardic.  - Will defer to cardiology if needs to be started on amiodarone .  Not on any rate controlling meds. - Continue eliquis     CKD stage  IIIb - Creatinine currently at baseline.  Monitor.   Leukocytosis-resolved    Hyperlipidemia -Continue simvastatin  and Lovaza    History of anxiety -Continue Klonopin  at bedtime   History of prostate cancer -Outpatient follow-up with urology.   - Resume Flomax  only if blood pressure allows    Estimated body mass index is 25.87 kg/m as calculated from the following:   Height as of this encounter: 5' 6 (1.676 m).   Weight as of this encounter: 72.7 kg.  Code Status: DNR DVT Prophylaxis:  apixaban  (ELIQUIS ) tablet 2.5 mg Start: 09/29/24 2200 apixaban  (ELIQUIS ) tablet 2.5 mg   Level of Care: Level of care: Stepdown Family Communication: Updated patient's family member at the bedside Disposition Plan:      Remains inpatient appropriate:      Procedures:    Consultants:   Cardiology Nephrology  Antimicrobials:   Anti-infectives (From admission, onward)    Start     Dose/Rate Route Frequency Ordered Stop   09/30/24 1800  vancomycin (VANCOREADY) IVPB 1250 mg/250 mL        1,250 mg 166.7 mL/hr over 90 Minutes Intravenous Every 48 hours 09/29/24 1659     09/30/24 1000  ceFEPIme (  MAXIPIME) 2 g in sodium chloride  0.9 % 100 mL IVPB        2 g 200 mL/hr over 30 Minutes Intravenous Daily 09/29/24 1659     09/29/24 1330  ceFEPIme (MAXIPIME) 2 g in sodium chloride  0.9 % 100 mL IVPB        2 g 200 mL/hr over 30 Minutes Intravenous  Once 09/29/24 1317 09/29/24 1405   09/29/24 1330  metroNIDAZOLE (FLAGYL) IVPB 500 mg        500 mg 100 mL/hr over 60 Minutes Intravenous  Once 09/29/24 1317 09/29/24 1457   09/29/24 1330  vancomycin (VANCOCIN) IVPB 1000 mg/200 mL premix  Status:  Discontinued        1,000 mg 200 mL/hr over 60 Minutes Intravenous  Once 09/29/24 1317 09/29/24 1320   09/29/24 1330  vancomycin (VANCOREADY) IVPB 1500 mg/300 mL        1,500 mg 150 mL/hr over 120 Minutes Intravenous  Once 09/29/24 1320 09/29/24 1608          Medications  apixaban   2.5 mg Oral BID    ascorbic acid   500 mg Oral Daily   Chlorhexidine Gluconate Cloth  6 each Topical Daily   clonazePAM   0.5 mg Oral QHS   latanoprost   1 drop Left Eye QHS   mupirocin ointment  1 Application Nasal BID   omega-3 acid ethyl esters  1 g Oral BID   simvastatin   10 mg Oral QHS   sodium chloride   1 g Oral TID WC      Subjective:   Mark Stark was seen and examined today.  Still has tachycardia, per family friend at the bedside, lower extremity edema has been improving.  No acute chest pain, shortness of breath, abdominal pain, nausea or vomiting.    Objective:   Vitals:   10/01/24 0600 10/01/24 0700 10/01/24 0800 10/01/24 0900  BP: (!) 125/92 (!) 129/92 134/72 (!) 139/90  Pulse: 66 (!) 132 (!) 133 (!) 116  Resp: (!) 21 20 (!) 23 (!) 21  Temp:   (!) 97.3 F (36.3 C)   TempSrc:   Oral   SpO2: 99% 99% 99% 99%  Weight:      Height:        Intake/Output Summary (Last 24 hours) at 10/01/2024 1003 Last data filed at 10/01/2024 0448 Gross per 24 hour  Intake 1611.44 ml  Output 600 ml  Net 1011.44 ml     Wt Readings from Last 3 Encounters:  10/01/24 72.7 kg  09/22/24 71.1 kg  09/04/24 70.9 kg     Exam General: Alert and oriented x 3, NAD Cardiovascular: S1 S2 auscultated,  RRR Respiratory: Clear to auscultation bilaterally, no wheezing Gastrointestinal: Soft, nontender, nondistended, + bowel sounds Ext: Right lower extremity dressing intact Neuro: No new deficits Psych: Normal affect     Data Reviewed:  I have personally reviewed following labs    CBC Lab Results  Component Value Date   WBC 9.1 10/01/2024   RBC 4.60 10/01/2024   HGB 13.3 10/01/2024   HCT 40.6 10/01/2024   MCV 88.3 10/01/2024   MCH 28.9 10/01/2024   PLT 230 10/01/2024   MCHC 32.8 10/01/2024   RDW 14.2 10/01/2024   LYMPHSABS 1.0 10/01/2024   MONOABS 1.3 (H) 10/01/2024   EOSABS 0.1 10/01/2024   BASOSABS 0.1 10/01/2024     Last metabolic panel Lab Results  Component Value Date   NA 123 (L)  10/01/2024   K 4.6 10/01/2024   CL  91 (L) 10/01/2024   CO2 21 (L) 10/01/2024   BUN 32 (H) 10/01/2024   CREATININE 1.22 10/01/2024   GLUCOSE 97 10/01/2024   GFRNONAA 55 (L) 10/01/2024   GFRAA 63 01/20/2019   CALCIUM  8.6 (L) 10/01/2024   PHOS 3.0 09/29/2024   PROT 6.3 (L) 09/29/2024   ALBUMIN 3.4 (L) 09/29/2024   BILITOT 1.0 09/29/2024   ALKPHOS 92 09/29/2024   AST 42 (H) 09/29/2024   ALT 29 09/29/2024   ANIONGAP 12 10/01/2024    CBG (last 3)  No results for input(s): GLUCAP in the last 72 hours.    Coagulation Profile: Recent Labs  Lab 09/29/24 1317  INR 1.6*     Radiology Studies: I have personally reviewed the imaging studies  DG Chest Port 1 View Result Date: 09/29/2024 EXAM: 1 VIEW(S) XRAY OF THE CHEST 09/29/2024 02:00:28 PM COMPARISON: 09/04/2024 CLINICAL HISTORY: Questionable sepsis - evaluate for abnormality. FINDINGS: LUNGS AND PLEURA: Chronic elevation of right hemidiaphragm. No focal pulmonary opacity. No pulmonary edema. No pleural effusion. No pneumothorax. HEART AND MEDIASTINUM: Aortic atherosclerosis. Borderline enlargement of the cardiopericardial silhouette without edema. BONES AND SOFT TISSUES: No acute osseous abnormality. IMPRESSION: 1. No acute abnormalities. 2. Aortic atherosclerosis. 3. Chronic elevation of the right hemidiaphragm. 4. Borderline enlargement of the cardiopericardial silhouette without edema. Electronically signed by: Ryan Salvage MD 09/29/2024 02:41 PM EDT RP Workstation: [id number]       Nydia Distance M.D. [facility] 10/01/2024, 10:03 AM  Available via Epic secure chat 7am-7pm After 7 pm, please refer to night coverage provider listed on amion.

## 2024-10-01 NOTE — Progress Notes (Signed)
 Pharmacy Antibiotic Note  Mark Stark. is a 89 y.o. male admitted on 09/29/2024 with cellulitis.  Pharmacy has been consulted for vancomycin, cefepime dosing.  Today, 10/01/24 WBC WNL, afebrile SCr improved to 1.22  Plan: Increase cefepime to 2 g IV q12h Increase vancomycin to 1250 mg IV q36h for estimated AUC of 495 Goal AUC 400-550. Check levels as needed Monitor renal function, culture data  Height: 5' 6 (167.6 cm) Weight: 72.7 kg (160 lb 4.4 oz) IBW/kg (Calculated) : 63.8  Temp (24hrs), Avg:97.5 F (36.4 C), Min:96.9 F (36.1 C), Max:98.2 F (36.8 C)  Recent Labs  Lab 09/29/24 1317 09/29/24 1332 09/29/24 1550 09/29/24 2054 09/30/24 0324 10/01/24 0310  WBC 11.4*  --   --   --  8.1 9.1  CREATININE 1.57*  --   --  1.40* 1.39* 1.22  LATICACIDVEN  --  2.1* 1.4  --   --   --     Estimated Creatinine Clearance: 33.4 mL/min (by C-G formula based on SCr of 1.22 mg/dL).    Allergies  Allergen Reactions   Alpha-Gal Other (See Comments)    Avoidance of meat, dairy products, and other products that contain alpha-gal. Individuals diagnosed with AGS should completely avoid the consumption of mammalian meat, including beef, pork, lamb, venison, goat, and rabbit.    Sulfamethoxazole    Sulfonamide Derivatives Other (See Comments)    Unknown - patient cannot remember the reaction    Antimicrobials this admission: cefepime 10/6 >>  vancomycin 10/6 >>   Dose adjustments this admission: -10/8: Vanc 1250 q48h > q36h  Microbiology results: 10/6 BCx: ngtd 10/6 MRSA PCR: Detected  Ronal CHRISTELLA Rav, PharmD 10/01/2024 10:58 AM

## 2024-10-01 NOTE — Progress Notes (Addendum)
 Temperature taken at beginning  of shift rectally reading 97.4(36.3) even with warm blankets on, reviewing patient past temperatures reveal this Am at 0800 was 97.3 orally ,1200 was 97.0 orally 1300 was 97.4(orally), 1600 was 96.4 Axillary, 1800 pm 97.3 orally, ask for order for bare hugger however provider declined and suggested warm blankets which are currently on patient, temperature in room also increased.

## 2024-10-04 LAB — CULTURE, BLOOD (ROUTINE X 2)
Culture: NO GROWTH
Culture: NO GROWTH
Special Requests: ADEQUATE

## 2024-10-07 ENCOUNTER — Ambulatory Visit: Admitting: Family Medicine

## 2024-10-12 ENCOUNTER — Other Ambulatory Visit: Payer: Self-pay | Admitting: Family Medicine

## 2024-10-12 DIAGNOSIS — I5032 Chronic diastolic (congestive) heart failure: Secondary | ICD-10-CM

## 2024-10-25 NOTE — Death Summary Note (Signed)
 DEATH SUMMARY   Patient Details  Name: Mark Stark. MRN: [id number] DOB: 08/05/1928 ERE:Ontwz Mark [name], DO Admission/Discharge Information   Admit Date:  October 17, 2024  Date of Death: Date of Death: October 20, 2024  Time of Death: Time of Death: 0320  Length of Stay: 3   Principle Cause of death: Severe sepsis   Hospital Diagnoses:    Severe sepsis (HCC)   Acute on chronic diastolic CHF (congestive heart failure) (HCC)   Cellulitis of right lower extremity Acute urinary retention   Essential hypertension   Hyperlipidemia   Hyponatremia   Paroxysmal atrial fibrillation with RVR [facility])   Hospital Course:  Patient was a 89 y.o. male with medical history significant of chronic diastolic heart failure, paroxysmal atrial fibrillation (on Eliquis ), chronic kidney disease stage IIIa, hypertension, dyslipidemia, hyperlipidemia, anxiety and history of prostate cancer presented with worsening right lower extremity pain, redness and swelling despite adjustment of his diuretic regimen as an outpatient as per his cardiology office.  On presentation, he was tachycardic with elevated WBCs, lactic acid and was found to have right lower extremity cellulitis.  He was started on IV fluids and antibiotics.    Assessment and Plan:   Severe sepsis (HCC), POA, right lower extremity cellulitis - Presented with hypotension tachycardia, elevated WBCs, lactic acid with cellulitis - Patient was placed on IV vancomycin, cefepime, IV vancomycin, cefepime, dressing changes  - Wound care was consulted. -Patient had a ruptured bulla in the right calf area, received wound care per recommendations.   Acute on chronic diastolic heart failure - IV fluids were discontinued.  Outpatient on spironolactone , torsemide  - Cardiology was consulted, recommended fluid restriction.  Off diuretics due to severe hyponatremia,   Acute on hyponatremia - Baseline sodium between 128-133, presented with sodium of 119 -  Started on salt tablets 1 g 3 times daily on 10/7, sodium 123 - Nephrology was consulted, stopped the salt tablets and recommended restarting the diuretics however tricky with atrial fibrillation with RVR   Paroxysmal A-fib with RVR - Remained tachycardiac, cardiology recommended metoprolol  for ventricular rate control and try to avoid IV amiodarone  as patient did not want to be on it on any rate controlling meds. - Continue eliquis   -Overnight, on 10-20-24 patient was noted to have sudden bradycardia, then apneic and pulseless electrical activity.  Patient DNR/DNI hence no extraordinary measures were taken.  Patient was pronounced at 3:20 AM.  Acute urinary retention -On 10/8, patient was noted to have urinary retention and difficult Foley placement due to history of prostate CA.  Urology was consulted and Dr. Elisabeth placed the Foley catheter with clear yellow urine in the tubing.   CKD stage IIIb - Creatinine at baseline.  Monitor.   Leukocytosis-resolved     Hyperlipidemia - On simvastatin  and Lovaza    History of anxiety - On Klonopin  at bedtime  History of prostate cancer -Outpatient follow-up with urology.      Estimated body mass index is 25.87 kg/m as calculated from the following:   Height as of this encounter: 5' 6 (1.676 m).   Weight as of this encounter: 72.7 kg.   Patient passed on 20-Oct-2024 at 3:20 AM      Procedures:   Consultations: Cardiology, nephrology, urology  The results of significant diagnostics from this hospitalization (including imaging, microbiology, ancillary and laboratory) are listed below for reference.   Significant Diagnostic Studies: DG Chest Port 1 View Result Date: Oct 17, 2024 EXAM: 1 VIEW(S) XRAY OF THE CHEST 17-Oct-2024 02:00:28 PM  COMPARISON: 09/04/2024 CLINICAL HISTORY: Questionable sepsis - evaluate for abnormality. FINDINGS: LUNGS AND PLEURA: Chronic elevation of right hemidiaphragm. No focal pulmonary opacity. No pulmonary edema. No  pleural effusion. No pneumothorax. HEART AND MEDIASTINUM: Aortic atherosclerosis. Borderline enlargement of the cardiopericardial silhouette without edema. BONES AND SOFT TISSUES: No acute osseous abnormality. IMPRESSION: 1. No acute abnormalities. 2. Aortic atherosclerosis. 3. Chronic elevation of the right hemidiaphragm. 4. Borderline enlargement of the cardiopericardial silhouette without edema. Electronically signed by: Ryan Salvage MD 09/29/2024 02:41 PM EDT RP Workstation: [id number]   DG Chest 2 View Result Date: 09/04/2024 CLINICAL DATA:  CHF EXAM: CHEST - 2 VIEW COMPARISON:  May 25, 2023 FINDINGS: Elevation of the right hemidiaphragm, unchanged. No focal airspace consolidation, pleural effusion, or pneumothorax. No cardiomegaly. Aortic atherosclerosis. No acute fracture or destructive lesions. Multilevel thoracic osteophytosis. IMPRESSION: No acute cardiopulmonary abnormality. Electronically Signed   By: Rogelia Myers M.D.   On: 09/04/2024 14:25    Microbiology: Recent Results (from the past 240 hours)  Culture, blood (Routine x 2)     Status: None (Preliminary result)   Collection Time: 09/29/24  1:17 PM   Specimen: BLOOD  Result Value Ref Range Status   Specimen Description   Final    BLOOD SITE NOT SPECIFIED Performed at [facility], [address] [place], [place] [address]    Special Requests   Final    BOTTLES DRAWN AEROBIC AND ANAEROBIC Blood Culture results may not be optimal due to an inadequate volume of blood received in culture bottles Performed at [facility], [address], [place], [place] [address]    Culture   Final    NO GROWTH 2 DAYS Performed at [facility], [address] [place], [place] [address]    Report Status PENDING  Incomplete  Resp panel by RT-PCR (RSV, Flu A&B, Covid) Anterior Nasal Swab     Status: None   Collection Time: 09/29/24  2:21 PM   Specimen: Anterior Nasal Swab  Result Value Ref  Range Status   SARS Coronavirus 2 by RT PCR NEGATIVE NEGATIVE Final    Comment: (NOTE) SARS-CoV-2 target nucleic acids are NOT DETECTED.  The SARS-CoV-2 RNA is generally detectable in upper respiratory specimens during the acute phase of infection. The lowest concentration of SARS-CoV-2 viral copies this assay can detect is 138 copies/mL. A negative result does not preclude SARS-Cov-2 infection and should not be used as the sole basis for treatment or other patient management decisions. A negative result may occur with  improper specimen collection/handling, submission of specimen other than nasopharyngeal swab, presence of viral mutation(s) within the areas targeted by this assay, and inadequate number of viral copies(<138 copies/mL). A negative result must be combined with clinical observations, patient history, and epidemiological information. The expected result is Negative.  Fact Sheet for Patients:  BloggerCourse.com  Fact Sheet for Healthcare Providers:  SeriousBroker.it  This test is no t yet approved or cleared by the [place]  FDA and  has been authorized for detection and/or diagnosis of SARS-CoV-2 by FDA under an Emergency Use Authorization (EUA). This EUA will remain  in effect (meaning this test can be used) for the duration of the COVID-19 declaration under Section 564(b)(1) of the Act, 21 U.S.C.section 360bbb-3(b)(1), unless the authorization is terminated  or revoked sooner.       Influenza A by PCR NEGATIVE NEGATIVE Final   Influenza B by PCR NEGATIVE NEGATIVE Final    Comment: (NOTE) The Xpert Xpress  SARS-CoV-2/FLU/RSV plus assay is intended as an aid in the diagnosis of influenza from Nasopharyngeal swab specimens and should not be used as a sole basis for treatment. Nasal washings and aspirates are unacceptable for Xpert Xpress SARS-CoV-2/FLU/RSV testing.  Fact Sheet for  Patients: BloggerCourse.com  Fact Sheet for Healthcare Providers: SeriousBroker.it  This test is not yet approved or cleared by the [place]  FDA and has been authorized for detection and/or diagnosis of SARS-CoV-2 by FDA under an Emergency Use Authorization (EUA). This EUA will remain in effect (meaning this test can be used) for the duration of the COVID-19 declaration under Section 564(b)(1) of the Act, 21 U.S.C. section 360bbb-3(b)(1), unless the authorization is terminated or revoked.     Resp Syncytial Virus by PCR NEGATIVE NEGATIVE Final    Comment: (NOTE) Fact Sheet for Patients: BloggerCourse.com  Fact Sheet for Healthcare Providers: SeriousBroker.it  This test is not yet approved or cleared by the [place]  FDA and has been authorized for detection and/or diagnosis of SARS-CoV-2 by FDA under an Emergency Use Authorization (EUA). This EUA will remain in effect (meaning this test can be used) for the duration of the COVID-19 declaration under Section 564(b)(1) of the Act, 21 U.S.C. section 360bbb-3(b)(1), unless the authorization is terminated or revoked.  Performed at [facility], [address], [place], [place] [address]   MRSA Next Gen by PCR, Nasal     Status: Abnormal   Collection Time: 09/29/24  5:20 PM   Specimen: Nasal Mucosa; Nasal Swab  Result Value Ref Range Status   MRSA by PCR Next Gen DETECTED (A) NOT DETECTED Final    Comment: (NOTE) The GeneXpert MRSA Assay (FDA approved for NASAL specimens only), is one component of a comprehensive MRSA colonization surveillance program. It is not intended to diagnose MRSA infection nor to guide or monitor treatment for MRSA infections. Test performance is not FDA approved in patients less than 62 years old. Performed at [facility], [address], [place],  [place] [address]   Culture, blood (Routine x 2)     Status: None (Preliminary result)   Collection Time: 09/29/24  5:37 PM   Specimen: BLOOD LEFT HAND  Result Value Ref Range Status   Specimen Description   Final    BLOOD LEFT HAND Performed at [facility], [address] [place], [place] [address]    Special Requests   Final    BOTTLES DRAWN AEROBIC AND ANAEROBIC Blood Culture adequate volume Performed at [facility], [address], [place], [place] [address]    Culture   Final    NO GROWTH 2 DAYS Performed at [facility], [address] [place], [place] [address]    Report Status PENDING  Incomplete  C Difficile Quick Screen w PCR reflex     Status: None   Collection Time: 10/01/24  9:36 AM   Specimen: STOOL  Result Value Ref Range Status   C Diff antigen NEGATIVE NEGATIVE Final   C Diff toxin NEGATIVE NEGATIVE Final   C Diff interpretation No C. difficile detected.  Final    Comment: Performed at [facility], [address], [place], [place] [address]      Signed: Nydia Distance, MD 2024-10-19

## 2024-10-25 NOTE — Plan of Care (Signed)
 This shift patients temperature has been in the 97.1-97.4 rage throughout the day, warm blankets applied, last rectal temp was 98.5, patient more confused and anxious along with agitated at times, right lower extremity dressing intact, dressing to be changed daily, patient placed on 4 liters of oxygen due to drop in his oxygen levels. Plan of care and goals reviewed with time given for questions, patient handbook at bedside, bed in lowest locked position with call bell in reach, bed alarm on, side rails up and mat on the floor. Patient has poor po intake however remains on 1500 ml fluid restriction, bilateral lower extremities have plus 2 edema noted. Foley catheter was placed the previous shift, remains patent.

## 2024-10-25 NOTE — Progress Notes (Signed)
     Patient Name: Mark Stark.           DOB: [birth year]  MRN: [id number]      Admission Date: 09/29/2024  Attending Provider: Davia Nydia POUR, MD  Primary Diagnosis: Severe sepsis (HCC)   Level of care: Stepdown   OVERNIGHT EVENT   Notified by nursing staff of patient having sudden bradycardia.   At bedside, patient is noted to be apneic and in pulseless electrical activity. Patient is DNR/DNI.  No extraordinary measures to take place given this code status.  Time of death 13  I attempted to call [facility] and [facility], but was unsuccessful.  Nursing staff was made aware that family/friends are unaware patient has expired.  Nursing staff will also attempt to reach [facility] family. I will try again as well.    Ozan Maclay, DNP, ACNPC- AG [facility] [place]

## 2024-10-25 DEATH — deceased

## 2024-11-26 ENCOUNTER — Ambulatory Visit (HOSPITAL_BASED_OUTPATIENT_CLINIC_OR_DEPARTMENT_OTHER): Admitting: Cardiology

## 2024-12-14 ENCOUNTER — Other Ambulatory Visit: Payer: Self-pay | Admitting: Family Medicine

## 2024-12-14 DIAGNOSIS — I5032 Chronic diastolic (congestive) heart failure: Secondary | ICD-10-CM
